# Patient Record
Sex: Female | Born: 1955
Health system: Southern US, Community
[De-identification: ages and names within clinical notes are randomized; demographics above are authoritative.]

## PROBLEM LIST (undated history)

## (undated) ENCOUNTER — Emergency Department (HOSPITAL_COMMUNITY): Admission: EM | Payer: BC Managed Care – PPO | Source: Home / Self Care

## (undated) DIAGNOSIS — I2510 Atherosclerotic heart disease of native coronary artery without angina pectoris: Secondary | ICD-10-CM

## (undated) DIAGNOSIS — I219 Acute myocardial infarction, unspecified: Secondary | ICD-10-CM

## (undated) DIAGNOSIS — I2699 Other pulmonary embolism without acute cor pulmonale: Secondary | ICD-10-CM

## (undated) DIAGNOSIS — I10 Essential (primary) hypertension: Secondary | ICD-10-CM

## (undated) DIAGNOSIS — I214 Non-ST elevation (NSTEMI) myocardial infarction: Secondary | ICD-10-CM

## (undated) DIAGNOSIS — D6862 Lupus anticoagulant syndrome: Secondary | ICD-10-CM

## (undated) DIAGNOSIS — M549 Dorsalgia, unspecified: Secondary | ICD-10-CM

## (undated) DIAGNOSIS — I82409 Acute embolism and thrombosis of unspecified deep veins of unspecified lower extremity: Secondary | ICD-10-CM

## (undated) HISTORY — PX: BACK SURGERY: SHX140

## (undated) HISTORY — DX: Acute myocardial infarction, unspecified: I21.9

---
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
## 2002-05-08 ENCOUNTER — Encounter: Payer: Self-pay | Admitting: Family Medicine

## 2009-08-16 ENCOUNTER — Encounter: Payer: Self-pay | Admitting: Family Medicine

## 2009-09-06 ENCOUNTER — Encounter
Admission: RE | Admit: 2009-09-06 | Discharge: 2009-12-05 | Payer: Self-pay | Admitting: Physical Medicine and Rehabilitation

## 2010-03-21 ENCOUNTER — Ambulatory Visit (HOSPITAL_COMMUNITY): Admission: RE | Admit: 2010-03-21 | Discharge: 2010-03-21 | Payer: Self-pay | Admitting: Ophthalmology

## 2010-11-01 LAB — BASIC METABOLIC PANEL
BUN: 8 mg/dL (ref 6–23)
CO2: 25 mEq/L (ref 19–32)
Calcium: 9.6 mg/dL (ref 8.4–10.5)
Chloride: 105 mEq/L (ref 96–112)
Creatinine, Ser: 0.55 mg/dL (ref 0.4–1.2)
GFR calc Af Amer: >60 mL/min (ref >60)
GFR calc non Af Amer: >60 mL/min (ref >60)
Glucose, Bld: 98 mg/dL (ref 70–99)
Potassium: 3.3 mEq/L — ABNORMAL LOW (ref 3.5–5.1)
Sodium: 137 mEq/L (ref 135–145)

## 2010-11-01 LAB — HEMOGLOBIN AND HEMATOCRIT, BLOOD
HCT: 36.8 % (ref 36.0–46.0)
Hemoglobin: 12.9 g/dL (ref 12.0–15.0)

## 2011-06-11 ENCOUNTER — Ambulatory Visit: Payer: BC Managed Care – PPO | Attending: Physical Medicine and Rehabilitation | Admitting: Physical Therapy

## 2011-06-11 DIAGNOSIS — M545 Low back pain, unspecified: Secondary | ICD-10-CM | POA: Insufficient documentation

## 2011-06-11 DIAGNOSIS — IMO0001 Reserved for inherently not codable concepts without codable children: Secondary | ICD-10-CM | POA: Insufficient documentation

## 2011-06-11 DIAGNOSIS — R5381 Other malaise: Secondary | ICD-10-CM | POA: Insufficient documentation

## 2011-06-16 ENCOUNTER — Ambulatory Visit: Payer: BC Managed Care – PPO | Admitting: Physical Therapy

## 2011-06-18 ENCOUNTER — Encounter: Payer: BC Managed Care – PPO | Admitting: Physical Therapy

## 2012-04-20 ENCOUNTER — Encounter (HOSPITAL_COMMUNITY): Payer: Self-pay | Admitting: Emergency Medicine

## 2012-04-20 ENCOUNTER — Emergency Department (HOSPITAL_COMMUNITY): Payer: BC Managed Care – PPO

## 2012-04-20 ENCOUNTER — Inpatient Hospital Stay (HOSPITAL_COMMUNITY)
Admission: EM | Admit: 2012-04-20 | Discharge: 2012-04-23 | DRG: 543 | Disposition: A | Discharge status: Home / Self Care | Payer: BC Managed Care – PPO | Attending: Internal Medicine | Admitting: Internal Medicine

## 2012-04-20 DIAGNOSIS — I2699 Other pulmonary embolism without acute cor pulmonale: Secondary | ICD-10-CM

## 2012-04-20 DIAGNOSIS — E860 Dehydration: Secondary | ICD-10-CM | POA: Diagnosis present

## 2012-04-20 DIAGNOSIS — I498 Other specified cardiac arrhythmias: Secondary | ICD-10-CM | POA: Diagnosis present

## 2012-04-20 DIAGNOSIS — R000 Tachycardia, unspecified: Secondary | ICD-10-CM | POA: Diagnosis present

## 2012-04-20 DIAGNOSIS — D649 Anemia, unspecified: Secondary | ICD-10-CM

## 2012-04-20 DIAGNOSIS — M7989 Other specified soft tissue disorders: Secondary | ICD-10-CM

## 2012-04-20 DIAGNOSIS — Z9889 Other specified postprocedural states: Secondary | ICD-10-CM

## 2012-04-20 DIAGNOSIS — I10 Essential (primary) hypertension: Secondary | ICD-10-CM | POA: Diagnosis present

## 2012-04-20 DIAGNOSIS — I824Z9 Acute embolism and thrombosis of unspecified deep veins of unspecified distal lower extremity: Principal | ICD-10-CM | POA: Diagnosis present

## 2012-04-20 DIAGNOSIS — F17200 Nicotine dependence, unspecified, uncomplicated: Secondary | ICD-10-CM | POA: Diagnosis present

## 2012-04-20 DIAGNOSIS — I82409 Acute embolism and thrombosis of unspecified deep veins of unspecified lower extremity: Secondary | ICD-10-CM

## 2012-04-20 DIAGNOSIS — E871 Hypo-osmolality and hyponatremia: Secondary | ICD-10-CM | POA: Diagnosis present

## 2012-04-20 DIAGNOSIS — Z79899 Other long term (current) drug therapy: Secondary | ICD-10-CM

## 2012-04-20 DIAGNOSIS — E876 Hypokalemia: Secondary | ICD-10-CM

## 2012-04-20 DIAGNOSIS — M79609 Pain in unspecified limb: Secondary | ICD-10-CM

## 2012-04-20 DIAGNOSIS — Z720 Tobacco use: Secondary | ICD-10-CM | POA: Diagnosis present

## 2012-04-20 DIAGNOSIS — D72829 Elevated white blood cell count, unspecified: Secondary | ICD-10-CM | POA: Diagnosis present

## 2012-04-20 DIAGNOSIS — N179 Acute kidney failure, unspecified: Secondary | ICD-10-CM

## 2012-04-20 LAB — GLUCOSE, CAPILLARY: Glucose-Capillary: 103 mg/dL — ABNORMAL HIGH (ref 70–99)

## 2012-04-20 LAB — BASIC METABOLIC PANEL
BUN: 14 mg/dL (ref 6–23)
CO2: 28 mEq/L (ref 19–32)
Calcium: 9.2 mg/dL (ref 8.4–10.5)
Chloride: 84 mEq/L — ABNORMAL LOW (ref 96–112)
Creatinine, Ser: 1.5 mg/dL — ABNORMAL HIGH (ref 0.50–1.10)
GFR calc Af Amer: 44 mL/min — ABNORMAL LOW (ref >90)
GFR calc non Af Amer: 38 mL/min — ABNORMAL LOW (ref >90)
Glucose, Bld: 128 mg/dL — ABNORMAL HIGH (ref 70–99)
Potassium: 2.8 mEq/L — ABNORMAL LOW (ref 3.5–5.1)
Sodium: 126 mEq/L — ABNORMAL LOW (ref 135–145)

## 2012-04-20 LAB — CBC WITH DIFFERENTIAL/PLATELET
Basophils Absolute: 0 10*3/uL (ref 0.0–0.1)
Basophils Relative: 0 % (ref 0–1)
Eosinophils Absolute: 0 10*3/uL (ref 0.0–0.7)
Eosinophils Relative: 0 % (ref 0–5)
HCT: 26 % — ABNORMAL LOW (ref 36.0–46.0)
Hemoglobin: 9 g/dL — ABNORMAL LOW (ref 12.0–15.0)
Lymphocytes Relative: 6 % — ABNORMAL LOW (ref 12–46)
Lymphs Abs: 1.2 10*3/uL (ref 0.7–4.0)
MCH: 29.5 pg (ref 26.0–34.0)
MCHC: 34.6 g/dL (ref 30.0–36.0)
MCV: 85.2 fL (ref 78.0–100.0)
Monocytes Absolute: 1.8 10*3/uL — ABNORMAL HIGH (ref 0.1–1.0)
Monocytes Relative: 8 % (ref 3–12)
Neutro Abs: 18.6 10*3/uL — ABNORMAL HIGH (ref 1.7–7.7)
Neutrophils Relative %: 86 % — ABNORMAL HIGH (ref 43–77)
Platelets: 592 10*3/uL — ABNORMAL HIGH (ref 150–400)
RBC: 3.05 MIL/uL — ABNORMAL LOW (ref 3.87–5.11)
RDW: 12.9 % (ref 11.5–15.5)
WBC: 21.6 10*3/uL — ABNORMAL HIGH (ref 4.0–10.5)

## 2012-04-20 LAB — PROTIME-INR
INR: 1.29 (ref 0.00–1.49)
Prothrombin Time: 16.3 seconds — ABNORMAL HIGH (ref 11.6–15.2)

## 2012-04-20 LAB — MRSA PCR SCREENING: MRSA by PCR: NEGATIVE

## 2012-04-20 MED ORDER — SODIUM CHLORIDE 0.9 % IV SOLN
INTRAVENOUS | Status: AC
  Administered 2012-04-20: 23:00:00 via INTRAVENOUS

## 2012-04-20 MED ORDER — SODIUM CHLORIDE 0.9 % IV BOLUS (SEPSIS)
1000.0000 mL | Freq: Once | INTRAVENOUS | Status: AC
  Administered 2012-04-20: 1000 mL via INTRAVENOUS

## 2012-04-20 MED ORDER — OXYCODONE HCL 5 MG PO TABS
5.0000 mg | ORAL_TABLET | ORAL | Status: DC | PRN
  Administered 2012-04-20 – 2012-04-22 (×6): 5 mg via ORAL
  Filled 2012-04-20 (×6): qty 1

## 2012-04-20 MED ORDER — HEPARIN BOLUS VIA INFUSION
4000.0000 [IU] | Freq: Once | INTRAVENOUS | Status: AC
  Administered 2012-04-20: 4000 [IU] via INTRAVENOUS

## 2012-04-20 MED ORDER — SODIUM CHLORIDE 0.9 % IV SOLN
INTRAVENOUS | Status: DC
  Administered 2012-04-20 – 2012-04-22 (×6): via INTRAVENOUS

## 2012-04-20 MED ORDER — IOHEXOL 350 MG/ML SOLN
80.0000 mL | Freq: Once | INTRAVENOUS | Status: AC | PRN
  Administered 2012-04-20: 80 mL via INTRAVENOUS

## 2012-04-20 MED ORDER — ACETAMINOPHEN 650 MG RE SUPP: 650.0000 mg | Freq: Four times a day (QID) | RECTAL | Status: DC | PRN

## 2012-04-20 MED ORDER — ONDANSETRON HCL 4 MG/2ML IJ SOLN: 4.0000 mg | Freq: Four times a day (QID) | INTRAMUSCULAR | Status: DC | PRN

## 2012-04-20 MED ORDER — OXYCODONE HCL 5 MG PO CAPS: 5.0000 mg | ORAL_CAPSULE | ORAL | Status: DC | PRN

## 2012-04-20 MED ORDER — SODIUM CHLORIDE 0.9 % IJ SOLN
3.0000 mL | Freq: Two times a day (BID) | INTRAMUSCULAR | Status: DC
  Administered 2012-04-20 – 2012-04-22 (×3): 3 mL via INTRAVENOUS

## 2012-04-20 MED ORDER — HEPARIN (PORCINE) IN NACL 100-0.45 UNIT/ML-% IJ SOLN
1700.0000 [IU]/h | INTRAMUSCULAR | Status: DC
  Administered 2012-04-20: 1100 [IU]/h via INTRAVENOUS
  Administered 2012-04-21: 1400 [IU]/h via INTRAVENOUS
  Administered 2012-04-21: 1700 [IU]/h via INTRAVENOUS
  Filled 2012-04-20 (×3): qty 250

## 2012-04-20 MED ORDER — SENNOSIDES-DOCUSATE SODIUM 8.6-50 MG PO TABS
2.0000 | ORAL_TABLET | Freq: Every day | ORAL | Status: DC
  Administered 2012-04-20 – 2012-04-22 (×3): 2 via ORAL
  Filled 2012-04-20 (×5): qty 2

## 2012-04-20 MED ORDER — CYCLOBENZAPRINE HCL 10 MG PO TABS
10.0000 mg | ORAL_TABLET | Freq: Three times a day (TID) | ORAL | Status: DC
  Administered 2012-04-20 – 2012-04-23 (×9): 10 mg via ORAL
  Filled 2012-04-20 (×11): qty 1

## 2012-04-20 MED ORDER — POTASSIUM CHLORIDE 10 MEQ/100ML IV SOLN
10.0000 meq | Freq: Once | INTRAVENOUS | Status: AC
  Administered 2012-04-20: 10 meq via INTRAVENOUS
  Filled 2012-04-20: qty 100

## 2012-04-20 MED ORDER — POTASSIUM CHLORIDE CRYS ER 20 MEQ PO TBCR
40.0000 meq | EXTENDED_RELEASE_TABLET | Freq: Once | ORAL | Status: AC
  Administered 2012-04-20: 40 meq via ORAL
  Filled 2012-04-20: qty 2

## 2012-04-20 MED ORDER — POTASSIUM CHLORIDE 10 MEQ/100ML IV SOLN
10.0000 meq | INTRAVENOUS | Status: AC
  Administered 2012-04-20 – 2012-04-21 (×3): 10 meq via INTRAVENOUS
  Filled 2012-04-20 (×3): qty 100

## 2012-04-20 MED ORDER — ACETAMINOPHEN 325 MG PO TABS: 650.0000 mg | ORAL_TABLET | Freq: Four times a day (QID) | ORAL | Status: DC | PRN

## 2012-04-20 MED ORDER — ONDANSETRON HCL 4 MG PO TABS: 4.0000 mg | ORAL_TABLET | Freq: Four times a day (QID) | ORAL | Status: DC | PRN

## 2012-04-20 MED ORDER — SENNA-DOCUSATE SODIUM 8.6-50 MG PO TABS: 2.0000 | ORAL_TABLET | Freq: Every evening | ORAL | Status: DC

## 2012-04-20 NOTE — H&P (Signed)
[name] [name] is an 56 y.o. female.  Patient was seen and examined on April 20, 2012 at 10 PM. PCP - Dr. Rudi Heap in [place].  Chief Complaint: Left lower extremity swelling. HPI: 56 year old female and history of ongoing tobacco abuse and hypertension who had recent back surgery at [facility] on April 05, 2012 and had her staples removed today was found to have left lower extremity edema and was told to come to the ER. In the ER doc as confirmed DVT and since patient had tachycardia CT angiogram was done which showed pulmonary embolism. Patient is admitted for further workup. In addition patient is also found to be hyponatremic and hypokalemic with increased creatinine. Patient states she has not been eating well for last few days since the surgery but denies any nausea vomiting abdominal pain or diarrhea. Since her surgery she has been ambulating less. Denies any hormone use or any family history of PE or DVT. Patient initially was found to be hypotensive but soon became normotensive with fluids.  Past Medical History  Diagnosis Date  . No pertinent past medical history     Past Surgical History  Procedure Date  . Back surgery     Family History  Problem Relation Age of Onset  . Leukemia Mother   . Prostate cancer Father    Social History:  reports that she has been smoking.  She does not have any smokeless tobacco history on file. She reports that she drinks alcohol. She reports that she does not use illicit drugs.  Allergies:  Allergies  Allergen Reactions  . Prednisone Other (See Comments)    No energy "stalls out"  . Tramadol Other (See Comments)    constipation    Medications Prior to Admission  Medication Sig Dispense Refill  . cyclobenzaprine (FLEXERIL) 10 MG tablet Take 10 mg by mouth 3 (three) times daily.      Marland Kitchen lisinopril-hydrochlorothiazide (PRINZIDE,ZESTORETIC) 20-12.5 MG per tablet Take 0.5 tablets by mouth daily.      Marland Kitchen oxycodone (OXY-IR) 5 MG  capsule Take 5 mg by mouth every 4 (four) hours as needed. For pain      . sennosides-docusate sodium (SENOKOT-S) 8.6-50 MG tablet Take 2 tablets by mouth every evening.        Results for orders placed during the hospital encounter of 04/20/12 (from the past 48 hour(s))  CBC WITH DIFFERENTIAL     Status: Abnormal   Collection Time   04/20/12  4:48 PM      Component Value Range Comment   WBC 21.6 (*) 4.0 - 10.5 K/uL    RBC 3.05 (*) 3.87 - 5.11 MIL/uL    Hemoglobin 9.0 (*) 12.0 - 15.0 g/dL    HCT 14.7 (*) 82.9 - 46.0 %    MCV 85.2  78.0 - 100.0 fL    MCH 29.5  26.0 - 34.0 pg    MCHC 34.6  30.0 - 36.0 g/dL    RDW 56.2  13.0 - 86.5 %    Platelets 592 (*) 150 - 400 K/uL    Neutrophils Relative 86 (*) 43 - 77 %    Neutro Abs 18.6 (*) 1.7 - 7.7 K/uL    Lymphocytes Relative 6 (*) 12 - 46 %    Lymphs Abs 1.2  0.7 - 4.0 K/uL    Monocytes Relative 8  3 - 12 %    Monocytes Absolute 1.8 (*) 0.1 - 1.0 K/uL    Eosinophils Relative 0  0 - 5 %  Eosinophils Absolute 0.0  0.0 - 0.7 K/uL    Basophils Relative 0  0 - 1 %    Basophils Absolute 0.0  0.0 - 0.1 K/uL   BASIC METABOLIC PANEL     Status: Abnormal   Collection Time   04/20/12  4:48 PM      Component Value Range Comment   Sodium 126 (*) 135 - 145 mEq/L    Potassium 2.8 (*) 3.5 - 5.1 mEq/L    Chloride 84 (*) 96 - 112 mEq/L    CO2 28  19 - 32 mEq/L    Glucose, Bld 128 (*) 70 - 99 mg/dL    BUN 14  6 - 23 mg/dL    Creatinine, Ser 7.84 (*) 0.50 - 1.10 mg/dL    Calcium 9.2  8.4 - 69.6 mg/dL    GFR calc non Af Amer 38 (*) >90 mL/min    GFR calc Af Amer 44 (*) >90 mL/min   PROTIME-INR     Status: Abnormal   Collection Time   04/20/12  4:48 PM      Component Value Range Comment   Prothrombin Time 16.3 (*) 11.6 - 15.2 seconds    INR 1.29  0.00 - 1.49   GLUCOSE, CAPILLARY     Status: Abnormal   Collection Time   04/20/12 10:09 PM      Component Value Range Comment   Glucose-Capillary 103 (*) 70 - 99 mg/dL    Ct Angio Chest Pe W/cm &/or Wo  Cm  04/20/2012  *RADIOLOGY REPORT*  Clinical Data: Lower extremity swelling and hypertension status post recent surgery. Known DVT on reported Doppler ultrasound.  CT ANGIOGRAPHY CHEST  Technique:  Multidetector CT imaging of the chest using the standard protocol during bolus administration of intravenous contrast. Multiplanar reconstructed images including MIPs were obtained and reviewed to evaluate the vascular anatomy.  Contrast: 80mL OMNIPAQUE IOHEXOL 350 MG/ML SOLN  Comparison: None available.  Findings: The pulmonary arteries are well opacified with contrast. The study is positive for acute bilateral pulmonary emboli.  The emboli are slightly larger and more extensive on the left.  The central pulmonary arteries are patent. There is no evidence of right ventricular strain.  The thoracic aorta appears normal.  There is no mediastinal or hilar adenopathy.  There is no pleural or pericardial effusion.  Mild dependent atelectasis is present in both lungs.  There is no confluent airspace opacity.  There are no acute osseous findings.  Visualized upper abdomen appears unremarkable.  IMPRESSION:  1.  Study is positive for moderate acute pulmonary embolism bilaterally. 2.  Mild atelectasis in both lungs. 3.  No pleural effusion or confluent airspace opacity.  Critical Value/emergent results were called by telephone at the time of interpretation on 04/20/2012 at 1930 hours to Dr. Patria Mane, who verbally acknowledged these results.   Original Report Authenticated By: Gerrianne Scale, M.D.     Review of Systems  Constitutional: Negative.   HENT: Negative.   Eyes: Negative.   Respiratory: Negative.   Cardiovascular: Negative.   Gastrointestinal: Negative.   Genitourinary: Negative.   Musculoskeletal:       Lower extremity edema.  Skin: Negative.   Neurological: Negative.   Endo/Heme/Allergies: Negative.   Psychiatric/Behavioral: Negative.     Blood pressure 116/67, pulse 102, temperature 98.3 F (36.8  C), temperature source Oral, resp. rate 14, height 5' 4.5" (1.638 m), weight 72.576 kg (160 lb), SpO2 93.00%. Physical Exam  Constitutional: She is oriented to person, place, and  time. She appears well-developed and well-nourished. No distress.  HENT:  Head: Normocephalic and atraumatic.  Eyes: Conjunctivae are normal. Pupils are equal, round, and reactive to light. Right eye exhibits no discharge. Left eye exhibits no discharge. No scleral icterus.  Neck: Normal range of motion. Neck supple.  Cardiovascular:       Sinus tachycardia.  Respiratory: Effort normal and breath sounds normal. No respiratory distress. She has no wheezes. She has no rales.  GI: Soft. Bowel sounds are normal. She exhibits no distension. There is no tenderness. There is no rebound.  Musculoskeletal: She exhibits edema (Left lower extremity swelling.).  Neurological: She is alert and oriented to person, place, and time.       Moves all extremities.  Skin: She is not diaphoretic.     Assessment/Plan #1. Pulmonary embolism with DVT - continue IV heparin for now. Once hemodynamic stability is confirmed may transition to xarelto. Check 2-D echo. Since patient presented with hypotension patient will be closely monitored in step down. I did discuss with pulmonary critical care Dr. Delford Field. #2. Hyponatremia and hypokalemia with mild renal insufficiency - probably from poor oral intake and being on diuretic. Discontinue diuretics. Gently hydrate and replace potassium. Recheck metabolic panel in a.m. Check urinalysis. #3. History of hypertension - present mildly hypotensive so hold off antihypertensives. #4. Leukocytosis - patient is afebrile. Closely follow CBC for WBC trends. Check lactic acid and procalcitonin levels and cultures. UA is pending. #5. Anemia - follow CBC and if there is no significant fall in hemoglobin further workup as outpatient. #6. Tobacco abuse - strongly advised to quit.  CODE STATUS - full  code.  Eduard Clos. 04/20/2012, 10:15 PM

## 2012-04-20 NOTE — ED Notes (Addendum)
2 weeks post op back surgery, went to Dr. Isidore Moos today to have staples removed and noticed left lower extremity swelling and pain. Sent here for further workup. Pt hypotensive at triage. Alert and orientd. Reports dizziness that began today worse with changing positions.

## 2012-04-20 NOTE — Progress Notes (Signed)
*  Preliminary Results* Bilateral lower extremity venous duplex completed. Left lower extremity is positive for extensive acute deep vein thrombosis involving the left saphenofemoral junction, common femoral vein, femoral vein, popliteal vein and posterior tibial vein. There is no obvious evidence of deep vein thrombosis of the right leg. Preliminary results discussed with RN Janett Billow, she will inform MD.  04/20/2012 6:59 PM Gertie Fey, RDMS, RDCS

## 2012-04-20 NOTE — ED Provider Notes (Signed)
History     CSN: [id number]  Arrival date & time 04/20/12  1613   First MD Initiated Contact with Patient 04/20/12 1716      Chief Complaint  Patient presents with  . Leg Pain  . Hypotension    (Consider location/radiation/quality/duration/timing/severity/associated sxs/prior treatment) The history is provided by the patient.   the patient reports she had posterior lumbar fusion 2 and half weeks ago and now presents the emergency apartment with left lower leg swelling.  Her procedure was performed at [facility].  She denies chest pain or shortness of breath.  Her surgery was approximately 2 and half weeks ago.  She gets in to the emergency department to evaluate for deep vein thromboses.  On arrival to the emergency department her blood pressure was 80 systolic and she was tachycardic.  She vehemently denies chest pain shortness of breath or palpitation  History reviewed. No pertinent past medical history.  History reviewed. No pertinent past surgical history.  History reviewed. No pertinent family history.  History  Substance Use Topics  . Smoking status: Current Everyday Smoker  . Smokeless tobacco: Not on file  . Alcohol Use: Yes     occ    OB History    Grav Para Term Preterm Abortions TAB SAB Ect Mult Living                  Review of Systems  All other systems reviewed and are negative.    Allergies  Prednisone and Tramadol  Home Medications   Current Outpatient Rx  Name Route Sig Dispense Refill  . CYCLOBENZAPRINE HCL 10 MG PO TABS Oral Take 10 mg by mouth 3 (three) times daily.    Marland Kitchen LISINOPRIL-HYDROCHLOROTHIAZIDE 20-12.5 MG PO TABS Oral Take 0.5 tablets by mouth daily.    . OXYCODONE HCL 5 MG PO CAPS Oral Take 5 mg by mouth every 4 (four) hours as needed. For pain    . SENNA-DOCUSATE SODIUM 8.6-50 MG PO TABS Oral Take 2 tablets by mouth every evening.      BP 118/54  Pulse 102  Temp 98.3 F (36.8 C) (Oral)  Resp 17  Ht 5' 4.5" (1.638 m)   Wt 160 lb (72.576 kg)  BMI 27.04 kg/m2  SpO2 100%  Physical Exam  Nursing note and vitals reviewed. Constitutional: She is oriented to person, place, and time. She appears well-developed and well-nourished. No distress.  HENT:  Head: Normocephalic and atraumatic.  Eyes: EOM are normal.  Neck: Normal range of motion.  Cardiovascular: Normal rate, regular rhythm and normal heart sounds.   Pulmonary/Chest: Effort normal and breath sounds normal.  Abdominal: Soft. She exhibits no distension. There is no tenderness.  Musculoskeletal:       Patient with evidence of left lower leg swelling from her left foot all the way to her proximal left femur.  Normal pulses in her left foot.  No signs of cellulitis.  Neurological: She is alert and oriented to person, place, and time.  Skin: Skin is warm and dry.  Psychiatric: She has a normal mood and affect. Judgment normal.    ED Course  Procedures (including critical care time)  Labs Reviewed  CBC WITH DIFFERENTIAL - Abnormal; Notable for the following:    WBC 21.6 (*)     RBC 3.05 (*)     Hemoglobin 9.0 (*)     HCT 26.0 (*)     Platelets 592 (*)     Neutrophils Relative 86 (*)  Neutro Abs 18.6 (*)     Lymphocytes Relative 6 (*)     Monocytes Absolute 1.8 (*)     All other components within normal limits  BASIC METABOLIC PANEL - Abnormal; Notable for the following:    Sodium 126 (*)     Potassium 2.8 (*)     Chloride 84 (*)     Glucose, Bld 128 (*)     Creatinine, Ser 1.50 (*)     GFR calc non Af Amer 38 (*)     GFR calc Af Amer 44 (*)     All other components within normal limits  PROTIME-INR - Abnormal; Notable for the following:    Prothrombin Time 16.3 (*)     All other components within normal limits  HEPARIN LEVEL (UNFRACTIONATED)  HEPARIN LEVEL (UNFRACTIONATED)  CBC  CULTURE, BLOOD (ROUTINE X 2)  CULTURE, BLOOD (ROUTINE X 2)   Ct Angio Chest Pe W/cm &/or Wo Cm  04/20/2012  *RADIOLOGY REPORT*  Clinical Data: Lower  extremity swelling and hypertension status post recent surgery. Known DVT on reported Doppler ultrasound.  CT ANGIOGRAPHY CHEST  Technique:  Multidetector CT imaging of the chest using the standard protocol during bolus administration of intravenous contrast. Multiplanar reconstructed images including MIPs were obtained and reviewed to evaluate the vascular anatomy.  Contrast: 80mL OMNIPAQUE IOHEXOL 350 MG/ML SOLN  Comparison: None available.  Findings: The pulmonary arteries are well opacified with contrast. The study is positive for acute bilateral pulmonary emboli.  The emboli are slightly larger and more extensive on the left.  The central pulmonary arteries are patent. There is no evidence of right ventricular strain.  The thoracic aorta appears normal.  There is no mediastinal or hilar adenopathy.  There is no pleural or pericardial effusion.  Mild dependent atelectasis is present in both lungs.  There is no confluent airspace opacity.  There are no acute osseous findings.  Visualized upper abdomen appears unremarkable.  IMPRESSION:  1.  Study is positive for moderate acute pulmonary embolism bilaterally. 2.  Mild atelectasis in both lungs. 3.  No pleural effusion or confluent airspace opacity.  Critical Value/emergent results were called by telephone at the time of interpretation on 04/20/2012 at 1930 hours to Dr. Patria Mane, who verbally acknowledged these results.   Original Report Authenticated By: Gerrianne Scale, M.D.     I personally reviewed the imaging tests through PACS system  I reviewed available ER/hospitalization records thought the EMR   1. Pulmonary embolus   2. DVT (deep venous thrombosis)       MDM  The patient was initiated on heparin on my arrival.  Patient has evidence of a deep vein thrombosis on bedside ultrasound as well as evidence of bilateral pulmonary emboli.  Her heart rate has improved with IV fluids as has it her hypotension.  The patient will be admitted to the step  down unit on the hospitalist service.        Lyanne Co, MD 04/20/12 2041

## 2012-04-20 NOTE — ED Provider Notes (Signed)
MSE was initiated and I personally evaluated the patient and placed orders (if any) at  4:41 PM on April 20, 2012.  Patient underwent laminectomy in August 19 by Dr. Wende Neighbors.  Yesterday she developed pain and swelling to her left lower extremity. She saw her PCP today and was referred to the ED. She's hypotensive but denies any chest pain, shortness of breath, dizziness or lightheadedness. She's intact distal pulses in her lower extremities with calf tenderness and asymmetrical swelling of the left side  The patient appears stable so that the remainder of the MSE may be completed by another provider.  Glynn Octave, MD 04/20/12 [phone]

## 2012-04-20 NOTE — Progress Notes (Signed)
ANTICOAGULATION CONSULT NOTE - Initial Consult  Pharmacy Consult for Heparin Indication: pulmonary embolus  Allergies  Allergen Reactions  . Prednisone Other (See Comments)    No energy "stalls out"  . Tramadol Other (See Comments)    constipation    Patient Measurements:  Ht 5'4.5" Stated wt 160 lb = 72.7 kg Heparin Dosing Weight: 70.6 kg  Vital Signs: Temp: 98.3 F (36.8 C) (09/03 1623) Temp src: Oral (09/03 1623) BP: 84/48 mmHg (09/03 1728) Pulse Rate: 101  (09/03 1728)  Labs:  Basename 04/20/12 1648  HGB 9.0*  HCT 26.0*  PLT 592*  APTT --  LABPROT 16.3*  INR 1.29  HEPARINUNFRC --  CREATININE --  CKTOTAL --  CKMB --  TROPONINI --    CrCl is unknown because there is no height on file for the current visit.   Medical History: History reviewed. No pertinent past medical history.   Assessment: 57 yo female with c/o of left leg pain and swelling for several days sent to the ED to r/o DVT. CT angio chest ordered. CrCl ~41 ml/min, Hgb 9.0, plt 592.   Goal of Therapy:  Heparin level 0.3-0.7 units/ml Monitor platelets by anticoagulation protocol: Yes   Plan:  Give 4000 units bolus x 1 Start heparin infusion at 1100 units/hr Heparin level in 6h - at midnight Daily heparin levels and CBC - follow up Hgb trend  Crist Fat L 04/20/2012,5:32 PM

## 2012-04-20 NOTE — ED Notes (Addendum)
Pt c/o left leg pain and swelling x several days; pt sent here to R/O DVT due to recent back sx; pt sts was hypotensive at PCP and is hypotensive here

## 2012-04-21 DIAGNOSIS — E871 Hypo-osmolality and hyponatremia: Secondary | ICD-10-CM

## 2012-04-21 DIAGNOSIS — R000 Tachycardia, unspecified: Secondary | ICD-10-CM | POA: Diagnosis present

## 2012-04-21 DIAGNOSIS — D72829 Elevated white blood cell count, unspecified: Secondary | ICD-10-CM | POA: Diagnosis present

## 2012-04-21 DIAGNOSIS — Z9889 Other specified postprocedural states: Secondary | ICD-10-CM

## 2012-04-21 LAB — COMPREHENSIVE METABOLIC PANEL
ALT: 9 U/L (ref 0–35)
AST: 15 U/L (ref 0–37)
Albumin: 1.7 g/dL — ABNORMAL LOW (ref 3.5–5.2)
Alkaline Phosphatase: 170 U/L — ABNORMAL HIGH (ref 39–117)
BUN: 9 mg/dL (ref 6–23)
CO2: 20 mEq/L (ref 19–32)
Calcium: 8.1 mg/dL — ABNORMAL LOW (ref 8.4–10.5)
Chloride: 99 mEq/L (ref 96–112)
Creatinine, Ser: 0.71 mg/dL (ref 0.50–1.10)
GFR calc Af Amer: >90 mL/min (ref >90)
GFR calc non Af Amer: >90 mL/min (ref >90)
Glucose, Bld: 85 mg/dL (ref 70–99)
Potassium: 2.8 mEq/L — ABNORMAL LOW (ref 3.5–5.1)
Sodium: 133 mEq/L — ABNORMAL LOW (ref 135–145)
Total Bilirubin: 0.3 mg/dL (ref 0.3–1.2)
Total Protein: 5.6 g/dL — ABNORMAL LOW (ref 6.0–8.3)

## 2012-04-21 LAB — URINE MICROSCOPIC-ADD ON

## 2012-04-21 LAB — CBC WITH DIFFERENTIAL/PLATELET
Basophils Absolute: 0 10*3/uL (ref 0.0–0.1)
Basophils Relative: 0 % (ref 0–1)
Eosinophils Absolute: 0.1 10*3/uL (ref 0.0–0.7)
Eosinophils Relative: 1 % (ref 0–5)
HCT: 21.8 % — ABNORMAL LOW (ref 36.0–46.0)
Hemoglobin: 7.4 g/dL — ABNORMAL LOW (ref 12.0–15.0)
Lymphocytes Relative: 10 % — ABNORMAL LOW (ref 12–46)
Lymphs Abs: 1.8 10*3/uL (ref 0.7–4.0)
MCH: 28.9 pg (ref 26.0–34.0)
MCHC: 33.9 g/dL (ref 30.0–36.0)
MCV: 85.2 fL (ref 78.0–100.0)
Monocytes Absolute: 1.5 10*3/uL — ABNORMAL HIGH (ref 0.1–1.0)
Monocytes Relative: 9 % (ref 3–12)
Neutro Abs: 14.3 10*3/uL — ABNORMAL HIGH (ref 1.7–7.7)
Neutrophils Relative %: 81 % — ABNORMAL HIGH (ref 43–77)
Platelets: 578 10*3/uL — ABNORMAL HIGH (ref 150–400)
RBC: 2.56 MIL/uL — ABNORMAL LOW (ref 3.87–5.11)
RDW: 12.9 % (ref 11.5–15.5)
WBC: 17.8 10*3/uL — ABNORMAL HIGH (ref 4.0–10.5)

## 2012-04-21 LAB — IRON AND TIBC
Iron: <10 ug/dL — ABNORMAL LOW (ref 42–135)
UIBC: 133 ug/dL (ref 125–400)

## 2012-04-21 LAB — TSH: TSH: 0.757 u[IU]/mL (ref 0.350–4.500)

## 2012-04-21 LAB — HEPARIN LEVEL (UNFRACTIONATED)
Heparin Unfractionated: <0.1 IU/mL — ABNORMAL LOW (ref 0.30–0.70)
Heparin Unfractionated: <0.1 IU/mL — ABNORMAL LOW (ref 0.30–0.70)
Heparin Unfractionated: <0.1 IU/mL — ABNORMAL LOW (ref 0.30–0.70)

## 2012-04-21 LAB — TROPONIN I
Troponin I: <0.3 ng/mL (ref <0.30)
Troponin I: <0.3 ng/mL (ref <0.30)
Troponin I: <0.3 ng/mL (ref <0.30)

## 2012-04-21 LAB — PROCALCITONIN: Procalcitonin: 0.43 ng/mL

## 2012-04-21 LAB — URINALYSIS, ROUTINE W REFLEX MICROSCOPIC
Bilirubin Urine: NEGATIVE
Glucose, UA: NEGATIVE mg/dL
Ketones, ur: NEGATIVE mg/dL
Nitrite: NEGATIVE
Protein, ur: NEGATIVE mg/dL
Specific Gravity, Urine: 1.024 (ref 1.005–1.030)
Urobilinogen, UA: 1 mg/dL (ref 0.0–1.0)
pH: 6 (ref 5.0–8.0)

## 2012-04-21 LAB — MAGNESIUM: Magnesium: 1.9 mg/dL (ref 1.5–2.5)

## 2012-04-21 LAB — RETICULOCYTES
RBC.: 2.57 MIL/uL — ABNORMAL LOW (ref 3.87–5.11)
Retic Count, Absolute: 23.1 10*3/uL (ref 19.0–186.0)
Retic Ct Pct: 0.9 % (ref 0.4–3.1)

## 2012-04-21 LAB — FOLATE: Folate: 9.8 ng/mL

## 2012-04-21 LAB — LACTIC ACID, PLASMA: Lactic Acid, Venous: 0.7 mmol/L (ref 0.5–2.2)

## 2012-04-21 LAB — PRO B NATRIURETIC PEPTIDE: Pro B Natriuretic peptide (BNP): 292.5 pg/mL — ABNORMAL HIGH (ref 0–125)

## 2012-04-21 LAB — VITAMIN B12: Vitamin B-12: 253 pg/mL (ref 211–911)

## 2012-04-21 LAB — FERRITIN: Ferritin: 275 ng/mL (ref 10–291)

## 2012-04-21 MED ORDER — CHLORHEXIDINE GLUCONATE 0.12 % MT SOLN: 15.0000 mL | Freq: Two times a day (BID) | OROMUCOSAL | Status: DC

## 2012-04-21 MED ORDER — POTASSIUM CHLORIDE CRYS ER 20 MEQ PO TBCR
40.0000 meq | EXTENDED_RELEASE_TABLET | Freq: Two times a day (BID) | ORAL | Status: DC
  Administered 2012-04-21 – 2012-04-22 (×2): 40 meq via ORAL
  Filled 2012-04-21 (×3): qty 2

## 2012-04-21 MED ORDER — HEPARIN BOLUS VIA INFUSION
2000.0000 [IU] | Freq: Once | INTRAVENOUS | Status: AC
  Administered 2012-04-21: 2000 [IU] via INTRAVENOUS
  Filled 2012-04-21: qty 2000

## 2012-04-21 MED ORDER — WARFARIN - PHARMACIST DOSING INPATIENT: Freq: Every day | Status: DC

## 2012-04-21 MED ORDER — HEPARIN (PORCINE) IN NACL 100-0.45 UNIT/ML-% IJ SOLN
2400.0000 [IU]/h | INTRAMUSCULAR | Status: DC
  Administered 2012-04-21: 2000 [IU]/h via INTRAVENOUS
  Administered 2012-04-22: 2400 [IU]/h via INTRAVENOUS
  Filled 2012-04-21 (×4): qty 250

## 2012-04-21 MED ORDER — PATIENT'S GUIDE TO USING COUMADIN BOOK
Freq: Once | Status: AC
  Administered 2012-04-21: 13:00:00
  Filled 2012-04-21: qty 1

## 2012-04-21 MED ORDER — WARFARIN VIDEO
Freq: Once | Status: AC
  Administered 2012-04-21: 13:00:00

## 2012-04-21 MED ORDER — BIOTENE DRY MOUTH MT LIQD: 15.0000 mL | Freq: Two times a day (BID) | OROMUCOSAL | Status: DC

## 2012-04-21 MED ORDER — WARFARIN SODIUM 7.5 MG PO TABS
7.5000 mg | ORAL_TABLET | Freq: Once | ORAL | Status: AC
  Administered 2012-04-21: 7.5 mg via ORAL
  Filled 2012-04-21: qty 1

## 2012-04-21 NOTE — Progress Notes (Signed)
Pt transferred to floor from 2100. Alert and oriented. C/O pain to L leg. Skin intact. VSS. Oriented to unit. Bed in low position. Call bell within reach. Informed pt to call us before getting out of bed. Will continue to monitor.

## 2012-04-21 NOTE — Plan of Care (Signed)
Problem: Consults Goal: Diagnosis - Venous Thromboembolism (VTE) Choose a selection  Outcome: Completed/Met Date Met:  04/21/12 L leg DVT

## 2012-04-21 NOTE — Progress Notes (Signed)
[facility] Progress Note [place] TEAM 1 - Stepdown/ICU TEAM   Nakaiya Beddow ZOX:[id number] DOB: 07/03/1956 DOA: 04/20/2012 PCP: No primary provider on file.  Brief narrative: 51 her old female with recent history of back surgery at [facility] on 04/05/2012. Was at the orthopedic physician's office having her staples removed when was noted to have left lower extremity edema and leg pain with ambulation. She was instructed to come to a [facility] emergency department. Workup in the ER did reveal a DVT in the left lower extremity. Because she was experiencing tachycardia CT angiogram was done which demonstrated bilateral PE with moderate clot burden. Patient was not hypoxemic but laboratory data did reveal hyponatremia and hypokalemia with increased creatinine consistent with acute renal failure and dehydration. Patient endorses to not eating well for a few days after the surgery but denied any other GI symptomatology. In addition because of left lower extremity pain she has been ambulating less. At presentation she was somewhat hypotensive with a systolic blood pressure in the 60s so was given IV fluid challenges in the emergency department and subsequently now is hemodynamically stable. She was subsequently admitted to the step down unit for further monitoring and treatment.  Assessment/Plan:  Pulmonary embolism, bilateral- moderate clot burden *Currently without hypoxemia *Hemodynamically stable and no evidence of RV strain on CT of the chest *Troponin I has been negative x2 *No indications for 2-D echocardiogram this admission *Continue anticoagulation-see below  DVT of left lower limb, acute *Extensive DVT extending from lower leg to the saphenous vein and common femoral vein *Continue IV heparin *Consideration was given to Xarelto but given recent back surgery and concerns for possible anticoagulation related hematoma it was felt that an agent that could be reversed i.e. heparin  and Coumadin would be more appropriate for this patient  ARF (acute renal failure)- baseline creatinine 0.55 *Continue IV fluids *Creatinine baseline 0.55  Hypokalemia Replace and follow - likely due to diuretic use at home  Sinus tachycardia *Likely related to acute pulmonary emboli but she endorses has history of chronic sinus tachycardia  Dehydration with hyponatremia/Hypokalemia *Improving with IV fluids *Continue to monitor electrolyte panel  Leukocytosis *An acute reactant due to stress from PE and DVT *Trend is downward and no evidence of fever or other infectious process including on CT of the chest  Anemia *Hemoglobin has dropped from 9.0-7.4 since admission the patient has also been given significant volume of fluid so this may be dilutional *Repeat CBC in the morning *Check an anemia panel *Baseline hemoglobin 12.9 in August of 2011  Tobacco abuse Counsel on need to d/c use completely   HTN (hypertension) *Blood pressure currently well controlled *Was on combination ACE inhibitor and thiazide diuretic prior to admission *Given presentation with dehydration and acute renal failure will continue to hold diuretic and reevaluate in the morning as to whether her ACE inhibitor can be resumed  History of back surgery *Currently no back pain and was recently evaluated by her doctor just prior to admission with removal of staples and no apparent evidence of infection *Patient endorses was having difficulty with weightbearing on the left leg prior to admission with the leg giving way and was utilizing a walker at home therefore we'll ask PT and OT to evaluate   DVT prophylaxis: Currently on full dose heparin Code Status: Full Family Communication: Spoke directly with patient Disposition Plan: Transfer to telemetry  Consultants: None  Procedures: None  Antibiotics: None  HPI/Subjective: Patient is alert and currently complaining of  pain while resting in the bed.  Had multiple questions regarding her new diagnoses. Attending physician thoroughly explained rationale for anticoagulation with anticipation of at least 6 months duration.   Objective: Blood pressure 113/57, pulse 103, temperature 99.4 F (37.4 C), temperature source Oral, resp. rate 28, height 5\' 4"  (1.626 m), weight 76.9 kg (169 lb 8.5 oz), SpO2 98.00%.  Intake/Output Summary (Last 24 hours) at 04/21/12 1128 Last data filed at 04/21/12 1000  Gross per 24 hour  Intake 2322.95 ml  Output   1225 ml  Net 1097.95 ml     Exam: General: No acute respiratory distress, appears stated age Lungs: Clear to auscultation bilaterally without wheezes or crackles Cardiovascular: Regular rate and rhythm without murmur gallop or rub normal S1 and S2, has focal left lower extremity edema extending from ankle to just above the knee Abdomen: Nontender, nondistended, soft, bowel sounds positive, no rebound, no ascites, no appreciable mass Musculoskeletal: No significant cyanosis, clubbing of extremities, lower extremities asymmetrical due to the edema but there is no joint effusion or erythema Neurological: Alert and oriented x3 exam nonfocal  Data Reviewed: Basic Metabolic Panel:  Lab 04/21/12 1308 04/21/12 0700 04/20/12 1648  NA 133* -- 126*  K 2.8* -- 2.8*  CL 99 -- 84*  CO2 20 -- 28  GLUCOSE 85 -- 128*  BUN 9 -- 14  CREATININE 0.71 -- 1.50*  CALCIUM 8.1* -- 9.2  MG -- 1.9 --  PHOS -- -- --   Liver Function Tests:  Lab 04/21/12 0730  AST 15  ALT 9  ALKPHOS 170*  BILITOT 0.3  PROT 5.6*  ALBUMIN 1.7*   CBC:  Lab 04/21/12 0730 04/20/12 1648  WBC 17.8* 21.6*  NEUTROABS 14.3* 18.6*  HGB 7.4* 9.0*  HCT 21.8* 26.0*  MCV 85.2 85.2  PLT 578* 592*   Cardiac Enzymes:  Lab 04/21/12 0700 04/21/12 0106  CKTOTAL -- --  CKMB -- --  CKMBINDEX -- --  TROPONINI <0.30 <0.30   BNP (last 3 results)  Basename 04/21/12 0106  PROBNP 292.5*   CBG:  Lab 04/20/12 2209  GLUCAP 103*     Recent Results (from the past 240 hour(s))  MRSA PCR SCREENING     Status: Normal   Collection Time   04/20/12 10:11 PM      Component Value Range Status Comment   MRSA by PCR NEGATIVE  NEGATIVE Final      Studies:  Recent x-ray studies have been reviewed in detail by the Attending Physician  Scheduled Meds:  Reviewed in detail by the Attending Physician   Junious Silk, ANP [facility] Office  [phone] Pager [phone]  On-Call/Text Page:      [email or web address]      password [email or web address]  If 7PM-7AM, please contact night-coverage [email or web address] Password [email or web address] 04/21/2012, 11:28 AM   LOS: 1 day   I have personally examined this patient and reviewed the entire database. I have reviewed the above note, made any necessary editorial changes, and agree with its content.  Lonia Blood, MD [facility]

## 2012-04-21 NOTE — Progress Notes (Addendum)
ANTICOAGULATION CONSULT NOTE - Initial Consult  Pharmacy Consult:  Coumadin / Heparin (Overlap D#1/5) Indication:  New DVT and bilateral PEs  Allergies  Allergen Reactions  . Prednisone Other (See Comments)    No energy "stalls out"  . Tramadol Other (See Comments)    constipation    Patient Measurements: Height: 5\' 4"  (162.6 cm) Weight: 169 lb 8.5 oz (76.9 kg) IBW/kg (Calculated) : 54.7   Vital Signs: Temp: 99.2 F (37.3 C) (09/04 1130) Temp src: Oral (09/04 0752) BP: 113/55 mmHg (09/04 1130) Pulse Rate: 108  (09/04 1130)  Labs:  Basename 04/21/12 0730 04/21/12 0712 04/21/12 0700 04/21/12 0130 04/21/12 0106 04/20/12 1648  HGB 7.4* -- -- -- -- 9.0*  HCT 21.8* -- -- -- -- 26.0*  PLT 578* -- -- -- -- 592*  APTT -- -- -- -- -- --  LABPROT -- -- -- -- -- 16.3*  INR -- -- -- -- -- 1.29  HEPARINUNFRC -- <0.10* -- <0.10* -- --  CREATININE 0.71 -- -- -- -- 1.50*  CKTOTAL -- -- -- -- -- --  CKMB -- -- -- -- -- --  TROPONINI -- -- <0.30 -- <0.30 --    Estimated Creatinine Clearance: 78.8 ml/min (by C-G formula based on Cr of 0.71).   Medical History: Past Medical History  Diagnosis Date  . No pertinent past medical history       Assessment: 67 YOF with new DVT and bilateral PEs to continue on IV heparin and add Coumadin as patient is hemodynamically stable.  INR 1.29 on 04/20/12; no bleeding reported.   Goal of Therapy:  INR 2-3 Monitor platelets by anticoagulation protocol: Yes    Plan:  - Coumadin 7.5mg  PO today - Daily PT / INR - Coumadin book / video    Madisynn Plair D. Laney Potash, PharmD, BCPS Pager:  [phone] 04/21/2012, 12:53 PM    Addendum: HL drawn one hour early though it remains undetectable.  RN reports no complications with infusion and IV site is intact.  No bleeding reported although hemoglobin did drop to 7.4 from 9 (may be dilutional).  Noted low iron level.  Plan - Rebolus with 2000 units IV x 1, then - Increase gtt to 2000 units/hr - Check HL in  6 hrs - Continue to monitor CBC closely - F/U with iron supplementation.    TRUE Shackleford D. Laney Potash, PharmD, BCPS Pager:  [phone] 04/21/2012, 3:51 PM

## 2012-04-21 NOTE — Progress Notes (Signed)
ANTICOAGULATION CONSULT NOTE - Follow Up Consult  Pharmacy Consult for heparin Indication: new PE/DVT  Patient Measurements:  Ht 5'4.5"  Stated wt 160 lb = 72.7 kg  Heparin Dosing Weight: 70.6 kg  Labs:  Basename 04/21/12 0730 04/21/12 0712 04/21/12 0700 04/21/12 0130 04/21/12 0106 04/20/12 1648  HGB 7.4* -- -- -- -- 9.0*  HCT 21.8* -- -- -- -- 26.0*  PLT 578* -- -- -- -- 592*  APTT -- -- -- -- -- --  LABPROT -- -- -- -- -- 16.3*  INR -- -- -- -- -- 1.29  HEPARINUNFRC -- <0.10* -- <0.10* -- --  CREATININE 0.71 -- -- -- -- 1.50*  CKTOTAL -- -- -- -- -- --  CKMB -- -- -- -- -- --  TROPONINI -- -- <0.30 -- <0.30 --    Assessment: 56yo female on heparin for new PE and DVT.  Heparin level remains undetectable (heparin at 1400 units/hr) despite dose increase overnight.  Per patient and RN, no trouble with infusion.  Hgb decreased 9-->7.4.  MD aware, plans to follow.  No overt bleeding/VTE complications noted.  Goal of Therapy:  Heparin level 0.3-0.7 units/ml   Plan:  Will rebolus with heparin 2000 units x1 and increase gtt by 4 units/kg/hr to 1700 units/hr (~24 units/kg/hr) and check level in 6hr.  Jennetta Flood L. Illene Bolus, PharmD, BCPS Clinical Pharmacist Pager: [phone] Pharmacy: [phone] 04/21/2012 8:53 AM

## 2012-04-21 NOTE — Progress Notes (Signed)
ANTICOAGULATION CONSULT NOTE - Follow Up Consult  Pharmacy Consult for heparin Indication: new PE/DVT  Labs:  Basename 04/21/12 0130 04/20/12 1648  HGB -- 9.0*  HCT -- 26.0*  PLT -- 592*  APTT -- --  LABPROT -- 16.3*  INR -- 1.29  HEPARINUNFRC <0.10* --  CREATININE -- 1.50*  CKTOTAL -- --  CKMB -- --  TROPONINI -- --    Assessment: 55yo female undetectable on heparin with initial dosing for new PE and DVT.  Goal of Therapy:  Heparin level 0.3-0.7 units/ml   Plan:  Will rebolus with heparin 2000 units x1 and increase gtt by 4 units/kg/hr to 1400 units/hr and check level in 6hr.  Colleen Can PharmD BCPS 04/21/2012,2:05 AM

## 2012-04-22 LAB — IRON AND TIBC
Iron: <10 ug/dL — ABNORMAL LOW (ref 42–135)
UIBC: 123 ug/dL — ABNORMAL LOW (ref 125–400)

## 2012-04-22 LAB — BASIC METABOLIC PANEL
BUN: 6 mg/dL (ref 6–23)
CO2: 22 mEq/L (ref 19–32)
Calcium: 8.3 mg/dL — ABNORMAL LOW (ref 8.4–10.5)
Chloride: 100 mEq/L (ref 96–112)
Creatinine, Ser: 0.57 mg/dL (ref 0.50–1.10)
GFR calc Af Amer: >90 mL/min (ref >90)
GFR calc non Af Amer: >90 mL/min (ref >90)
Glucose, Bld: 95 mg/dL (ref 70–99)
Potassium: 2.8 mEq/L — ABNORMAL LOW (ref 3.5–5.1)
Sodium: 134 mEq/L — ABNORMAL LOW (ref 135–145)

## 2012-04-22 LAB — FERRITIN: Ferritin: 301 ng/mL — ABNORMAL HIGH (ref 10–291)

## 2012-04-22 LAB — ANTITHROMBIN III: AntiThromb III Func: 81 % (ref 75–120)

## 2012-04-22 LAB — HEPARIN LEVEL (UNFRACTIONATED)
Heparin Unfractionated: <0.1 IU/mL — ABNORMAL LOW (ref 0.30–0.70)
Heparin Unfractionated: <0.1 IU/mL — ABNORMAL LOW (ref 0.30–0.70)
Heparin Unfractionated: 0.34 IU/mL (ref 0.30–0.70)

## 2012-04-22 LAB — CBC
HCT: 21.6 % — ABNORMAL LOW (ref 36.0–46.0)
Hemoglobin: 7.2 g/dL — ABNORMAL LOW (ref 12.0–15.0)
MCH: 28.7 pg (ref 26.0–34.0)
MCHC: 33.3 g/dL (ref 30.0–36.0)
MCV: 86.1 fL (ref 78.0–100.0)
Platelets: 607 10*3/uL — ABNORMAL HIGH (ref 150–400)
RBC: 2.51 MIL/uL — ABNORMAL LOW (ref 3.87–5.11)
RDW: 13.2 % (ref 11.5–15.5)
WBC: 14 10*3/uL — ABNORMAL HIGH (ref 4.0–10.5)

## 2012-04-22 LAB — PROTIME-INR
INR: 1.55 — ABNORMAL HIGH (ref 0.00–1.49)
Prothrombin Time: 18.9 seconds — ABNORMAL HIGH (ref 11.6–15.2)

## 2012-04-22 LAB — RETICULOCYTES
RBC.: 2.51 MIL/uL — ABNORMAL LOW (ref 3.87–5.11)
Retic Count, Absolute: 22.6 10*3/uL (ref 19.0–186.0)
Retic Ct Pct: 0.9 % (ref 0.4–3.1)

## 2012-04-22 LAB — FOLATE: Folate: 10.9 ng/mL

## 2012-04-22 LAB — VITAMIN B12: Vitamin B-12: 229 pg/mL (ref 211–911)

## 2012-04-22 MED ORDER — OXYCODONE-ACETAMINOPHEN 5-325 MG PO TABS
1.0000 | ORAL_TABLET | ORAL | Status: DC | PRN
  Administered 2012-04-22: 1 via ORAL
  Administered 2012-04-22 – 2012-04-23 (×2): 2 via ORAL
  Administered 2012-04-23: 1 via ORAL
  Administered 2012-04-23: 2 via ORAL
  Filled 2012-04-22: qty 1
  Filled 2012-04-22 (×4): qty 2

## 2012-04-22 MED ORDER — HEPARIN BOLUS VIA INFUSION
2500.0000 [IU] | Freq: Once | INTRAVENOUS | Status: AC
  Administered 2012-04-22: 2500 [IU] via INTRAVENOUS
  Filled 2012-04-22: qty 2500

## 2012-04-22 MED ORDER — HEPARIN BOLUS VIA INFUSION
2000.0000 [IU] | Freq: Once | INTRAVENOUS | Status: AC
  Administered 2012-04-22: 2000 [IU] via INTRAVENOUS
  Filled 2012-04-22: qty 2000

## 2012-04-22 MED ORDER — WARFARIN SODIUM 5 MG PO TABS
5.0000 mg | ORAL_TABLET | Freq: Once | ORAL | Status: AC
  Administered 2012-04-22: 5 mg via ORAL
  Filled 2012-04-22: qty 1

## 2012-04-22 MED ORDER — MORPHINE SULFATE 2 MG/ML IJ SOLN
1.0000 mg | INTRAMUSCULAR | Status: DC | PRN
  Administered 2012-04-22 – 2012-04-23 (×3): 1 mg via INTRAVENOUS
  Filled 2012-04-22 (×3): qty 1

## 2012-04-22 MED ORDER — HEPARIN (PORCINE) IN NACL 100-0.45 UNIT/ML-% IJ SOLN
2700.0000 [IU]/h | INTRAMUSCULAR | Status: AC
  Administered 2012-04-22 – 2012-04-23 (×3): 2700 [IU]/h via INTRAVENOUS
  Filled 2012-04-22 (×4): qty 250

## 2012-04-22 MED ORDER — POTASSIUM CHLORIDE CRYS ER 20 MEQ PO TBCR
40.0000 meq | EXTENDED_RELEASE_TABLET | Freq: Three times a day (TID) | ORAL | Status: AC
  Administered 2012-04-22 – 2012-04-23 (×2): 40 meq via ORAL
  Filled 2012-04-22: qty 2

## 2012-04-22 NOTE — Progress Notes (Signed)
ANTICOAGULATION CONSULT NOTE - Follow Up Consult  Pharmacy Consult for heparin Indication: pulmonary embolus and DVT  Labs:  Basename 04/21/12 2340 04/21/12 1401 04/21/12 0730 04/21/12 0712 04/21/12 0700 04/21/12 0106 04/20/12 1648  HGB -- -- 7.4* -- -- -- 9.0*  HCT -- -- 21.8* -- -- -- 26.0*  PLT -- -- 578* -- -- -- 592*  APTT -- -- -- -- -- -- --  LABPROT -- -- -- -- -- -- 16.3*  INR -- -- -- -- -- -- 1.29  HEPARINUNFRC <0.10* <0.10* -- <0.10* -- -- --  CREATININE -- -- 0.71 -- -- -- 1.50*  CKTOTAL -- -- -- -- -- -- --  CKMB -- -- -- -- -- -- --  TROPONINI -- <0.30 -- -- <0.30 <0.30 --    Assessment: 56yo female remains undetectable on heparin after multiple boluses and rate increases; per RN no IV issues or interruptions and no signs of bleeding.  Goal of Therapy:  Heparin level 0.3-0.7 units/ml   Plan:  Will rebolus with 2500 units of heparin x1 and increase gtt by 4-5 units/kg/hr to 2400 units/hr and check level in 6hr.  Colleen Can PharmD BCPS 04/22/2012,1:35 AM

## 2012-04-22 NOTE — Progress Notes (Signed)
Patient resting in bed, no acute distress noted.  Complain of dull aching pain in left lower leg. Warm to touch with good pulses.  Remain on Heparin Drip at 27ML/hr per MD order.  Call light in reach, bed in low position.

## 2012-04-22 NOTE — Progress Notes (Signed)
ANTICOAGULATION CONSULT NOTE - Initial Consult  Pharmacy Consult:  Coumadin / Heparin (Overlap D#2/5) Indication:  New DVT and bilateral PEs  Allergies  Allergen Reactions  . Prednisone Other (See Comments)    No energy "stalls out"  . Tramadol Other (See Comments)    constipation    Patient Measurements: Height: 5\' 4"  (162.6 cm) Weight: 176 lb 3.2 oz (79.924 kg) IBW/kg (Calculated) : 54.7  Heparin dosing weight:  71 kg     Vital Signs: Temp: 98.1 F (36.7 C) (09/05 0620) Temp src: Oral (09/05 0620) BP: 114/60 mmHg (09/05 0620) Pulse Rate: 94  (09/05 0620)  Labs:  Basename 04/22/12 0854 04/22/12 0600 04/21/12 2340 04/21/12 1401 04/21/12 0730 04/21/12 0700 04/21/12 0106 04/20/12 1648  HGB -- 7.2* -- -- 7.4* -- -- --  HCT -- 21.6* -- -- 21.8* -- -- 26.0*  PLT -- 607* -- -- 578* -- -- 592*  APTT -- -- -- -- -- -- -- --  LABPROT 18.9* -- -- -- -- -- -- 16.3*  INR 1.55* -- -- -- -- -- -- 1.29  HEPARINUNFRC <0.10* -- <0.10* <0.10* -- -- -- --  CREATININE -- 0.57 -- -- 0.71 -- -- 1.50*  CKTOTAL -- -- -- -- -- -- -- --  CKMB -- -- -- -- -- -- -- --  TROPONINI -- -- -- <0.30 -- <0.30 <0.30 --    Estimated Creatinine Clearance: 80.3 ml/min (by C-G formula based on Cr of 0.57).   Medical History: Past Medical History  Diagnosis Date  . No pertinent past medical history        Assessment: 56 YOF with new DVT and bilateral PEs to continue on IV heparin and Coumadin.  Patient's heparin level remains undetectable despite multiple large rate increases.  Potentially due to clot burden but I'm also concerned patient may have AT III deficiency.  Patient's INR increased post Coumadin 7.5mg  PO x 1 dose last night.  No bleeding documented.  RN reported no complications with heparin infusion/IV site.   Goal of Therapy:  INR 2-3 HL 0.3 - 0.7 units/mL Monitor platelets by anticoagulation protocol: Yes    Plan:  - Repeat 2000 units bolus x 1, then - Increase heparin gtt to  2700 units/hr - Check 6 hr HL and AT III level - Coumadin 5mg  PO today - Continue daily HL / CBC / PT / INR     Antwon Rochin D. Laney Potash, PharmD, BCPS Pager:  [phone] 04/22/2012, 10:50 AM

## 2012-04-22 NOTE — Progress Notes (Signed)
[name] [name], OTR/L Pager: [phone] 04/22/2012

## 2012-04-22 NOTE — Progress Notes (Signed)
Patient ID: [name] [name], female   DOB: 1955-12-17, 56 y.o.   MRN: [id number]  [facility] PROGRESS NOTE  [name] [name] WUJ:[id number] DOB: 09-Sep-1955 DOA: 04/20/2012 PCP: No primary provider on file.  Brief narrative:  15 her old female with recent history of back surgery at [facility] on 04/05/2012. Was at the orthopedic physician's office having her staples removed when was noted to have left lower extremity edema and leg pain with ambulation. She was instructed to come to a [facility] emergency department. Workup in the ER did reveal a DVT in the left lower extremity. Because she was experiencing tachycardia CT angiogram was done which demonstrated bilateral PE with moderate clot burden. Patient was not hypoxemic but laboratory data did reveal hyponatremia and hypokalemia with increased creatinine consistent with acute renal failure and dehydration. Patient endorses to not eating well for a few days after the surgery but denied any other GI symptomatology. In addition because of left lower extremity pain she has been ambulating less. At presentation she was somewhat hypotensive with a systolic blood pressure in the 60s so was given IV fluid challenges in the emergency department and subsequently now is hemodynamically stable. She was subsequently admitted to the step down unit for further monitoring and treatment.   Assessment/Plan:  Pulmonary embolism, bilateral- moderate clot burden  *Currently without hypoxemia  *Hemodynamically stable and no evidence of RV strain on CT of the chest  *Troponin I has been negative x2  *Continue anticoagulation  DVT of left lower limb, acute  *Extensive DVT extending from lower leg to the saphenous vein and common femoral vein  *Continue IV heparin  *Transition to coumadin  ARF (acute renal failure)- baseline creatinine 0.55  *Continue IV fluids  *Creatinine baseline 0.55   Hypokalemia  *Replace and follow - likely due to diuretic use at home     Sinus tachycardia  *Likely related to acute pulmonary emboli  *Now resolved  Dehydration with hyponatremia/Hypokalemia  *Improving with IV fluids  *Continue to monitor electrolyte panel   Leukocytosis  *An acute reactant due to stress from PE and DVT  *Trend is downward and no evidence of fever or other infectious process including on CT of the chest   Anemia  *Hemoglobin has dropped from 9.0-7.4 since admission the patient has also been given significant volume of fluid so this may be dilutional  *Repeat CBC in the morning  *Check an anemia panel  *Baseline hemoglobin 12.9 in August of 2011   Tobacco abuse  *Counsel on need to d/c use completely   HTN (hypertension)  *Blood pressure currently well controlled  *Was on combination ACE inhibitor and thiazide diuretic prior to admission  *Given presentation with dehydration and acute renal failure will continue to hold diuretic and reevaluate in the morning as to whether her ACE inhibitor can be resumed   History of back surgery  *Currently no back pain and was recently evaluated by her doctor just prior to admission with removal of staples and no apparent evidence of infection  *Patient endorses was having difficulty with weightbearing on the left leg prior to admission with the leg giving way and was utilizing a walker at home therefore we'll ask PT and OT to evaluate   DVT prophylaxis: Currently on full dose heparin  Code Status: Full   Consultants:  None   Procedures:  None   Antibiotics:  None  HPI/Subjective: No events overnight.   Objective: Filed Vitals:   04/22/12 9562 04/22/12 0845 04/22/12 1353 04/22/12 1714  BP: 114/60 123/64 107/58 108/56  Pulse: 94 87 95 92  Temp: 98.1 F (36.7 C) 97.2 F (36.2 C) 97.6 F (36.4 C) 98.1 F (36.7 C)  TempSrc: Oral Oral    Resp: 18 18 18 17   Height:      Weight:      SpO2: 98% 97% 99% 99%    Intake/Output Summary (Last 24 hours) at 04/22/12 1756 Last data  filed at 04/22/12 1700  Gross per 24 hour  Intake 2628.09 ml  Output    600 ml  Net 2028.09 ml    Exam:   General:  Pt is alert, follows commands appropriately, not in acute distress  Cardiovascular: Regular rate and rhythm, S1/S2, no murmurs, no rubs, no gallops  Respiratory: Clear to auscultation bilaterally, no wheezing, no crackles, no rhonchi  Abdomen: Soft, non tender, non distended, bowel sounds present, no guarding  Extremities: No edema, pulses DP and PT palpable bilaterally  Neuro: Grossly nonfocal  Data Reviewed: Basic Metabolic Panel:  Lab 04/22/12 1610 04/21/12 0730 04/21/12 0700 04/20/12 1648  NA 134* 133* -- 126*  K 2.8* 2.8* -- 2.8*  CL 100 99 -- 84*  CO2 22 20 -- 28  GLUCOSE 95 85 -- 128*  BUN 6 9 -- 14  CREATININE 0.57 0.71 -- 1.50*  CALCIUM 8.3* 8.1* -- 9.2  MG -- -- 1.9 --  PHOS -- -- -- --   Liver Function Tests:  Lab 04/21/12 0730  AST 15  ALT 9  ALKPHOS 170*  BILITOT 0.3  PROT 5.6*  ALBUMIN 1.7*   CBC:  Lab 04/22/12 0600 04/21/12 0730 04/20/12 1648  WBC 14.0* 17.8* 21.6*  NEUTROABS -- 14.3* 18.6*  HGB 7.2* 7.4* 9.0*  HCT 21.6* 21.8* 26.0*  MCV 86.1 85.2 85.2  PLT 607* 578* 592*   Cardiac Enzymes:  Lab 04/21/12 1401 04/21/12 0700 04/21/12 0106  CKTOTAL -- -- --  CKMB -- -- --  CKMBINDEX -- -- --  TROPONINI <0.30 <0.30 <0.30   CBG:  Lab 04/20/12 2209  GLUCAP 103*    Recent Results (from the past 240 hour(s))  CULTURE, BLOOD (ROUTINE X 2)     Status: Normal (Preliminary result)   Collection Time   04/20/12  8:55 PM      Component Value Range Status Comment   Specimen Description BLOOD RIGHT HAND   Final    Special Requests BOTTLES DRAWN AEROBIC ONLY 5CC   Final    Culture  Setup Time 04/21/2012 01:25   Final    Culture     Final    Value:        BLOOD CULTURE RECEIVED NO GROWTH TO DATE CULTURE WILL BE HELD FOR 5 DAYS BEFORE ISSUING A FINAL NEGATIVE REPORT   Report Status PENDING   Incomplete   CULTURE, BLOOD (ROUTINE  X 2)     Status: Normal (Preliminary result)   Collection Time   04/20/12  9:10 PM      Component Value Range Status Comment   Specimen Description BLOOD LEFT HAND   Final    Special Requests BOTTLES DRAWN AEROBIC ONLY 5CC   Final    Culture  Setup Time 04/21/2012 01:26   Final    Culture     Final    Value:        BLOOD CULTURE RECEIVED NO GROWTH TO DATE CULTURE WILL BE HELD FOR 5 DAYS BEFORE ISSUING A FINAL NEGATIVE REPORT   Report Status PENDING   Incomplete  MRSA PCR SCREENING     Status: Normal   Collection Time   04/20/12 10:11 PM      Component Value Range Status Comment   MRSA by PCR NEGATIVE  NEGATIVE Final      Scheduled Meds:   . cyclobenzaprine  10 mg Oral TID  . heparin  2,000 Units Intravenous Once  . heparin  2,500 Units Intravenous Once  . potassium chloride  40 mEq Oral BID  . senna-docusate  2 tablet Oral QHS  . warfarin  5 mg Oral ONCE-1800  . warfarin  7.5 mg Oral ONCE-1800     [name] Presto, MD  [facility] Pager [phone]  If 7PM-7AM, please contact night-coverage [email or web address] Password [email or web address] 04/22/2012, 5:56 PM   LOS: 2 days

## 2012-04-22 NOTE — Progress Notes (Signed)
ANTICOAGULATION CONSULT NOTE - Initial Consult  Pharmacy Consult:  Coumadin / Heparin (Overlap D#2/5) Indication:  New DVT and bilateral PEs  Allergies  Allergen Reactions  . Prednisone Other (See Comments)    No energy "stalls out"  . Tramadol Other (See Comments)    constipation    Patient Measurements: Height: 5\' 4"  (162.6 cm) Weight: 176 lb 3.2 oz (79.924 kg) IBW/kg (Calculated) : 54.7  Heparin dosing weight:  71 kg     Vital Signs: Temp: 98.1 F (36.7 C) (09/05 1714) Temp src: Oral (09/05 0845) BP: 108/56 mmHg (09/05 1714) Pulse Rate: 92  (09/05 1714)  Labs:  Basename 04/22/12 1725 04/22/12 0854 04/22/12 0600 04/21/12 2340 04/21/12 1401 04/21/12 0730 04/21/12 0700 04/21/12 0106 04/20/12 1648  HGB -- -- 7.2* -- -- 7.4* -- -- --  HCT -- -- 21.6* -- -- 21.8* -- -- 26.0*  PLT -- -- 607* -- -- 578* -- -- 592*  APTT -- -- -- -- -- -- -- -- --  LABPROT -- 18.9* -- -- -- -- -- -- 16.3*  INR -- 1.55* -- -- -- -- -- -- 1.29  HEPARINUNFRC 0.34 <0.10* -- <0.10* -- -- -- -- --  CREATININE -- -- 0.57 -- -- 0.71 -- -- 1.50*  CKTOTAL -- -- -- -- -- -- -- -- --  CKMB -- -- -- -- -- -- -- -- --  TROPONINI -- -- -- -- <0.30 -- <0.30 <0.30 --    Estimated Creatinine Clearance: 80.3 ml/min (by C-G formula based on Cr of 0.57).   Medical History: Past Medical History  Diagnosis Date  . No pertinent past medical history        Assessment: 21 YOF with new DVT and bilateral PEs to continue on IV heparin and Coumadin.  Patient's heparin level is finally therapeutic (=0.34) after multiple large rate increases.  ATIII level = 81 which is within normal range. No bleeding documented per chart.  Goal of Therapy:  INR 2-3 HL 0.3 - 0.7 units/mL Monitor platelets by anticoagulation protocol: Yes    Plan:  - Continue heparin gtt to 2700 units/hr - Check 6 hr HL to confirm - Continue daily HL / CBC / PT / INR  Bayard Hugger, PharmD, BCPS  Clinical Pharmacist  Pager:  [phone]   04/22/2012, 7:47 PM

## 2012-04-22 NOTE — Progress Notes (Signed)
PT/OT Cancellation Note  Treatment cancelled today due to low Hgb, low heparin level and subtherapeutic INR.  Will await further stabilization of these values before proceeding with PT/OT evals.    Ferman Hamming 04/22/2012, 9:52 AM Weldon Picking PT Acute Re[facility] Services [phone] [place] [phone]

## 2012-04-23 LAB — CBC
HCT: 22.5 % — ABNORMAL LOW (ref 36.0–46.0)
Hemoglobin: 7.4 g/dL — ABNORMAL LOW (ref 12.0–15.0)
MCH: 28.9 pg (ref 26.0–34.0)
MCHC: 32.9 g/dL (ref 30.0–36.0)
MCV: 87.9 fL (ref 78.0–100.0)
Platelets: 670 10*3/uL — ABNORMAL HIGH (ref 150–400)
RBC: 2.56 MIL/uL — ABNORMAL LOW (ref 3.87–5.11)
RDW: 13.5 % (ref 11.5–15.5)
WBC: 11.5 10*3/uL — ABNORMAL HIGH (ref 4.0–10.5)

## 2012-04-23 LAB — BASIC METABOLIC PANEL
BUN: 6 mg/dL (ref 6–23)
CO2: 23 mEq/L (ref 19–32)
Calcium: 8.6 mg/dL (ref 8.4–10.5)
Chloride: 103 mEq/L (ref 96–112)
Creatinine, Ser: 0.51 mg/dL (ref 0.50–1.10)
GFR calc Af Amer: >90 mL/min (ref >90)
GFR calc non Af Amer: >90 mL/min (ref >90)
Glucose, Bld: 91 mg/dL (ref 70–99)
Potassium: 3.3 mEq/L — ABNORMAL LOW (ref 3.5–5.1)
Sodium: 136 mEq/L (ref 135–145)

## 2012-04-23 LAB — HEPARIN LEVEL (UNFRACTIONATED)
Heparin Unfractionated: 0.48 IU/mL (ref 0.30–0.70)
Heparin Unfractionated: 0.52 IU/mL (ref 0.30–0.70)

## 2012-04-23 LAB — PROTIME-INR
INR: 2.1 — ABNORMAL HIGH (ref 0.00–1.49)
Prothrombin Time: 23.9 seconds — ABNORMAL HIGH (ref 11.6–15.2)

## 2012-04-23 MED ORDER — OXYCODONE-ACETAMINOPHEN 5-325 MG PO TABS: 1.0000 | ORAL_TABLET | ORAL | Status: AC | PRN

## 2012-04-23 MED ORDER — WARFARIN SODIUM 5 MG PO TABS: 2.5000 mg | ORAL_TABLET | Freq: Every day | ORAL | Status: DC

## 2012-04-23 MED ORDER — ENOXAPARIN SODIUM 120 MG/0.8ML ~~LOC~~ SOLN: 120.0000 mg | SUBCUTANEOUS | Status: DC

## 2012-04-23 MED ORDER — ENOXAPARIN SODIUM 120 MG/0.8ML ~~LOC~~ SOLN
120.0000 mg | SUBCUTANEOUS | Status: DC
  Administered 2012-04-23: 120 mg via SUBCUTANEOUS
  Filled 2012-04-23: qty 0.8

## 2012-04-23 MED ORDER — ENOXAPARIN (LOVENOX) PATIENT EDUCATION KIT
PACK | Freq: Once | Status: DC
  Filled 2012-04-23: qty 1

## 2012-04-23 MED ORDER — WARFARIN SODIUM 2.5 MG PO TABS
2.5000 mg | ORAL_TABLET | Freq: Once | ORAL | Status: DC
  Filled 2012-04-23: qty 1

## 2012-04-23 NOTE — Progress Notes (Signed)
Attempted to see pt this am.  Pt cont with DVT in L leg and now questionable DVT in L arm.  Hgb remains low and blood levels not yet okay for therapy.  Nursing asked to hold therapy at this time.  Will check back as schedule allows. Tory Emerald, [place] [phone]

## 2012-04-23 NOTE — Progress Notes (Addendum)
ANTICOAGULATION CONSULT NOTE - Follow Up Consult  Pharmacy Consult:  Heparin / Coumadin (Overlap D#3/5) Indication:  New DVT and bilateral PEs   Allergies  Allergen Reactions  . Prednisone Other (See Comments)    No energy "stalls out"  . Tramadol Other (See Comments)    constipation    Patient Measurements: Height: 5\' 4"  (162.6 cm) Weight: 179 lb 7.3 oz (81.4 kg) IBW/kg (Calculated) : 54.7  Heparin Dosing Weight: 71 kg  Vital Signs: Temp: 98 F (36.7 C) (09/06 0750) Temp src: Oral (09/06 0750) BP: 103/68 mmHg (09/06 0750) Pulse Rate: 87  (09/06 0750)  Labs:  Basename 04/23/12 0600 04/22/12 2353 04/22/12 1725 04/22/12 0854 04/22/12 0600 04/21/12 1401 04/21/12 0730 04/21/12 0700 04/21/12 0106 04/20/12 1648  HGB 7.4* -- -- -- 7.2* -- -- -- -- --  HCT 22.5* -- -- -- 21.6* -- 21.8* -- -- --  PLT 670* -- -- -- 607* -- 578* -- -- --  APTT -- -- -- -- -- -- -- -- -- --  LABPROT 23.9* -- -- 18.9* -- -- -- -- -- 16.3*  INR 2.10* -- -- 1.55* -- -- -- -- -- 1.29  HEPARINUNFRC 0.52 0.48 0.34 -- -- -- -- -- -- --  CREATININE 0.51 -- -- -- 0.57 -- 0.71 -- -- --  CKTOTAL -- -- -- -- -- -- -- -- -- --  CKMB -- -- -- -- -- -- -- -- -- --  TROPONINI -- -- -- -- -- <0.30 -- <0.30 <0.30 --    Estimated Creatinine Clearance: 81.1 ml/min (by C-G formula based on Cr of 0.51).       Assessment: [name] [name] with new DVT and bilateral PEs to continue on IV heparin and Coumadin.  Heparin level therapeutic and INR is at goal post two doses of Coumadin.  No bleeding reported.   Goal of Therapy:  Heparin level 0.3-0.7 units/ml INR 2 - 3 Monitor platelets by anticoagulation protocol: Yes     Plan:  - Heparin gtt at 2700 units/hr - Coumadin 2.5mg  PO today - Continue daily HL / CBC / PT / INR - F/U iron supplementation     Cereniti Curb D. Laney Potash, PharmD, BCPS Pager:  [phone] 04/23/2012, 9:33 AM     Addendum: Patient is being transition from IV heparin to SQ Lovenox for discharge.  Per  discussion with MD, will discharge patient on Lovenox 120mg  SQ Q24H (1.5mg /kg/day) for at least 2 more days to complete the 5 day overlap.  D/C patient home on Coumadin 5mg  tablet, take 2.5mg  PO daily at 1800 until INR check on Monday 04/26/12 for further dosing.    Apostolos Blagg D. Laney Potash, PharmD, BCPS Pager:  [phone] 04/23/2012, 3:14 PM

## 2012-04-23 NOTE — Progress Notes (Signed)
PT Cancellation Note  Treatment cancelled today due to medical issues with patient which prohibited therapy.  Pt. With swelling in arm, questionable additional clot?  RN Allyson asked PT to hold on eval today.  Will f/u over weekend.  Ferman Hamming 04/23/2012, 11:22 AM Weldon Picking PT Acute Rehab [facility] [phone] [place] [phone]

## 2012-04-23 NOTE — Progress Notes (Signed)
ANTICOAGULATION CONSULT NOTE - Follow Up Consult  Pharmacy Consult:  Coumadin / Heparin (Overlap D#2/5) Indication:  New DVT and bilateral PEs  Allergies  Allergen Reactions  . Prednisone Other (See Comments)    No energy "stalls out"  . Tramadol Other (See Comments)    constipation    Patient Measurements: Height: 5\' 4"  (162.6 cm) Weight: 179 lb 7.3 oz (81.4 kg) IBW/kg (Calculated) : 54.7  Heparin dosing weight:  71 kg     Vital Signs: Temp: 99.3 F (37.4 C) (09/05 2012) Temp src: Oral (09/05 2012) BP: 116/67 mmHg (09/05 2012) Pulse Rate: 95  (09/05 2012)  Labs:  Basename 04/22/12 2353 04/22/12 1725 04/22/12 0854 04/22/12 0600 04/21/12 1401 04/21/12 0730 04/21/12 0700 04/21/12 0106 04/20/12 1648  HGB -- -- -- 7.2* -- 7.4* -- -- --  HCT -- -- -- 21.6* -- 21.8* -- -- 26.0*  PLT -- -- -- 607* -- 578* -- -- 592*  APTT -- -- -- -- -- -- -- -- --  LABPROT -- -- 18.9* -- -- -- -- -- 16.3*  INR -- -- 1.55* -- -- -- -- -- 1.29  HEPARINUNFRC 0.48 0.34 <0.10* -- -- -- -- -- --  CREATININE -- -- -- 0.57 -- 0.71 -- -- 1.50*  CKTOTAL -- -- -- -- -- -- -- -- --  CKMB -- -- -- -- -- -- -- -- --  TROPONINI -- -- -- -- <0.30 -- <0.30 <0.30 --    Estimated Creatinine Clearance: 81.1 ml/min (by C-G formula based on Cr of 0.57).   Medical History: Past Medical History  Diagnosis Date  . No pertinent past medical history     Assessment: 78 YOF with new DVT and bilateral PEs to continue on IV heparin and Coumadin. Heparin level (0.48) is at-goal on 2700 units/hr.   Goal of Therapy:  INR 2-3 HL 0.3 - 0.7 units/mL Monitor platelets by anticoagulation protocol: Yes    Plan:  1. Continue IV heparin at 2700 units/hr.  2. Daily CBC, heparin level.  Lorre Munroe, PharmD, BCPS 04/23/2012, 12:35 AM

## 2012-04-23 NOTE — Progress Notes (Signed)
Pt was given all D/Ced orders , FU care ,RX

## 2012-04-23 NOTE — Progress Notes (Signed)
Lovenox education completed.  Patient and family member (husband) viewed video and received Lovenox kit.  Husband administered injection correctly.  All questions answered. Copper Kirtley 3:53 PM

## 2012-04-23 NOTE — Discharge Summary (Signed)
Physician Discharge Summary  [name] [name] ZOX:[id number] DOB: 04/29/1956 DOA: 04/20/2012  PCP: No primary provider on file.  Admit date: 04/20/2012 Discharge date: 04/23/2012  Recommendations for Outpatient Follow-up:  1. Pt will need to follow up with PCP in 2-3 weeks post discharge 2. Please obtain BMP to evaluate electrolytes and kidney function 3. Please also check CBC to evaluate Hg and Hct levels 4. Please note pt was given one dose of K-dur 40 meq prior to discharge 5. Lease also check PT/INR and readjust the medication regimen as indicated  Discharge Diagnoses:  Principal Problem:  *Pulmonary embolism, bilateral- moderate clot burden Active Problems:  DVT of left lower limb, acute  Dehydration with hyponatremia  Anemia  Hypokalemia  ARF (acute renal failure)- baseline creatinine 0.55  Tobacco abuse  HTN (hypertension)  History of back surgery  Leukocytosis  Sinus tachycardia   Discharge Condition: Stable  Diet recommendation: Heart healthy diet discussed in details   Brief narrative:  32 her old female with recent history of back surgery at [facility] on 04/05/2012. Was at the orthopedic physician's office having her staples removed when was noted to have left lower extremity edema and leg pain with ambulation. She was instructed to come to a [facility] emergency department. Workup in the ER did reveal a DVT in the left lower extremity. Because she was experiencing tachycardia CT angiogram was done which demonstrated bilateral PE with moderate clot burden. Patient was not hypoxemic but laboratory data did reveal hyponatremia and hypokalemia with increased creatinine consistent with acute renal failure and dehydration. Patient endorses to not eating well for a few days after the surgery but denied any other GI symptomatology. In addition because of left lower extremity pain she has been ambulating less. At presentation she was somewhat hypotensive with a systolic blood  pressure in the 60s so was given IV fluid challenges in the emergency department and subsequently now is hemodynamically stable. She was subsequently admitted to the step down unit for further monitoring and treatment.   Assessment/Plan:  Pulmonary embolism, bilateral- moderate clot burden  *Currently without hypoxemia  *Hemodynamically stable and no evidence of RV strain on CT of the chest  *Troponin I has been negative x2  *Continue anticoagulation   DVT of left lower limb, acute  *Extensive DVT extending from lower leg to the saphenous vein and common femoral vein  *Continued IV heparin  *Transitioned to coumadin but discharged on Lovenox for 2 days as pt desire was to go home today  ARF (acute renal failure)- baseline creatinine 0.55  *Continued IV fluids, pt responded well  *Creatinine baseline 0.55   Hypokalemia  *Replace and follow - likely due to diuretic use at home --> resolved  Sinus tachycardia  *Likely related to acute pulmonary emboli  *Now resolved   Dehydration with hyponatremia/Hypokalemia  *Improving with IV fluids  *Continue to monitor electrolyte panel in an outpt setting  Leukocytosis  *An acute reactant due to stress from PE and DVT  *Trend is downward and no evidence of fever or other infectious process including on CT of the chest   Anemia  *Hemoglobin has dropped from 9.0-7.4 since admission the patient has also been given significant volume of fluid so this may be dilutional  *Repeat CBC in an outpt setting *Checked an anemia panel and the results pending on discharge date *Baseline hemoglobin 12.9 in August of 2011   Tobacco abuse  *Counselled on need to d/c use completely   HTN (hypertension)  *Blood pressure currently  well controlled  *Was on combination ACE inhibitor and thiazide diuretic prior to admission  *Given presentation with dehydration and acute renal failure will continue to hold diuretic and reevaluate in the morning as to whether  her ACE inhibitor can be resumed   History of back surgery  *Currently no back pain and was recently evaluated by her doctor just prior to admission with removal of staples and no apparent evidence of infection  *Patient endorses was having difficulty with weightbearing on the left leg prior to admission with the leg giving way and was utilizing a walker at home therefore we'll ask PT and OT to evaluate   Code Status: Full   Consultants:  None  Procedures:  None  Antibiotics:  None  Discharge Exam: Filed Vitals:   04/23/12 1415  BP: 109/65  Pulse: 92  Temp: 98.1 F (36.7 C)  Resp: 16   Filed Vitals:   04/23/12 0522 04/23/12 0750 04/23/12 1212 04/23/12 1415  BP: 112/67 103/68 108/67 109/65  Pulse: 91 87 91 92  Temp: 98.3 F (36.8 C) 98 F (36.7 C) 98.4 F (36.9 C) 98.1 F (36.7 C)  TempSrc: Oral Oral Oral Oral  Resp: 18 18 16 16   Height:      Weight:      SpO2: 96% 95% 98% 100%    General: Pt is alert, follows commands appropriately, not in acute distress Cardiovascular: Regular rate and rhythm, S1/S2 +, no murmurs, no rubs, no gallops Respiratory: Clear to auscultation bilaterally, no wheezing, no crackles, no rhonchi Abdominal: Soft, non tender, non distended, bowel sounds +, no guarding Extremities: no edema, no cyanosis, pulses palpable bilaterally DP and PT Neuro: Grossly nonfocal  Discharge Instructions  Discharge Orders    Future Orders Please Complete By Expires   Diet - low sodium heart healthy      Increase activity slowly        Medication List  As of 04/23/2012  2:49 PM   STOP taking these medications         oxycodone 5 MG capsule         TAKE these medications         cyclobenzaprine 10 MG tablet   Commonly known as: FLEXERIL   Take 10 mg by mouth 3 (three) times daily.      enoxaparin 120 MG/0.8ML injection   Commonly known as: LOVENOX   Inject 0.8 mLs (120 mg total) into the skin daily.      lisinopril-hydrochlorothiazide 20-12.5 MG  per tablet   Commonly known as: PRINZIDE,ZESTORETIC   Take 0.5 tablets by mouth daily.      oxyCODONE-acetaminophen 5-325 MG per tablet   Commonly known as: PERCOCET/ROXICET   Take 1-2 tablets by mouth every 4 (four) hours as needed.      sennosides-docusate sodium 8.6-50 MG tablet   Commonly known as: SENOKOT-S   Take 2 tablets by mouth every evening.      warfarin 5 MG tablet   Commonly known as: COUMADIN   Take 0.5 tablets (2.5 mg total) by mouth daily.           Follow-up Information    Follow up with Rudi Heap, MD. (primary care provider Monday for PT/INR check)    Contact information:   [address] [place] [address] [phone]           The results of significant diagnostics from this hospitalization (including imaging, microbiology, ancillary and laboratory) are listed below for reference.  Microbiology: Recent Results (from the past 240 hour(s))  CULTURE, BLOOD (ROUTINE X 2)     Status: Normal (Preliminary result)   Collection Time   04/20/12  8:55 PM      Component Value Range Status Comment   Specimen Description BLOOD RIGHT HAND   Final    Special Requests BOTTLES DRAWN AEROBIC ONLY 5CC   Final    Culture  Setup Time 04/21/2012 01:25   Final    Culture     Final    Value:        BLOOD CULTURE RECEIVED NO GROWTH TO DATE CULTURE WILL BE HELD FOR 5 DAYS BEFORE ISSUING A FINAL NEGATIVE REPORT   Report Status PENDING   Incomplete   CULTURE, BLOOD (ROUTINE X 2)     Status: Normal (Preliminary result)   Collection Time   04/20/12  9:10 PM      Component Value Range Status Comment   Specimen Description BLOOD LEFT HAND   Final    Special Requests BOTTLES DRAWN AEROBIC ONLY 5CC   Final    Culture  Setup Time 04/21/2012 01:26   Final    Culture     Final    Value:        BLOOD CULTURE RECEIVED NO GROWTH TO DATE CULTURE WILL BE HELD FOR 5 DAYS BEFORE ISSUING A FINAL NEGATIVE REPORT   Report Status PENDING   Incomplete   MRSA PCR  SCREENING     Status: Normal   Collection Time   04/20/12 10:11 PM      Component Value Range Status Comment   MRSA by PCR NEGATIVE  NEGATIVE Final      Labs: Basic Metabolic Panel:  Lab 04/23/12 4098 04/22/12 0600 04/21/12 0730 04/21/12 0700 04/20/12 1648  NA 136 134* 133* -- 126*  K 3.3* 2.8* 2.8* -- 2.8*  CL 103 100 99 -- 84*  CO2 23 22 20  -- 28  GLUCOSE 91 95 85 -- 128*  BUN 6 6 9  -- 14  CREATININE 0.51 0.57 0.71 -- 1.50*  CALCIUM 8.6 8.3* 8.1* -- 9.2  MG -- -- -- 1.9 --  PHOS -- -- -- -- --   Liver Function Tests:  Lab 04/21/12 0730  AST 15  ALT 9  ALKPHOS 170*  BILITOT 0.3  PROT 5.6*  ALBUMIN 1.7*   No results found for this basename: LIPASE:5,AMYLASE:5 in the last 168 hours No results found for this basename: AMMONIA:5 in the last 168 hours CBC:  Lab 04/23/12 0600 04/22/12 0600 04/21/12 0730 04/20/12 1648  WBC 11.5* 14.0* 17.8* 21.6*  NEUTROABS -- -- 14.3* 18.6*  HGB 7.4* 7.2* 7.4* 9.0*  HCT 22.5* 21.6* 21.8* 26.0*  MCV 87.9 86.1 85.2 85.2  PLT 670* 607* 578* 592*   Cardiac Enzymes:  Lab 04/21/12 1401 04/21/12 0700 04/21/12 0106  CKTOTAL -- -- --  CKMB -- -- --  CKMBINDEX -- -- --  TROPONINI <0.30 <0.30 <0.30   BNP: BNP (last 3 results)  Basename 04/21/12 0106  PROBNP 292.5*   CBG:  Lab 04/20/12 2209  GLUCAP 103*     SIGNED: Time coordinating discharge: Over 30 minutes  Debbora Presto, MD  [facility] 04/23/2012, 2:49 PM Pager [phone]  If 7PM-7AM, please contact night-coverage [email or web address] Password [email or web address]

## 2012-04-27 LAB — CULTURE, BLOOD (ROUTINE X 2)
Culture: NO GROWTH
Culture: NO GROWTH

## 2012-05-12 ENCOUNTER — Ambulatory Visit (HOSPITAL_COMMUNITY)
Admission: RE | Admit: 2012-05-12 | Discharge: 2012-05-12 | Disposition: A | Discharge status: Home / Self Care | Payer: BC Managed Care – PPO | Source: Ambulatory Visit | Attending: Family Medicine | Admitting: Family Medicine

## 2012-05-12 ENCOUNTER — Encounter (HOSPITAL_COMMUNITY): Payer: Self-pay | Admitting: *Deleted

## 2012-05-12 ENCOUNTER — Other Ambulatory Visit: Payer: Self-pay | Admitting: Family Medicine

## 2012-05-12 ENCOUNTER — Emergency Department (HOSPITAL_COMMUNITY)
Admission: EM | Admit: 2012-05-12 | Discharge: 2012-05-12 | Disposition: A | Discharge status: Home / Self Care | Payer: BC Managed Care – PPO | Attending: Emergency Medicine | Admitting: Emergency Medicine

## 2012-05-12 DIAGNOSIS — R52 Pain, unspecified: Secondary | ICD-10-CM

## 2012-05-12 DIAGNOSIS — M79609 Pain in unspecified limb: Secondary | ICD-10-CM | POA: Insufficient documentation

## 2012-05-12 DIAGNOSIS — Z7901 Long term (current) use of anticoagulants: Secondary | ICD-10-CM | POA: Insufficient documentation

## 2012-05-12 DIAGNOSIS — F17200 Nicotine dependence, unspecified, uncomplicated: Secondary | ICD-10-CM | POA: Insufficient documentation

## 2012-05-12 DIAGNOSIS — I824Y9 Acute embolism and thrombosis of unspecified deep veins of unspecified proximal lower extremity: Secondary | ICD-10-CM | POA: Insufficient documentation

## 2012-05-12 DIAGNOSIS — R609 Edema, unspecified: Secondary | ICD-10-CM

## 2012-05-12 DIAGNOSIS — Z79899 Other long term (current) drug therapy: Secondary | ICD-10-CM | POA: Insufficient documentation

## 2012-05-12 DIAGNOSIS — M7989 Other specified soft tissue disorders: Secondary | ICD-10-CM | POA: Insufficient documentation

## 2012-05-12 DIAGNOSIS — Z888 Allergy status to other drugs, medicaments and biological substances status: Secondary | ICD-10-CM | POA: Insufficient documentation

## 2012-05-12 DIAGNOSIS — I82402 Acute embolism and thrombosis of unspecified deep veins of left lower extremity: Secondary | ICD-10-CM

## 2012-05-12 HISTORY — DX: Other pulmonary embolism without acute cor pulmonale: I26.99

## 2012-05-12 HISTORY — DX: Acute embolism and thrombosis of unspecified deep veins of unspecified lower extremity: I82.409

## 2012-05-12 MED ORDER — OXYCODONE-ACETAMINOPHEN 5-325 MG PO TABS: ORAL_TABLET | ORAL | Status: DC

## 2012-05-12 NOTE — ED Provider Notes (Signed)
History     CSN: [id number]  Arrival date & time 05/12/12  1200   First MD Initiated Contact with Patient 05/12/12 1450      Chief Complaint  Patient presents with  . Leg Swelling    pt with known DVT     HPI Pt was seen at 1500.  Per pt, c/o gradual onset and persistence of constant LLE swelling for the past 3+ weeks.  Pt states the swelling began s/p back surgery, was Dx DVT and PE, and was admitted 04/20/12 for same.  States she was started on lovenox and coumadin. Endorses her INR was checked on 05/04/12 and "was 2.4" per pt and her husband.  States the swelling of her LLE "comes and goes," improves with elevation, worsens "when I'm up on my feet a lot." Denies CP/SOB, no DOE, no cough, no fever, no rash, no injury, no back pain, no abd pain, no focal motor weakness, no tingling/numbness in extremities.     Past Medical History  Diagnosis Date  . DVT (deep venous thrombosis)   . Pulmonary embolism     Past Surgical History  Procedure Date  . Back surgery     Family History  Problem Relation Age of Onset  . Leukemia Mother   . Prostate cancer Father     History  Substance Use Topics  . Smoking status: Current Every Day Smoker  . Smokeless tobacco: Not on file  . Alcohol Use: Yes     occ    Review of Systems ROS: Statement: All systems negative except as marked or noted in the HPI; Constitutional: Negative for fever and chills. ; ; Eyes: Negative for eye pain, redness and discharge. ; ; ENMT: Negative for ear pain, hoarseness, nasal congestion, sinus pressure and sore throat. ; ; Cardiovascular: Negative for chest pain, palpitations, diaphoresis, dyspnea. ; ; Respiratory: Negative for cough, wheezing and stridor. ; ; Gastrointestinal: Negative for nausea, vomiting, diarrhea, abdominal pain, blood in stool, hematemesis, jaundice and rectal bleeding. . ; ; Genitourinary: Negative for dysuria, flank pain and hematuria. ; ; Musculoskeletal: +LLE swelling. Negative for back  pain and neck pain. Negative for trauma.; ; Skin: Negative for pruritus, rash, abrasions, blisters, bruising and skin lesion.; ; Neuro: Negative for headache, lightheadedness and neck stiffness. Negative for weakness, altered level of consciousness , altered mental status, extremity weakness, paresthesias, involuntary movement, seizure and syncope.       Allergies  Prednisone and Tramadol  Home Medications   Current Outpatient Rx  Name Route Sig Dispense Refill  . CYCLOBENZAPRINE HCL 10 MG PO TABS Oral Take 10 mg by mouth 3 (three) times daily as needed. Muscle Spasm    . HYDROCODONE-ACETAMINOPHEN 5-325 MG PO TABS Oral Take 1 tablet by mouth every 6 (six) hours as needed. Pain    . LISINOPRIL-HYDROCHLOROTHIAZIDE 20-12.5 MG PO TABS Oral Take 0.5 tablets by mouth daily as needed.     . OXYCODONE-ACETAMINOPHEN 5-325 MG PO TABS Oral Take 1 tablet by mouth every 4 (four) hours as needed. Pain    . SENNA-DOCUSATE SODIUM 8.6-50 MG PO TABS Oral Take 2 tablets by mouth every evening.    . WARFARIN SODIUM 2 MG PO TABS Oral Take 2 mg by mouth daily.    . OXYCODONE-ACETAMINOPHEN 5-325 MG PO TABS  1 or 2 tabs PO q6h prn pain 25 tablet 0    BP 128/70  Pulse 109  Temp 98.3 F (36.8 C) (Oral)  Resp 18  Ht 5' 4.5" (1.638  m)  Wt 150 lb (68.04 kg)  BMI 25.35 kg/m2  SpO2 96%  Physical Exam 1505: Physical examination:  Nursing notes reviewed; Vital signs and O2 SAT reviewed;  Constitutional: Well developed, Well nourished, Well hydrated, In no acute distress; Head:  Normocephalic, atraumatic; Eyes: EOMI, PERRL, No scleral icterus; ENMT: Mouth and pharynx normal, Mucous membranes moist; Neck: Supple, Full range of motion, No lymphadenopathy; Cardiovascular: Regular rate and rhythm, No murmur, rub, or gallop; Respiratory: Breath sounds clear & equal bilaterally, No rales, rhonchi, wheezes.  Speaking full sentences with ease, Normal respiratory effort/excursion; Chest: Nontender, Movement normal;;  Extremities: Pulses normal, No tenderness, +1 left pedal edema with calf asymmetry.  No ecchymosis or rash. LLE muscle compartments soft.; Neuro: AA&Ox3, Major CN grossly intact.  Speech clear. Gait steady. No gross focal motor or sensory deficits in extremities.; Skin: Color normal, Warm, Dry.    ED Course  Procedures   MDM  MDM Reviewed: previous chart, vitals and nursing note Reviewed previous: ultrasound and CT scan Interpretation: ultrasound     Ct Angio Chest Pe W/cm &/or Wo Cm 04/20/2012  *RADIOLOGY REPORT*  Clinical Data: Lower extremity swelling and hypertension status post recent surgery. Known DVT on reported Doppler ultrasound.  CT ANGIOGRAPHY CHEST  Technique:  Multidetector CT imaging of the chest using the standard protocol during bolus administration of intravenous contrast. Multiplanar reconstructed images including MIPs were obtained and reviewed to evaluate the vascular anatomy.  Contrast: 80mL OMNIPAQUE IOHEXOL 350 MG/ML SOLN  Comparison: None available.  Findings: The pulmonary arteries are well opacified with contrast. The study is positive for acute bilateral pulmonary emboli.  The emboli are slightly larger and more extensive on the left.  The central pulmonary arteries are patent. There is no evidence of right ventricular strain.  The thoracic aorta appears normal.  There is no mediastinal or hilar adenopathy.  There is no pleural or pericardial effusion.  Mild dependent atelectasis is present in both lungs.  There is no confluent airspace opacity.  There are no acute osseous findings.  Visualized upper abdomen appears unremarkable.  IMPRESSION:  1.  Study is positive for moderate acute pulmonary embolism bilaterally. 2.  Mild atelectasis in both lungs. 3.  No pleural effusion or confluent airspace opacity.  Critical Value/emergent results were called by telephone at the time of interpretation on 04/20/2012 at 1930 hours to Dr. Patria Mane, who verbally acknowledged these results.    Original Report Authenticated By: Gerrianne Scale, M.D.    US Venous Img Lower Unilateral Left 05/12/2012  *RADIOLOGY REPORT*  Clinical Data: Pain, edema, history of recent PE  LEFT LOWER EXTREMITY VENOUS DUPLEX ULTRASOUND  Technique:  Gray-scale sonography with graded compression, as well as color Doppler and duplex ultrasound, were performed to evaluate the deep venous system of the lower extremity from the level of the common femoral vein through the popliteal and proximal calf veins. Spectral Doppler was utilized to evaluate flow at rest and with distal augmentation maneuvers.  Comparison:  None.  Findings: Ultrasound examination of the left lower extremity shows thrombus with noncompressibility left common femoral vein extending in the femoral vein down to popliteal and posterior tibial vein. Findings are consistent with extensive deep vein thrombosis.  IMPRESSION: Extensive deep vein thrombosis left lower extremity starting left common femoral vein, femoral vein, popliteal vein down to the posterior tibial vein.  This was made a call report.   Original Report Authenticated By: Natasha Mead, M.D.    ------------------------------------------------------------ Bilateral Lower Extremity Venous Duplex Evaluation  Study Date: 04/20/2012 ------------------------------------------------------------ History and indications: Indications 729.5 Pain in limb. 729.81 Swelling of limb. History Diagnostic evaluation. ------------------------------------------------------------ Left peroneal vein was NOT visualized (per table)  Summary: Findings consistent with acute deep vein thrombosis involving the left common femoral vein, left profunda femoris vein, left femoral vein, left popliteal vein, left posterial tibial vein, and left peroneal vein. Other specific details can be found in the table(s) above. Prepared and Electronically Authenticated by Gretta Began 2013-09-04T16:47:27.877    1545:  Pt does not  want her INR rechecked today.  States she was 2.4 on 05/04/12 (8 days ago) and has an appt on Tues (in 6 days) for a recheck.  Denies any other complaints today (CP, SOB).  Denies sustained increased swelling in LLE.  NMS intact LLE.  Vasc US LLE from today and 04/20/12 appear to have the same clot present (femoral to posterior tibial veins).  Pt wants to go home now. Is requesting refill of her percocet. Dx testing d/w pt and family.  Questions answered.  Verb understanding, agreeable to d/c home with outpt f/u.      Laray Anger, DO 05/15/12 1315

## 2012-05-12 NOTE — ED Notes (Signed)
Pt diagnosed with LLE DVT s/p back surgery; had INR drawn on 17th (2.4).  LLE continues to worsen with swelling; had [place] LLE done today at this hospital and blood clot is still there.

## 2012-05-12 NOTE — ED Notes (Signed)
Pt was dx with DVT in LLE on April 19 2012, and was treated. Pt states that she has been told that "it will take care of itself" but states that she is still having DVT pain in LLE. Pt states she had her PT/INR checked last Tuesday, and her level was therapeutic. Pt reports pain of 5/10 and describes it as an ache.

## 2012-05-12 NOTE — ED Notes (Signed)
Pt waiting for consulting provider. 

## 2012-05-18 ENCOUNTER — Inpatient Hospital Stay (HOSPITAL_COMMUNITY): Payer: BC Managed Care – PPO

## 2012-05-18 ENCOUNTER — Inpatient Hospital Stay (HOSPITAL_COMMUNITY)
Admission: EM | Admit: 2012-05-18 | Discharge: 2012-05-27 | DRG: 110 | Disposition: A | Discharge status: Rehabilitation Facility | Payer: BC Managed Care – PPO | Attending: Internal Medicine | Admitting: Internal Medicine

## 2012-05-18 ENCOUNTER — Encounter (HOSPITAL_COMMUNITY): Payer: Self-pay

## 2012-05-18 DIAGNOSIS — D473 Essential (hemorrhagic) thrombocythemia: Secondary | ICD-10-CM | POA: Diagnosis present

## 2012-05-18 DIAGNOSIS — E538 Deficiency of other specified B group vitamins: Secondary | ICD-10-CM | POA: Diagnosis present

## 2012-05-18 DIAGNOSIS — Z981 Arthrodesis status: Secondary | ICD-10-CM

## 2012-05-18 DIAGNOSIS — K5900 Constipation, unspecified: Secondary | ICD-10-CM | POA: Diagnosis present

## 2012-05-18 DIAGNOSIS — Z79899 Other long term (current) drug therapy: Secondary | ICD-10-CM

## 2012-05-18 DIAGNOSIS — K219 Gastro-esophageal reflux disease without esophagitis: Secondary | ICD-10-CM | POA: Diagnosis present

## 2012-05-18 DIAGNOSIS — M7989 Other specified soft tissue disorders: Secondary | ICD-10-CM

## 2012-05-18 DIAGNOSIS — R5381 Other malaise: Secondary | ICD-10-CM | POA: Diagnosis present

## 2012-05-18 DIAGNOSIS — E611 Iron deficiency: Secondary | ICD-10-CM | POA: Diagnosis present

## 2012-05-18 DIAGNOSIS — K922 Gastrointestinal hemorrhage, unspecified: Secondary | ICD-10-CM | POA: Diagnosis present

## 2012-05-18 DIAGNOSIS — E876 Hypokalemia: Secondary | ICD-10-CM | POA: Diagnosis present

## 2012-05-18 DIAGNOSIS — Z86718 Personal history of other venous thrombosis and embolism: Secondary | ICD-10-CM

## 2012-05-18 DIAGNOSIS — Z8042 Family history of malignant neoplasm of prostate: Secondary | ICD-10-CM

## 2012-05-18 DIAGNOSIS — Z806 Family history of leukemia: Secondary | ICD-10-CM

## 2012-05-18 DIAGNOSIS — Z720 Tobacco use: Secondary | ICD-10-CM | POA: Diagnosis present

## 2012-05-18 DIAGNOSIS — Z9889 Other specified postprocedural states: Secondary | ICD-10-CM

## 2012-05-18 DIAGNOSIS — R531 Weakness: Secondary | ICD-10-CM

## 2012-05-18 DIAGNOSIS — D72829 Elevated white blood cell count, unspecified: Secondary | ICD-10-CM

## 2012-05-18 DIAGNOSIS — D62 Acute posthemorrhagic anemia: Secondary | ICD-10-CM | POA: Diagnosis present

## 2012-05-18 DIAGNOSIS — I959 Hypotension, unspecified: Secondary | ICD-10-CM

## 2012-05-18 DIAGNOSIS — D75839 Thrombocytosis, unspecified: Secondary | ICD-10-CM | POA: Diagnosis present

## 2012-05-18 DIAGNOSIS — I824Z9 Acute embolism and thrombosis of unspecified deep veins of unspecified distal lower extremity: Secondary | ICD-10-CM | POA: Diagnosis present

## 2012-05-18 DIAGNOSIS — K602 Anal fissure, unspecified: Secondary | ICD-10-CM | POA: Diagnosis present

## 2012-05-18 DIAGNOSIS — R000 Tachycardia, unspecified: Secondary | ICD-10-CM

## 2012-05-18 DIAGNOSIS — I10 Essential (primary) hypertension: Secondary | ICD-10-CM | POA: Diagnosis present

## 2012-05-18 DIAGNOSIS — Z86711 Personal history of pulmonary embolism: Secondary | ICD-10-CM

## 2012-05-18 DIAGNOSIS — K5909 Other constipation: Secondary | ICD-10-CM | POA: Diagnosis present

## 2012-05-18 DIAGNOSIS — N179 Acute kidney failure, unspecified: Secondary | ICD-10-CM

## 2012-05-18 DIAGNOSIS — F17200 Nicotine dependence, unspecified, uncomplicated: Secondary | ICD-10-CM | POA: Diagnosis present

## 2012-05-18 DIAGNOSIS — I82409 Acute embolism and thrombosis of unspecified deep veins of unspecified lower extremity: Secondary | ICD-10-CM

## 2012-05-18 DIAGNOSIS — A419 Sepsis, unspecified organism: Secondary | ICD-10-CM

## 2012-05-18 DIAGNOSIS — R509 Fever, unspecified: Secondary | ICD-10-CM | POA: Diagnosis present

## 2012-05-18 DIAGNOSIS — E871 Hypo-osmolality and hyponatremia: Secondary | ICD-10-CM

## 2012-05-18 DIAGNOSIS — D649 Anemia, unspecified: Secondary | ICD-10-CM

## 2012-05-18 DIAGNOSIS — D6859 Other primary thrombophilia: Secondary | ICD-10-CM | POA: Diagnosis present

## 2012-05-18 DIAGNOSIS — Z7901 Long term (current) use of anticoagulants: Secondary | ICD-10-CM

## 2012-05-18 DIAGNOSIS — K648 Other hemorrhoids: Secondary | ICD-10-CM | POA: Diagnosis present

## 2012-05-18 DIAGNOSIS — I2699 Other pulmonary embolism without acute cor pulmonale: Secondary | ICD-10-CM

## 2012-05-18 DIAGNOSIS — I824Y9 Acute embolism and thrombosis of unspecified deep veins of unspecified proximal lower extremity: Principal | ICD-10-CM | POA: Diagnosis present

## 2012-05-18 DIAGNOSIS — K644 Residual hemorrhoidal skin tags: Secondary | ICD-10-CM | POA: Diagnosis present

## 2012-05-18 HISTORY — DX: Dorsalgia, unspecified: M54.9

## 2012-05-18 LAB — URINALYSIS, ROUTINE W REFLEX MICROSCOPIC
Bilirubin Urine: NEGATIVE
Bilirubin Urine: NEGATIVE
Glucose, UA: NEGATIVE mg/dL
Glucose, UA: NEGATIVE mg/dL
Ketones, ur: 15 mg/dL — AB
Ketones, ur: 15 mg/dL — AB
Nitrite: NEGATIVE
Nitrite: NEGATIVE
Protein, ur: 30 mg/dL — AB
Protein, ur: NEGATIVE mg/dL
Specific Gravity, Urine: 1.016 (ref 1.005–1.030)
Specific Gravity, Urine: 1.04 — ABNORMAL HIGH (ref 1.005–1.030)
Urobilinogen, UA: 1 mg/dL (ref 0.0–1.0)
Urobilinogen, UA: 0.2 mg/dL (ref 0.0–1.0)
pH: 8 (ref 5.0–8.0)
pH: 6.5 (ref 5.0–8.0)

## 2012-05-18 LAB — URINE MICROSCOPIC-ADD ON

## 2012-05-18 LAB — COMPREHENSIVE METABOLIC PANEL
ALT: 9 U/L (ref 0–35)
AST: 14 U/L (ref 0–37)
Albumin: 1.9 g/dL — ABNORMAL LOW (ref 3.5–5.2)
Alkaline Phosphatase: 135 U/L — ABNORMAL HIGH (ref 39–117)
BUN: 11 mg/dL (ref 6–23)
CO2: 28 mEq/L (ref 19–32)
Calcium: 8.9 mg/dL (ref 8.4–10.5)
Chloride: 89 mEq/L — ABNORMAL LOW (ref 96–112)
Creatinine, Ser: 0.68 mg/dL (ref 0.50–1.10)
GFR calc Af Amer: >90 mL/min (ref >90)
GFR calc non Af Amer: >90 mL/min (ref >90)
Glucose, Bld: 116 mg/dL — ABNORMAL HIGH (ref 70–99)
Potassium: 3.5 mEq/L (ref 3.5–5.1)
Sodium: 131 mEq/L — ABNORMAL LOW (ref 135–145)
Total Bilirubin: 0.5 mg/dL (ref 0.3–1.2)
Total Protein: 6.8 g/dL (ref 6.0–8.3)

## 2012-05-18 LAB — CBC WITH DIFFERENTIAL/PLATELET
Basophils Absolute: 0 10*3/uL (ref 0.0–0.1)
Basophils Relative: 0 % (ref 0–1)
Eosinophils Absolute: 0 10*3/uL (ref 0.0–0.7)
Eosinophils Relative: 0 % (ref 0–5)
HCT: 20.9 % — ABNORMAL LOW (ref 36.0–46.0)
Hemoglobin: 6.9 g/dL — CL (ref 12.0–15.0)
Lymphocytes Relative: 6 % — ABNORMAL LOW (ref 12–46)
Lymphs Abs: 1.2 10*3/uL (ref 0.7–4.0)
MCH: 27.3 pg (ref 26.0–34.0)
MCHC: 33 g/dL (ref 30.0–36.0)
MCV: 82.6 fL (ref 78.0–100.0)
Monocytes Absolute: 1.4 10*3/uL — ABNORMAL HIGH (ref 0.1–1.0)
Monocytes Relative: 7 % (ref 3–12)
Neutro Abs: 18.8 10*3/uL — ABNORMAL HIGH (ref 1.7–7.7)
Neutrophils Relative %: 88 % — ABNORMAL HIGH (ref 43–77)
Platelets: 599 10*3/uL — ABNORMAL HIGH (ref 150–400)
RBC: 2.53 MIL/uL — ABNORMAL LOW (ref 3.87–5.11)
RDW: 16 % — ABNORMAL HIGH (ref 11.5–15.5)
WBC: 21.4 10*3/uL — ABNORMAL HIGH (ref 4.0–10.5)

## 2012-05-18 LAB — ABO/RH: ABO/RH(D): A POS

## 2012-05-18 LAB — PROTIME-INR
INR: 2.29 — ABNORMAL HIGH (ref 0.00–1.49)
Prothrombin Time: 24.2 seconds — ABNORMAL HIGH (ref 11.6–15.2)

## 2012-05-18 LAB — LACTIC ACID, PLASMA: Lactic Acid, Venous: 0.9 mmol/L (ref 0.5–2.2)

## 2012-05-18 LAB — RETICULOCYTES
RBC.: 2.44 MIL/uL — ABNORMAL LOW (ref 3.87–5.11)
RBC.: 2.45 MIL/uL — ABNORMAL LOW (ref 3.87–5.11)
Retic Count, Absolute: 41.5 10*3/uL (ref 19.0–186.0)
Retic Count, Absolute: 34.3 10*3/uL (ref 19.0–186.0)
Retic Ct Pct: 1.7 % (ref 0.4–3.1)
Retic Ct Pct: 1.4 % (ref 0.4–3.1)

## 2012-05-18 LAB — HEMOGLOBIN AND HEMATOCRIT, BLOOD
HCT: 20.2 % — ABNORMAL LOW (ref 36.0–46.0)
Hemoglobin: 6.5 g/dL — CL (ref 12.0–15.0)

## 2012-05-18 LAB — PREPARE RBC (CROSSMATCH)

## 2012-05-18 LAB — MRSA PCR SCREENING: MRSA by PCR: NEGATIVE

## 2012-05-18 LAB — OCCULT BLOOD, POC DEVICE: Fecal Occult Bld: POSITIVE

## 2012-05-18 MED ORDER — MIDAZOLAM HCL 5 MG/5ML IJ SOLN
INTRAMUSCULAR | Status: AC | PRN
  Administered 2012-05-18 (×3): 1 mg via INTRAVENOUS

## 2012-05-18 MED ORDER — FENTANYL CITRATE 0.05 MG/ML IJ SOLN
INTRAMUSCULAR | Status: AC
  Filled 2012-05-18: qty 2

## 2012-05-18 MED ORDER — IOHEXOL 300 MG/ML  SOLN
150.0000 mL | Freq: Once | INTRAMUSCULAR | Status: AC | PRN
  Administered 2012-05-18: 100 mL via INTRAVENOUS

## 2012-05-18 MED ORDER — IOHEXOL 300 MG/ML  SOLN
20.0000 mL | INTRAMUSCULAR | Status: AC
  Administered 2012-05-18 (×2): 20 mL via ORAL

## 2012-05-18 MED ORDER — SODIUM CHLORIDE 0.9 % IV SOLN: INTRAVENOUS | Status: DC

## 2012-05-18 MED ORDER — MIDAZOLAM HCL 2 MG/2ML IJ SOLN
INTRAMUSCULAR | Status: AC
  Filled 2012-05-18: qty 2

## 2012-05-18 MED ORDER — ACETAMINOPHEN 650 MG RE SUPP: 650.0000 mg | Freq: Four times a day (QID) | RECTAL | Status: DC | PRN

## 2012-05-18 MED ORDER — DEXTROSE 5 % IV SOLN
1.0000 g | Freq: Three times a day (TID) | INTRAVENOUS | Status: DC
  Administered 2012-05-18 – 2012-05-19 (×3): 1 g via INTRAVENOUS
  Filled 2012-05-18 (×5): qty 1

## 2012-05-18 MED ORDER — SODIUM CHLORIDE 0.9 % IV SOLN
Freq: Once | INTRAVENOUS | Status: AC
  Administered 2012-05-18: 16:00:00 via INTRAVENOUS

## 2012-05-18 MED ORDER — HEPARIN (PORCINE) IN NACL 100-0.45 UNIT/ML-% IJ SOLN
700.0000 [IU]/h | INTRAMUSCULAR | Status: DC
  Administered 2012-05-18: 700 [IU]/h via INTRAVENOUS
  Filled 2012-05-18: qty 250

## 2012-05-18 MED ORDER — ONDANSETRON HCL 4 MG PO TABS: 4.0000 mg | ORAL_TABLET | Freq: Four times a day (QID) | ORAL | Status: DC | PRN

## 2012-05-18 MED ORDER — ONDANSETRON HCL 4 MG/2ML IJ SOLN
4.0000 mg | Freq: Four times a day (QID) | INTRAMUSCULAR | Status: DC | PRN
  Administered 2012-05-19: 4 mg via INTRAVENOUS
  Filled 2012-05-18: qty 2

## 2012-05-18 MED ORDER — FENTANYL CITRATE 0.05 MG/ML IJ SOLN
INTRAMUSCULAR | Status: AC | PRN
  Administered 2012-05-18 (×3): 50 ug via INTRAVENOUS

## 2012-05-18 MED ORDER — ALBUTEROL SULFATE (5 MG/ML) 0.5% IN NEBU: 2.5000 mg | INHALATION_SOLUTION | RESPIRATORY_TRACT | Status: DC | PRN

## 2012-05-18 MED ORDER — SODIUM CHLORIDE 0.9 % IV BOLUS (SEPSIS)
1000.0000 mL | Freq: Once | INTRAVENOUS | Status: AC
  Administered 2012-05-18: 1000 mL via INTRAVENOUS

## 2012-05-18 MED ORDER — ACETAMINOPHEN 325 MG PO TABS
650.0000 mg | ORAL_TABLET | Freq: Four times a day (QID) | ORAL | Status: DC | PRN
  Administered 2012-05-19 – 2012-05-21 (×2): 650 mg via ORAL
  Filled 2012-05-18 (×2): qty 2

## 2012-05-18 MED ORDER — HYDROCODONE-ACETAMINOPHEN 5-325 MG PO TABS
1.0000 | ORAL_TABLET | ORAL | Status: DC | PRN
  Administered 2012-05-19 – 2012-05-26 (×16): 2 via ORAL
  Administered 2012-05-27: 1 via ORAL
  Filled 2012-05-18 (×17): qty 2

## 2012-05-18 MED ORDER — PANTOPRAZOLE SODIUM 40 MG IV SOLR
40.0000 mg | Freq: Two times a day (BID) | INTRAVENOUS | Status: DC
  Administered 2012-05-18 – 2012-05-27 (×18): 40 mg via INTRAVENOUS
  Filled 2012-05-18 (×20): qty 40

## 2012-05-18 MED ORDER — PEG 3350-KCL-NA BICARB-NACL 420 G PO SOLR
4000.0000 mL | Freq: Once | ORAL | Status: AC
  Administered 2012-05-19: 4000 mL via ORAL
  Filled 2012-05-18: qty 4000

## 2012-05-18 MED ORDER — HYDROMORPHONE HCL PF 1 MG/ML IJ SOLN
1.0000 mg | INTRAMUSCULAR | Status: DC | PRN
  Administered 2012-05-18 – 2012-05-25 (×5): 1 mg via INTRAVENOUS
  Filled 2012-05-18 (×5): qty 1

## 2012-05-18 MED ORDER — ALUM & MAG HYDROXIDE-SIMETH 200-200-20 MG/5ML PO SUSP: 30.0000 mL | Freq: Four times a day (QID) | ORAL | Status: DC | PRN

## 2012-05-18 MED ORDER — SODIUM CHLORIDE 0.9 % IV SOLN
INTRAVENOUS | Status: DC
  Administered 2012-05-18 (×2): via INTRAVENOUS
  Administered 2012-05-19: 125 mL/h via INTRAVENOUS
  Administered 2012-05-20 – 2012-05-24 (×5): via INTRAVENOUS

## 2012-05-18 NOTE — Progress Notes (Addendum)
Discussed with IR, Dr Grace Isaac, successful IVC filter placement but patient has extensive near occlusive thrombus extending throughout IVC. IVC filter was placed suprarenal. Question why she is in such hypercoagulable state? She may benefit from catheter directed thrombolysis and can be done at [facility] per Dr Grace Isaac. d/w PCCM (Dr Sung Amabile). I have ordered CT abd and pelvis to assess for any malignancy, any issue with the hardware (infection/abscess) given SIRS and leukocytosis. I have called for hematology consult for assisting with this complicated case (awaiting response).   Eriel Doyon M.D. [facility] 05/18/2012, 6:55 PM  Pager: [phone]    D/w Dr Lowry Ram (Hematology) in detail, will eval patient.   Lynae Pederson M.D. [facility] 05/18/2012, 7:28 PM  Pager: [phone]

## 2012-05-18 NOTE — ED Notes (Signed)
Attempted to call report RN not available to take at the present.

## 2012-05-18 NOTE — Procedures (Signed)
Successful placement of an suprrarenal IVC filter secondary to extensive near occlusive thrombus extending throughout the IVC with non-visualization of the left renal vein.  No immediate complications.

## 2012-05-18 NOTE — Consult Note (Signed)
PULMONARY/CCM CONSULT NOTE  Requesting MD/Service: [facility] Date of admission:  10/01 Date of consult: 10/01 Reason for consultation: recent DVT/PE. Acute LGIB  Pt Profile:  56 year old female with spinal fusion surgery in [facility] on August 19th, subsequently had left leg DVT with bilateral pulmonary embolism in first week of September and was discharged on Lovenox and Coumadin on 04/23/2012. Admitted 10/01 to [facility] service with hematochezia, light headedness, new onset RLE edema, anemia (Hgb 6.9) and mild hypotension. PCCM asked to assist in eval and mgmt. Her original DVT was in LLE. Venous duplex study on day of this admission revealed new extensive DVT in RLE. She indicates that she has been therapeutic or supra-therapeutic on all pro-time checks since discharge from hospital 9/6  HPI: as above. She has had no respiratory symptoms, specifically no CP, pleurodynia, DOE, hemoptysis or purulent sputum. She is alredy scheduled for IVC filter placement. GI consultation has been requested    Past Medical History  Diagnosis Date  . DVT (deep venous thrombosis)   . Pulmonary embolism   . Back pain     MEDICATIONS:   History   Social History  . Marital Status: Married    Spouse Name: N/A    Number of Children: N/A  . Years of Education: N/A   Occupational History  . Not on file.   Social History Main Topics  . Smoking status: Current Every Day Smoker  . Smokeless tobacco: Not on file  . Alcohol Use: Yes     occ  . Drug Use: No  . Sexually Active:    Other Topics Concern  . Not on file   Social History Narrative  . No narrative on file    Family History  Problem Relation Age of Onset  . Leukemia Mother   . Prostate cancer Father     ROS - Denies unexplained wt loss. No breast lumps. ROS entirely negative except as per HPI  Filed Vitals:   05/18/12 1315 05/18/12 1347 05/18/12 1530 05/18/12 1600  BP:  124/66 130/62 123/59  Pulse:  109 111 112  Temp:        TempSrc:   Oral   Resp: 14 12 18 16   Height:   5\' 5"  (1.651 m)   Weight:   72.1 kg (158 lb 15.2 oz)   SpO2: 99% 99% 100% 100%    EXAM:  Gen: NAD HEENT: WNL Neck: No JVD Lungs: clear to ausc and perc Cardiovascular: tachy, regular, no murmurs Abdomen: soft, NT, NABS Musculoskeletal: warm, 2+ pitting edema symmetrically Neuro: intact   DATA:   BMET    Component Value Date/Time   NA 131* 05/18/2012 1113   K 3.5 05/18/2012 1113   CL 89* 05/18/2012 1113   CO2 28 05/18/2012 1113   GLUCOSE 116* 05/18/2012 1113   BUN 11 05/18/2012 1113   CREATININE 0.68 05/18/2012 1113   CALCIUM 8.9 05/18/2012 1113   GFRNONAA >90 05/18/2012 1113   GFRAA >90 05/18/2012 1113    CBC    Component Value Date/Time   WBC 21.4* 05/18/2012 1113   RBC 2.53* 05/18/2012 1113   HGB 6.9* 05/18/2012 1113   HCT 20.9* 05/18/2012 1113   PLT 599* 05/18/2012 1113   MCV 82.6 05/18/2012 1113   MCH 27.3 05/18/2012 1113   MCHC 33.0 05/18/2012 1113   RDW 16.0* 05/18/2012 1113   LYMPHSABS 1.2 05/18/2012 1113   MONOABS 1.4* 05/18/2012 1113   EOSABS 0.0 05/18/2012 1113   BASOSABS 0.0 05/18/2012 1113  CXR: NACPD EKG: ST, IRBBB, no acute ischemia, no S1Q3T3 RLE venous duplex (10/01): preliminary report > extensive RLE DVT  IMPRESSION:   1) Recent DVT/PE after back surgery 2) New RLE DVT despite therapeutic anticoagulation 3) Acute blood loss anemia with BRBPR  DISCUSSION: This is a very challenging case. The most troubling aspect of it is that she has had new and extensive DVT develop despite what sounds like therapeutic anticoagulation. Although she has never had previous thrombotic events, one would have to be concerned for a hypercoagulable state. These would include the usual inborn thrombophilic states as well as occult malignancies. She has never had a colonoscopy or mammography. Ultimately, even with IVC filter placement, it would be desirable to resume anticoagulation as soon as can be safely done to prevent long term  sequelae of bilateral DVTs (post-phlebitic syndrome)  PLAN/REC:  1) Agree with IVC filter placement 2) As she does not seem to be briskly bleeding, will not give FFP or vitamin K until we have a better idea of what the LGIB process is 3) A hypercoagulation panel should be sent once she is no longer affected by warfarin and acute thrombosis  4) Agree with GI consult and eval for LGIB - discussed with Dr Elnoria Howard. If there is an actively bleeding process, we will need to more aggressively reverse the warfarin 5) She should undergo mammography and full colonoscopy as the recommended screening procedures for occult malignancy   Billy Fischer, MD ; [place] [facility] Mobile [phone].  After 5:30 PM or weekends, call [phone]

## 2012-05-18 NOTE — H&P (Signed)
History and Physical       Hospital Admission Note Date: 05/18/2012  Patient name: [name] [name] Medical record number: [id number] Date of birth: 1956/01/16 Age: 56 y.o. Gender: female PCP: Rudi Heap, MD   Chief Complaint:  Presented with right leg pain, bleeding in the stools, dizzy  HPI: Patient is a 56 year old female with a recent history of spinal fusion surgery in [facility] on August 19th, subsequently had left leg DVT with bilateral pulmonary embolism in first week of September and was discharged on Lovenox and Coumadin on 04/23/2012. Patient has been taking Coumadin, head followup with her PCP 2 weeks ago and INR was 2.4. In the last 3 days patient started having nausea and vomiting, unable to hold anything down. She also noticed bright red blood per rectum and thought it was probably from hemorrhoids on Wed and Thursday for 2 days, then she has been constipated after that. Then 2 days ago she started having pain in her right leg and swelling, today felt dizzy and lightheaded, came to the ED.  In the workup showed hemoglobin of 6.9, guiac stool test positive, hypotensive and orthostatic with lowest blood pressure 91/59 and tachycardiac, thrombocytosis, leukocytosis of unclear etiology 21.4. INR is 2.29. Initial urine pregnancy test was labeled as positive, but per EDP, this is incorrect and they are unable to erase it.   Review of Systems:  Constitutional: Denies fever, chills, diaphoresis, + has not been eating well and fatigue.  HEENT: Denies photophobia, eye pain, redness, hearing loss, ear pain, congestion, sore throat, rhinorrhea, sneezing, mouth sores, trouble swallowing, neck pain, neck stiffness and tinnitus.   Respiratory: Denies SOB, DOE, cough, chest tightness,  and wheezing.   Cardiovascular: Denies chest pain, palpitations. + Right leg swelling.  Gastrointestinal: Please see HPI Genitourinary: Denies dysuria,  urgency, frequency, hematuria, flank pain and difficulty urinating.  Musculoskeletal: Denies myalgias, back pain, joint swelling, arthralgias and gait problem.  Skin: Denies pallor, rash and wound.  Neurological: Felt dizzy and lightheaded today, no syncope or seizures Hematological: Denies adenopathy. Easy bruising, personal or family bleeding history  Psychiatric/Behavioral: Denies suicidal ideation, mood changes, confusion, nervousness, sleep disturbance and agitation  Past Medical History: Past Medical History  Diagnosis Date  . DVT (deep venous thrombosis)   . Pulmonary embolism   . Back pain    Past Surgical History  Procedure Date  . Back surgery     Medications: Prior to Admission medications   Medication Sig Start Date End Date Taking? Authorizing Provider  bisacodyl (DULCOLAX) 5 MG EC tablet Take 15 mg by mouth daily as needed. constipation   Yes Historical Provider, MD  lisinopril-hydrochlorothiazide (PRINZIDE,ZESTORETIC) 20-12.5 MG per tablet Take 0.5 tablets by mouth daily as needed. If blood pressure greater than 135/90   Yes Historical Provider, MD  oxyCODONE-acetaminophen (PERCOCET/ROXICET) 5-325 MG per tablet Take 2 tablets by mouth every 6 (six) hours as needed. pain   Yes Historical Provider, MD  sennosides-docusate sodium (SENOKOT-S) 8.6-50 MG tablet Take 2 tablets by mouth every evening.   Yes Historical Provider, MD  warfarin (COUMADIN) 2 MG tablet Take 2 mg by mouth daily.   Yes Historical Provider, MD    Allergies:   Allergies  Allergen Reactions  . Prednisone Other (See Comments)    No energy "stalls out"  . Tramadol Other (See Comments)    constipation    Social History:  reports that she has been smoking.  She does not have any smokeless tobacco history on file. She reports that  she drinks alcohol. She reports that she does not use illicit drugs.  Family History: Family History  Problem Relation Age of Onset  . Leukemia Mother   . Prostate  cancer Father     Physical Exam: Blood pressure 124/66, pulse 109, temperature 98.8 F (37.1 C), temperature source Oral, resp. rate 12, height 5\' 4"  (1.626 m), weight 65.772 kg (145 lb), SpO2 99.00%. General: Alert, awake, oriented x3, in no acute distress. HEENT: normocephalic, atraumatic, anicteric sclera, pink conjunctiva, pupils equal and reactive to light and accomodation, oropharynx clear Neck: supple, no masses or lymphadenopathy, no goiter, no bruits  Heart: Regular rate and rhythm, without murmurs, rubs or gallops. Lungs: Clear to auscultation bilaterally, no wheezing, rales or rhonchi. Abdomen: Soft, nontender, nondistended, positive bowel sounds, no masses. Extremities: No clubbing, cyanosis or edema with positive pedal pulses. Neuro: Grossly intact, no focal neurological deficits Psych: alert and oriented x 3, normal mood and affect Skin: no rashes or lesions, warm and dry   LABS on Admission:  Basic Metabolic Panel:  Lab 05/18/12 0981  NA 131*  K 3.5  CL 89*  CO2 28  GLUCOSE 116*  BUN 11  CREATININE 0.68  CALCIUM 8.9  MG --  PHOS --   Liver Function Tests:  Lab 05/18/12 1113  AST 14  ALT 9  ALKPHOS 135*  BILITOT 0.5  PROT 6.8  ALBUMIN 1.9*  CBC:  Lab 05/18/12 1113  WBC 21.4*  NEUTROABS 18.8*  HGB 6.9*  HCT 20.9*  MCV 82.6  PLT 599*     Radiological Exams on Admission: Ct Angio Chest Pe W/cm &/or Wo Cm  04/20/2012  IMPRESSION:  1.  Study is positive for moderate acute pulmonary embolism bilaterally. 2.  Mild atelectasis in both lungs. 3.  No pleural effusion or confluent airspace opacity.  Critical Value/emergent results were called by telephone at the time of interpretation on 04/20/2012 at 1930 hours to Dr. Patria Mane, who verbally acknowledged these results.   Original Report Authenticated By: Gerrianne Scale, M.D.    US Venous Img Lower Unilateral Left  05/12/2012  *RADIOLOGY REPORT*  Clinical Data: Pain, edema, history of recent PE  LEFT LOWER  EXTREMITY VENOUS DUPLEX ULTRASOUND  Technique:  Gray-scale sonography with graded compression, as well as color Doppler and duplex ultrasound, were performed to evaluate the deep venous system of the lower extremity from the level of the common femoral vein through the popliteal and proximal calf veins. Spectral Doppler was utilized to evaluate flow at rest and with distal augmentation maneuvers.  Comparison:  None.  Findings: Ultrasound examination of the left lower extremity shows thrombus with noncompressibility left common femoral vein extending in the femoral vein down to popliteal and posterior tibial vein. Findings are consistent with extensive deep vein thrombosis.  IMPRESSION: Extensive deep vein thrombosis left lower extremity starting left common femoral vein, femoral vein, popliteal vein down to the posterior tibial vein.  This was made a call report.   Original Report Authenticated By: Natasha Mead, M.D.    Dg Abd Acute W/chest  05/18/2012  *RADIOLOGY REPORT*  Clinical Data: Sepsis and abdominal pain.  ACUTE ABDOMEN SERIES (ABDOMEN 2 VIEW & CHEST 1 VIEW)  Comparison: None  Findings: The cardiomediastinal silhouette is unremarkable. The lungs are clear. There is no of airspace disease, pleural effusion or pneumothorax.  Moderate stool in the ascending colon noted. Gas is present in the remainder of the colon and rectum. A few nondistended gas-filled loops small bowel within the left abdomen  are identified. There is no evidence of bowel obstruction or pneumoperitoneum. Postoperative changes of the lower lumbar spine are present.  IMPRESSION: Nonspecific nonobstructive bowel gas pattern - no evidence of pneumoperitoneum.  No evidence of active cardiopulmonary disease.   Original Report Authenticated By: Rosendo Gros, M.D.    Doppler ultrasound of the right lower extremity: 05/18/2012 Right lower extremity is positive for deep vein thrombosis involving the right saphenofemoral junction, common femoral,  femoral, popliteal and posterior tibial veins.    Assessment/Plan Present on Admission:   .Acute blood loss anemia in the setting of anticoagulation for bilateral PE, left DVT and now acute right DVT, stool occult test positive.  - Admit to step down, transfuse 2 units packed RBCs, aggressive IV fluid hydration - Patient will need IVC filter at this time, discussed with interventional radiology in detail and pulmonary critical care, Dr Delford Field.  - No anticoagulation at this time, will need to discuss with hematology after IVC filter if any role of newer anticoagulation agents or ?thrombolysis      .Lower GI bleed: BRBPR in 2 days - Discussed with GI (Dr Elnoria Howard), will need further evaluation for bleeding source, on PPI - Cont 2 units pRBC's transfusion, serial H/H  .Leukocytosis with thrombocytosis, hypotension/SIRS  - Patient is not febrile but cont aggressive IV fluid hydration, obtain blood cultures, urine culture, procalcitonin and lactic acid. I am holding off on any antibiotics until CCM evaluation and source clear. - Thrombocytosis could be reactive from severe anemia  .Tobacco abuse - place on nicotine patch  .Pulmonary embolism, bilateral- moderate clot burden, Bilateral DVT - IVC filter for now, see #1  DVT prophylaxis: INR 2.29  CODE STATUS: FULL code status  Further plan will depend as patient's clinical course evolves and further radiologic and laboratory data become available.   Time Spent on Admission: 90 mins  Aiana Nordquist M.D. [facility] 05/18/2012, 3:20 PM Pager: [phone]  If 7PM-7AM, please contact night-coverage [email or web address] Password [email or web address]

## 2012-05-18 NOTE — ED Notes (Addendum)
Notified Natalia PA of critical Hemoglobin of 6.9 as well as a positive [place] for DVT.

## 2012-05-18 NOTE — ED Notes (Signed)
Pt to room 3 via [facility] with c/o of swelling to right lower leg, aching feeling to upper thigh and out knee area. Was due to have coumadin level checked today which is 2 weeks out. Levels were okay 2 weeks ago. Pt had a spinal fusion on 8/19 came back on 9/3 for DVT with PE to left leg. States she is having the same feelings in the right leg this time. ST on the monitor, 76/44 initially sitting down with some dizziness, given an 800 ml bolus with BP 120/74. 20g to RAC. Decreased appetite, swelling to right lower extremity even with keeping elevated and dizziness.

## 2012-05-18 NOTE — Progress Notes (Addendum)
Disposition Note  Lerline Valdivia, is a 56 y.o. female,   MRN: [id number]  -  DOB - 03-Jun-1956  Outpatient Primary MD for the patient is Rudi Heap, MD   Blood pressure 91/59, pulse 115, temperature 98.8 F (37.1 C), temperature source Oral, resp. rate 14, height 5\' 4"  (1.626 m), weight 65.772 kg (145 lb), SpO2 99.00%.  Active Problems:  Sepsis  Lower GI bleed  DVT of leg (deep venous thrombosis)  History of back surgery  Leukocytosis  Sinus tachycardia   56 yo female who had back surgery in August, then developed a LL ext Dvt and subsequently a PE in Sept.  She is on coumadin for these.    She presented to the ED today for weakness, dizzyness and RLE pain and swelling.  Doppler U/S reveals a new RLE DVT.  Further her hgb is slowly dropping and she is guiac positive (brown stool, bright red blood on glove, hemorrhoid?).  She is also orthostatic with BP dropping to 91/59 on standing.  Labs demonstrate marked leukocytosis, elevated platelets and hyponatremia.  I have requested a stat acute abdominal series, urinalysis, and lactic acid.  As well as a step down bed for possible sepsis, gi bleed on coumadin, with new acute RLE DVT.  Please note:  While epic lists a positive pregnancy test - THIS IS INCORRECT.  SHE IS GUIAC POSITIVE.  Algis Downs, PA-C [facility] Pager: [phone]

## 2012-05-18 NOTE — Progress Notes (Signed)
Patient ID: [name] [name], female   DOB: Jan 02, 1956, 56 y.o.   MRN: [id number] Request received for placement of IVC filter on pt with hx of lower GI bleed, anemia, and bilateral PE/DVT. Imaging studies reviewed by Dr. Grace Isaac. Additional PMH as below. Exam: chest - CTA bilat; heart- tachy but reg rhythm; abd- soft, +BS, NT; ext- FROM, bilat edema.    Filed Vitals:   05/18/12 1134 05/18/12 1315 05/18/12 1347 05/18/12 1530  BP: 91/59  124/66 130/62  Pulse: 115  109 111  Temp:      TempSrc:    Oral  Resp:  14 12 18   Height:      Weight:      SpO2:  99% 99% 100%   Past Medical History  Diagnosis Date  . DVT (deep venous thrombosis)   . Pulmonary embolism   . Back pain    Past Surgical History  Procedure Date  . Back surgery   Ct Angio Chest Pe W/cm &/or Wo Cm  04/20/2012  *RADIOLOGY REPORT*  Clinical Data: Lower extremity swelling and hypertension status post recent surgery. Known DVT on reported Doppler ultrasound.  CT ANGIOGRAPHY CHEST  Technique:  Multidetector CT imaging of the chest using the standard protocol during bolus administration of intravenous contrast. Multiplanar reconstructed images including MIPs were obtained and reviewed to evaluate the vascular anatomy.  Contrast: 80mL OMNIPAQUE IOHEXOL 350 MG/ML SOLN  Comparison: None available.  Findings: The pulmonary arteries are well opacified with contrast. The study is positive for acute bilateral pulmonary emboli.  The emboli are slightly larger and more extensive on the left.  The central pulmonary arteries are patent. There is no evidence of right ventricular strain.  The thoracic aorta appears normal.  There is no mediastinal or hilar adenopathy.  There is no pleural or pericardial effusion.  Mild dependent atelectasis is present in both lungs.  There is no confluent airspace opacity.  There are no acute osseous findings.  Visualized upper abdomen appears unremarkable.  IMPRESSION:  1.  Study is positive for moderate acute pulmonary  embolism bilaterally. 2.  Mild atelectasis in both lungs. 3.  No pleural effusion or confluent airspace opacity.  Critical Value/emergent results were called by telephone at the time of interpretation on 04/20/2012 at 1930 hours to Dr. Patria Mane, who verbally acknowledged these results.   Original Report Authenticated By: Gerrianne Scale, M.D.    US Venous Img Lower Unilateral Left  05/12/2012  *RADIOLOGY REPORT*  Clinical Data: Pain, edema, history of recent PE  LEFT LOWER EXTREMITY VENOUS DUPLEX ULTRASOUND  Technique:  Gray-scale sonography with graded compression, as well as color Doppler and duplex ultrasound, were performed to evaluate the deep venous system of the lower extremity from the level of the common femoral vein through the popliteal and proximal calf veins. Spectral Doppler was utilized to evaluate flow at rest and with distal augmentation maneuvers.  Comparison:  None.  Findings: Ultrasound examination of the left lower extremity shows thrombus with noncompressibility left common femoral vein extending in the femoral vein down to popliteal and posterior tibial vein. Findings are consistent with extensive deep vein thrombosis.  IMPRESSION: Extensive deep vein thrombosis left lower extremity starting left common femoral vein, femoral vein, popliteal vein down to the posterior tibial vein.  This was made a call report.   Original Report Authenticated By: Natasha Mead, M.D.    Dg Abd Acute W/chest  05/18/2012  *RADIOLOGY REPORT*  Clinical Data: Sepsis and abdominal pain.  ACUTE ABDOMEN SERIES (ABDOMEN  2 VIEW & CHEST 1 VIEW)  Comparison: None  Findings: The cardiomediastinal silhouette is unremarkable. The lungs are clear. There is no of airspace disease, pleural effusion or pneumothorax.  Moderate stool in the ascending colon noted. Gas is present in the remainder of the colon and rectum. A few nondistended gas-filled loops small bowel within the left abdomen are identified. There is no evidence of  bowel obstruction or pneumoperitoneum. Postoperative changes of the lower lumbar spine are present.  IMPRESSION: Nonspecific nonobstructive bowel gas pattern - no evidence of pneumoperitoneum.  No evidence of active cardiopulmonary disease.   Original Report Authenticated By: Rosendo Gros, M.D.   Results for orders placed during the hospital encounter of 05/18/12  CBC WITH DIFFERENTIAL      Component Value Range   WBC 21.4 (*) 4.0 - 10.5 K/uL   RBC 2.53 (*) 3.87 - 5.11 MIL/uL   Hemoglobin 6.9 (*) 12.0 - 15.0 g/dL   HCT 16.1 (*) 09.6 - 04.5 %   MCV 82.6  78.0 - 100.0 fL   MCH 27.3  26.0 - 34.0 pg   MCHC 33.0  30.0 - 36.0 g/dL   RDW 40.9 (*) 81.1 - 91.4 %   Platelets 599 (*) 150 - 400 K/uL   Neutrophils Relative 88 (*) 43 - 77 %   Neutro Abs 18.8 (*) 1.7 - 7.7 K/uL   Lymphocytes Relative 6 (*) 12 - 46 %   Lymphs Abs 1.2  0.7 - 4.0 K/uL   Monocytes Relative 7  3 - 12 %   Monocytes Absolute 1.4 (*) 0.1 - 1.0 K/uL   Eosinophils Relative 0  0 - 5 %   Eosinophils Absolute 0.0  0.0 - 0.7 K/uL   Basophils Relative 0  0 - 1 %   Basophils Absolute 0.0  0.0 - 0.1 K/uL  COMPREHENSIVE METABOLIC PANEL      Component Value Range   Sodium 131 (*) 135 - 145 mEq/L   Potassium 3.5  3.5 - 5.1 mEq/L   Chloride 89 (*) 96 - 112 mEq/L   CO2 28  19 - 32 mEq/L   Glucose, Bld 116 (*) 70 - 99 mg/dL   BUN 11  6 - 23 mg/dL   Creatinine, Ser 7.82  0.50 - 1.10 mg/dL   Calcium 8.9  8.4 - 95.6 mg/dL   Total Protein 6.8  6.0 - 8.3 g/dL   Albumin 1.9 (*) 3.5 - 5.2 g/dL   AST 14  0 - 37 U/L   ALT 9  0 - 35 U/L   Alkaline Phosphatase 135 (*) 39 - 117 U/L   Total Bilirubin 0.5  0.3 - 1.2 mg/dL   GFR calc non Af Amer >90  >90 mL/min   GFR calc Af Amer >90  >90 mL/min  PROTIME-INR      Component Value Range   Prothrombin Time 24.2 (*) 11.6 - 15.2 seconds   INR 2.29 (*) 0.00 - 1.49  URINALYSIS, ROUTINE W REFLEX MICROSCOPIC      Component Value Range   Color, Urine YELLOW  YELLOW   APPearance TURBID (*) CLEAR     Specific Gravity, Urine 1.016  1.005 - 1.030   pH 8.0  5.0 - 8.0   Glucose, UA NEGATIVE  NEGATIVE mg/dL   Hgb urine dipstick SMALL (*) NEGATIVE   Bilirubin Urine NEGATIVE  NEGATIVE   Ketones, ur 15 (*) NEGATIVE mg/dL   Protein, ur 30 (*) NEGATIVE mg/dL   Urobilinogen, UA 1.0  0.0 -  1.0 mg/dL   Nitrite NEGATIVE  NEGATIVE   Leukocytes, UA SMALL (*) NEGATIVE  TYPE AND SCREEN      Component Value Range   ABO/RH(D) A POS     Antibody Screen NEG     Sample Expiration 05/21/2012     Unit Number [id number]     Blood Component Type RED CELLS,LR     Unit division 00     Status of Unit ALLOCATED     Transfusion Status OK TO TRANSFUSE     Crossmatch Result Compatible     Unit Number [id number]     Blood Component Type RED CELLS,LR     Unit division 00     Status of Unit ALLOCATED     Transfusion Status OK TO TRANSFUSE     Crossmatch Result Compatible    POCT PREGNANCY, URINE      Component Value Range   Preg Test, Ur POSITIVE (*) NEGATIVE  OCCULT BLOOD, POC DEVICE      Component Value Range   Fecal Occult Bld POSITIVE    PREPARE RBC (CROSSMATCH)      Component Value Range   Order Confirmation ORDER PROCESSED BY BLOOD BANK    LACTIC ACID, PLASMA      Component Value Range   Lactic Acid, Venous 0.9  0.5 - 2.2 mmol/L  URINE MICROSCOPIC-ADD ON      Component Value Range   Squamous Epithelial / LPF RARE  RARE   WBC, UA 3-6  <3 WBC/hpf   RBC / HPF 0-2  <3 RBC/hpf   Bacteria, UA FEW (*) RARE   Casts GRANULAR CAST (*) NEGATIVE   Urine-Other AMORPHOUS URATES/PHOSPHATES    ABO/RH      Component Value Range   ABO/RH(D) A POS     A/P: Pt with hx anemia, lower GI bleeding, bilateral PE/DVT;plan is for placement of IVC filter today. Details/risks of procedure d/w pt/husband with their understanding and consent.

## 2012-05-18 NOTE — Consult Note (Signed)
Unassigned Consult  Reason for Consult:Hematochezia and anemia Referring Physician: [facility]  Donna Christen HPI: 56 year old female admitted with new RLE edema, hypotension, and anemia.  The patient was noted to have an HGB of 6.9.  On August 19th she underwent back surgery at [facility] and subsequent care at [facility].  She developed a DVT post surgery and she was treated with coumadin and lovenox.  Per her report she has remained therapeutic, but she was identified to have a new DVT.  Currently she is in the IR to have an IVC filter placed.  No history of coagulopathy in the family or any personal history.  She reports having issues with constipation with the narcotic medications for her back pain.  No prior history of constipation and two weeks ago she did have a very hard bowel movement.  Overall her bowel movements have been hard, but no report of proctalgia with passage of stools.  Hematochezia occurs intermittently with bowel movements.  No prior history of a colonoscopy.  She also reports having problems with GERD since her surgery.   Past Medical History  Diagnosis Date  . DVT (deep venous thrombosis)   . Pulmonary embolism   . Back pain     Past Surgical History  Procedure Date  . Back surgery     Family History  Problem Relation Age of Onset  . Leukemia Mother   . Prostate cancer Father     Social History:  reports that she has been smoking.  She does not have any smokeless tobacco history on file. She reports that she drinks alcohol. She reports that she does not use illicit drugs.  Allergies:  Allergies  Allergen Reactions  . Prednisone Other (See Comments)    No energy "stalls out"  . Tramadol Other (See Comments)    constipation    Medications:  Scheduled:   . sodium chloride   Intravenous Once  . pantoprazole (PROTONIX) IV  40 mg Intravenous Q12H  . sodium chloride  1,000 mL Intravenous Once   Continuous:   . sodium chloride 125 mL/hr at  05/18/12 1500    Results for orders placed during the hospital encounter of 05/18/12 (from the past 24 hour(s))  CBC WITH DIFFERENTIAL     Status: Abnormal   Collection Time   05/18/12 11:13 AM      Component Value Range   WBC 21.4 (*) 4.0 - 10.5 K/uL   RBC 2.53 (*) 3.87 - 5.11 MIL/uL   Hemoglobin 6.9 (*) 12.0 - 15.0 g/dL   HCT 24.4 (*) 01.0 - 27.2 %   MCV 82.6  78.0 - 100.0 fL   MCH 27.3  26.0 - 34.0 pg   MCHC 33.0  30.0 - 36.0 g/dL   RDW 53.6 (*) 64.4 - 03.4 %   Platelets 599 (*) 150 - 400 K/uL   Neutrophils Relative 88 (*) 43 - 77 %   Neutro Abs 18.8 (*) 1.7 - 7.7 K/uL   Lymphocytes Relative 6 (*) 12 - 46 %   Lymphs Abs 1.2  0.7 - 4.0 K/uL   Monocytes Relative 7  3 - 12 %   Monocytes Absolute 1.4 (*) 0.1 - 1.0 K/uL   Eosinophils Relative 0  0 - 5 %   Eosinophils Absolute 0.0  0.0 - 0.7 K/uL   Basophils Relative 0  0 - 1 %   Basophils Absolute 0.0  0.0 - 0.1 K/uL  COMPREHENSIVE METABOLIC PANEL     Status:  Abnormal   Collection Time   05/18/12 11:13 AM      Component Value Range   Sodium 131 (*) 135 - 145 mEq/L   Potassium 3.5  3.5 - 5.1 mEq/L   Chloride 89 (*) 96 - 112 mEq/L   CO2 28  19 - 32 mEq/L   Glucose, Bld 116 (*) 70 - 99 mg/dL   BUN 11  6 - 23 mg/dL   Creatinine, Ser 1.61  0.50 - 1.10 mg/dL   Calcium 8.9  8.4 - 09.6 mg/dL   Total Protein 6.8  6.0 - 8.3 g/dL   Albumin 1.9 (*) 3.5 - 5.2 g/dL   AST 14  0 - 37 U/L   ALT 9  0 - 35 U/L   Alkaline Phosphatase 135 (*) 39 - 117 U/L   Total Bilirubin 0.5  0.3 - 1.2 mg/dL   GFR calc non Af Amer >90  >90 mL/min   GFR calc Af Amer >90  >90 mL/min  PROTIME-INR     Status: Abnormal   Collection Time   05/18/12 11:13 AM      Component Value Range   Prothrombin Time 24.2 (*) 11.6 - 15.2 seconds   INR 2.29 (*) 0.00 - 1.49  POCT PREGNANCY, URINE     Status: Abnormal   Collection Time   05/18/12 12:57 PM      Component Value Range   Preg Test, Ur POSITIVE (*) NEGATIVE  TYPE AND SCREEN     Status: Normal (Preliminary  result)   Collection Time   05/18/12  1:00 PM      Component Value Range   ABO/RH(D) A POS     Antibody Screen NEG     Sample Expiration 05/21/2012     Unit Number [id number]     Blood Component Type RED CELLS,LR     Unit division 00     Status of Unit ALLOCATED     Transfusion Status OK TO TRANSFUSE     Crossmatch Result Compatible     Unit Number [id number]     Blood Component Type RED CELLS,LR     Unit division 00     Status of Unit ALLOCATED     Transfusion Status OK TO TRANSFUSE     Crossmatch Result Compatible    PREPARE RBC (CROSSMATCH)     Status: Normal   Collection Time   05/18/12  1:00 PM      Component Value Range   Order Confirmation ORDER PROCESSED BY BLOOD BANK    ABO/RH     Status: Normal   Collection Time   05/18/12  1:00 PM      Component Value Range   ABO/RH(D) A POS    OCCULT BLOOD, POC DEVICE     Status: Normal   Collection Time   05/18/12  1:03 PM      Component Value Range   Fecal Occult Bld POSITIVE    URINALYSIS, ROUTINE W REFLEX MICROSCOPIC     Status: Abnormal   Collection Time   05/18/12  1:09 PM      Component Value Range   Color, Urine YELLOW  YELLOW   APPearance TURBID (*) CLEAR   Specific Gravity, Urine 1.016  1.005 - 1.030   pH 8.0  5.0 - 8.0   Glucose, UA NEGATIVE  NEGATIVE mg/dL   Hgb urine dipstick SMALL (*) NEGATIVE   Bilirubin Urine NEGATIVE  NEGATIVE   Ketones, ur 15 (*) NEGATIVE mg/dL   Protein, ur 30 (*)  NEGATIVE mg/dL   Urobilinogen, UA 1.0  0.0 - 1.0 mg/dL   Nitrite NEGATIVE  NEGATIVE   Leukocytes, UA SMALL (*) NEGATIVE  URINE MICROSCOPIC-ADD ON     Status: Abnormal   Collection Time   05/18/12  1:09 PM      Component Value Range   Squamous Epithelial / LPF RARE  RARE   WBC, UA 3-6  <3 WBC/hpf   RBC / HPF 0-2  <3 RBC/hpf   Bacteria, UA FEW (*) RARE   Casts GRANULAR CAST (*) NEGATIVE   Urine-Other AMORPHOUS URATES/PHOSPHATES    LACTIC ACID, PLASMA     Status: Normal   Collection Time   05/18/12  2:51 PM       Component Value Range   Lactic Acid, Venous 0.9  0.5 - 2.2 mmol/L     Dg Abd Acute W/chest  05/18/2012  *RADIOLOGY REPORT*  Clinical Data: Sepsis and abdominal pain.  ACUTE ABDOMEN SERIES (ABDOMEN 2 VIEW & CHEST 1 VIEW)  Comparison: None  Findings: The cardiomediastinal silhouette is unremarkable. The lungs are clear. There is no of airspace disease, pleural effusion or pneumothorax.  Moderate stool in the ascending colon noted. Gas is present in the remainder of the colon and rectum. A few nondistended gas-filled loops small bowel within the left abdomen are identified. There is no evidence of bowel obstruction or pneumoperitoneum. Postoperative changes of the lower lumbar spine are present.  IMPRESSION: Nonspecific nonobstructive bowel gas pattern - no evidence of pneumoperitoneum.  No evidence of active cardiopulmonary disease.   Original Report Authenticated By: Rosendo Gros, M.D.     ROS:  As stated above in the HPI otherwise negative.  Blood pressure 124/54, pulse 112, temperature 98.9 F (37.2 C), temperature source Oral, resp. rate 14, height 5\' 5"  (1.651 m), weight 72.1 kg (158 lb 15.2 oz), SpO2 100.00%.    PE: Gen: NAD, Alert and Oriented HEENT:  [place]/AT, EOMI Neck: Supple, no LAD Lungs: CTA Bilaterally CV: RRR without M/G/R ABM: Soft, NTND, +BS Ext: Bilateral lower extremity edema Rectal: Unable to perform  - No privacy.  Assessment/Plan: 1) Hematochezia. 2) Anemia. 3) Constipation. 4) Coagulopathy.   Further evaluation with an EGD/Colonoscopy is required.  I am uncertain if her bleeding is secondary to hemorrhoidal irritation from the constipation potentiated by the coumadin.  She is hemodynamically stable at this time.  An upper source is still a possibility with her new onset GERD symptoms.  Plan: 1) EGD/Colonoscopy tomorrow.  Lorin Gawron D 05/18/2012, 5:31 PM

## 2012-05-18 NOTE — ED Notes (Signed)
Patient aware urine is needed for urinalysis

## 2012-05-18 NOTE — ED Provider Notes (Signed)
History     CSN: [id number]  Arrival date & time 05/18/12  1025   First MD Initiated Contact with Patient 05/18/12 1033      Chief Complaint  Patient presents with  . DVT    (Consider location/radiation/quality/duration/timing/severity/associated sxs/prior treatment) HPI Comments: [name] [name] is a 56 y.o. Female who presents with complaint of dizziness, weakness, right leg pain and swelling onset about 2 days ago. Pt states she has a known left leg DVT and was diagnosed with a PE last month. States on coumadin. States all complications with clots started after her back surgery in august. Pt states left leg swelling and pain improving. States she is not having any chest pain or shortness of breath. States she is dizzy and weak when walking and standing up. States last INR checked 2 wks ago at which time it was therapeutic. Pt states also has had loss of appetite recently. States nausea, vomited once in the last several days, thinking that was due from taking pain medications on empty stomach. PT states all she had yesterday was two ensures, and she just does not feel like eat.    Past Medical History  Diagnosis Date  . DVT (deep venous thrombosis)   . Pulmonary embolism   . Back pain     Past Surgical History  Procedure Date  . Back surgery     Family History  Problem Relation Age of Onset  . Leukemia Mother   . Prostate cancer Father     History  Substance Use Topics  . Smoking status: Current Every Day Smoker  . Smokeless tobacco: Not on file  . Alcohol Use: Yes     occ    OB History    Grav Para Term Preterm Abortions TAB SAB Ect Mult Living                  Review of Systems  Constitutional: Positive for appetite change and fatigue. Negative for fever and chills.  HENT: Negative for neck pain and neck stiffness.   Eyes: Negative for visual disturbance.  Respiratory: Negative for cough, choking, chest tightness and shortness of breath.   Cardiovascular:  Positive for leg swelling. Negative for chest pain and palpitations.  Gastrointestinal: Positive for nausea, vomiting and constipation. Negative for abdominal pain.  Genitourinary: Negative for dysuria.  Musculoskeletal: Positive for myalgias. Negative for back pain.  Skin: Negative.   Neurological: Positive for dizziness, weakness and light-headedness.  Hematological: Negative.     Allergies  Prednisone and Tramadol  Home Medications   Current Outpatient Rx  Name Route Sig Dispense Refill  . BISACODYL 5 MG PO TBEC Oral Take 15 mg by mouth daily as needed. constipation    . LISINOPRIL-HYDROCHLOROTHIAZIDE 20-12.5 MG PO TABS Oral Take 0.5 tablets by mouth daily as needed. If blood pressure greater than 135/90    . OXYCODONE-ACETAMINOPHEN 5-325 MG PO TABS Oral Take 2 tablets by mouth every 6 (six) hours as needed. pain    . SENNA-DOCUSATE SODIUM 8.6-50 MG PO TABS Oral Take 2 tablets by mouth every evening.    . WARFARIN SODIUM 2 MG PO TABS Oral Take 2 mg by mouth daily.      BP 115/60  Pulse 110  Temp 98.8 F (37.1 C) (Oral)  Resp 18  Ht 5\' 4"  (1.626 m)  Wt 145 lb (65.772 kg)  BMI 24.89 kg/m2  SpO2 100%  Physical Exam  Nursing note and vitals reviewed. Constitutional: She appears well-developed and well-nourished. No distress.  HENT:  Head: Normocephalic and atraumatic.  Right Ear: External ear normal.  Left Ear: External ear normal.  Mouth/Throat: Oropharynx is clear and moist.  Eyes: Conjunctivae normal are normal.  Neck: Neck supple.  Cardiovascular: Regular rhythm and normal heart sounds.  Exam reveals no gallop and no friction rub.   No murmur heard.      tachycardic  Pulmonary/Chest: Effort normal and breath sounds normal. No respiratory distress. She has no wheezes. She has no rales.  Abdominal: Soft. Bowel sounds are normal. She exhibits no distension. There is no tenderness. There is no rebound.    ED Course  Procedures (including critical care time)  Pt  with known PE, left lower extremity DVT, here with weakness, dizziness, right LE swelling. Will get labs, INR, right lower extremity venous dopler.   Results for orders placed during the hospital encounter of 05/18/12  CBC WITH DIFFERENTIAL      Component Value Range   WBC 21.4 (*) 4.0 - 10.5 K/uL   RBC 2.53 (*) 3.87 - 5.11 MIL/uL   Hemoglobin 6.9 (*) 12.0 - 15.0 g/dL   HCT 08.6 (*) 57.8 - 46.9 %   MCV 82.6  78.0 - 100.0 fL   MCH 27.3  26.0 - 34.0 pg   MCHC 33.0  30.0 - 36.0 g/dL   RDW 62.9 (*) 52.8 - 41.3 %   Platelets 599 (*) 150 - 400 K/uL   Neutrophils Relative 88 (*) 43 - 77 %   Neutro Abs 18.8 (*) 1.7 - 7.7 K/uL   Lymphocytes Relative 6 (*) 12 - 46 %   Lymphs Abs 1.2  0.7 - 4.0 K/uL   Monocytes Relative 7  3 - 12 %   Monocytes Absolute 1.4 (*) 0.1 - 1.0 K/uL   Eosinophils Relative 0  0 - 5 %   Eosinophils Absolute 0.0  0.0 - 0.7 K/uL   Basophils Relative 0  0 - 1 %   Basophils Absolute 0.0  0.0 - 0.1 K/uL  COMPREHENSIVE METABOLIC PANEL      Component Value Range   Sodium 131 (*) 135 - 145 mEq/L   Potassium 3.5  3.5 - 5.1 mEq/L   Chloride 89 (*) 96 - 112 mEq/L   CO2 28  19 - 32 mEq/L   Glucose, Bld 116 (*) 70 - 99 mg/dL   BUN 11  6 - 23 mg/dL   Creatinine, Ser 2.44  0.50 - 1.10 mg/dL   Calcium 8.9  8.4 - 01.0 mg/dL   Total Protein 6.8  6.0 - 8.3 g/dL   Albumin 1.9 (*) 3.5 - 5.2 g/dL   AST 14  0 - 37 U/L   ALT 9  0 - 35 U/L   Alkaline Phosphatase 135 (*) 39 - 117 U/L   Total Bilirubin 0.5  0.3 - 1.2 mg/dL   GFR calc non Af Amer >90  >90 mL/min   GFR calc Af Amer >90  >90 mL/min  PROTIME-INR      Component Value Range   Prothrombin Time 24.2 (*) 11.6 - 15.2 seconds   INR 2.29 (*) 0.00 - 1.49  POCT PREGNANCY, URINE      Component Value Range   Preg Test, Ur POSITIVE (*) NEGATIVE  OCCULT BLOOD, POC DEVICE      Component Value Range   Fecal Occult Bld POSITIVE     1:42 PM NOTE, PT IS NOT PREGNANT, MISTAKE BY LAB ENTERING RESULT IN.   Pt is positive for acute  right  leg DVT. Pt denies SOB or chest pain. Pt's coumadin is theraputic. Hemoccult is positive, with brown stool with red blood streaks, possible hemorrhoids but cannot be sure. Pt states she is feeling well just laying flat. No dizziness. Will continue fluids, blood ordered. Will get admitted for further work. Up  Spoke with [facility], will admit to step down.     1. DVT of lower limb, acute   2. Anemia   3. Weakness   4. Leukocytosis   5. Acute blood loss anemia   6. ARF (acute renal failure)   7. Dehydration with hyponatremia   8. DVT of leg (deep venous thrombosis)   9. History of back surgery   10. HTN (hypertension)   11. Hypokalemia   12. Lower GI bleed   13. Pulmonary embolism, bilateral   14. Sepsis   15. Sinus tachycardia   16. Hypercoagulation syndrome       MDM          Lottie Mussel, PA 05/20/12 [id number]

## 2012-05-18 NOTE — Consult Note (Signed)
[name] [name] [id number] 1956-04-27 56 y.o. 05/18/2012 8:45 PM   Referring Physician: [facility]  Reason For Consult:  Thrombosis  CC: Dr. Rudi Heap  Findings: This is a 56 year old woman [name] [name] we will presents to the emergency room with an acute onset of right lower extremity swelling with pain. The patient had undergone low-fat spinal fusion surgery at [facility] on 04/05/2012, stay she did well after surgery however 2 weeks of procedure she noted left lower lip pain with swelling. She is feeling [name] [name] and an ultrasound of lower leg noted deep vein thrombosis. CT angiogram on 04/20/2012 was positive for acute pulmonary embolism bilaterally. There is no other evidence of disease. The patient was placed on Lovenox and subsequently patients with Coumadin. She was discharged home on 04/23/2012. Her INRs were closely monitored out her primary care physician's office. On admission her INR is therapeutic at 2.9. She was also found to be guaiac positive. She was hypotensive with hemoglobin of 6.5. Her white count is elevated at 21,000. She denied a history for fever chills or night sweats. She denied chest pain or lightheadedness. She denied skin necrosis/rash.  An IVC filter was placed this evening, and Dr. Grace Isaac at the time noted extensive near occlusive thrombus extending through the IVC with nonvisualization of the left renal vein.     Past Medical History:  Past Medical History  Diagnosis Date  . DVT (deep venous thrombosis)   . Pulmonary embolism   . Back pain     Past Surgical History  Procedure Date  . Back surgery     Allergies:  Allergies  Allergen Reactions  . Prednisone Other (See Comments)    No energy "stalls out"  . Tramadol Other (See Comments)    constipation    Medications:  Prior to Admission:  Prescriptions prior to admission  Medication Sig Dispense Refill  . bisacodyl (DULCOLAX) 5 MG EC tablet Take 15 mg by mouth daily as  needed. constipation      . lisinopril-hydrochlorothiazide (PRINZIDE,ZESTORETIC) 20-12.5 MG per tablet Take 0.5 tablets by mouth daily as needed. If blood pressure greater than 135/90      . oxyCODONE-acetaminophen (PERCOCET/ROXICET) 5-325 MG per tablet Take 2 tablets by mouth every 6 (six) hours as needed. pain      . sennosides-docusate sodium (SENOKOT-S) 8.6-50 MG tablet Take 2 tablets by mouth every evening.      . warfarin (COUMADIN) 2 MG tablet Take 2 mg by mouth daily.       Scheduled:   . sodium chloride   Intravenous Once  . ceFEPime (MAXIPIME) IV  1 g Intravenous Q8H  . fentaNYL      . fentaNYL      . iohexol  20 mL Oral Q1 Hr x 2  . midazolam      . midazolam      . pantoprazole (PROTONIX) IV  40 mg Intravenous Q12H  . polyethylene glycol-electrolytes  4,000 mL Oral Once  . sodium chloride  1,000 mL Intravenous Once   Continuous:   . sodium chloride 125 mL/hr at 05/18/12 1500  . sodium chloride    . heparin     WUJ:[id number], acetaminophen, albuterol, alum & mag hydroxide-simeth, fentaNYL, HYDROcodone-acetaminophen, HYDROmorphone (DILAUDID) injection, iohexol, midazolam, ondansetron (ZOFRAN) IV, ondansetron  Social History:   reports that she has been smoking.  She does not have any smokeless tobacco history on file. She reports that she drinks alcohol. She reports that she does not use illicit drugs.  Family History:  Family History  Problem Relation Age of Onset  . Leukemia Mother   . Prostate cancer Father      Review of Systems: Constitutional ROS: Fever, Chills, Night Sweats, Anorexia, Pain  Cardiovascular ROS: no chest pain or dyspnea on exertion Respiratory ROS: no cough, shortness of breath, or wheezing Neurological ROS: no TIA or stroke symptoms Dermatological ROS: negative for rash and skin lesion changes ENT ROS: negative Gastrointestinal ROS: positive for - Hematochezia Genito-Urinary ROS: no dysuria, trouble voiding, or  hematuria Hematological and Lymphatic ROS: negative Breast ROS: negative Musculoskeletal ROS: negative Remaining ROS negative.   Physical Exam: Blood pressure 133/49, pulse 122, temperature 99.5 F (37.5 C), temperature source Oral, resp. rate 24, height 5\' 5"  (1.651 m), weight 158 lb 15.2 oz (72.1 kg), SpO2 97.00%.  General appearance: alert, cooperative and appears stated age Resp: clear to auscultation bilaterally Cardio: regular rate and rhythm, S1, S2 normal, no murmur, click, rub or gallop GI: soft, non-tender; bowel sounds normal; no masses,  no organomegaly Extremities: extremities normal, atraumatic, no cyanosis or edema   Lab Results: LABS:  CBC    Component Value Date/Time   WBC 21.4* 05/18/2012 1113   RBC 2.53* 05/18/2012 1113   HGB 6.5* 05/18/2012 1708   HCT 20.2* 05/18/2012 1708   PLT 599* 05/18/2012 1113   MCV 82.6 05/18/2012 1113   MCH 27.3 05/18/2012 1113   MCHC 33.0 05/18/2012 1113   RDW 16.0* 05/18/2012 1113   LYMPHSABS 1.2 05/18/2012 1113   MONOABS 1.4* 05/18/2012 1113   EOSABS 0.0 05/18/2012 1113   BASOSABS 0.0 05/18/2012 1113     Basename 05/18/12 1113  NA 131*  K 3.5  CL 89*  CO2 28  GLUCOSE 116*  BUN 11  CREATININE 0.68  CALCIUM 8.9     Radiological Studies: Ir Ivc Filter Plmt / S&i /img Guid/mod Sed  05/18/2012  *RADIOLOGY REPORT*  Indication:  History of pulmonary embolism, now with bilateral lower extremity DVT and lower GI bleed.  ULTRASOUND GUIDANCE FOR VASCULAR ACCESS IVC CATHETERIZATION AND VENOGRAM IVC FILTER INSERTION  Comparison: Chest CT - 04/20/2012; left lower extremity venous Doppler ultrasound - 05/12/2012; right lower extremity venous Doppler ultrasound - earlier same day  Medications: Fentanyl 150 mcg IV; Versed 1.5 mg IV  Contrast: 100 mL Omnipaque-300  Sedation time: 30 minutes  Fluoroscopy time: 3.9 minutes  Complications: None immediate  PROCEDURE/FINDINGS:  Informed consent was obtained from the patient following explanation of the  procedure, risks (including non-retrieval and caval thrombosis), benefits and alternatives.  The patient understands, agrees and consents for the procedure.  All questions were addressed.  A time out was performed prior to the initiation of the procedure.  Maximal barrier sterile technique utilized including caps, mask, sterile gowns, sterile gloves, large sterile drape, hand hygiene, and Betadine prep.  Under sterile condition and local anesthesia, right internal jugular venous access was performed with ultrasound.  An ultrasound image was saved and sent to PACS.  Over a guide wire, the IVC filter delivery sheath and inner dilator were advanced into the superior aspect of the IVC.  As there was difficulty advancing the guidewire more caudally, limited venogram was performed demonstrating near complete thrombosis of the peri-renal IVC.  With use of a regular Glidewire, the sheath was advanced more caudally with a repeat venograms demonstrate near complete thrombosis of the infra-renal IVC extending into the bilateral common iliac veins.  The introducer sheath was retracted and repeat venogram was performed delineating the location  of the right renal vein, however there was no definite opacification of the left renal vein.  As such, the decision was made to place a suprarenal IVC filter.  Through the delivery sheath, a retrievable Celect IVC filter was deployed above the level of the right renal vein, below the caval atrial junction.  Limited post deployment venogram confirmed appropriate positioning.  The delivery sheath was removed and hemostasis was obtained with manual compression.  A dressing was placed.  The patient tolerated the procedure well without immediate post procedural complication.  IMPRESSION:  1.  Successful ultrasound and fluoroscopic guided placement of a suprarenal retrievable IVC filter.  2.  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  Above findings discussed with Dr. Isidoro Donning at [phone].   Original Report Authenticated By: Waynard Reeds, M.D.    Dg Abd Acute W/chest  05/18/2012  *RADIOLOGY REPORT*  Clinical Data: Sepsis and abdominal pain.  ACUTE ABDOMEN SERIES (ABDOMEN 2 VIEW & CHEST 1 VIEW)  Comparison: None  Findings: The cardiomediastinal silhouette is unremarkable. The lungs are clear. There is no of airspace disease, pleural effusion or pneumothorax.  Moderate stool in the ascending colon noted. Gas is present in the remainder of the colon and rectum. A few nondistended gas-filled loops small bowel within the left abdomen are identified. There is no evidence of bowel obstruction or pneumoperitoneum. Postoperative changes of the lower lumbar spine are present.  IMPRESSION: Nonspecific nonobstructive bowel gas pattern - no evidence of pneumoperitoneum.  No evidence of active cardiopulmonary disease.   Original Report Authenticated By: Rosendo Gros, M.D.      Impression and Plan: 1. Hypercoagulable state with extensive thrombosis involving both lower extremity, vena cava and patient also has history of pulmonary embolus despite been therapeutic on Coumadin. Patient does not have a family history or personal history.  Consider protein C deficiency. She does not have evidence of skin necrosis. Hypercoagulable panel will be limited as she was on Coumadin. She has evidence of rectal bleeding with anemia but I would recommend beginning heparin ( without bolus) as is very hypercoagulable.  Signs of bleeding can be monitored closely.  I agree with CT scan of abdomen and pelvis to rule out malignancy.  2. Anemia - transfuse packed RBCs.  3. Leukocytosis with hypotension - obtain blood cultures x2 sites and to begin broad spectrum antibiotics with Maxipime  Discussed treatment plan with patient, primary care team and CCU  Thank you for this referral.  We will follow closely with you.   Arlan Organ I.,  MD 05/18/2012

## 2012-05-18 NOTE — ED Notes (Signed)
Patient transported to Ultrasound 

## 2012-05-18 NOTE — Plan of Care (Signed)
Problem: Consults Goal: Diagnosis - Venous Thromboembolism (VTE) Choose a selection Outcome: Completed/Met Date Met:  05/18/12 DVT (Deep Vein Thrombosis)

## 2012-05-18 NOTE — Care Management Note (Signed)
    Page 1 of 1   05/28/2012     2:00:20 PM   CARE MANAGEMENT NOTE 05/28/2012  Patient:  [name] [name], [name] [name]   Account Number:  [id number]  Date Initiated:  05/18/2012  Documentation initiated by:  Junius Creamer  Subjective/Objective Assessment:   adm w sepsis     Action/Plan:   lives w husband, pcp dr Roe Coombs Christell Constant   Anticipated DC Date:  05/28/2012   Anticipated DC Plan:  IP REHAB FACILITY      DC Planning Services  CM consult      Choice offered to / List presented to:             Status of service:  Completed, signed off Medicare Important Message given?   (If response is "NO", the following Medicare IM given date fields will be blank) Date Medicare IM given:   Date Additional Medicare IM given:    Discharge Disposition:  IP REHAB FACILITY  Per UR Regulation:  Reviewed for med. necessity/level of care/duration of stay  If discussed at Long Length of Stay Meetings, dates discussed:   05/25/2012    Comments:  05/27/12 Arielle Eber,RN,BSN [phone] PT STABLE MEDICALLY FOR DISCHARGE TO [facility] TODAY.  05/26/12 Chantele Corado,RN,BSN [phone] PT STILL WITH SWOLLEN LEGS AND FEET, LIMITING MOBILITY. CIR C/S IN PROGRESS.  WILL FOLLOW.  10/7 10:15a debbie dowell rn,bsn cont to follow. remains on tinkase and heparin iv.  10/1 15:44p debbie dowell rn,bsn [phone]

## 2012-05-18 NOTE — Progress Notes (Addendum)
CONSULT NOTE - Initial Consult  Pharmacy Consult for heparin, cefepime Indication: DVT, SIRS/leukocytosis   Allergies  Allergen Reactions  . Prednisone Other (See Comments)    No energy "stalls out"  . Tramadol Other (See Comments)    constipation    Patient Measurements: Height: 5\' 5"  (165.1 cm) Weight: 158 lb 15.2 oz (72.1 kg) IBW/kg (Calculated) : 57    Vital Signs: Temp: 99.5 F (37.5 C) (10/01 1959) Temp src: Oral (10/01 1959) BP: 133/49 mmHg (10/01 1945) Pulse Rate: 122  (10/01 1945)  Labs:  Basename 05/18/12 1708 05/18/12 1113  HGB 6.5* 6.9*  HCT 20.2* 20.9*  PLT -- 599*  APTT -- --  LABPROT -- 24.2*  INR -- 2.29*  HEPARINUNFRC -- --  CREATININE -- 0.68  CKTOTAL -- --  CKMB -- --  TROPONINI -- --    Estimated Creatinine Clearance: 78.1 ml/min (by C-G formula based on Cr of 0.68).   Medical History: Past Medical History  Diagnosis Date  . DVT (deep venous thrombosis)   . Pulmonary embolism   . Back pain     Medications:  Prescriptions prior to admission  Medication Sig Dispense Refill  . bisacodyl (DULCOLAX) 5 MG EC tablet Take 15 mg by mouth daily as needed. constipation      . lisinopril-hydrochlorothiazide (PRINZIDE,ZESTORETIC) 20-12.5 MG per tablet Take 0.5 tablets by mouth daily as needed. If blood pressure greater than 135/90      . oxyCODONE-acetaminophen (PERCOCET/ROXICET) 5-325 MG per tablet Take 2 tablets by mouth every 6 (six) hours as needed. pain      . sennosides-docusate sodium (SENOKOT-S) 8.6-50 MG tablet Take 2 tablets by mouth every evening.      . warfarin (COUMADIN) 2 MG tablet Take 2 mg by mouth daily.        Assessment: 56 yo female with a DVT/PE in the first week of September now here with a new right DVT (despite therapeutic anticoagulation) s/p IVC placement on 05/18/12. Hg/hct noted at 6.5/20.2 and noted with intermittent hematochezia for EGD/colonoscopy. Patient also to start cefepime for possble infection.   Goal of  Therapy:  Heparin level 0.3-0.7 units/ml Monitor platelets by anticoagulation protocol: Yes   Plan:  -No heparin bolus per MD -Will start heparin at 700 units/hr (~10 units/kg/hr) -Heparin level in 6 hours and daily wth CBC daily -Cefepime 1gm q8h -Will follow cultures and renal function  Harland German, Pharm D 05/18/2012 8:48 PM

## 2012-05-18 NOTE — Progress Notes (Signed)
*  Preliminary Results* Right lower extremity venous duplex completed. Right lower extremity is positive for deep vein thrombosis involving the right saphenofemoral junction, common femoral, femoral, popliteal and posterior tibial veins.  Preliminary results discussed with RN Wes and PA Tatyana.  05/18/2012 12:48 PM Gertie Fey, RDMS, RDCS

## 2012-05-19 ENCOUNTER — Encounter (HOSPITAL_COMMUNITY)
Admission: EM | Disposition: A | Discharge status: Rehabilitation Facility | Payer: Self-pay | Source: Home / Self Care | Attending: Internal Medicine

## 2012-05-19 ENCOUNTER — Encounter (HOSPITAL_COMMUNITY): Payer: Self-pay | Admitting: Gastroenterology

## 2012-05-19 DIAGNOSIS — M79609 Pain in unspecified limb: Secondary | ICD-10-CM

## 2012-05-19 DIAGNOSIS — E611 Iron deficiency: Secondary | ICD-10-CM | POA: Diagnosis present

## 2012-05-19 DIAGNOSIS — M7989 Other specified soft tissue disorders: Secondary | ICD-10-CM

## 2012-05-19 DIAGNOSIS — E871 Hypo-osmolality and hyponatremia: Secondary | ICD-10-CM | POA: Diagnosis present

## 2012-05-19 DIAGNOSIS — D473 Essential (hemorrhagic) thrombocythemia: Secondary | ICD-10-CM | POA: Diagnosis present

## 2012-05-19 DIAGNOSIS — K5909 Other constipation: Secondary | ICD-10-CM | POA: Diagnosis present

## 2012-05-19 DIAGNOSIS — I8010 Phlebitis and thrombophlebitis of unspecified femoral vein: Secondary | ICD-10-CM

## 2012-05-19 DIAGNOSIS — D6859 Other primary thrombophilia: Secondary | ICD-10-CM | POA: Diagnosis present

## 2012-05-19 DIAGNOSIS — D75839 Thrombocytosis, unspecified: Secondary | ICD-10-CM | POA: Diagnosis present

## 2012-05-19 HISTORY — PX: COLONOSCOPY: SHX5424

## 2012-05-19 HISTORY — PX: ESOPHAGOGASTRODUODENOSCOPY: SHX5428

## 2012-05-19 LAB — BETA-2-GLYCOPROTEIN I ABS, IGG/M/A
Beta-2 Glyco I IgG: 0 G Units (ref <20)
Beta-2-Glycoprotein I IgA: 5 A Units (ref <20)
Beta-2-Glycoprotein I IgM: 3 M Units (ref <20)

## 2012-05-19 LAB — FOLATE
Folate: 4.3 ng/mL
Folate: 3.9 ng/mL

## 2012-05-19 LAB — CBC
HCT: 26.4 % — ABNORMAL LOW (ref 36.0–46.0)
Hemoglobin: 8.9 g/dL — ABNORMAL LOW (ref 12.0–15.0)
MCH: 27.8 pg (ref 26.0–34.0)
MCHC: 33.7 g/dL (ref 30.0–36.0)
MCV: 82.5 fL (ref 78.0–100.0)
Platelets: 449 10*3/uL — ABNORMAL HIGH (ref 150–400)
RBC: 3.2 MIL/uL — ABNORMAL LOW (ref 3.87–5.11)
RDW: 15.7 % — ABNORMAL HIGH (ref 11.5–15.5)
WBC: 18.9 10*3/uL — ABNORMAL HIGH (ref 4.0–10.5)

## 2012-05-19 LAB — VITAMIN B12
Vitamin B-12: 285 pg/mL (ref 211–911)
Vitamin B-12: 247 pg/mL (ref 211–911)

## 2012-05-19 LAB — IRON AND TIBC
Iron: <10 ug/dL — ABNORMAL LOW (ref 42–135)
Iron: <10 ug/dL — ABNORMAL LOW (ref 42–135)
UIBC: 137 ug/dL (ref 125–400)
UIBC: 113 ug/dL — ABNORMAL LOW (ref 125–400)

## 2012-05-19 LAB — CARDIOLIPIN ANTIBODIES, IGG, IGM, IGA
Anticardiolipin IgA: 8 APL U/mL — ABNORMAL LOW (ref <22)
Anticardiolipin IgG: 12 GPL U/mL (ref <23)
Anticardiolipin IgM: 0 MPL U/mL — ABNORMAL LOW (ref <11)

## 2012-05-19 LAB — URINE CULTURE
Colony Count: NO GROWTH
Culture: NO GROWTH

## 2012-05-19 LAB — BASIC METABOLIC PANEL
BUN: 7 mg/dL (ref 6–23)
CO2: 24 mEq/L (ref 19–32)
Calcium: 8.4 mg/dL (ref 8.4–10.5)
Chloride: 96 mEq/L (ref 96–112)
Creatinine, Ser: 0.46 mg/dL — ABNORMAL LOW (ref 0.50–1.10)
GFR calc Af Amer: >90 mL/min (ref >90)
GFR calc non Af Amer: >90 mL/min (ref >90)
Glucose, Bld: 99 mg/dL (ref 70–99)
Potassium: 3.2 mEq/L — ABNORMAL LOW (ref 3.5–5.1)
Sodium: 133 mEq/L — ABNORMAL LOW (ref 135–145)

## 2012-05-19 LAB — FERRITIN
Ferritin: 320 ng/mL — ABNORMAL HIGH (ref 10–291)
Ferritin: 332 ng/mL — ABNORMAL HIGH (ref 10–291)

## 2012-05-19 LAB — HEPARIN LEVEL (UNFRACTIONATED)
Heparin Unfractionated: <0.1 IU/mL — ABNORMAL LOW (ref 0.30–0.70)
Heparin Unfractionated: <0.1 IU/mL — ABNORMAL LOW (ref 0.30–0.70)

## 2012-05-19 LAB — HEMOGLOBIN AND HEMATOCRIT, BLOOD
HCT: 25.1 % — ABNORMAL LOW (ref 36.0–46.0)
Hemoglobin: 8.3 g/dL — ABNORMAL LOW (ref 12.0–15.0)

## 2012-05-19 LAB — HOMOCYSTEINE: Homocysteine: 6.2 umol/L (ref 4.0–15.4)

## 2012-05-19 SURGERY — COLONOSCOPY
Anesthesia: Moderate Sedation

## 2012-05-19 MED ORDER — FENTANYL CITRATE 0.05 MG/ML IJ SOLN
INTRAMUSCULAR | Status: AC
  Filled 2012-05-19: qty 2

## 2012-05-19 MED ORDER — MIDAZOLAM HCL 5 MG/ML IJ SOLN
INTRAMUSCULAR | Status: AC
  Filled 2012-05-19: qty 2

## 2012-05-19 MED ORDER — CYANOCOBALAMIN 1000 MCG/ML IJ SOLN
1000.0000 ug | Freq: Every day | INTRAMUSCULAR | Status: AC
  Administered 2012-05-20 – 2012-05-25 (×4): 1000 ug via SUBCUTANEOUS
  Filled 2012-05-19 (×7): qty 1

## 2012-05-19 MED ORDER — HEPARIN (PORCINE) IN NACL 100-0.45 UNIT/ML-% IJ SOLN
2000.0000 [IU]/h | INTRAMUSCULAR | Status: DC
  Administered 2012-05-19: 1400 [IU]/h via INTRAVENOUS
  Filled 2012-05-19 (×3): qty 250

## 2012-05-19 MED ORDER — IOHEXOL 300 MG/ML  SOLN
100.0000 mL | Freq: Once | INTRAMUSCULAR | Status: AC | PRN
  Administered 2012-05-19: 100 mL via INTRAVENOUS

## 2012-05-19 MED ORDER — POLYETHYLENE GLYCOL 3350 17 GM/SCOOP PO POWD
1.0000 | Freq: Once | ORAL | Status: AC
  Administered 2012-05-19: 1 via ORAL
  Filled 2012-05-19: qty 255

## 2012-05-19 MED ORDER — FENTANYL CITRATE 0.05 MG/ML IJ SOLN
INTRAMUSCULAR | Status: DC | PRN
  Administered 2012-05-19 (×4): 25 ug via INTRAVENOUS
  Administered 2012-05-19: 12.5 ug via INTRAVENOUS

## 2012-05-19 MED ORDER — MIDAZOLAM HCL 10 MG/2ML IJ SOLN
INTRAMUSCULAR | Status: DC | PRN
  Administered 2012-05-19 (×5): 2 mg via INTRAVENOUS

## 2012-05-19 MED ORDER — MIDAZOLAM HCL 5 MG/ML IJ SOLN
INTRAMUSCULAR | Status: AC
  Filled 2012-05-19: qty 1

## 2012-05-19 MED ORDER — DIPHENHYDRAMINE HCL 50 MG/ML IJ SOLN
INTRAMUSCULAR | Status: AC
  Filled 2012-05-19: qty 1

## 2012-05-19 NOTE — Progress Notes (Signed)
SUBJECTIVE: Feels a little better. Right lower leg stil tender.  Denies chest pain and shortness of breath.  Denies hemoptysis.   MEDICATIONS:  Scheduled:   . sodium chloride   Intravenous Once  . ceFEPime (MAXIPIME) IV  1 g Intravenous Q8H  . fentaNYL      . fentaNYL      . iohexol  20 mL Oral Q1 Hr x 2  . midazolam      . midazolam      . pantoprazole (PROTONIX) IV  40 mg Intravenous Q12H  . polyethylene glycol powder  1 Container Oral Once  . polyethylene glycol-electrolytes  4,000 mL Oral Once   Continuous:   . sodium chloride 125 mL/hr (05/19/12 0651)  . sodium chloride    . heparin 1,100 Units/hr (05/19/12 1130)  . DISCONTD: heparin 700 Units/hr (05/19/12 0700)   HQI:ONGEXBMWUXLKG, acetaminophen, albuterol, alum & mag hydroxide-simeth, fentaNYL, HYDROcodone-acetaminophen, HYDROmorphone (DILAUDID) injection, iohexol, iohexol, midazolam, ondansetron (ZOFRAN) IV, ondansetron   PHYSICAL EXAM  Vital signs in last 24 hours: Temp:  [97.8 F (36.6 C)-100.8 F (38.2 C)] 98 F (36.7 C) (10/02 1100) Pulse Rate:  [95-129] 105  (10/02 1100) Resp:  [12-28] 18  (10/02 1100) BP: (101-144)/(41-77) 132/61 mmHg (10/02 1100) SpO2:  [91 %-100 %] 97 % (10/02 1100) Weight:  [158 lb 15.2 oz (72.1 kg)-167 lb 8.8 oz (76 kg)] 167 lb 8.8 oz (76 kg) (10/02 0730)  Intake/Output from previous day: 10/01 0701 - 10/02 0700 In: 6171.8 [P.O.:3446; I.V.:1923.8; Blood:700; IV Piggyback:102] Out: 1685 [Urine:1385; Stool:300] Intake/Output this shift: Total I/O In: 1378 [P.O.:840; I.V.:528; IV Piggyback:10] Out: 2175 [Stool:2175]  General appearance: alert and cooperative Resp: clear to auscultation bilaterally and normal percussion bilaterally Cardio: regular rate and rhythm, S1, S2 normal, no murmur, click, rub or gallop GI: soft, non-tender; bowel sounds normal; no masses,  no organomegaly Extremities: edema bilaterally to thighs Skin - no petechiae   LABS:  CBC    Component Value  Date/Time   WBC 21.4* 05/18/2012 1113   RBC 2.53* 05/18/2012 1113   HGB 8.3* 05/19/2012 0824   HCT 25.1* 05/19/2012 0824   PLT 599* 05/18/2012 1113   MCV 82.6 05/18/2012 1113   MCH 27.3 05/18/2012 1113   MCHC 33.0 05/18/2012 1113   RDW 16.0* 05/18/2012 1113   LYMPHSABS 1.2 05/18/2012 1113   MONOABS 1.4* 05/18/2012 1113   EOSABS 0.0 05/18/2012 1113   BASOSABS 0.0 05/18/2012 1113     Basename 05/19/12 0824 05/18/12 1113  NA 133* 131*  K 3.2* 3.5  CL 96 89*  CO2 24 28  GLUCOSE 99 116*  BUN 7 11  CREATININE 0.46* 0.68  CALCIUM 8.4 8.9     XRAYS/RESULTS: Ct Abdomen Pelvis W Contrast  05/19/2012  **ADDENDUM** CREATED: 05/19/2012 01:02:48  I discussed these findings by telephone with Tama Gander, of the Hospitalist service at to 0103 hours on 05/19/2012.  **END ADDENDUM** SIGNED BY: Minerva Areola A. Molli Posey, M.D.   05/19/2012  *RADIOLOGY REPORT*  Clinical Data: Heme-positive stools.  IVC thrombus.  Status post suprarenal IVC filter placement.  CT ABDOMEN AND PELVIS WITH CONTRAST  Technique:  Multidetector CT imaging of the abdomen and pelvis was performed following the standard protocol during bolus administration of intravenous contrast.  Contrast: OMNIPAQUE IOHEXOL 300 MG/ML  SOLN  Comparison: None.  Findings: Diffuse fatty changes seen in the liver.  No focal abnormality is noted in the liver or spleen.  The stomach, duodenum, pancreas, gallbladder, adrenal glands, and kidneys have normal  imaging features.  No abdominal aortic aneurysm.  The suprarenal IVC filter is evident.  There is occlusive thrombus in the IVC, extending from the level of the left renal vein down both common iliac veins. Clot propagation into both internal iliac veins is also noted. There is no free fluid or lymphadenopathy in the abdomen.  Imaging through the pelvis shows bilateral pedicle screws at the L4 and L5 levels.  The pedicle screws at L5 projects through the anterior cortex of the L5 vertebral body.  The tip of the left  pedicle screw appears to involve the left common iliac vein.  There is retroperitoneal edema in the upper pelvis bilaterally which tracks into the pelvic sidewalls and pre for sacral space of the pelvis.  No substantial intraperitoneal free fluid in the pelvis.  Uterus is unremarkable.  No evidence for adnexal mass. The terminal ileum is normal.  The appendix is normal.  There is edema within the proximal right thigh and groin region.  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  There is retroperitoneal and pelvic sidewall edema bilaterally with right greater than left proximal thigh edema. The edema is presumably secondary to the venous thrombosis.  The patient is noted to have bilateral pedicle screws at L4 and L5 and the left L5 pedicle screw passes through the anterior L5 cortex with the tip of the screw this apparently involving the left common iliac vein.   Original Report Authenticated By: ERIC A. MANSELL, M.D.      ASSESSMENT and PLAN: 1.Hypercoagulable state with extensive thrombosis despite therapeutic INR - S/P IVC filter (retrievable). Needs anticoagulation, so would continue with IV heparin and monitor rectal bleeding.  R/O APLA or lupus anticoagulant clotting disorder Will await results of limited hypercoagulable panel. R/O occult malignancy and colonoscopy reasonable.  Obtain CEA level.  2. Anemia - s/p 2 units of PRBC   Renette Hsu I., MD 05/19/2012

## 2012-05-19 NOTE — Consult Note (Signed)
Reason for Consult: Deep venous thrombosis, question of hardware placement Referring Physician: [name] is an 56 y.o. female.  HPI: Patient is a 56 year old female who had surgery in August 19 for decompression at L4-L5 with posterior instrumentation. About 10 days after surgery the patient developed swelling in her left lower extremity and was found to have a deep venous thrombosis. She was started on anticoagulation. In the last day or 2 she is developed painful swelling in the right lower extremity and is found to have extensive deep venous thrombosis throughout her vascular system. A CT scan of the abdomen demonstrates the presence of some protruding hardware at L5 which nears the iliac veins. Question has been asked whether the hardware may be contributing to the formation of clots for this woman.  Past Medical History  Diagnosis Date  . DVT (deep venous thrombosis)   . Pulmonary embolism   . Back pain     Past Surgical History  Procedure Date  . Back surgery     Family History  Problem Relation Age of Onset  . Leukemia Mother   . Prostate cancer Father     Social History:  reports that she has been smoking.  She does not have any smokeless tobacco history on file. She reports that she drinks alcohol. She reports that she does not use illicit drugs.  Allergies:  Allergies  Allergen Reactions  . Prednisone Other (See Comments)    No energy "stalls out"  . Tramadol Other (See Comments)    constipation    Medications: I have reviewed the patient's current medications.  Results for orders placed during the hospital encounter of 05/18/12 (from the past 48 hour(s))  CBC WITH DIFFERENTIAL     Status: Abnormal   Collection Time   05/18/12 11:13 AM      Component Value Range Comment   WBC 21.4 (*) 4.0 - 10.5 K/uL    RBC 2.53 (*) 3.87 - 5.11 MIL/uL    Hemoglobin 6.9 (*) 12.0 - 15.0 g/dL    HCT 16.1 (*) 09.6 - 46.0 %    MCV 82.6  78.0 - 100.0 fL    MCH 27.3  26.0  - 34.0 pg    MCHC 33.0  30.0 - 36.0 g/dL    RDW 04.5 (*) 40.9 - 15.5 %    Platelets 599 (*) 150 - 400 K/uL    Neutrophils Relative 88 (*) 43 - 77 %    Neutro Abs 18.8 (*) 1.7 - 7.7 K/uL    Lymphocytes Relative 6 (*) 12 - 46 %    Lymphs Abs 1.2  0.7 - 4.0 K/uL    Monocytes Relative 7  3 - 12 %    Monocytes Absolute 1.4 (*) 0.1 - 1.0 K/uL    Eosinophils Relative 0  0 - 5 %    Eosinophils Absolute 0.0  0.0 - 0.7 K/uL    Basophils Relative 0  0 - 1 %    Basophils Absolute 0.0  0.0 - 0.1 K/uL   COMPREHENSIVE METABOLIC PANEL     Status: Abnormal   Collection Time   05/18/12 11:13 AM      Component Value Range Comment   Sodium 131 (*) 135 - 145 mEq/L    Potassium 3.5  3.5 - 5.1 mEq/L    Chloride 89 (*) 96 - 112 mEq/L    CO2 28  19 - 32 mEq/L    Glucose, Bld 116 (*) 70 - 99 mg/dL  BUN 11  6 - 23 mg/dL    Creatinine, Ser 1.47  0.50 - 1.10 mg/dL    Calcium 8.9  8.4 - 82.9 mg/dL    Total Protein 6.8  6.0 - 8.3 g/dL    Albumin 1.9 (*) 3.5 - 5.2 g/dL    AST 14  0 - 37 U/L    ALT 9  0 - 35 U/L    Alkaline Phosphatase 135 (*) 39 - 117 U/L    Total Bilirubin 0.5  0.3 - 1.2 mg/dL    GFR calc non Af Amer >90  >90 mL/min    GFR calc Af Amer >90  >90 mL/min   PROTIME-INR     Status: Abnormal   Collection Time   05/18/12 11:13 AM      Component Value Range Comment   Prothrombin Time 24.2 (*) 11.6 - 15.2 seconds    INR 2.29 (*) 0.00 - 1.49   POCT PREGNANCY, URINE     Status: Abnormal   Collection Time   05/18/12 12:57 PM      Component Value Range Comment   Preg Test, Ur POSITIVE (*) NEGATIVE   TYPE AND SCREEN     Status: Normal (Preliminary result)   Collection Time   05/18/12  1:00 PM      Component Value Range Comment   ABO/RH(D) A POS      Antibody Screen NEG      Sample Expiration 05/21/2012      Unit Number [id number]      Blood Component Type RED CELLS,LR      Unit division 00      Status of Unit ISSUED      Transfusion Status OK TO TRANSFUSE      Crossmatch Result  Compatible      Unit Number [id number]      Blood Component Type RED CELLS,LR      Unit division 00      Status of Unit ISSUED      Transfusion Status OK TO TRANSFUSE      Crossmatch Result Compatible     PREPARE RBC (CROSSMATCH)     Status: Normal   Collection Time   05/18/12  1:00 PM      Component Value Range Comment   Order Confirmation ORDER PROCESSED BY BLOOD BANK     ABO/RH     Status: Normal   Collection Time   05/18/12  1:00 PM      Component Value Range Comment   ABO/RH(D) A POS     OCCULT BLOOD, POC DEVICE     Status: Normal   Collection Time   05/18/12  1:03 PM      Component Value Range Comment   Fecal Occult Bld POSITIVE     URINALYSIS, ROUTINE W REFLEX MICROSCOPIC     Status: Abnormal   Collection Time   05/18/12  1:09 PM      Component Value Range Comment   Color, Urine YELLOW  YELLOW    APPearance TURBID (*) CLEAR    Specific Gravity, Urine 1.016  1.005 - 1.030    pH 8.0  5.0 - 8.0    Glucose, UA NEGATIVE  NEGATIVE mg/dL    Hgb urine dipstick SMALL (*) NEGATIVE    Bilirubin Urine NEGATIVE  NEGATIVE    Ketones, ur 15 (*) NEGATIVE mg/dL    Protein, ur 30 (*) NEGATIVE mg/dL    Urobilinogen, UA 1.0  0.0 - 1.0 mg/dL    Nitrite NEGATIVE  NEGATIVE    Leukocytes, UA SMALL (*) NEGATIVE   URINE MICROSCOPIC-ADD ON     Status: Abnormal   Collection Time   05/18/12  1:09 PM      Component Value Range Comment   Squamous Epithelial / LPF RARE  RARE    WBC, UA 3-6  <3 WBC/hpf    RBC / HPF 0-2  <3 RBC/hpf    Bacteria, UA FEW (*) RARE    Casts GRANULAR CAST (*) NEGATIVE    Urine-Other AMORPHOUS URATES/PHOSPHATES     LACTIC ACID, PLASMA     Status: Normal   Collection Time   05/18/12  2:51 PM      Component Value Range Comment   Lactic Acid, Venous 0.9  0.5 - 2.2 mmol/L   MRSA PCR SCREENING     Status: Normal   Collection Time   05/18/12  3:48 PM      Component Value Range Comment   MRSA by PCR NEGATIVE  NEGATIVE   HEMOGLOBIN AND HEMATOCRIT, BLOOD     Status:  Abnormal   Collection Time   05/18/12  5:08 PM      Component Value Range Comment   Hemoglobin 6.5 (*) 12.0 - 15.0 g/dL CRITICAL VALUE NOTED.  VALUE IS CONSISTENT WITH PREVIOUSLY REPORTED AND CALLED VALUE.   HCT 20.2 (*) 36.0 - 46.0 %   VITAMIN B12     Status: Normal   Collection Time   05/18/12  5:08 PM      Component Value Range Comment   Vitamin B-12 285  211 - 911 pg/mL   FOLATE     Status: Normal   Collection Time   05/18/12  5:08 PM      Component Value Range Comment   Folate 4.3     IRON AND TIBC     Status: Abnormal   Collection Time   05/18/12  5:08 PM      Component Value Range Comment   Iron <10 (*) 42 - 135 ug/dL    TIBC Not calculated due to Iron <10.  250 - 470 ug/dL    Saturation Ratios Not calculated due to Iron <10.  20 - 55 %    UIBC 137  125 - 400 ug/dL   FERRITIN     Status: Abnormal   Collection Time   05/18/12  5:08 PM      Component Value Range Comment   Ferritin 320 (*) 10 - 291 ng/mL   RETICULOCYTES     Status: Abnormal   Collection Time   05/18/12  5:08 PM      Component Value Range Comment   Retic Ct Pct 1.7  0.4 - 3.1 %    RBC. 2.44 (*) 3.87 - 5.11 MIL/uL    Retic Count, Manual 41.5  19.0 - 186.0 K/uL   RETICULOCYTES     Status: Abnormal   Collection Time   05/18/12  9:42 PM      Component Value Range Comment   Retic Ct Pct 1.4  0.4 - 3.1 %    RBC. 2.45 (*) 3.87 - 5.11 MIL/uL    Retic Count, Manual 34.3  19.0 - 186.0 K/uL   URINALYSIS, ROUTINE W REFLEX MICROSCOPIC     Status: Abnormal   Collection Time   05/18/12 11:24 PM      Component Value Range Comment   Color, Urine YELLOW  YELLOW    APPearance CLEAR  CLEAR    Specific Gravity, Urine 1.040 (*) 1.005 -  1.030    pH 6.5  5.0 - 8.0    Glucose, UA NEGATIVE  NEGATIVE mg/dL    Hgb urine dipstick MODERATE (*) NEGATIVE    Bilirubin Urine NEGATIVE  NEGATIVE    Ketones, ur 15 (*) NEGATIVE mg/dL    Protein, ur NEGATIVE  NEGATIVE mg/dL    Urobilinogen, UA 0.2  0.0 - 1.0 mg/dL    Nitrite NEGATIVE   NEGATIVE    Leukocytes, UA TRACE (*) NEGATIVE   URINE MICROSCOPIC-ADD ON     Status: Abnormal   Collection Time   05/18/12 11:24 PM      Component Value Range Comment   Squamous Epithelial / LPF FEW (*) RARE    WBC, UA 0-2  <3 WBC/hpf    RBC / HPF 3-6  <3 RBC/hpf    Bacteria, UA RARE  RARE     Ct Abdomen Pelvis W Contrast  05/19/2012  **ADDENDUM** CREATED: 05/19/2012 01:02:48  I discussed these findings by telephone with Tama Gander, of the Hospitalist service at to 0103 hours on 05/19/2012.  **END ADDENDUM** SIGNED BY: Minerva Areola A. Molli Posey, M.D.   05/19/2012  *RADIOLOGY REPORT*  Clinical Data: Heme-positive stools.  IVC thrombus.  Status post suprarenal IVC filter placement.  CT ABDOMEN AND PELVIS WITH CONTRAST  Technique:  Multidetector CT imaging of the abdomen and pelvis was performed following the standard protocol during bolus administration of intravenous contrast.  Contrast: OMNIPAQUE IOHEXOL 300 MG/ML  SOLN  Comparison: None.  Findings: Diffuse fatty changes seen in the liver.  No focal abnormality is noted in the liver or spleen.  The stomach, duodenum, pancreas, gallbladder, adrenal glands, and kidneys have normal imaging features.  No abdominal aortic aneurysm.  The suprarenal IVC filter is evident.  There is occlusive thrombus in the IVC, extending from the level of the left renal vein down both common iliac veins. Clot propagation into both internal iliac veins is also noted. There is no free fluid or lymphadenopathy in the abdomen.  Imaging through the pelvis shows bilateral pedicle screws at the L4 and L5 levels.  The pedicle screws at L5 projects through the anterior cortex of the L5 vertebral body.  The tip of the left pedicle screw appears to involve the left common iliac vein.  There is retroperitoneal edema in the upper pelvis bilaterally which tracks into the pelvic sidewalls and pre for sacral space of the pelvis.  No substantial intraperitoneal free fluid in the pelvis.   Uterus is unremarkable.  No evidence for adnexal mass. The terminal ileum is normal.  The appendix is normal.  There is edema within the proximal right thigh and groin region.  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  There is retroperitoneal and pelvic sidewall edema bilaterally with right greater than left proximal thigh edema. The edema is presumably secondary to the venous thrombosis.  The patient is noted to have bilateral pedicle screws at L4 and L5 and the left L5 pedicle screw passes through the anterior L5 cortex with the tip of the screw this apparently involving the left common iliac vein.   Original Report Authenticated By: ERIC A. MANSELL, M.D.    Ir Ivc Filter Plmt / S&i /img Guid/mod Sed  05/18/2012  *RADIOLOGY REPORT*  Indication:  History of pulmonary embolism, now with bilateral lower extremity DVT and lower GI bleed.  ULTRASOUND GUIDANCE FOR VASCULAR ACCESS IVC CATHETERIZATION AND VENOGRAM IVC FILTER  INSERTION  Comparison: Chest CT - 04/20/2012; left lower extremity venous Doppler ultrasound - 05/12/2012; right lower extremity venous Doppler ultrasound - earlier same day  Medications: Fentanyl 150 mcg IV; Versed 1.5 mg IV  Contrast: 100 mL Omnipaque-300  Sedation time: 30 minutes  Fluoroscopy time: 3.9 minutes  Complications: None immediate  PROCEDURE/FINDINGS:  Informed consent was obtained from the patient following explanation of the procedure, risks (including non-retrieval and caval thrombosis), benefits and alternatives.  The patient understands, agrees and consents for the procedure.  All questions were addressed.  A time out was performed prior to the initiation of the procedure.  Maximal barrier sterile technique utilized including caps, mask, sterile gowns, sterile gloves, large sterile drape, hand hygiene, and Betadine prep.  Under sterile condition and local anesthesia,  right internal jugular venous access was performed with ultrasound.  An ultrasound image was saved and sent to PACS.  Over a guide wire, the IVC filter delivery sheath and inner dilator were advanced into the superior aspect of the IVC.  As there was difficulty advancing the guidewire more caudally, limited venogram was performed demonstrating near complete thrombosis of the peri-renal IVC.  With use of a regular Glidewire, the sheath was advanced more caudally with a repeat venograms demonstrate near complete thrombosis of the infra-renal IVC extending into the bilateral common iliac veins.  The introducer sheath was retracted and repeat venogram was performed delineating the location of the right renal vein, however there was no definite opacification of the left renal vein.  As such, the decision was made to place a suprarenal IVC filter.  Through the delivery sheath, a retrievable Celect IVC filter was deployed above the level of the right renal vein, below the caval atrial junction.  Limited post deployment venogram confirmed appropriate positioning.  The delivery sheath was removed and hemostasis was obtained with manual compression.  A dressing was placed.  The patient tolerated the procedure well without immediate post procedural complication.  IMPRESSION:  1.  Successful ultrasound and fluoroscopic guided placement of a suprarenal retrievable IVC filter.  2.  Near complete thrombosis of the IVC to the level of the renal veins with no definite opacification of the left renal vein.  Above findings discussed with Dr. Isidoro Donning at [phone].   Original Report Authenticated By: Waynard Reeds, M.D.    Dg Abd Acute W/chest  05/18/2012  *RADIOLOGY REPORT*  Clinical Data: Sepsis and abdominal pain.  ACUTE ABDOMEN SERIES (ABDOMEN 2 VIEW & CHEST 1 VIEW)  Comparison: None  Findings: The cardiomediastinal silhouette is unremarkable. The lungs are clear. There is no of airspace disease, pleural effusion or pneumothorax.   Moderate stool in the ascending colon noted. Gas is present in the remainder of the colon and rectum. A few nondistended gas-filled loops small bowel within the left abdomen are identified. There is no evidence of bowel obstruction or pneumoperitoneum. Postoperative changes of the lower lumbar spine are present.  IMPRESSION: Nonspecific nonobstructive bowel gas pattern - no evidence of pneumoperitoneum.  No evidence of active cardiopulmonary disease.   Original Report Authenticated By: Rosendo Gros, M.D.     Review of Systems  Constitutional: Positive for malaise/fatigue.  HENT: Negative.   Eyes: Negative.   Respiratory: Negative.   Cardiovascular: Negative.   Gastrointestinal: Negative.   Genitourinary: Negative.   Musculoskeletal: Positive for back pain.       Bilateral lower extremity edema  Skin: Negative.   Neurological: Positive for weakness.  Endo/Heme/Allergies:  Deep venous thrombosis and pulmonary embolus status post surgery  Psychiatric/Behavioral: Negative.    Blood pressure 104/77, pulse 95, temperature 97.8 F (36.6 C), temperature source Oral, resp. rate 18, height 5\' 5"  (1.651 m), weight 76 kg (167 lb 8.8 oz), SpO2 98.00%. Physical Exam  Constitutional: She is oriented to person, place, and time.  HENT:  Head: Normocephalic and atraumatic.  Eyes: Conjunctivae normal and EOM are normal. Pupils are equal, round, and reactive to light.  Neck: Normal range of motion. Neck supple.  Respiratory: Effort normal and breath sounds normal.  GI: Soft. Bowel sounds are normal.  Neurological: She is alert and oriented to person, place, and time. She has normal reflexes.  Skin: Skin is warm and dry.  Psychiatric: She has a normal mood and affect. Her behavior is normal. Judgment and thought content normal.    Assessment/Plan: I do not believe that the hardware is causing a problem with the formation of clots. I believe that the patient likely has some underlying  hypercoagulability is contributing to this process however can't be hard put to suggest that removing or replacing the hardware is likely to achieve this phenomenon. Further medical workup is now being undertaken. I certainly defer my opinion to Dr. Janee Morn we see who originally did her surgery however I have seen hardware placed in this position on numerous occasions without ill effect towards the vascular system.  Kyriaki Moder J 05/19/2012, 9:48 AM

## 2012-05-19 NOTE — Progress Notes (Signed)
CONSULT NOTE - Initial Consult  Pharmacy Consult for heparin Indication: DVT  Allergies  Allergen Reactions  . Prednisone Other (See Comments)    No energy "stalls out"  . Tramadol Other (See Comments)    constipation    Patient Measurements: Height: 5\' 5"  (165.1 cm) Weight: 167 lb 8.8 oz (76 kg) IBW/kg (Calculated) : 57    Vital Signs: Temp: 99 F (37.2 C) (10/02 1604) Temp src: Oral (10/02 1545) BP: 135/66 mmHg (10/02 1800) Pulse Rate: 120  (10/02 1545)  Labs:  Basename 05/19/12 1837 05/19/12 1836 05/19/12 0824 05/18/12 1708 05/18/12 1113  HGB -- 8.9* 8.3* -- --  HCT -- 26.4* 25.1* 20.2* --  PLT -- 449* -- -- 599*  APTT -- -- -- -- --  LABPROT -- -- -- -- 24.2*  INR -- -- -- -- 2.29*  HEPARINUNFRC <0.10* -- <0.10* -- --  CREATININE -- -- 0.46* -- 0.68  CKTOTAL -- -- -- -- --  CKMB -- -- -- -- --  TROPONINI -- -- -- -- --    Estimated Creatinine Clearance: 80.1 ml/min (by C-G formula based on Cr of 0.46).   Medical History: Past Medical History  Diagnosis Date  . DVT (deep venous thrombosis)   . Pulmonary embolism   . Back pain     Medications:  Prescriptions prior to admission  Medication Sig Dispense Refill  . bisacodyl (DULCOLAX) 5 MG EC tablet Take 15 mg by mouth daily as needed. constipation      . lisinopril-hydrochlorothiazide (PRINZIDE,ZESTORETIC) 20-12.5 MG per tablet Take 0.5 tablets by mouth daily as needed. If blood pressure greater than 135/90      . oxyCODONE-acetaminophen (PERCOCET/ROXICET) 5-325 MG per tablet Take 2 tablets by mouth every 6 (six) hours as needed. pain      . sennosides-docusate sodium (SENOKOT-S) 8.6-50 MG tablet Take 2 tablets by mouth every evening.      . warfarin (COUMADIN) 2 MG tablet Take 2 mg by mouth daily.        Assessment: 56 yo female with a DVT/PE in the first week of September now here with a new right DVT (despite therapeutic anticoagulation) s/p IVC placement on 05/18/12. Recurrent DVT despite  therapeutic INR. Heparin came back subtherapeutic < 0.1 this PM. EGD/Colonoscopy this only afternoon, only significant for anal fissure. No active bleeding currently. Hgb has been stable after PRBC. Will try to be a bit more aggressive despite her anemia issue since she has active clots. No interruption with infusion, no bleeding noted per RN   Goal of Therapy:  Heparin level 0.3-0.7 units/ml Monitor platelets by anticoagulation protocol: Yes   Plan:   Increase heparin to 1400 units/hr Check 6h heparin level at 0200

## 2012-05-19 NOTE — Consult Note (Signed)
PULMONARY/CCM CONSULT NOTE  Requesting MD/Service: [facility] Date of admission:  10/01 Date of consult: 10/01 Reason for consultation: recent DVT/PE. Acute LGIB  Pt Profile:  56 year old female with spinal fusion surgery in [facility] on August 19th, subsequently had left leg DVT with bilateral pulmonary embolism in first week of September and was discharged on Lovenox and Coumadin on 04/23/2012. Admitted 10/01 to [facility] service with hematochezia, light headedness, new onset RLE edema, anemia (Hgb 6.9) and mild hypotension. PCCM asked to assist in eval and mgmt. Her original DVT was in LLE. Venous duplex study on day of this admission revealed new extensive DVT in RLE. She indicates that she has been therapeutic or supra-therapeutic on all pro-time checks since discharge from hospital 9/6  HPI: as above. She has had no respiratory symptoms, specifically no CP, pleurodynia, DOE, hemoptysis or purulent sputum. She is alredy scheduled for IVC filter placement. GI consultation has been requested    Past Medical History  Diagnosis Date  . DVT (deep venous thrombosis)   . Pulmonary embolism   . Back pain     MEDICATIONS:   History   Social History  . Marital Status: Married    Spouse Name: N/A    Number of Children: N/A  . Years of Education: N/A   Occupational History  . Not on file.   Social History Main Topics  . Smoking status: Current Every Day Smoker  . Smokeless tobacco: Not on file  . Alcohol Use: Yes     occ  . Drug Use: No  . Sexually Active:    Other Topics Concern  . Not on file   Social History Narrative  . No narrative on file    Family History  Problem Relation Age of Onset  . Leukemia Mother   . Prostate cancer Father     ROS - Denies unexplained wt loss. No breast lumps. ROS entirely negative except as per HPI  Filed Vitals:   05/19/12 0704 05/19/12 0730 05/19/12 0900 05/19/12 1100  BP: 111/64 108/71 104/77 132/61  Pulse: 97 95    Temp: 97.8 F  (36.6 C) 98.7 F (37.1 C)    TempSrc: Oral Oral    Resp: 16 18 20 18   Height:      Weight:  76 kg (167 lb 8.8 oz)    SpO2: 99% 98% 97% 97%    EXAM:  Gen: NAD HEENT: WNL Neck: No JVD Lungs: clear to ausc and perc Cardiovascular: tachy, regular, no murmurs Abdomen: soft, NT, NABS Musculoskeletal: warm, 2+ pitting edema symmetrically Neuro: intact   DATA:   BMET    Component Value Date/Time   NA 133* 05/19/2012 0824   K 3.2* 05/19/2012 0824   CL 96 05/19/2012 0824   CO2 24 05/19/2012 0824   GLUCOSE 99 05/19/2012 0824   BUN 7 05/19/2012 0824   CREATININE 0.46* 05/19/2012 0824   CALCIUM 8.4 05/19/2012 0824   GFRNONAA >90 05/19/2012 0824   GFRAA >90 05/19/2012 0824    CBC    Component Value Date/Time   WBC 21.4* 05/18/2012 1113   RBC 2.53* 05/18/2012 1113   HGB 8.3* 05/19/2012 0824   HCT 25.1* 05/19/2012 0824   PLT 599* 05/18/2012 1113   MCV 82.6 05/18/2012 1113   MCH 27.3 05/18/2012 1113   MCHC 33.0 05/18/2012 1113   RDW 16.0* 05/18/2012 1113   LYMPHSABS 1.2 05/18/2012 1113   MONOABS 1.4* 05/18/2012 1113   EOSABS 0.0 05/18/2012 1113   BASOSABS 0.0 05/18/2012 1113  CXR: NACPD EKG: ST, IRBBB, no acute ischemia, no S1Q3T3 RLE venous duplex (10/01): preliminary report > extensive RLE DVT  IMPRESSION:   1) Recent DVT/PE after back surgery 2) New RLE DVT despite therapeutic anticoagulation 3) Acute blood loss anemia with BRBPR  DISCUSSION: This is a very challenging case. The most troubling aspect of it is that she has had new and extensive DVT develop despite what sounds like therapeutic anticoagulation. Although she has never had previous thrombotic events, one would have to be concerned for a hypercoagulable state. These would include the usual inborn thrombophilic states as well as occult malignancies. She has never had a colonoscopy or mammography. Ultimately, even with IVC filter placement, it would be desirable to resume anticoagulation as soon as can be safely done to prevent  long term sequelae of bilateral DVTs (post-phlebitic syndrome) specially given that the IVC filter will be a temporary measure.  PLAN/REC:  1) Agree with IVC filter placement, retrievable if possible. 2) As she does not seem to be briskly bleeding, will not give FFP or vitamin K until we have a better idea of what the LGIB process is. 3) A hypercoagulation panel should be sent once she is no longer affected by warfarin and acute thrombosis and H/O consult is certainly appreciated. 4) GI consult noted. 5) She should undergo mammography and full colonoscopy as the recommended screening procedures for occult malignancy as a cause for her to suddenly started forming now.  Will continue to follow with you.  Alyson Reedy, M.D. [facility] Pulmonary/Critical Care Medicine. Pager: [phone]. After hours pager: [phone].

## 2012-05-19 NOTE — Progress Notes (Signed)
CONSULT NOTE - Initial Consult  Pharmacy Consult for heparin Indication: DVT  Allergies  Allergen Reactions  . Prednisone Other (See Comments)    No energy "stalls out"  . Tramadol Other (See Comments)    constipation    Patient Measurements: Height: 5\' 5"  (165.1 cm) Weight: 167 lb 8.8 oz (76 kg) IBW/kg (Calculated) : 57    Vital Signs: Temp: 98.7 F (37.1 C) (10/02 0730) Temp src: Oral (10/02 0730) BP: 132/61 mmHg (10/02 1100) Pulse Rate: 95  (10/02 0730)  Labs:  Basename 05/19/12 0824 05/18/12 1708 05/18/12 1113  HGB 8.3* 6.5* --  HCT 25.1* 20.2* 20.9*  PLT -- -- 599*  APTT -- -- --  LABPROT -- -- 24.2*  INR -- -- 2.29*  HEPARINUNFRC <0.10* -- --  CREATININE 0.46* -- 0.68  CKTOTAL -- -- --  CKMB -- -- --  TROPONINI -- -- --    Estimated Creatinine Clearance: 80.1 ml/min (by C-G formula based on Cr of 0.46).   Medical History: Past Medical History  Diagnosis Date  . DVT (deep venous thrombosis)   . Pulmonary embolism   . Back pain     Medications:  Prescriptions prior to admission  Medication Sig Dispense Refill  . bisacodyl (DULCOLAX) 5 MG EC tablet Take 15 mg by mouth daily as needed. constipation      . lisinopril-hydrochlorothiazide (PRINZIDE,ZESTORETIC) 20-12.5 MG per tablet Take 0.5 tablets by mouth daily as needed. If blood pressure greater than 135/90      . oxyCODONE-acetaminophen (PERCOCET/ROXICET) 5-325 MG per tablet Take 2 tablets by mouth every 6 (six) hours as needed. pain      . sennosides-docusate sodium (SENOKOT-S) 8.6-50 MG tablet Take 2 tablets by mouth every evening.      . warfarin (COUMADIN) 2 MG tablet Take 2 mg by mouth daily.        Assessment: 56 yo female with a DVT/PE in the first week of September now here with a new right DVT (despite therapeutic anticoagulation) s/p IVC placement on 05/18/12. Recurrent DVT despite therapeutic INR. Heparin came back subtherapeutic this AM. S/p PRBC. Will try to be a bit more aggressive  despite her anemia issue since she has active clots.    Goal of Therapy:  Heparin level 0.3-0.7 units/ml Monitor platelets by anticoagulation protocol: Yes   Plan:   Increase heparin to 1100 units/hr Check 6h heparin level

## 2012-05-19 NOTE — Progress Notes (Signed)
Event: 2100: I was asked by Dr Dalene Carrow to reassess pt. She voiced concern that pt has not been started on antibiotics for her significant leukocytosis and feels pt has significant risk of decline during the night. Subjective: 2130: Spoke w/ pt at length regarding her recent hx w/ regard to recent back surgery and events that have taken place since. Pt currently states she is having minimal to mod amt of pain to her RLE but otherwise is comfortable as her IV dilaudid seems to be managing her pain well. Denies CP, SOB, nausea or other sx's at this time. Objective: Pt noted lying in bed in no acute distress. Pt is very pleasant, smiling at intervals. Current VS, BP-125/52, P-122, R-24 w/ 02 sats of 96% on room air. BBS CTA, HR 122 w/ RRR and w/o M/G/R. PPP strong and equal bil x 4 extremities. Non-pitting edema to RLE w/o erythema or palpable cords. Ct abd/pelvis w/ cm pending. Pt receiving heparin at 700u/hr. Dr Lowry Ram ordered blood cx's x 2 and request Maxipime be started after cx's drawn. Pt currently receiving PRBC's per admission plan. Assessment/Plan: 1.. Hypercoagulable state with extensive thrombosis involving both lower extremity w/ recent hx bil PE despite being therapeutic on Coumadin. Successful placement of IVC catheter today. Heparin infusion in progress per admission plan. 2. Anemia - transfusion packed RBCs in progress per admission plan. 3. Leukocytosis with hypotension - obtain blood cultures x2 sites and begin broad spectrum antibiotics (Maxipime) per Dr Lonell Face plan. Pt appears to be stable at this time but certainly bears very close monitoring in SDU (per admission plan) Reassessment: 0105: Spoke w/ Dr Molli Posey in radiology re: significant findings of Ct abd/pelvic w/cm. Discussed results w/ Dr Toniann Fail. As per our discussion vascular surgeon Dr Myra Gianotti was consulted and has agreed to see pt this am. He did not feel any emergent intervention was indicated and agreed w/ current plan of  care.  He did recommend neurosurgery be consulted as well. Dr [name] Dess consulted and feels pt is receiving appropriate care at this time and does not feel he could add anything more at this time. He does recommend that the neurosurgeon at [facility] (Dr Burtis Junes) be called in AM and told about pt's complication to allow him to guide care as well. I have also discussed findings w/ pt who appears to understand findings and consultations.    Leanne Chang, NP-C [facility] Pager [phone]

## 2012-05-19 NOTE — Op Note (Signed)
Eligha Bridegroom [facility] [address] [place], [address]   OPERATIVE PROCEDURE REPORT  PATIENT: [name] [name], [name] [name]  MR#: [id number] BIRTHDATE: October 12, 1955  GENDER: Female ENDOSCOPIST: Jeani Hawking, MD ASSISTANT:   Karie Soda, Technician, Janae Sauce, RN, and Beryle Beams, Technician PROCEDURE DATE: 05/19/2012 PROCEDURE:   EGD, diagnostic ASA CLASS:   Class III INDICATIONS:anemia. MEDICATIONS: See the colonoscopy report. TOPICAL ANESTHETIC:   Cetacaine Spray  DESCRIPTION OF PROCEDURE:   After the risks benefits and alternatives of the procedure were thoroughly explained, informed consent was obtained.  The     endoscope was introduced through the mouth  and advanced to the second portion of the duodenum Without limitations.      The instrument was slowly withdrawn as the mucosa was fully examined.    FINDINGS: The upper, middle and distal third of the esophagus were carefully inspected and no abnormalities were noted.  The z-line was well seen at the GEJ.  The endoscope was pushed into the fundus which was normal including a retroflexed view.  The antrum, gastric body, first and second part of the duodenum were unremarkable. Retroflexed views revealed no abnormalities.     The scope was then withdrawn from the patient and the procedure terminated.  COMPLICATIONS: There were no complications.  IMPRESSION:Normal EGD  RECOMMENDATIONS:Proceed with the colonoscopy.   _______________________________ eSignedJeani Hawking, MD 05/19/2012 5:35 PM

## 2012-05-19 NOTE — Op Note (Signed)
Eligha Bridegroom [facility] [address] [place], [address]   OPERATIVE PROCEDURE REPORT  PATIENT: [name] [name], [name] [name]  MR#: [id number] BIRTHDATE: [birth year]  GENDER: Female ENDOSCOPIST: Jeani Hawking, MD ASSISTANT:   Karie Soda, Technician Janie Billups, Technician Janae Sauce, RN PROCEDURE DATE: 05/19/2012 PROCEDURE:   Colonoscopy, diagnostic ASA CLASS:   Class III INDICATIONS:hematochezia. MEDICATIONS: Fentanyl 112.5 mcg IV, Versed 10 mg IV, and Benadryl 50 mg IV  DESCRIPTION OF PROCEDURE:   After the risks benefits and alternatives of the procedure were thoroughly explained, informed consent was obtained.  A digital rectal exam revealed an anal fissure.    The EC-[id number] ([id number])  endoscope was introduced through the anus  and advanced to the cecum, which was identified by both the appendix and ileocecal valve , No adverse events experienced.    The quality of the prep was good. .  The instrument was then slowly withdrawn as the colon was fully examined.     COLON FINDINGS: The colon required washing and in two locations stool was encountered, but they were able to be moved with the water jet and the biopsy forceps.  The underlying mucosa was able to be evaluated.  There was no evidence of any inflammation, ulcerations, erosions, polyps, masses, or vascular abnormalities. Retroflexed views revealed internal/external hemorrhoids.     The scope was then withdrawn from the patient and the procedure terminated.  I repeated the rectal examination and she has a rather deep anal fissure, which I suspect is the source of the hematochezia.  There was no evidence of blood in the colon.  COMPLICATIONS: There were no complications.  IMPRESSION:1) Anterior anal fissure.  RECOMMENDATIONS:1) Hypercoagulable work up per Heme/Onc.   _______________________________ Rosalie Doctor:  Jeani Hawking, MD 05/19/2012 5:32 PM

## 2012-05-19 NOTE — Interval H&P Note (Signed)
History and Physical Interval Note:  05/19/2012 4:30 PM  Merelyn Klump  has presented today for surgery, with the diagnosis of Hematochezia  The various methods of treatment have been discussed with the patient and family. After consideration of risks, benefits and other options for treatment, the patient has consented to  Procedure(s) (LRB) with comments: COLONOSCOPY (N/A) ESOPHAGOGASTRODUODENOSCOPY (EGD) (N/A) as a surgical intervention .  The patient's history has been reviewed, patient examined, no change in status, stable for surgery.  I have reviewed the patient's chart and labs.  Questions were answered to the patient's satisfaction.     Sherrise Liberto D

## 2012-05-19 NOTE — Consult Note (Signed)
[facility] [place]      Consult Note  Patient name: [name] [name] MRN: [id number] DOB: 01/22/1956 Sex: female  Consulting Physician:  Hospitalist  Reason for Consult:  Chief Complaint  Patient presents with  . DVT    HISTORY OF PRESENT ILLNESS: 56 year old female who is s/p posterior approach for L4-L5 decompression.  This was done in [place] approximately 1 month ago.  Post operatively she developed a left leg DVT and PE.  She was started on coumadin.  She waas recently diagnosed with a right leg DVT while on coumadin as well as another PE.  A supra-renal IVC filter was placed due to the clot extending up to her left renal vein.  A CT scan reveals significant thrombus burden to the IVC, Iliac and femoral veins bilaterally.  The CT snows that the screws at L-5 are outside of the anterior cortex, and it appears that the left is in very close proximity to the left iliac vein.  She hasprofound bilateral leg swelling.  She has also had bright red blood per rectum.  Past Medical History  Diagnosis Date  . DVT (deep venous thrombosis)   . Pulmonary embolism   . Back pain     Past Surgical History  Procedure Date  . Back surgery     History   Social History  . Marital Status: Married    Spouse Name: N/A    Number of Children: N/A  . Years of Education: N/A   Occupational History  . Not on file.   Social History Main Topics  . Smoking status: Current Every Day Smoker  . Smokeless tobacco: Not on file  . Alcohol Use: Yes     occ  . Drug Use: No  . Sexually Active:    Other Topics Concern  . Not on file   Social History Narrative  . No narrative on file    Family History  Problem Relation Age of Onset  . Leukemia Mother   . Prostate cancer Father     Allergies as of 05/18/2012 - Review Complete 05/18/2012  Allergen Reaction Noted  . Prednisone Other (See Comments) 04/20/2012  . Tramadol Other (See Comments) 04/20/2012    No current  facility-administered medications on file prior to encounter.   Current Outpatient Prescriptions on File Prior to Encounter  Medication Sig Dispense Refill  . lisinopril-hydrochlorothiazide (PRINZIDE,ZESTORETIC) 20-12.5 MG per tablet Take 0.5 tablets by mouth daily as needed. If blood pressure greater than 135/90      . sennosides-docusate sodium (SENOKOT-S) 8.6-50 MG tablet Take 2 tablets by mouth every evening.      . warfarin (COUMADIN) 2 MG tablet Take 2 mg by mouth daily.         REVIEW OF SYSTEMS: Cardiovascular: No chest pain, chest pressure, palpitations, orthopnea, or dyspnea on exertion. No claudication or rest pain,  Pulmonary: No productive cough, asthma or wheezing. Neurologic: No weakness, paresthesias, aphasia, or amaurosis. No dizziness. Hematologic: No bleeding problems or clotting disorders. Musculoskeletal: No joint pain or joint swelling.  Positive back pain, improved after surgery Gastrointestinal: positive bright red blood per rectum Genitourinary: No dysuria or hematuria. Psychiatric:: No history of major depression. Integumentary: No rashes or ulcers. Constitutional: No fever or chills.  PHYSICAL EXAMINATION: General: The patient appears their stated age.  Vital signs are BP 135/66  Pulse 120  Temp 99 F (37.2 C) (Oral)  Resp 17  Ht 5\' 5"  (1.651 m)  Wt 167 lb 8.8  oz (76 kg)  BMI 27.88 kg/m2  SpO2 98% Pulmonary: Respirations are non-labored HEENT:  No gross abnormalities Abdomen: Soft and non-tender  Musculoskeletal: There are no major deformities.   Neurologic: No focal weakness or paresthesias are detected, Skin: There are no ulcer or rashes noted. Psychiatric: The patient has normal affect. Cardiovascular: There is a regular rate and rhythm without significant murmur appreciated.  Palpable pedal pulses.  Significant bilateral lower extremity edema.  Diagnostic Studies: I have reviewed her CT scan and venogram which reveal significant clot  burden   Assessment:  Bilateral DVT and IVC thrombus extending to the level of the renal vein Plan: I would first notify her surgeon of her admission and allow him the chance to treat her.  Dr. [name] Dess will also see her and comment on whether or not she needs any additional back surgery to remove the screw.  I agree with a hypercoaguable work up.  With her clot burden, she is at very high risk for post-phlebitic syndrome.  Therefore, I would recommend thrombolysis or at least mechanical thrombectomy to decrease the thrombus burden to lower her risk of post phlebitic syndrome.  I would ask radiology to do this.  I would be reluctant to attempt an operative thrombectomy due to the friable tissue secondary from her surgery.  This places her at high risk for intra-operative bleeding complications     V. Charlena Cross, M.D. [facility] [place] Office: [phone] Pager:  [phone]

## 2012-05-19 NOTE — Progress Notes (Signed)
[facility] Progress Note [place] TEAM 1 - Stepdown/ICU TEAM   [name] [name] MRN:[id number] DOB: 11/12/1955 DOA: 05/18/2012 PCP: MOORE, DONALD, MD  Brief narrative: 56-year-old female with recent history of spinal surgery at [facility] in August. She was hospitalized at [place] for left lower extremity DVT with associated bilateral pulmonary embolism. She subsequently was discharged on Lovenox and Coumadin. Patient had been taking her medication as prescribed and had been following with her outpatient physician. Two weeks ago her INR was 2.4. Three days prior to admission patient began having nausea and vomiting and was unable to keep any food down or fluids. She also began noticing bright red blood per rectum which she supposed was related to her history of hemorrhoids. She was also experiencing significant constipation. Two days prior to admission she began having pain in her right leg with associated swelling. She was also having dizziness and lightheadedness and near blackout spells with significant weakness. Because of these symptoms she presented to the emergency department. Evaluation there demonstrated a low hemoglobin of 6.9 and heme positive stool. She was also hypotensive and orthostatic and had associated tachycardia. She was also found to have a thrombocytosis with a platelet count of 599,000. She doesn't have leukocytosis with a white count greater than 20,000. During previous admission the patient presented with a hemoglobin of 6.9 and a platelet count of 592,000. On date of discharge 04/23/2012 her hemoglobin was 7.4 and her platelets were 670,000. Because of concerns with recurrent DVT and likely PE patient was admitted to the step down unit for further monitoring and treatment. It was important to note that her INR was therapeutic at presentation to the ER at 2.29 and this is quite concerning for an underlying hypercoagulable disorder.  Assessment/Plan:  Acute blood loss  anemia *Review of the laboratory data reveals that when patient presented initially in September hemoglobin was around 12 and at time of discharge hemoglobin had decreased to 7.4 *Patient has currently received 2 units of packed red blood cells this admission and repeat hemoglobin today is up to 8.3 *Continue to follow CBC every 12 hours  Lower GI bleed *Appreciate gastroenterology assistance *For EGD and colonoscopy today *Patient endorsed some red bleeding with bowel prep overnight but no abdominal pain *Patient also endorsed a history of GERD to the gastroenterologist  Iron deficiency *Likely related to chronic ongoing blood loss exacerbated by recent issues of poor nutrition. Patient and husband endorses patient has really not eaten much for at least 3 weeks *Iron was less than 10 on anemia panel this admission. May need to repeat after receipt of packed red blood cells and it remains low consider IV iron repletion  Pulmonary embolism, bilateral- moderate clot burden *This is recurrent and in the setting of therapeutic INR on Coumadin *Currently her pulmonary status is stable and she is on room air *She is also hemodynamically stable central indications at this time to pursue echocardiogram  Hypercoagulation syndrome/thrombocytosis *Very concerning since has redeveloped DVT and PE in setting of therapeutic INR on Coumadin *Because of recurrent PE an IVC filter was recommended and during insertion of this filter it was noted that this patient had extensive near occlusive thrombus extending to the IVC with nonvisualization of the left renal vein. *Appreciate hematology assistance. Full workup in process including hypercoagulable panel. *CT of the abdomen and pelvis shows no gross signs of malignancy. CEA level pending, in addition rule out for APLA or lupus anticoagulant pending  DVT, recurrent, lower extremity, acute- history   of LLE DVT 04/2012 *Still having discomfort in right lower  extremity *Workup and treatment as outlined above  Tobacco abuse *Counseled regarding cessation - an absolute must  HTN (hypertension) *Blood pressure well controlled *Home ACE inhibitor with thiazide diuretic currently on hold  History of back surgery/leukocytosis *Has been evaluated by neurosurgery this admission. Dr. Elsner has also reviewed the CT scan and discussed results with Dr. Linton Stolp. He mentioned abnormality regarding one of the screws in relationship to left common iliac vein is not an uncommon finding and Dr. Elsner does not feel that this has contributed to the patient's development of DVT *Dr. Thompson at [facility] performed the initial surgery in August 2013 *Patient with leukocytosis but no fever which is likely reactive and related to extensive thrombosis. As a precaution the patient was started on broad-spectrum antibiotics. No definitive infection or abscess seen on CT abdomen and pelvis  Hyponatremia/hypokalemia *Suspect at this point primarily related to bowel prep so we'll follow electrolyte panel and when can take oral we'll replete with combination of IV and oral medication  Constipation, chronic *Likely exacerbated by recent narcotic usage and suspect has contributed to patient's GI symptomatology in regards to nausea and vomiting *Currently should be adequately treated after bowel prep but will need to begin preventative measures once diet resumed  B12 deficiency  Begin SQ load - will need to be followed in outpt setting  DVT prophylaxis: Had IVC filter placed this admission. Currently on full dose heparin Code Status: Full Family Communication: Spoke directly with patient and her husband at the bedside Disposition Plan: Remain in step  Consultants: Hematology Pulmonology Gastroenterology Interventional radiology  Procedures: Placement of supra-renal IVC filter on 05/18/2012 interventional radiology EGD and colonoscopy by Dr.  hung pending on 05/19/2012  Antibiotics: Maxipime 10/1 >>>  HPI/Subjective: Patient alert and despite everything she's been through is quite animated and has a good sense of humor. She continues to complain of right lower extremity pain. Denies shortness of breath or chest pain. Still remains weak. Concerned about issues with her constipation.   Objective: Blood pressure 132/61, pulse 105, temperature 98 F (36.7 C), temperature source Oral, resp. rate 18, height 5' 5" (1.651 m), weight 76 kg (167 lb 8.8 oz), SpO2 97.00%.  Intake/Output Summary (Last 24 hours) at 05/19/12 1307 Last data filed at 05/19/12 1130  Gross per 24 hour  Intake 7549.75 ml  Output   3860 ml  Net 3689.75 ml     Exam: General: No acute respiratory distress Lungs: Clear to auscultation bilaterally without wheezes or crackles Cardiovascular: Regular rate and rhythm without murmur gallop or rub normal S1 and S2, 2+ B LE edeam Abdomen: Nontender, nondistended, soft, bowel sounds positive, no rebound, no ascites, no appreciable mass Musculoskeletal: No significant cyanosis, clubbing of bilateral lower extremities Neurological: Alert and oriented x3, moves all extremities x4, exam non focal  Data Reviewed: Basic Metabolic Panel:  Lab 05/19/12 0824 05/18/12 1113  NA 133* 131*  K 3.2* 3.5  CL 96 89*  CO2 24 28  GLUCOSE 99 116*  BUN 7 11  CREATININE 0.46* 0.68  CALCIUM 8.4 8.9  MG -- --  PHOS -- --   Liver Function Tests:  Lab 05/18/12 1113  AST 14  ALT 9  ALKPHOS 135*  BILITOT 0.5  PROT 6.8  ALBUMIN 1.9*   CBC:  Lab 05/19/12 0824 05/18/12 1708 05/18/12 1113  WBC -- -- 21.4*  NEUTROABS -- -- 18.8*  HGB 8.3* 6.5* 6.9*    HCT 25.1* 20.2* 20.9*  MCV -- -- 82.6  PLT -- -- 599*   CBG: No results found for this basename: GLUCAP:5 in the last 168 hours  Recent Results (from the past 240 hour(s))  MRSA PCR SCREENING     Status: Normal   Collection Time   05/18/12  3:48 PM      Component Value  Range Status Comment   MRSA by PCR NEGATIVE  NEGATIVE Final      Studies:  Recent x-ray studies have been reviewed in detail by the Attending Physician  Scheduled Meds:  Reviewed in detail by the Attending Physician   Allison Ellis, ANP [facility] Office  [phone] Pager [phone]  On-Call/Text Page:      [email or web address]      password [email or web address]  If 7PM-7AM, please contact night-coverage [email or web address] Password [email or web address] 05/19/2012, 1:07 PM   LOS: 1 day   I have personally examined this patient and reviewed the entire database. I have reviewed the above note, made any necessary editorial changes, and agree with its content.  Denaisha Swango T. Chelcie Estorga, MD [facility]  

## 2012-05-19 NOTE — H&P (View-Only) (Signed)
[facility] Progress Note [place] TEAM 1 - Stepdown/ICU TEAM   [name] [name] ZOX:[id number] DOB: [birth year] DOA: 05/18/2012 PCP: Rudi Heap, MD  Brief narrative: 56 year old female with recent history of spinal surgery at [facility] in August. She was hospitalized at [facility] for left lower extremity DVT with associated bilateral pulmonary embolism. She subsequently was discharged on Lovenox and Coumadin. Patient had been taking her medication as prescribed and had been following with her outpatient physician. Two weeks ago her INR was 2.4. Three days prior to admission patient began having nausea and vomiting and was unable to keep any food down or fluids. She also began noticing bright red blood per rectum which she supposed was related to her history of hemorrhoids. She was also experiencing significant constipation. Two days prior to admission she began having pain in her right leg with associated swelling. She was also having dizziness and lightheadedness and near blackout spells with significant weakness. Because of these symptoms she presented to the emergency department. Evaluation there demonstrated a low hemoglobin of 6.9 and heme positive stool. She was also hypotensive and orthostatic and had associated tachycardia. She was also found to have a thrombocytosis with a platelet count of 599,000. She doesn't have leukocytosis with a white count greater than 20,000. During previous admission the patient presented with a hemoglobin of 6.9 and a platelet count of 592,000. On date of discharge 04/23/2012 her hemoglobin was 7.4 and her platelets were 670,000. Because of concerns with recurrent DVT and likely PE patient was admitted to the step down unit for further monitoring and treatment. It was important to note that her INR was therapeutic at presentation to the ER at 2.29 and this is quite concerning for an underlying hypercoagulable disorder.  Assessment/Plan:  Acute blood loss  anemia *Review of the laboratory data reveals that when patient presented initially in September hemoglobin was around 12 and at time of discharge hemoglobin had decreased to 7.4 *Patient has currently received 2 units of packed red blood cells this admission and repeat hemoglobin today is up to 8.3 *Continue to follow CBC every 12 hours  Lower GI bleed *Appreciate gastroenterology assistance *For EGD and colonoscopy today *Patient endorsed some red bleeding with bowel prep overnight but no abdominal pain *Patient also endorsed a history of GERD to the gastroenterologist  Iron deficiency *Likely related to chronic ongoing blood loss exacerbated by recent issues of poor nutrition. Patient and husband endorses patient has really not eaten much for at least 3 weeks *Iron was less than 10 on anemia panel this admission. May need to repeat after receipt of packed red blood cells and it remains low consider IV iron repletion  Pulmonary embolism, bilateral- moderate clot burden *This is recurrent and in the setting of therapeutic INR on Coumadin *Currently her pulmonary status is stable and she is on room air *She is also hemodynamically stable central indications at this time to pursue echocardiogram  Hypercoagulation syndrome/thrombocytosis *Very concerning since has redeveloped DVT and PE in setting of therapeutic INR on Coumadin *Because of recurrent PE an IVC filter was recommended and during insertion of this filter it was noted that this patient had extensive near occlusive thrombus extending to the IVC with nonvisualization of the left renal vein. *Appreciate hematology assistance. Full workup in process including hypercoagulable panel. *CT of the abdomen and pelvis shows no gross signs of malignancy. CEA level pending, in addition rule out for APLA or lupus anticoagulant pending  DVT, recurrent, lower extremity, acute- history  of LLE DVT 04/2012 *Still having discomfort in right lower  extremity *Workup and treatment as outlined above  Tobacco abuse *Counseled regarding cessation - an absolute must  HTN (hypertension) *Blood pressure well controlled *Home ACE inhibitor with thiazide diuretic currently on hold  History of back surgery/leukocytosis *Has been evaluated by neurosurgery this admission. Dr. [name] Dess has also reviewed the CT scan and discussed results with Dr. Sharon Seller. He mentioned abnormality regarding one of the screws in relationship to left common iliac vein is not an uncommon finding and Dr. [name] Dess does not feel that this has contributed to the patient's development of DVT *Dr. Janee Morn at [facility] performed the initial surgery in August 2013 *Patient with leukocytosis but no fever which is likely reactive and related to extensive thrombosis. As a precaution the patient was started on broad-spectrum antibiotics. No definitive infection or abscess seen on CT abdomen and pelvis  Hyponatremia/hypokalemia *Suspect at this point primarily related to bowel prep so we'll follow electrolyte panel and when can take oral we'll replete with combination of IV and oral medication  Constipation, chronic *Likely exacerbated by recent narcotic usage and suspect has contributed to patient's GI symptomatology in regards to nausea and vomiting *Currently should be adequately treated after bowel prep but will need to begin preventative measures once diet resumed  B12 deficiency  Begin SQ load - will need to be followed in outpt setting  DVT prophylaxis: Had IVC filter placed this admission. Currently on full dose heparin Code Status: Full Family Communication: Spoke directly with patient and her husband at the bedside Disposition Plan: Remain in step  Consultants: Hematology Pulmonology Gastroenterology Interventional radiology  Procedures: Placement of supra-renal IVC filter on 05/18/2012 interventional radiology EGD and colonoscopy by Dr.  Elnoria Howard pending on 05/19/2012  Antibiotics: Maxipime 10/1 >>>  HPI/Subjective: Patient alert and despite everything she's been through is quite animated and has a good sense of humor. She continues to complain of right lower extremity pain. Denies shortness of breath or chest pain. Still remains weak. Concerned about issues with her constipation.   Objective: Blood pressure 132/61, pulse 105, temperature 98 F (36.7 C), temperature source Oral, resp. rate 18, height 5\' 5"  (1.651 m), weight 76 kg (167 lb 8.8 oz), SpO2 97.00%.  Intake/Output Summary (Last 24 hours) at 05/19/12 1307 Last data filed at 05/19/12 1130  Gross per 24 hour  Intake 7549.75 ml  Output   3860 ml  Net 3689.75 ml     Exam: General: No acute respiratory distress Lungs: Clear to auscultation bilaterally without wheezes or crackles Cardiovascular: Regular rate and rhythm without murmur gallop or rub normal S1 and S2, 2+ B LE edeam Abdomen: Nontender, nondistended, soft, bowel sounds positive, no rebound, no ascites, no appreciable mass Musculoskeletal: No significant cyanosis, clubbing of bilateral lower extremities Neurological: Alert and oriented x3, moves all extremities x4, exam non focal  Data Reviewed: Basic Metabolic Panel:  Lab 05/19/12 1610 05/18/12 1113  NA 133* 131*  K 3.2* 3.5  CL 96 89*  CO2 24 28  GLUCOSE 99 116*  BUN 7 11  CREATININE 0.46* 0.68  CALCIUM 8.4 8.9  MG -- --  PHOS -- --   Liver Function Tests:  Lab 05/18/12 1113  AST 14  ALT 9  ALKPHOS 135*  BILITOT 0.5  PROT 6.8  ALBUMIN 1.9*   CBC:  Lab 05/19/12 0824 05/18/12 1708 05/18/12 1113  WBC -- -- 21.4*  NEUTROABS -- -- 18.8*  HGB 8.3* 6.5* 6.9*  HCT 25.1* 20.2* 20.9*  MCV -- -- 82.6  PLT -- -- 599*   CBG: No results found for this basename: GLUCAP:5 in the last 168 hours  Recent Results (from the past 240 hour(s))  MRSA PCR SCREENING     Status: Normal   Collection Time   05/18/12  3:48 PM      Component Value  Range Status Comment   MRSA by PCR NEGATIVE  NEGATIVE Final      Studies:  Recent x-ray studies have been reviewed in detail by the Attending Physician  Scheduled Meds:  Reviewed in detail by the Attending Physician   Junious Silk, ANP [facility] Office  [phone] Pager [phone]  On-Call/Text Page:      [email or web address]      password [email or web address]  If 7PM-7AM, please contact night-coverage [email or web address] Password [email or web address] 05/19/2012, 1:07 PM   LOS: 1 day   I have personally examined this patient and reviewed the entire database. I have reviewed the above note, made any necessary editorial changes, and agree with its content.  Lonia Blood, MD [facility]

## 2012-05-20 ENCOUNTER — Encounter (HOSPITAL_COMMUNITY): Payer: Self-pay | Admitting: Gastroenterology

## 2012-05-20 ENCOUNTER — Encounter (HOSPITAL_COMMUNITY): Payer: Self-pay

## 2012-05-20 DIAGNOSIS — M79609 Pain in unspecified limb: Secondary | ICD-10-CM

## 2012-05-20 DIAGNOSIS — M7989 Other specified soft tissue disorders: Secondary | ICD-10-CM

## 2012-05-20 DIAGNOSIS — I2699 Other pulmonary embolism without acute cor pulmonale: Secondary | ICD-10-CM

## 2012-05-20 DIAGNOSIS — T81718A Complication of other artery following a procedure, not elsewhere classified, initial encounter: Secondary | ICD-10-CM

## 2012-05-20 LAB — TYPE AND SCREEN
ABO/RH(D): A POS
Antibody Screen: NEGATIVE
Unit division: 0
Unit division: 0

## 2012-05-20 LAB — COMPREHENSIVE METABOLIC PANEL
ALT: 14 U/L (ref 0–35)
AST: 24 U/L (ref 0–37)
Albumin: 1.5 g/dL — ABNORMAL LOW (ref 3.5–5.2)
Alkaline Phosphatase: 176 U/L — ABNORMAL HIGH (ref 39–117)
BUN: 6 mg/dL (ref 6–23)
CO2: 23 mEq/L (ref 19–32)
Calcium: 8.3 mg/dL — ABNORMAL LOW (ref 8.4–10.5)
Chloride: 99 mEq/L (ref 96–112)
Creatinine, Ser: 0.49 mg/dL — ABNORMAL LOW (ref 0.50–1.10)
GFR calc Af Amer: >90 mL/min (ref >90)
GFR calc non Af Amer: >90 mL/min (ref >90)
Glucose, Bld: 105 mg/dL — ABNORMAL HIGH (ref 70–99)
Potassium: 2.9 mEq/L — ABNORMAL LOW (ref 3.5–5.1)
Sodium: 134 mEq/L — ABNORMAL LOW (ref 135–145)
Total Bilirubin: 0.7 mg/dL (ref 0.3–1.2)
Total Protein: 5.7 g/dL — ABNORMAL LOW (ref 6.0–8.3)

## 2012-05-20 LAB — HEPARIN LEVEL (UNFRACTIONATED)
Heparin Unfractionated: <0.1 IU/mL — ABNORMAL LOW (ref 0.30–0.70)
Heparin Unfractionated: <0.1 IU/mL — ABNORMAL LOW (ref 0.30–0.70)
Heparin Unfractionated: 0.56 IU/mL (ref 0.30–0.70)

## 2012-05-20 LAB — CBC
HCT: 24.2 % — ABNORMAL LOW (ref 36.0–46.0)
HCT: 25.9 % — ABNORMAL LOW (ref 36.0–46.0)
Hemoglobin: 8.1 g/dL — ABNORMAL LOW (ref 12.0–15.0)
Hemoglobin: 8.6 g/dL — ABNORMAL LOW (ref 12.0–15.0)
MCH: 27.5 pg (ref 26.0–34.0)
MCH: 27.7 pg (ref 26.0–34.0)
MCHC: 33.5 g/dL (ref 30.0–36.0)
MCHC: 33.2 g/dL (ref 30.0–36.0)
MCV: 82 fL (ref 78.0–100.0)
MCV: 83.3 fL (ref 78.0–100.0)
Platelets: 409 10*3/uL — ABNORMAL HIGH (ref 150–400)
Platelets: 489 10*3/uL — ABNORMAL HIGH (ref 150–400)
RBC: 2.95 MIL/uL — ABNORMAL LOW (ref 3.87–5.11)
RBC: 3.11 MIL/uL — ABNORMAL LOW (ref 3.87–5.11)
RDW: 15.9 % — ABNORMAL HIGH (ref 11.5–15.5)
RDW: 16 % — ABNORMAL HIGH (ref 11.5–15.5)
WBC: 15.1 10*3/uL — ABNORMAL HIGH (ref 4.0–10.5)
WBC: 14.4 10*3/uL — ABNORMAL HIGH (ref 4.0–10.5)

## 2012-05-20 LAB — LUPUS ANTICOAGULANT PANEL
DRVVT: 55 secs — ABNORMAL HIGH (ref <42.9)
Drvvt confirmation: 0.67 Ratio (ref <1.11)
Lupus Anticoagulant: DETECTED — AB
PTT Lupus Anticoagulant: 59.7 secs — ABNORMAL HIGH (ref 28.0–43.0)
PTTLA 4:1 Mix: 58.1 secs — ABNORMAL HIGH (ref 28.0–43.0)
PTTLA Confirmation: 12.2 secs — ABNORMAL HIGH (ref <8.0)
dRVVT Incubated 1:1 Mix: 43.1 secs — ABNORMAL HIGH (ref <42.9)

## 2012-05-20 LAB — FACTOR 5 LEIDEN

## 2012-05-20 LAB — POCT PREGNANCY, URINE: Preg Test, Ur: POSITIVE — AB

## 2012-05-20 LAB — CEA: CEA: 1.4 ng/mL (ref 0.0–5.0)

## 2012-05-20 MED ORDER — WHITE PETROLATUM GEL
Status: AC
  Filled 2012-05-20: qty 5

## 2012-05-20 MED ORDER — HEPARIN (PORCINE) IN NACL 100-0.45 UNIT/ML-% IJ SOLN
2550.0000 [IU]/h | INTRAMUSCULAR | Status: DC
  Administered 2012-05-20 (×2): 2700 [IU]/h via INTRAVENOUS
  Administered 2012-05-21: 2550 [IU]/h via INTRAVENOUS
  Filled 2012-05-20 (×6): qty 250

## 2012-05-20 MED ORDER — POTASSIUM CHLORIDE CRYS ER 20 MEQ PO TBCR
40.0000 meq | EXTENDED_RELEASE_TABLET | ORAL | Status: AC
  Administered 2012-05-20 (×2): 40 meq via ORAL
  Filled 2012-05-20 (×2): qty 2

## 2012-05-20 NOTE — Progress Notes (Signed)
SUBJECTIVE:  Right leg remains swollen and painful.  Pt has no signs of bleeding.  Denies chills, night sweats or chest pain.   MEDICATIONS:  Scheduled:   . cyanocobalamin  1,000 mcg Subcutaneous QPC supper  . pantoprazole (PROTONIX) IV  40 mg Intravenous Q12H  . polyethylene glycol powder  1 Container Oral Once  . potassium chloride  40 mEq Oral Q1 Hr x 2  . white petrolatum      . DISCONTD: ceFEPime (MAXIPIME) IV  1 g Intravenous Q8H   Continuous:   . sodium chloride 125 mL/hr at 05/20/12 1044  . heparin 2,700 Units/hr (05/20/12 1115)  . DISCONTD: sodium chloride    . DISCONTD: heparin 2,000 Units/hr (05/20/12 0324)   UJW:JXBJYNWGNFAOZ, acetaminophen, albuterol, alum & mag hydroxide-simeth, HYDROcodone-acetaminophen, HYDROmorphone (DILAUDID) injection, ondansetron (ZOFRAN) IV, ondansetron, DISCONTD: fentaNYL, DISCONTD: midazolam   PHYSICAL EXAM  Vital signs in last 24 hours: Temp:  [98 F (36.7 C)-100.1 F (37.8 C)] 98.4 F (36.9 C) (10/03 0829) Pulse Rate:  [99-120] 99  (10/03 0829) Resp:  [16-28] 20  (10/03 0700) BP: (89-163)/(39-103) 106/56 mmHg (10/03 0900) SpO2:  [95 %-100 %] 97 % (10/03 0829)  Intake/Output from previous day: 10/02 0701 - 10/03 0700 In: 5240.6 [P.O.:1920; I.V.:3310.6; IV Piggyback:10] Out: 3275 [Urine:500; Stool:2775] Intake/Output this shift: Total I/O In: 989 [P.O.:240; I.V.:739; IV Piggyback:10] Out: 1200 [Urine:1200]  General appearance: alert and cooperative  Resp: clear to auscultation bilaterally and normal percussion bilaterally  Cardio: regular rate and rhythm, S1, S2 normal, no murmur, click, rub or gallop  GI: soft, non-tender; bowel sounds normal; no masses, no organomegaly  Extremities: edema bilaterally to thighs  Skin - no petechiae   LABS:  CBC    Component Value Date/Time   WBC 15.1* 05/20/2012 0200   RBC 2.95* 05/20/2012 0200   HGB 8.1* 05/20/2012 0200   HCT 24.2* 05/20/2012 0200   PLT 409* 05/20/2012 0200   MCV 82.0  05/20/2012 0200   MCH 27.5 05/20/2012 0200   MCHC 33.5 05/20/2012 0200   RDW 15.9* 05/20/2012 0200   LYMPHSABS 1.2 05/18/2012 1113   MONOABS 1.4* 05/18/2012 1113   EOSABS 0.0 05/18/2012 1113   BASOSABS 0.0 05/18/2012 1113     Basename 05/20/12 0200 05/19/12 0824  NA 134* 133*  K 2.9* 3.2*  CL 99 96  CO2 23 24  GLUCOSE 105* 99  BUN 6 7  CREATININE 0.49* 0.46*  CALCIUM 8.3* 8.4     Ref. Range 05/18/2012 21:42  Anticardiolipin IgA Latest Range: <22 APL U/mL 8 (L)  Anticardiolipin IgG Latest Range: <23 GPL U/mL 12  Anticardiolipin IgM Latest Range: <11 MPL U/mL 0 (L)  Beta-2 Glyco I IgG Latest Range: <20 G Units 0  Beta-2-Glycoprotein I IgA Latest Range: <20 A Units 5  Beta-2-Glycoprotein I IgM Latest Range: <20 M Units 3   CEA level - 1.4     ASSESSMENT and PLAN:  Extensive thrombosis s/p IVC filter  Right leg still very painful and swollen.  On IV heparin but levels not therapeutic.  Requires higher doses to achieve a therapeutic level.  Upper and lower endoscopy unremarkable except for hemorrhoids/anal fissure.  Hypercoagulable work up still pending - b2 glycoprotein and cardiolipin were unremarkable.  I spoke with Dr Fredia Sorrow about thrombectomy/thrmbolysis.  He will see patient and give his recommendations.   Arlan Organ I., MD 05/20/2012

## 2012-05-20 NOTE — Progress Notes (Signed)
[facility] Progress Note [place] TEAM 1 - Stepdown/ICU TEAM   Gillian Rittenour NWG:[id number] DOB: [birth year] DOA: 05/18/2012 PCP: Rudi Heap, MD  Brief narrative: 56 year old female with recent history of spinal surgery at [facility] in August. She was hospitalized at [facility] for left lower extremity DVT with associated bilateral pulmonary embolism. She subsequently was discharged on Lovenox and Coumadin. Patient had been taking her medication as prescribed and had been following with her outpatient physician. Two weeks ago her INR was 2.4. Three days prior to admission patient began having nausea and vomiting and was unable to keep any food down or fluids. She also began noticing bright red blood per rectum which she supposed was related to her history of hemorrhoids. She was also experiencing significant constipation. Two days prior to admission she began having pain in her right leg with associated swelling. She was also having dizziness and lightheadedness and near blackout spells with significant weakness. Because of these symptoms she presented to the emergency department. Evaluation there demonstrated a low hemoglobin of 6.9 and heme positive stool. She was also hypotensive and orthostatic and had associated tachycardia. She was also found to have a thrombocytosis with a platelet count of 599,000. She doesn't have leukocytosis with a white count greater than 20,000. During previous admission the patient presented with a hemoglobin of 6.9 and a platelet count of 592,000. On date of discharge 04/23/2012 her hemoglobin was 7.4 and her platelets were 670,000. Because of concerns with recurrent DVT and likely PE patient was admitted to the step down unit for further monitoring and treatment. It was important to note that her INR was therapeutic at presentation to the ER at 2.29 and this is quite concerning for an underlying hypercoagulable disorder.  Assessment/Plan:  Acute blood loss  anemia *Review of the laboratory data reveals that when patient presented initially in September hemoglobin was around 12 and at time of discharge hemoglobin had decreased to 7.4- suspect it is mostly related to consumption *Patient has currently received 2 units of packed red blood cells this admission and hemoglobin stable at greater than 8 *Continue to follow CBC every 12 hours  Lower GI bleed *Appreciate gastroenterology assistance *EGD and colonoscopy completed 05/19/2012 and only revealed nonbleeding anal fissure *Patient also endorsed a history of GERD to the gastroenterologist  Iron deficiency *Likely related to chronic ongoing blood loss exacerbated by recent issues of poor nutrition. Patient and husband endorses patient has really not eaten much for at least 3 weeks *Iron was less than 10 on anemia panel this admission. May need to repeat after receipt of packed red blood cells and it remains low consider IV iron repletion  Pulmonary embolism, bilateral- moderate clot burden *This is recurrent and in the setting of therapeutic INR on Coumadin *Currently her pulmonary status is stable and she is on room air *She is also hemodynamically stable central indications at this time to pursue echocardiogram  Hypercoagulation syndrome/thrombocytosis *Very concerning since has redeveloped DVT and PE in setting of therapeutic INR on Coumadin *Because of recurrent PE an IVC filter was recommended and during insertion of this filter it was noted that this patient had extensive near occlusive thrombus extending to the IVC with nonvisualization of the left renal vein. *Appreciate hematology assistance. Full workup in process including hypercoagulable panel. *CT of the abdomen and pelvis shows no gross signs of malignancy. CEA level pending, in addition rule out for APLA or lupus anticoagulant pending-B2 glycoprotein and Cardiolite been more unremarkable  DVT,  recurrent, lower extremity, acute-  history of LLE DVT 04/2012/associated significant lower extremity edema *Still having discomfort in right lower extremity and significant edema *Workup and treatment as outlined above *Hematology has spoken with interventional radiology/Dr. Fredia Sorrow regarding evaluating the patient for possible thrombectomy or thrombolysis. Plan is to proceed on 10/4.  Tobacco abuse *Counseled regarding cessation - an absolute must  HTN (hypertension) *Blood pressure well controlled *Home ACE inhibitor with thiazide diuretic currently on hold  History of back surgery/leukocytosis *Has been evaluated by neurosurgery this admission. Dr. [name] Dess has also reviewed the CT scan and discussed results with Dr. Sharon Seller. He mentioned abnormality regarding one of the screws in relationship to left common iliac vein is not an uncommon finding and Dr. [name] Dess does not feel that this has contributed to the patient's development of DVT *Dr. Janee Morn at [facility] performed the initial surgery in August 2013 *Patient with leukocytosis but no fever which is likely reactive and related to extensive thrombosis. As a precaution the patient was started on broad-spectrum antibiotics. No definitive infection or abscess seen on CT abdomen and pelvis therefore antibiotics were discontinued 05/19/2012  Hyponatremia/hypokalemia *Suspect at this point primarily related to bowel prep so we'll follow electrolyte panel and when can take oral we'll replete with combination of IV and oral medication  Constipation, chronic *Likely exacerbated by recent narcotic usage and suspect has contributed to patient's GI symptomatology in regards to nausea and vomiting *Currently should be adequately treated after bowel prep but will need to begin preventative measures once diet resumed  B12 deficiency  Begin SQ load - will need to be followed in outpt setting  DVT prophylaxis: Had IVC filter placed this admission. Currently on  full dose heparin Code Status: Full Family Communication: Spoke directly with patient and her husband at the bedside Disposition Plan: Remain in step  Consultants: Hematology Pulmonology Gastroenterology Interventional radiology  Procedures: Placement of supra-renal IVC filter on 05/18/2012 interventional radiology EGD and colonoscopy by Dr. Elnoria Howard pending on 05/19/2012  Antibiotics: Maxipime 10/1 >>> 10/2  HPI/Subjective: Patient alert and primarily complaining of significant edema and discomfort and right lower extremity which has been significantly impairing her mobility. Denies chest pain, shortness of breath or recurrent lower GI bleeding symptoms   Objective: Blood pressure 97/57, pulse 101, temperature 98.7 F (37.1 C), temperature source Oral, resp. rate 20, height 5\' 5"  (1.651 m), weight 76 kg (167 lb 8.8 oz), SpO2 97.00%.  Intake/Output Summary (Last 24 hours) at 05/20/12 1308 Last data filed at 05/20/12 1200  Gross per 24 hour  Intake 3488.6 ml  Output   1700 ml  Net 1788.6 ml     Exam: General: No acute respiratory distress Lungs: Clear to auscultation bilaterally without wheezes or crackles Cardiovascular: Regular rate and rhythm without murmur gallop or rub normal S1 and S2, significant bilateral lower extremity edema 3+ right lower strategy and 2+ left lower extremity Abdomen: Nontender, nondistended, soft, bowel sounds positive, no rebound, no ascites, no appreciable mass Musculoskeletal: No significant cyanosis, clubbing of bilateral lower extremities Neurological: Alert and oriented x3, moves all extremities x4, exam non focal  Data Reviewed: Basic Metabolic Panel:  Lab 05/20/12 1610 05/19/12 0824 05/18/12 1113  NA 134* 133* 131*  K 2.9* 3.2* 3.5  CL 99 96 89*  CO2 23 24 28   GLUCOSE 105* 99 116*  BUN 6 7 11   CREATININE 0.49* 0.46* 0.68  CALCIUM 8.3* 8.4 8.9  MG -- -- --  PHOS -- -- --  Liver Function Tests:  Lab 05/20/12 0200 05/18/12 1113    AST 24 14  ALT 14 9  ALKPHOS 176* 135*  BILITOT 0.7 0.5  PROT 5.7* 6.8  ALBUMIN 1.5* 1.9*   CBC:  Lab 05/20/12 0200 05/19/12 1836 05/19/12 0824 05/18/12 1708 05/18/12 1113  WBC 15.1* 18.9* -- -- 21.4*  NEUTROABS -- -- -- -- 18.8*  HGB 8.1* 8.9* 8.3* 6.5* 6.9*  HCT 24.2* 26.4* 25.1* 20.2* 20.9*  MCV 82.0 82.5 -- -- 82.6  PLT 409* 449* -- -- 599*   CBG: No results found for this basename: GLUCAP:5 in the last 168 hours  Recent Results (from the past 240 hour(s))  MRSA PCR SCREENING     Status: Normal   Collection Time   05/18/12  3:48 PM      Component Value Range Status Comment   MRSA by PCR NEGATIVE  NEGATIVE Final   URINE CULTURE     Status: Normal   Collection Time   05/18/12 11:24 PM      Component Value Range Status Comment   Specimen Description URINE, CLEAN CATCH   Final    Special Requests NONE   Final    Culture  Setup Time 05/19/2012 01:00   Final    Colony Count NO GROWTH   Final    Culture NO GROWTH   Final    Report Status 05/19/2012 FINAL   Final      Studies:  Recent x-ray studies have been reviewed in detail by the Attending Physician  Scheduled Meds:  Reviewed in detail by the Attending Physician   Junious Silk, ANP [facility] Office  [phone] Pager [phone]  On-Call/Text Page:      [email or web address]      password [email or web address]  If 7PM-7AM, please contact night-coverage [email or web address] Password [email or web address] 05/20/2012, 1:08 PM   LOS: 2 days    I have examined the patient and reviewed the chart. I have modified the above note and agree with it. Thrombectomy planned for 10/4 AM.  Calvert Cantor, MD [phone]

## 2012-05-20 NOTE — Progress Notes (Signed)
ANTICOAGULATION CONSULT NOTE - Follow Up Consult  Pharmacy Consult for heparin Indication: DVT  Labs:  Basename 05/20/12 0200 05/19/12 1837 05/19/12 1836 05/19/12 0824 05/18/12 1113  HGB 8.1* -- 8.9* -- --  HCT 24.2* -- 26.4* 25.1* --  PLT 409* -- 449* -- 599*  APTT -- -- -- -- --  LABPROT -- -- -- -- 24.2*  INR -- -- -- -- 2.29*  HEPARINUNFRC <0.10* <0.10* -- <0.10* --  CREATININE -- -- -- 0.46* 0.68  CKTOTAL -- -- -- -- --  CKMB -- -- -- -- --  TROPONINI -- -- -- -- --    Assessment: 56yo female remains subtherapeutic on heparin after rate increases, though noted that on recent admission pt required 2700 units/hr for levels at goal.  No bleeding x24hr per RN.  Goal of Therapy:  Heparin level 0.3-0.7 units/ml   Plan:  Will increase heparin gtt fairly aggressively to 2000 units/hr though still well below prior rate and check level in 6hr.  Colleen Can PharmD BCPS 05/20/2012,3:01 AM

## 2012-05-20 NOTE — Progress Notes (Signed)
eLink Physician-Brief Progress Note Patient Name: [name] [name] DOB: [birth year] MRN: [id number]  Date of Service  05/20/2012   HPI/Events of Note   hypokalemia  eICU Interventions  Potassium replaced   Intervention Category Intermediate Interventions: Electrolyte abnormality - evaluation and management  Elridge Stemm 05/20/2012, 3:12 AM

## 2012-05-20 NOTE — Progress Notes (Signed)
ANTICOAGULATION CONSULT NOTE - Follow Up Consult  Pharmacy Consult for Heparin Indication: DVT  Allergies  Allergen Reactions  . Prednisone Other (See Comments)    No energy "stalls out"  . Tramadol Other (See Comments)    constipation    Patient Measurements: Heparin Dosing Weight: 73kg  Vital Signs: Temp: 98.5 F (36.9 C) (10/03 1540) Temp src: Oral (10/03 1540) BP: 103/51 mmHg (10/03 1540) Pulse Rate: 101  (10/03 1246)  Labs:  Basename 05/20/12 1707 05/20/12 0800 05/20/12 0200 05/19/12 1836 05/19/12 0824 05/18/12 1113  HGB 8.6* -- 8.1* -- -- --  HCT 25.9* -- 24.2* 26.4* -- --  PLT 489* -- 409* 449* -- --  APTT -- -- -- -- -- --  LABPROT -- -- -- -- -- 24.2*  INR -- -- -- -- -- 2.29*  HEPARINUNFRC 0.56 <0.10* <0.10* -- -- --  CREATININE -- -- 0.49* -- 0.46* 0.68  CKTOTAL -- -- -- -- -- --  CKMB -- -- -- -- -- --  TROPONINI -- -- -- -- -- --    Estimated Creatinine Clearance: 80.1 ml/min (by C-G formula based on Cr of 0.49).   Medications:  Heparin @ 2000 units/hr  Assessment: 55yof continues on heparin and now at goal on 2700 units/hr. Patient noted with extensive clot burden and need for high heparin rates at last admission.  Also noted that course has been complicated complicated by anemia and need for PRBCs.  Goal of Therapy:  Heparin level 0.3-0.7 units/ml Monitor platelets by anticoagulation protocol: Yes   Plan:  -No heparin changes needed -Will re-check a level in am  Harland German, Pharm D 05/20/2012 6:05 PM

## 2012-05-20 NOTE — Progress Notes (Signed)
Subjective: No acute events.  She was able to finally sleep after all her testing.  Objective: Vital signs in last 24 hours: Temp:  [98 F (36.7 C)-100.1 F (37.8 C)] 98.7 F (37.1 C) (10/03 1246) Pulse Rate:  [99-120] 101  (10/03 1246) Resp:  [16-28] 20  (10/03 0700) BP: (89-163)/(39-103) 97/57 mmHg (10/03 1246) SpO2:  [95 %-100 %] 97 % (10/03 1246) Last BM Date: 05/20/12  Intake/Output from previous day: 10/02 0701 - 10/03 0700 In: 5240.6 [P.O.:1920; I.V.:3310.6; IV Piggyback:10] Out: 3275 [Urine:500; Stool:2775] Intake/Output this shift: Total I/O In: 1381 [P.O.:480; I.V.:891; IV Piggyback:10] Out: 1200 [Urine:1200]  General appearance: alert and no distress GI: soft, non-tender; bowel sounds normal; no masses,  no organomegaly Rectal: Anterior anal fissure, no masses  Lab Results:  Basename 05/20/12 0200 05/19/12 1836 05/19/12 0824 05/18/12 1113  WBC 15.1* 18.9* -- 21.4*  HGB 8.1* 8.9* 8.3* --  HCT 24.2* 26.4* 25.1* --  PLT 409* 449* -- 599*   BMET  Basename 05/20/12 0200 05/19/12 0824 05/18/12 1113  NA 134* 133* 131*  K 2.9* 3.2* 3.5  CL 99 96 89*  CO2 23 24 28   GLUCOSE 105* 99 116*  BUN 6 7 11   CREATININE 0.49* 0.46* 0.68  CALCIUM 8.3* 8.4 8.9   LFT  Basename 05/20/12 0200  PROT 5.7*  ALBUMIN 1.5*  AST 24  ALT 14  ALKPHOS 176*  BILITOT 0.7  BILIDIR --  IBILI --   PT/INR  Basename 05/18/12 1113  LABPROT 24.2*  INR 2.29*   Hepatitis Panel No results found for this basename: HEPBSAG,HCVAB,HEPAIGM,HEPBIGM in the last 72 hours C-Diff No results found for this basename: CDIFFTOX:3 in the last 72 hours Fecal Lactopherrin No results found for this basename: FECLLACTOFRN in the last 72 hours  Studies/Results: Ct Abdomen Pelvis W Contrast  05/19/2012  **ADDENDUM** CREATED: 05/19/2012 01:02:48  I discussed these findings by telephone with Tama Gander, of the Hospitalist service at to 0103 hours on 05/19/2012.  **END ADDENDUM** SIGNED BY: Minerva Areola  A. Molli Posey, M.D.   05/19/2012  *RADIOLOGY REPORT*  Clinical Data: Heme-positive stools.  IVC thrombus.  Status post suprarenal IVC filter placement.  CT ABDOMEN AND PELVIS WITH CONTRAST  Technique:  Multidetector CT imaging of the abdomen and pelvis was performed following the standard protocol during bolus administration of intravenous contrast.  Contrast: OMNIPAQUE IOHEXOL 300 MG/ML  SOLN  Comparison: None.  Findings: Diffuse fatty changes seen in the liver.  No focal abnormality is noted in the liver or spleen.  The stomach, duodenum, pancreas, gallbladder, adrenal glands, and kidneys have normal imaging features.  No abdominal aortic aneurysm.  The suprarenal IVC filter is evident.  There is occlusive thrombus in the IVC, extending from the level of the left renal vein down both common iliac veins. Clot propagation into both internal iliac veins is also noted. There is no free fluid or lymphadenopathy in the abdomen.  Imaging through the pelvis shows bilateral pedicle screws at the L4 and L5 levels.  The pedicle screws at L5 projects through the anterior cortex of the L5 vertebral body.  The tip of the left pedicle screw appears to involve the left common iliac vein.  There is retroperitoneal edema in the upper pelvis bilaterally which tracks into the pelvic sidewalls and pre for sacral space of the pelvis.  No substantial intraperitoneal free fluid in the pelvis.  Uterus is unremarkable.  No evidence for adnexal mass. The terminal ileum is normal.  The appendix is normal.  There is edema within the proximal right thigh and groin region.  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  There is retroperitoneal and pelvic sidewall edema bilaterally with right greater than left proximal thigh edema. The edema is presumably secondary to the venous thrombosis.  The patient is noted to have bilateral  pedicle screws at L4 and L5 and the left L5 pedicle screw passes through the anterior L5 cortex with the tip of the screw this apparently involving the left common iliac vein.   Original Report Authenticated By: ERIC A. MANSELL, M.D.    Ir Ivc Filter Plmt / S&i /img Guid/mod Sed  05/18/2012  *RADIOLOGY REPORT*  Indication:  History of pulmonary embolism, now with bilateral lower extremity DVT and lower GI bleed.  ULTRASOUND GUIDANCE FOR VASCULAR ACCESS IVC CATHETERIZATION AND VENOGRAM IVC FILTER INSERTION  Comparison: Chest CT - 04/20/2012; left lower extremity venous Doppler ultrasound - 05/12/2012; right lower extremity venous Doppler ultrasound - earlier same day  Medications: Fentanyl 150 mcg IV; Versed 1.5 mg IV  Contrast: 100 mL Omnipaque-300  Sedation time: 30 minutes  Fluoroscopy time: 3.9 minutes  Complications: None immediate  PROCEDURE/FINDINGS:  Informed consent was obtained from the patient following explanation of the procedure, risks (including non-retrieval and caval thrombosis), benefits and alternatives.  The patient understands, agrees and consents for the procedure.  All questions were addressed.  A time out was performed prior to the initiation of the procedure.  Maximal barrier sterile technique utilized including caps, mask, sterile gowns, sterile gloves, large sterile drape, hand hygiene, and Betadine prep.  Under sterile condition and local anesthesia, right internal jugular venous access was performed with ultrasound.  An ultrasound image was saved and sent to PACS.  Over a guide wire, the IVC filter delivery sheath and inner dilator were advanced into the superior aspect of the IVC.  As there was difficulty advancing the guidewire more caudally, limited venogram was performed demonstrating near complete thrombosis of the peri-renal IVC.  With use of a regular Glidewire, the sheath was advanced more caudally with a repeat venograms demonstrate near complete thrombosis of the infra-renal  IVC extending into the bilateral common iliac veins.  The introducer sheath was retracted and repeat venogram was performed delineating the location of the right renal vein, however there was no definite opacification of the left renal vein.  As such, the decision was made to place a suprarenal IVC filter.  Through the delivery sheath, a retrievable Celect IVC filter was deployed above the level of the right renal vein, below the caval atrial junction.  Limited post deployment venogram confirmed appropriate positioning.  The delivery sheath was removed and hemostasis was obtained with manual compression.  A dressing was placed.  The patient tolerated the procedure well without immediate post procedural complication.  IMPRESSION:  1.  Successful ultrasound and fluoroscopic guided placement of a suprarenal retrievable IVC filter.  2.  Near complete thrombosis of the IVC to the level of the renal veins with no definite opacification of the left renal vein.  Above findings discussed with Dr. Isidoro Donning at [phone].   Original Report Authenticated By: Waynard Reeds, M.D.    Dg Abd Acute W/chest  05/18/2012  *RADIOLOGY REPORT*  Clinical Data: Sepsis and abdominal pain.  ACUTE ABDOMEN SERIES (ABDOMEN 2 VIEW & CHEST 1 VIEW)  Comparison: None  Findings: The cardiomediastinal silhouette is unremarkable. The lungs are clear. There  is no of airspace disease, pleural effusion or pneumothorax.  Moderate stool in the ascending colon noted. Gas is present in the remainder of the colon and rectum. A few nondistended gas-filled loops small bowel within the left abdomen are identified. There is no evidence of bowel obstruction or pneumoperitoneum. Postoperative changes of the lower lumbar spine are present.  IMPRESSION: Nonspecific nonobstructive bowel gas pattern - no evidence of pneumoperitoneum.  No evidence of active cardiopulmonary disease.   Original Report Authenticated By: Rosendo Gros, M.D.     Medications:  Scheduled:   .  cyanocobalamin  1,000 mcg Subcutaneous QPC supper  . pantoprazole (PROTONIX) IV  40 mg Intravenous Q12H  . polyethylene glycol powder  1 Container Oral Once  . potassium chloride  40 mEq Oral Q1 Hr x 2  . white petrolatum      . DISCONTD: ceFEPime (MAXIPIME) IV  1 g Intravenous Q8H   Continuous:   . sodium chloride 125 mL/hr at 05/20/12 1044  . heparin 2,700 Units/hr (05/20/12 1300)  . DISCONTD: sodium chloride    . DISCONTD: heparin 2,000 Units/hr (05/20/12 0324)    Assessment/Plan: 1) Anterior anal fissure. 2) Hematochezia secondary to #1. 3) Hypercoagulable state.   No evidence of any malignancy with the EGD/Colonoscopy.  I reevaluated her anal fissure today as it is a deep fissure and unusual.  I am satisfied that it is not a malignant lesion.  No masses palpated and the patient denies engaging any any anal intercourse that can predispose to [facility] of the anus.  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  Plan: 1) Stool softeners. 2) Continue with hypercoagulable work up. 3) Signing off at this time.  Call with any questions.  LOS: 2 days   Trampus Mcquerry D 05/20/2012, 1:35 PM

## 2012-05-20 NOTE — H&P (Signed)
Agree with above.  Patient seen and examined.  Status post suprarenal IVC filter placement on 10/2.  CT shows complete thrombosis of iliac veins bilaterally and occlusive thrombus in IVC up to level of renal veins.  There has been progressive, symptomatic edema of RLE over last several days.  No further evidence of GI bleed with anal fissure on exam by Dr. Elnoria Howard yest.  No retroperitoneal hemorrhage identified by CT on 10/2 at level of prior lumbar fusion, but left L5 pedicle screw does extend beyond vertebral body margin and may have injured left common iliac vein.  I reviewed indications and risks of venous thrombolysis with the patient and her husband.  There are certainly some increased risks of bleeding in this patient given recent history.  Procedure will likely require at least 2-3 days of thrombolytic infusion therapy and potential placement of stents given massive clot burden.  May be able to remove IVC filter at a later time after thrombus treated.  Treatment may reduce future morbidity from massive thrombosis, but patient may still be left with chronic LE edema.  After discussion, patient would like to proceed.  We will plan to start thrombolytic therapy via bilateral LE venous access tomorrow.

## 2012-05-20 NOTE — H&P (Signed)
[name] [name] is an 56 y.o. female.    Chief Complaint: Back fusion surgery at [facility] 04/05/12. Pt did so well after surgery- went home 8/22. 3 weeks later noticed left leg swelling and pain Was seen at [facility] 9/3; US showed + LLE DVT and + Pulmonary embolus. Was started on Lovenox and coumadin and was sent home with these meds on 04/23/12. Left leg continued to have decrease in swelling and pain. 9/29 pt noticed Rt leg pain and swelling and returned to [facility] on 10/1. Now with RLE DVT. CT shows venous clot to involve iliac veins and inferior vena cava. IVC filter was placed 10/2 after new clot developed while on anticoagulation. Pt was noted to have rectal bleeding 9/28; none since. EGD and colonoscopy 10/2 only shows deep anal fissure and hemorrhoids; no active bleeding site. H/H followed closely and has been stable for approx 72 hrs Pt now scheduled for possible Transcatheter Bilateral venous thrombolysis and mechanical thrombectomy of bilateral lower extremities; iliac veins and inferior vena cava with possible angioplasty/stent placement  HPI: back pain; DVT; PE  Past Medical History  Diagnosis Date  . DVT (deep venous thrombosis)   . Pulmonary embolism   . Back pain     Past Surgical History  Procedure Date  . Back surgery   . Colonoscopy 05/19/2012    Procedure: COLONOSCOPY;  Surgeon: Theda Belfast, MD;  Location: [facility] ENDOSCOPY;  Service: Endoscopy;  Laterality: N/A;  . Esophagogastroduodenoscopy 05/19/2012    Procedure: ESOPHAGOGASTRODUODENOSCOPY (EGD);  Surgeon: Theda Belfast, MD;  Location: [facility] ENDOSCOPY;  Service: Endoscopy;  Laterality: N/A;    Family History  Problem Relation Age of Onset  . Leukemia Mother   . Prostate cancer Father    Social History:  reports that she has been smoking.  She does not have any smokeless tobacco history on file. She reports that she drinks alcohol. She reports that she does not use illicit drugs.  Allergies:  Allergies    Allergen Reactions  . Prednisone Other (See Comments)    No energy "stalls out"  . Tramadol Other (See Comments)    constipation    Medications Prior to Admission  Medication Sig Dispense Refill  . bisacodyl (DULCOLAX) 5 MG EC tablet Take 15 mg by mouth daily as needed. constipation      . lisinopril-hydrochlorothiazide (PRINZIDE,ZESTORETIC) 20-12.5 MG per tablet Take 0.5 tablets by mouth daily as needed. If blood pressure greater than 135/90      . oxyCODONE-acetaminophen (PERCOCET/ROXICET) 5-325 MG per tablet Take 2 tablets by mouth every 6 (six) hours as needed. pain      . sennosides-docusate sodium (SENOKOT-S) 8.6-50 MG tablet Take 2 tablets by mouth every evening.      . warfarin (COUMADIN) 2 MG tablet Take 2 mg by mouth daily.        Results for orders placed during the hospital encounter of 05/18/12 (from the past 48 hour(s))  LACTIC ACID, PLASMA     Status: Normal   Collection Time   05/18/12  2:51 PM      Component Value Range Comment   Lactic Acid, Venous 0.9  0.5 - 2.2 mmol/L   MRSA PCR SCREENING     Status: Normal   Collection Time   05/18/12  3:48 PM      Component Value Range Comment   MRSA by PCR NEGATIVE  NEGATIVE   HEMOGLOBIN AND HEMATOCRIT, BLOOD     Status: Abnormal   Collection Time  05/18/12  5:08 PM      Component Value Range Comment   Hemoglobin 6.5 (*) 12.0 - 15.0 g/dL CRITICAL VALUE NOTED.  VALUE IS CONSISTENT WITH PREVIOUSLY REPORTED AND CALLED VALUE.   HCT 20.2 (*) 36.0 - 46.0 %   VITAMIN B12     Status: Normal   Collection Time   05/18/12  5:08 PM      Component Value Range Comment   Vitamin B-12 285  211 - 911 pg/mL   FOLATE     Status: Normal   Collection Time   05/18/12  5:08 PM      Component Value Range Comment   Folate 4.3     IRON AND TIBC     Status: Abnormal   Collection Time   05/18/12  5:08 PM      Component Value Range Comment   Iron <10 (*) 42 - 135 ug/dL    TIBC Not calculated due to Iron <10.  250 - 470 ug/dL    Saturation  Ratios Not calculated due to Iron <10.  20 - 55 %    UIBC 137  125 - 400 ug/dL   FERRITIN     Status: Abnormal   Collection Time   05/18/12  5:08 PM      Component Value Range Comment   Ferritin 320 (*) 10 - 291 ng/mL   RETICULOCYTES     Status: Abnormal   Collection Time   05/18/12  5:08 PM      Component Value Range Comment   Retic Ct Pct 1.7  0.4 - 3.1 %    RBC. 2.44 (*) 3.87 - 5.11 MIL/uL    Retic Count, Manual 41.5  19.0 - 186.0 K/uL   BETA-2-GLYCOPROTEIN I ABS, IGG/M/A     Status: Normal   Collection Time   05/18/12  9:42 PM      Component Value Range Comment   Beta-2 Glyco I IgG 0  <20 G Units    Beta-2-Glycoprotein I IgM 3  <20 M Units    Beta-2-Glycoprotein I IgA 5  <20 A Units   CARDIOLIPIN ANTIBODIES, IGG, IGM, IGA     Status: Abnormal   Collection Time   05/18/12  9:42 PM      Component Value Range Comment   Anticardiolipin IgG 12  <23 GPL U/mL    Anticardiolipin IgM 0 (*) <11 MPL U/mL    Anticardiolipin IgA 8 (*) <22 APL U/mL   VITAMIN B12     Status: Normal   Collection Time   05/18/12  9:42 PM      Component Value Range Comment   Vitamin B-12 247  211 - 911 pg/mL   FOLATE     Status: Normal   Collection Time   05/18/12  9:42 PM      Component Value Range Comment   Folate 3.9     IRON AND TIBC     Status: Abnormal   Collection Time   05/18/12  9:42 PM      Component Value Range Comment   Iron <10 (*) 42 - 135 ug/dL    TIBC Not calculated due to Iron <10.  250 - 470 ug/dL    Saturation Ratios Not calculated due to Iron <10.  20 - 55 %    UIBC 113 (*) 125 - 400 ug/dL   FERRITIN     Status: Abnormal   Collection Time   05/18/12  9:42 PM      Component Value  Range Comment   Ferritin 332 (*) 10 - 291 ng/mL   RETICULOCYTES     Status: Abnormal   Collection Time   05/18/12  9:42 PM      Component Value Range Comment   Retic Ct Pct 1.4  0.4 - 3.1 %    RBC. 2.45 (*) 3.87 - 5.11 MIL/uL    Retic Count, Manual 34.3  19.0 - 186.0 K/uL   HOMOCYSTEINE     Status:  Normal   Collection Time   05/18/12  9:42 PM      Component Value Range Comment   Homocysteine 6.2  4.0 - 15.4 umol/L   URINALYSIS, ROUTINE W REFLEX MICROSCOPIC     Status: Abnormal   Collection Time   05/18/12 11:24 PM      Component Value Range Comment   Color, Urine YELLOW  YELLOW    APPearance CLEAR  CLEAR    Specific Gravity, Urine 1.040 (*) 1.005 - 1.030    pH 6.5  5.0 - 8.0    Glucose, UA NEGATIVE  NEGATIVE mg/dL    Hgb urine dipstick MODERATE (*) NEGATIVE    Bilirubin Urine NEGATIVE  NEGATIVE    Ketones, ur 15 (*) NEGATIVE mg/dL    Protein, ur NEGATIVE  NEGATIVE mg/dL    Urobilinogen, UA 0.2  0.0 - 1.0 mg/dL    Nitrite NEGATIVE  NEGATIVE    Leukocytes, UA TRACE (*) NEGATIVE   URINE CULTURE     Status: Normal   Collection Time   05/18/12 11:24 PM      Component Value Range Comment   Specimen Description URINE, CLEAN CATCH      Special Requests NONE      Culture  Setup Time 05/19/2012 01:00      Colony Count NO GROWTH      Culture NO GROWTH      Report Status 05/19/2012 FINAL     URINE MICROSCOPIC-ADD ON     Status: Abnormal   Collection Time   05/18/12 11:24 PM      Component Value Range Comment   Squamous Epithelial / LPF FEW (*) RARE    WBC, UA 0-2  <3 WBC/hpf    RBC / HPF 3-6  <3 RBC/hpf    Bacteria, UA RARE  RARE   BASIC METABOLIC PANEL     Status: Abnormal   Collection Time   05/19/12  8:24 AM      Component Value Range Comment   Sodium 133 (*) 135 - 145 mEq/L    Potassium 3.2 (*) 3.5 - 5.1 mEq/L    Chloride 96  96 - 112 mEq/L    CO2 24  19 - 32 mEq/L    Glucose, Bld 99  70 - 99 mg/dL    BUN 7  6 - 23 mg/dL    Creatinine, Ser 0.86 (*) 0.50 - 1.10 mg/dL    Calcium 8.4  8.4 - 57.8 mg/dL    GFR calc non Af Amer >90  >90 mL/min    GFR calc Af Amer >90  >90 mL/min   HEPARIN LEVEL (UNFRACTIONATED)     Status: Abnormal   Collection Time   05/19/12  8:24 AM      Component Value Range Comment   Heparin Unfractionated <0.10 (*) 0.30 - 0.70 IU/mL   HEMOGLOBIN AND  HEMATOCRIT, BLOOD     Status: Abnormal   Collection Time   05/19/12  8:24 AM      Component Value Range Comment  Hemoglobin 8.3 (*) 12.0 - 15.0 g/dL    HCT 08.6 (*) 57.8 - 46.0 %   CBC     Status: Abnormal   Collection Time   05/19/12  6:36 PM      Component Value Range Comment   WBC 18.9 (*) 4.0 - 10.5 K/uL    RBC 3.20 (*) 3.87 - 5.11 MIL/uL    Hemoglobin 8.9 (*) 12.0 - 15.0 g/dL    HCT 46.9 (*) 62.9 - 46.0 %    MCV 82.5  78.0 - 100.0 fL    MCH 27.8  26.0 - 34.0 pg    MCHC 33.7  30.0 - 36.0 g/dL    RDW 52.8 (*) 41.3 - 15.5 %    Platelets 449 (*) 150 - 400 K/uL DELTA CHECK NOTED  HEPARIN LEVEL (UNFRACTIONATED)     Status: Abnormal   Collection Time   05/19/12  6:37 PM      Component Value Range Comment   Heparin Unfractionated <0.10 (*) 0.30 - 0.70 IU/mL   CEA     Status: Normal   Collection Time   05/19/12  6:37 PM      Component Value Range Comment   CEA 1.4  0.0 - 5.0 ng/mL   HEPARIN LEVEL (UNFRACTIONATED)     Status: Abnormal   Collection Time   05/20/12  2:00 AM      Component Value Range Comment   Heparin Unfractionated <0.10 (*) 0.30 - 0.70 IU/mL   CBC     Status: Abnormal   Collection Time   05/20/12  2:00 AM      Component Value Range Comment   WBC 15.1 (*) 4.0 - 10.5 K/uL    RBC 2.95 (*) 3.87 - 5.11 MIL/uL    Hemoglobin 8.1 (*) 12.0 - 15.0 g/dL    HCT 24.4 (*) 01.0 - 46.0 %    MCV 82.0  78.0 - 100.0 fL    MCH 27.5  26.0 - 34.0 pg    MCHC 33.5  30.0 - 36.0 g/dL    RDW 27.2 (*) 53.6 - 15.5 %    Platelets 409 (*) 150 - 400 K/uL   COMPREHENSIVE METABOLIC PANEL     Status: Abnormal   Collection Time   05/20/12  2:00 AM      Component Value Range Comment   Sodium 134 (*) 135 - 145 mEq/L    Potassium 2.9 (*) 3.5 - 5.1 mEq/L    Chloride 99  96 - 112 mEq/L    CO2 23  19 - 32 mEq/L    Glucose, Bld 105 (*) 70 - 99 mg/dL    BUN 6  6 - 23 mg/dL    Creatinine, Ser 6.44 (*) 0.50 - 1.10 mg/dL    Calcium 8.3 (*) 8.4 - 10.5 mg/dL    Total Protein 5.7 (*) 6.0 - 8.3 g/dL      Albumin 1.5 (*) 3.5 - 5.2 g/dL    AST 24  0 - 37 U/L    ALT 14  0 - 35 U/L    Alkaline Phosphatase 176 (*) 39 - 117 U/L    Total Bilirubin 0.7  0.3 - 1.2 mg/dL    GFR calc non Af Amer >90  >90 mL/min    GFR calc Af Amer >90  >90 mL/min   HEPARIN LEVEL (UNFRACTIONATED)     Status: Abnormal   Collection Time   05/20/12  8:00 AM      Component Value Range Comment  Heparin Unfractionated <0.10 (*) 0.30 - 0.70 IU/mL    Ct Abdomen Pelvis W Contrast  05/19/2012  **ADDENDUM** CREATED: 05/19/2012 01:02:48  I discussed these findings by telephone with Tama Gander, of the Hospitalist service at to 0103 hours on 05/19/2012.  **END ADDENDUM** SIGNED BY: Minerva Areola A. Molli Posey, M.D.   05/19/2012  *RADIOLOGY REPORT*  Clinical Data: Heme-positive stools.  IVC thrombus.  Status post suprarenal IVC filter placement.  CT ABDOMEN AND PELVIS WITH CONTRAST  Technique:  Multidetector CT imaging of the abdomen and pelvis was performed following the standard protocol during bolus administration of intravenous contrast.  Contrast: OMNIPAQUE IOHEXOL 300 MG/ML  SOLN  Comparison: None.  Findings: Diffuse fatty changes seen in the liver.  No focal abnormality is noted in the liver or spleen.  The stomach, duodenum, pancreas, gallbladder, adrenal glands, and kidneys have normal imaging features.  No abdominal aortic aneurysm.  The suprarenal IVC filter is evident.  There is occlusive thrombus in the IVC, extending from the level of the left renal vein down both common iliac veins. Clot propagation into both internal iliac veins is also noted. There is no free fluid or lymphadenopathy in the abdomen.  Imaging through the pelvis shows bilateral pedicle screws at the L4 and L5 levels.  The pedicle screws at L5 projects through the anterior cortex of the L5 vertebral body.  The tip of the left pedicle screw appears to involve the left common iliac vein.  There is retroperitoneal edema in the upper pelvis bilaterally which tracks  into the pelvic sidewalls and pre for sacral space of the pelvis.  No substantial intraperitoneal free fluid in the pelvis.  Uterus is unremarkable.  No evidence for adnexal mass. The terminal ileum is normal.  The appendix is normal.  There is edema within the proximal right thigh and groin region.  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  There is retroperitoneal and pelvic sidewall edema bilaterally with right greater than left proximal thigh edema. The edema is presumably secondary to the venous thrombosis.  The patient is noted to have bilateral pedicle screws at L4 and L5 and the left L5 pedicle screw passes through the anterior L5 cortex with the tip of the screw this apparently involving the left common iliac vein.   Original Report Authenticated By: ERIC A. MANSELL, M.D.    Ir Ivc Filter Plmt / S&i /img Guid/mod Sed  05/18/2012  *RADIOLOGY REPORT*  Indication:  History of pulmonary embolism, now with bilateral lower extremity DVT and lower GI bleed.  ULTRASOUND GUIDANCE FOR VASCULAR ACCESS IVC CATHETERIZATION AND VENOGRAM IVC FILTER INSERTION  Comparison: Chest CT - 04/20/2012; left lower extremity venous Doppler ultrasound - 05/12/2012; right lower extremity venous Doppler ultrasound - earlier same day  Medications: Fentanyl 150 mcg IV; Versed 1.5 mg IV  Contrast: 100 mL Omnipaque-300  Sedation time: 30 minutes  Fluoroscopy time: 3.9 minutes  Complications: None immediate  PROCEDURE/FINDINGS:  Informed consent was obtained from the patient following explanation of the procedure, risks (including non-retrieval and caval thrombosis), benefits and alternatives.  The patient understands, agrees and consents for the procedure.  All questions were addressed.  A time out was performed prior to the initiation of the procedure.  Maximal barrier sterile technique utilized including caps, mask, sterile  gowns, sterile gloves, large sterile drape, hand hygiene, and Betadine prep.  Under sterile condition and local anesthesia, right internal jugular venous  access was performed with ultrasound.  An ultrasound image was saved and sent to PACS.  Over a guide wire, the IVC filter delivery sheath and inner dilator were advanced into the superior aspect of the IVC.  As there was difficulty advancing the guidewire more caudally, limited venogram was performed demonstrating near complete thrombosis of the peri-renal IVC.  With use of a regular Glidewire, the sheath was advanced more caudally with a repeat venograms demonstrate near complete thrombosis of the infra-renal IVC extending into the bilateral common iliac veins.  The introducer sheath was retracted and repeat venogram was performed delineating the location of the right renal vein, however there was no definite opacification of the left renal vein.  As such, the decision was made to place a suprarenal IVC filter.  Through the delivery sheath, a retrievable Celect IVC filter was deployed above the level of the right renal vein, below the caval atrial junction.  Limited post deployment venogram confirmed appropriate positioning.  The delivery sheath was removed and hemostasis was obtained with manual compression.  A dressing was placed.  The patient tolerated the procedure well without immediate post procedural complication.  IMPRESSION:  1.  Successful ultrasound and fluoroscopic guided placement of a suprarenal retrievable IVC filter.  2.  Near complete thrombosis of the IVC to the level of the renal veins with no definite opacification of the left renal vein.  Above findings discussed with Dr. Isidoro Donning at [phone].   Original Report Authenticated By: Waynard Reeds, M.D.    Dg Abd Acute W/chest  05/18/2012  *RADIOLOGY REPORT*  Clinical Data: Sepsis and abdominal pain.  ACUTE ABDOMEN SERIES (ABDOMEN 2 VIEW & CHEST 1 VIEW)  Comparison: None  Findings: The  cardiomediastinal silhouette is unremarkable. The lungs are clear. There is no of airspace disease, pleural effusion or pneumothorax.  Moderate stool in the ascending colon noted. Gas is present in the remainder of the colon and rectum. A few nondistended gas-filled loops small bowel within the left abdomen are identified. There is no evidence of bowel obstruction or pneumoperitoneum. Postoperative changes of the lower lumbar spine are present.  IMPRESSION: Nonspecific nonobstructive bowel gas pattern - no evidence of pneumoperitoneum.  No evidence of active cardiopulmonary disease.   Original Report Authenticated By: Rosendo Gros, M.D.     Review of Systems  Constitutional: Negative for fever.  Respiratory: Negative for shortness of breath.   Cardiovascular: Negative for chest pain.  Gastrointestinal: Positive for abdominal pain. Negative for nausea and vomiting.  Musculoskeletal: Positive for back pain and joint pain.  Neurological: Positive for weakness. Negative for headaches.  Psychiatric/Behavioral: The patient is nervous/anxious.     Blood pressure 97/57, pulse 101, temperature 98.7 F (37.1 C), temperature source Oral, resp. rate 20, height 5\' 5"  (1.651 m), weight 167 lb 8.8 oz (76 kg), SpO2 97.00%. Physical Exam  Constitutional: She is oriented to person, place, and time. She appears well-developed and well-nourished.  Cardiovascular: Normal rate, regular rhythm and normal heart sounds.   No murmur heard. Respiratory: Effort normal and breath sounds normal. She has no wheezes.  GI: Soft. Bowel sounds are normal. There is tenderness.  Musculoskeletal: Normal range of motion. She exhibits edema and tenderness.       Left leg less swollen; NT Rt leg swelling and redness; tender to touch  Neurological: She is alert and oriented to person, place, and time.  Skin: Skin is warm and dry.  Psychiatric: She has a normal mood and affect. Her behavior  is normal. Judgment normal.      Assessment/Plan Dr Fredia Sorrow spoke to pt and husband at length regarding procedure of low extr venous lysis to include iliac veins and IVC with poss pta/stent. They are aware of procedure benefits and risks and agreeable to proceed. Scheduled for 10/4 am Consent signed and in chart  Katheryn Culliton A 05/20/2012, 2:19 PM

## 2012-05-20 NOTE — Progress Notes (Signed)
ANTICOAGULATION CONSULT NOTE - Follow Up Consult  Pharmacy Consult for Heparin Indication: DVT  Allergies  Allergen Reactions  . Prednisone Other (See Comments)    No energy "stalls out"  . Tramadol Other (See Comments)    constipation    Patient Measurements: Heparin Dosing Weight: 73kg  Vital Signs: Temp: 98.4 F (36.9 C) (10/03 0829) Temp src: Oral (10/03 0829) BP: 106/56 mmHg (10/03 0900) Pulse Rate: 99  (10/03 0829)  Labs:  Basename 05/20/12 0800 05/20/12 0200 05/19/12 1837 05/19/12 1836 05/19/12 0824 05/18/12 1113  HGB -- 8.1* -- 8.9* -- --  HCT -- 24.2* -- 26.4* 25.1* --  PLT -- 409* -- 449* -- 599*  APTT -- -- -- -- -- --  LABPROT -- -- -- -- -- 24.2*  INR -- -- -- -- -- 2.29*  HEPARINUNFRC <0.10* <0.10* <0.10* -- -- --  CREATININE -- 0.49* -- -- 0.46* 0.68  CKTOTAL -- -- -- -- -- --  CKMB -- -- -- -- -- --  TROPONINI -- -- -- -- -- --    Estimated Creatinine Clearance: 80.1 ml/min (by C-G formula based on Cr of 0.49).   Medications:  Heparin @ 2000 units/hr  Assessment: 55yof continues on heparin with an undetectable heparin level despite aggressive rate increase this morning. No issue with infusion. On previous admission she required 2700 units/hr to become therapeutic. Extensive clot burden and need for large amounts of heparin has been complicated by anemia and need for PRBCs. Her Hgb is decreased today but fairly stable and she has had no further bleeding. EGD/colonscopy normal as well.   9/5 Antithrombin III = 81 (wnl)  Goal of Therapy:  Heparin level 0.3-0.7 units/ml Monitor platelets by anticoagulation protocol: Yes   Plan:  1) Increase heparin to 2700 units/hr 2) 6h heparin level after rate change 3) Follow up plans for additional back surgery, thrombolysis vs thrombectomy  Fredrik Rigger 05/20/2012,10:58 AM

## 2012-05-21 ENCOUNTER — Inpatient Hospital Stay (HOSPITAL_COMMUNITY): Payer: BC Managed Care – PPO

## 2012-05-21 DIAGNOSIS — K922 Gastrointestinal hemorrhage, unspecified: Secondary | ICD-10-CM

## 2012-05-21 LAB — COMPREHENSIVE METABOLIC PANEL
ALT: 18 U/L (ref 0–35)
AST: 27 U/L (ref 0–37)
Albumin: 1.4 g/dL — ABNORMAL LOW (ref 3.5–5.2)
Alkaline Phosphatase: 274 U/L — ABNORMAL HIGH (ref 39–117)
BUN: 5 mg/dL — ABNORMAL LOW (ref 6–23)
CO2: 21 mEq/L (ref 19–32)
Calcium: 8.3 mg/dL — ABNORMAL LOW (ref 8.4–10.5)
Chloride: 100 mEq/L (ref 96–112)
Creatinine, Ser: 0.47 mg/dL — ABNORMAL LOW (ref 0.50–1.10)
GFR calc Af Amer: >90 mL/min (ref >90)
GFR calc non Af Amer: >90 mL/min (ref >90)
Glucose, Bld: 98 mg/dL (ref 70–99)
Potassium: 3.7 mEq/L (ref 3.5–5.1)
Sodium: 132 mEq/L — ABNORMAL LOW (ref 135–145)
Total Bilirubin: 0.4 mg/dL (ref 0.3–1.2)
Total Protein: 5.6 g/dL — ABNORMAL LOW (ref 6.0–8.3)

## 2012-05-21 LAB — CBC
HCT: 25 % — ABNORMAL LOW (ref 36.0–46.0)
HCT: 22.8 % — ABNORMAL LOW (ref 36.0–46.0)
Hemoglobin: 8 g/dL — ABNORMAL LOW (ref 12.0–15.0)
Hemoglobin: 7.3 g/dL — ABNORMAL LOW (ref 12.0–15.0)
MCH: 26.6 pg (ref 26.0–34.0)
MCH: 26.6 pg (ref 26.0–34.0)
MCHC: 32 g/dL (ref 30.0–36.0)
MCHC: 32 g/dL (ref 30.0–36.0)
MCV: 83.1 fL (ref 78.0–100.0)
MCV: 83.2 fL (ref 78.0–100.0)
Platelets: 490 10*3/uL — ABNORMAL HIGH (ref 150–400)
Platelets: 400 10*3/uL (ref 150–400)
RBC: 3.01 MIL/uL — ABNORMAL LOW (ref 3.87–5.11)
RBC: 2.74 MIL/uL — ABNORMAL LOW (ref 3.87–5.11)
RDW: 16.2 % — ABNORMAL HIGH (ref 11.5–15.5)
RDW: 16.3 % — ABNORMAL HIGH (ref 11.5–15.5)
WBC: 13.7 10*3/uL — ABNORMAL HIGH (ref 4.0–10.5)
WBC: 12.6 10*3/uL — ABNORMAL HIGH (ref 4.0–10.5)

## 2012-05-21 LAB — HEPARIN LEVEL (UNFRACTIONATED)
Heparin Unfractionated: 0.93 IU/mL — ABNORMAL HIGH (ref 0.30–0.70)
Heparin Unfractionated: 0.88 IU/mL — ABNORMAL HIGH (ref 0.30–0.70)
Heparin Unfractionated: 0.49 [IU]/mL (ref 0.30–0.70)

## 2012-05-21 LAB — PROTIME-INR
INR: 2.35 — ABNORMAL HIGH (ref 0.00–1.49)
INR: 2.29 — ABNORMAL HIGH (ref 0.00–1.49)
INR: 2.13 — ABNORMAL HIGH (ref 0.00–1.49)
INR: 2.19 — ABNORMAL HIGH (ref 0.00–1.49)
Prothrombin Time: 24.7 seconds — ABNORMAL HIGH (ref 11.6–15.2)
Prothrombin Time: 24.2 s — ABNORMAL HIGH (ref 11.6–15.2)
Prothrombin Time: 22.9 s — ABNORMAL HIGH (ref 11.6–15.2)
Prothrombin Time: 23.4 seconds — ABNORMAL HIGH (ref 11.6–15.2)

## 2012-05-21 LAB — FIBRINOGEN: Fibrinogen: 297 mg/dL (ref 204–475)

## 2012-05-21 MED ORDER — TENECTEPLASE 50 MG IV KIT
0.1500 mg/h | PACK | INTRAVENOUS | Status: DC
  Administered 2012-05-21: 0.15 mg/h
  Administered 2012-05-22: 0.2 mg/h
  Administered 2012-05-22: 0.15 mg/h
  Filled 2012-05-21 (×2): qty 0.5

## 2012-05-21 MED ORDER — HEPARIN (PORCINE) IN NACL 100-0.45 UNIT/ML-% IJ SOLN
2000.0000 [IU]/h | INTRAMUSCULAR | Status: DC
  Administered 2012-05-21: 2200 [IU]/h via INTRAVENOUS
  Administered 2012-05-21: 2250 [IU]/h via INTRAVENOUS
  Administered 2012-05-22 (×3): 2200 [IU]/h via INTRAVENOUS
  Administered 2012-05-23: 2000 [IU]/h via INTRAVENOUS
  Filled 2012-05-21 (×7): qty 250

## 2012-05-21 MED ORDER — BUPIVACAINE-EPINEPHRINE PF 0.25-1:200000 % IJ SOLN
INTRAMUSCULAR | Status: AC
  Filled 2012-05-21: qty 30

## 2012-05-21 MED ORDER — DEXTROSE 5 % IV SOLN
1.0000 g | Freq: Three times a day (TID) | INTRAVENOUS | Status: DC
  Filled 2012-05-21 (×2): qty 1

## 2012-05-21 MED ORDER — PIPERACILLIN-TAZOBACTAM 3.375 G IVPB
3.3750 g | Freq: Three times a day (TID) | INTRAVENOUS | Status: DC
  Administered 2012-05-22 – 2012-05-23 (×6): 3.375 g via INTRAVENOUS
  Filled 2012-05-21 (×8): qty 50

## 2012-05-21 MED ORDER — TENECTEPLASE 50 MG IV KIT
0.1500 mg/h | PACK | INTRAVENOUS | Status: DC
  Administered 2012-05-21 – 2012-05-22 (×2): 0.15 mg/h
  Administered 2012-05-22: 0.2 mg/h
  Filled 2012-05-21 (×2): qty 0.5

## 2012-05-21 MED ORDER — MIDAZOLAM HCL 5 MG/5ML IJ SOLN
INTRAMUSCULAR | Status: AC | PRN
  Administered 2012-05-21 (×4): 1 mg via INTRAVENOUS

## 2012-05-21 MED ORDER — IOHEXOL 300 MG/ML  SOLN
100.0000 mL | Freq: Once | INTRAMUSCULAR | Status: AC | PRN
  Administered 2012-05-21: 25 mL via INTRAVENOUS

## 2012-05-21 MED ORDER — FENTANYL CITRATE 0.05 MG/ML IJ SOLN
INTRAMUSCULAR | Status: AC | PRN
  Administered 2012-05-21 (×2): 25 ug via INTRAVENOUS
  Administered 2012-05-21: 50 ug via INTRAVENOUS
  Administered 2012-05-21 (×2): 25 ug via INTRAVENOUS
  Administered 2012-05-21: 50 ug via INTRAVENOUS

## 2012-05-21 NOTE — Progress Notes (Signed)
ANTICOAGULATION CONSULT NOTE - Follow Up Consult  Pharmacy Consult for Heparin Indication: DVT  Allergies  Allergen Reactions  . Prednisone Other (See Comments)    No energy "stalls out"  . Tramadol Other (See Comments)    constipation    Patient Measurements: Heparin Dosing Weight: 73kg  Vital Signs: Temp: 97.6 F (36.4 C) (10/04 0500) Temp src: Oral (10/04 0500) BP: 115/67 mmHg (10/04 0500)  Labs:  Basename 05/21/12 0445 05/20/12 1707 05/20/12 0800 05/20/12 0200 05/19/12 0824 05/18/12 1113  HGB 8.0* 8.6* -- -- -- --  HCT 25.0* 25.9* -- 24.2* -- --  PLT 490* 489* -- 409* -- --  APTT -- -- -- -- -- --  LABPROT -- -- -- -- -- 24.2*  INR -- -- -- -- -- 2.29*  HEPARINUNFRC 0.93* 0.56 <0.10* -- -- --  CREATININE -- -- -- 0.49* 0.46* 0.68  CKTOTAL -- -- -- -- -- --  CKMB -- -- -- -- -- --  TROPONINI -- -- -- -- -- --    Estimated Creatinine Clearance: 80.1 ml/min (by C-G formula based on Cr of 0.49).   Medications:  Heparin @ 2700 units/hr  Assessment: 55yof continues on heparin at 2700 units/hr. Patient noted with extensive clot burden and need for high heparin rates at last admission.  Also noted that course has been complicated complicated by anemia and need for PRBCs. Heparin level (0.93) is above-goal on 2700 units/hr.   Goal of Therapy:  Heparin level 0.3-0.7 units/ml Monitor platelets by anticoagulation protocol: Yes   Plan:  1. Decrease IV heparin to 2550 units/hr. 2. Heparin level in 6 hours.   Lorre Munroe, PharmD 05/21/2012 6:02 AM

## 2012-05-21 NOTE — Progress Notes (Signed)
[facility] Progress Note [place] TEAM 1 - Stepdown/ICU TEAM   [name] [name] HYQ:[id number] DOB: October 26, 1955 DOA: 05/18/2012 PCP: Rudi Heap, MD  Brief narrative: 56 year old female with recent history of spinal surgery at [facility] in August. She was hospitalized at [facility] for left lower extremity DVT with associated bilateral pulmonary embolism. She subsequently was discharged on Lovenox and Coumadin. Patient had been taking her medication as prescribed and had been following with her outpatient physician. Two weeks ago her INR was 2.4. Three days prior to admission patient began having nausea and vomiting and was unable to keep any food down or fluids. She also began noticing bright red blood per rectum which she supposed was related to her history of hemorrhoids. She was also experiencing significant constipation. Two days prior to admission she began having pain in her right leg with associated swelling. She was also having dizziness and lightheadedness and near blackout spells with significant weakness. Because of these symptoms she presented to the emergency department. Evaluation there demonstrated a low hemoglobin of 6.9 and heme positive stool. She was also hypotensive and orthostatic and had associated tachycardia. She was also found to have a thrombocytosis with a platelet count of 599,000. She doesn't have leukocytosis with a white count greater than 20,000. During previous admission the patient presented with a hemoglobin of 6.9 and a platelet count of 592,000. On date of discharge 04/23/2012 her hemoglobin was 7.4 and her platelets were 670,000. Because of concerns with recurrent DVT and likely PE patient was admitted to the step down unit for further monitoring and treatment. It was important to note that her INR was therapeutic at presentation to the ER at 2.29 and this is quite concerning for an underlying hypercoagulable disorder.  Assessment/Plan:  Acute blood loss  anemia *Review of the laboratory data reveals that when patient presented initially in September hemoglobin was around 12 and at time of discharge hemoglobin had decreased to 7.4- suspect it is mostly related to consumption *Patient has currently received 2 units of packed red blood cells this admission and hemoglobin stable at greater than 8 *Continue to follow CBC - we'll repeat at 6 PM tonight 05/21/2012  Lower GI bleed *Appreciate gastroenterology assistance *EGD and colonoscopy completed 05/19/2012 and only revealed nonbleeding anal fissure *Patient also endorsed a history of GERD to the gastroenterologist  Iron deficiency *Likely related to chronic ongoing blood loss exacerbated by recent issues of poor nutrition. Patient and husband endorses patient has really not eaten much for at least 3 weeks *Iron was less than 10 on anemia panel this admission. May need to repeat after receipt of packed red blood cells and it remains low consider IV iron repletion  Pulmonary embolism, bilateral- moderate clot burden *This is recurrent and in the setting of therapeutic INR on Coumadin *Currently her pulmonary status is stable and she is on room air *She is also hemodynamically stable central indications at this time to pursue echocardiogram  Hypercoagulation syndrome/thrombocytosis *Very concerning since has redeveloped DVT and PE in setting of therapeutic INR on Coumadin *Because of recurrent PE an IVC filter was recommended and during insertion of this filter it was noted that this patient had extensive near occlusive thrombus extending to the IVC with nonvisualization of the left renal vein. *Appreciate hematology assistance. Full workup in process including hypercoagulable panel. *CT of the abdomen and pelvis shows no gross signs of malignancy. CEA level pending, in addition rule out for APLA or lupus anticoagulant pending-B2 glycoprotein and Cardiolite  been more unremarkable  DVT, recurrent,  lower extremity, acute- history of LLE DVT 04/2012/associated significant lower extremity edema Has underwent placement of catheters in lower extremities in preparation of TNKase * Initiation of thrombolysis delayed since patient INR is greater than 2.0. Radiology has ordered 2 units of FFP to be infused. *In order to perform the thrombolysis, the patient will require transfer to an ICU level nursing bed but we will keep on team 1 since we anticipate this will be a short-term issue and patient will subsequently be appropriate for step down after the thrombolysis infusion is complete.  Tobacco abuse *Counseled regarding cessation - an absolute must  HTN (hypertension) *Blood pressure well controlled *Home ACE inhibitor with thiazide diuretic currently on hold  History of back surgery/leukocytosis *Has been evaluated by neurosurgery this admission. Dr. [name] Dess has also reviewed the CT scan and discussed results with Dr. Sharon Seller. He mentioned abnormality regarding one of the screws in relationship to left common iliac vein is not an uncommon finding and Dr. [name] Dess does not feel that this has contributed to the patient's development of DVT *[facility] Janee M[facility] performed the initial surgery in August 2013 *Patient with leukocytosis but no fever which is likely reactive and related to extensive thrombosis. As a precaution the patient was started on broad-spectrum antibiotics. No definitive infection or abscess seen on CT abdomen and pelvis therefore antibiotics were discontinued 05/19/2012  Hyponatremia/hypokalemia *Suspect at this point primarily related to bowel prep so we'll follow electrolyte panel and when can take oral we'll replete with combination of IV and oral medication  Constipation, chronic *Likely exacerbated by recent narcotic usage and suspect has contributed to patient's GI symptomatology in regards to nausea and vomiting *Currently should be adequately  treated after bowel prep but will need to begin preventative measures once diet resumed  B12 deficiency  Begin SQ load - will need to be followed in outpt setting  DVT prophylaxis: Had IVC filter placed this admission. Currently on full dose heparin Code Status: Full Family Communication: Spoke directly with patient and her husband at the bedside Disposition Plan: Remain in step  Consultants: Hematology Pulmonology Gastroenterology Interventional radiology  Procedures: Placement of supra-renal IVC filter on 05/18/2012 interventional radiology EGD and colonoscopy by Dr. Elnoria Howard pending on 05/19/2012  Antibiotics: Maxipime 10/1 >>> 10/2  HPI/Subjective: Very sleepy and complaining of going "in and out" post initial trip to interventional radiology where she received sedation. Denies any significant pain. Denies chest pain shortness of breath appear   Objective: Blood pressure 130/73, pulse 114, temperature 98.2 F (36.8 C), temperature source Oral, resp. rate 18, height 5\' 5"  (1.651 m), weight 76 kg (167 lb 8.8 oz), SpO2 100.00%.  Intake/Output Summary (Last 24 hours) at 05/21/12 1248 Last data filed at 05/21/12 1227  Gross per 24 hour  Intake 3662.68 ml  Output   1150 ml  Net 2512.68 ml     Exam: General: No acute respiratory distress Lungs: Clear to auscultation bilaterally without wheezes or crackles Cardiovascular: Regular rate and rhythm without murmur gallop or rub normal S1 and S2, significant bilateral lower extremity edema 3+ right lower strategy and 2+ left lower extremity Abdomen: Nontender, nondistended, soft, bowel sounds positive, no rebound, no ascites, no appreciable mass Musculoskeletal: No significant cyanosis, clubbing of bilateral lower extremities Neurological: Arouses easily and oriented x3, moves all extremities x4, exam non focal  Data Reviewed: Basic Metabolic Panel:  Lab 05/21/12 7829 05/20/12 0200 05/19/12 0824 05/18/12 1113  NA  132* 134* 133*  131*  K 3.7 2.9* 3.2* 3.5  CL 100 99 96 89*  CO2 21 23 24 28   GLUCOSE 98 105* 99 116*  BUN 5* 6 7 11   CREATININE 0.47* 0.49* 0.46* 0.68  CALCIUM 8.3* 8.3* 8.4 8.9  MG -- -- -- --  PHOS -- -- -- --   Liver Function Tests:  Lab 05/21/12 0445 05/20/12 0200 05/18/12 1113  AST 27 24 14   ALT 18 14 9   ALKPHOS 274* 176* 135*  BILITOT 0.4 0.7 0.5  PROT 5.6* 5.7* 6.8  ALBUMIN 1.4* 1.5* 1.9*   CBC:  Lab 05/21/12 0445 05/20/12 1707 05/20/12 0200 05/19/12 1836 05/19/12 0824 05/18/12 1113  WBC 13.7* 14.4* 15.1* 18.9* -- 21.4*  NEUTROABS -- -- -- -- -- 18.8*  HGB 8.0* 8.6* 8.1* 8.9* 8.3* --  HCT 25.0* 25.9* 24.2* 26.4* 25.1* --  MCV 83.1 83.3 82.0 82.5 -- 82.6  PLT 490* 489* 409* 449* -- 599*   CBG: No results found for this basename: GLUCAP:5 in the last 168 hours  Recent Results (from the past 240 hour(s))  MRSA PCR SCREENING     Status: Normal   Collection Time   05/18/12  3:48 PM      Component Value Range Status Comment   MRSA by PCR NEGATIVE  NEGATIVE Final   URINE CULTURE     Status: Normal   Collection Time   05/18/12 11:24 PM      Component Value Range Status Comment   Specimen Description URINE, CLEAN CATCH   Final    Special Requests NONE   Final    Culture  Setup Time 05/19/2012 01:00   Final    Colony Count NO GROWTH   Final    Culture NO GROWTH   Final    Report Status 05/19/2012 FINAL   Final      Studies:  Recent x-ray studies have been reviewed in detail by the Attending Physician  Scheduled Meds:  Reviewed in detail by the Attending Physician   Junious Silk, ANP [facility] Office  [phone] Pager [phone]  On-Call/Text Page:      [email or web address]      password [email or web address]  If 7PM-7AM, please contact night-coverage [email or web address] Password [email or web address] 05/21/2012, 12:48 PM   LOS: 3 days    I have examined the patient, reviewed the chart and modified the above note which I agree with.   Calvert Cantor, MD [phone]

## 2012-05-21 NOTE — Progress Notes (Addendum)
ANTICOAGULATION CONSULT NOTE - Follow Up Consult  Pharmacy Consult for Heparin Indication: DVT  Assessment: 55yof continues on heparin infusion for DVT. Patient noted with extensive clot burden and need for high heparin rates at last admission.  Also noted that course has been complicated complicated by anemia and need for PRBCs. Patient is now also started on TNKase. Heparin level high-end goal range (0.49) after rate decrease earlier today, and this level is obtained ~ 4.5 hrs after rate change, hgb dropped slightly 7.3 < 8.0 after procedure, no overt bleeding noted.   Goal of Therapy:  Heparin level 0.2-0.5 units/ml Monitor platelets by anticoagulation protocol: Yes   Plan:  1. Decrease heparin rate slightly to 2200 units/hr. 2. F/u Am heparin level 3. Monitor CBC and s/sx of bleeding closely.  Bayard Hugger, PharmD, BCPS  Clinical Pharmacist  Pager: [phone]    Patient Measurements: Heparin Dosing Weight: 73kg  Vital Signs: Temp: 98.2 F (36.8 C) (10/04 1415) Temp src: Oral (10/04 1415) BP: 131/65 mmHg (10/04 1500) Pulse Rate: 114  (10/04 1500)  Labs:  Basename 05/21/12 1800 05/21/12 1606 05/21/12 1157 05/21/12 0445 05/20/12 1707 05/20/12 0200 05/19/12 0824  HGB 7.3* -- -- 8.0* -- -- --  HCT 22.8* -- -- 25.0* 25.9* -- --  PLT 400 -- -- 490* 489* -- --  APTT -- -- -- -- -- -- --  LABPROT 23.4* 22.9* 24.2* -- -- -- --  INR 2.19* 2.13* 2.29* -- -- -- --  HEPARINUNFRC 0.49 -- 0.88* 0.93* -- -- --  CREATININE -- -- -- 0.47* -- 0.49* 0.46*  CKTOTAL -- -- -- -- -- -- --  CKMB -- -- -- -- -- -- --  TROPONINI -- -- -- -- -- -- --    Estimated Creatinine Clearance: 80.1 ml/min (by C-G formula based on Cr of 0.47).     05/21/2012 7:01 PM

## 2012-05-21 NOTE — Progress Notes (Signed)
SUBJECTIVE:  Pt offers no new complaints except for on going lower extremity discomfort   MEDICATIONS:  Scheduled:   . cyanocobalamin  1,000 mcg Subcutaneous QPC supper  . pantoprazole (PROTONIX) IV  40 mg Intravenous Q12H  . white petrolatum       Continuous:   . sodium chloride 125 mL/hr at 05/21/12 0917  . heparin    . tenecteplase (TNKase) infusion    . tenecteplase (TNKase) infusion    . DISCONTD: heparin 2,550 Units/hr (05/21/12 7829)   FAO:ZHYQMVHQIONGE, acetaminophen, albuterol, alum & mag hydroxide-simeth, fentaNYL, HYDROcodone-acetaminophen, HYDROmorphone (DILAUDID) injection, iohexol, midazolam, ondansetron (ZOFRAN) IV, ondansetron   PHYSICAL EXAM  Vital signs in last 24 hours: Temp:  [97.6 F (36.4 C)-98.8 F (37.1 C)] 98.2 F (36.8 C) (10/04 1415) Pulse Rate:  [96-117] 114  (10/04 1227) Resp:  [17-25] 18  (10/04 1227) BP: (103-162)/(51-91) 144/81 mmHg (10/04 1415) SpO2:  [94 %-100 %] 100 % (10/04 1019)  Intake/Output from previous day: 10/03 0701 - 10/04 0700 In: 4563.7 [P.O.:1080; I.V.:3473.7; IV Piggyback:10] Out: 2650 [Urine:2650] Intake/Output this shift: Total I/O In: 661 [Blood:661] Out: -    General appearance: alert and cooperative  Resp: clear to auscultation bilaterally and normal percussion bilaterally  Cardio: regular rate and rhythm, S1, S2 normal, no murmur, click, rub or gallop  GI: soft, non-tender; bowel sounds normal; no masses, no organomegaly  Extremities: edema bilaterally to thighs  Skin - no petechiae   LABS:  CBC    Component Value Date/Time   WBC 13.7* 05/21/2012 0445   RBC 3.01* 05/21/2012 0445   HGB 8.0* 05/21/2012 0445   HCT 25.0* 05/21/2012 0445   PLT 490* 05/21/2012 0445   MCV 83.1 05/21/2012 0445   MCH 26.6 05/21/2012 0445   MCHC 32.0 05/21/2012 0445   RDW 16.2* 05/21/2012 0445   LYMPHSABS 1.2 05/18/2012 1113   MONOABS 1.4* 05/18/2012 1113   EOSABS 0.0 05/18/2012 1113   BASOSABS 0.0 05/18/2012 1113     Basename  05/21/12 0445 05/20/12 0200  NA 132* 134*  K 3.7 2.9*  CL 100 99  CO2 21 23  GLUCOSE 98 105*  BUN 5* 6  CREATININE 0.47* 0.49*  CALCIUM 8.3* 8.3*     ASSESSMENT and PLAN: 1. ensive thrombosis. S/P IVC filter. For transcatheter bilateral venous thrombolysis with probably angioplasty/stent placement. Hypercoagulable profile negative so far.   2.  Anemia - For 2 units of PRBC  Will continue to follow.   Arlan Organ I., MD 05/21/2012

## 2012-05-21 NOTE — Progress Notes (Signed)
Patient has streptokinase drip infusing at 15cc/hr (.15 mg/hr) in right and left lower extremities as ordered.  However, information that both drips are infusing does not cross over to doc/flowsheet for intake and output.  Pharmacy notified of issue, unable to revolve at this time.  2205:  Updated issue about I & O for streptokinase infusions with [name] [name] in Pharmacy.  No resolution to problem at this time.  Continue to monitor.

## 2012-05-21 NOTE — Procedures (Signed)
B LE and Iliac DVT lysis No comp

## 2012-05-21 NOTE — Progress Notes (Signed)
PULMONARY/CCM CONSULT NOTE  Requesting MD/Service: TRH Date of admission:  10/01 Date of consult: 10/01 Reason for consultation: recent DVT/PE. Acute LGIB  Pt Profile:  56 year old female with spinal fusion surgery in [facility] on August 19th, subsequently had left leg DVT with bilateral pulmonary embolism in first week of September and was discharged on Lovenox and Coumadin on 04/23/2012. Admitted 10/01 to [facility] service with hematochezia, light headedness, new onset RLE edema, anemia (Hgb 6.9) and mild hypotension. PCCM asked to assist in eval and mgmt. Her original DVT was in LLE. Venous duplex study on day of this admission revealed new extensive DVT in RLE. She indicates that she has been therapeutic or supra-therapeutic on all pro-time checks since discharge from hospital 9/6  HPI: as above. She has had no respiratory symptoms, specifically no CP, pleurodynia, DOE, hemoptysis or purulent sputum. She is alredy scheduled for IVC filter placement. GI consultation has been requested    Past Medical History  Diagnosis Date  . DVT (deep venous thrombosis)   . Pulmonary embolism   . Back pain     MEDICATIONS:   History   Social History  . Marital Status: Married    Spouse Name: N/A    Number of Children: N/A  . Years of Education: N/A   Occupational History  . Not on file.   Social History Main Topics  . Smoking status: Current Every Day Smoker  . Smokeless tobacco: Not on file  . Alcohol Use: Yes     occ  . Drug Use: No  . Sexually Active:    Other Topics Concern  . Not on file   Social History Narrative  . No narrative on file    Family History  Problem Relation Age of Onset  . Leukemia Mother   . Prostate cancer Father     ROS - Denies unexplained wt loss. No breast lumps. ROS entirely negative except as per HPI  Filed Vitals:   05/21/12 1003 05/21/12 1007 05/21/12 1010 05/21/12 1019  BP: 157/91 162/90 155/78 152/87  Pulse: 99 102 101 103  Temp:        TempSrc:      Resp: 22 25 23 22   Height:      Weight:      SpO2: 100% 100% 100% 100%    EXAM:  Gen: NAD HEENT: WNL Neck: No JVD Lungs: clear to ausc and perc Cardiovascular: tachy, regular, no murmurs Abdomen: soft, NT, NABS Musculoskeletal: warm, 2+ pitting edema symmetrically Neuro: intact   DATA:   BMET    Component Value Date/Time   NA 132* 05/21/2012 0445   K 3.7 05/21/2012 0445   CL 100 05/21/2012 0445   CO2 21 05/21/2012 0445   GLUCOSE 98 05/21/2012 0445   BUN 5* 05/21/2012 0445   CREATININE 0.47* 05/21/2012 0445   CALCIUM 8.3* 05/21/2012 0445   GFRNONAA >90 05/21/2012 0445   GFRAA >90 05/21/2012 0445    CBC    Component Value Date/Time   WBC 13.7* 05/21/2012 0445   RBC 3.01* 05/21/2012 0445   HGB 8.0* 05/21/2012 0445   HCT 25.0* 05/21/2012 0445   PLT 490* 05/21/2012 0445   MCV 83.1 05/21/2012 0445   MCH 26.6 05/21/2012 0445   MCHC 32.0 05/21/2012 0445   RDW 16.2* 05/21/2012 0445   LYMPHSABS 1.2 05/18/2012 1113   MONOABS 1.4* 05/18/2012 1113   EOSABS 0.0 05/18/2012 1113   BASOSABS 0.0 05/18/2012 1113    CXR: NACPD EKG: ST, IRBBB, no acute  ischemia, no S1Q3T3 RLE venous duplex (10/01): preliminary report > extensive RLE DVT  IMPRESSION:   1) Recent DVT/PE after back surgery 2) New RLE DVT despite therapeutic anticoagulation 3) Acute blood loss anemia with BRBPR  DISCUSSION: This is a very challenging case. The most troubling aspect of it is that she has had new and extensive DVT develop despite what sounds like therapeutic anticoagulation. Although she has never had previous thrombotic events, one would have to be concerned for a hypercoagulable state. These would include the usual inborn thrombophilic states as well as occult malignancies. She has never had a colonoscopy or mammography. Ultimately, even with IVC filter placement, it would be desirable to resume anticoagulation as soon as can be safely done to prevent long term sequelae of bilateral DVTs (post-phlebitic  syndrome) specially given that the IVC filter will be a temporary measure.  PLAN/REC:  1) Agree with IVC filter placement, retrievable if possible. 2) As she does not seem to be briskly bleeding, will not give FFP or vitamin K until we have a better idea of what the LGIB process is. 3) A hypercoagulation panel should be sent once she is no longer affected by warfarin and acute thrombosis and H/O consult is certainly appreciated. 4) GI consult noted. 5) She should undergo mammography and full colonoscopy as the recommended screening procedures for occult malignancy as a cause for her to suddenly started forming now.  Little for PCCM to do at this point, will likely need life long anticoagulation once GI bleed is under control.  Note that the IVC filter will likely be only effective for 6 months.  Hypercoagulable work up per H/O.  Alyson Reedy, M.D. [facility] Pulmonary/Critical Care Medicine. Pager: [phone]. After hours pager: [phone].

## 2012-05-21 NOTE — Progress Notes (Signed)
ANTICOAGULATION CONSULT NOTE - Follow Up Consult  Pharmacy Consult for Heparin Indication: DVT  Assessment: 55yof continues on heparin at 2550 units/hr. Patient noted with extensive clot burden and need for high heparin rates at last admission.  Also noted that course has been complicated complicated by anemia and need for PRBCs. Heparin level now (0.88) is above-goal on 2550 units/hr. Noted that patient is now back from IR and orders to start [facility] which will lower heparin goal. No overt bleeding noted. TNKase to start after 2 units of FFP are given.  Goal of Therapy:  Heparin level 0.2-0.5 units/ml Monitor platelets by anticoagulation protocol: Yes   Plan:  1. Decrease IV heparin to 2250 units/hr. 2. Heparin level in 6 hours.  3. CBC and heparin level at 1800 this afternoon  Patient Measurements: Heparin Dosing Weight: 73kg  Vital Signs: Temp: 98.2 F (36.8 C) (10/04 1227) Temp src: Oral (10/04 1227) BP: 130/73 mmHg (10/04 1227) Pulse Rate: 114  (10/04 1227)  Labs:  Basename 05/21/12 1157 05/21/12 0946 05/21/12 0445 05/20/12 1707 05/20/12 0200 05/19/12 0824  HGB -- -- 8.0* 8.6* -- --  HCT -- -- 25.0* 25.9* 24.2* --  PLT -- -- 490* 489* 409* --  APTT -- -- -- -- -- --  LABPROT 24.2* 24.7* -- -- -- --  INR 2.29* 2.35* -- -- -- --  HEPARINUNFRC 0.88* -- 0.93* 0.56 -- --  CREATININE -- -- 0.47* -- 0.49* 0.46*  CKTOTAL -- -- -- -- -- --  CKMB -- -- -- -- -- --  TROPONINI -- -- -- -- -- --    Estimated Creatinine Clearance: 80.1 ml/min (by C-G formula based on Cr of 0.47).   Medications:  Heparin @ 2550   Lorre Munroe, PharmD 05/21/2012 1:20 PM

## 2012-05-21 NOTE — Progress Notes (Signed)
Daphane Shepherd NP notified of temperature of 101.3 [place].  Orders rec'd.

## 2012-05-22 ENCOUNTER — Inpatient Hospital Stay (HOSPITAL_COMMUNITY): Payer: BC Managed Care – PPO

## 2012-05-22 DIAGNOSIS — R509 Fever, unspecified: Secondary | ICD-10-CM | POA: Diagnosis present

## 2012-05-22 LAB — CBC
HCT: 24.5 % — ABNORMAL LOW (ref 36.0–46.0)
HCT: 24.7 % — ABNORMAL LOW (ref 36.0–46.0)
Hemoglobin: 7.9 g/dL — ABNORMAL LOW (ref 12.0–15.0)
Hemoglobin: 8 g/dL — ABNORMAL LOW (ref 12.0–15.0)
MCH: 27.1 pg (ref 26.0–34.0)
MCH: 27.1 pg (ref 26.0–34.0)
MCHC: 32.2 g/dL (ref 30.0–36.0)
MCHC: 32.4 g/dL (ref 30.0–36.0)
MCV: 84.2 fL (ref 78.0–100.0)
MCV: 83.7 fL (ref 78.0–100.0)
Platelets: 426 10*3/uL — ABNORMAL HIGH (ref 150–400)
Platelets: 374 10*3/uL (ref 150–400)
RBC: 2.91 MIL/uL — ABNORMAL LOW (ref 3.87–5.11)
RBC: 2.95 MIL/uL — ABNORMAL LOW (ref 3.87–5.11)
RDW: 16.3 % — ABNORMAL HIGH (ref 11.5–15.5)
RDW: 16.4 % — ABNORMAL HIGH (ref 11.5–15.5)
WBC: 10.5 10*3/uL (ref 4.0–10.5)
WBC: 10.8 10*3/uL — ABNORMAL HIGH (ref 4.0–10.5)

## 2012-05-22 LAB — URINALYSIS, ROUTINE W REFLEX MICROSCOPIC
Bilirubin Urine: NEGATIVE
Glucose, UA: NEGATIVE mg/dL
Ketones, ur: NEGATIVE mg/dL
Nitrite: NEGATIVE
Protein, ur: NEGATIVE mg/dL
Specific Gravity, Urine: 1.007 (ref 1.005–1.030)
Urobilinogen, UA: 0.2 mg/dL (ref 0.0–1.0)
pH: 6 (ref 5.0–8.0)

## 2012-05-22 LAB — PREPARE FRESH FROZEN PLASMA
Unit division: 0
Unit division: 0

## 2012-05-22 LAB — FIBRINOGEN
Fibrinogen: 317 mg/dL (ref 204–475)
Fibrinogen: 236 mg/dL (ref 204–475)
Fibrinogen: 195 mg/dL — ABNORMAL LOW (ref 204–475)

## 2012-05-22 LAB — URINE MICROSCOPIC-ADD ON

## 2012-05-22 LAB — HEPARIN LEVEL (UNFRACTIONATED)
Heparin Unfractionated: 0.24 IU/mL — ABNORMAL LOW (ref 0.30–0.70)
Heparin Unfractionated: 0.27 IU/mL — ABNORMAL LOW (ref 0.30–0.70)

## 2012-05-22 MED ORDER — SODIUM CHLORIDE 0.9 % IJ SOLN
3.0000 mL | Freq: Two times a day (BID) | INTRAMUSCULAR | Status: DC
  Administered 2012-05-23 – 2012-05-27 (×9): 3 mL via INTRAVENOUS

## 2012-05-22 MED ORDER — SODIUM CHLORIDE 0.9 % IJ SOLN: 3.0000 mL | INTRAMUSCULAR | Status: DC | PRN

## 2012-05-22 MED ORDER — HEPARIN (PORCINE) IN NACL 100-0.45 UNIT/ML-% IJ SOLN: 800.0000 [IU]/h | INTRAMUSCULAR | Status: DC

## 2012-05-22 MED ORDER — MIDAZOLAM HCL 2 MG/2ML IJ SOLN: 1.0000 mg | INTRAMUSCULAR | Status: DC | PRN

## 2012-05-22 MED ORDER — ONDANSETRON HCL 4 MG/2ML IJ SOLN: 4.0000 mg | Freq: Four times a day (QID) | INTRAMUSCULAR | Status: DC | PRN

## 2012-05-22 MED ORDER — TENECTEPLASE 50 MG IV KIT
0.2000 mg/h | PACK | INTRAVENOUS | Status: AC
  Filled 2012-05-22 (×3): qty 0.5

## 2012-05-22 MED ORDER — TENECTEPLASE 50 MG IV KIT
0.2000 mg/h | PACK | INTRAVENOUS | Status: AC
  Administered 2012-05-23 (×2): 0.2 mg/h
  Filled 2012-05-22 (×3): qty 0.5

## 2012-05-22 MED ORDER — SODIUM CHLORIDE 0.9 % IV SOLN
INTRAVENOUS | Status: DC
  Administered 2012-05-22 (×2): via INTRAVENOUS

## 2012-05-22 MED ORDER — IOHEXOL 300 MG/ML  SOLN
50.0000 mL | Freq: Once | INTRAMUSCULAR | Status: AC | PRN
  Administered 2012-05-22: 20 mL via INTRAVENOUS

## 2012-05-22 MED ORDER — BIOTENE DRY MOUTH MT LIQD
15.0000 mL | Freq: Two times a day (BID) | OROMUCOSAL | Status: DC
  Administered 2012-05-22 – 2012-05-27 (×11): 15 mL via OROMUCOSAL

## 2012-05-22 MED ORDER — SODIUM CHLORIDE 0.9 % IV SOLN
INTRAVENOUS | Status: DC
  Administered 2012-05-22: 13:00:00 via INTRAVENOUS

## 2012-05-22 MED ORDER — SODIUM CHLORIDE 0.9 % IV SOLN: 250.0000 mL | INTRAVENOUS | Status: DC | PRN

## 2012-05-22 NOTE — Progress Notes (Signed)
ANTICOAGULATION CONSULT NOTE - Follow Up Consult  Pharmacy Consult for Heparin Indication: DVT  Assessment: 55yof continues on heparin at 2200 units/hr. Patient noted with extensive clot burden and need for high heparin rates at last admission.  Also noted that course has been complicated by anemia and need for PRBCs. Heparin level now (0.24) now at goal being used in combination with TNKase. No bleeding issues noted per nursing. Plans are to return to IR today for re-examination.  TNKase infusing in R/L popliteal for a total of 0.3mg /hr.  Goal of Therapy:  Heparin level 0.2-0.5 units/ml Monitor platelets by anticoagulation protocol: Yes   Plan:  1. Continue IV heparin at 2200 units/hr. 2. Heparin level daily 3. Follow plans after IR today.  Patient Measurements: Heparin Dosing Weight: 73kg  Vital Signs: Temp: 97.6 F (36.4 C) (10/05 0800) Temp src: Oral (10/05 0800) BP: 101/61 mmHg (10/05 0800) Pulse Rate: 94  (10/05 0800)  Labs:  Basename 05/22/12 0500 05/21/12 1800 05/21/12 1606 05/21/12 1157 05/21/12 0445 05/20/12 0200  HGB 7.9* 7.3* -- -- -- --  HCT 24.5* 22.8* -- -- 25.0* --  PLT 426* 400 -- -- 490* --  APTT -- -- -- -- -- --  LABPROT -- 23.4* 22.9* 24.2* -- --  INR -- 2.19* 2.13* 2.29* -- --  HEPARINUNFRC 0.24* 0.49 -- 0.88* -- --  CREATININE -- -- -- -- 0.47* 0.49*  CKTOTAL -- -- -- -- -- --  CKMB -- -- -- -- -- --  TROPONINI -- -- -- -- -- --    Estimated Creatinine Clearance: 80.1 ml/min (by C-G formula based on Cr of 0.47).   Medications:  Heparin @ 2200   Denny Peon, PharmD 05/22/2012 8:58 AM

## 2012-05-22 NOTE — Progress Notes (Addendum)
Left popliteal catheter appears to be leaking clear fluid.  Connections tightened.  Still appears to be leaking.  Dr. Fredia Sorrow notified.  Will continue to monitor.

## 2012-05-22 NOTE — ED Provider Notes (Signed)
Medical screening examination/treatment/procedure(s) were performed by non-physician practitioner and as supervising physician I was immediately available for consultation/collaboration.  Jones Skene, M.D.     Jones Skene, MD 05/22/12 9604

## 2012-05-22 NOTE — Progress Notes (Signed)
ANTICOAGULATION CONSULT NOTE - Follow Up Consult  Pharmacy Consult for Heparin Indication: DVT  Assessment: 55yof continues on heparin at 2200 units/hr. Patient noted with extensive clot burden and need for high heparin rates at last admission.  Also noted that course has been complicated by anemia and need for PRBCs. Heparin level now (0.24) now at goal being used in combination with TNKase. No bleeding issues noted per nursing. Very little progress on clot burden per IR.   D/w Dr.[facility] pharmacy will continue to follow heparin dosing and keep level at lower end of goal ~0.2. 6 hour heparin levels ordered by yamagata, will continue to follow closely.  TNKase infusing in R/L popliteal for a total of 0.4mg /hr(increased).  Goal of Therapy:  Heparin level ~0.2 units/ml Monitor platelets by anticoagulation protocol: Yes   Plan:  1. Continue IV heparin at 2200 units/hr. 2. Heparin level q6h   Patient Measurements: Heparin Dosing Weight: 73kg  Vital Signs: Temp: 97.6 F (36.4 C) (10/05 0800) Temp src: Oral (10/05 0800) BP: 129/65 mmHg (10/05 1233) Pulse Rate: 106  (10/05 1233)  Labs:  Basename 05/22/12 0500 05/21/12 1800 05/21/12 1606 05/21/12 1157 05/21/12 0445 05/20/12 0200  HGB 7.9* 7.3* -- -- -- --  HCT 24.5* 22.8* -- -- 25.0* --  PLT 426* 400 -- -- 490* --  APTT -- -- -- -- -- --  LABPROT -- 23.4* 22.9* 24.2* -- --  INR -- 2.19* 2.13* 2.29* -- --  HEPARINUNFRC 0.24* 0.49 -- 0.88* -- --  CREATININE -- -- -- -- 0.47* 0.49*  CKTOTAL -- -- -- -- -- --  CKMB -- -- -- -- -- --  TROPONINI -- -- -- -- -- --    Estimated Creatinine Clearance: 80.1 ml/min (by C-G formula based on Cr of 0.47).   Medications:  Heparin @ 2200   Denny Peon, PharmD 05/22/2012 1:18 PM

## 2012-05-22 NOTE — Progress Notes (Signed)
Dr. Rito Ehrlich notified peripheral iv out of date.  Per Dr. Rito Ehrlich ok to still use iv since iv team unable to place new peripheral yesterday and pt receiving TNKase.  No signs of infection.  Will continue to monitor.

## 2012-05-22 NOTE — Procedures (Signed)
Procedure:  Follow up angiography on thrombolytic therapy Findings:  Improved patency of upper IVC.  Considerable lower IVC, bilateral iliac and bilateral femoral DVT remains. Plan:  Infusion catheters adjusted in position.  Will increase lytic dose to 0.2 mg/hr bilat for total dose of 0.4 mg/hr.  Recheck angio tomorrow.

## 2012-05-22 NOTE — Progress Notes (Signed)
[facility]  Daily Progress Note  Assessment/Planning: POD #1 s/p B iliofemoropopliteal mech. Thrombectomy, placement of lytic catheters   No evidence of phlegmasia  Continued mgmt per IR  Available as needed. Dr. Myra Gianotti will check on pt on Monday  Subjective   Legs about the small  Objective Filed Vitals:   05/22/12 0553 05/22/12 0600 05/22/12 0700 05/22/12 0800  BP:  114/66 117/62 101/61  Pulse: 89 89 90 94  Temp:    97.6 F (36.4 C)  TempSrc:    Oral  Resp: 13 12 12 21   Height:      Weight:      SpO2: 100% 100% 100% 100%    Intake/Output Summary (Last 24 hours) at 05/22/12 0952 Last data filed at 05/22/12 0800  Gross per 24 hour  Intake   3540 ml  Output   2100 ml  Net   1440 ml    PULM  CTAB CV  RRR GI  soft, NTND VASC  BLE edematous 2+, warm feet, well perfused, lytic catheters in place  Laboratory CBC    Component Value Date/Time   WBC 10.5 05/22/2012 0500   HGB 7.9* 05/22/2012 0500   HCT 24.5* 05/22/2012 0500   PLT 426* 05/22/2012 0500    BMET    Component Value Date/Time   NA 132* 05/21/2012 0445   K 3.7 05/21/2012 0445   CL 100 05/21/2012 0445   CO2 21 05/21/2012 0445   GLUCOSE 98 05/21/2012 0445   BUN 5* 05/21/2012 0445   CREATININE 0.47* 05/21/2012 0445   CALCIUM 8.3* 05/21/2012 0445   GFRNONAA >90 05/21/2012 0445   GFRAA >90 05/21/2012 0445   Fibrinogen: 317  Leonides Sake, MD [facility] Office: [phone] Pager: [phone]  05/22/2012, 9:52 AM

## 2012-05-22 NOTE — Progress Notes (Signed)
ANTICOAGULATION CONSULT NOTE - Follow Up Consult  Pharmacy Consult for Heparin Indication: DVT  Assessment: 55yof continues on heparin at 2200 units/hr. Patient noted with extensive clot burden and need for high heparin rates at last admission.  Also noted that course has been complicated by anemia and need for PRBCs. Heparin level (0.27) is therapeutic. Per MD, target low end of goal range (~0.2). If heparin level continues to trend up, will need to reduce heparin rate. No significant bleeding issues noted per nursing. Very little progress on clot burden per IR.   TNKase infusing in R/L popliteal for a total of 0.4mg /hr.  Goal of Therapy:  Heparin level ~0.2 units/ml Monitor platelets by anticoagulation protocol: Yes   Plan:  1. Continue IV heparin at 2200 units/hr 2. Heparin level q6h   Patient Measurements: Heparin Dosing Weight: 73kg  Vital Signs: Temp: 97.6 F (36.4 C) (10/05 0800) Temp src: Oral (10/05 0800) BP: 128/65 mmHg (10/05 1900) Pulse Rate: 99  (10/05 1900)  Labs:  Basename 05/22/12 1800 05/22/12 0500 05/21/12 1800 05/21/12 1606 05/21/12 1157 05/21/12 0445 05/20/12 0200  HGB 8.0* 7.9* -- -- -- -- --  HCT 24.7* 24.5* 22.8* -- -- -- --  PLT 374 426* 400 -- -- -- --  APTT -- -- -- -- -- -- --  LABPROT -- -- 23.4* 22.9* 24.2* -- --  INR -- -- 2.19* 2.13* 2.29* -- --  HEPARINUNFRC 0.27* 0.24* 0.49 -- -- -- --  CREATININE -- -- -- -- -- 0.47* 0.49*  CKTOTAL -- -- -- -- -- -- --  CKMB -- -- -- -- -- -- --  TROPONINI -- -- -- -- -- -- --    Estimated Creatinine Clearance: 80.1 ml/min (by C-G formula based on Cr of 0.47).   Medications:  Heparin @ 2200 units/hr   Cleon Dew, PharmD [phone] 05/22/2012 7:49 PM

## 2012-05-22 NOTE — Progress Notes (Signed)
3 Days Post-Op  Subjective: Bilat LE ; iliac veins and IVC thrombolysis started 10/4 Pt feels good this am; good spirits No complaints  Objective: Vital signs in last 24 hours: Temp:  [97.6 F (36.4 C)-101.3 F (38.5 C)] 97.6 F (36.4 C) (10/05 0800) Pulse Rate:  [87-124] 94  (10/05 0800) Resp:  [12-26] 21  (10/05 0800) BP: (101-162)/(58-91) 101/61 mmHg (10/05 0800) SpO2:  [96 %-100 %] 100 % (10/05 0800) Last BM Date: 05/20/12  Intake/Output from previous day: June 03, 2023 0701 - 10/05 0700 In: 3751.5 [P.O.:360; I.V.:2486; Blood:661; IV Piggyback:244.5] Out: 1600 [Urine:1600] Intake/Output this shift: Total I/O In: 89.5 [I.V.:62; IV Piggyback:27.5] Out: 500 [Urine:500]  PE:  VSS; afeb Fibrinogen 317; H/H stable Leg catheters intact; no bleeding No hematoma; NT Clean and dry Rt leg appears smaller today; = in size to left Skin warm, pink  Lab Results:   Basename 05/22/12 0500 06-02-2012 1800  WBC 10.5 12.6*  HGB 7.9* 7.3*  HCT 24.5* 22.8*  PLT 426* 400   BMET  Basename 2012-06-02 0445 05/20/12 0200  NA 132* 134*  K 3.7 2.9*  CL 100 99  CO2 21 23  GLUCOSE 98 105*  BUN 5* 6  CREATININE 0.47* 0.49*  CALCIUM 8.3* 8.3*   PT/INR  Basename 2012/06/02 1800 Jun 02, 2012 1606  LABPROT 23.4* 22.9*  INR 2.19* 2.13*   ABG No results found for this basename: PHART:2,PCO2:2,PO2:2,HCO3:2 in the last 72 hours  Studies/Results: Ir Veno/ext/bi  2012/06/02  *RADIOLOGY REPORT*  Clinical Data/Indication:  IR ULTRASOUND GUIDANCE VASC ACCESS LEFT,IR ULTRASOUND GUIDANCE VASC ACCESS RIGHT,BILATERAL EXTREMITY VENOGRAPHY,THROMBECTOMY MECHANICAL  Sedation: Versed 4 mg, Fentanyl 200 mg.  Total Moderate Sedation Time: 74 minutes.  Contrast Volume: 50 ml Omnipaque-300.  Fluoroscopy Time: 23.8 minutes.  Procedure: The procedure, risks, benefits, and alternatives were explained to the patient. Questions regarding the procedure were encouraged and answered. The patient understands and consents to the  procedure.  The bilateral popliteal fossas were prepped with betadine in a sterile fashion, and a sterile drape was applied covering the operative field. A sterile gown and sterile gloves were used for the procedure.  Under sonographic guidance, a micropuncture needle was inserted into the left popliteal vein and removed over a 0018 wire which was upsized to a [employer].  A 7-French sheath was inserted.  The identical procedure was performed in the right popliteal fossa.  A vertebral catheter was then advanced over a Roadrunner wire across the thrombus within the left popliteal and femoral venous system into the IVC and to the right atrium.  This was removed over a long Amplatz wire.  The identical procedure was performed for the right lower extremity.  Access to the right atrium was achieved. Contrast was injected into both sheaths for lower extremity venography.  Angiojet device was then utilized to lyse the thrombus in both lower extremities and both iliac venous system for a total of 150 seconds.  The patient did experience to vagal episodes and therefore the Angiojet device was ceased at 150 seconds.  Infusion catheters were then advanced over the Amplatz wires bilaterally from the popliteal fossas to the level of the IVC filter. They were secured in place. The sheaths were sewn in place with 0-silk.  Findings: Bilateral lower extremity venography confirms thrombosis of both popliteal and femoral venous systems.  Complications: None.  IMPRESSION: Bilateral lower extremity venous thrombosis is demonstrated with venography.  Angiojet device was utilized for clot lysis.  Infusion catheters were positioned to the level of the IVC  filter for subsequent thrombolytic.   Original Report Authenticated By: Donavan Burnet, M.D.    Ir Thrombect Prim Mech Init (inclu) Mod Sed  05/21/2012  *RADIOLOGY REPORT*  Clinical Data/Indication:  IR ULTRASOUND GUIDANCE VASC ACCESS LEFT,IR ULTRASOUND GUIDANCE VASC ACCESS RIGHT,BILATERAL  EXTREMITY VENOGRAPHY,THROMBECTOMY MECHANICAL  Sedation: Versed 4 mg, Fentanyl 200 mg.  Total Moderate Sedation Time: 74 minutes.  Contrast Volume: 50 ml Omnipaque-300.  Fluoroscopy Time: 23.8 minutes.  Procedure: The procedure, risks, benefits, and alternatives were explained to the patient. Questions regarding the procedure were encouraged and answered. The patient understands and consents to the procedure.  The bilateral popliteal fossas were prepped with betadine in a sterile fashion, and a sterile drape was applied covering the operative field. A sterile gown and sterile gloves were used for the procedure.  Under sonographic guidance, a micropuncture needle was inserted into the left popliteal vein and removed over a 0018 wire which was upsized to a [employer].  A 7-French sheath was inserted.  The identical procedure was performed in the right popliteal fossa.  A vertebral catheter was then advanced over a Roadrunner wire across the thrombus within the left popliteal and femoral venous system into the IVC and to the right atrium.  This was removed over a long Amplatz wire.  The identical procedure was performed for the right lower extremity.  Access to the right atrium was achieved. Contrast was injected into both sheaths for lower extremity venography.  Angiojet device was then utilized to lyse the thrombus in both lower extremities and both iliac venous system for a total of 150 seconds.  The patient did experience to vagal episodes and therefore the Angiojet device was ceased at 150 seconds.  Infusion catheters were then advanced over the Amplatz wires bilaterally from the popliteal fossas to the level of the IVC filter. They were secured in place. The sheaths were sewn in place with 0-silk.  Findings: Bilateral lower extremity venography confirms thrombosis of both popliteal and femoral venous systems.  Complications: None.  IMPRESSION: Bilateral lower extremity venous thrombosis is demonstrated with  venography.  Angiojet device was utilized for clot lysis.  Infusion catheters were positioned to the level of the IVC filter for subsequent thrombolytic.   Original Report Authenticated By: Donavan Burnet, M.D.    Ir Thrombect Prim Mech Init (inclu) Mod Sed  05/21/2012  *RADIOLOGY REPORT*  Clinical Data/Indication:  IR ULTRASOUND GUIDANCE VASC ACCESS LEFT,IR ULTRASOUND GUIDANCE VASC ACCESS RIGHT,BILATERAL EXTREMITY VENOGRAPHY,THROMBECTOMY MECHANICAL  Sedation: Versed 4 mg, Fentanyl 200 mg.  Total Moderate Sedation Time: 74 minutes.  Contrast Volume: 50 ml Omnipaque-300.  Fluoroscopy Time: 23.8 minutes.  Procedure: The procedure, risks, benefits, and alternatives were explained to the patient. Questions regarding the procedure were encouraged and answered. The patient understands and consents to the procedure.  The bilateral popliteal fossas were prepped with betadine in a sterile fashion, and a sterile drape was applied covering the operative field. A sterile gown and sterile gloves were used for the procedure.  Under sonographic guidance, a micropuncture needle was inserted into the left popliteal vein and removed over a 0018 wire which was upsized to a [employer].  A 7-French sheath was inserted.  The identical procedure was performed in the right popliteal fossa.  A vertebral catheter was then advanced over a Roadrunner wire across the thrombus within the left popliteal and femoral venous system into the IVC and to the right atrium.  This was removed over a long Amplatz wire.  The identical procedure was  performed for the right lower extremity.  Access to the right atrium was achieved. Contrast was injected into both sheaths for lower extremity venography.  Angiojet device was then utilized to lyse the thrombus in both lower extremities and both iliac venous system for a total of 150 seconds.  The patient did experience to vagal episodes and therefore the Angiojet device was ceased at 150 seconds.  Infusion  catheters were then advanced over the Amplatz wires bilaterally from the popliteal fossas to the level of the IVC filter. They were secured in place. The sheaths were sewn in place with 0-silk.  Findings: Bilateral lower extremity venography confirms thrombosis of both popliteal and femoral venous systems.  Complications: None.  IMPRESSION: Bilateral lower extremity venous thrombosis is demonstrated with venography.  Angiojet device was utilized for clot lysis.  Infusion catheters were positioned to the level of the IVC filter for subsequent thrombolytic.   Original Report Authenticated By: Donavan Burnet, M.D.    Ir US Guide Vasc Access Left  05/21/2012  *RADIOLOGY REPORT*  Clinical Data/Indication:  IR ULTRASOUND GUIDANCE VASC ACCESS LEFT,IR ULTRASOUND GUIDANCE VASC ACCESS RIGHT,BILATERAL EXTREMITY VENOGRAPHY,THROMBECTOMY MECHANICAL  Sedation: Versed 4 mg, Fentanyl 200 mg.  Total Moderate Sedation Time: 74 minutes.  Contrast Volume: 50 ml Omnipaque-300.  Fluoroscopy Time: 23.8 minutes.  Procedure: The procedure, risks, benefits, and alternatives were explained to the patient. Questions regarding the procedure were encouraged and answered. The patient understands and consents to the procedure.  The bilateral popliteal fossas were prepped with betadine in a sterile fashion, and a sterile drape was applied covering the operative field. A sterile gown and sterile gloves were used for the procedure.  Under sonographic guidance, a micropuncture needle was inserted into the left popliteal vein and removed over a 0018 wire which was upsized to a [employer].  A 7-French sheath was inserted.  The identical procedure was performed in the right popliteal fossa.  A vertebral catheter was then advanced over a Roadrunner wire across the thrombus within the left popliteal and femoral venous system into the IVC and to the right atrium.  This was removed over a long Amplatz wire.  The identical procedure was performed for the right  lower extremity.  Access to the right atrium was achieved. Contrast was injected into both sheaths for lower extremity venography.  Angiojet device was then utilized to lyse the thrombus in both lower extremities and both iliac venous system for a total of 150 seconds.  The patient did experience to vagal episodes and therefore the Angiojet device was ceased at 150 seconds.  Infusion catheters were then advanced over the Amplatz wires bilaterally from the popliteal fossas to the level of the IVC filter. They were secured in place. The sheaths were sewn in place with 0-silk.  Findings: Bilateral lower extremity venography confirms thrombosis of both popliteal and femoral venous systems.  Complications: None.  IMPRESSION: Bilateral lower extremity venous thrombosis is demonstrated with venography.  Angiojet device was utilized for clot lysis.  Infusion catheters were positioned to the level of the IVC filter for subsequent thrombolytic.   Original Report Authenticated By: Donavan Burnet, M.D.    Ir US Guide Vasc Access Right  05/21/2012  *RADIOLOGY REPORT*  Clinical Data/Indication:  IR ULTRASOUND GUIDANCE VASC ACCESS LEFT,IR ULTRASOUND GUIDANCE VASC ACCESS RIGHT,BILATERAL EXTREMITY VENOGRAPHY,THROMBECTOMY MECHANICAL  Sedation: Versed 4 mg, Fentanyl 200 mg.  Total Moderate Sedation Time: 74 minutes.  Contrast Volume: 50 ml Omnipaque-300.  Fluoroscopy Time: 23.8 minutes.  Procedure: The procedure,  risks, benefits, and alternatives were explained to the patient. Questions regarding the procedure were encouraged and answered. The patient understands and consents to the procedure.  The bilateral popliteal fossas were prepped with betadine in a sterile fashion, and a sterile drape was applied covering the operative field. A sterile gown and sterile gloves were used for the procedure.  Under sonographic guidance, a micropuncture needle was inserted into the left popliteal vein and removed over a 0018 wire which was  upsized to a [employer].  A 7-French sheath was inserted.  The identical procedure was performed in the right popliteal fossa.  A vertebral catheter was then advanced over a Roadrunner wire across the thrombus within the left popliteal and femoral venous system into the IVC and to the right atrium.  This was removed over a long Amplatz wire.  The identical procedure was performed for the right lower extremity.  Access to the right atrium was achieved. Contrast was injected into both sheaths for lower extremity venography.  Angiojet device was then utilized to lyse the thrombus in both lower extremities and both iliac venous system for a total of 150 seconds.  The patient did experience to vagal episodes and therefore the Angiojet device was ceased at 150 seconds.  Infusion catheters were then advanced over the Amplatz wires bilaterally from the popliteal fossas to the level of the IVC filter. They were secured in place. The sheaths were sewn in place with 0-silk.  Findings: Bilateral lower extremity venography confirms thrombosis of both popliteal and femoral venous systems.  Complications: None.  IMPRESSION: Bilateral lower extremity venous thrombosis is demonstrated with venography.  Angiojet device was utilized for clot lysis.  Infusion catheters were positioned to the level of the IVC filter for subsequent thrombolytic.   Original Report Authenticated By: Donavan Burnet, M.D.    Dg Chest Port 1 View  05/21/2012  *RADIOLOGY REPORT*  Clinical Data: Fever.  Pulmonary embolus shown on last month's chest CT.  PORTABLE CHEST - 1 VIEW  Comparison: 04/20/2012  Findings: Borderline airway thickening may reflect bronchitis or reactive airways disease.  There is a suggestion of minimal peripheral subsegmental atelectasis in the left lower lobe.  Lungs appear otherwise clear.  Heart size is within normal limits.  IMPRESSION:  1.  Potential subsegmental atelectasis peripherally at the left lung base.  Minimal airway  thickening.  Heart size is within normal limits.   Original Report Authenticated By: Dellia Cloud, M.D.    Ir Infusion Thrombol Venous Initial (ms)  05/21/2012  *RADIOLOGY REPORT*  Clinical Data/Indication:  IR ULTRASOUND GUIDANCE VASC ACCESS LEFT,IR ULTRASOUND GUIDANCE VASC ACCESS RIGHT,BILATERAL EXTREMITY VENOGRAPHY,THROMBECTOMY MECHANICAL  Sedation: Versed 4 mg, Fentanyl 200 mg.  Total Moderate Sedation Time: 74 minutes.  Contrast Volume: 50 ml Omnipaque-300.  Fluoroscopy Time: 23.8 minutes.  Procedure: The procedure, risks, benefits, and alternatives were explained to the patient. Questions regarding the procedure were encouraged and answered. The patient understands and consents to the procedure.  The bilateral popliteal fossas were prepped with betadine in a sterile fashion, and a sterile drape was applied covering the operative field. A sterile gown and sterile gloves were used for the procedure.  Under sonographic guidance, a micropuncture needle was inserted into the left popliteal vein and removed over a 0018 wire which was upsized to a [employer].  A 7-French sheath was inserted.  The identical procedure was performed in the right popliteal fossa.  A vertebral catheter was then advanced over a Roadrunner wire across the thrombus within  the left popliteal and femoral venous system into the IVC and to the right atrium.  This was removed over a long Amplatz wire.  The identical procedure was performed for the right lower extremity.  Access to the right atrium was achieved. Contrast was injected into both sheaths for lower extremity venography.  Angiojet device was then utilized to lyse the thrombus in both lower extremities and both iliac venous system for a total of 150 seconds.  The patient did experience to vagal episodes and therefore the Angiojet device was ceased at 150 seconds.  Infusion catheters were then advanced over the Amplatz wires bilaterally from the popliteal fossas to the level of  the IVC filter. They were secured in place. The sheaths were sewn in place with 0-silk.  Findings: Bilateral lower extremity venography confirms thrombosis of both popliteal and femoral venous systems.  Complications: None.  IMPRESSION: Bilateral lower extremity venous thrombosis is demonstrated with venography.  Angiojet device was utilized for clot lysis.  Infusion catheters were positioned to the level of the IVC filter for subsequent thrombolytic.   Original Report Authenticated By: Donavan Burnet, M.D.     Anti-infectives: Anti-infectives     Start     Dose/Rate Route Frequency Ordered Stop   05/22/12 0000   piperacillin-tazobactam (ZOSYN) IVPB 3.375 g        3.375 g 12.5 mL/hr over 240 Minutes Intravenous Every 8 hours 05/21/12 2310     05/21/12 2200   ceFEPIme (MAXIPIME) 1 g in dextrose 5 % 50 mL IVPB  Status:  Discontinued        1 g 100 mL/hr over 30 Minutes Intravenous 3 times per day 05/21/12 2036 05/21/12 2306   05/18/12 2200   ceFEPIme (MAXIPIME) 1 g in dextrose 5 % 50 mL IVPB  Status:  Discontinued        1 g 100 mL/hr over 30 Minutes Intravenous 3 times per day 05/18/12 2053 05/19/12 1625          Assessment/Plan: s/p Procedure(s) (LRB) with comments: COLONOSCOPY (N/A) ESOPHAGOGASTRODUODENOSCOPY (EGD) (N/A)  Bilat LE; iliac v; IVC lysis started 10/4 Doing well For re check today  Trystin Terhune A 05/22/2012

## 2012-05-22 NOTE — Progress Notes (Signed)
[facility] Progress Note [place] TEAM 1 - Stepdown/ICU TEAM   Pang Godinho ZHY:[id number] DOB: 02-28-1956 DOA: 05/18/2012 PCP: Rudi Heap, MD  Brief narrative: 56 year old female with recent history of spinal surgery at [facility] in August. She was hospitalized at [name] for left lower extremity DVT with associated bilateral pulmonary embolism. She subsequently was discharged on Lovenox and Coumadin. Patient had been taking her medication as prescribed and had been following with her outpatient physician. Two weeks ago her INR was 2.4. Three days prior to admission patient began having nausea and vomiting and was unable to keep any food down or fluids. She also began noticing bright red blood per rectum which she supposed was related to her history of hemorrhoids. She was also experiencing significant constipation. Two days prior to admission she began having pain in her right leg with associated swelling. She was also having dizziness and lightheadedness and near blackout spells with significant weakness. Because of these symptoms she presented to the emergency department. Evaluation there demonstrated a low hemoglobin of 6.9 and heme positive stool. She was also hypotensive and orthostatic and had associated tachycardia. She was also found to have a thrombocytosis with a platelet count of 599,000. She doesn't have leukocytosis with a white count greater than 20,000. During previous admission the patient presented with a hemoglobin of 6.9 and a platelet count of 592,000. On date of discharge 04/23/2012 her hemoglobin was 7.4 and her platelets were 670,000. Because of concerns with recurrent DVT and likely PE patient was admitted to the step down unit for further monitoring and treatment. It was important to note that her INR was therapeutic at presentation to the ER at 2.29 and this is quite concerning for an underlying hypercoagulable disorder.  Assessment/Plan:  Hypercoagulation  syndrome/thrombocytosis *Very concerning since redeveloped DVT and PE in setting of therapeutic INR on Coumadin *Because of recurrent PE an IVC filter was recommended and during insertion of this filter it was noted that this patient had extensive near occlusive thrombus extending to the IVC with nonvisualization of the left renal vein. *Appreciate hematology assistance. Full workup in process including hypercoagulable panel. *CT of the abdomen and pelvis shows no gross signs of malignancy. CEA level pending, in addition rule out for APLA or lupus anticoagulant pending-B2 glycoprotein and Cardiolite been more unremarkable  DVT, recurrent, lower extremity, acute- history of LLE DVT 04/2012/associated significant lower extremity edema Has underwent placement of catheters in lower extremities for TNKase infusion which is infusing. Continue to monitor.  Fever Etiology remains unclear. Patient is on Zosyn which will be continued. Fever could be from DVT. Patient is asymptomatic. Blood culture could not be done. Follow up urine cultures *Patient initially with leukocytosis but no fever which is likely reactive and related to extensive thrombosis. As a precaution the patient was started on broad-spectrum antibiotics. No definitive infection or abscess seen on CT abdomen and pelvis.  Acute blood loss anemia *Review of the laboratory data reveals that when patient presented initially in September hemoglobin was around 12 and at time of discharge hemoglobin had decreased to 7.4- suspect it is mostly related to consumption *Patient has currently received 2 units of packed red blood cells this admission and hemoglobin stable *Continue to follow CBC and transfuse as needed.  Lower GI bleed likely sec to anal fissure *Appreciate gastroenterology assistance *EGD and colonoscopy completed 05/19/2012 and only revealed nonbleeding anal fissure *Patient also endorsed a history of GERD to the  gastroenterologist  Iron deficiency *Likely related  to chronic ongoing blood loss exacerbated by recent issues of poor nutrition. Patient and husband endorses patient has really not eaten much for at least 3 weeks *Iron was less than 10 on anemia panel this admission. May need to repeat after receipt of packed red blood cells and it remains low consider IV iron repletion  Pulmonary embolism, bilateral- moderate clot burden *This is recurrent and in the setting of therapeutic INR on Coumadin *Currently her pulmonary status is stable and she is on room air *She is also hemodynamically stable. Tobacco abuse *Counseled regarding cessation - an absolute must  HTN (hypertension) *Blood pressure well controlled *Home ACE inhibitor with thiazide diuretic currently on hold  History of back surgery/leukocytosis *Has been evaluated by neurosurgery this admission. Dr. [name] Dess has also reviewed the CT scan and discussed results with Dr. Sharon Seller. He mentioned abnormality regarding one of the screws in relationship to left common iliac vein is not an uncommon finding and Dr. [name] Dess does not feel that this has contributed to the patient's development of DVT *Dr. Janee Morn at [facility] performed the initial surgery in August 2013 Hyponatremia/hypokalemia *Suspect at this point primarily related to bowel prep so we'll follow electrolyte panel and when can take oral we'll replete with combination of IV and oral medication  Constipation, chronic *Likely exacerbated by recent narcotic usage and suspect has contributed to patient's GI symptomatology in regards to nausea and vomiting *Currently should be adequately treated after bowel prep but will need to begin preventative measures once diet resumed  B12 deficiency  Begin SQ load - will need to be followed in outpt setting  DVT prophylaxis: Had IVC filter placed this admission. Currently on full dose heparin Code Status: Full Family  Communication: Spoke directly with patient at the bedside Disposition Plan: Remain in step down  Consultants: Hematology Pulmonology Gastroenterology Interventional radiology  Procedures: Placement of supra-renal IVC filter on 05/18/2012 interventional radiology EGD and colonoscopy by Dr. Elnoria Howard pending on 05/19/2012  Antibiotics: Zosyn  HPI/Subjective: Patient is awake and alert. Denies any chest pain. Some discomfort in left leg. No nausea or vomiting. No cough.   Objective: Blood pressure 117/60, pulse 91, temperature 97.6 F (36.4 C), temperature source Oral, resp. rate 15, height 5\' 5"  (1.651 m), weight 76 kg (167 lb 8.8 oz), SpO2 100.00%.  Intake/Output Summary (Last 24 hours) at 05/22/12 1107 Last data filed at 05/22/12 1000  Gross per 24 hour  Intake   3383 ml  Output   2100 ml  Net   1283 ml     Exam: General: No acute respiratory distress Lungs: Clear to auscultation bilaterally without wheezes or crackles Cardiovascular: Regular rate and rhythm without murmur gallop or rub normal S1 and S2, significant bilateral lower extremity edema 3+ right lower strategy and 2+ left lower extremity Abdomen: Nontender, nondistended, soft, bowel sounds positive, no rebound, no ascites, no appreciable mass Musculoskeletal: No significant cyanosis, clubbing of bilateral lower extremities Neurological: Alert and oriented x3, moves all extremities x4, exam non focal  Data Reviewed: Basic Metabolic Panel:  Lab 05/21/12 9147 05/20/12 0200 05/19/12 0824 05/18/12 1113  NA 132* 134* 133* 131*  K 3.7 2.9* 3.2* 3.5  CL 100 99 96 89*  CO2 21 23 24 28   GLUCOSE 98 105* 99 116*  BUN 5* 6 7 11   CREATININE 0.47* 0.49* 0.46* 0.68  CALCIUM 8.3* 8.3* 8.4 8.9  MG -- -- -- --  PHOS -- -- -- --   Liver Function Tests:  Lab 05/21/12 0445 05/20/12 0200 05/18/12 1113  AST 27 24 14   ALT 18 14 9   ALKPHOS 274* 176* 135*  BILITOT 0.4 0.7 0.5  PROT 5.6* 5.7* 6.8  ALBUMIN 1.4* 1.5* 1.9*    CBC:  Lab 05/22/12 0500 05/21/12 1800 05/21/12 0445 05/20/12 1707 05/20/12 0200 05/18/12 1113  WBC 10.5 12.6* 13.7* 14.4* 15.1* --  NEUTROABS -- -- -- -- -- 18.8*  HGB 7.9* 7.3* 8.0* 8.6* 8.1* --  HCT 24.5* 22.8* 25.0* 25.9* 24.2* --  MCV 84.2 83.2 83.1 83.3 82.0 --  PLT 426* 400 490* 489* 409* --   CBG: No results found for this basename: GLUCAP:5 in the last 168 hours  Recent Results (from the past 240 hour(s))  MRSA PCR SCREENING     Status: Normal   Collection Time   05/18/12  3:48 PM      Component Value Range Status Comment   MRSA by PCR NEGATIVE  NEGATIVE Final   URINE CULTURE     Status: Normal   Collection Time   05/18/12 11:24 PM      Component Value Range Status Comment   Specimen Description URINE, CLEAN CATCH   Final    Special Requests NONE   Final    Culture  Setup Time 05/19/2012 01:00   Final    Colony Count NO GROWTH   Final    Culture NO GROWTH   Final    Report Status 05/19/2012 FINAL   Final     Leilah Polimeni 05/22/2012 11:14 AM  [phone] (pager)

## 2012-05-23 ENCOUNTER — Inpatient Hospital Stay (HOSPITAL_COMMUNITY): Payer: BC Managed Care – PPO

## 2012-05-23 DIAGNOSIS — I82409 Acute embolism and thrombosis of unspecified deep veins of unspecified lower extremity: Secondary | ICD-10-CM

## 2012-05-23 DIAGNOSIS — D6859 Other primary thrombophilia: Secondary | ICD-10-CM

## 2012-05-23 LAB — CBC
HCT: 25.3 % — ABNORMAL LOW (ref 36.0–46.0)
Hemoglobin: 8.3 g/dL — ABNORMAL LOW (ref 12.0–15.0)
MCH: 27.7 pg (ref 26.0–34.0)
MCHC: 32.8 g/dL (ref 30.0–36.0)
MCV: 84.3 fL (ref 78.0–100.0)
Platelets: 350 10*3/uL (ref 150–400)
RBC: 3 MIL/uL — ABNORMAL LOW (ref 3.87–5.11)
RDW: 16.6 % — ABNORMAL HIGH (ref 11.5–15.5)
WBC: 10.9 10*3/uL — ABNORMAL HIGH (ref 4.0–10.5)

## 2012-05-23 LAB — BASIC METABOLIC PANEL
BUN: 3 mg/dL — ABNORMAL LOW (ref 6–23)
CO2: 19 mEq/L (ref 19–32)
Calcium: 8.3 mg/dL — ABNORMAL LOW (ref 8.4–10.5)
Chloride: 102 mEq/L (ref 96–112)
Creatinine, Ser: 0.49 mg/dL — ABNORMAL LOW (ref 0.50–1.10)
GFR calc Af Amer: >90 mL/min (ref >90)
GFR calc non Af Amer: >90 mL/min (ref >90)
Glucose, Bld: 78 mg/dL (ref 70–99)
Potassium: 3.1 mEq/L — ABNORMAL LOW (ref 3.5–5.1)
Sodium: 135 mEq/L (ref 135–145)

## 2012-05-23 LAB — FIBRINOGEN
Fibrinogen: 162 mg/dL — ABNORMAL LOW (ref 204–475)
Fibrinogen: 189 mg/dL — ABNORMAL LOW (ref 204–475)
Fibrinogen: 155 mg/dL — ABNORMAL LOW (ref 204–475)

## 2012-05-23 LAB — URINE CULTURE: Colony Count: 50000

## 2012-05-23 LAB — HEPARIN LEVEL (UNFRACTIONATED)
Heparin Unfractionated: 0.46 IU/mL (ref 0.30–0.70)
Heparin Unfractionated: 0.46 IU/mL (ref 0.30–0.70)
Heparin Unfractionated: 0.3 IU/mL (ref 0.30–0.70)

## 2012-05-23 MED ORDER — SENNA 8.6 MG PO TABS
2.0000 | ORAL_TABLET | Freq: Every day | ORAL | Status: DC
  Administered 2012-05-23 – 2012-05-27 (×4): 17.2 mg via ORAL
  Filled 2012-05-23 (×5): qty 2

## 2012-05-23 MED ORDER — HEPARIN (PORCINE) IN NACL 100-0.45 UNIT/ML-% IJ SOLN
1900.0000 [IU]/h | INTRAMUSCULAR | Status: DC
  Administered 2012-05-24 – 2012-05-26 (×4): 1900 [IU]/h via INTRAVENOUS
  Filled 2012-05-23 (×12): qty 250

## 2012-05-23 MED ORDER — TENECTEPLASE 50 MG IV KIT
0.1500 mg/h | PACK | INTRAVENOUS | Status: DC
  Administered 2012-05-23 – 2012-05-24 (×2): 0.15 mg/h
  Filled 2012-05-23 (×2): qty 0.5

## 2012-05-23 MED ORDER — MIDAZOLAM HCL 5 MG/5ML IJ SOLN
INTRAMUSCULAR | Status: AC | PRN
  Administered 2012-05-23 (×2): 1 mg via INTRAVENOUS

## 2012-05-23 MED ORDER — WHITE PETROLATUM GEL
Status: AC
  Filled 2012-05-23: qty 5

## 2012-05-23 MED ORDER — SODIUM CHLORIDE 0.9 % IV SOLN
0.1500 mg/h | INTRAVENOUS | Status: DC
  Administered 2012-05-23 – 2012-05-24 (×2): 0.15 mg/h
  Filled 2012-05-23 (×2): qty 0.5

## 2012-05-23 MED ORDER — IOHEXOL 300 MG/ML  SOLN
100.0000 mL | Freq: Once | INTRAMUSCULAR | Status: AC | PRN
  Administered 2012-05-23: 50 mL via INTRAVENOUS

## 2012-05-23 MED ORDER — DOCUSATE SODIUM 100 MG PO CAPS
100.0000 mg | ORAL_CAPSULE | Freq: Two times a day (BID) | ORAL | Status: DC
  Administered 2012-05-23 – 2012-05-27 (×8): 100 mg via ORAL
  Filled 2012-05-23 (×9): qty 1

## 2012-05-23 MED ORDER — FENTANYL CITRATE 0.05 MG/ML IJ SOLN
INTRAMUSCULAR | Status: AC | PRN
  Administered 2012-05-23 (×2): 50 ug via INTRAVENOUS

## 2012-05-23 NOTE — Progress Notes (Signed)
ANTICOAGULATION CONSULT NOTE - Follow Up Consult  Pharmacy Consult for Heparin Indication: DVT  Patient Measurements: Heparin Dosing Weight: 73kg  Vital Signs: Temp: 98.4 F (36.9 C) (10/05 2323) Temp src: Oral (10/05 2323) BP: 133/70 mmHg (10/06 0000) Pulse Rate: 102  (10/06 0000)  Labs:  Basename 05/22/12 2353 05/22/12 1800 05/22/12 0500 05/21/12 1800 05/21/12 1606 05/21/12 1157 05/21/12 0445 05/20/12 0200  HGB -- 8.0* 7.9* -- -- -- -- --  HCT -- 24.7* 24.5* 22.8* -- -- -- --  PLT -- 374 426* 400 -- -- -- --  APTT -- -- -- -- -- -- -- --  LABPROT -- -- -- 23.4* 22.9* 24.2* -- --  INR -- -- -- 2.19* 2.13* 2.29* -- --  HEPARINUNFRC 0.46 0.27* 0.24* -- -- -- -- --  CREATININE -- -- -- -- -- -- 0.47* 0.49*  CKTOTAL -- -- -- -- -- -- -- --  CKMB -- -- -- -- -- -- -- --  TROPONINI -- -- -- -- -- -- -- --    Estimated Creatinine Clearance: 80.1 ml/min (by C-G formula based on Cr of 0.47).  Assessment: 56 yo female with bilateral DVT on TNKase for Heparin  Goal of Therapy:  Heparin level ~0.2 units/ml Monitor platelets by anticoagulation protocol: Yes   Plan:  Decrease Heparin 2000 units/hr Follow-up am labs.  Geannie Risen, PharmD, BCPS 05/23/2012 1:19 AM

## 2012-05-23 NOTE — Progress Notes (Signed)
[facility] Progress Note [place] TEAM 1 - Stepdown/ICU TEAM   [name] [name] ZOX:[id number] DOB: 24-Oct-1955 DOA: 05/18/2012 PCP: Rudi Heap, MD  Brief narrative: 56 year old female with recent history of spinal surgery at [facility] in August. She was hospitalized at [facility] for left lower extremity DVT with associated bilateral pulmonary embolism. She subsequently was discharged on Lovenox and Coumadin. Patient had been taking her medication as prescribed and had been following with her outpatient physician. Two weeks ago her INR was 2.4. Three days prior to admission patient began having nausea and vomiting and was unable to keep any food down or fluids. She also began noticing bright red blood per rectum which she supposed was related to her history of hemorrhoids. She was also experiencing significant constipation. Two days prior to admission she began having pain in her right leg with associated swelling. She was also having dizziness and lightheadedness and near blackout spells with significant weakness. Because of these symptoms she presented to the emergency department. Evaluation there demonstrated a low hemoglobin of 6.9 and heme positive stool. She was also hypotensive and orthostatic and had associated tachycardia. She was also found to have a thrombocytosis with a platelet count of 599,000. She doesn't have leukocytosis with a white count greater than 20,000. During previous admission the patient presented with a hemoglobin of 6.9 and a platelet count of 592,000. On date of discharge 04/23/2012 her hemoglobin was 7.4 and her platelets were 670,000. Because of concerns with recurrent DVT and likely PE patient was admitted to the step down unit for further monitoring and treatment. It was important to note that her INR was therapeutic at presentation to the ER at 2.29 and this is quite concerning for an underlying hypercoagulable disorder.  Assessment/Plan:  Hypercoagulation  syndrome/thrombocytosis *Very concerning since redeveloped DVT and PE in setting of therapeutic INR on Coumadin *Because of recurrent PE an IVC filter was recommended and during insertion of this filter it was noted that this patient had extensive near occlusive thrombus extending to the IVC with nonvisualization of the left renal vein. *Appreciate hematology assistance. Full workup in process including hypercoagulable panel. *CT of the abdomen and pelvis shows no gross signs of malignancy. CEA level pending, in addition rule out for APLA or lupus anticoagulant pending-B2 glycoprotein and Cardiolite been more unremarkable  DVT, recurrent, lower extremity, acute- history of LLE DVT 04/2012/associated significant lower extremity edema Has underwent placement of catheters in lower extremities for TNKase infusion which is infusing. IR following, patient cannot be stuck for lab draws due to TNK infusing and overall poor veins.  Fever *resolved, possibly from DVT  *Patient initially with leukocytosis but no fever which is likely reactive and related to extensive thrombosis. As a precaution the patient was started on broad-spectrum antibiotics. No definitive infection or abscess seen on CT abdomen and pelvis. * Will stop antibiotics 10/6, no fevers or reason to continue  Acute blood loss anemia *Review of the laboratory data reveals that when patient presented initially in September hemoglobin was around 12 and at time of discharge hemoglobin had decreased to 7.4- suspect it is mostly related to consumption *Patient has currently received 2 units of packed red blood cells this admission and hemoglobin stable *Continue to follow CBC and transfuse as needed.  Lower GI bleed likely sec to anal fissure *Appreciate gastroenterology assistance *EGD and colonoscopy completed 05/19/2012 and only revealed nonbleeding anal fissure *Patient also endorsed a history of GERD to the gastroenterologist  Iron  deficiency *Likely  related to chronic ongoing blood loss exacerbated by recent issues of poor nutrition. Patient and husband endorses patient has really not eaten much for at least 3 weeks *Iron was less than 10 on anemia panel this admission. May need to repeat after receipt of packed red blood cells and it remains low consider IV iron repletion  Pulmonary embolism, bilateral- moderate clot burden *This is recurrent and in the setting of therapeutic INR on Coumadin *Currently her pulmonary status is stable and she is on room air *She is also hemodynamically stable.  Tobacco abuse *Counseled regarding cessation - an absolute must  HTN (hypertension) *Blood pressure well controlled *Home ACE inhibitor with thiazide diuretic currently on hold  History of back surgery/leukocytosis *Has been evaluated by neurosurgery this admission. Dr. [name] Dess has also reviewed the CT scan and discussed results with Dr. Sharon Seller. He mentioned abnormality regarding one of the screws in relationship to left common iliac vein is not an uncommon finding and Dr. [name] Dess does not feel that this has contributed to the patient's development of DVT *Dr. Janee Morn at [facility] performed the initial surgery in August 2013  Hyponatremia/hypokalemia *Resolved *Suspect at this point primarily related to bowel prep so we'll follow electrolyte panel and when can take oral we'll replete with combination of IV and oral medication  Constipation, chronic *Likely exacerbated by recent narcotic usage and suspect has contributed to patient's GI symptomatology in regards to nausea and vomiting *Currently should be adequately treated after bowel prep but will need to begin preventative measures once diet resumed *started on docusate scheduled BID, will also schedule senna daily.  B12 deficiency  Begin SQ load - will need to be followed in outpt setting  DVT prophylaxis: Had IVC filter placed this admission.  Currently on full dose heparin, and TNK Code Status: Full Family Communication: Spoke directly with patient at the bedside Disposition Plan: Remain in step down  Consultants: Hematology Pulmonology Gastroenterology Interventional radiology  Procedures: Placement of supra-renal IVC filter on 05/18/2012 interventional radiology EGD and colonoscopy by Dr. Elnoria Howard pending on 05/19/2012  Antibiotics: Zosyn stopped 10/6  HPI/Subjective: Patient is awake and alert. Denies any chest pain. Some discomfort in left leg. No nausea or vomiting. No cough.   Objective: Blood pressure 134/66, pulse 99, temperature 97.8 F (36.6 C), temperature source Oral, resp. rate 14, height 5\' 5"  (1.651 m), weight 76 kg (167 lb 8.8 oz), SpO2 100.00%.  Intake/Output Summary (Last 24 hours) at 05/23/12 1726 Last data filed at 05/23/12 1400  Gross per 24 hour  Intake   1995 ml  Output   2751 ml  Net   -756 ml     Exam: General: No acute respiratory distress Lungs: Clear to auscultation bilaterally without wheezes or crackles Cardiovascular: Regular rate and rhythm without murmur gallop or rub normal S1 and S2, significant bilateral lower extremity edema 3+ right lower strategy and 2+ left lower extremity Abdomen: Nontender, nondistended, soft, bowel sounds positive, no rebound, no ascites, no appreciable mass Musculoskeletal: No significant cyanosis, clubbing of bilateral lower extremities Neurological: Alert and oriented x3, moves all extremities x4, exam non focal  Data Reviewed: Basic Metabolic Panel:  Lab 05/23/12 1610 05/21/12 0445 05/20/12 0200 05/19/12 0824 05/18/12 1113  NA 135 132* 134* 133* 131*  K 3.1* 3.7 2.9* 3.2* 3.5  CL 102 100 99 96 89*  CO2 19 21 23 24 28   GLUCOSE 78 98 105* 99 116*  BUN 3* 5* 6 7 11   CREATININE 0.49* 0.47*  0.49* 0.46* 0.68  CALCIUM 8.3* 8.3* 8.3* 8.4 8.9  MG -- -- -- -- --  PHOS -- -- -- -- --   Liver Function Tests:  Lab 05/21/12 0445 05/20/12 0200  05/18/12 1113  AST 27 24 14   ALT 18 14 9   ALKPHOS 274* 176* 135*  BILITOT 0.4 0.7 0.5  PROT 5.6* 5.7* 6.8  ALBUMIN 1.4* 1.5* 1.9*   CBC:  Lab 05/23/12 0500 05/22/12 1800 05/22/12 0500 05/21/12 1800 05/21/12 0445 05/18/12 1113  WBC 10.9* 10.8* 10.5 12.6* 13.7* --  NEUTROABS -- -- -- -- -- 18.8*  HGB 8.3* 8.0* 7.9* 7.3* 8.0* --  HCT 25.3* 24.7* 24.5* 22.8* 25.0* --  MCV 84.3 83.7 84.2 83.2 83.1 --  PLT 350 374 426* 400 490* --   CBG: No results found for this basename: GLUCAP:5 in the last 168 hours  Recent Results (from the past 240 hour(s))  MRSA PCR SCREENING     Status: Normal   Collection Time   05/18/12  3:48 PM      Component Value Range Status Comment   MRSA by PCR NEGATIVE  NEGATIVE Final   URINE CULTURE     Status: Normal   Collection Time   05/18/12 11:24 PM      Component Value Range Status Comment   Specimen Description URINE, CLEAN CATCH   Final    Special Requests NONE   Final    Culture  Setup Time 05/19/2012 01:00   Final    Colony Count NO GROWTH   Final    Culture NO GROWTH   Final    Report Status 05/19/2012 FINAL   Final   URINE CULTURE     Status: Normal   Collection Time   05/22/12  2:54 AM      Component Value Range Status Comment   Specimen Description URINE, RANDOM   Final    Special Requests NONE   Final    Culture  Setup Time 05/22/2012 12:51   Final    Colony Count 50,000 COLONIES/ML   Final    Culture     Final    Value: Multiple bacterial morphotypes present, none predominant. Suggest appropriate recollection if clinically indicated.   Report Status 05/23/2012 FINAL   Final     [name] [name] 05/23/2012 5:26 PM  [phone] (pager)

## 2012-05-23 NOTE — Procedures (Signed)
Procedure:  Follow up angio on thrombolytic infusion, 45 hours. Findings:  Mild improved patency of IVC and iliac veins.  Fibrinogen 189 at 8 AM. Plan:  Will likely need extensive angioplasty and stenting of iliac veins and IVC tomorrow.  Larger stents being shipped here in AM.  Will decrease TNK dose back to 0.15 mg/hr in each infusion catheter and continue overnight.  Plan completion intervention tomorrow.  Will follow fibrinogen closely.  Hbg stable with no signs of bleeding currently.

## 2012-05-23 NOTE — Progress Notes (Signed)
ANTICOAGULATION CONSULT NOTE - Follow Up Consult  Pharmacy Consult for Heparin Indication: DVT  Patient Measurements: Heparin Dosing Weight: 73kg  Vital Signs: Temp: 98.2 F (36.8 C) (10/06 1129) Temp src: Oral (10/06 1129) BP: 134/66 mmHg (10/06 1513) Pulse Rate: 99  (10/06 1513)  Labs:  Basename 05/23/12 1548 05/23/12 0840 05/23/12 0500 05/22/12 2353 05/22/12 1800 05/22/12 0500 05/21/12 1800 05/21/12 1606 05/21/12 1157 05/21/12 0445  HGB -- -- 8.3* -- 8.0* -- -- -- -- --  HCT -- -- 25.3* -- 24.7* 24.5* -- -- -- --  PLT -- -- 350 -- 374 426* -- -- -- --  APTT -- -- -- -- -- -- -- -- -- --  LABPROT -- -- -- -- -- -- 23.4* 22.9* 24.2* --  INR -- -- -- -- -- -- 2.19* 2.13* 2.29* --  HEPARINUNFRC 0.30 0.46 -- 0.46 -- -- -- -- -- --  CREATININE -- -- 0.49* -- -- -- -- -- -- 0.47*  CKTOTAL -- -- -- -- -- -- -- -- -- --  CKMB -- -- -- -- -- -- -- -- -- --  TROPONINI -- -- -- -- -- -- -- -- -- --    Estimated Creatinine Clearance: 80.1 ml/min (by C-G formula based on Cr of 0.49).  Assessment: 56 yo female with bilateral DVT on TNKase and Heparin. No bleeding issues have been noted during therapy, cbc stable. For recheck angio today, mild improved patency of IVC and iliac veins. Will likely need extensive angioplasty and stenting of iliac veins and IVC tomorrow.   Afternoon heparin level post-IR was within low range at 0.3. TNK dose decreased to 0.15mg /hr / popliteal   Goal of Therapy:  Heparin level 0.2-0.5 units/ml Monitor platelets by anticoagulation protocol: Yes   Plan:  Continue Heparin 1800 units/hr q 6h heparin levels for now  Don Perking, PharmD, BCPS 05/23/2012 4:22 PM

## 2012-05-23 NOTE — Progress Notes (Signed)
ANTICOAGULATION CONSULT NOTE - Follow Up Consult  Pharmacy Consult for Heparin Indication: DVT  Patient Measurements: Heparin Dosing Weight: 73kg  Vital Signs: Temp: 98.2 F (36.8 C) (10/06 0751) Temp src: Oral (10/06 0751) BP: 134/62 mmHg (10/06 0900) Pulse Rate: 102  (10/06 0900)  Labs:  Basename 05/23/12 0840 05/23/12 0500 05/22/12 2353 05/22/12 1800 05/22/12 0500 05/21/12 1800 05/21/12 1606 05/21/12 1157 05/21/12 0445  HGB -- 8.3* -- 8.0* -- -- -- -- --  HCT -- 25.3* -- 24.7* 24.5* -- -- -- --  PLT -- 350 -- 374 426* -- -- -- --  APTT -- -- -- -- -- -- -- -- --  LABPROT -- -- -- -- -- 23.4* 22.9* 24.2* --  INR -- -- -- -- -- 2.19* 2.13* 2.29* --  HEPARINUNFRC 0.46 -- 0.46 0.27* -- -- -- -- --  CREATININE -- 0.49* -- -- -- -- -- -- 0.47*  CKTOTAL -- -- -- -- -- -- -- -- --  CKMB -- -- -- -- -- -- -- -- --  TROPONINI -- -- -- -- -- -- -- -- --    Estimated Creatinine Clearance: 80.1 ml/min (by C-G formula based on Cr of 0.49).  Assessment: 56 yo female with bilateral DVT on TNKase and Heparin. Heparin level trending at upper end of range. No bleeding issues have been noted during therapy, cbc stable. For recheck angio later today.   Goal of Therapy:  Heparin level 0.2-0.5 units/ml Monitor platelets by anticoagulation protocol: Yes   Plan:  Decrease Heparin 1800 units/hr q 6h heparin levels for now  Don Perking, PharmD, BCPS 05/23/2012 10:02 AM

## 2012-05-23 NOTE — Progress Notes (Signed)
Patient in Radiology at present.tolerated transport well. No incident.

## 2012-05-24 ENCOUNTER — Inpatient Hospital Stay (HOSPITAL_COMMUNITY): Payer: BC Managed Care – PPO

## 2012-05-24 LAB — HEPARIN LEVEL (UNFRACTIONATED)
Heparin Unfractionated: 0.2 IU/mL — ABNORMAL LOW (ref 0.30–0.70)
Heparin Unfractionated: 0.45 IU/mL (ref 0.30–0.70)
Heparin Unfractionated: 0.49 IU/mL (ref 0.30–0.70)

## 2012-05-24 LAB — FIBRINOGEN: Fibrinogen: 138 mg/dL — ABNORMAL LOW (ref 204–475)

## 2012-05-24 MED ORDER — FENTANYL CITRATE 0.05 MG/ML IJ SOLN
INTRAMUSCULAR | Status: AC | PRN
  Administered 2012-05-24: 50 ug via INTRAVENOUS
  Administered 2012-05-24 (×2): 25 ug via INTRAVENOUS
  Administered 2012-05-24 (×3): 12.5 ug via INTRAVENOUS
  Administered 2012-05-24 (×2): 25 ug via INTRAVENOUS
  Administered 2012-05-24: 12.5 ug via INTRAVENOUS
  Administered 2012-05-24 (×2): 25 ug via INTRAVENOUS
  Administered 2012-05-24: 50 ug via INTRAVENOUS

## 2012-05-24 MED ORDER — MIDAZOLAM HCL 5 MG/5ML IJ SOLN
INTRAMUSCULAR | Status: AC | PRN
  Administered 2012-05-24: 0.5 mg via INTRAVENOUS
  Administered 2012-05-24 (×5): 1 mg via INTRAVENOUS
  Administered 2012-05-24: 0.5 mg via INTRAVENOUS
  Administered 2012-05-24: 2 mg via INTRAVENOUS
  Administered 2012-05-24 (×2): 0.5 mg via INTRAVENOUS

## 2012-05-24 MED ORDER — IOHEXOL 300 MG/ML  SOLN
450.0000 mL | Freq: Once | INTRAMUSCULAR | Status: AC | PRN
  Administered 2012-05-24: 300 mL via INTRAVENOUS

## 2012-05-24 MED ORDER — SODIUM CHLORIDE 0.9 % IV SOLN
INTRAVENOUS | Status: DC
  Administered 2012-05-24 – 2012-05-25 (×2): 100 mL/h via INTRAVENOUS

## 2012-05-24 NOTE — Progress Notes (Signed)
Tonight we collected 2100 hrs fibrinogen blood  sample and sent to the  Lab.We were subsequently informed that the blood sample was not enough to run  this test. Lab was informed that we were unable to collect another sample at this time. At 0300 hrs we will again attempt to collect same. We will inform MD.

## 2012-05-24 NOTE — ED Notes (Signed)
Sheaths pulled, holding pressure

## 2012-05-24 NOTE — Progress Notes (Signed)
We were unable to draw CBC labs this AM. No blood return from rt hand PIV or rt thigh as ordered. We will try again and inform MD. Perhaps we could have it drawn in Radiology.

## 2012-05-24 NOTE — ED Notes (Signed)
Scanner not working properly. Patient able to verify demographic information for medication administration.

## 2012-05-24 NOTE — Progress Notes (Signed)
ANTICOAGULATION CONSULT NOTE - Follow Up Consult  Pharmacy Consult for Heparin Indication: DVT  Patient Measurements: Heparin Dosing Weight: 73kg  Vital Signs: Temp: 97.7 F (36.5 C) (10/06 2330) Temp src: Oral (10/06 2330) BP: 130/64 mmHg (10/07 0100) Pulse Rate: 99  (10/07 0100)  Labs:  Basename 05/24/12 0230 05/23/12 1548 05/23/12 0840 05/23/12 0500 05/22/12 1800 05/22/12 0500 05/21/12 1800 05/21/12 1606 05/21/12 1157 05/21/12 0445  HGB -- -- -- 8.3* 8.0* -- -- -- -- --  HCT -- -- -- 25.3* 24.7* 24.5* -- -- -- --  PLT -- -- -- 350 374 426* -- -- -- --  APTT -- -- -- -- -- -- -- -- -- --  LABPROT -- -- -- -- -- -- 23.4* 22.9* 24.2* --  INR -- -- -- -- -- -- 2.19* 2.13* 2.29* --  HEPARINUNFRC 0.20* 0.30 0.46 -- -- -- -- -- -- --  CREATININE -- -- -- 0.49* -- -- -- -- -- 0.47*  CKTOTAL -- -- -- -- -- -- -- -- -- --  CKMB -- -- -- -- -- -- -- -- -- --  TROPONINI -- -- -- -- -- -- -- -- -- --    Estimated Creatinine Clearance: 79.1 ml/min (by C-G formula based on Cr of 0.49).  Assessment: 56 yo female with bilateral DVT on TNKase and Heparin. For recheck angio Sunday, mild improved patency of IVC and iliac veins. Will likely need extensive angioplasty and stenting of iliac veins and IVC on Monday (10/7). TNK dose decreased to 0.15mg /hr / popliteal.   Heparin level (0.2) is at lower-end of goal range on 1800 units/hr.   Goal of Therapy:  Heparin level 0.2-0.5 units/ml Monitor platelets by anticoagulation protocol: Yes   Plan:  1. Increase IV heparin to 1900 units/hr. 2. Heparin level in 6 hours.   Lorre Munroe, PharmD 05/24/2012 3:10 AM

## 2012-05-24 NOTE — Progress Notes (Signed)
[facility] Progress Note [place] TEAM 1 - Stepdown/ICU TEAM   [name] [name] WUJ:[id number] DOB: September 04, 1955 DOA: 05/18/2012 PCP: Rudi Heap, MD  Brief narrative: 56 year old female with recent history of spinal surgery at [facility] in August. She was hospitalized at [facility] for left lower extremity DVT with associated bilateral pulmonary embolism. She subsequently was discharged on Lovenox and Coumadin. Patient had been taking her medication as prescribed and had been following with her outpatient physician. Two weeks ago her INR was 2.4. Three days prior to admission patient began having nausea and vomiting and was unable to keep any food down or fluids. She also began noticing bright red blood per rectum which she supposed was related to her history of hemorrhoids. She was also experiencing significant constipation. Two days prior to admission she began having pain in her right leg with associated swelling. She was also having dizziness and lightheadedness and near blackout spells with significant weakness. Because of these symptoms she presented to the emergency department. Evaluation there demonstrated a low hemoglobin of 6.9 and heme positive stool. She was also hypotensive and orthostatic and had associated tachycardia. She was also found to have a thrombocytosis with a platelet count of 599,000. She doesn't have leukocytosis with a white count greater than 20,000. During previous admission the patient presented with a hemoglobin of 6.9 and a platelet count of 592,000. On date of discharge 04/23/2012 her hemoglobin was 7.4 and her platelets were 670,000. Because of concerns with recurrent DVT and likely PE patient was admitted to the step down unit for further monitoring and treatment. It was important to note that her INR was therapeutic at presentation to the ER at 2.29 and this is quite concerning for an underlying hypercoagulable disorder.  Assessment/Plan:  Hypercoagulation  syndrome/thrombocytosis *Very concerning since redeveloped DVT and PE in setting of therapeutic INR on Coumadin *Because of recurrent PE an IVC filter was recommended and during insertion of this filter it was noted that this patient had extensive near occlusive thrombus extending to the IVC with nonvisualization of the left renal vein. *Appreciate hematology assistance. Full workup in process including hypercoagulable panel. *CT of the abdomen and pelvis shows no gross signs of malignancy.   DVT, recurrent, lower extremity, acute- history of LLE DVT 04/2012/associated significant lower extremity edema *Has undergone placement of catheters in lower extremities for TNKase infusion which is infusing. *IR following, patient cannot be stuck for lab draws due to TNK infusing and overall poor veins.  Fever *resolved, possibly from DVT  *Patient initially with leukocytosis but no fever which is likely reactive and related to extensive thrombosis. As a precaution the patient was started on broad-spectrum antibiotics. No definitive infection or abscess seen on CT abdomen and pelvis. * Antibiotics were discontinued 10/6, (no fevers or reason to continue)  Acute blood loss anemia *Review of the laboratory data reveals that when patient presented initially in September hemoglobin was around 12 and at time of discharge hemoglobin had decreased to 7.4- suspect it is mostly related to consumption *Patient has currently received 2 units of packed red blood cells this admission and hemoglobin stable *Continue to follow CBC and transfuse as needed.  Lower GI bleed likely sec to anal fissure *Appreciate gastroenterology assistance *EGD and colonoscopy completed 05/19/2012 and only revealed nonbleeding anal fissure *Patient also endorsed a history of GERD to the gastroenterologist  Iron deficiency *Likely related to chronic ongoing blood loss exacerbated by recent issues of poor nutrition. Patient and husband  endorses patient  has really not eaten much for at least 3 weeks *Iron was less than 10 on anemia panel this admission. May need to repeat after receipt of packed red blood cells and it remains low consider IV iron repletion  Pulmonary embolism, bilateral- moderate clot burden *This is recurrent and in the setting of therapeutic INR on Coumadin *Currently her pulmonary status is stable and she is on room air *She is also hemodynamically stable.  Tobacco abuse *Counseled regarding cessation - an absolute must  HTN (hypertension) *Blood pressure well controlled *Home ACE inhibitor with thiazide diuretic currently on hold  History of back surgery/leukocytosis *Has been evaluated by neurosurgery this admission. Dr. [name] Dess has also reviewed the CT scan and discussed results with Dr. Sharon Seller. He mentioned abnormality regarding one of the screws in relationship to left common iliac vein is not an uncommon finding and Dr. [name] Dess does not feel that this has contributed to the patient's development of DVT *Dr. Janee Morn at [facility] performed the initial surgery in August 2013  Hyponatremia/hypokalemia *Resolved *Suspect at this point primarily related to bowel prep so we'll follow electrolyte panel and when can take oral we'll replete with combination of IV and oral medication  Constipation, chronic *Likely exacerbated by recent narcotic usage and suspect has contributed to patient's GI symptomatology in regards to nausea and vomiting *Currently should be adequately treated after bowel prep but will need to begin preventative measures once diet resumed *started on docusate scheduled BID, will also schedule senna daily.  B12 deficiency  *Begin SQ load - will need to be followed in outpt setting  DVT prophylaxis: Had IVC filter placed this admission. Currently on full dose heparin, and TNK Code Status: Full Family Communication: Spoke directly with patient at the  bedside Disposition Plan: Remain in step down  Consultants: Hematology Pulmonology Gastroenterology Interventional radiology  Procedures: Placement of supra-renal IVC filter on 05/18/2012 interventional radiology EGD and colonoscopy by Dr. Elnoria Howard pending on 05/19/2012  Antibiotics: Zosyn stopped 10/6  HPI/Subjective: Alert. Complaining of hunger. No chest pain or shortness of breath and markedly improved right lower extremity discomfort.   Objective: Blood pressure 134/73, pulse 106, temperature 98.4 F (36.9 C), temperature source Oral, resp. rate 14, height 5\' 5"  (1.651 m), weight 76 kg (167 lb 8.8 oz), SpO2 90.00%.  Intake/Output Summary (Last 24 hours) at 05/24/12 1116 Last data filed at 05/24/12 1100  Gross per 24 hour  Intake   2942 ml  Output   2950 ml  Net     -8 ml     Exam: General: No acute respiratory distress Lungs: Clear to auscultation bilaterally without wheezes or crackles Cardiovascular: Regular rate and rhythm without murmur gallop or rub normal S1 and S2, significant bilateral lower extremity edema 3+ right lower strategy and 2+ left lower extremity-right lower extremity is actually softer to the touch Abdomen: Nontender, nondistended, soft, bowel sounds positive, no rebound, no ascites, no appreciable mass Musculoskeletal: No significant cyanosis, clubbing of bilateral lower extremities Neurological: Alert and oriented x3, moves all extremities x4, exam non focal  Data Reviewed: Basic Metabolic Panel:  Lab 05/23/12 1610 05/21/12 0445 05/20/12 0200 05/19/12 0824 05/18/12 1113  NA 135 132* 134* 133* 131*  K 3.1* 3.7 2.9* 3.2* 3.5  CL 102 100 99 96 89*  CO2 19 21 23 24 28   GLUCOSE 78 98 105* 99 116*  BUN 3* 5* 6 7 11   CREATININE 0.49* 0.47* 0.49* 0.46* 0.68  CALCIUM 8.3* 8.3* 8.3* 8.4 8.9  MG -- -- -- -- --  PHOS -- -- -- -- --   Liver Function Tests:  Lab 05/21/12 0445 05/20/12 0200 05/18/12 1113  AST 27 24 14   ALT 18 14 9   ALKPHOS 274*  176* 135*  BILITOT 0.4 0.7 0.5  PROT 5.6* 5.7* 6.8  ALBUMIN 1.4* 1.5* 1.9*   CBC:  Lab 05/23/12 0500 05/22/12 1800 05/22/12 0500 05/21/12 1800 05/21/12 0445 05/18/12 1113  WBC 10.9* 10.8* 10.5 12.6* 13.7* --  NEUTROABS -- -- -- -- -- 18.8*  HGB 8.3* 8.0* 7.9* 7.3* 8.0* --  HCT 25.3* 24.7* 24.5* 22.8* 25.0* --  MCV 84.3 83.7 84.2 83.2 83.1 --  PLT 350 374 426* 400 490* --   CBG: No results found for this basename: GLUCAP:5 in the last 168 hours  Recent Results (from the past 240 hour(s))  MRSA PCR SCREENING     Status: Normal   Collection Time   05/18/12  3:48 PM      Component Value Range Status Comment   MRSA by PCR NEGATIVE  NEGATIVE Final   URINE CULTURE     Status: Normal   Collection Time   05/18/12 11:24 PM      Component Value Range Status Comment   Specimen Description URINE, CLEAN CATCH   Final    Special Requests NONE   Final    Culture  Setup Time 05/19/2012 01:00   Final    Colony Count NO GROWTH   Final    Culture NO GROWTH   Final    Report Status 05/19/2012 FINAL   Final   URINE CULTURE     Status: Normal   Collection Time   05/22/12  2:54 AM      Component Value Range Status Comment   Specimen Description URINE, RANDOM   Final    Special Requests NONE   Final    Culture  Setup Time 05/22/2012 12:51   Final    Colony Count 50,000 COLONIES/ML   Final    Culture     Final    Value: Multiple bacterial morphotypes present, none predominant. Suggest appropriate recollection if clinically indicated.   Report Status 05/23/2012 FINAL   Final     Masyn Fullam L.ANP 05/24/2012 11:16 AM  [phone] (pager)

## 2012-05-24 NOTE — ED Notes (Signed)
Moved to bed, placed on bedpan.

## 2012-05-24 NOTE — Procedures (Signed)
Procedure:  Follow up angio, mechanical thrombectomy, angioplasty of both lower extremities and IVC.  IVC and bilateral lower extremity stent placement. Findings:  Improved patency of IVC but still considerable chronic thrombus in IVC and bilateral lower extremities. Extensive treatment performed including angioplasty, additional mechanical thrombectomy and stenting of lower IVC and bilateral iliac veins. See full dictated report. Plan:  D/C thrombolytic therapy, continue IV heparin and start anticoagulation with Coumadin or other therapy per Hematology. Lower extremity compression stockings and SCD's to improve outflow. Will electively remove IVC stent likely later this week.

## 2012-05-24 NOTE — ED Notes (Signed)
O2 d/c'd 

## 2012-05-24 NOTE — Progress Notes (Signed)
Melinda from Radiology attempt to draw cbc but without success. Dr Fredia Sorrow returned the page and was updated . Dr Fredia Sorrow spoke to [place]. We were instructed to hold cbc, leave Streptokinase  at the same rate and do not draw anymore fibrinogen  Because he is going to try and finish pt s' intervention today.

## 2012-05-24 NOTE — ED Notes (Signed)
O2 decreased to 1L/[place] 

## 2012-05-24 NOTE — Progress Notes (Signed)
ANTICOAGULATION CONSULT NOTE - Follow Up Consult  Pharmacy Consult for Heparin Indication: DVT  Patient Measurements: Heparin Dosing Weight: 73kg  Vital Signs: Temp: 97.6 F (36.4 C) (10/07 2000) Temp src: Oral (10/07 2000) BP: 129/77 mmHg (10/07 2000) Pulse Rate: 105  (10/07 2000)  Labs:  Basename 05/24/12 2134 05/24/12 1055 05/24/12 0230 05/23/12 0500 05/22/12 1800 05/22/12 0500  HGB -- -- -- 8.3* 8.0* --  HCT -- -- -- 25.3* 24.7* 24.5*  PLT -- -- -- 350 374 426*  APTT -- -- -- -- -- --  LABPROT -- -- -- -- -- --  INR -- -- -- -- -- --  HEPARINUNFRC 0.49 0.45 0.20* -- -- --  CREATININE -- -- -- 0.49* -- --  CKTOTAL -- -- -- -- -- --  CKMB -- -- -- -- -- --  TROPONINI -- -- -- -- -- --    Estimated Creatinine Clearance: 79.1 ml/min (by C-G formula based on Cr of 0.49).     . sodium chloride 100 mL/hr (05/24/12 1900)  . heparin 1,900 Units/hr (05/24/12 1900)  . DISCONTD: sodium chloride 50 mL/hr at 05/24/12 0800  . DISCONTD: sodium chloride 30 mL/hr at 05/23/12 0800  . DISCONTD: sodium chloride 30 mL/hr at 05/22/12 1300  . DISCONTD: tenecteplase (TNKase) infusion Stopped (05/24/12 1630)  . DISCONTD: tenecteplase (TNKase) infusion Stopped (05/24/12 1630)     Assessment: 56 yo female with bilateral DVT on Heparin.  Her TNKase was stopped after her IR procedure this afternoon.  Her heparin level remains within goal range, and her goal range can be liberalized now that TNKase is off.  Goal of Therapy:  Heparin level 0.3-0.7 units/ml Monitor platelets by anticoagulation protocol: Yes   Plan:  1. Continue IV heparin at1900 units/hr. 2. Check AM Heparin level and CBC   Estella Husk, Pharm.D., BCPS Clinical Pharmacist  Phone [phone] Pager [phone] 05/24/2012, 10:21 PM

## 2012-05-24 NOTE — Progress Notes (Signed)
ANTICOAGULATION CONSULT NOTE - Follow Up Consult  Pharmacy Consult for Heparin Indication: DVT  Patient Measurements: Heparin Dosing Weight: 73kg  Vital Signs: Temp: 97.7 F (36.5 C) (10/07 1200) Temp src: Oral (10/07 1200) BP: 135/69 mmHg (10/07 1200) Pulse Rate: 101  (10/07 1200)  Labs:  Basename 05/24/12 1055 05/24/12 0230 05/23/12 1548 05/23/12 0500 05/22/12 1800 05/22/12 0500 05/21/12 1800 05/21/12 1606  HGB -- -- -- 8.3* 8.0* -- -- --  HCT -- -- -- 25.3* 24.7* 24.5* -- --  PLT -- -- -- 350 374 426* -- --  APTT -- -- -- -- -- -- -- --  LABPROT -- -- -- -- -- -- 23.4* 22.9*  INR -- -- -- -- -- -- 2.19* 2.13*  HEPARINUNFRC 0.45 0.20* 0.30 -- -- -- -- --  CREATININE -- -- -- 0.49* -- -- -- --  CKTOTAL -- -- -- -- -- -- -- --  CKMB -- -- -- -- -- -- -- --  TROPONINI -- -- -- -- -- -- -- --    Estimated Creatinine Clearance: 79.1 ml/min (by C-G formula based on Cr of 0.49).  Assessment: 56 yo female with bilateral DVT on TNKase and Heparin. For recheck angio Sunday, mild improved patency of IVC and iliac veins. Will likely need extensive angioplasty and stenting of iliac veins and IVC on Monday (10/7). TNK dose decreased to 0.15mg /hr / popliteal. Heparin level is therapeutic at 0.45,  Goal of Therapy:  Heparin level 0.2-0.5 units/ml Monitor platelets by anticoagulation protocol: Yes   Plan:  1. Continue IV heparin at1900 units/hr. 2. Heparin level in 6 hours.   Lysle Pearl, PharmD, BCPS Pager # [phone] 05/24/2012 12:31 PM

## 2012-05-24 NOTE — Progress Notes (Signed)
I have reviewed and discussed the care of this patient in detail with the nurse practitioner including pertinent patient records, physical exam findings and data. I agree with details of this encounter.  

## 2012-05-24 NOTE — Progress Notes (Signed)
Spoke to Radiology regarding pt s' lab draws including fibrinogen level of 138. Radiology Nurse will try to assist [place] with drawing same. We also paged Dr Fredia Sorrow  We will inform him of lab results and problems of drawing the labs.

## 2012-05-25 DIAGNOSIS — R5381 Other malaise: Secondary | ICD-10-CM | POA: Diagnosis not present

## 2012-05-25 LAB — CBC
HCT: 23.5 % — ABNORMAL LOW (ref 36.0–46.0)
Hemoglobin: 7.7 g/dL — ABNORMAL LOW (ref 12.0–15.0)
MCH: 27.1 pg (ref 26.0–34.0)
MCHC: 32.8 g/dL (ref 30.0–36.0)
MCV: 82.7 fL (ref 78.0–100.0)
Platelets: 412 10*3/uL — ABNORMAL HIGH (ref 150–400)
RBC: 2.84 MIL/uL — ABNORMAL LOW (ref 3.87–5.11)
RDW: 16.6 % — ABNORMAL HIGH (ref 11.5–15.5)
WBC: 9.4 10*3/uL (ref 4.0–10.5)

## 2012-05-25 LAB — HEPARIN LEVEL (UNFRACTIONATED): Heparin Unfractionated: 0.41 IU/mL (ref 0.30–0.70)

## 2012-05-25 MED ORDER — SODIUM CHLORIDE 0.9 % IV SOLN
25.0000 mg | Freq: Once | INTRAVENOUS | Status: AC
  Administered 2012-05-25: 25 mg via INTRAVENOUS
  Filled 2012-05-25: qty 0.5

## 2012-05-25 MED ORDER — SODIUM CHLORIDE 0.9 % IV SOLN
400.0000 mg | Freq: Once | INTRAVENOUS | Status: AC
  Administered 2012-05-26: 400 mg via INTRAVENOUS
  Filled 2012-05-25 (×2): qty 8

## 2012-05-25 MED ORDER — SODIUM CHLORIDE 0.9 % IV SOLN
1000.0000 mg | Freq: Once | INTRAVENOUS | Status: AC
  Administered 2012-05-25: 1000 mg via INTRAVENOUS
  Filled 2012-05-25 (×2): qty 20

## 2012-05-25 NOTE — Progress Notes (Signed)
Medicine and rehabilitation consult has been requested. Await both physical and occupational therapy evaluations to establish the need for ongoing therapies. Will followup with formal rehabilitation consult as all disciplines are completed

## 2012-05-25 NOTE — Progress Notes (Signed)
[facility] Progress Note [place] TEAM 1 - Stepdown/ICU TEAM   Christon Rindler WJX:[id number] DOB: 22-Oct-1955 DOA: 05/18/2012 PCP: Rudi Heap, MD  Brief narrative: 56 year old female with recent history of spinal surgery at [facility] in August. She was hospitalized at [facility] for left lower extremity DVT with associated bilateral pulmonary embolism. She subsequently was discharged on Lovenox and Coumadin. Patient had been taking her medication as prescribed and had been following with her outpatient physician. Two weeks ago her INR was 2.4. Three days prior to admission patient began having nausea and vomiting and was unable to keep any food down or fluids. She also began noticing bright red blood per rectum which she supposed was related to her history of hemorrhoids. She was also experiencing significant constipation. Two days prior to admission she began having pain in her right leg with associated swelling. She was also having dizziness and lightheadedness and near blackout spells with significant weakness. Because of these symptoms she presented to the emergency department. Evaluation there demonstrated a low hemoglobin of 6.9 and heme positive stool. She was also hypotensive and orthostatic and had associated tachycardia. She was also found to have a thrombocytosis with a platelet count of 599,000. She doesn't have leukocytosis with a white count greater than 20,000. During previous admission the patient presented with a hemoglobin of 6.9 and a platelet count of 592,000. On date of discharge 04/23/2012 her hemoglobin was 7.4 and her platelets were 670,000. Because of concerns with recurrent DVT and likely PE patient was admitted to the step down unit for further monitoring and treatment. It was important to note that her INR was therapeutic at presentation to the ER at 2.29 and this is quite concerning for an underlying hypercoagulable disorder.  Assessment/Plan:  Hypercoagulation  syndrome/thrombocytosis *Very concerning since redeveloped DVT and PE in setting of therapeutic INR on Coumadin * Etiology felt to be related to antiphospholipid antibody syndrome-lupus anticoagulant positive but hematology feels this may be transient. *Because of recurrent PE an IVC filter was recommended and during insertion of this filter it was noted that this patient had extensive near occlusive thrombus extending to the IVC with nonvisualization of the left renal vein. *Appreciate hematology assistance. Full workup in process including hypercoagulable panel. *CT of the abdomen and pelvis shows no gross signs of malignancy.   DVT, recurrent, lower extremity, acute- history of LLE DVT 04/2012/associated significant lower extremity edema: A) status post mechanical thrombectomy B) angioplasty and stent placement and  C) transcatheter thrombolysis infusion D) status post IVC filter placement *Appreciate interventional radiology assistance *After procedure on 05/26/2012 interventional radiology notes chronic clot remains-please see the procedure note *Hematology is recommending to continue heparin until time of discharge then to transition to Lovenox and continue Lovenox after discharge consideration for initiation of Coumadin after outpatient followup in the hematology clinic *Continue thigh-high compression stockings with a 20-30 compression strength *Patient is to followup in the interventional radiology clinic 3 weeks after discharge-discussion regarding removal of IVC filter will be performed at clinic visit  Fever *resolved, possibly from DVT  *Patient initially with leukocytosis but no fever which is likely reactive and related to extensive thrombosis. As a precaution the patient was started on broad-spectrum antibiotics. No definitive infection or abscess seen on CT abdomen and pelvis. * Antibiotics were discontinued 10/6, (no fevers or reason to continue)  Acute blood loss  anemia *Review of the laboratory data reveals that when patient presented initially in September hemoglobin was around 12 and at time  of discharge hemoglobin had decreased to 7.4- suspect it is mostly related to consumption *Patient has currently received 2 units of packed red blood cells this admission and hemoglobin stable *Continue to follow CBC and transfuse as needed.  Lower GI bleed likely sec to anal fissure *Appreciate gastroenterology assistance *EGD and colonoscopy completed 05/19/2012 and only revealed nonbleeding anal fissure *Patient also endorsed a history of GERD to the gastroenterologist  Iron deficiency *Likely related to chronic ongoing blood loss exacerbated by recent issues of poor nutrition. Patient and husband endorses patient has really not eaten much for at least 3 weeks *Iron was less than 10 on anemia panel this admission.  *Hematology has ordered IV dextran with pharmacy to dose  Pulmonary embolism, bilateral- moderate clot burden *This is recurrent and in the setting of therapeutic INR on Coumadin *Status post IVC filter placement *Currently her pulmonary status is stable and she is on room air *She is also hemodynamically stable.  Tobacco abuse *Counseled regarding cessation - an absolute must  HTN (hypertension) *Blood pressure well controlled *Home ACE inhibitor with thiazide diuretic currently on hold  History of back surgery/leukocytosis *Has been evaluated by neurosurgery this admission. Dr. [name] Dess has also reviewed the CT scan and discussed results with Dr. Sharon Seller. He mentioned abnormality regarding one of the screws in relationship to left common iliac vein is not an uncommon finding and Dr. [name] Dess does not feel that this has contributed to the patient's development of DVT *Dr. Janee Morn at [facility] performed the initial surgery in August 2013  Hyponatremia/hypokalemia *Resolved *Suspect at this point primarily related to  bowel prep so we'll follow electrolyte panel and when can take oral we'll replete with combination of IV and oral medication  Constipation, chronic *Likely exacerbated by recent narcotic usage and suspect has contributed to patient's GI symptomatology in regards to nausea and vomiting *Currently should be adequately treated after bowel prep but will need to begin preventative measures once diet resumed *started on docusate scheduled BID, will also schedule senna daily.  B12 deficiency  *Begin SQ load - will need to be followed in outpt setting  DVT prophylaxis: Had IVC filter placed this admission. Currently on full dose heparin Code Status: Full Family Communication: Spoke directly with patient at the bedside Disposition Plan: Transfer to telemetry  Consultants: Hematology Pulmonology Gastroenterology Interventional radiology Inpatient rehabilitation team  Procedures: Placement of supra-renal IVC filter on 05/18/2012 interventional radiology  EGD and colonoscopy by Dr. Elnoria Howard pending on 05/19/2012  Bilateral lower extremity catheter placement with subsequent TNK infusion  Angioplasty with mechanical thrombectomy and stenting of lower IVC and bilateral iliac veins on 05/24/2012 per interventional radiology   Antibiotics: Zosyn stopped 10/6  HPI/Subjective: Complains of being excessively weak especially when attempting to stand or mobilize. States right lower extremity swelling has markedly decreased and she has no further pain in leg.   Objective: Blood pressure 122/58, pulse 107, temperature 97.8 F (36.6 C), temperature source Oral, resp. rate 18, height 5\' 5"  (1.651 m), weight 76 kg (167 lb 8.8 oz), SpO2 98.00%.  Intake/Output Summary (Last 24 hours) at 05/25/12 1417 Last data filed at 05/25/12 1300  Gross per 24 hour  Intake   2755 ml  Output   2275 ml  Net    480 ml     Exam: General: No acute respiratory distress Lungs: Clear to auscultation bilaterally  without wheezes or crackles Cardiovascular: Regular rate and rhythm without murmur gallop or rub normal S1 and S2, significant  bilateral lower extremity edema 2+ right lower strategy and 1+ left lower extremity-right lower extremity is actually softer to the touch Abdomen: Nontender, nondistended, soft, bowel sounds positive, no rebound, no ascites, no appreciable mass Musculoskeletal: No significant cyanosis, clubbing of bilateral lower extremities Neurological: Alert and oriented x3, moves all extremities x4, exam non focal  Data Reviewed: Basic Metabolic Panel:  Lab 05/23/12 5409 05/21/12 0445 05/20/12 0200 05/19/12 0824  NA 135 132* 134* 133*  K 3.1* 3.7 2.9* 3.2*  CL 102 100 99 96  CO2 19 21 23 24   GLUCOSE 78 98 105* 99  BUN 3* 5* 6 7  CREATININE 0.49* 0.47* 0.49* 0.46*  CALCIUM 8.3* 8.3* 8.3* 8.4  MG -- -- -- --  PHOS -- -- -- --   Liver Function Tests:  Lab 05/21/12 0445 05/20/12 0200  AST 27 24  ALT 18 14  ALKPHOS 274* 176*  BILITOT 0.4 0.7  PROT 5.6* 5.7*  ALBUMIN 1.4* 1.5*   CBC:  Lab 05/25/12 0515 05/23/12 0500 05/22/12 1800 05/22/12 0500 05/21/12 1800  WBC 9.4 10.9* 10.8* 10.5 12.6*  NEUTROABS -- -- -- -- --  HGB 7.7* 8.3* 8.0* 7.9* 7.3*  HCT 23.5* 25.3* 24.7* 24.5* 22.8*  MCV 82.7 84.3 83.7 84.2 83.2  PLT 412* 350 374 426* 400   CBG: No results found for this basename: GLUCAP:5 in the last 168 hours  Recent Results (from the past 240 hour(s))  MRSA PCR SCREENING     Status: Normal   Collection Time   05/18/12  3:48 PM      Component Value Range Status Comment   MRSA by PCR NEGATIVE  NEGATIVE Final   URINE CULTURE     Status: Normal   Collection Time   05/18/12 11:24 PM      Component Value Range Status Comment   Specimen Description URINE, CLEAN CATCH   Final    Special Requests NONE   Final    Culture  Setup Time 05/19/2012 01:00   Final    Colony Count NO GROWTH   Final    Culture NO GROWTH   Final    Report Status 05/19/2012 FINAL   Final    URINE CULTURE     Status: Normal   Collection Time   05/22/12  2:54 AM      Component Value Range Status Comment   Specimen Description URINE, RANDOM   Final    Special Requests NONE   Final    Culture  Setup Time 05/22/2012 12:51   Final    Colony Count 50,000 COLONIES/ML   Final    Culture     Final    Value: Multiple bacterial morphotypes present, none predominant. Suggest appropriate recollection if clinically indicated.   Report Status 05/23/2012 FINAL   Final     ELLIS,ALLISON L.ANP 05/25/2012 2:17 PM  [phone] (pager)  I have examined the patient, reviewed the chart and modified the above note which I agree with.   Calvert Cantor, MD [phone]

## 2012-05-25 NOTE — Progress Notes (Signed)
Pt returned from IR on monitor with RN at bedside; pt A&Ox4; no complaints; VS stable; posterior popliteal site assessed; area soft, dressing clean, dry & intact; will continue to monitor closely and update as needed; family at bedside

## 2012-05-25 NOTE — Evaluation (Signed)
Occupational Therapy Evaluation Patient Details Name: [name] [name] MRN: [id number] DOB: [birth year] Today's Date: 05/25/2012 Time: 9562-1308 OT Time Calculation (min): 55 min  OT Assessment / Plan / Recommendation Clinical Impression  Pleasant 56 yr old female admitted for bilateral PE and left LE DVT.  Underwent angioplasty and IVC placement. Also with recent lumbar fusion in August at [facility].  Now presents with increased swelling in LE's and pain resutling in decreased ability to perform basic selfcare and mobility tasks.  Feel she will benefit from acute OT to help increase overall independence.  Also feel she will benefit from short term CIR prior to home since her husband works during the day.    OT Assessment  Patient needs continued OT Services    Follow Up Recommendations  Inpatient Rehab    Barriers to Discharge Decreased caregiver support husband works during the day  Equipment Recommendations  3 in 1 bedside comode;Tub/shower seat;Rolling walker with 5" wheels       Frequency  Min 2X/week    Precautions / Restrictions Precautions Precautions: Fall;Back Precaution Booklet Issued: No Restrictions Weight Bearing Restrictions: No   Pertinent Vitals/Pain Pain stated at 7/10 but pt refused notifying nurse for meds,   HR increased to 127 with mobility, O2 98 % on room air.    ADL  Eating/Feeding: Simulated;Independent Where Assessed - Eating/Feeding: Edge of bed Grooming: Simulated;Set up Where Assessed - Grooming: Unsupported sitting Upper Body Bathing: Simulated;Set up Where Assessed - Upper Body Bathing: Unsupported sitting Lower Body Bathing: Simulated;Moderate assistance Where Assessed - Lower Body Bathing: Supported sit to stand Upper Body Dressing: Simulated;Set up Where Assessed - Upper Body Dressing: Unsupported sitting Lower Body Dressing: Simulated;Maximal assistance Where Assessed - Lower Body Dressing: Supported sit to stand Toilet Transfer:  Performed;Minimal assistance Toilet Transfer Method: Other (comment) (ambulate with hand held assist to 3:1) Acupuncturist: Bedside commode Toileting - Clothing Manipulation and Hygiene: Simulated;Minimal assistance Where Assessed - Engineer, mining and Hygiene: Sit to stand from 3-in-1 or toilet Tub/Shower Transfer Method: Not assessed Transfers/Ambulation Related to ADLs: Pt able to perform stand pivot transfers from bed with mod assist and from bedside commode with min assist. ADL Comments: Pt with decreased ability to reach her feet for simulated selfcare and toileting.  Will likely benfit from AE education.  Husband works during the day so pt needs to be modified independent for toileting and short distance mobility.    OT Diagnosis: Generalized weakness;Acute pain  OT Problem List: Decreased strength;Impaired balance (sitting and/or standing);Pain;Increased edema;Decreased knowledge of use of DME or AE OT Treatment Interventions: Self-care/ADL training;DME and/or AE instruction;Therapeutic activities;Patient/family education;Balance training   OT Goals Acute Rehab OT Goals OT Goal Formulation: With patient Time For Goal Achievement: 06/08/12 Potential to Achieve Goals: Good ADL Goals Pt Will Perform Grooming: with supervision;Standing at sink;Other (comment) (2 tasks) ADL Goal: Grooming - Progress: Goal set today Pt Will Perform Lower Body Bathing: with supervision;Sit to stand from bed;with adaptive equipment ADL Goal: Lower Body Bathing - Progress: Goal set today Pt Will Perform Lower Body Dressing: with supervision;Sit to stand from bed ADL Goal: Lower Body Dressing - Progress: Goal set today Pt Will Transfer to Toilet: with supervision;Ambulation;3-in-1;with DME ADL Goal: Toilet Transfer - Progress: Goal set today Pt Will Perform Toileting - Clothing Manipulation: with supervision;Sitting on 3-in-1 or toilet;Standing ADL Goal: Toileting - Clothing  Manipulation - Progress: Goal set today Pt Will Perform Toileting - Hygiene: with supervision;Sit to stand from 3-in-1/toilet ADL Goal: Toileting - Hygiene -  Progress: Goal set today Pt Will Perform Tub/Shower Transfer: Shower transfer;Ambulation;Shower seat with back;with DME ADL Goal: Tub/Shower Transfer - Progress: Goal set today  Visit Information  Last OT Received On: 05/25/12 Assistance Needed: +1    Subjective Data  Subjective: "How long do you think it will take for the swelling to go down?" Patient Stated Goal: To get back to walking and back to work.   Prior Functioning     Home Living Lives With: Spouse Available Help at Discharge: Available PRN/intermittently;Other (Comment) (spouse works) Type of Home: House Home Access: Level entry Home Layout: One level Bathroom Shower/Tub: Health visitor: Pharmacist, community: Yes How Accessible: Accessible via walker Home Adaptive Equipment: None Prior Function Level of Independence: Independent Driving: Yes Vocation: Full time employment Communication Communication: No difficulties Dominant Hand: Left         Vision/Perception Vision - Assessment Eye Alignment: Within Functional Limits Vision Assessment: Vision not tested Perception Perception: Within Functional Limits Praxis Praxis: Intact   Cognition  Overall Cognitive Status: Appears within functional limits for tasks assessed/performed Arousal/Alertness: Awake/alert Orientation Level: Appears intact for tasks assessed Behavior During Session: [facility] for tasks performed    Extremity/Trunk Assessment Right Upper Extremity Assessment RUE ROM/Strength/Tone: Unable to fully assess;Due to precautions (WFLS for AROM) RUE Sensation: WFL - Light Touch RUE Coordination: WFL - gross/fine motor Left Upper Extremity Assessment LUE ROM/Strength/Tone: Unable to fully assess;Due to precautions [facility] for AROM) LUE Sensation: WFL - Light  Touch LUE Coordination: WFL - gross/fine motor Trunk Assessment Trunk Assessment: Normal     Mobility Bed Mobility Bed Mobility: Rolling Left;Left Sidelying to Sit Rolling Left: With rail;4: Min assist Left Sidelying to Sit: 4: Min assist;With rails;HOB flat Transfers Transfers: Sit to Stand Sit to Stand: Without upper extremity assist;From bed;3: Mod assist           Balance Balance Balance Assessed: Yes Dynamic Standing Balance Dynamic Standing - Balance Support: Left upper extremity supported;Right upper extremity supported Dynamic Standing - Level of Assistance: 4: Min assist   End of Session OT - End of Session Equipment Utilized During Treatment: Gait belt Activity Tolerance: Patient tolerated treatment well Patient left: in bed;with call bell/phone within reach Nurse Communication: Mobility status     [name] [name] OTR/L 05/25/2012, 5:03 PM

## 2012-05-25 NOTE — Progress Notes (Addendum)
Patient ID: [name] [name], female   DOB: Apr 04, 1956, 56 y.o.   MRN: [id number]   Bilat lower extr; iliac veins and IVC thrombus Mechanical thrombectomy and transcatheter infusion for last 4 days Angioplasty/and stent placement Chronic clot remains IVC filter in place with some residual clot noted  Plan: SCDs - in place           TED hose- in place           Ambulate-  pt has been up to potty in room Heparin started May start coumadin anytime - per Hemotology Will need Rx: thigh high compression stockings Of 20-30 compression strength  We will see pt in clinic 3 weeks from dc Plan for removal of IVC filter after clinic visit.  Afeb; VSS Legs seem smaller; NT Good color Sites are nt; no bleeding Hg: 7.7(8.3)  Pt and husband aware of these plans Per Dr Fredia Sorrow

## 2012-05-25 NOTE — Progress Notes (Signed)
ANTICOAGULATION CONSULT NOTE - Follow Up Consult  Pharmacy Consult for Heparin Indication: DVT  Patient Measurements: Heparin Dosing Weight: 73kg  Vital Signs: Temp: 97.7 F (36.5 C) (10/08 0800) Temp src: Oral (10/08 0800) BP: 118/70 mmHg (10/08 0800) Pulse Rate: 107  (10/08 0500)  Labs:  Basename 05/25/12 0515 05/24/12 2134 05/24/12 1055 05/23/12 0500 05/22/12 1800  HGB 7.7* -- -- 8.3* --  HCT 23.5* -- -- 25.3* 24.7*  PLT 412* -- -- 350 374  APTT -- -- -- -- --  LABPROT -- -- -- -- --  INR -- -- -- -- --  HEPARINUNFRC 0.41 0.49 0.45 -- --  CREATININE -- -- -- 0.49* --  CKTOTAL -- -- -- -- --  CKMB -- -- -- -- --  TROPONINI -- -- -- -- --    Estimated Creatinine Clearance: 79.1 ml/min (by C-G formula based on Cr of 0.49).     . sodium chloride 100 mL/hr (05/25/12 1610)  . heparin 1,900 Units/hr (05/24/12 1930)  . DISCONTD: sodium chloride 50 mL/hr at 05/24/12 0800  . DISCONTD: sodium chloride 30 mL/hr at 05/23/12 0800  . DISCONTD: sodium chloride 30 mL/hr at 05/22/12 1300  . DISCONTD: tenecteplase (TNKase) infusion Stopped (05/24/12 1630)  . DISCONTD: tenecteplase (TNKase) infusion Stopped (05/24/12 1630)     Assessment: 56 yo female with bilateral DVT on Heparin.  Her TNKase was stopped after her IR procedure yesterday.  Her heparin level remains within goal range without signs/symptoms of bleeding. She remains anemic but CBC is relatively stable.   Goal of Therapy:  Heparin level 0.3-0.7 units/ml Monitor platelets by anticoagulation protocol: Yes   Plan:  1. Continue IV heparin at1900 units/hr. 2. Check AM Heparin level and CBC  3. F/u restart of coumadin  Lysle Pearl, PharmD, BCPS Pager # [phone] 05/25/2012 8:35 AM

## 2012-05-25 NOTE — Progress Notes (Signed)
MEDICATION RELATED CONSULT NOTE - INITIAL   Pharmacy Consult for iron replacement Indication: anemia  Allergies  Allergen Reactions  . Prednisone Other (See Comments)    No energy "stalls out"  . Tramadol Other (See Comments)    constipation    Patient Measurements: Height: 5\' 5"  (165.1 cm) Weight: 167 lb 8.8 oz (76 kg) IBW/kg (Calculated) : 57    Vital Signs: Temp: 97.8 F (36.6 C) (10/08 1100) Temp src: Oral (10/08 0800) BP: 122/58 mmHg (10/08 1300) Pulse Rate: 107  (10/08 0500) Intake/Output from previous day: 10/07 0701 - 10/08 0700 In: 2575.8 [I.V.:2425.8; IV Piggyback:150] Out: 2700 [Urine:2700] Intake/Output from this shift: Total I/O In: 1363 [P.O.:480; I.V.:883] Out: 675 [Urine:675]  Labs:  [facility] 05/25/12 0515 05/23/12 0500 05/22/12 1800  WBC 9.4 10.9* 10.8*  HGB 7.7* 8.3* 8.0*  HCT 23.5* 25.3* 24.7*  PLT 412* 350 374  APTT -- -- --  CREATININE -- 0.49* --  LABCREA -- -- --  CREATININE -- 0.49* --  [id number] -- -- --  MG -- -- --  PHOS -- -- --  ALBUMIN -- -- --  PROT -- -- --  ALBUMIN -- -- --  AST -- -- --  ALT -- -- --  ALKPHOS -- -- --  BILITOT -- -- --  BILIDIR -- -- --  IBILI -- -- --   Estimated Creatinine Clearance: 79.1 ml/min (by C-G formula based on Cr of 0.49).   Microbiology: Recent Results (from the past 720 hour(s))  MRSA PCR SCREENING     Status: Normal   Collection Time   05/18/12  3:48 PM      Component Value Range Status Comment   MRSA by PCR NEGATIVE  NEGATIVE Final   URINE CULTURE     Status: Normal   Collection Time   05/18/12 11:24 PM      Component Value Range Status Comment   Specimen Description URINE, CLEAN CATCH   Final    Special Requests NONE   Final    Culture  Setup Time 05/19/2012 01:00   Final    Colony Count NO GROWTH   Final    Culture NO GROWTH   Final    Report Status 05/19/2012 FINAL   Final   URINE CULTURE     Status: Normal   Collection Time   05/22/12  2:54 AM      Component Value  Range Status Comment   Specimen Description URINE, RANDOM   Final    Special Requests NONE   Final    Culture  Setup Time 05/22/2012 12:51   Final    Colony Count 50,000 COLONIES/ML   Final    Culture     Final    Value: Multiple bacterial morphotypes present, none predominant. Suggest appropriate recollection if clinically indicated.   Report Status 05/23/2012 FINAL   Final     Medical History: Past Medical History  Diagnosis Date  . DVT (deep venous thrombosis)   . Pulmonary embolism   . Back pain     Assessment: 56 year old female known to pharmacy for anticoagulation dosing. Orders to give IV iron replacement. Assuming target hgb of 14 and LBW of 52kg. Calculated iron deficiency of 1400mg . Patient tolerated iron dextrane test dose well  Plan:  Continue with Iron 1400mg  IV iron dextran - will split into two days to complete infusion. No other follow up warranted  Severiano Gilbert 05/25/2012,2:39 PM

## 2012-05-25 NOTE — Progress Notes (Signed)
SUBJECTIVE:  Pt without complaints.  Has no bleeding. Chart reviewed. Marland Kitchen   MEDICATIONS:  Scheduled:   . antiseptic oral rinse  15 mL Mouth Rinse BID  . cyanocobalamin  1,000 mcg Subcutaneous QPC supper  . docusate sodium  100 mg Oral BID  . pantoprazole (PROTONIX) IV  40 mg Intravenous Q12H  . senna  2 tablet Oral Daily  . sodium chloride  3 mL Intravenous Q12H   Continuous:   . sodium chloride 100 mL/hr (05/25/12 1610)  . heparin 1,900 Units/hr (05/25/12 1034)  . DISCONTD: sodium chloride 50 mL/hr at 05/24/12 0800  . DISCONTD: sodium chloride 30 mL/hr at 05/23/12 0800  . DISCONTD: sodium chloride 30 mL/hr at 05/22/12 1300  . DISCONTD: tenecteplase (TNKase) infusion Stopped (05/24/12 1630)  . DISCONTD: tenecteplase (TNKase) infusion Stopped (05/24/12 1630)   RUE:AVWUJW chloride, acetaminophen, acetaminophen, albuterol, alum & mag hydroxide-simeth, fentaNYL, HYDROcodone-acetaminophen, HYDROmorphone (DILAUDID) injection, iohexol, midazolam, midazolam, ondansetron (ZOFRAN) IV, ondansetron, sodium chloride   PHYSICAL EXAM  Vital signs in last 24 hours: Temp:  [97.6 F (36.4 C)-98.6 F (37 C)] 97.7 F (36.5 C) (10/08 0800) Pulse Rate:  [101-122] 107  (10/08 0500) Resp:  [12-21] 18  (10/08 0800) BP: (108-153)/(55-89) 112/56 mmHg (10/08 1000) SpO2:  [90 %-100 %] 99 % (10/08 0800)  Intake/Output from previous day: 10/07 0701 - 10/08 0700 In: 2575.8 [I.V.:2425.8; IV Piggyback:150] Out: 2700 [Urine:2700] Intake/Output this shift: Total I/O In: 357 [I.V.:357] Out: 375 [Urine:375]  General appearance: alert and cooperative Resp: clear to auscultation bilaterally and normal percussion bilaterally Cardio: regular rate and rhythm, S1, S2 normal, no murmur, click, rub or gallop GI: soft, non-tender; bowel sounds normal; no masses,  no organomegaly Extremities: edema involving both lower extremities.  Pulses present and symmetrical.  Skin: No petechiae.    LABS:  CBC      Component Value Date/Time   WBC 9.4 05/25/2012 0515   RBC 2.84* 05/25/2012 0515   HGB 7.7* 05/25/2012 0515   HCT 23.5* 05/25/2012 0515   PLT 412* 05/25/2012 0515   MCV 82.7 05/25/2012 0515   MCH 27.1 05/25/2012 0515   MCHC 32.8 05/25/2012 0515   RDW 16.6* 05/25/2012 0515   LYMPHSABS 1.2 05/18/2012 1113   MONOABS 1.4* 05/18/2012 1113   EOSABS 0.0 05/18/2012 1113   BASOSABS 0.0 05/18/2012 1113     Basename 05/23/12 0500  NA 135  K 3.1*  CL 102  CO2 19  GLUCOSE 78  BUN 3*  CREATININE 0.49*  CALCIUM 8.3*     XRAYS/RESULTS: Ir Rande Lawman F/u Eval Art/ven Basil Dess Day (ms)  05/23/2012  *RADIOLOGY REPORT*  Clinical Data: Massive bilateral iliofemoral DVT and IVC thrombosis.  Status post initiation of transcatheter thrombolytic therapy via bilateral lower extremity access on 05/21/2012.  The patient is now approximately 45 hours into thrombolytic infusion.  FOLLOW-UP ANGIOGRAPHY DURING THROMBOLYTIC INFUSION THERAPY  Comparison:  05/22/2012 and 05/21/2012.  Sedation: 2.0 mg IV Versed; 100 mcg IV Fentanyl.  Total Moderate Sedation Time: 26 minutes.  Contrast:  50 ml Omnipaque-300  Fluoroscopy Time: 4.2 minutes.  Procedure:  The procedure, risks, benefits, and alternatives were explained to the patient.  Questions regarding the procedure were encouraged and answered.  The patient understands and consents to the procedure.  The bilateral popliteal sheaths and infusion catheters were prepped with Betadine in a sterile fashion, and a sterile drape was applied covering the operative field.  A sterile gown and sterile gloves were used for the procedure. Local anesthesia was provided with 1%  Lidocaine.  Bilateral lower extremity, iliac vein and IVC venography was performed via preexisting infusion catheters as well as popliteal sheaths.  Bilateral infusion catheters were removed over guidewires.  The left popliteal sheath was exchanged for a new 7- [place] sheath.  A new 5-French, 90-cm length infusion catheters with  distal infusion length of 40 cm were advanced over guidewires and positioned under fluoroscopy.  These were connected to a Tenecteplase thrombolytic infusion at a dose of 0.15 mg per hour via each catheter.  Complications: None  Findings: Interval treatment response present primarily at the level of the iliac veins and IVC with slightly improved antegrade flow present.  There remains a substantial amount of chronic appearing thrombus in the lower IVC segment as well as bilateral iliac veins.  Patency of the lower extremity deep veins is relatively stable since yesterday's study with residual chronic femoral clot noted in the thighs bilaterally.  Subjectively, there is increased filling of collateral veins in both legs today compared to yesterday's venogram.  Iliac veins in the lower segment of the IVC will likely require extensive angioplasty and reconstruction with stents to maintain patency.  Larger caliber stents for IVC and iliac vein reconstruction will be available tomorrow.  The plan will be to continue overnight infusion of Tenecteplase at a lower dose of 0.15 mg per hour via each catheter.  Fibrinogen will be followed closely.  Completion of lytic therapy will be targeted for tomorrow.  IMPRESSION: Interval improved flow in the iliac veins and lower segment of the IVC with continued thrombolytic therapy.  There also is subjectively increased collateral venous flow in the lower extremities bilaterally.  As above, Tenecteplase infusion will be continued at a lower dose overnight with plans to complete lytic therapy tomorrow.  Extensive venous angioplasty and probable stent placement will likely be performed tomorrow along with possibly additional mechanical thrombectomy.   Original Report Authenticated By: Reola Calkins, M.D.      ASSESSMENT and PLAN: 1. Extensive thrombosis secondary to antiphospholipid antibody syndrome (lupus anticoagulant positive however this may be transient). Patient is s/p  IVC filter placement, s/p angioplasty, thrombolysis and mechanical thrombectomy, s/p IVC and bilateral lower extremity stent placement.  Still has chronic clot in IVC and bilateral lower extremities. My preference is to continue with gtt heparin for now and at time of discharge switch to lovenox.  Coumadin may be initiated as an outpatient depending on how well patient does.  The INR goal will be between 3.0 and 3.5.  Anticoagulation is indefinite.   2. Iron deficiency anemia - Administer IV dextran. Pharmacy to dose.     Arlan Organ I., MD 05/25/2012

## 2012-05-26 DIAGNOSIS — Q762 Congenital spondylolisthesis: Secondary | ICD-10-CM

## 2012-05-26 DIAGNOSIS — I2699 Other pulmonary embolism without acute cor pulmonale: Secondary | ICD-10-CM

## 2012-05-26 DIAGNOSIS — R5381 Other malaise: Secondary | ICD-10-CM

## 2012-05-26 DIAGNOSIS — M47817 Spondylosis without myelopathy or radiculopathy, lumbosacral region: Secondary | ICD-10-CM

## 2012-05-26 LAB — CBC
HCT: 22.5 % — ABNORMAL LOW (ref 36.0–46.0)
HCT: 22.9 % — ABNORMAL LOW (ref 36.0–46.0)
Hemoglobin: 7.4 g/dL — ABNORMAL LOW (ref 12.0–15.0)
Hemoglobin: 7.6 g/dL — ABNORMAL LOW (ref 12.0–15.0)
MCH: 26.7 pg (ref 26.0–34.0)
MCH: 27.3 pg (ref 26.0–34.0)
MCHC: 32.9 g/dL (ref 30.0–36.0)
MCHC: 33.2 g/dL (ref 30.0–36.0)
MCV: 81.2 fL (ref 78.0–100.0)
MCV: 82.4 fL (ref 78.0–100.0)
Platelets: 459 10*3/uL — ABNORMAL HIGH (ref 150–400)
Platelets: 457 10*3/uL — ABNORMAL HIGH (ref 150–400)
RBC: 2.77 MIL/uL — ABNORMAL LOW (ref 3.87–5.11)
RBC: 2.78 MIL/uL — ABNORMAL LOW (ref 3.87–5.11)
RDW: 16.6 % — ABNORMAL HIGH (ref 11.5–15.5)
RDW: 16.7 % — ABNORMAL HIGH (ref 11.5–15.5)
WBC: 7.1 10*3/uL (ref 4.0–10.5)
WBC: 7 10*3/uL (ref 4.0–10.5)

## 2012-05-26 LAB — HEPARIN LEVEL (UNFRACTIONATED): Heparin Unfractionated: 0.42 IU/mL (ref 0.30–0.70)

## 2012-05-26 MED ORDER — FUROSEMIDE 20 MG PO TABS
20.0000 mg | ORAL_TABLET | Freq: Every day | ORAL | Status: DC
  Administered 2012-05-26 – 2012-05-27 (×2): 20 mg via ORAL
  Filled 2012-05-26 (×2): qty 1

## 2012-05-26 NOTE — Consult Note (Signed)
Physical Medicine and Rehabilitation Consult Reason for Consult: Deconditioning/pulmonary emboli/DVT Referring Physician: [facility]   HPI: [name] [name] is a 56 y.o. right-handed female with history of spinal fusion surgery at [facility] 03/26/2012, subsequently had left leg DVT with bilateral pulmonary embolism the first week of September and was discharged on Lovenox and Coumadin on 04/23/2012. Patient to follow up with her PCP was noted INR of 2.4. She developed nausea vomiting as well as bright red blood per rectum as well as developing pain to the right lower extremity. In the workup showed hemoglobin 6.9 with guaiac stool positive, hypotensive and orthostatic with lowest blood pressure 91/59 and tachycardic as well as leukocytosis 21,400. She was admitted 05/18/2012 for ongoing evaluation. Venous Doppler studies right lower extremity showed deep vein thrombosis right saphenofemoral junction, common femoral, femoral, popliteal and posterior tibial vein. Underwent placement of IVC filter, status post angioplasty, from the lysis and mechanical thrombectomy bilateral lower extremity stent placement 05/23/2012 per interventional radiology. Hematology consult at and request for hypercoagulation workup as concerned that patient developed DVT and pulmonary emboli in setting of therapeutic INR on Coumadin now with IVC filter. Dr. Dalene Carrow hematology services for lupus anticoagulant positive and presently advised to continue heparin drip and consider initiating Coumadin as an outpatient. Goal INR that time we 3.0-3.5. Followup in gastroenterology for anemia with EGD colonoscopy 05/19/2012 that was unremarkable except for some internal and external hemorrhoids. There was a rather deep anal fissure which was felt to be the source of her hematochezia but no evidence of blood in the colon. Occupational therapy evaluation completed 05/25/2012 physical therapy evaluation pending recommendations are made for physical  medicine rehabilitation consult to consider inpatient rehabilitation services   Review of Systems  Respiratory: Positive for shortness of breath.   Cardiovascular: Positive for leg swelling.  Gastrointestinal: Positive for nausea and vomiting.  Musculoskeletal: Positive for back pain.  All other systems reviewed and are negative.   Past Medical History  Diagnosis Date  . DVT (deep venous thrombosis)   . Pulmonary embolism   . Back pain    Past Surgical History  Procedure Date  . Back surgery   . Colonoscopy 05/19/2012    Procedure: COLONOSCOPY;  Surgeon: Theda Belfast, MD;  Location: [facility] ENDOSCOPY;  Service: Endoscopy;  Laterality: N/A;  . Esophagogastroduodenoscopy 05/19/2012    Procedure: ESOPHAGOGASTRODUODENOSCOPY (EGD);  Surgeon: Theda Belfast, MD;  Location: [facility] ENDOSCOPY;  Service: Endoscopy;  Laterality: N/A;   Family History  Problem Relation Age of Onset  . Leukemia Mother   . Prostate cancer Father    Social History:  reports that she has been smoking.  She does not have any smokeless tobacco history on file. She reports that she drinks alcohol. She reports that she does not use illicit drugs. Allergies:  Allergies  Allergen Reactions  . Prednisone Other (See Comments)    No energy "stalls out"  . Tramadol Other (See Comments)    constipation   Medications Prior to Admission  Medication Sig Dispense Refill  . bisacodyl (DULCOLAX) 5 MG EC tablet Take 15 mg by mouth daily as needed. constipation      . lisinopril-hydrochlorothiazide (PRINZIDE,ZESTORETIC) 20-12.5 MG per tablet Take 0.5 tablets by mouth daily as needed. If blood pressure greater than 135/90      . oxyCODONE-acetaminophen (PERCOCET/ROXICET) 5-325 MG per tablet Take 2 tablets by mouth every 6 (six) hours as needed. pain      . sennosides-docusate sodium (SENOKOT-S) 8.6-50 MG tablet Take 2 tablets by  mouth every evening.      . warfarin (COUMADIN) 2 MG tablet Take 2 mg by mouth daily.         Home: Home Living Lives With: Spouse Available Help at Discharge: Available PRN/intermittently;Other (Comment) (spouse works) Type of Home: House Home Access: Level entry Home Layout: One level Bathroom Shower/Tub: Health visitor: Pharmacist, community: Yes How Accessible: Accessible via walker Home Adaptive Equipment: None  Functional History: Prior Function Driving: Yes Vocation: Full time employment Functional Status:  Mobility: Bed Mobility Bed Mobility: Rolling Left;Left Sidelying to Sit Rolling Left: With rail;4: Min assist Left Sidelying to Sit: 4: Min assist;With rails;HOB flat Transfers Sit to Stand: Without upper extremity assist;From bed;3: Mod assist      ADL: ADL Eating/Feeding: Simulated;Independent Where Assessed - Eating/Feeding: Edge of bed Grooming: Simulated;Set up Where Assessed - Grooming: Unsupported sitting Upper Body Bathing: Simulated;Set up Where Assessed - Upper Body Bathing: Unsupported sitting Lower Body Bathing: Simulated;Moderate assistance Where Assessed - Lower Body Bathing: Supported sit to stand Upper Body Dressing: Simulated;Set up Where Assessed - Upper Body Dressing: Unsupported sitting Lower Body Dressing: Simulated;Maximal assistance Where Assessed - Lower Body Dressing: Supported sit to stand Toilet Transfer: Performed;Minimal assistance Toilet Transfer Method: Other (comment) (ambulate with hand held assist to 3:1) Acupuncturist: Bedside commode Tub/Shower Transfer Method: Not assessed Transfers/Ambulation Related to ADLs: Pt able to perform stand pivot transfers from bed with mod assist and from bedside commode with min assist. ADL Comments: Pt with decreased ability to reach her feet for simulated selfcare and toileting.  Will likely benfit from AE education.  Husband works during the day so pt needs to be modified independent for toileting and short distance  mobility.  Cognition: Cognition Arousal/Alertness: Awake/alert Orientation Level: Oriented X4 Cognition Overall Cognitive Status: Appears within functional limits for tasks assessed/performed Arousal/Alertness: Awake/alert Orientation Level: Appears intact for tasks assessed Behavior During Session: [facility] for tasks performed  Blood pressure 102/64, pulse 101, temperature 98.2 F (36.8 C), temperature source Oral, resp. rate 19, height 5\' 5"  (1.651 m), weight 76 kg (167 lb 8.8 oz), SpO2 95.00%. Physical Exam  Vitals reviewed. Constitutional: She is oriented to person, place, and time. She appears well-developed.  HENT:  Head: Normocephalic.  Eyes:       Pupils round reactive to light  Neck: Neck supple. No thyromegaly present.  Cardiovascular: Normal rate and regular rhythm.   Pulmonary/Chest: Effort normal and breath sounds normal. No respiratory distress. She has no wheezes.  Abdominal: Soft. Bowel sounds are normal. She exhibits no distension. There is no tenderness.  Musculoskeletal: She exhibits edema.       +1 edema lower extremities  Neurological: She is alert and oriented to person, place, and time. No cranial nerve deficit or sensory deficit.       Pt with weakness in both legs, grossly 2/5. Edema certainly affects hip flexion.  Skin:       Back incision is healed  Psychiatric: She has a normal mood and affect. Her behavior is normal. Judgment and thought content normal.    No results found for this or any previous visit (from the past 24 hour(s)). No results found.  Assessment/Plan: Diagnosis: recent back surgery with DVT/PE, multiple medical and deconditioning 1. Does the need for close, 24 hr/day medical supervision in concert with the patient's rehab needs make it unreasonable for this patient to be served in a less intensive setting? Yes 2. Co-Morbidities requiring supervision/potential complications: htn, GIB, ABLAa, hypercoagulable state  3. Due to bladder  management, bowel management, safety, skin/wound care, disease management, medication administration, pain management and patient education, does the patient require 24 hr/day rehab nursing? Yes 4. Does the patient require coordinated care of a physician, rehab nurse, PT (1-2 hrs/day, 5 days/week) and OT (1-2 hrs/day, 5 days/week) to address physical and functional deficits in the context of the above medical diagnosis(es)? Yes Addressing deficits in the following areas: balance, endurance, locomotion, strength, transferring, bowel/bladder control, bathing, dressing, feeding, grooming, toileting and psychosocial support 5. Can the patient actively participate in an intensive therapy program of at least 3 hrs of therapy per day at least 5 days per week? Yes 6. The potential for patient to make measurable gains while on inpatient rehab is excellent 7. Anticipated functional outcomes upon discharge from inpatient rehab are supervision to mod I with PT, mod I to min assist with OT, n/a with SLP. 8. Estimated rehab length of stay to reach the above functional goals is: 1-2 weeks 9. Does the patient have adequate social supports to accommodate these discharge functional goals? Yes 10. Anticipated D/C setting: Home 11. Anticipated post D/C treatments: HH therapy 12. Overall Rehab/Functional Prognosis: excellent  RECOMMENDATIONS: This patient's condition is appropriate for continued rehabilitative care in the following setting: CIR Patient has agreed to participate in recommended program. Yes Note that insurance prior authorization may be required for reimbursement for recommended care.  Comment:Rehab RN to follow up.   Ivory Broad, MD     05/26/2012

## 2012-05-26 NOTE — Progress Notes (Signed)
Pharmacy Consult-Anticoagulation  Pharmacy Consult:  56 y/o female with Hypercoagulation syndrome/thrombocytosis who redeveloped DVT and PE in spite of therapeutic INR on Coumadin.  Patient placed on Heparin per protocol. Etiology felt to be related to antiphospholipid antibody syndrome.  Lupus anticoagulant positive but  may be transient as per Hematology consult.  Current Labs: Hematology  Basename 05/26/12 0525 05/25/12 0515 05/24/12 2134  HGB 7.4* 7.7* --  HCT 22.5* 23.5* --  PLT 459* 412* --  APTT -- -- --  LABPROT -- -- --  INR -- -- --  HEPARINUNFRC 0.42 0.41 0.49  CREATININE -- -- --  CKTOTAL -- -- --  CKMB -- -- --  TROPONINI -- -- --   Lab Results  Component Value Date   INR 2.19* 05/21/2012   INR 2.13* 05/21/2012   INR 2.29* 05/21/2012    Estimated Creatinine Clearance: 79.1 ml/min (by C-G formula based on Cr of 0.49).  Current Meds:    antiseptic oral rinse 15 mL Mouth Rinse BID  cyanocobalamin 1,000 mcg Subcutaneous QPC supper  docusate sodium 100 mg Oral BID  iron dextran (INFED/DEXFERRUM) infusion 1,000 mg Intravenous Once  iron dextran (INFED/DEXFERRUM) infusion 25 mg Intravenous Once  iron dextran (INFED/DEXFERRUM) infusion 400 mg Intravenous Once  pantoprazole (PROTONIX) IV 40 mg Intravenous Q12H  senna 2 tablet Oral Daily  sodium chloride 3 mL Intravenous Q12H    Assessment:  56 y/o female with Hypercoagulation syndrome/thrombocytosis who redeveloped DVT and PE in spite of therapeutic INR on Coumadin.  Etiology felt to be related to antiphospholipid antibody syndrome.  Lupus anticoagulant positive but  may be transient as per Hematology consult.  S/p IVC filter 10/7 13.  Heparin infusing at 1900 units/hr.  Heparin level therapeutic 0.42 units/ml.  H/H remains low but no bleeding complications identified.  Patient receiving IV iron replacement today.  Goal/Plan:  Heparin goal is 0.3 - 0.7.  Heparin will be continued at same rate.  Next Heparin  level due tomorrow with daily labs.  Daily Heparin level and CBC while on Heparin.  Kira Hartl, Elisha Headland, Pharm.D. 05/26/2012  9:47 AM

## 2012-05-26 NOTE — Progress Notes (Signed)
[facility] PROGRESS NOTE  [name] [name] ZOX:[id number] DOB: 03-10-1956 DOA: 05/18/2012 PCP: Rudi Heap, MD  Assessment/Plan: Hypercoagulation syndrome/thrombocytosis  *Very concerning since redeveloped DVT and PE in setting of therapeutic INR on Coumadin  * Etiology felt to be related to antiphospholipid antibody syndrome-lupus anticoagulant positive but hematology feels this may be transient.  *Because of recurrent PE an IVC filter was recommended and during insertion of this filter it was noted that this patient had extensive near occlusive thrombus extending to the IVC with nonvisualization of the left renal vein.  *Appreciate hematology assistance. Will follow recommendations to keep on heparin drip until discharge, then switch to full dose lovenox at DC till FU with Hematology at Outpatient *CT of the abdomen and pelvis shows no gross signs of malignancy.   DVT, recurrent, lower extremity, acute- history of LLE DVT 04/2012/associated significant lower extremity edema:  A) status post mechanical thrombectomy  B) angioplasty and stent placement and  C) transcatheter thrombolysis infusion  D) status post IVC filter placement  *Appreciate interventional radiology assistance  *After procedure on 05/26/2012 interventional radiology notes chronic clot remains-please see the procedure note  *Hematology is recommending to continue heparin until time of discharge then to transition to Lovenox and continue Lovenox after discharge consideration for initiation of Coumadin after outpatient followup in the hematology clinic  *Continue thigh-high compression stockings with a 20-30 compression strength  *Patient is to followup in the interventional radiology clinic 3 weeks after discharge-discussion regarding removal of IVC filter will be performed at clinic visit  Bilateral LEE slightly worsened. Volume status +10L.May be contributing to edema. Will start low dose lasix and monitor.   Fever    *resolved, possibly from DVT  *Patient initially with leukocytosis but no fever which is likely reactive and related to extensive thrombosis. As a precaution the patient was started on broad-spectrum antibiotics. No definitive infection or abscess seen on CT abdomen and pelvis.  * Antibiotics were discontinued 10/6, (no fevers or reason to continue)   Acute blood loss anemia  *Review of the laboratory data reveals that when patient presented initially in September hemoglobin was around 12 and at time of discharge hemoglobin had decreased to 7.4- suspect it is mostly related to consumption  *Patient has currently received 2 units of packed red blood cells this admission and hemoglobin stable  *Continue to follow CBC and transfuse as needed.  Hg 7.4 05/26/12/ will recheck in afternoon and consider transfusion if trends down.  monitor.   Lower GI bleed likely sec to anal fissure  *Appreciate gastroenterology assistance  *EGD and colonoscopy completed 05/19/2012 and only revealed nonbleeding anal fissure  *Patient also endorsed a history of GERD to the gastroenterologist  Tolerating diet well.   Iron deficiency  *Likely related to chronic ongoing blood loss exacerbated by recent issues of poor nutrition. Patient and husband endorses patient has really not eaten much for at least 3 weeks  *Iron was less than 10 on anemia panel this admission.  *Hematology has ordered IV dextran with pharmacy to dose . Infusion 05/26/12  Pulmonary embolism, bilateral- moderate clot burden  *This is recurrent and in the setting of therapeutic INR on Coumadin  *Status post IVC filter placement  *Currently her pulmonary status is stable and she is on room air  *She is also hemodynamically stable.   Tobacco abuse  *Counseled regarding cessation - an absolute must   HTN (hypertension)  *Blood pressure well controlled SBP range 102-132. *Home ACE inhibitor with thiazide diuretic currently on  hold   History of  back surgery/leukocytosis  *Has been evaluated by neurosurgery this admission. Dr. [name] Dess has also reviewed the CT scan and discussed results with Dr. Sharon Seller. He mentioned abnormality regarding one of the screws in relationship to left common iliac vein is not an uncommon finding and Dr. [name] Dess does not feel that this has contributed to the patient's development of DVT  *Dr. Janee Morn at [facility] performed the initial surgery in August 2013   Hyponatremia/hypokalemia  *Resolved  *Suspect at this point primarily related to bowel prep so we'll follow electrolyte panel and when can take oral we'll replete with combination of IV and oral medication   Constipation, chronic  *Likely exacerbated by recent narcotic usage and suspect has contributed to patient's GI symptomatology in regards to nausea and vomiting  *Currently should be adequately treated after bowel prep but will need to begin preventative measures once diet resumed  *started on docusate scheduled BID, will also schedule senna daily.   B12 deficiency  *Begin SQ load - will need to be followed in outpt setting     Code Status: full Family Communication: pt at bedside Disposition Plan: rehab likely needed. Being evaluated for [facility] in patient rehab   Consultants: Hematology  Pulmonology  Gastroenterology  Interventional radiology  Inpatient rehabilitation team  Procedures: Placement of supra-renal IVC filter on 05/18/2012 interventional radiology  EGD and colonoscopy by Dr. Elnoria Howard pending on 05/19/2012  Bilateral lower extremity catheter placement with subsequent TNK infusion  Angioplasty with mechanical thrombectomy and stenting of lower IVC and bilateral iliac veins on 05/24/2012 per interventional radiology  Antibiotics: Zosyn stopped 10/6   HPI/Subjective: Sitting up in chair watching TV. Denies pain/discomfort. Unable to bend knees and foot much due to swelling  Objective: Filed Vitals:    05/25/12 1800 05/25/12 1859 05/25/12 1952 05/26/12 0418  BP: 132/64 133/64 131/54 102/64  Pulse:   102 101  Temp:  98 F (36.7 C) 98 F (36.7 C) 98.2 F (36.8 C)  TempSrc:  Oral Oral Oral  Resp:   20 19  Height:      Weight:      SpO2:  99% 100% 95%    Intake/Output Summary (Last 24 hours) at 05/26/12 1159 Last data filed at 05/26/12 0900  Gross per 24 hour  Intake   1878 ml  Output   1675 ml  Net    203 ml   Filed Weights   05/18/12 1040 05/18/12 1530 05/19/12 0730  Weight: 65.772 kg (145 lb) 72.1 kg (158 lb 15.2 oz) 76 kg (167 lb 8.8 oz)    Exam:   General:  Slightly pale, NAD, alert  Cardiovascular: tachycardic but regular. No MGR. 2-3+LEE up to groin area. Thigh high compression stockings in place. Toes warm to touch bilaterally. PPP  Respiratory: normal effort. BSCTAB No wheeze. No rhonchi  Abdomen: obese, soft +BS non tender.   Ext: 2 plus edema, compression stockings on  Data Reviewed: Basic Metabolic Panel:  Lab 05/23/12 1308 05/21/12 0445 05/20/12 0200  NA 135 132* 134*  K 3.1* 3.7 2.9*  CL 102 100 99  CO2 19 21 23   GLUCOSE 78 98 105*  BUN 3* 5* 6  CREATININE 0.49* 0.47* 0.49*  CALCIUM 8.3* 8.3* 8.3*  MG -- -- --  PHOS -- -- --   Liver Function Tests:  Lab 05/21/12 0445 05/20/12 0200  AST 27 24  ALT 18 14  ALKPHOS 274* 176*  BILITOT 0.4 0.7  PROT  5.6* 5.7*  ALBUMIN 1.4* 1.5*   No results found for this basename: LIPASE:5,AMYLASE:5 in the last 168 hours No results found for this basename: AMMONIA:5 in the last 168 hours CBC:  Lab 05/26/12 0525 05/25/12 0515 05/23/12 0500 05/22/12 1800 05/22/12 0500  WBC 7.1 9.4 10.9* 10.8* 10.5  NEUTROABS -- -- -- -- --  HGB 7.4* 7.7* 8.3* 8.0* 7.9*  HCT 22.5* 23.5* 25.3* 24.7* 24.5*  MCV 81.2 82.7 84.3 83.7 84.2  PLT 459* 412* 350 374 426*   Cardiac Enzymes: No results found for this basename: CKTOTAL:5,CKMB:5,CKMBINDEX:5,TROPONINI:5 in the last 168 hours BNP (last 3 results)  Basename 04/21/12  0106  PROBNP 292.5*   CBG: No results found for this basename: GLUCAP:5 in the last 168 hours  Recent Results (from the past 240 hour(s))  MRSA PCR SCREENING     Status: Normal   Collection Time   05/18/12  3:48 PM      Component Value Range Status Comment   MRSA by PCR NEGATIVE  NEGATIVE Final   URINE CULTURE     Status: Normal   Collection Time   05/18/12 11:24 PM      Component Value Range Status Comment   Specimen Description URINE, CLEAN CATCH   Final    Special Requests NONE   Final    Culture  Setup Time 05/19/2012 01:00   Final    Colony Count NO GROWTH   Final    Culture NO GROWTH   Final    Report Status 05/19/2012 FINAL   Final   URINE CULTURE     Status: Normal   Collection Time   05/22/12  2:54 AM      Component Value Range Status Comment   Specimen Description URINE, RANDOM   Final    Special Requests NONE   Final    Culture  Setup Time 05/22/2012 12:51   Final    Colony Count 50,000 COLONIES/ML   Final    Culture     Final    Value: Multiple bacterial morphotypes present, none predominant. Suggest appropriate recollection if clinically indicated.   Report Status 05/23/2012 FINAL   Final      Studies: No results found.  Scheduled Meds:   . antiseptic oral rinse  15 mL Mouth Rinse BID  . cyanocobalamin  1,000 mcg Subcutaneous QPC supper  . docusate sodium  100 mg Oral BID  . iron dextran (INFED/DEXFERRUM) infusion  1,000 mg Intravenous Once  . iron dextran (INFED/DEXFERRUM) infusion  25 mg Intravenous Once  . iron dextran (INFED/DEXFERRUM) infusion  400 mg Intravenous Once  . pantoprazole (PROTONIX) IV  40 mg Intravenous Q12H  . senna  2 tablet Oral Daily  . sodium chloride  3 mL Intravenous Q12H   Continuous Infusions:   . sodium chloride 10 mL/hr (05/25/12 1430)  . heparin 1,900 Units/hr (05/26/12 0325)    Principal Problem:  *Acute blood loss anemia Active Problems:  Pulmonary embolism, bilateral- moderate clot burden  Tobacco abuse  HTN  (hypertension)  History of back surgery  Lower GI bleed  Hypercoagulation syndrome  Iron deficiency  Hyponatremia  Constipation, chronic  DVT, recurrent, lower extremity, acute- history of LLE DVT 04/2012  Thrombocytosis  Fever  Physical deconditioning    Time spent: 30 minutes    Gwenyth Bender NP [facility]  If 8PM-8AM, please contact night-coverage at [email or web address], password [facility] 05/26/2012, 11:59 AM  LOS: 8 days       Pt seen and examined Agree with  note above Add Po lasix, repeat CBC this afternoon and in am CIR consult Medically ready for DC  Zannie Cove, MD [phone]

## 2012-05-26 NOTE — Progress Notes (Signed)
Rehab Admissions Coordinator Note:  Patient was screened by Trish Mage for appropriateness for an Inpatient Acute Rehab Consult.  At this time, we are recommending Inpatient Rehab consult.  Inpatient rehab consult is pending completion today by rehab MD.  PT and OT recommending inpatient rehab.  Lelon Frohlich M 05/26/2012, 11:29 AM  I can be reached at [phone].

## 2012-05-26 NOTE — Evaluation (Signed)
Physical Therapy Evaluation Patient Details Name: [name] [name] MRN: [id number] DOB: [birth year] Today's Date: 05/26/2012 Time: 7829-5621 PT Time Calculation (min): 41 min  PT Assessment / Plan / Recommendation Clinical Impression  pt presents with Bil PEs, and L DVT, with recent back surgery at [facility].  pt's HR increased to 140's during ambulation per RN, pt without any symptoms.  pt very motivated to improve mobility and return to home.  Discussed D/C planning of CIR vs home with HHPT.  pt wanting to D/C home, but wants to be more independent prior to D/C.  pt would benefit from CIR pending progress.      PT Assessment  Patient needs continued PT services    Follow Up Recommendations  Post acute inpatient (vs HHPT pending pt's continued progress)    Does the patient have the potential to tolerate intense rehabilitation   Yes, Recommend IP Rehab Screening  Barriers to Discharge Decreased caregiver support      Equipment Recommendations  3 in 1 bedside comode;Tub/shower seat;Rolling walker with 5" wheels    Recommendations for Other Services     Frequency Min 3X/week    Precautions / Restrictions Precautions Precautions: Fall;Back Precaution Booklet Issued: No Precaution Comments: pt verbalized back precautions.   Restrictions Weight Bearing Restrictions: No   Pertinent Vitals/Pain Denies pain.  HR up to 140's during ambulation per RN.        Mobility  Bed Mobility Bed Mobility: Rolling Left;Left Sidelying to Sit Rolling Left: 5: Supervision;With rail Left Sidelying to Sit: 5: Supervision;With rails;HOB flat Details for Bed Mobility Assistance: cues for log roll.  pt "semi" log rolls then tries to sit straight up.   Transfers Transfers: Sit to Stand;Stand to Sit Sit to Stand: 4: Min assist;With upper extremity assist;From bed Stand to Sit: 4: Min guard;With upper extremity assist;To chair/3-in-1;With armrests Details for Transfer Assistance: cues for UE use and control  descent to chair.   Ambulation/Gait Ambulation/Gait Assistance: 4: Min guard Ambulation Distance (Feet): 80 Feet Assistive device: Rolling walker Ambulation/Gait Assistance Details: cues for use of RW, back precautions with turns.   Gait Pattern: Step-through pattern;Decreased stride length Stairs: No Wheelchair Mobility Wheelchair Mobility: No    Shoulder Instructions     Exercises     PT Diagnosis: Abnormality of gait  PT Problem List: Decreased activity tolerance;Decreased balance;Decreased mobility;Decreased knowledge of use of DME;Decreased knowledge of precautions;Cardiopulmonary status limiting activity PT Treatment Interventions: DME instruction;Gait training;Stair training;Functional mobility training;Therapeutic activities;Therapeutic exercise;Balance training;Patient/family education   PT Goals Acute Rehab PT Goals PT Goal Formulation: With patient Time For Goal Achievement: 06/09/12 Potential to Achieve Goals: Good Pt will Roll Supine to Right Side: with modified independence PT Goal: Rolling Supine to Right Side - Progress: Goal set today Pt will Roll Supine to Left Side: with modified independence PT Goal: Rolling Supine to Left Side - Progress: Goal set today Pt will go Supine/Side to Sit: with modified independence PT Goal: Supine/Side to Sit - Progress: Goal set today Pt will go Sit to Supine/Side: with modified independence PT Goal: Sit to Supine/Side - Progress: Goal set today Pt will go Sit to Stand: with modified independence PT Goal: Sit to Stand - Progress: Goal set today Pt will go Stand to Sit: with modified independence PT Goal: Stand to Sit - Progress: Goal set today Pt will Ambulate: >150 feet;with modified independence;with least restrictive assistive device PT Goal: Ambulate - Progress: Goal set today  Visit Information  Last PT Received On: 05/26/12 Assistance Needed: +1  Subjective Data  Subjective: Can I go home or do I have to go to  rehab?   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   Communication Communication: No difficulties Dominant Hand: Left    Cognition  Overall Cognitive Status: Appears within functional limits for tasks assessed/performed Arousal/Alertness: Awake/alert Orientation Level: Appears intact for tasks assessed Behavior During Session: [facility] for tasks performed    Extremity/Trunk Assessment Right Lower Extremity Assessment RLE ROM/Strength/Tone: WFL for tasks assessed RLE Sensation: WFL - Light Touch Left Lower Extremity Assessment LLE ROM/Strength/Tone: WFL for tasks assessed LLE Sensation: WFL - Light Touch Trunk Assessment Trunk Assessment: Normal   Balance Balance Balance Assessed: No  End of Session PT - End of Session Equipment Utilized During Treatment: Gait belt Activity Tolerance: Patient tolerated treatment well Patient left: in chair;with call bell/phone within reach Nurse Communication: Mobility status  GP     RitenourAlison Murray, [place] [phone] 05/26/2012, 9:28 AM

## 2012-05-27 ENCOUNTER — Inpatient Hospital Stay (HOSPITAL_COMMUNITY)
Admission: RE | Admit: 2012-05-27 | Discharge: 2012-05-31 | DRG: 462 | Disposition: A | Discharge status: Home / Self Care | Payer: BC Managed Care – PPO | Source: Ambulatory Visit | Attending: Physical Medicine & Rehabilitation | Admitting: Physical Medicine & Rehabilitation

## 2012-05-27 DIAGNOSIS — D649 Anemia, unspecified: Secondary | ICD-10-CM | POA: Diagnosis present

## 2012-05-27 DIAGNOSIS — M549 Dorsalgia, unspecified: Secondary | ICD-10-CM | POA: Diagnosis present

## 2012-05-27 DIAGNOSIS — Z5189 Encounter for other specified aftercare: Secondary | ICD-10-CM

## 2012-05-27 DIAGNOSIS — R5381 Other malaise: Secondary | ICD-10-CM | POA: Diagnosis present

## 2012-05-27 DIAGNOSIS — F17200 Nicotine dependence, unspecified, uncomplicated: Secondary | ICD-10-CM | POA: Diagnosis present

## 2012-05-27 DIAGNOSIS — E876 Hypokalemia: Secondary | ICD-10-CM | POA: Diagnosis present

## 2012-05-27 DIAGNOSIS — R0602 Shortness of breath: Secondary | ICD-10-CM | POA: Diagnosis present

## 2012-05-27 DIAGNOSIS — R29898 Other symptoms and signs involving the musculoskeletal system: Secondary | ICD-10-CM

## 2012-05-27 DIAGNOSIS — I2699 Other pulmonary embolism without acute cor pulmonale: Secondary | ICD-10-CM | POA: Diagnosis present

## 2012-05-27 DIAGNOSIS — R609 Edema, unspecified: Secondary | ICD-10-CM | POA: Diagnosis present

## 2012-05-27 DIAGNOSIS — R894 Abnormal immunological findings in specimens from other organs, systems and tissues: Secondary | ICD-10-CM | POA: Diagnosis present

## 2012-05-27 DIAGNOSIS — I82409 Acute embolism and thrombosis of unspecified deep veins of unspecified lower extremity: Secondary | ICD-10-CM | POA: Diagnosis present

## 2012-05-27 DIAGNOSIS — D509 Iron deficiency anemia, unspecified: Secondary | ICD-10-CM

## 2012-05-27 LAB — CBC
HCT: 21.8 % — ABNORMAL LOW (ref 36.0–46.0)
Hemoglobin: 7.3 g/dL — ABNORMAL LOW (ref 12.0–15.0)
MCH: 27.5 pg (ref 26.0–34.0)
MCHC: 33.5 g/dL (ref 30.0–36.0)
MCV: 82.3 fL (ref 78.0–100.0)
Platelets: 517 10*3/uL — ABNORMAL HIGH (ref 150–400)
RBC: 2.65 MIL/uL — ABNORMAL LOW (ref 3.87–5.11)
RDW: 17 % — ABNORMAL HIGH (ref 11.5–15.5)
WBC: 7.2 10*3/uL (ref 4.0–10.5)

## 2012-05-27 LAB — PREPARE RBC (CROSSMATCH)

## 2012-05-27 LAB — HEPARIN LEVEL (UNFRACTIONATED): Heparin Unfractionated: 0.41 IU/mL (ref 0.30–0.70)

## 2012-05-27 MED ORDER — ENOXAPARIN SODIUM 100 MG/ML ~~LOC~~ SOLN
75.0000 mg | Freq: Two times a day (BID) | SUBCUTANEOUS | Status: DC
  Administered 2012-05-27: 75 mg via SUBCUTANEOUS
  Filled 2012-05-27 (×2): qty 1

## 2012-05-27 MED ORDER — HYDROCODONE-ACETAMINOPHEN 5-325 MG PO TABS
1.0000 | ORAL_TABLET | ORAL | Status: DC | PRN
  Filled 2012-05-27: qty 2

## 2012-05-27 MED ORDER — BIOTENE DRY MOUTH MT LIQD
15.0000 mL | Freq: Two times a day (BID) | OROMUCOSAL | Status: DC
  Administered 2012-05-28 – 2012-05-31 (×7): 15 mL via OROMUCOSAL

## 2012-05-27 MED ORDER — ACETAMINOPHEN 325 MG PO TABS
325.0000 mg | ORAL_TABLET | ORAL | Status: DC | PRN
  Administered 2012-05-29 – 2012-05-30 (×6): 650 mg via ORAL
  Filled 2012-05-27 (×7): qty 2

## 2012-05-27 MED ORDER — PANTOPRAZOLE SODIUM 40 MG IV SOLR
40.0000 mg | Freq: Two times a day (BID) | INTRAVENOUS | Status: DC
  Filled 2012-05-27 (×4): qty 40

## 2012-05-27 MED ORDER — SORBITOL 70 % SOLN: 30.0000 mL | Freq: Every day | Status: DC | PRN

## 2012-05-27 MED ORDER — ONDANSETRON HCL 4 MG/2ML IJ SOLN: 4.0000 mg | Freq: Four times a day (QID) | INTRAMUSCULAR | Status: DC | PRN

## 2012-05-27 MED ORDER — ONDANSETRON HCL 4 MG PO TABS: 4.0000 mg | ORAL_TABLET | Freq: Four times a day (QID) | ORAL | Status: DC | PRN

## 2012-05-27 MED ORDER — FUROSEMIDE 20 MG PO TABS
20.0000 mg | ORAL_TABLET | Freq: Every day | ORAL | Status: DC
  Administered 2012-05-28: 20 mg via ORAL
  Filled 2012-05-27 (×3): qty 1

## 2012-05-27 MED ORDER — POLYETHYLENE GLYCOL 3350 17 G PO PACK
17.0000 g | PACK | Freq: Every day | ORAL | Status: DC | PRN
  Filled 2012-05-27: qty 1

## 2012-05-27 MED ORDER — SENNA 8.6 MG PO TABS
2.0000 | ORAL_TABLET | Freq: Every day | ORAL | Status: DC
  Administered 2012-05-28 – 2012-05-31 (×4): 17.2 mg via ORAL
  Filled 2012-05-27 (×3): qty 2
  Filled 2012-05-27: qty 1
  Filled 2012-05-27: qty 2
  Filled 2012-05-27: qty 1

## 2012-05-27 MED ORDER — ALBUTEROL SULFATE (5 MG/ML) 0.5% IN NEBU: 2.5000 mg | INHALATION_SOLUTION | RESPIRATORY_TRACT | Status: DC | PRN

## 2012-05-27 MED ORDER — ENOXAPARIN SODIUM 100 MG/ML ~~LOC~~ SOLN
75.0000 mg | Freq: Two times a day (BID) | SUBCUTANEOUS | Status: DC
  Administered 2012-05-27 – 2012-05-30 (×6): 75 mg via SUBCUTANEOUS
  Administered 2012-05-30: 100 mg via SUBCUTANEOUS
  Administered 2012-05-31: 75 mg via SUBCUTANEOUS
  Filled 2012-05-27 (×11): qty 1

## 2012-05-27 NOTE — Discharge Summary (Signed)
Physician Discharge Summary  Patient ID: Lyrique Hakim MRN: [id number] DOB/AGE: 1956/02/28 56 y.o.  Admit date: 05/18/2012 Discharge date: 05/27/2012  Primary Care Physician:  Rudi Heap, MD  Disposition and Follow-up:   Dr.Odogwu in 2 weeks Dr.Yamagata with IR in 3 weeks for IVC filter removal   Discharge Diagnoses:     DVT, recurrent, lower extremity, acute- history of LLE DVT 04/2012  Acute blood loss anemia  Pulmonary embolism, bilateral- moderate clot burden  Tobacco abuse  HTN (hypertension)  History of back surgery  Lower GI bleed resolved  Hypercoagulation syndrome  Iron deficiency  Hyponatremia  Constipation, chronic  Thrombocytosis  Fever  Physical deconditioning      Medication List  1.Lovenox 75mg  SQ Q12   2. Tylenol 325mg  PO Q6hours PRN  3. Lasix 20mg  po Daily, can be stopped in next 1-2 days  4. Senokot S 1 tab daily  5. Protonix 40mg  po BID  6. Vicodin 5/325mg  1 tab po Q4H PRN  7. Albuterol HFA 1-2 puffs Q4H PRN  8. Colace 100mg  PO BID  Disposition and Follow-up:   Dr.Odogwu in 2 weeks Dr.Yamagata with IR in 3 weeks for IVC filter removal  Consults:  1. Vascular surgery 2. IR, Dr.Yamagata 3. Hematology,  Dr.Odogwu 4. Gi Dr.Hung   Significant Diagnostic Studies:  No results found.  Brief H and P: Patient is a 56 year old female with a recent history of spinal fusion surgery in [facility] on August 19th, subsequently had left leg DVT with bilateral pulmonary embolism in first week of September and was discharged on Lovenox and Coumadin on 04/23/2012. Patient has been taking Coumadin, head followup with her PCP 2 weeks ago and INR was 2.4. In the last 3 days patient started having nausea and vomiting, unable to hold anything down. She also noticed bright red blood per rectum and thought it was probably from hemorrhoids on Wed and Thursday for 2 days, then she has been constipated after that. Then 2 days ago she started having pain  in her right leg and swelling, today felt dizzy and lightheaded, came to the ED.  In the workup showed hemoglobin of 6.9, guiac stool test positive, hypotensive and orthostatic with lowest blood pressure 91/59 and tachycardiac, thrombocytosis, leukocytosis of unclear etiology 21.4. INR is 2.29   Procedures:  Placement of supra-renal IVC filter on 05/18/2012 interventional radiology  EGD and colonoscopy by Dr. Elnoria Howard pending on 05/19/2012  Bilateral lower extremity catheter placement with subsequent TNK infusion  Angioplasty with mechanical thrombectomy and stenting of lower IVC and bilateral iliac veins on 05/24/2012 per interventional radiology   Hospital Course:  Hypercoagulation syndrome/thrombocytosis  *Very concerning since redeveloped DVT and PE in setting of therapeutic INR on Coumadin  * Etiology felt to be related to antiphospholipid antibody syndrome-lupus anticoagulant positive but hematology feels this may be transient.  *Because of recurrent PE an IVC filter was recommended and during insertion of this filter it was noted that this patient had extensive near occlusive thrombus extending to the IVC with nonvisualization of the left renal vein.  *Appreciate hematology assistance. Will Transition to Full dose lovenox per heme/Onc recs at DC, then FU with Hematology at Outpatient  *CT of the abdomen and pelvis shows no gross signs of malignancy.    DVT, recurrent, lower extremity, acute- history of LLE DVT 04/2012/associated significant lower extremity edema:  A) status post mechanical thrombectomy  B) angioplasty and stent placement and  C) transcatheter thrombolysis infusion  D) status post IVC filter placement  *  Appreciate interventional radiology assistance  *After procedure on 05/26/2012 interventional radiology notes chronic clot remains- *Hematology is recommending to continue heparin until time of discharge then to transition to Lovenox and continue Lovenox after discharge  consideration for initiation of Coumadin after outpatient followup in the hematology clinic  *Continue thigh-high compression stockings with a 20-30 compression strength  *Patient is to followup in the interventional radiology clinic 3 weeks after discharge-discussion regarding removal of IVC filter will be performed at clinic visit  Bilateral LEE some improvement. LE still edematous but softer. Volume status +8L.May be contributing to edema. Continue low dose lasix and monitor, responding well to diuresis , lasix can be stopped in 1-2 days  Fever  *resolved, possibly from DVT  *Patient initially with leukocytosis but no fever which is likely reactive and related to extensive thrombosis. As a precaution the patient was started on broad-spectrum antibiotics. No definitive infection or abscess seen on CT abdomen and pelvis.  * Antibiotics were discontinued 10/6, (no fevers or reason to continue)   Acute blood loss anemia  *Review of the laboratory data reveals that when patient presented initially in September hemoglobin was around 12 and at time of discharge hemoglobin had decreased to 7.4- suspect it is mostly related to consumption  *Patient has received 2 units of packed red blood cells this admission. On 10/10 7.3 with slight trend downward. transfused 1 unit PRBC today for total of 3 units since admission.  No further bleeding noted  Lower GI bleed likely sec to anal fissur  *Appreciate gastroenterology assistance  *EGD and colonoscopy completed 05/19/2012 and only revealed nonbleeding anal fissure  *Patient also endorsed a history of GERD to the gastroenterologist  Tolerating diet well.  No further Gi bleeding  Iron deficiency  *Likely related to chronic ongoing blood loss exacerbated by recent issues of poor nutrition. Patient and husband endorses patient has really not eaten much for at least 3 weeks  *Iron was less than 10 on anemia panel this admission.  *Hematology has ordered IV  dextran with pharmacy to dose . Infusion 05/26/12   Pulmonary embolism, bilateral- moderate clot burden  *This is recurrent and in the setting of therapeutic INR on Coumadin  *Status post IVC filter placement  *Currently her pulmonary status is stable and she is on room air  *She is also hemodynamically stable.  FU with Dr.Yamagata with IR after DC in 3 weeks  Tobacco abuse  *Counseled regarding cessation   HTN (hypertension)  *Blood pressure well controlled SBP range 132-143.  *Home ACE inhibitor with thiazide diuretic currently on hold   History of back surgery/leukocytosis  *Has been evaluated by neurosurgery this admission. Dr. [name] Dess has also reviewed the CT scan and discussed results with Dr. Sharon Seller. He mentioned abnormality regarding one of the screws in relationship to left common iliac vein is not an uncommon finding and Dr. [name] Dess does not feel that this has contributed to the patient's development of DVT  *Dr. Janee M[facility] at [facility] performed the initial surgery in August 2013   Hyponatremia/hypokalemia  *Resolved  *Suspect at this point primarily related to bowel prep so we'll follow electrolyte panel and when can take oral we'll replete with combination of IV and oral medication   Constipation, chronic  *Likely exacerbated by recent narcotic usage and suspect has contributed to patient's GI symptomatology in regards to nausea and vomiting  *Currently should be adequately treated after bowel prep but will need to begin preventative measures once diet resumed  *  started on docusate scheduled BID, will also schedule senna daily.   B12 deficiency  *Begin SQ load - will need to be followed in outpt setting   Code Status: Full code  Family Communication: patient at bedside  Disposition Plan: to inpatient rehab         Time spent on Discharge:  Signed: Arieon Scalzo [facility]  05/27/2012, 3:56 PM

## 2012-05-27 NOTE — Progress Notes (Signed)
Rehab admissions - Evaluated for possible admission.  I spoke with patient who would like to admit to inpatient rehab.  Rehab booklets and information given to patient.  I have called and faxed information to [facility] requesting inpatient rehab stay.  Awaiting response from insurance carrier.  I will follow up as soon as I hear from insurance case manager.  Call me for questions.  #[phone]

## 2012-05-27 NOTE — PMR Pre-admission (Signed)
PMR Admission Coordinator Pre-Admission Assessment  Patient: [name] [name] is an 56 y.o., female MRN: [id number] DOB: 18-Feb-1956 Height: 5\' 5"  (165.1 cm) Weight: 76 kg (167 lb 8.8 oz)  Insurance Information HMO:      PPO: Yes     PCP:       IPA:       80/20:       OTHER:  Group # [id number] PRIMARY: [employer] [place]      Policy#: [id number]      Subscriber: Donna Christen CM Name: Christie Beckers      Phone#:  [phone]     Fax#: [phone] Pre-Cert#: [id number]   Update by 06/02/12   Employer: [employer] Benefits:  Phone #: [phone]     Name: Marney Doctor. Date: 10/17/11     Deduct: $3500(met)      Out of Pocket Max: $3000(met)      Life Max: unlimited CIR: 70% w/auth      SNF: 70% w/auth  60 days max, none used Outpatient: 70%     Co-Pay: 30%  30 visits Home Health: 70% w/auth      Co-Pay: 30% DME: 70%     Co-Pay: 30% Providers: in network  Emergency Contact Information Contact Information    Name Relation Home Work Mobile   Debow,Christopher Spouse [phone]   [facility] Daughter [phone]  [phone]     Current Medical History  Patient Admitting Diagnosis: Recent back surgery with DVT/PE, multiple medical and deconditioning  History of Present Illness:A 56 y.o. right-handed female with history of spinal fusion surgery at [facility] 03/26/2012, subsequently had left leg DVT with bilateral pulmonary embolism the first week of September and was discharged on Lovenox and Coumadin on 04/23/2012. Patient to follow up with her PCP was noted INR of 2.4. She developed nausea vomiting as well as bright red blood per rectum as well as developing pain to the right lower extremity. In the workup showed hemoglobin 6.9 with guaiac stool positive, hypotensive and orthostatic with lowest blood pressure 91/59 and tachycardic as well as leukocytosis 21,400. She was admitted 05/18/2012 for ongoing evaluation. Venous Doppler studies right lower extremity  showed deep vein thrombosis right saphenofemoral junction, common femoral, femoral, popliteal and posterior tibial vein. Underwent placement of IVC filter, status post angioplasty, from the lysis and mechanical thrombectomy bilateral lower extremity stent placement 05/23/2012 per interventional radiology. Hematology consult at and request for hypercoagulation workup as concerned that patient developed DVT and pulmonary emboli in setting of therapeutic INR on Coumadin now with IVC filter. Dr. Dalene Carrow hematology services for lupus anticoagulant positive and presently advised to continue heparin drip and consider initiating Coumadin as an outpatient. Goal INR that time we 3.0-3.5. Followup in gastroenterology for anemia with EGD colonoscopy 05/19/2012 that was unremarkable except for some internal and external hemorrhoids. There was a rather deep anal fissure which was felt to be the source of her hematochezia but no evidence of blood in the colon.  Hgb 7.3 10/10 and received a unit of PRBCs 05/27/12.   Past Medical History  Past Medical History  Diagnosis Date  . DVT (deep venous thrombosis)   . Pulmonary embolism   . Back pain     Family History  family history includes Leukemia in her mother and Prostate cancer in her father.  Prior Rehab/Hospitalizations: No previous rehab.  Had back surgery at [facility] 08/19.  Found with PE and L DVT on 9/3 and re-hospitalized.  Current Medications  Current facility-administered medications:0.9 %  sodium chloride infusion, 250 mL, Intravenous, PRN, Reola Calkins, MD;  acetaminophen (TYLENOL) suppository 650 mg, 650 mg, Rectal, Q6H PRN, Ripudeep Jenna Luo, MD;  acetaminophen (TYLENOL) tablet 650 mg, 650 mg, Oral, Q6H PRN, Ripudeep K Rai, MD, 650 mg at 05/21/12 2031;  albuterol (PROVENTIL) (5 MG/ML) 0.5% nebulizer solution 2.5 mg, 2.5 mg, Nebulization, Q2H PRN, Robet Leu, PA alum & mag hydroxide-simeth (MAALOX/MYLANTA) 200-200-20 MG/5ML suspension 30 mL, 30 mL,  Oral, Q6H PRN, Ripudeep K Rai, MD;  antiseptic oral rinse (BIOTENE) solution 15 mL, 15 mL, Mouth Rinse, BID, Osvaldo Shipper, MD, 15 mL at 05/27/12 0738;  cyanocobalamin ((VITAMIN B-12)) injection 1,000 mcg, 1,000 mcg, Subcutaneous, QPC supper, Lonia Blood, MD, 1,000 mcg at 05/25/12 1754 docusate sodium (COLACE) capsule 100 mg, 100 mg, Oral, BID, Edsel Petrin, DO, 100 mg at 05/27/12 1027;  enoxaparin (LOVENOX) injection 75 mg, 75 mg, Subcutaneous, Q12H, Zannie Cove, MD, 75 mg at 05/27/12 1328;  furosemide (LASIX) tablet 20 mg, 20 mg, Oral, Daily, Lesle Chris Black, NP, 20 mg at 05/27/12 1027 HYDROcodone-acetaminophen (NORCO/VICODIN) 5-325 MG per tablet 1-2 tablet, 1-2 tablet, Oral, Q4H PRN, Ripudeep Jenna Luo, MD, 2 tablet at 05/26/12 2338;  HYDROmorphone (DILAUDID) injection 1 mg, 1 mg, Intravenous, Q4H PRN, Ripudeep K Rai, MD, 1 mg at 05/25/12 0126;  midazolam (VERSED) injection 1 mg, 1 mg, Intravenous, Q1H PRN, Reola Calkins, MD ondansetron [facility]) injection 4 mg, 4 mg, Intravenous, Q6H PRN, Ripudeep K Rai, MD, 4 mg at 05/19/12 0315;  ondansetron (ZOFRAN) tablet 4 mg, 4 mg, Oral, Q6H PRN, Ripudeep K Rai, MD;  pantoprazole (PROTONIX) injection 40 mg, 40 mg, Intravenous, Q12H, Ripudeep K Rai, MD, 40 mg at 05/27/12 1027;  senna (SENOKOT) tablet 17.2 mg, 2 tablet, Oral, Daily, Edsel Petrin, DO, 17.2 mg at 05/27/12 1027 sodium chloride 0.9 % injection 3 mL, 3 mL, Intravenous, Q12H, Reola Calkins, MD, 3 mL at 05/27/12 1027;  sodium chloride 0.9 % injection 3 mL, 3 mL, Intravenous, PRN, Reola Calkins, MD;  DISCONTD: 0.9 %  sodium chloride infusion, , Intravenous, Continuous, Russella Dar, NP, 10 mL/hr at 05/25/12 1430 DISCONTD: heparin ADULT infusion 100 units/mL (25000 units/250 mL), 1,900 Units/hr, Intravenous, Continuous, Edsel Petrin, DO, 1,900 Units/hr at 05/26/12 0325  Patients Current Diet: General  Precautions / Restrictions Precautions Precautions:  Fall;Back Precaution Booklet Issued: No Precaution Comments: pt verbalized back precautions.   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  Off on Thursdays and Sundays.  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    Current Functional Level Cognition  Arousal/Alertness: Awake/alert Overall Cognitive Status: Appears within functional limits for tasks assessed/performed Orientation Level: Oriented X4    Extremity Assessment (includes Sensation/Coordination)  RUE ROM/Strength/Tone: Unable to fully assess;Due to precautions (WFLS for AROM) RUE Sensation: WFL - Light Touch RUE Coordination: WFL - gross/fine motor  RLE ROM/Strength/Tone: WFL for tasks assessed RLE Sensation: WFL - Light Touch    ADLs  Eating/Feeding: Simulated;Independent Where Assessed - Eating/Feeding: Edge of bed Grooming: Simulated;Set up Where Assessed - Grooming: Unsupported sitting Upper Body Bathing: Simulated;Set up Where Assessed - Upper Body Bathing: Unsupported sitting Lower Body Bathing: Simulated;Moderate assistance Where Assessed - Lower Body  Bathing: Supported sit to Programmer, applications Dressing: Simulated;Set up Where Assessed - Upper Body Dressing: Unsupported sitting Lower Body Dressing: Simulated;Maximal assistance Where Assessed - Lower Body Dressing: Supported sit to stand Toilet Transfer: Performed;Minimal assistance Toilet Transfer Method: Other (comment) (ambulate with hand held assist to 3:1) Acupuncturist: Bedside commode Toileting - Clothing Manipulation and Hygiene: Simulated;Minimal assistance Where Assessed -  Engineer, mining and Hygiene: Sit to stand from 3-in-1 or toilet Tub/Shower Transfer Method: Not assessed Transfers/Ambulation Related to ADLs: Pt able to perform stand pivot transfers from bed with mod assist and from bedside commode with min assist. ADL Comments: Pt with decreased ability to reach her feet for simulated selfcare and toileting.  Will likely benfit from AE education.  Husband works during the day so pt needs to be modified independent for toileting and short distance mobility.    Mobility  Bed Mobility: Rolling Left;Left Sidelying to Sit Rolling Left: 5: Supervision;With rail Left Sidelying to Sit: 5: Supervision;With rails;HOB flat    Transfers  Transfers: Sit to Stand;Stand to Sit Sit to Stand: 4: Min assist;With upper extremity assist;From bed Stand to Sit: 4: Min guard;With upper extremity assist;To chair/3-in-1;With armrests    Ambulation / Gait / Stairs / Wheelchair Mobility  Ambulation/Gait Ambulation/Gait Assistance: 4: Min guard Ambulation Distance (Feet): 80 Feet Assistive device: Rolling walker Ambulation/Gait Assistance Details: cues for use of RW, back precautions with turns.   Gait Pattern: Step-through pattern;Decreased stride length Stairs: No Wheelchair Mobility Wheelchair Mobility: No    Posture / Balance Dynamic Standing Balance Dynamic Standing - Balance Support: Left upper extremity supported;Right upper extremity supported Dynamic Standing - Level of Assistance: 4: Min assist     Previous Home Environment Living Arrangements: Spouse/significant other Lives With: Spouse Available Help at Discharge: Available PRN/intermittently;Other (Comment) Type of Home: House Home Layout: One level Home Access: Level entry Bathroom Shower/Tub: Health visitor: Standard Bathroom Accessibility: Yes How Accessible: Accessible via walker Home Care Services: No  Discharge Living Setting Plans for Discharge Living Setting:  Patient's home;Lives with (comment);Mobile Home (Lives with husband.) Type of Home at Discharge: Mobile home (Added onto mobile home so now a double wide home.) Discharge Home Layout: One level;Multi-level Alternate Level Stairs-Number of Steps: den to kitchen 1 step up, bedroom to kitchen 1 step up and kitchen to living room 1 step up. Discharge Home Access: Level entry (Does have small step lip deck into den area of home.) Entrance Stairs-Number of Steps: one small step only from deck into den area of home Do you have any problems obtaining your medications?: No  Social/Family/Support Systems Patient Roles: Spouse;Parent Contact Information: Tynequa Harison - spouse (h) [phone] (c) [phone]; Sherron Flemings - Dtr (c) [phone] Anticipated Caregiver: Husband, Dtr, family Ability/Limitations of Caregiver: Husband works.  Dtr works for ex-husband, but close by and can come assist.  Ex-husband's aunt helps with errands and driving. Caregiver Availability: Intermittent Discharge Plan Discussed with Primary Caregiver: Yes Is Caregiver In Agreement with Plan?: Yes Does Caregiver/Family have Issues with Lodging/Transportation while Pt is in Rehab?: No  Goals/Additional Needs Patient/Family Goal for Rehab: PT mod I, OT MOd I to Min A, no ST needs Expected length of stay: 1-2 weeks Cultural Considerations: None Dietary Needs: Regular diet Equipment Needs: TBD Pt/Family Agrees to Admission and willing to participate: Yes Program Orientation Provided & Reviewed with Pt/Caregiver Including Roles  & Responsibilities: Yes  Patient Condition: This patient's condition remains as documented in the Consult dated 05/26/12, in which the  Rehabilitation Physician determined and documented that the patient's condition is appropriate for intensive rehabilitative care in an inpatient rehabilitation facility.  Preadmission Screen Completed By:  Trish Mage, 05/27/2012 2:08  PM ______________________________________________________________________   Discussed status with Dr. Wynn Banker on 05/27/12 at 1550 and received telephone approval for admission today.  Admission Coordinator:  Trish Mage, time1550/Date10/10/13

## 2012-05-27 NOTE — Plan of Care (Signed)
Overall Plan of Care [facility]) Patient Details Name: [name] [name] MRN: [id number] DOB: [birth year]  Diagnosis:PE, deconditioning, recent back surgery    Primary Diagnosis:    Severe muscle deconditioning Co-morbidities: gib  Functional Problem List  Patient demonstrates impairments in the following areas: Balance, Bladder, Bowel, Endurance, Pain and Safety  Basic ADL's: grooming, bathing, dressing, toileting and simple meal prep Advanced ADL's: simple meal preparation, laundry and light housekeeping  Transfers:  toilet and tub/shower Locomotion:  ambulation and stairs  Additional Impairments:  None  Anticipated Outcomes Item Anticipated Outcome  Eating/Swallowing    Basic self-care  Mod I except supervision with shower transfers  Tolieting  Mod I  Bowel/Bladder  Mod I  Transfers  Modified independent  Locomotion  Modified independent  Communication    Cognition    Pain  <3  Safety/Judgment    Other     Therapy Plan: PT Frequency: 2-3 X/day, 60-90 minutes OT Frequency: 1-2 X/day, 60-90 minutes     Team Interventions: Item RN PT OT SLP SW TR Other  Self Care/Advanced ADL Retraining   x      Neuromuscular Re-Education  x x      Therapeutic Activities  x x      UE/LE Strength Training/ROM  x x      UE/LE Coordination Activities         Visual/Perceptual Remediation/Compensation         DME/Adaptive Equipment Instruction  x x      Therapeutic Exercise  x x      Balance/Vestibular Training  x       Patient/Family Education  x x      Cognitive Remediation/Compensation         Functional Mobility Training  x x      Ambulation/Gait Training  x       Arts administrator         Bladder Management x        Bowel Management x        Disease Management/Prevention x         Pain Management x x       Medication Management x        Skin Care/Wound Management x        Splinting/Orthotics x        Discharge Planning x x       Psychosocial Support x x                          Team Discharge Planning: Destination:  Home Projected Follow-up:  PT, OT and Home Health Projected Equipment Needs:  Information systems manager Patient/family involved in discharge planning:  Yes  MD ELOS: 5 days Medical Rehab Prognosis:  Excellent Assessment: Pt admitted for cir therapies. The team will be addressing pain, self-care, stamina, strength, balance, fxnl mobility. Goals are mod I

## 2012-05-27 NOTE — Progress Notes (Addendum)
Pharmacy Consult-Anticoagulation  Pharmacy Consult:  56 y/o female with Hypercoagulation syndrome/thrombocytosis who redeveloped DVT and PE in spite of therapeutic INR on Coumadin. Patient placed on Heparin per protocol. Etiology felt to be related to antiphospholipid antibody syndrome. Lupus anticoagulant positive but may be transient as per Hematology consult   Current Labs: Hematology  [facility] 05/27/12 0450 05/26/12 1655 05/26/12 0525 05/25/12 0515  HGB 7.3* 7.6* -- --  HCT 21.8* 22.9* 22.5* --  PLT 517* 457* 459* --  HEPARINUNFRC 0.41 -- 0.42 0.41   Lab Results  Component Value Date   INR 2.19* 05/21/2012   INR 2.13* 05/21/2012   INR 2.29* 05/21/2012    Estimated Creatinine Clearance: 79.1 ml/min (by C-G formula based on Cr of 0.49).  Current Meds: antiseptic oral rinse 15 mL Mouth Rinse BID  cyanocobalamin 1,000 mcg Subcutaneous QPC supper  docusate sodium 100 mg Oral BID  furosemide 20 mg Oral Daily  iron dextran (INFED/DEXFERRUM) infusion 400 mg Intravenous Once  pantoprazole (PROTONIX) IV 40 mg Intravenous Q12H  senna 2 tablet Oral Daily  sodium chloride 3 mL Intravenous Q12H    Assessment:  56 y/o female with Hypercoagulation syndrome/thrombocytosis who redeveloped DVT and PE in spite of therapeutic INR on Coumadin.  Heparin infusing at 1900 units/hr.  H/H continuing to drift downward with HGB 7.3 this AM.  Heparin level is stable 0.41 units/ml.  Goal/Plan:  Heparin goal is 0.3 - 0.7  Patient will receive 1 unit PRBC's today.  Heparin will be continued at 1900 units/hr.  Next Heparin level due tomorrow with AM labs.  Daily Heparin level and CBC while on Heparin.  Naly Schwanz, Elisha Headland, Pharm.D. 05/27/2012  9:04 AM  Addendum:  11:35 AM   05/27/2012  Pharmacy to switch from UFH infusion to Lovenox in preparation for home therapy upon discharge. Last weight 76 kg,  CrCl 79 ml/min. WBC/Hgb/Hct/Plts:  7.2/7.3/21.8/517 [prior to blood transfusion  today].  Plan:    Discontinue Heparin infusion.  Begin Lovenox 75 mg sq q 12 hours, first dose 12:30 pm, then q 10 pm, 10 am.  CBC's q 3 days.    Jecenia Leamer, Elisha Headland, Pharm.D.  05/27/2012 11:42 AM

## 2012-05-27 NOTE — Progress Notes (Signed)
OT Cancellation Note  Patient Details Name: [name] [name] MRN: [id number] DOB: 11-01-1955   Cancelled Treatment:    Reason Eval/Treat Not Completed: Medical issues which prohibited therapy (Hbg 7.3 - pt receiving blood)  [name] M 05/27/2012, 3:23 PM

## 2012-05-27 NOTE — Progress Notes (Signed)
[facility] PROGRESS NOTE  [name] [name] WGN:[id number] DOB: February 14, 1956 DOA: 05/18/2012 PCP: Rudi Heap, MD  Assessment/Plan: Hypercoagulation syndrome/thrombocytosis  *Very concerning since redeveloped DVT and PE in setting of therapeutic INR on Coumadin  * Etiology felt to be related to antiphospholipid antibody syndrome-lupus anticoagulant positive but hematology feels this may be transient.  *Because of recurrent PE an IVC filter was recommended and during insertion of this filter it was noted that this patient had extensive near occlusive thrombus extending to the IVC with nonvisualization of the left renal vein.  *Appreciate hematology assistance. Will Transition to Full dose lovenox per heme/Onc recs since very close to DC, then FU with Hematology at Outpatient  *CT of the abdomen and pelvis shows no gross signs of malignancy.   DVT, recurrent, lower extremity, acute- history of LLE DVT 04/2012/associated significant lower extremity edema:  A) status post mechanical thrombectomy  B) angioplasty and stent placement and  C) transcatheter thrombolysis infusion  D) status post IVC filter placement  *Appreciate interventional radiology assistance  *After procedure on 05/26/2012 interventional radiology notes chronic clot remains-please see the procedure note  *Hematology is recommending to continue heparin until time of discharge then to transition to Lovenox and continue Lovenox after discharge consideration for initiation of Coumadin after outpatient followup in the hematology clinic  *Continue thigh-high compression stockings with a 20-30 compression strength  *Patient is to followup in the interventional radiology clinic 3 weeks after discharge-discussion regarding removal of IVC filter will be performed at clinic visit  Bilateral LEE some improvement. LE still edematous but softer.  Volume status +8L.May be contributing to edema. Continue low dose lasix and monitor, responding  well to diuresis  Fever  *resolved, possibly from DVT  *Patient initially with leukocytosis but no fever which is likely reactive and related to extensive thrombosis. As a precaution the patient was started on broad-spectrum antibiotics. No definitive infection or abscess seen on CT abdomen and pelvis.  * Antibiotics were discontinued 10/6, (no fevers or reason to continue)   Acute blood loss anemia  *Review of the laboratory data reveals that when patient presented initially in September hemoglobin was around 12 and at time of discharge hemoglobin had decreased to 7.4- suspect it is mostly related to consumption  *Patient has  received 2 units of packed red blood cells this admission. On 10/10 7.3 with slight trend downward. Will transfuse 1 unit PRBC's for total of 3 units since admission.  *Continue to follow CBC and transfuse as needed.   Lower GI bleed likely sec to anal fissur  *Appreciate gastroenterology assistance  *EGD and colonoscopy completed 05/19/2012 and only revealed nonbleeding anal fissure  *Patient also endorsed a history of GERD to the gastroenterologist  Tolerating diet well.   Iron deficiency  *Likely related to chronic ongoing blood loss exacerbated by recent issues of poor nutrition. Patient and husband endorses patient has really not eaten much for at least 3 weeks  *Iron was less than 10 on anemia panel this admission.  *Hematology has ordered IV dextran with pharmacy to dose . Infusion 05/26/12   Pulmonary embolism, bilateral- moderate clot burden  *This is recurrent and in the setting of therapeutic INR on Coumadin  *Status post IVC filter placement  *Currently her pulmonary status is stable and she is on room air  *She is also hemodynamically stable.  FU with Dr.Yamagata with IR after DC  Tobacco abuse  *Counseled regarding cessation - an absolute must   HTN (hypertension)  *Blood pressure well  controlled SBP range 132-143.  *Home ACE inhibitor with  thiazide diuretic currently on hold   History of back surgery/leukocytosis  *Has been evaluated by neurosurgery this admission. Dr. [name] Dess has also reviewed the CT scan and discussed results with Dr. Sharon Seller. He mentioned abnormality regarding one of the screws in relationship to left common iliac vein is not an uncommon finding and Dr. [name] Dess does not feel that this has contributed to the patient's development of DVT  *Dr. Janee Morn at [facility] performed the initial surgery in August 2013   Hyponatremia/hypokalemia  *Resolved  *Suspect at this point primarily related to bowel prep so we'll follow electrolyte panel and when can take oral we'll replete with combination of IV and oral medication   Constipation, chronic  *Likely exacerbated by recent narcotic usage and suspect has contributed to patient's GI symptomatology in regards to nausea and vomiting  *Currently should be adequately treated after bowel prep but will need to begin preventative measures once diet resumed  *started on docusate scheduled BID, will also schedule senna daily.   B12 deficiency  *Begin SQ load - will need to be followed in outpt setting   Code Status: Full code Family Communication: patient at bedside Disposition Plan: to inpatient rehab likely tomorrow.    Consultants: Hematology  Pulmonology  Gastroenterology  Interventional radiology  Inpatient rehabilitation team  Procedures: Placement of supra-renal IVC filter on 05/18/2012 interventional radiology  EGD and colonoscopy by Dr. Elnoria Howard pending on 05/19/2012  Bilateral lower extremity catheter placement with subsequent TNK infusion  Angioplasty with mechanical thrombectomy and stenting of lower IVC and bilateral iliac veins on 05/24/2012 per interventional radiology  Antibiotics: Zosyn stopped 10/6   HPI/Subjective:  Sitting up in bed watching TV. Reports "feeling better today. Urinated a lot yesterday,  My legs have gone  down some". Denies discomfort but reports no appetite.     Objective: Filed Vitals:   05/25/12 1952 05/26/12 0418 05/26/12 2056 05/27/12 0509  BP: 131/54 102/64 143/61 143/78  Pulse: 102 101 107 103  Temp: 98 F (36.7 C) 98.2 F (36.8 C) 98.6 F (37 C) 98.4 F (36.9 C)  TempSrc: Oral Oral Oral Oral  Resp: 20 19 18 19   Height:      Weight:      SpO2: 100% 95% 96% 96%    Intake/Output Summary (Last 24 hours) at 05/27/12 0948 Last data filed at 05/26/12 2000  Gross per 24 hour  Intake    240 ml  Output   2500 ml  Net  -2260 ml   Filed Weights   05/18/12 1040 05/18/12 1530 05/19/12 0730  Weight: 65.772 kg (145 lb) 72.1 kg (158 lb 15.2 oz) 76 kg (167 lb 8.8 oz)    Exam:   General:  Awake, alert NAD  Cardiovascular: RRR 2+LEE. Compression stocking in tact bilaterally PPP  Respiratory: Normal effort. BSCTAB No wheeze, rhonchi  Abdomen: obese, soft, +BS non-tender to palpation  Ext. 2 plus edema. Bilateral thigh high compression stocking.   Data Reviewed: Basic Metabolic Panel:  Lab 05/23/12 1610 05/21/12 0445  NA 135 132*  K 3.1* 3.7  CL 102 100  CO2 19 21  GLUCOSE 78 98  BUN 3* 5*  CREATININE 0.49* 0.47*  CALCIUM 8.3* 8.3*  MG -- --  PHOS -- --   Liver Function Tests:  Lab 05/21/12 0445  AST 27  ALT 18  ALKPHOS 274*  BILITOT 0.4  PROT 5.6*  ALBUMIN 1.4*   No results  found for this basename: LIPASE:5,AMYLASE:5 in the last 168 hours No results found for this basename: AMMONIA:5 in the last 168 hours CBC:  Lab 05/27/12 0450 05/26/12 1655 05/26/12 0525 05/25/12 0515 05/23/12 0500  WBC 7.2 7.0 7.1 9.4 10.9*  NEUTROABS -- -- -- -- --  HGB 7.3* 7.6* 7.4* 7.7* 8.3*  HCT 21.8* 22.9* 22.5* 23.5* 25.3*  MCV 82.3 82.4 81.2 82.7 84.3  PLT 517* 457* 459* 412* 350   Cardiac Enzymes: No results found for this basename: CKTOTAL:5,CKMB:5,CKMBINDEX:5,TROPONINI:5 in the last 168 hours BNP (last 3 results)  Basename 04/21/12 0106  PROBNP 292.5*    CBG: No results found for this basename: GLUCAP:5 in the last 168 hours  Recent Results (from the past 240 hour(s))  MRSA PCR SCREENING     Status: Normal   Collection Time   05/18/12  3:48 PM      Component Value Range Status Comment   MRSA by PCR NEGATIVE  NEGATIVE Final   URINE CULTURE     Status: Normal   Collection Time   05/18/12 11:24 PM      Component Value Range Status Comment   Specimen Description URINE, CLEAN CATCH   Final    Special Requests NONE   Final    Culture  Setup Time 05/19/2012 01:00   Final    Colony Count NO GROWTH   Final    Culture NO GROWTH   Final    Report Status 05/19/2012 FINAL   Final   URINE CULTURE     Status: Normal   Collection Time   05/22/12  2:54 AM      Component Value Range Status Comment   Specimen Description URINE, RANDOM   Final    Special Requests NONE   Final    Culture  Setup Time 05/22/2012 12:51   Final    Colony Count 50,000 COLONIES/ML   Final    Culture     Final    Value: Multiple bacterial morphotypes present, none predominant. Suggest appropriate recollection if clinically indicated.   Report Status 05/23/2012 FINAL   Final      Studies: No results found.  Scheduled Meds:   . antiseptic oral rinse  15 mL Mouth Rinse BID  . cyanocobalamin  1,000 mcg Subcutaneous QPC supper  . docusate sodium  100 mg Oral BID  . furosemide  20 mg Oral Daily  . pantoprazole (PROTONIX) IV  40 mg Intravenous Q12H  . senna  2 tablet Oral Daily  . sodium chloride  3 mL Intravenous Q12H   Continuous Infusions:   . sodium chloride 10 mL/hr (05/25/12 1430)  . heparin 1,900 Units/hr (05/26/12 0325)      Time spent: 30 minutes    [facility] M  [facility]  If 8PM-8AM, please contact night-coverage at [email or web address], password [facility] 05/27/2012, 9:48 AM  LOS: 9 days

## 2012-05-28 ENCOUNTER — Encounter (HOSPITAL_COMMUNITY): Payer: Self-pay | Admitting: *Deleted

## 2012-05-28 ENCOUNTER — Inpatient Hospital Stay (HOSPITAL_COMMUNITY): Payer: BC Managed Care – PPO | Admitting: Physical Therapy

## 2012-05-28 ENCOUNTER — Inpatient Hospital Stay (HOSPITAL_COMMUNITY): Payer: BC Managed Care – PPO | Admitting: Occupational Therapy

## 2012-05-28 ENCOUNTER — Inpatient Hospital Stay (HOSPITAL_COMMUNITY): Payer: BC Managed Care – PPO | Admitting: *Deleted

## 2012-05-28 DIAGNOSIS — Z5189 Encounter for other specified aftercare: Secondary | ICD-10-CM

## 2012-05-28 DIAGNOSIS — Q762 Congenital spondylolisthesis: Secondary | ICD-10-CM

## 2012-05-28 DIAGNOSIS — I2699 Other pulmonary embolism without acute cor pulmonale: Secondary | ICD-10-CM

## 2012-05-28 DIAGNOSIS — R5381 Other malaise: Secondary | ICD-10-CM

## 2012-05-28 DIAGNOSIS — M47817 Spondylosis without myelopathy or radiculopathy, lumbosacral region: Secondary | ICD-10-CM

## 2012-05-28 DIAGNOSIS — R29898 Other symptoms and signs involving the musculoskeletal system: Secondary | ICD-10-CM

## 2012-05-28 LAB — CBC WITH DIFFERENTIAL/PLATELET
Basophils Absolute: 0 10*3/uL (ref 0.0–0.1)
Basophils Relative: 0 % (ref 0–1)
Eosinophils Absolute: 0.1 10*3/uL (ref 0.0–0.7)
Eosinophils Relative: 1 % (ref 0–5)
HCT: 28.3 % — ABNORMAL LOW (ref 36.0–46.0)
Hemoglobin: 9.4 g/dL — ABNORMAL LOW (ref 12.0–15.0)
Lymphocytes Relative: 22 % (ref 12–46)
Lymphs Abs: 1.9 10*3/uL (ref 0.7–4.0)
MCH: 27.2 pg (ref 26.0–34.0)
MCHC: 33.2 g/dL (ref 30.0–36.0)
MCV: 81.8 fL (ref 78.0–100.0)
Monocytes Absolute: 0.8 10*3/uL (ref 0.1–1.0)
Monocytes Relative: 10 % (ref 3–12)
Neutro Abs: 5.5 10*3/uL (ref 1.7–7.7)
Neutrophils Relative %: 66 % (ref 43–77)
Platelets: 553 10*3/uL — ABNORMAL HIGH (ref 150–400)
RBC: 3.46 MIL/uL — ABNORMAL LOW (ref 3.87–5.11)
RDW: 16.4 % — ABNORMAL HIGH (ref 11.5–15.5)
WBC: 8.3 10*3/uL (ref 4.0–10.5)

## 2012-05-28 LAB — BASIC METABOLIC PANEL
BUN: <3 mg/dL — ABNORMAL LOW (ref 6–23)
BUN: <3 mg/dL — ABNORMAL LOW (ref 6–23)
BUN: <3 mg/dL — ABNORMAL LOW (ref 6–23)
CO2: 31 mEq/L (ref 19–32)
CO2: 36 mEq/L — ABNORMAL HIGH (ref 19–32)
CO2: 34 mEq/L — ABNORMAL HIGH (ref 19–32)
Calcium: 8.6 mg/dL (ref 8.4–10.5)
Calcium: 8.6 mg/dL (ref 8.4–10.5)
Calcium: 8.7 mg/dL (ref 8.4–10.5)
Chloride: 90 mEq/L — ABNORMAL LOW (ref 96–112)
Chloride: 92 mEq/L — ABNORMAL LOW (ref 96–112)
Chloride: 93 mEq/L — ABNORMAL LOW (ref 96–112)
Creatinine, Ser: 0.36 mg/dL — ABNORMAL LOW (ref 0.50–1.10)
Creatinine, Ser: 0.4 mg/dL — ABNORMAL LOW (ref 0.50–1.10)
Creatinine, Ser: 0.34 mg/dL — ABNORMAL LOW (ref 0.50–1.10)
GFR calc Af Amer: >90 mL/min (ref >90)
GFR calc Af Amer: >90 mL/min (ref >90)
GFR calc Af Amer: >90 mL/min (ref >90)
GFR calc non Af Amer: >90 mL/min (ref >90)
GFR calc non Af Amer: >90 mL/min (ref >90)
GFR calc non Af Amer: >90 mL/min (ref >90)
Glucose, Bld: 112 mg/dL — ABNORMAL HIGH (ref 70–99)
Glucose, Bld: 127 mg/dL — ABNORMAL HIGH (ref 70–99)
Glucose, Bld: 118 mg/dL — ABNORMAL HIGH (ref 70–99)
Potassium: <2 mEq/L — CL (ref 3.5–5.1)
Potassium: 2.6 mEq/L — CL (ref 3.5–5.1)
Potassium: 2.7 mEq/L — CL (ref 3.5–5.1)
Sodium: 137 mEq/L (ref 135–145)
Sodium: 140 mEq/L (ref 135–145)
Sodium: 139 mEq/L (ref 135–145)

## 2012-05-28 LAB — TYPE AND SCREEN
ABO/RH(D): A POS
Antibody Screen: NEGATIVE
Unit division: 0

## 2012-05-28 MED ORDER — POTASSIUM CHLORIDE CRYS ER 20 MEQ PO TBCR: 40.0000 meq | EXTENDED_RELEASE_TABLET | Freq: Once | ORAL | Status: DC

## 2012-05-28 MED ORDER — SODIUM CHLORIDE 0.45 % IV SOLN
Freq: Once | INTRAVENOUS | Status: AC
  Administered 2012-05-28: 14:00:00 via INTRAVENOUS

## 2012-05-28 MED ORDER — POTASSIUM CHLORIDE 10 MEQ/100ML IV SOLN
10.0000 meq | INTRAVENOUS | Status: AC
  Administered 2012-05-28 (×3): 10 meq via INTRAVENOUS
  Filled 2012-05-28 (×3): qty 100

## 2012-05-28 MED ORDER — POTASSIUM CHLORIDE CRYS ER 20 MEQ PO TBCR
40.0000 meq | EXTENDED_RELEASE_TABLET | Freq: Once | ORAL | Status: AC
  Administered 2012-05-28: 40 meq via ORAL
  Filled 2012-05-28: qty 2

## 2012-05-28 MED ORDER — PANTOPRAZOLE SODIUM 40 MG PO TBEC
40.0000 mg | DELAYED_RELEASE_TABLET | Freq: Two times a day (BID) | ORAL | Status: DC
  Administered 2012-05-29 – 2012-05-31 (×4): 40 mg via ORAL
  Filled 2012-05-28 (×4): qty 1

## 2012-05-28 MED ORDER — POTASSIUM CHLORIDE CRYS ER 20 MEQ PO TBCR
40.0000 meq | EXTENDED_RELEASE_TABLET | Freq: Two times a day (BID) | ORAL | Status: AC
  Administered 2012-05-28 (×2): 40 meq via ORAL
  Filled 2012-05-28 (×2): qty 2

## 2012-05-28 MED ORDER — POTASSIUM CHLORIDE 20 MEQ/15ML (10%) PO LIQD
40.0000 meq | Freq: Once | ORAL | Status: AC
  Administered 2012-05-28: 40 meq via ORAL
  Filled 2012-05-28: qty 30

## 2012-05-28 MED ORDER — POTASSIUM CHLORIDE 20 MEQ PO PACK
40.0000 meq | PACK | Freq: Once | ORAL | Status: DC
  Filled 2012-05-28: qty 2

## 2012-05-28 NOTE — H&P (Signed)
Physical Medicine and Rehabilitation Admission H&P    No chief complaint on file. : HPI: [name] [name] is a 56 y.o. right-handed female with history of spinal fusion surgery at [facility] 03/26/2012, subsequently had left leg DVT with bilateral pulmonary embolism the first week of September and was discharged on Lovenox and Coumadin on 04/23/2012. Patient  followed up with her PCP with noted INR of 2.4. She developed nausea vomiting as well as bright red blood per rectum as well as developing pain to the right lower extremity. In the workup showed hemoglobin 6.9 with guaiac stool positive, hypotensive and orthostatic with lowest blood pressure 91/59 and tachycardic as well as leukocytosis 21,400. She was admitted 05/18/2012 for ongoing evaluation. Venous Doppler studies right lower extremity showed deep vein thrombosis right saphenofemoral junction, common femoral, femoral, popliteal and posterior tibial vein. Underwent placement of IVC filter, status post angioplasty, from the lysis and mechanical thrombectomy bilateral lower extremity stent placement 05/23/2012 per interventional radiology. Hematology consult at and request for hypercoagulation workup as concerned that patient developed DVT and pulmonary emboli in setting of therapeutic INR on Coumadin now with IVC filter. Dr. Dalene Carrow hematology services for lupus anticoagulant positive and presently advised to continue subcutaneous Lovenox and consider initiating Coumadin as an outpatient. Goal INR at that time will be 3.0-3.5. Followup in gastroenterology for anemia with EGD colonoscopy 05/19/2012 that was unremarkable except for some internal and external hemorrhoids. There was a rather deep anal fissure which was felt to be the source of her hematochezia but no evidence of blood in the colon. Physical and Occupational therapy evaluations completed 05/25/2012 and ongoing with noted routine back precautions after recent back surgery . Physical medicine  and rehabilitation was consulted at the request of PT and OT to consider inpatient rehabilitation services. Patient was felt to be a good candidate for inpatient rehabilitation services and was admitted for comprehensive rehabilitation program  Pt was independent prior to admission Review of Systems  Respiratory: Positive for shortness of breath.  Cardiovascular: Positive for leg swelling.  Gastrointestinal: Positive for nausea and vomiting.  Musculoskeletal: Positive for back pain.  All other systems reviewed and are negative    Past Medical History  Diagnosis Date  . DVT (deep venous thrombosis)   . Pulmonary embolism   . Back pain    Past Surgical History  Procedure Date  . Back surgery   . Colonoscopy 05/19/2012    Procedure: COLONOSCOPY;  Surgeon: Theda Belfast, MD;  Location: [facility] ENDOSCOPY;  Service: Endoscopy;  Laterality: N/A;  . Esophagogastroduodenoscopy 05/19/2012    Procedure: ESOPHAGOGASTRODUODENOSCOPY (EGD);  Surgeon: Theda Belfast, MD;  Location: [facility] ENDOSCOPY;  Service: Endoscopy;  Laterality: N/A;   Family History  Problem Relation Age of Onset  . Leukemia Mother   . Prostate cancer Father    Social History:  reports that she has been smoking.  She does not have any smokeless tobacco history on file. She reports that she drinks alcohol. She reports that she does not use illicit drugs. Allergies:  Allergies  Allergen Reactions  . Prednisone Other (See Comments)    No energy "stalls out"  . Tramadol Other (See Comments)    constipation   Medications Prior to Admission  Medication Sig Dispense Refill  . sennosides-docusate sodium (SENOKOT-S) 8.6-50 MG tablet Take 2 tablets by mouth every evening.        Home:     Functional History:    Functional Status:  Mobility:  ADL:    Cognition: Cognition Orientation Level: Oriented X4     Blood pressure 145/82, pulse 99, temperature 98.3 F (36.8 C), temperature source Oral, resp. rate 20,  weight 79.9 kg (176 lb 2.4 oz), SpO2 96.00%. Physical Exam  Vitals reviewed.  Constitutional: She is oriented to person, place, and time. She appears well-developed.  HENT:  Head: Normocephalic.  Eyes:  Pupils round reactive to light  Neck: Neck supple. No thyromegaly present.  Cardiovascular: Normal rate and regular rhythm.  Pulmonary/Chest: Effort normal and breath sounds normal. No respiratory distress. She has no wheezes.  Abdominal: Soft. Bowel sounds are normal. She exhibits no distension. There is no tenderness.  Musculoskeletal: She exhibits edema.  +1 edema lower extremities tenderness to palpation R inguinal area o, no calf tenderness Neurological: She is alert and oriented to person, place, and time. No cranial nerve deficit or sensory deficit.  Pt with weakness in both legs, grossly 2/5 in HF,KE, 3/5 in ankle DF/PF 5/5 in Bilat delt, bi tri grip[  Skin:  Back incision is healed  Psychiatric: She has a normal mood and affect. Her behavior is normal. Judgment and thought content normal.    Results for orders placed during the hospital encounter of 05/18/12 (from the past 48 hour(s))  CBC     Status: Abnormal   Collection Time   05/26/12  4:55 PM      Component Value Range Comment   WBC 7.0  4.0 - 10.5 K/uL    RBC 2.78 (*) 3.87 - 5.11 MIL/uL    Hemoglobin 7.6 (*) 12.0 - 15.0 g/dL    HCT 16.1 (*) 09.6 - 46.0 %    MCV 82.4  78.0 - 100.0 fL    MCH 27.3  26.0 - 34.0 pg    MCHC 33.2  30.0 - 36.0 g/dL    RDW 04.5 (*) 40.9 - 15.5 %    Platelets 457 (*) 150 - 400 K/uL   HEPARIN LEVEL (UNFRACTIONATED)     Status: Normal   Collection Time   05/27/12  4:50 AM      Component Value Range Comment   Heparin Unfractionated 0.41  0.30 - 0.70 IU/mL   CBC     Status: Abnormal   Collection Time   05/27/12  4:50 AM      Component Value Range Comment   WBC 7.2  4.0 - 10.5 K/uL    RBC 2.65 (*) 3.87 - 5.11 MIL/uL    Hemoglobin 7.3 (*) 12.0 - 15.0 g/dL    HCT 81.1 (*) 91.4 - 46.0 %     MCV 82.3  78.0 - 100.0 fL    MCH 27.5  26.0 - 34.0 pg    MCHC 33.5  30.0 - 36.0 g/dL    RDW 78.2 (*) 95.6 - 15.5 %    Platelets 517 (*) 150 - 400 K/uL   PREPARE RBC (CROSSMATCH)     Status: Normal   Collection Time   05/27/12  9:36 AM      Component Value Range Comment   Order Confirmation ORDER PROCESSED BY BLOOD BANK     TYPE AND SCREEN     Status: Normal (Preliminary result)   Collection Time   05/27/12  9:36 AM      Component Value Range Comment   ABO/RH(D) A POS      Antibody Screen NEG      Sample Expiration 05/30/2012      Unit Number [id number]      Blood  Component Type RED CELLS,LR      Unit division 00      Status of Unit ISSUED      Transfusion Status OK TO TRANSFUSE      Crossmatch Result Compatible      No results found.  Post Admission Physician Evaluation: 1. Functional deficits secondary  to Deconditioning due to extensive BLE DVT and reulting edema. 2. Patient is admitted to receive collaborative, interdisciplinary care between the physiatrist, rehab nursing staff, and therapy team. 3. Patient's level of medical complexity and substantial therapy needs in context of that medical necessity cannot be provided at a lesser intensity of care such as a SNF. 4. Patient has experienced substantial functional loss from his/her baseline which was documented above under the "Functional History" and "Functional Status" headings.  Judging by the patient's diagnosis, physical exam, and functional history, the patient has potential for functional progress which will result in measurable gains while on inpatient rehab.  These gains will be of substantial and practical use upon discharge  in facilitating mobility and self-care at the household level. 5. Physiatrist will provide 24 hour management of medical needs as well as oversight of the therapy plan/treatment and provide guidance as appropriate regarding the interaction of the two. 6. 24 hour rehab nursing will assist with  bladder management, bowel management, safety, skin/wound care, disease management, medication administration, pain management and patient education  and help integrate therapy concepts, techniques,education, etc. 7. PT will assess and treat for:  Pre gait, gait, endurance safety equipment.  Goals are: Mod I mobility. 8. OT will assess and treat for: ADL, safety , endurance, equpment.   Goals are: Mod I ADL. 9. SLP will assess and treat for: NA.  Goals are: NA. 10. Case Management and Social Worker will assess and treat for psychological issues and discharge planning. 11. Team conference will be held weekly to assess progress toward goals and to determine barriers to discharge. 12. Patient will receive at least 3 hours of therapy per day at least 5 days per week. 13. ELOS: 7-10 days      Prognosis:  excellent   Medical Problem List and Plan: 1. Recent back surgery status post spinal fusion at [facility] 03/26/2012 complicated by DVT/pulmonary emboli/deconditioning 2. DVT Prophylaxis/Anticoagulation: Subcutaneous Lovenox and plan to transition to Coumadin as outpatient. Monitor for a bleeding episodes. IVC filter also in place 05/23/2012 3. Pain Management: Vicodin as needed. Monitor with increased mobility 4. Neuropsych: This patient is  capable of making decisions on his/her own behalf. 5. Anemia/hematochezia. Followup CBC. Gastroenterology workup with findings of deep anal fissure and EGD colonoscopy unremarkable. Latest hemoglobin 7.3 05/27/2012 and transfused today 6. Positive lupus anticoagulant. Followup per hematology services Pt seen and examined 05/27/2012, documentation completed 05/28/2012 05/28/2012, 6:14 AM

## 2012-05-28 NOTE — Progress Notes (Signed)
Lab reported a panic potassium level of 2.6 after recheck at 1705.  Harvel Ricks, PA aware of the result, and Pam Love, PA  order for follow up BMP at 1800.  Will continue to monitor the patient.

## 2012-05-28 NOTE — Progress Notes (Signed)
Occupational Therapy Note  Patient Details  Name: [name] [name] MRN: [id number] Date of Birth: 1956/04/29 Today's Date: 05/28/2012  Time:  1345-1430   Pt. Missed 45 mins. Due to bedrest order.     Humberto Seals 05/28/2012, 3:52 PM

## 2012-05-28 NOTE — Progress Notes (Signed)
Occupational Therapy Note  Patient Details  Name: Charrisse Ch MRN: [id number] Date of Birth: [birth year] Today's Date: 05/28/2012  Pt scheduled for 30 min of OT, but nursing stated that MD is holding therapies as of now due to her low potassium levels.   SAGUIER,JULIA 05/28/2012, 11:13 AM

## 2012-05-28 NOTE — Progress Notes (Addendum)
Subjective/Complaints: Late night. Swelling bothers her. Pain under fair control  Objective: Vital Signs: Blood pressure 145/82, pulse 99, temperature 98.3 F (36.8 C), temperature source Oral, resp. rate 20, weight 79.9 kg (176 lb 2.4 oz), SpO2 96.00%. No results found.  Basename 05/28/12 0625 05/27/12 0450  WBC 8.3 7.2  HGB 9.4* 7.3*  HCT 28.3* 21.8*  PLT 553* 517*   No results found for this basename: NA:2,K:2,CL:2,CO2:2,GLUCOSE:2,BUN:2,CREATININE:2,CALCIUM:2 in the last 72 hours CBG (last 3)  No results found for this basename: GLUCAP:3 in the last 72 hours  Wt Readings from Last 3 Encounters:  05/27/12 79.9 kg (176 lb 2.4 oz)  05/19/12 76 kg (167 lb 8.8 oz)  05/19/12 76 kg (167 lb 8.8 oz)    Physical Exam:  Constitutional: She is oriented to person, place, and time. She appears well-developed.  HENT:  Head: Normocephalic.  Eyes:  Pupils round reactive to light  Neck: Neck supple. No thyromegaly present.  Cardiovascular: Normal rate and regular rhythm.  Pulmonary/Chest: Effort normal and breath sounds normal. No respiratory distress. She has no wheezes.  Abdominal: Soft. Bowel sounds are normal. She exhibits no distension. There is no tenderness.  Musculoskeletal: She exhibits edema.  +2 edema lower extremities tenderness to palpation R inguinal area o, no calf tenderness Neurological: She is alert and oriented to person, place, and time. No cranial nerve deficit or sensory deficit.  Pt with weakness in both legs, grossly 2/5 in HF,KE, 3/5 in ankle DF/PF  5/5 in Bilat delt, bi tri grip  Skin:  Back incision is healed  Psychiatric: She has a normal mood and affect. Her behavior is normal. Judgment and thought content normal.      Assessment/Plan: 1. Functional deficits secondary to deconditioning/DVT after lumbar spine surgery which require 3+ hours per day of interdisciplinary therapy in a comprehensive inpatient rehab setting. Physiatrist is providing close team  supervision and 24 hour management of active medical problems listed below. Physiatrist and rehab team continue to assess barriers to discharge/monitor patient progress toward functional and medical goals. FIM:       FIM - Toileting Toileting steps completed by patient: Adjust clothing prior to toileting;Performs perineal hygiene;Adjust clothing after toileting Toileting Assistive Devices: Grab bar or rail for support Toileting: 4: Steadying assist  FIM - Diplomatic Services operational officer Devices: Grab bars Toilet Transfers: 6-More than reasonable amt of time        Comprehension Comprehension Mode: Auditory Comprehension: 7-Follows complex conversation/direction: With no assist  Expression Expression Mode: Verbal Expression: 7-Expresses complex ideas: With no assist  Social Interaction Social Interaction: 7-Interacts appropriately with others - No medications needed.  Problem Solving Problem Solving: 7-Solves complex problems: Recognizes & self-corrects  Memory Memory: 7-Complete Independence: No helper  Medical Problem List and Plan:  1. Recent back surgery status post spinal fusion at [facility] 03/26/2012 complicated by DVT/pulmonary emboli/deconditioning  2. DVT Prophylaxis/Anticoagulation: Subcutaneous Lovenox and plan to transition to Coumadin as outpatient. Monitor for a bleeding episodes. IVC filter also in place 05/23/2012   -TEDS, elevation of legs to help control edema 3. Pain Management: Vicodin as needed. Monitor with increased mobility  4. Neuropsych: This patient is capable of making decisions on his/her own behalf.  5. Anemia/hematochezia. Followup CBC. Gastroenterology workup with findings of deep anal fissure and EGD colonoscopy unremarkable. Latest hemoglobin 8.3 after transfusion 6. Positive lupus anticoagulant. Followup per hematology services  Pt seen and examined 05/27/2012, documentation completed 05/28/2012 7. Reported  hypokalemia: recheck labs stat  -replace as  indicated  LOS (Days) 1 A FACE TO FACE EVALUATION WAS PERFORMED  SWARTZ,ZACHARY T 05/28/2012, 8:11 AM

## 2012-05-28 NOTE — Progress Notes (Signed)
Received a call from lab with panic potassium level of <2 at 1059.  Harvel Ricks, PA notify at 1100.  Order for bedrest, EKG, Potassium 40 mEq BID, and Potassium IVPB running, repeat BMP at 1500.   Patient is tolerating medication well at this time, and will continue with plan of care.

## 2012-05-28 NOTE — Evaluation (Signed)
Physical Therapy Assessment and Plan  Patient Details  Name: [name] [name] MRN: [id number] Date of Birth: December 14, 1955  PT Diagnosis: Abnormal posture, Abnormality of gait, Difficulty walking, Low back pain and Muscle weakness Rehab Potential: Good ELOS: 5-7 days   Today's Date: 05/28/2012 Time: 6213-0865 Time Calculation (min): 65 min  Problem List:  Patient Active Problem List  Diagnosis  . Pulmonary embolism, bilateral- moderate clot burden  . DVT of left lower limb, acute  . Dehydration with hyponatremia  . Anemia  . Hypokalemia  . ARF (acute renal failure)- baseline creatinine 0.55  . Tobacco abuse  . HTN (hypertension)  . History of back surgery  . Leukocytosis  . Sinus tachycardia  . Sepsis(995.91)  . Lower GI bleed  . DVT of leg (deep venous thrombosis)  . Acute blood loss anemia  . Hypercoagulation syndrome  . Iron deficiency  . Hyponatremia  . Constipation, chronic  . DVT, recurrent, lower extremity, acute- history of LLE DVT 04/2012  . Thrombocytosis  . Fever  . Physical deconditioning  . Severe muscle deconditioning    Past Medical History:  Past Medical History  Diagnosis Date  . DVT (deep venous thrombosis)   . Pulmonary embolism   . Back pain    Past Surgical History:  Past Surgical History  Procedure Date  . Back surgery   . Colonoscopy 05/19/2012    Procedure: COLONOSCOPY;  Surgeon: Theda Belfast, MD;  Location: [facility] ENDOSCOPY;  Service: Endoscopy;  Laterality: N/A;  . Esophagogastroduodenoscopy 05/19/2012    Procedure: ESOPHAGOGASTRODUODENOSCOPY (EGD);  Surgeon: Theda Belfast, MD;  Location: [facility] ENDOSCOPY;  Service: Endoscopy;  Laterality: N/A;    Assessment & Plan Clinical Impression: [name] [name] is a 56 y.o. right-handed female with history of spinal fusion surgery at [facility] 03/26/2012, subsequently had left leg DVT with bilateral pulmonary embolism the first week of September and was discharged on Lovenox and Coumadin on 04/23/2012.  Patient followed up with her PCP with noted INR of 2.4. She developed nausea vomiting as well as bright red blood per rectum as well as developing pain to the right lower extremity. In the workup showed hemoglobin 6.9 with guaiac stool positive, hypotensive and orthostatic with lowest blood pressure 91/59 and tachycardic as well as leukocytosis 21,400. She was admitted 05/18/2012 for ongoing evaluation. Venous Doppler studies right lower extremity showed deep vein thrombosis right saphenofemoral junction, common femoral, femoral, popliteal and posterior tibial vein. Underwent placement of IVC filter, status post angioplasty, from the lysis and mechanical thrombectomy bilateral lower extremity stent placement 05/23/2012 per interventional radiology. Hematology consult at and request for hypercoagulation workup as concerned that patient developed DVT and pulmonary emboli in setting of therapeutic INR on Coumadin now with IVC filter. Dr. Dalene Carrow hematology services for lupus anticoagulant positive and presently advised to continue subcutaneous Lovenox and consider initiating Coumadin as an outpatient. Goal INR at that time will be 3.0-3.5. Followup in gastroenterology for anemia with EGD colonoscopy 05/19/2012 that was unremarkable except for some internal and external hemorrhoids. There was a rather deep anal fissure which was felt to be the source of her hematochezia but no evidence of blood in the colon. Patient transferred to CIR on 05/27/2012 .   Patient currently requires min assist with mobility secondary to muscle weakness, decreased cardiorespiratoy endurance, impaired timing and sequencing and decreased standing balance and decreased balance strategies. Pt has impaired balance (Berg Balance Test = 29/56 (although unable complete full test secondary to recent back surgery). Will benefit from improved  stability with mobility prior to D/C as she will only have intermittent supervision.  Prior to  hospitalization, patient was independent with mobility and lived with Spouse in a House home.  Home access is  Level entry.  Patient will benefit from skilled PT intervention to maximize safe functional mobility, minimize fall risk and decrease caregiver burden for planned discharge home with intermittent assist.  Anticipate patient will benefit from follow up [facility] at discharge.  PT - End of Session Endurance Deficit: Yes Endurance Deficit Description: Limited ambulation distance, elevated HR with minimal activity PT Assessment Rehab Potential: Good Barriers to Discharge: Decreased caregiver support PT Plan PT Frequency: 2-3 X/day, 60-90 minutes Estimated Length of Stay: 5-7 days PT Treatment/Interventions: Warden/ranger;Ambulation/gait training;Discharge planning;Community reintegration;DME/adaptive equipment instruction;Functional mobility training;Neuromuscular re-education;Pain management;Patient/family education;Psychosocial support;Stair training;Therapeutic Activities;Therapeutic Exercise;UE/LE Strength taining/ROM;UE/LE Coordination activities PT Recommendation Follow Up Recommendations: Outpatient PT;Home health PT (pending pt preference) Equipment Recommended: None recommended by PT  PT Evaluation Precautions/Restrictions Precautions Precautions: Fall Precaution Comments: Pt reports no lifting, bending to pick something up from floor secondary to back surgery.  Vital Signs Therapy Vitals Temp: 98.3 F (36.8 C) Temp src: Oral Pulse Rate: 128  (up to this with gait, with rest down to 110) Resp: 20  BP: 145/82 mmHg Patient Position, if appropriate: Sitting Oxygen Therapy SpO2: 97 % O2 Device: None (Room air) Pain Pain Assessment Pain Assessment: 0-10 Pain Score:   3 Pain Type: Chronic pain Pain Location: Back Pain Orientation: Upper Pain Descriptors: Aching Pain Onset: Other (Comment) (pt reports from the hospital bed) Pain Intervention(s):  Repositioned Home Living/Prior Functioning Home Living Lives With: Spouse Available Help at Discharge: Available PRN/intermittently;Other (Comment) (available in mornings and evenings (works full time)) Type of Home: House Home Access: Level entry Home Layout: One level (small step up from bedroom to kitchen and bedroom) Bathroom Shower/Tub: Health visitor: Standard Bathroom Accessibility: Yes How Accessible: Accessible via walker Home Adaptive Equipment: Walker - standard Prior Function Level of Independence: Independent with basic ADLs;Independent with gait Able to Take Stairs?: Yes Driving: Yes Vocation: Full time employment Vocation Requirements: Saleswoman, 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 LEs from significant edema) Proprioception: Appears Intact Coordination Gross Motor Movements are Fluid and Coordinated: Yes Fine Motor Movements are Fluid and Coordinated: Yes  Mobility Bed Mobility Bed Mobility: Supine to Sit;Sit to Supine Supine to Sit: 5: Supervision Supine to Sit Details (indicate cue type and reason): Cues required for log roll technique for back preservation. Decreased efficiency noted Sit to Supine: 5: Supervision Sit to Supine - Details (indicate cue type and reason): Cues for log roll technique.  Transfers Sit to Stand: 4: Min assist;5: Supervision Sit to Stand Details (indicate cue type and reason): Min assist from lower surfaces, supervision from wheelchair and higher surfaces.  Stand to Sit: 5: Supervision Stand to Sit Details: Cues for safe UE placement Locomotion  Ambulation Ambulation: Yes Ambulation/Gait Assistance: 4: Min assist Ambulation Distance (Feet): 60  Feet Assistive device: None Ambulation/Gait Assistance Details: Min assist for increased sway, one significant loss of balance with 180 degree turn requiring assist to correct.  Gait Gait: Yes Gait Pattern: Step-through pattern;Decreased stride length;Wide base of support (Decreased foot clearance bil.) Gait velocity: 2.4 meter/sec Stairs / Additional Locomotion Stairs: Yes Stairs Assistance: 4: Min guard Stair Management Technique: Two  rails;Alternating pattern Number of Stairs: 2  Wheelchair Mobility Wheelchair Mobility: Yes Wheelchair Assistance: 5: Investment banker, operational: Both upper extremities Wheelchair Parts Management: Supervision/cueing Distance: 150'   Balance Standardized Balance Assessment Standardized Balance Assessment: Dealer Test Sit to Stand: Needs minimal aid to stand or to stabilize Standing Unsupported: Able to stand 2 minutes with supervision Sitting with Back Unsupported but Feet Supported on Floor or Stool: Able to sit safely and securely 2 minutes Stand to Sit: Sits safely with minimal use of hands Transfers: Able to transfer safely, definite need of hands Standing Unsupported with Eyes Closed: Able to stand 10 seconds with supervision Standing Ubsupported with Feet Together: Able to place feet together independently but unable to hold for 30 seconds From Standing, Reach Forward with Outstretched Arm: Can reach forward >12 cm safely (5") From Standing Position, Pick up Object from Floor: Unable to try/needs assist to keep balance (not performed secondary to back surgery) From Standing Position, Turn to Look Behind Over each Shoulder: Needs supervision when turning Turn 360 Degrees: Needs close supervision or verbal cueing Standing Unsupported, Alternately Place Feet on Step/Stool: Able to complete >2 steps/needs minimal assist Standing Unsupported, One Foot in Front: Needs help to step but can hold 15 seconds Standing on  One Leg: Able to lift leg independently and hold equal to or more than 3 seconds Total Score: 29  Dynamic Standing Balance Dynamic Standing - Balance Support: No upper extremity supported Dynamic Standing - Level of Assistance: 4: Min assist Extremity Assessment      RLE Assessment RLE Assessment: Exceptions to [facility] RLE AROM (degrees) RLE Overall AROM Comments: Limited by edema RLE Strength RLE Overall Strength Comments: Generalized deconditioning, grossly >4-/5 LLE Assessment LLE Assessment: Exceptions to WFL LLE AROM (degrees) LLE Overall AROM Comments: Limited by edema LLE Strength LLE Overall Strength Comments: Generalized deconditioning, grossly >4-/5  See FIM for current functional status Refer to Care Plan for Long Term Goals  Skilled Therapeutic Interventions/Progress Updates:  Performed dynamic gait with head movements horizontal/vertical with min assist secondary increased lateral sway. Practiced transfers with cues for safety. Discussed expectations on rehab and discussed D/C plan with pt.   Recommendations for other services: None  Discharge Criteria: Patient will be discharged from PT if patient refuses treatment 3 consecutive times without medical reason, if treatment goals not met, if there is a change in medical status, if patient makes no progress towards goals or if patient is discharged from hospital.  The above assessment, treatment plan, treatment alternatives and goals were discussed and mutually agreed upon: by patient  [name] [name] 05/28/2012, 8:57 AM

## 2012-05-28 NOTE — Progress Notes (Signed)
Patient arrived onto unit in wheelchair with nurse, and husband. Patient oriented to room, given admission information, and questions answered.

## 2012-05-28 NOTE — Progress Notes (Signed)
Patient information reviewed and entered into eRehab system by Sherryann Frese, RN, CRRN, PPS Coordinator.  Information including medical coding and functional independence measure will be reviewed and updated through discharge.    

## 2012-05-28 NOTE — Progress Notes (Signed)
Additional order of potassium 40 meq now  as per Dr. Riley Kill. (and  40 meq of potassium at 22:00)

## 2012-05-28 NOTE — Progress Notes (Signed)
Lab reported critical level of potassium level of 2.7 at 19:45. Dr. Riley Kill notified at 1948. New order : 40 meq of potassium at 22:00 pm and BMP tomorrow( 05/29/12) morning and hold Lasix until Dr. Riley Kill see potassium  result tomorrow(05/29/2012).

## 2012-05-28 NOTE — Evaluation (Signed)
Occupational Therapy Assessment and Plan  Patient Details  Name: [name] [name] MRN: [id number] Date of Birth: 06/29/1956  OT Diagnosis: muscle weakness (generalized) Rehab Potential: Rehab Potential: Good ELOS: 5-7 days   Today's Date: 05/28/2012 Time: 0930-1030   Time Calculation (min): 60 min  Problem List:  Patient Active Problem List  Diagnosis  . Pulmonary embolism, bilateral- moderate clot burden  . DVT of left lower limb, acute  . Dehydration with hyponatremia  . Anemia  . Hypokalemia  . ARF (acute renal failure)- baseline creatinine 0.55  . Tobacco abuse  . HTN (hypertension)  . History of back surgery  . Leukocytosis  . Sinus tachycardia  . Sepsis(995.91)  . Lower GI bleed  . DVT of leg (deep venous thrombosis)  . Acute blood loss anemia  . Hypercoagulation syndrome  . Iron deficiency  . Hyponatremia  . Constipation, chronic  . DVT, recurrent, lower extremity, acute- history of LLE DVT 04/2012  . Thrombocytosis  . Fever  . Physical deconditioning  . Severe muscle deconditioning    Past Medical History:  Past Medical History  Diagnosis Date  . DVT (deep venous thrombosis)   . Pulmonary embolism   . Back pain    Past Surgical History:  Past Surgical History  Procedure Date  . Back surgery   . Colonoscopy 05/19/2012    Procedure: COLONOSCOPY;  Surgeon: Theda Belfast, MD;  Location: [facility] ENDOSCOPY;  Service: Endoscopy;  Laterality: N/A;  . Esophagogastroduodenoscopy 05/19/2012    Procedure: ESOPHAGOGASTRODUODENOSCOPY (EGD);  Surgeon: Theda Belfast, MD;  Location: [facility] ENDOSCOPY;  Service: Endoscopy;  Laterality: N/A;    Assessment & Plan Clinical Impression:  [name] [name] is a 56 y.o. right-handed female with history of spinal fusion surgery at [facility] 03/26/2012, subsequently had left leg DVT with bilateral pulmonary embolism the first week of September and was discharged on Lovenox and Coumadin on 04/23/2012. Patient followed up with her PCP with  noted INR of 2.4. She developed nausea vomiting as well as bright red blood per rectum as well as developing pain to the right lower extremity. In the workup showed hemoglobin 6.9 with guaiac stool positive, hypotensive and orthostatic with lowest blood pressure 91/59 and tachycardic as well as leukocytosis 21,400. She was admitted 05/18/2012 for ongoing evaluation. Venous Doppler studies right lower extremity showed deep vein thrombosis right saphenofemoral junction, common femoral, femoral, popliteal and posterior tibial vein. Underwent placement of IVC filter, status post angioplasty, from the lysis and mechanical thrombectomy bilateral lower extremity stent placement 05/23/2012 per interventional radiology. Hematology consult at and request for hypercoagulation workup as concerned that patient developed DVT and pulmonary emboli in setting of therapeutic INR on Coumadin now with IVC filter. Dr. Dalene Carrow hematology services for lupus anticoagulant positive and presently advised to continue subcutaneous Lovenox and consider initiating Coumadin as an outpatient. Goal INR at that time will be 3.0-3.5. Followup in gastroenterology for anemia with EGD colonoscopy 05/19/2012 that was unremarkable except for some internal and external hemorrhoids. There was a rather deep anal fissure which was felt to be the source of her hematochezia but no evidence of blood in the colon. Physical and Occupational therapy evaluations completed 05/25/2012 and ongoing with noted routine back precautions after recent back surgery . Physical medicine and rehabilitation was consulted at the request of PT and OT to consider inpatient rehabilitation services. Patient was felt to be a good candidate for inpatient rehabilitation services and was admitted for comprehensive rehabilitation program  Patient transferred to CIR on 05/27/2012 .  Patient currently requires min with basic self-care skills secondary to muscle weakness and decreased  cardiorespiratoy endurance.  Prior to hospitalization, patient could complete BADL/IADLwith no assist.  Patient will benefit from skilled intervention to increase level of independence with iADL prior to discharge home with care partner.  Anticipate patient will require minimal physical assistance and follow up home health.  OT - End of Session Activity Tolerance: Tolerates 30+ min activity with multiple rests Endurance Deficit: Yes Endurance Deficit Description: Limited ambulation distance, elevated HR with minimal activity OT Assessment Rehab Potential: Good Barriers to Discharge: Decreased caregiver support OT Plan OT Frequency: 1-2 X/day, 60-90 minutes Estimated Length of Stay: 5-7 days OT Treatment/Interventions: Balance/vestibular training;Community reintegration;DME/adaptive equipment instruction;Functional mobility training;Pain management;Self Care/advanced ADL retraining;Therapeutic Activities;Therapeutic Exercise;UE/LE Strength taining/ROM  OT Evaluation Precautions/Restrictions  Precautions Precautions: Fall Precaution Booklet Issued: No Precaution Comments: Pt reports no lifting, bending to pick something up from floor secondary to back surgery. Restrictions Weight Bearing Restrictions: No General Amount of Missed OT Time (min): 0 Minutes Vital Signs   Pain   Home Living/Prior Functioning Home Living Lives With: Spouse Available Help at Discharge: Available PRN/intermittently;Other (Comment) Type of Home: House Home Access: Level entry Home Layout: One level Bathroom Shower/Tub: Walk-in shower;Door [employer] Toilet: Standard Bathroom Accessibility: Yes How Accessible: Accessible via walker Home Adaptive Equipment: Walker - standard IADL History Homemaking Responsibilities: Yes Meal Prep Responsibility: Primary Laundry Responsibility: Primary Cleaning Responsibility: Primary Shopping Responsibility: Primary Child Care Responsibility: No Current License:  Yes Mode of Transportation: Car Occupation: Full time employment Prior Function Level of Independence: Independent with homemaking with ambulation;Independent with basic ADLs Able to Take Stairs?: Yes Driving: Yes Vocation: Full time employment Vocation Requirements: Saleswoman, 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) Leisure: Hobbies-yes (Comment) Comments: Fishing, cards ADL ADL Toilet Transfer: Close supervision Toilet Transfer Method: Surveyor, minerals: Engineer, technical sales: Insurance underwriter: Close supervision Film/video editor Method: Warden/ranger: Visual merchandiser Vision/Perception  Vision - History Baseline Vision: No visual deficits  Cognition Overall Cognitive Status: Appears within functional limits for tasks assessed Sensation Sensation Light Touch: Appears Intact Proprioception: Appears Intact Coordination Gross Motor Movements are Fluid and Coordinated: Yes Fine Motor Movements are Fluid and Coordinated: Yes Motor  Motor Motor: Within Functional Limits Mobility  Bed Mobility Bed Mobility: Rolling Right;Rolling Left;Supine to Sit Supine to Sit: 5: Supervision Transfers Sit to Stand: 4: Min assist;5: Supervision Stand to Sit: 5: Supervision  Trunk/Postural Assessment  Cervical Assessment Cervical Assessment: Within Functional Limits Thoracic Assessment Thoracic Assessment: Within Functional Limits Lumbar Assessment Lumbar Assessment: Within Functional Limits Postural Control Postural Control: Within Functional Limits  Balance Dynamic Standing Balance Dynamic Standing - Balance Support: No upper extremity supported Dynamic Standing - Level of Assistance: 4: Min assist Extremity/Trunk Assessment RUE Assessment RUE Assessment: Within Functional Limits LUE Assessment LUE Assessment: Within Functional Limits  See FIM for current functional status Refer to  Care Plan for Long Term Goals  Recommendations for other services: None  Discharge Criteria: Patient will be discharged from OT if patient refuses treatment 3 consecutive times without medical reason, if treatment goals not met, if there is a change in medical status, if patient makes no progress towards goals or if patient is discharged from hospital.  The above assessment, treatment plan, treatment alternatives and goals were discussed and mutually agreed upon: by patient  Humberto Seals 05/28/2012, 12:55 PM

## 2012-05-29 ENCOUNTER — Inpatient Hospital Stay (HOSPITAL_COMMUNITY): Payer: BC Managed Care – PPO | Admitting: Occupational Therapy

## 2012-05-29 ENCOUNTER — Inpatient Hospital Stay (HOSPITAL_COMMUNITY): Payer: BC Managed Care – PPO | Admitting: *Deleted

## 2012-05-29 ENCOUNTER — Inpatient Hospital Stay (HOSPITAL_COMMUNITY): Payer: BC Managed Care – PPO | Admitting: Physical Therapy

## 2012-05-29 LAB — BASIC METABOLIC PANEL
BUN: <3 mg/dL — ABNORMAL LOW (ref 6–23)
CO2: 31 mEq/L (ref 19–32)
Calcium: 8.9 mg/dL (ref 8.4–10.5)
Chloride: 94 mEq/L — ABNORMAL LOW (ref 96–112)
Creatinine, Ser: 0.37 mg/dL — ABNORMAL LOW (ref 0.50–1.10)
GFR calc Af Amer: >90 mL/min (ref >90)
GFR calc non Af Amer: >90 mL/min (ref >90)
Glucose, Bld: 99 mg/dL (ref 70–99)
Potassium: 3.1 mEq/L — ABNORMAL LOW (ref 3.5–5.1)
Sodium: 138 mEq/L (ref 135–145)

## 2012-05-29 MED ORDER — POTASSIUM CHLORIDE CRYS ER 20 MEQ PO TBCR
40.0000 meq | EXTENDED_RELEASE_TABLET | Freq: Two times a day (BID) | ORAL | Status: DC
  Administered 2012-05-29 – 2012-05-30 (×3): 40 meq via ORAL
  Filled 2012-05-29 (×5): qty 2

## 2012-05-29 NOTE — Progress Notes (Signed)
Occupational Therapy Session Note  Patient Details  Name: [name] [name] MRN: [id number] Date of Birth: 1955/10/13  Today's Date: 05/29/2012 Time: 4098-1191 and 1545 to 1725 Time Calculation (min): 45 minand 95 min=140 min  Skilled Therapeutic Interventions/Progress Updates:  AM session:  Patient scheduled for ADL but b/dressed earlier in the day.  Patient willing to practice AE (reacher and sock aide) skills though already dressed.  Patient able to use reacher and sock aide to don sock though today she stated she was able to don sock and pants earlier independently  PM Session: Though patient c/o bilateral leg cramps and therapist suggested functional ambulation for home skills, etc, she concurred to complete home skills and worked through the leg cramps as she was able to tolerate.  Session was concluded with direct stretching and addressing patient R tight back muscles and trigger points such as subscapularis, rhomboids, trapezoids, etc. To increase self care and functional use of bilateral UEs and pain reduction to increase self care skills.  Therapy Documentation Precautions:  Precautions Precautions: Fall Precaution Booklet Issued: No Precaution Comments: Pt reports no lifting, bending to pick something up from floor secondary to back surgery. Restrictions Weight Bearing Restrictions: No   Pain:denied  See FIM for current functional status  Therapy/Group: Individual Therapy  [facility] 05/29/2012, 3:21 PM

## 2012-05-29 NOTE — Progress Notes (Signed)
Subjective/Complaints: A little anxious about being here. "i'm ready to go home--ive been here too long"  Objective: Vital Signs: Blood pressure 145/71, pulse 105, temperature 97.9 F (36.6 C), temperature source Oral, resp. rate 18, weight 79.9 kg (176 lb 2.4 oz), SpO2 99.00%. No results found.  Basename 05/28/12 0625 05/27/12 0450  WBC 8.3 7.2  HGB 9.4* 7.3*  HCT 28.3* 21.8*  PLT 553* 517*    Basename 05/28/12 1828 05/28/12 1511  NA 139 140  K 2.7* 2.6*  CL 93* 92*  CO2 34* 36*  GLUCOSE 118* 127*  BUN <3* <3*  CREATININE 0.34* 0.40*  CALCIUM 8.7 8.6   CBG (last 3)  No results found for this basename: GLUCAP:3 in the last 72 hours  Wt Readings from Last 3 Encounters:  05/27/12 79.9 kg (176 lb 2.4 oz)  05/19/12 76 kg (167 lb 8.8 oz)  05/19/12 76 kg (167 lb 8.8 oz)    Physical Exam:  Constitutional: She is oriented to person, place, and time. She appears well-developed.  HENT:  Head: Normocephalic.  Eyes:  Pupils round reactive to light  Neck: Neck supple. No thyromegaly present.  Cardiovascular: Normal rate and regular rhythm.  Pulmonary/Chest: Effort normal and breath sounds normal. No respiratory distress. She has no wheezes.  Abdominal: Soft. Bowel sounds are normal. She exhibits no distension. There is no tenderness.  Musculoskeletal: She exhibits edema.  +2 edema lower extremities tenderness to palpation R inguinal area o, no calf tenderness Neurological: She is alert and oriented to person, place, and time. No cranial nerve deficit or sensory deficit.  Pt with weakness in both legs, grossly 2/5 in HF,KE, 3/5 in ankle DF/PF  5/5 in Bilat delt, bi tri grip  Skin:  Back incision is healed  Psychiatric: She has a normal mood and affect. Her behavior is normal. Judgment and thought content normal.      Assessment/Plan: 1. Functional deficits secondary to deconditioning/DVT after lumbar spine surgery which require 3+ hours per day of interdisciplinary therapy  in a comprehensive inpatient rehab setting. Physiatrist is providing close team supervision and 24 hour management of active medical problems listed below. Physiatrist and rehab team continue to assess barriers to discharge/monitor patient progress toward functional and medical goals.  Moving well. Should be able to go home by the beginning o f the week. FIM:       FIM - Toileting Toileting steps completed by patient: Adjust clothing prior to toileting;Performs perineal hygiene;Adjust clothing after toileting Toileting Assistive Devices: Grab bar or rail for support Toileting: 4: Steadying assist  FIM - Diplomatic Services operational officer Devices: Grab bars Toilet Transfers: 6-More than reasonable amt of time  FIM - Press photographer: 5: Supine > Sit: Supervision (verbal cues/safety issues);5: Sit > Supine: Supervision (verbal cues/safety issues);4: Bed > Chair or W/C: Min A (steadying Pt. > 75%);4: Chair or W/C > Bed: Min A (steadying Pt. > 75%)  FIM - Locomotion: Wheelchair Distance: 150' Locomotion: Wheelchair: 5: Travels 150 ft or more: maneuvers on rugs and over door sills with supervision, cueing or coaxing FIM - Locomotion: Ambulation Ambulation/Gait Assistance: 4: Min assist Locomotion: Ambulation: 2: Travels 50 - 149 ft with minimal assistance (Pt.>75%)  Comprehension Comprehension Mode: Auditory Comprehension: 7-Follows complex conversation/direction: With no assist  Expression Expression Mode: Verbal Expression: 7-Expresses complex ideas: With no assist  Social Interaction Social Interaction: 7-Interacts appropriately with others - No medications needed.  Problem Solving Problem Solving: 7-Solves complex problems: Recognizes & self-corrects  Memory  Memory: 7-Complete Independence: No helper  Medical Problem List and Plan:  1. Recent back surgery status post spinal fusion at [facility] 03/26/2012 complicated by DVT/pulmonary  emboli/deconditioning  2. DVT Prophylaxis/Anticoagulation: Subcutaneous Lovenox and plan to transition to Coumadin as outpatient. Monitor for a bleeding episodes. IVC filter also in place 05/23/2012   -TEDS, elevation of legs to help control edema 3. Pain Management: Vicodin as needed. Monitor with increased mobility  4. Neuropsych: This patient is capable of making decisions on his/her own behalf.  5. Anemia/hematochezia. Followup CBC. Gastroenterology workup with findings of deep anal fissure and EGD colonoscopy unremarkable. Latest hemoglobin 8.3 after transfusion 6. Positive lupus anticoagulant. Followup per hematology services  Pt seen and examined 05/27/2012, documentation completed 05/28/2012 7. Hypokalemia: iv and po replacement. Recheck again this am  -no more lasix LOS (Days) 2 A FACE TO FACE EVALUATION WAS PERFORMED  SWARTZ,ZACHARY T 05/29/2012, 6:42 AM

## 2012-05-29 NOTE — Progress Notes (Signed)
Physical Therapy Session Note  Patient Details  Name: [name] [name] MRN: [id number] Date of Birth: [birth year]  Today's Date: 05/29/2012 Time: 1300-1355 Time Calculation (min): 55 min  Short Term Goals: Week 1:  PT Short Term Goal 1 (Week 1): = long term goals  Skilled Therapeutic Interventions/Progress Updates:   Per RN report patient K+ level improved to 3.1 and patient now cleared for activity.  Patient performed bed mobility mod I with attention to back precautions.  Performed ambulation on unit x 150' + 69' + 61' with min A with slight lateral LOB secondary to LE edema and weakness.  Discussed with patient home set up; patient reports no STE but has multiple one step ups throughout house between rooms.  Performed higher level gait training with obstacle course x 3 reps up and down one platform step initially with no UE support but min A but changed to one HHA secondary to patient reporting various things in her home she can hold to for balance, stepping over low obstacle, across compliant/uneven surface and up and down 5 steps with 2 rails alternating sequence with min A; patient reporting LE fatigue so allowed to rest.  Patient verbalized and demonstrated safe tall simulated car transfer sitting first and then bringing LE into and out of car with supervision.  Patient reports that she may not be able to attend outpatient PT if insurance does not cover it; educated patient on OTAGO HEP for LE strength and balance, handout given and each exercise reviewed.  Patient completed strengthening exercises with bilat UE support but unable to complete balance exercises secondary to LE fatigue/cramping.  Returned to room in w/c; patient transferred to bed with supervision.  Therapy Documentation Precautions:  Precautions Precautions: Fall Precaution Booklet Issued: No Precaution Comments: Pt reports no lifting, bending to pick something up from floor secondary to back surgery. Restrictions Weight  Bearing Restrictions: No Pain: Pain Assessment Pain Assessment: No/denies pain Locomotion : Ambulation Ambulation/Gait Assistance: 4: Min guard   See FIM for current functional status  Therapy/Group: Individual Therapy  Edman Circle Faucette 05/29/2012, 1:53 PM

## 2012-05-30 ENCOUNTER — Inpatient Hospital Stay (HOSPITAL_COMMUNITY): Payer: BC Managed Care – PPO | Admitting: Occupational Therapy

## 2012-05-30 LAB — BASIC METABOLIC PANEL
BUN: <3 mg/dL — ABNORMAL LOW (ref 6–23)
CO2: 29 mEq/L (ref 19–32)
Calcium: 9 mg/dL (ref 8.4–10.5)
Chloride: 97 mEq/L (ref 96–112)
Creatinine, Ser: 0.36 mg/dL — ABNORMAL LOW (ref 0.50–1.10)
GFR calc Af Amer: >90 mL/min (ref >90)
GFR calc non Af Amer: >90 mL/min (ref >90)
Glucose, Bld: 90 mg/dL (ref 70–99)
Potassium: 3.2 mEq/L — ABNORMAL LOW (ref 3.5–5.1)
Sodium: 137 mEq/L (ref 135–145)

## 2012-05-30 MED ORDER — POTASSIUM CHLORIDE CRYS ER 20 MEQ PO TBCR
40.0000 meq | EXTENDED_RELEASE_TABLET | Freq: Three times a day (TID) | ORAL | Status: DC
  Administered 2012-05-30 – 2012-05-31 (×3): 40 meq via ORAL
  Filled 2012-05-30 (×6): qty 2

## 2012-05-30 NOTE — Progress Notes (Signed)
Subjective/Complaints: Anxious to go home. Voiding better.  Objective: Vital Signs: Blood pressure 152/78, pulse 94, temperature 97.8 F (36.6 C), temperature source Oral, resp. rate 17, weight 79.9 kg (176 lb 2.4 oz), SpO2 96.00%. No results found.  Basename 05/28/12 0625  WBC 8.3  HGB 9.4*  HCT 28.3*  PLT 553*    Basename 05/29/12 0647 05/28/12 1828  NA 138 139  K 3.1* 2.7*  CL 94* 93*  CO2 31 34*  GLUCOSE 99 118*  BUN <3* <3*  CREATININE 0.37* 0.34*  CALCIUM 8.9 8.7   CBG (last 3)  No results found for this basename: GLUCAP:3 in the last 72 hours  Wt Readings from Last 3 Encounters:  05/27/12 79.9 kg (176 lb 2.4 oz)  05/19/12 76 kg (167 lb 8.8 oz)  05/19/12 76 kg (167 lb 8.8 oz)    Physical Exam:  Constitutional: She is oriented to person, place, and time. She appears well-developed.  HENT:  Head: Normocephalic.  Eyes:  Pupils round reactive to light  Neck: Neck supple. No thyromegaly present.  Cardiovascular: Normal rate and regular rhythm.  Pulmonary/Chest: Effort normal and breath sounds normal. No respiratory distress. She has no wheezes.  Abdominal: Soft. Bowel sounds are normal. She exhibits no distension. There is no tenderness.  Musculoskeletal: She exhibits edema.  +2 edema lower extremities tenderness to palpation R inguinal area o, no calf tenderness--edema decreasing Neurological: She is alert and oriented to person, place, and time. No cranial nerve deficit or sensory deficit.  Pt with weakness in both legs, grossly 2/5 in HF,KE, 3/5 in ankle DF/PF  5/5 in Bilat delt, bi tri grip  Skin:  Back incision is healed  Psychiatric: She has a normal mood and affect. Her behavior is normal. Judgment and thought content normal.      Assessment/Plan: 1. Functional deficits secondary to deconditioning/DVT after lumbar spine surgery which require 3+ hours per day of interdisciplinary therapy in a comprehensive inpatient rehab setting. Physiatrist is  providing close team supervision and 24 hour management of active medical problems listed below. Physiatrist and rehab team continue to assess barriers to discharge/monitor patient progress toward functional and medical goals.  Moving well. Should be able to go home by the beginning o f the week. FIM:       FIM - Toileting Toileting steps completed by patient: Adjust clothing prior to toileting;Performs perineal hygiene;Adjust clothing after toileting Toileting Assistive Devices: Grab bar or rail for support Toileting: 4: Steadying assist  FIM - Diplomatic Services operational officer Devices: Grab bars Toilet Transfers: 6-To toilet/ BSC  FIM - Games developer Transfer: 6: Supine > Sit: No assist;6: Sit > Supine: No assist;5: Bed > Chair or W/C: Supervision (verbal cues/safety issues);5: Chair or W/C > Bed: Supervision (verbal cues/safety issues)  FIM - Locomotion: Wheelchair Distance: 150' Locomotion: Wheelchair: 1: Total Assistance/staff pushes wheelchair (Pt<25%) FIM - Locomotion: Ambulation Ambulation/Gait Assistance: 4: Min guard Locomotion: Ambulation: 4: Travels 150 ft or more with minimal assistance (Pt.>75%)  Comprehension Comprehension Mode: Auditory Comprehension: 7-Follows complex conversation/direction: With no assist  Expression Expression Mode: Verbal Expression: 7-Expresses complex ideas: With no assist  Social Interaction Social Interaction: 7-Interacts appropriately with others - No medications needed.  Problem Solving Problem Solving: 7-Solves complex problems: Recognizes & self-corrects  Memory Memory: 7-Complete Independence: No helper  Medical Problem List and Plan:  1. Recent back surgery status post spinal fusion at [facility] 03/26/2012 complicated by DVT/pulmonary emboli/deconditioning  2. DVT Prophylaxis/Anticoagulation: Subcutaneous Lovenox and plan to  transition to Coumadin as outpatient. Monitor for a bleeding  episodes. IVC filter also in place 05/23/2012   -TEDS, elevation of legs to help control edema  -determine timing of plan upon dc 3. Pain Management: Vicodin as needed. Monitor with increased mobility  4. Neuropsych: This patient is capable of making decisions on his/her own behalf.  5. Anemia/hematochezia. Followup CBC. Gastroenterology workup with findings of deep anal fissure and EGD colonoscopy unremarkable. Latest hemoglobin 8.3 after transfusion 6. Positive lupus anticoagulant. Followup per hematology services   7. Hypokalemia: improving. Recheck again this am  -no more lasix LOS (Days) 3 A FACE TO FACE EVALUATION WAS PERFORMED  Lachrista Heslin T 05/30/2012, 6:26 AM

## 2012-05-30 NOTE — Progress Notes (Signed)
Occupational Therapy Session Note  Patient Details  Name: [name] [name] MRN: [id number] Date of Birth: 06/16/1956  Today's Date: 05/30/2012 Time: 1100-1200 Time Calculation (min): 60 min  Skilled Therapeutic Interventions/Progress Updates: Patient completed NuStep for 13.23 minutes on level 1.4 to increase overall endurance and strength to increase homeskills and self care endurance.  Then she walked in kitchen to demonstrate endurance and safety skills to mix and bake brownies before she stated, "My legs are given out.  I need to get off my feet."    Patient laid on bed in ADL apartment for a few minutes to relieve pressure in her legs and then returned w/c level to her home.     Therapy Documentation Precautions:  Precautions Precautions: Fall Precaution Booklet Issued: No Precaution Comments: Pt reports no lifting, bending to pick something up from floor secondary to back surgery. Restrictions Weight Bearing Restrictions: No  Pain:denied   See FIM for current functional status  Therapy/Group: Individual Therapy  [facility] 05/30/2012, 2:39 PM

## 2012-05-31 ENCOUNTER — Inpatient Hospital Stay (HOSPITAL_COMMUNITY): Payer: BC Managed Care – PPO | Admitting: Physical Therapy

## 2012-05-31 ENCOUNTER — Inpatient Hospital Stay (HOSPITAL_COMMUNITY): Payer: BC Managed Care – PPO | Admitting: Occupational Therapy

## 2012-05-31 ENCOUNTER — Encounter (HOSPITAL_COMMUNITY): Payer: BC Managed Care – PPO | Admitting: Occupational Therapy

## 2012-05-31 DIAGNOSIS — R5381 Other malaise: Secondary | ICD-10-CM

## 2012-05-31 DIAGNOSIS — Q762 Congenital spondylolisthesis: Secondary | ICD-10-CM

## 2012-05-31 DIAGNOSIS — I2699 Other pulmonary embolism without acute cor pulmonale: Secondary | ICD-10-CM

## 2012-05-31 DIAGNOSIS — M47817 Spondylosis without myelopathy or radiculopathy, lumbosacral region: Secondary | ICD-10-CM

## 2012-05-31 DIAGNOSIS — Z5189 Encounter for other specified aftercare: Secondary | ICD-10-CM

## 2012-05-31 LAB — BASIC METABOLIC PANEL
BUN: <3 mg/dL — ABNORMAL LOW (ref 6–23)
CO2: 23 mEq/L (ref 19–32)
Calcium: 9.1 mg/dL (ref 8.4–10.5)
Chloride: 97 mEq/L (ref 96–112)
Creatinine, Ser: 0.43 mg/dL — ABNORMAL LOW (ref 0.50–1.10)
GFR calc Af Amer: >90 mL/min (ref >90)
GFR calc non Af Amer: >90 mL/min (ref >90)
Glucose, Bld: 104 mg/dL — ABNORMAL HIGH (ref 70–99)
Potassium: 4 mEq/L (ref 3.5–5.1)
Sodium: 135 mEq/L (ref 135–145)

## 2012-05-31 MED ORDER — LISINOPRIL 10 MG PO TABS
10.0000 mg | ORAL_TABLET | Freq: Every day | ORAL | Status: DC
  Administered 2012-05-31: 10 mg via ORAL
  Filled 2012-05-31 (×2): qty 1

## 2012-05-31 MED ORDER — PANTOPRAZOLE SODIUM 40 MG PO TBEC: 40.0000 mg | DELAYED_RELEASE_TABLET | Freq: Two times a day (BID) | ORAL | Status: DC

## 2012-05-31 MED ORDER — LISINOPRIL 10 MG PO TABS: 10.0000 mg | ORAL_TABLET | Freq: Every day | ORAL | Status: DC

## 2012-05-31 MED ORDER — ENOXAPARIN SODIUM 150 MG/ML ~~LOC~~ SOLN: 80.0000 mg | Freq: Two times a day (BID) | SUBCUTANEOUS | Status: DC

## 2012-05-31 NOTE — Progress Notes (Signed)
Physical Therapy Discharge Summary  Patient Details  Name: [name] [name] MRN: [id number] Date of Birth: September 02, 1955  Today's Date: 05/31/2012 Time: 0725-0820 Time Calculation (min): 55 min  Pt eager to go home today, much improved endurance and balance noted. Ambulation controlled environment x 150', 200', 75', 80' with modified independence and no loss of balance. Home environment ambulation x 60' with modified independence over carpet walking forwards, backwards, sidestepping and performing sit <> stands from variety height surfaces. Simulated community environment ambulation over compliant/uneven surfaces curb and ramp performed with modified independence x 200'. Bed mobility on actual bed modified independent. 15 steps performed with modified independence. Car transfer performed with modified independence. Verbally reviewed HEP, pt reports she has already started performing exercise program and "loves her exercises." Discussed activity tolerance and slow increase in activity at home however importance of avoiding sitting or being in bed too much secondary to her high risk for blood clots. Pt verbalized understanding. Pt made modified independent in room, educated pt on her risk for falls and need for precaution.   Patient has met 7 of 7 long term goals due to improved activity tolerance, improved balance and ability to compensate for deficits.  Patient to discharge at an ambulatory level Modified Independent.   Patient's care partner reported to be independent however was not available for education to provide the necessary physical assistance at discharge Reasons goals not met: NA  Recommendation:  Patient will benefit from ongoing skilled PT services in outpatient setting (pending back surgeon recommendations) to continue to advance safe functional mobility, address ongoing impairments in decreased endurance and decreased ability to perform at prior level of function, and minimize fall  risk.  Equipment: No equipment provided  Reasons for discharge: treatment goals met and discharge from hospital  Patient/family agrees with progress made and goals achieved: Yes  PT Discharge Precautions/Restrictions Restrictions Weight Bearing Restrictions: No Vital Signs Therapy Vitals Temp: 98.2 F (36.8 C) Temp src: Oral Pulse Rate: 126  (with activity) Resp: 18  BP: 142/81 mmHg Patient Position, if appropriate: Standing Pain Pain Assessment Pain Assessment: No/denies pain Pain Score:   3 Pain Type: Acute pain;Chronic pain Pain Location: Back Pain Orientation: Mid;Lower Pain Descriptors: Aching Pain Onset: On-going Pain Intervention(s): Repositioned   Cognition Orientation Level: Oriented X4 Memory: Appears intact Awareness: Appears intact Problem Solving: Appears intact Safety/Judgment: Appears intact Sensation Sensation Light Touch: Appears Intact Proprioception: Appears Intact Coordination Gross Motor Movements are Fluid and Coordinated: Yes Fine Motor Movements are Fluid and Coordinated: Yes  Mobility Bed Mobility Supine to Sit: 6: Modified independent (Device/Increase time) Supine to Sit Details (indicate cue type and reason): Pt demontrating log roll technique Sit to Supine: 6: Modified independent (Device/Increase time) Sit to Supine - Details (indicate cue type and reason): pt demonstrating log roll technique appropriately Transfers Sit to Stand: 6: Modified independent (Device/Increase time) Stand to Sit: 6: Modified independent (Device/Increase time) Locomotion  Ambulation Ambulation: Yes Ambulation/Gait Assistance: 6: Modified independent (Device/Increase time) Ambulation Distance (Feet): 200 Feet Gait Gait: Yes Gait Pattern: Wide base of support (secondary to excess LE edema) High Level Ambulation High Level Ambulation: Side stepping;Backwards walking;Direction changes;Sudden stops;Head turns Side Stepping: modified  independent Backwards Walking: modified independent Direction Changes: modified independent Sudden Stops: modified independent Head Turns: modified independent Stairs / Additional Locomotion Stairs: Yes Stairs Assistance: 6: Modified independent (Device/Increase time) Stair Management Technique: One rail Left Number of Stairs: 15  Ramp: 6: Modified independent (Device) Curb: 6: Modified independent (Device/increase time) Naval architect Mobility:  No   Balance Standardized Balance Assessment Standardized Balance Assessment: Berg Balance Test Berg Balance Test Sit to Stand: Able to stand without using hands and stabilize independently Standing Unsupported: Able to stand safely 2 minutes Sitting with Back Unsupported but Feet Supported on Floor or Stool: Able to sit safely and securely 2 minutes Stand to Sit: Sits safely with minimal use of hands Transfers: Able to transfer safely, minor use of hands Standing Unsupported with Eyes Closed: Able to stand 10 seconds safely Standing Ubsupported with Feet Together: Able to place feet together independently and stand 1 minute safely From Standing, Reach Forward with Outstretched Arm: Can reach confidently >25 cm (10") From Standing Position, Pick up Object from Floor: Unable to try/needs assist to keep balance (secondary back precautions) From Standing Position, Turn to Look Behind Over each Shoulder: Looks behind from both sides and weight shifts well Turn 360 Degrees: Able to turn 360 degrees safely in 4 seconds or less Standing Unsupported, Alternately Place Feet on Step/Stool: Able to stand independently and safely and complete 8 steps in 20 seconds Standing Unsupported, One Foot in Front: Able to place foot tandem independently and hold 30 seconds Standing on One Leg: Able to lift leg independently and hold 5-10 seconds Total Score: 51  Extremity Assessment      RLE Assessment RLE Assessment: Exceptions to [facility] RLE  AROM (degrees) RLE Overall AROM Comments: Limited by edema RLE Strength RLE Overall Strength Comments: Generalized deconditioning, grossly >4/5 LLE Assessment LLE Assessment: Exceptions to WFL LLE AROM (degrees) LLE Overall AROM Comments: Limited by edema LLE Strength LLE Overall Strength Comments: Generalized deconditioning, grossly >4/5  See FIM for current functional status  Wilhemina Bonito 05/31/2012, 8:28 AM

## 2012-05-31 NOTE — Discharge Summary (Signed)
NAMEDEANNA, [name] NO.:  [id number]  MEDICAL RECORD NO.:  [id number]  LOCATION:  4035                         FACILITY:  [facility]  PHYSICIAN:  Ranelle Oyster, M.D.DATE OF BIRTH:  Mar 16, 1956  DATE OF ADMISSION:  05/28/2012 DATE OF DISCHARGE:  05/31/2012                              DISCHARGE SUMMARY   DISCHARGE DIAGNOSES: 1. Recent back surgery status post spinal fusion complicated by deep     venous thrombosis, pulmonary emboli, and deconditioning. 2. Subcutaneous Lovenox for history of deep venous thrombosis,     pulmonary emboli. 3. Positive lupus anticoagulant, pain management, anemia. 4. Hypokalemia. Resolved This is a 56 year old right-handed female with history of spinal fusion surgery at [facility] March 26, 2012, subsequently had left leg DVT with bilateral pulmonary embolism in the first week of September and was discharged home on Lovenox and Coumadin April 23, 2012.  The patient followed up with her PCP for noted INR of 2.4.  She developed nausea, vomiting as well as bright red blood per rectum as well as pain in right lower extremity.  The workup showed hemoglobin 6.9 with guaiac stool positive, hypotensive and orthostatic with pressure 91/59 and tachycardic as well as leukocytosis 21,400.  She was admitted May 18, 2012 for ongoing evaluation.  Venous Doppler studies, right lower extremity showed DVT right saphenofemoral junction, common femoral, femoral popliteal, and posterior tibial vein.  Underwent placement of IVC filter, status post angioplasty from the lysis and mechanical thrombectomy bilateral lower extremity stent placement May 23, 2012 per Interventional Radiology.  Hematology consulted at the request for hypercoagulation workup, concerned the patient developed DVT and pulmonary emboli in setting of therapeutic INR on Coumadin now with IVC filter.  Dr. Dalene Carrow of Hematology Services for lupus coagulant positive and  presently advised to continue subcutaneous Lovenox.  Consider initiating Coumadin as an outpatient.  Goal INR at that time would be 3.0-3.5.  Followup Gastroenterology Services for anemia with EGD colonoscopy May 19, 2012 unremarkable except for some internal and external hemorrhoids.  There was a rather deep anal fissure which felt to be source of her hematochezia but no evidence of blood in the colon. Physical and occupational therapy evaluation is ongoing with recommendation of the physical medicine rehab consult for inpatient rehab services.  PAST MEDICAL HISTORY:  See discharge diagnoses.  SOCIAL HISTORY:  Lives with husband.  Functional history prior to admission is independent.  Functional status upon admission to rehab services was minimal assist for mobility.  PHYSICAL EXAMINATION:  VITAL SIGNS:  Blood pressure 145/82, pulse 99, temperature 98.3, respirations 20. GENERAL:  This was an alert female, in no acute distress. LUNGS:  Clear to auscultation. CARDIAC:  Regular rate and rhythm. ABDOMEN:  Soft, nontender.  Good bowel sounds. EXTREMITIES:  Back incision is well healed.  She moves all extremities.  REHABILITATION HOSPITAL COURSE:  The patient was admitted to inpatient rehab services with therapies initiated on a 3-hour daily basis consisting of physical therapy, occupational therapy, and rehabilitation nursing.  The following issues were addressed during the patient's rehabilitation stay.  Pertaining to Ms. Trotter's recent back surgery, spinal fusion at [facility] complicated by DVT, pulmonary emboli, deconditioning.  The patient remained on subcutaneous Lovenox 75 mg every 12 hours.  Plan to transition back to Coumadin therapy as an outpatient with followup Hematology Services, Dr. Dalene Carrow in reference to positive lupus anticoagulant and a goal INR at that time will be 3.0 to 3.5.  She exhibited no bleeding episodes.  She did have some noted anemia during her  initial hospital workup followed by Dr. Elnoria Howard of Gastroenterology Services, latest hemoglobin 7.6 and monitored.  Noted profound hypokalemia upon admission to rehab services less than 2.0 which did demand 3 rounds of intravenous potassium as well as p.o. potassium 80 mEq with her Lasix being discontinued due to hypokalemia and followup labs showed steady improvement in her potassium to 2.6, 2.7, and 4.0.  She would remain off her Lasix therapy with monitoring of blood pressure.  She would continue with her lisinopril and follow up with her primary care provider in followup for potassium levels.  The patient was independent in her room, ambulating in the hallways with assistance.  She was continent of bowel and bladder.  Strength and endurance continued to improve.  She had no complaints of back pain. She would be discharged to home.  DISCHARGE MEDICATIONS: 1. Subcutaneous Lovenox 75 mg every 12 hours. 2. Lisinopril 10 mg daily. 3. Protonix 40 mg b.i.d. 4. Senokot tablets 1 p.o. daily.    DIET:  Regular.  SPECIAL INSTRUCTIONS:  The patient should follow up with Dr. Dalene Carrow, Hematology Services for direction on when to resume her Coumadin therapy.  She would continue with subcutaneous Lovenox for now.  Goal INR after Coumadin to be resumed will be 3.0 to 3.5.  She would follow up with Dr. Faith Rogue at the outpatient rehab service office as needed; Dr. Jeani Hawking, Gastroenterology Services, call for appointment as needed; Dr. Rudi Heap, medical management.     Mariam Dollar, P.A.   ______________________________ Ranelle Oyster, M.D.    DA/MEDQ  D:  05/31/2012  T:  05/31/2012  Job:  [id number]  cc:   Ranelle Oyster, M.D. Laurice Record, M.D. Jordan Hawks Elnoria Howard, MD Ernestina Penna, M.D.

## 2012-05-31 NOTE — Discharge Summary (Signed)
  diascharge summary job # [id number]

## 2012-05-31 NOTE — Progress Notes (Signed)
Occupational Therapy Discharge Summary  Patient Details  Name: [name] [name] MRN: [id number] Date of Birth: 10/01/1955  Today's Date: 05/31/2012 Time: 8:30-8:55, 25 minutes  Patient is eager to go home as soon as possible, "my bags are packed and I'm ready to go!"  Patient had already completed all self care task without assistance and physical therapy made her Mod I in her room.   Patient has met 7 of 8 long term goals due to improved activity tolerance, improved balance, ability to compensate for deficits and adhereing to back precautions.  Patient to discharge at overall Modified Independent level. Patient is independent guiding caregiver if assistance is needed.  Reasons goals not met: Laundry (Home Management Goal) not addressed secondary to short length of stay.  Recommendation:  No follow up OT recommended at this time.  Equipment: No equipment provided  Reasons for discharge: discharge from hospital  Patient/family agrees with progress made and goals achieved: Yes  OT Discharge Precautions/Restrictions  Precautions Precautions: Fall;Back (Back surgery 03/26/12) Restrictions Weight Bearing Restrictions: No Pain Pain Assessment Pain Assessment: No/denies pain ADL Overall Modified I Vision/Perception  Vision - History Baseline Vision: No visual deficits  Cognition Orientation Level: Oriented X4 Memory: Appears intact Awareness: Appears intact Problem Solving: Appears intact Safety/Judgment: Appears intact Sensation Sensation Light Touch: Appears Intact Proprioception: Appears Intact Coordination Gross Motor Movements are Fluid and Coordinated: Yes Fine Motor Movements are Fluid and Coordinated: Yes Motor  Motor Motor: Within Functional Limits Mobility  Bed Mobility Supine to Sit: 6: Modified independent (Device/Increase time) Supine to Sit Details (indicate cue type and reason): Pt demontrating log roll technique Sit to Supine: 6: Modified independent  (Device/Increase time) Sit to Supine - Details (indicate cue type and reason): pt demonstrating log roll technique appropriately Transfers Sit to Stand: 6: Modified independent (Device/Increase time) Stand to Sit: 6: Modified independent (Device/Increase time)  Trunk/Postural Assessment  Cervical Assessment Cervical Assessment: Within Functional Limits Thoracic Assessment Thoracic Assessment: Within Functional Limits Lumbar Assessment Lumbar Assessment: Exceptions to [facility] Lumbar AROM Overall Lumbar AROM Comments: Limited secondary to patient with back precautions Postural Control Postural Control: Within Functional Limits  Balance Standardized Balance Assessment Standardized Balance Assessment: Berg Balance Test Berg Balance Test Sit to Stand: Able to stand without using hands and stabilize independently Standing Unsupported: Able to stand safely 2 minutes Sitting with Back Unsupported but Feet Supported on Floor or Stool: Able to sit safely and securely 2 minutes Stand to Sit: Sits safely with minimal use of hands Transfers: Able to transfer safely, minor use of hands Standing Unsupported with Eyes Closed: Able to stand 10 seconds safely Standing Ubsupported with Feet Together: Able to place feet together independently and stand 1 minute safely From Standing, Reach Forward with Outstretched Arm: Can reach confidently >25 cm (10") From Standing Position, Pick up Object from Floor: Unable to try/needs assist to keep balance (secondary back precautions) From Standing Position, Turn to Look Behind Over each Shoulder: Looks behind from both sides and weight shifts well Turn 360 Degrees: Able to turn 360 degrees safely in 4 seconds or less Standing Unsupported, Alternately Place Feet on Step/Stool: Able to stand independently and safely and complete 8 steps in 20 seconds Standing Unsupported, One Foot in Front: Able to place foot tandem independently and hold 30 seconds Standing on One  Leg: Able to lift leg independently and hold 5-10 seconds Total Score: 51  Extremity/Trunk Assessment RUE Assessment RUE Assessment: Within Functional Limits LUE Assessment LUE Assessment: Within Functional Limits  See FIM  for current functional status  Rhealyn Cullen 05/31/2012, 11:44 AM

## 2012-05-31 NOTE — Plan of Care (Signed)
Problem: RH Laundry Goal: LTG Patient will perform laundry w/assist, cues (OT) LTG: Patient will perform laundry with assistance, with/without cues (OT).  Outcome: Not Met (add Reason) Laundry not addressed secondary to short length of stay

## 2012-05-31 NOTE — Progress Notes (Signed)
Subjective/Complaints: Anxious to go home. Voiding better. No complaints this am.  Objective: Vital Signs: Blood pressure 144/86, pulse 104, temperature 98.2 F (36.8 C), temperature source Oral, resp. rate 18, weight 79.9 kg (176 lb 2.4 oz), SpO2 100.00%. No results found. No results found for this basename: WBC:2,HGB:2,HCT:2,PLT:2 in the last 72 hours  Basename 05/30/12 0715 05/29/12 0647  NA 137 138  K 3.2* 3.1*  CL 97 94*  CO2 29 31  GLUCOSE 90 99  BUN <3* <3*  CREATININE 0.36* 0.37*  CALCIUM 9.0 8.9   CBG (last 3)  No results found for this basename: GLUCAP:3 in the last 72 hours  Wt Readings from Last 3 Encounters:  05/27/12 79.9 kg (176 lb 2.4 oz)  05/19/12 76 kg (167 lb 8.8 oz)  05/19/12 76 kg (167 lb 8.8 oz)    Physical Exam:  Constitutional: She is oriented to person, place, and time. She appears well-developed.  HENT:  Head: Normocephalic.  Eyes:  Pupils round reactive to light  Neck: Neck supple. No thyromegaly present.  Cardiovascular: Normal rate and regular rhythm.  Pulmonary/Chest: Effort normal and breath sounds normal. No respiratory distress. She has no wheezes.  Abdominal: Soft. Bowel sounds are normal. She exhibits no distension. There is no tenderness.  Musculoskeletal: She exhibits edema.  1+ to +2 edema lower extremities tenderness to palpation R inguinal area o, no calf tenderness--edema decreasing Neurological: She is alert and oriented to person, place, and time. No cranial nerve deficit or sensory deficit.  Pt with weakness in both legs, grossly 3-4/5 in HF,KE, 4/5 in ankle DF/PF  5/5 in Bilat delt, bi tri grip  Skin:  Back incision is healed  Psychiatric: She has a normal mood and affect. Her behavior is normal. Judgment and thought content normal.      Assessment/Plan: 1. Functional deficits secondary to deconditioning/DVT after lumbar spine surgery which require 3+ hours per day of interdisciplinary therapy in a comprehensive  inpatient rehab setting. Physiatrist is providing close team supervision and 24 hour management of active medical problems listed below. Physiatrist and rehab team continue to assess barriers to discharge/monitor patient progress toward functional and medical goals.  Moving well. Can go home today. FIM:       FIM - Toileting Toileting steps completed by patient: Adjust clothing prior to toileting;Performs perineal hygiene;Adjust clothing after toileting Toileting Assistive Devices: Grab bar or rail for support Toileting: 4: Steadying assist  FIM - Diplomatic Services operational officer Devices: Grab bars Toilet Transfers: 4-To toilet/BSC: Min A (steadying Pt. > 75%)  FIM - Bed/Chair Transfer Bed/Chair Transfer: 6: Supine > Sit: No assist;6: Sit > Supine: No assist;5: Bed > Chair or W/C: Supervision (verbal cues/safety issues);5: Chair or W/C > Bed: Supervision (verbal cues/safety issues)  FIM - Locomotion: Wheelchair Distance: 150' Locomotion: Wheelchair: 1: Total Assistance/staff pushes wheelchair (Pt<25%) FIM - Locomotion: Ambulation Ambulation/Gait Assistance: 4: Min guard Locomotion: Ambulation: 4: Travels 150 ft or more with minimal assistance (Pt.>75%)  Comprehension Comprehension Mode: Auditory Comprehension: 7-Follows complex conversation/direction: With no assist  Expression Expression Mode: Verbal Expression: 7-Expresses complex ideas: With no assist  Social Interaction Social Interaction: 7-Interacts appropriately with others - No medications needed.  Problem Solving Problem Solving: 7-Solves complex problems: Recognizes & self-corrects  Memory Memory: 7-Complete Independence: No helper  Medical Problem List and Plan:  1. Recent back surgery status post spinal fusion at [facility] 03/26/2012 complicated by DVT/pulmonary emboli/deconditioning  2. DVT Prophylaxis/Anticoagulation: Subcutaneous Lovenox and plan to transition to Coumadin as  outpatient.  Monitor for a bleeding episodes. IVC filter also in place 05/23/2012   -TEDS, elevation of legs to help control edema  -determine timing of plan before dc 3. Pain Management: Vicodin as needed. Monitor with increased mobility  4. Neuropsych: This patient is capable of making decisions on his/her own behalf.  5. Anemia/hematochezia. Followup CBC. Gastroenterology workup with findings of deep anal fissure and EGD colonoscopy unremarkable. Latest hemoglobin 8.3 after transfusion 6. Positive lupus anticoagulant. Followup per hematology services   7. Hypokalemia: improving.    -no more lasix  -one more lab before dc today LOS (Days) 4 A FACE TO FACE EVALUATION WAS PERFORMED  SWARTZ,ZACHARY T 05/31/2012, 7:38 AM

## 2012-05-31 NOTE — Progress Notes (Signed)
Social Work Patient ID: [name] [name], female   DOB: 1956-06-22, 56 y.o.   MRN: [id number]  Met briefly with this patient prior to d/c - no formal assessment completed as this SW absent on Friday due to illness.  Informed patient to d/c home this morning with husband to provide any assistance needed.  No therapy follow up recommended - plan for pt to follow up with her surgeon ASAP and will receive guidance on f/u therapy needs from his recommendations.  No DME needs.  Pt at modified independent level upon d/c.  No formal SW/CM services rendered.  [name] [name]

## 2012-05-31 NOTE — Progress Notes (Signed)
Patient discharge to home with spouse at 1010.  Discharge instructions provided by Harvel Ricks, PA.  Patient/spouse verbalized understanding and no further questions asked.  Inpatient rehab's NT escorted the patient off the unit with belonging in cart.

## 2012-06-02 ENCOUNTER — Telehealth: Payer: Self-pay | Admitting: Hematology and Oncology

## 2012-06-02 NOTE — Telephone Encounter (Signed)
S/W pt in re [place] fF/U appt 10/17 @ 10:30 w/Dr. Dalene Carrow.  NP packet will given at registration.

## 2012-06-02 NOTE — Telephone Encounter (Signed)
C/D 06/02/12 for appt. 06/03/12 °

## 2012-06-03 ENCOUNTER — Ambulatory Visit (HOSPITAL_BASED_OUTPATIENT_CLINIC_OR_DEPARTMENT_OTHER): Payer: BC Managed Care – PPO | Admitting: Hematology and Oncology

## 2012-06-03 ENCOUNTER — Other Ambulatory Visit: Payer: Self-pay | Admitting: Hematology and Oncology

## 2012-06-03 ENCOUNTER — Telehealth: Payer: Self-pay | Admitting: Hematology and Oncology

## 2012-06-03 ENCOUNTER — Encounter: Payer: Self-pay | Admitting: Hematology and Oncology

## 2012-06-03 ENCOUNTER — Ambulatory Visit: Payer: BC Managed Care – PPO

## 2012-06-03 VITALS — BP 146/81 | HR 107 | Temp 97.0°F | Resp 20 | Ht 65.0 in | Wt 147.6 lb

## 2012-06-03 DIAGNOSIS — I2699 Other pulmonary embolism without acute cor pulmonale: Secondary | ICD-10-CM

## 2012-06-03 DIAGNOSIS — I8290 Acute embolism and thrombosis of unspecified vein: Secondary | ICD-10-CM

## 2012-06-03 DIAGNOSIS — I82409 Acute embolism and thrombosis of unspecified deep veins of unspecified lower extremity: Secondary | ICD-10-CM

## 2012-06-03 DIAGNOSIS — I82220 Acute embolism and thrombosis of inferior vena cava: Secondary | ICD-10-CM

## 2012-06-03 DIAGNOSIS — D6859 Other primary thrombophilia: Secondary | ICD-10-CM

## 2012-06-03 NOTE — Patient Instructions (Signed)
[name] [name]  [id number]   [facility] - AFTER VISIT SUMMARY   **RECOMMENDATIONS MADE BY THE CONSULTANT AND ANY TEST    RESULTS WILL BE SENT TO YOUR REFERRING DOCTORS.   YOUR EXAM FINDINGS, LABS AND RESULTS WERE DISCUSSED BY YOUR MD TODAY.  YOU CAN GO TO THE [facility] WEB SITE FOR INSTRUCTIONS ON HOW TO ASSESS MY CHART FOR ADDITIONAL INFORMATION AS NEEDED.  Your Updated drug allergies are: Allergies as of 06/03/2012 - Review Complete 06/03/2012  Allergen Reaction Noted  . Prednisone Other (See Comments) 04/20/2012  . Tramadol Other (See Comments) 04/20/2012    Your current list of medications are: Current Outpatient Prescriptions  Medication Sig Dispense Refill  . enoxaparin (LOVENOX) 150 MG/ML injection Inject 0.53 mLs (80 mg total) into the skin every 12 (twelve) hours.  1 Syringe  1  . HYDROcodone-acetaminophen (NORCO/VICODIN) 5-325 MG per tablet Take 1 tablet by mouth every 4 (four) hours as needed.      Marland Kitchen lisinopril (PRINIVIL,ZESTRIL) 10 MG tablet Take 1 tablet (10 mg total) by mouth daily.  30 tablet  1  . pantoprazole (PROTONIX) 40 MG tablet Take 1 tablet (40 mg total) by mouth 2 (two) times daily before a meal.  60 tablet  1  . sennosides-docusate sodium (SENOKOT-S) 8.6-50 MG tablet Take 2 tablets by mouth every evening.         INSTRUCTIONS GIVEN AND DISCUSSED:  See attached schedule   SPECIAL INSTRUCTIONS/FOLLOW-UP:  See above.  I acknowledge that I have been informed and understand all the instructions given to me and received a copy.I know to contact the clinic, my physician, or go to the emergency Department if any problems should occur.   I do not have any more questions at this time, but understand that I may call the [facility] at [phone] during business hours should I have any further questions or need assistance in obtaining follow-up care.

## 2012-06-03 NOTE — Progress Notes (Signed)
Dr.   Rudi Heap     -     Primary  In  [place], [place] Dr.   Chip Boer     -     GI Dr.   Sarita Haver      -    Radiologist.  [facility]  In   [place].  Cell   Phone     [phone].

## 2012-06-03 NOTE — Progress Notes (Signed)
This office note has been dictated.

## 2012-06-03 NOTE — Telephone Encounter (Signed)
The appt re. was1/21/14 at 11:30/someone already in slot.  Pt hoping to ret. To wrk and with that she will be off on thurs.  sch appt and printed for pt

## 2012-06-03 NOTE — Progress Notes (Signed)
Checked in new pt with no financial concerns. °

## 2012-06-04 NOTE — Progress Notes (Signed)
CC:   Ernestina Penna, M.D. Bartholomew Crews, M.D. Tommie Raymond, M.D.  IDENTIFYING STATEMENT:  The patient is a 56 year old woman who was seen as an inpatient with extensive thrombosis and presents for outpatient followup.  INTERVAL HISTORY:  In summary, the patient had undergone a low spinal fusion surgery at [facility] on 04/05/2012.  Two weeks after the procedure she had presented with left lower leg swelling.  An ultrasound had shown deep vein thrombosis.  A CT angiogram on 04/20/2012 was positive for acute pulmonary emboli bilaterally.  She was placed on Lovenox and subsequently transitioned to Coumadin.  She was discharged home on 04/23/2012.  She had presented with right lower leg swelling despite a  therapeutic INR associated with malaise.  Her white cell count was elevated at  21,000. Dopplers had noted right deep vein thrombosis.  IVC filter was placed. She received heparin, thrombolytic therapy, and angioplasty with lysis and  mechanical thrombectomy to noth lower extremities. GI evaluation with an EGD and colonoscopy was unremarkable except for internal  and external hemorrhoids and an anal fissure was felt to be the source of  hematochezia.  Hypercoagulable workup was positive for lupus anticoagulant.  As a result of patient's extensive thrombosis and positive lupus anticoagulant, she was placed lovenox.   The patient is now home.  She feels well. She is tolerating Lovenox injections  with very minimal bruising.  Has decrease in her bilateral lower extremity  swelling.  She denies chest pain.  MEDICATIONS:  Reviewed and updated.  ALLERGIES:  Prednisone and tramadol.  PHYSICAL EXAM:  Alert and oriented x3.  Vitals:  Pulse 107, blood pressure 146/81, temperature 97, respirations 20, weight 147 pounds. Sclerae anicteric.  Mouth moist.  Chest CVS unremarkable.  Abdomen: Soft, nontender.  Bowel sounds present.  Extremities:+++ edema.   Pulses present  symmetrical.  IMPRESSION AND PLAN:  Mrs. [name] [name] is a 56 year old woman with extensive vein thrombosis with bilateral pulmonary emboli, bilateral lower extremity deep vein thrombosis and IVC thrombosis.  Status post IVC filter.  Status post transcatheter bilateral venous thrombolysis with angioplasty and stent placement.  The patient was lupus anticoagulant positive.  She will continue with Lovenox at 1 mg/kg twice daily for the time being.  She will be seeing Dr. Fredia Sorrow early next month.  She also plans to follow up with neurosurgery at [facility]. If she continues to do well, we will consider switching to Coumadin and aim for  INR between 3 and 3.5, or even consider Xarelto. She follows up in 4 months time.    ______________________________ Laurice Record, M.D. LIO/MEDQ  D:  06/03/2012  T:  06/04/2012  Job:  [id number]

## 2012-06-08 ENCOUNTER — Telehealth: Payer: Self-pay | Admitting: *Deleted

## 2012-06-08 NOTE — Telephone Encounter (Signed)
Received message from pt wanting to talk to nurse.   Called pt back and was informed re: pt saw her primary Dr. Thersa Salt today, and was given 2 rxs.  Pt wanted to check with Dr. Dalene Carrow to make sure these meds not interfere with Lovenox. Pt was given  Methocarbamol for back spasm and pain ,  And  Cephalexin  For the raised red knots from Lovenox injection.   RN consulted with Misty Stanley, pharmacist.    Called pt back and informed pt that it is ok for pt to take the new meds given by her primary as per pharmacist    Pt voiced understanding.

## 2012-06-16 ENCOUNTER — Telehealth: Payer: Self-pay | Admitting: Nurse Practitioner

## 2012-06-16 NOTE — Telephone Encounter (Signed)
Pt called and stated she is down to 5 Lovenox shots.  Stated she was instructed to call so that she could be changed to 1 shot per day. Currently is taking 2 per day.  Information to Dr. Dalene Carrow for review.

## 2012-06-17 ENCOUNTER — Telehealth: Payer: Self-pay | Admitting: Nurse Practitioner

## 2012-06-17 NOTE — Telephone Encounter (Signed)
Spoke with patient.  Informed that Dr. Dalene Carrow would like her primary care to assume anticoagulation treatment.  Spoke with [name] Merles, PA at Dr. Kathi Der office.  [name] [name] will call in prescription for transition to 1.5mg /kg daily Lovenox for patient.  [name] [name] inquired about INR parameters.  Informed per Dr. Lonell Face note- goal is between 3 and 3.5.  [name] [name] requested Dr. Dalene Carrow provide parameters for time to transition to coumadin or Xarelto.  Informed [name] [name] that Dr. Dalene Carrow is out of office, however someone from this office will call on Tuesday with requested information.

## 2012-06-18 ENCOUNTER — Telehealth: Payer: Self-pay | Admitting: Nurse Practitioner

## 2012-06-18 NOTE — Telephone Encounter (Signed)
Message copied by Barbara Cower on Fri Jun 18, 2012  3:06 PM ------      Message from: Arlan Organ I      Created: Fri Jun 18, 2012 12:44 PM       Call Dr Kathi Der office - Can transition to coumadin.  She should read my last note as it will answer question as to INR.      Xarelto is also fine if pt can afford.            LO

## 2012-06-18 NOTE — Telephone Encounter (Signed)
Spoke with CJ- informed of Dr. Lonell Face response that pt can transition to coumadin or Xarelto.  CJ asked for more concrete recommendation from Dr. Dalene Carrow regarding the amount of time for transition from Lovenox 1.5mg  daily.  CJ states she spoke with patient and patient was under the impression she should be on Lovenox until January.  CJ would like Dr. Lonell Face opinion on when is appropriate to transition to coumadin or Xarelto d/t pt is high risk for DVT.    Informed CJ- will send request to Dr. Dalene Carrow.

## 2012-06-22 ENCOUNTER — Ambulatory Visit
Admission: RE | Admit: 2012-06-22 | Discharge: 2012-06-22 | Disposition: A | Discharge status: Home / Self Care | Payer: BC Managed Care – PPO | Source: Ambulatory Visit | Attending: Physician Assistant | Admitting: Physician Assistant

## 2012-06-22 ENCOUNTER — Other Ambulatory Visit: Payer: Self-pay | Admitting: Hematology and Oncology

## 2012-06-22 VITALS — BP 151/76 | HR 122 | Temp 98.3°F | Resp 16

## 2012-06-22 DIAGNOSIS — I82409 Acute embolism and thrombosis of unspecified deep veins of unspecified lower extremity: Secondary | ICD-10-CM

## 2012-06-22 DIAGNOSIS — I2699 Other pulmonary embolism without acute cor pulmonale: Secondary | ICD-10-CM

## 2012-06-23 NOTE — Progress Notes (Signed)
Patient ID: [name] [name], female   DOB: November 01, 1955, 56 y.o.   MRN: [id number]  ESTABLISHED PATIENT OFFICE VISIT  Chief Complaint: Status post transcatheter thrombolytic therapy to treat bilateral lower extremity, iliofemoral and IVC thrombosis with IVC and iliac reconstruction.  History: [name] [name] returns for follow-up. She was admitted to the hospital on 05/18/2012 with bilateral lower extremity DVT, bilateral iliac vein thrombosis and IVC thrombosis extending up to the level of the renal veins. This was treated during hospitalization with placement of a suprarenal IVC filter followed by transcatheter thrombolytic therapy via bilateral popliteal venous access. A prolonged thrombolytic course was followed by completion reconstruction with placement of a lower IVC stent and extensive bilateral iliac vein stents. The patient was discharged on 05/27/2013 and also spent an additional 4 days in inpatient Rehab.  She is currently on twice a day Lovenox injections of 150 mg and has had a follow up visit with Dr. Dalene Carrow after discharge. She is awaiting to see if her insurance will cover Xarelto therapy as an outpatient. Symptoms of bilateral lower extremity edema and associated severely impaired ambulation have improved significantly since treatment. The patient has been using bilateral thigh high compression garments on a regular basis. She does notice some development of edema below the knee over the course of the day. She is able to ambulate independently. She does have some residual weakness in the lower extremities but no significant pain. She is experiencing some low back pain. The patient has continued smoking after discharge from the hospital and is smoking approximately half a pack of cigarettes a day.  Review of Systems: No fever or chills. No shortness of breath or hemoptysis. No abdominal pain.  Exam: Lower extremities: Both lower extremities demonstrate decrease in edema  since inpatient hospitalization. Mild pretibial edema is present bilaterally below the knees at the time of examination. There is no evidence of ulceration or discoloration. Nearly completely healed puncture sites are present in the popliteal regions bilaterally. Neck: A healed right neck incision is also present at the site of the jugular access for filter placement.  Assessment and Plan: [name] [name] shows significant clinical improvement after recent admission and therapy as above. Lower extremity edema has improved substantially and the patient is now able to ambulate without difficulty. Given the substantial clinical improvement , I did not feel it was necessary to perform bilateral lower extremity duplex ultrasound today, as the patient will need to remain on some form of anticoagulation long-term. I have recommended that she continue to wear her compression garments, especially when she anticipates standing for any prolonged periods of time. At the time of completion on 05/24/2013, IVC venography did demonstrate a small amount of captured thrombus at the apex of the indwelling suprarenal IVC filter. For this reason, the IVC filter was not immediately removed. I have recommended elective venography and possible IVC filter removal in roughly 2 weeks. I did encourage the patient to try to quit smoking. The patient has a follow up appointment this Friday with her neurosurgeon, Dr. Burtis Junes, at [facility].

## 2012-06-23 NOTE — Progress Notes (Signed)
PT IS STILL SMOKING 1/2 PPD, TAKING LOVENOX DAILY, NO PROBLEMS BREATHING, HAS COMPRESSION STOCKING AND STILL WEARING WHEN NEEDED.  BLE WEAKNESS AND GETS TIRED EASILY.  LBP.

## 2012-06-28 ENCOUNTER — Other Ambulatory Visit (HOSPITAL_COMMUNITY): Payer: Self-pay | Admitting: Interventional Radiology

## 2012-06-28 DIAGNOSIS — O2230 Deep phlebothrombosis in pregnancy, unspecified trimester: Secondary | ICD-10-CM

## 2012-07-01 ENCOUNTER — Other Ambulatory Visit: Payer: Self-pay | Admitting: Radiology

## 2012-07-04 ENCOUNTER — Other Ambulatory Visit: Payer: Self-pay | Admitting: Physician Assistant

## 2012-07-05 ENCOUNTER — Encounter (HOSPITAL_COMMUNITY): Payer: Self-pay | Admitting: Respiratory Therapy

## 2012-07-08 ENCOUNTER — Ambulatory Visit (HOSPITAL_COMMUNITY): Admission: RE | Admit: 2012-07-08 | Payer: BC Managed Care – PPO | Source: Ambulatory Visit

## 2012-07-16 ENCOUNTER — Encounter (HOSPITAL_COMMUNITY): Payer: Self-pay | Admitting: Respiratory Therapy

## 2012-07-19 ENCOUNTER — Other Ambulatory Visit: Payer: Self-pay | Admitting: Physician Assistant

## 2012-07-22 ENCOUNTER — Encounter (HOSPITAL_COMMUNITY): Payer: Self-pay

## 2012-07-22 ENCOUNTER — Ambulatory Visit (HOSPITAL_COMMUNITY)
Admission: RE | Admit: 2012-07-22 | Discharge: 2012-07-22 | Disposition: A | Discharge status: Home / Self Care | Payer: BC Managed Care – PPO | Source: Ambulatory Visit | Attending: Interventional Radiology | Admitting: Interventional Radiology

## 2012-07-22 DIAGNOSIS — O2230 Deep phlebothrombosis in pregnancy, unspecified trimester: Secondary | ICD-10-CM

## 2012-07-22 DIAGNOSIS — Z4509 Encounter for adjustment and management of other cardiac device: Secondary | ICD-10-CM | POA: Insufficient documentation

## 2012-07-22 DIAGNOSIS — I82220 Acute embolism and thrombosis of inferior vena cava: Secondary | ICD-10-CM | POA: Insufficient documentation

## 2012-07-22 DIAGNOSIS — F17200 Nicotine dependence, unspecified, uncomplicated: Secondary | ICD-10-CM | POA: Insufficient documentation

## 2012-07-22 DIAGNOSIS — I82409 Acute embolism and thrombosis of unspecified deep veins of unspecified lower extremity: Secondary | ICD-10-CM | POA: Insufficient documentation

## 2012-07-22 LAB — CBC
HCT: 42.3 % (ref 36.0–46.0)
Hemoglobin: 14.2 g/dL (ref 12.0–15.0)
MCH: 29.8 pg (ref 26.0–34.0)
MCHC: 33.6 g/dL (ref 30.0–36.0)
MCV: 88.7 fL (ref 78.0–100.0)
Platelets: 534 10*3/uL — ABNORMAL HIGH (ref 150–400)
RBC: 4.77 MIL/uL (ref 3.87–5.11)
RDW: 13.9 % (ref 11.5–15.5)
WBC: 10.6 10*3/uL — ABNORMAL HIGH (ref 4.0–10.5)

## 2012-07-22 LAB — BASIC METABOLIC PANEL
BUN: 5 mg/dL — ABNORMAL LOW (ref 6–23)
CO2: 26 mEq/L (ref 19–32)
Calcium: 9.7 mg/dL (ref 8.4–10.5)
Chloride: 100 mEq/L (ref 96–112)
Creatinine, Ser: 0.43 mg/dL — ABNORMAL LOW (ref 0.50–1.10)
GFR calc Af Amer: >90 mL/min (ref >90)
GFR calc non Af Amer: >90 mL/min (ref >90)
Glucose, Bld: 92 mg/dL (ref 70–99)
Potassium: 2.8 mEq/L — ABNORMAL LOW (ref 3.5–5.1)
Sodium: 141 mEq/L (ref 135–145)

## 2012-07-22 LAB — APTT: aPTT: 28 seconds (ref 24–37)

## 2012-07-22 LAB — PROTIME-INR
INR: 1.38 (ref 0.00–1.49)
Prothrombin Time: 16.6 seconds — ABNORMAL HIGH (ref 11.6–15.2)

## 2012-07-22 MED ORDER — FENTANYL CITRATE 0.05 MG/ML IJ SOLN
INTRAMUSCULAR | Status: AC
  Filled 2012-07-22: qty 4

## 2012-07-22 MED ORDER — SODIUM CHLORIDE 0.45 % IV SOLN
INTRAVENOUS | Status: DC
  Administered 2012-07-22: 09:00:00 via INTRAVENOUS

## 2012-07-22 MED ORDER — FENTANYL CITRATE 0.05 MG/ML IJ SOLN
INTRAMUSCULAR | Status: AC | PRN
  Administered 2012-07-22 (×2): 50 ug via INTRAVENOUS
  Administered 2012-07-22 (×2): 25 ug via INTRAVENOUS

## 2012-07-22 MED ORDER — IOHEXOL 300 MG/ML  SOLN
100.0000 mL | Freq: Once | INTRAMUSCULAR | Status: AC | PRN
  Administered 2012-07-22: 70 mL via INTRAVENOUS

## 2012-07-22 MED ORDER — MIDAZOLAM HCL 2 MG/2ML IJ SOLN
INTRAMUSCULAR | Status: AC
  Filled 2012-07-22: qty 4

## 2012-07-22 MED ORDER — HYDROCODONE-ACETAMINOPHEN 5-325 MG PO TABS: 1.0000 | ORAL_TABLET | ORAL | Status: DC | PRN

## 2012-07-22 MED ORDER — MIDAZOLAM HCL 2 MG/2ML IJ SOLN
INTRAMUSCULAR | Status: AC | PRN
  Administered 2012-07-22: 0.5 mg via INTRAVENOUS
  Administered 2012-07-22: 1 mg via INTRAVENOUS
  Administered 2012-07-22: 0.5 mg via INTRAVENOUS

## 2012-07-22 NOTE — H&P (Signed)
[name] [name] is an 56 y.o. female.   Chief Complaint: Bilateral lower extremity; iliofemoral and Inferior Vena Cava thrombus: lysis with angioplasty/stent placement 05/21/2012 Pt has done well with treatment Consulted with Dr Fredia Sorrow 06/23/12 Scheduled now for lower extremity venography with possible removal of Inferior Vena Cava filter HPI: Hx dvt; PE; back surgery  Past Medical History  Diagnosis Date  . DVT (deep venous thrombosis)   . Pulmonary embolism   . Back pain     Past Surgical History  Procedure Date  . Back surgery   . Colonoscopy 05/19/2012    Procedure: COLONOSCOPY;  Surgeon: Theda Belfast, MD;  Location: [facility] ENDOSCOPY;  Service: Endoscopy;  Laterality: N/A;  . Esophagogastroduodenoscopy 05/19/2012    Procedure: ESOPHAGOGASTRODUODENOSCOPY (EGD);  Surgeon: Theda Belfast, MD;  Location: [facility] ENDOSCOPY;  Service: Endoscopy;  Laterality: N/A;    Family History  Problem Relation Age of Onset  . Leukemia Mother   . Prostate cancer Father    Social History:  reports that she has been smoking Cigarettes.  She has a 14.5 pack-year smoking history. She does not have any smokeless tobacco history on file. She reports that she drinks alcohol. She reports that she does not use illicit drugs.  Allergies:  Allergies  Allergen Reactions  . Prednisone Other (See Comments)    No energy "stalls out".    Makes pt  Jittery.  . Tramadol Other (See Comments)    Constipation,  Makes pt  Jittery.     (Not in a hospital admission)  Results for orders placed during the hospital encounter of 07/22/12 (from the past 48 hour(s))  APTT     Status: Normal   Collection Time   07/22/12  9:19 AM      Component Value Range Comment   aPTT 28  24 - 37 seconds   CBC     Status: Abnormal   Collection Time   07/22/12  9:19 AM      Component Value Range Comment   WBC 10.6 (*) 4.0 - 10.5 K/uL    RBC 4.77  3.87 - 5.11 MIL/uL    Hemoglobin 14.2  12.0 - 15.0 g/dL    HCT 16.1  09.6 - 04.5 %    MCV  88.7  78.0 - 100.0 fL    MCH 29.8  26.0 - 34.0 pg    MCHC 33.6  30.0 - 36.0 g/dL    RDW 40.9  81.1 - 91.4 %    Platelets 534 (*) 150 - 400 K/uL   PROTIME-INR     Status: Abnormal   Collection Time   07/22/12  9:19 AM      Component Value Range Comment   Prothrombin Time 16.6 (*) 11.6 - 15.2 seconds    INR 1.38  0.00 - 1.49    No results found.  Review of Systems  Constitutional: Negative for fever.  Respiratory: Negative for cough and shortness of breath.   Cardiovascular: Positive for leg swelling. Negative for chest pain.       Still has slight swelling bilaterally   Gastrointestinal: Negative for nausea and vomiting.  Neurological: Positive for weakness.  Psychiatric/Behavioral: The patient is nervous/anxious.     Blood pressure 165/88, pulse 98, temperature 98.3 F (36.8 C), temperature source Oral, resp. rate 16, height 5' 4.5" (1.638 m), weight 130 lb (58.968 kg), SpO2 100.00%. Physical Exam  Constitutional: She is oriented to person, place, and time. She appears well-developed and well-nourished.  Cardiovascular: Normal rate, regular rhythm  and normal heart sounds.   No murmur heard. Respiratory: Effort normal and breath sounds normal. She has no wheezes.  GI: Soft. Bowel sounds are normal. There is no tenderness.  Musculoskeletal: Normal range of motion.  Neurological: She is alert and oriented to person, place, and time.  Skin: Skin is warm and dry. No erythema.  Psychiatric: She has a normal mood and affect. Her behavior is normal. Judgment and thought content normal.     Assessment/Plan Hx extensive bilat lower extr thrombus Thrombolysis with pta/stent placed 05/21/12 Sxs resolved after treatment and scheduled today for venography with poss IVC filter removal Pt aware of procedure benefits and risks and agreeable to proceed Consent signed and in chart  Tehya Leath A 07/22/2012, 10:28 AM

## 2012-07-22 NOTE — ED Notes (Signed)
Report given Short stay RN. Awaiting transporter. Taking po fluids.

## 2012-07-22 NOTE — ED Notes (Signed)
Husband updated, patient doing well and awaiting short stay bed

## 2012-07-22 NOTE — Procedures (Signed)
Procedure:  IVC filter retrieval Access:  Right IJ vein Findings:  IVC venogram through 5 Fr catheter shows normal patency without thrombus.  IVC filter successfully retrieved and removed.  Post retrieval venogram unremarkable.

## 2012-07-22 NOTE — ED Notes (Signed)
IV assessment:  22 g to left hand.  Dressing clean, dry and intact.  Site without redness, warmth, edema or discomfort.  Flushes with ease.

## 2012-07-22 NOTE — H&P (Signed)
Agree 

## 2012-07-23 ENCOUNTER — Telehealth (HOSPITAL_COMMUNITY): Payer: Self-pay | Admitting: *Deleted

## 2012-07-29 ENCOUNTER — Other Ambulatory Visit: Payer: Self-pay | Admitting: Physical Medicine & Rehabilitation

## 2012-08-07 ENCOUNTER — Telehealth: Payer: Self-pay | Admitting: Oncology

## 2012-08-07 NOTE — Telephone Encounter (Signed)
l/m to call and r/s dr lo appt(appt r/s to jh/dm)    anne

## 2012-08-21 ENCOUNTER — Encounter: Payer: Self-pay | Admitting: *Deleted

## 2012-09-09 ENCOUNTER — Ambulatory Visit (HOSPITAL_BASED_OUTPATIENT_CLINIC_OR_DEPARTMENT_OTHER): Payer: BC Managed Care – PPO | Admitting: Family

## 2012-09-09 ENCOUNTER — Telehealth: Payer: Self-pay | Admitting: Medical Oncology

## 2012-09-09 ENCOUNTER — Encounter: Payer: Self-pay | Admitting: Family

## 2012-09-09 ENCOUNTER — Telehealth: Payer: Self-pay | Admitting: Oncology

## 2012-09-09 ENCOUNTER — Ambulatory Visit: Payer: BC Managed Care – PPO | Admitting: Lab

## 2012-09-09 ENCOUNTER — Ambulatory Visit: Payer: BC Managed Care – PPO | Admitting: Hematology and Oncology

## 2012-09-09 VITALS — BP 186/96 | HR 103 | Temp 97.4°F | Resp 20 | Ht 64.5 in | Wt 139.0 lb

## 2012-09-09 DIAGNOSIS — I82409 Acute embolism and thrombosis of unspecified deep veins of unspecified lower extremity: Secondary | ICD-10-CM

## 2012-09-09 DIAGNOSIS — Z86711 Personal history of pulmonary embolism: Secondary | ICD-10-CM

## 2012-09-09 DIAGNOSIS — I2699 Other pulmonary embolism without acute cor pulmonale: Secondary | ICD-10-CM

## 2012-09-09 DIAGNOSIS — Z86718 Personal history of other venous thrombosis and embolism: Secondary | ICD-10-CM

## 2012-09-09 DIAGNOSIS — D6859 Other primary thrombophilia: Secondary | ICD-10-CM

## 2012-09-09 DIAGNOSIS — R894 Abnormal immunological findings in specimens from other organs, systems and tissues: Secondary | ICD-10-CM

## 2012-09-09 NOTE — Patient Instructions (Signed)
Please contact us at [phone] if you have any questions or concerns.  Please call us immediately if you are short of breath/trouble, unusually bleeding or bruising, have pain in your legs or chest

## 2012-09-09 NOTE — Telephone Encounter (Signed)
gv and printed appt schedule for pt for Jan and April...the patient aware

## 2012-09-09 NOTE — Progress Notes (Signed)
Patient ID: Neve Branscomb, female   DOB: Aug 30, 1955, 57 y.o.   MRN: [id number] CSN: [id number]  CC: Ernestina Penna, M.D.  Bartholomew Crews, M.D.  Tommie Raymond, M.D.    Problem List: Jelene Albano is a 57 y.o. Caucasian female with a problem list consisting of:  1.  Hypercoagulable state secondary to lupus anticoagulant.   Mrs. Clanton had a laminectomy at [facility] on 04/05/2012 with Dr. Burtis Junes. On 05/12/2012 a lower venous duplex was positive for a left lower extremity DVT.  A CT angiogram on  04/20/2012 was positive for bilateral pulmonary emboli.  She was started on a Lovenox bridge and then transitioned to Coumadin. She began having right lower for extremity pain/swelling and was found to have a right lower extremity DVT on 04/23/2012 despite having a therapeutic INR. A suprarenal  IVC filter was placed on 05/19/2012. On 05/24/2012 she was found to have and IVC thrombus and underwent a mechanical thrombectomy and bilateral lower extremity angioplasty with stent placement. She began taking Xarelto 05/2012. On 07/22/2012 the IVC filter was removed by Dr. Fredia Sorrow.   2.  Anterior anal fissure found on colonoscopy on 05/19/2012 3.  Tobacco abuse  4.  HTN  Dr. Arline Asp and I saw [name] today for follow up hypercoagulable state secondary to lupus anticoagulant. Mrs. Ziolkowski is transitioning her care from Dr. Dalene Carrow to Dr. Arline Asp today.  Mrs. Siegmann was last seen by Dr. Dalene Carrow on 06/03/2012. Since her last office visit, Mrs. Daidone states that she been doing well. She does report intermittent bilateral lower extremity swelling (foot areas), that spontaneously resolve.  Mrs. Kivett denies any other symptomatology during today's visit including any unusual bleeding, bruising, night sweats, fever, chills, headaches or constipation. Mrs. Daugherty is physically active and has a good appetite. 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  Mrs.  Severns continues to smoke 1/2 pack of cigarettes daily and declined smoking cessation assistance.  Mrs. Carreon is concerned about the price of her Xarelto increasing from $45 monthly to $245 monthly recently.  The [facility] Xarelto Pharmaceutical Sales Representative was contacted about this matter and he stated that once the patient meets the $245 deductible threshold, she should revert back to a $45 co-pay. Spoke to [place], CMA at Dr. Kathi Der office and informed her of the patient's elevated blood pressure at 186/96 today.   Past Medical History: Past Medical History  Diagnosis Date  . DVT (deep venous thrombosis)   . Pulmonary embolism   . Back pain     Surgical History: Past Surgical History  Procedure Date  . Back surgery   . Colonoscopy 05/19/2012    Procedure: COLONOSCOPY;  Surgeon: Theda Belfast, MD;  Location: [facility] ENDOSCOPY;  Service: Endoscopy;  Laterality: N/A;  . Esophagogastroduodenoscopy 05/19/2012    Procedure: ESOPHAGOGASTRODUODENOSCOPY (EGD);  Surgeon: Theda Belfast, MD;  Location: [facility] ENDOSCOPY;  Service: Endoscopy;  Laterality: N/A;    Current Medications: Current Outpatient Prescriptions  Medication Sig Dispense Refill  . acetaminophen (TYLENOL) 500 MG tablet Take 500 mg by mouth every 6 (six) hours as needed. For pain      . lisinopril (PRINIVIL,ZESTRIL) 10 MG tablet Take 1 tablet (10 mg total) by mouth daily.  30 tablet  1  . pantoprazole (PROTONIX) 40 MG tablet Take 40 mg by mouth daily.      . Rivaroxaban (XARELTO) 20 MG TABS Take 20 mg by mouth daily.  Allergies: Allergies  Allergen Reactions  . Prednisone Other (See Comments)    No energy "stalls out".    Makes pt  Jittery.  . Tramadol Other (See Comments)    Constipation,  Makes pt  Jittery.    Family History: Family History  Problem Relation Age of Onset  . Leukemia Mother   . Prostate cancer Father     Social History: History  Substance Use Topics  . Smoking status: Current Every Day  Smoker -- 0.5 packs/day for 29 years    Types: Cigarettes  . Smokeless tobacco: Never Used  . Alcohol Use: Yes     Comment: Occasional    Review of Systems: 10 Point review of systems was completed and is negative except as noted above.   Physical Exam:   Blood pressure 186/96, pulse 103, temperature 97.4 F (36.3 C), temperature source Oral, resp. rate 20, height 5' 4.5" (1.638 m), weight 139 lb (63.05 kg).  General appearance: Alert, cooperative, well nourished, no apparent distress Head: Normocephalic, without obvious abnormality, atraumatic Eyes: Conjunctivae/corneas clear, PERRLA, EOMI Nose: Nares, septum and mucosa are normal, no drainage or sinus tenderness Neck: No adenopathy, supple, symmetrical, trachea midline, thyroid not enlarged, no tenderness Resp: Clear to auscultation bilaterally Cardio: Regular rate and rhythm, S1, S2 normal, tachycardic, no murmur, click, rub or gallop GI: Soft, distended, non-tender, hypoactive bowel sounds, no organomegaly Extremities: Extremities normal, atraumatic, no cyanosis or edema Lymph nodes: Cervical, supraclavicular, and axillary nodes normal Neurologic: Grossly normal   Laboratory Data: No results found for this or any previous visit (from the past 48 hour(s)).   Procedures: 1.  EGD on 05/19/2012 showed a normal EGD. 2.  Colonoscopy on 05/19/2012 showed an anterior anal fissure. (The indication for colonoscopy was listed as hematochezia - Mrs. Gambino denies having hematochezia)  Imaging Studies: 1.  CT Angiography chest on 04/20/2012 showed positive for moderate acute pulmonary embolism bilaterally.  Mild atelectasis in both lungs.  No pleural effusion or confluent airspace opacity.  2.  US Venous duplex unilateral lower left on 05/12/2012 showed extensive deep vein thrombosis left lower extremity starting left  common femoral vein, femoral vein, popliteal vein down to the posterior tibial vein.  3.  Acute abdomen series with  chest on 05/18/2012 shows a nonspecific nonobstructive bowel gas pattern - no evidence of pneumoperitoneum. No evidence of active cardiopulmonary disease. 4.  CT of abdomen and pelvis on 05/19/2012 showed occlusive IVC thrombus extends from the left renal vein inferiorly into the bilateral common iliac, external iliac, and internal iliac veins. There is retroperitoneal and pelvic sidewall edema bilaterally with right greater than left proximal thigh edema. The edema is presumably secondary to the venous thrombosis. The patient is noted to have bilateral pedicle screws at L4 and L5 and the left L5 pedicle screw passes through the anterior L5 cortex with the tip of the screw this apparently involving the left common iliac vein.    Impression/Plan: Mrs. Krislynn Gronau is a 57 year-old Caucasian female with a hypercoagulable state secondary to lupus anticoagulant. She did not have laboratories drawn for today's office visit and did not have time to have laboratories drawn after today's office visit as she had another appointment to get to in [place], [place].  She has a laboratory appointment on 09/16/2012 to have a CBC, CMP, LDH, Lupus anticoagulant panel, Prothrombin gene mutation, Protein C and Protein S drawn.   We plan to see Mrs. Otterness in 3 months on 12/09/2012 at which time we will  check a CBC, CMP and LDH. Mrs. Etzler was encouraged to contact us in the interim if she has any questions or concerns.  Mrs. Doutt states that she will obtain the influenza vaccination from her PCP, Dr. Christell Constant in the near future.   Extensive records review was completed in preparation for the transitioning of Mrs. Peron's care to our service. Thirty minutes was spent face to face with this patient.    Larina Bras, NP-C 09/09/2012, 5:30 PM

## 2012-09-09 NOTE — Telephone Encounter (Signed)
I called pt and left her a message regarding her xarelto cost. Our rep Jeannene Patella called asking if pt has a pharmacy deductible that she has to meet. If so when she meets it her co-pay will go back to  $45.00. I asked her to call me to further discuss.

## 2012-09-10 ENCOUNTER — Other Ambulatory Visit: Payer: Self-pay | Admitting: Medical Oncology

## 2012-09-14 ENCOUNTER — Telehealth: Payer: Self-pay | Admitting: Medical Oncology

## 2012-09-14 NOTE — Telephone Encounter (Signed)
I called pt to let her know that I have a savings card for her. She has labs on the 30th and she can pick it up when she come in. She states that her pharmacist gave her a number to call last pm and she is not sure if this is the same thing. She states she was out of work for 3 months and they used up their savings paying bills. She states it is hard to come up with 250.00. I told her I understand and we will do what we can to help her.

## 2012-09-16 ENCOUNTER — Ambulatory Visit (HOSPITAL_BASED_OUTPATIENT_CLINIC_OR_DEPARTMENT_OTHER): Payer: BC Managed Care – PPO | Admitting: Lab

## 2012-09-16 ENCOUNTER — Telehealth: Payer: Self-pay

## 2012-09-16 DIAGNOSIS — I2699 Other pulmonary embolism without acute cor pulmonale: Secondary | ICD-10-CM

## 2012-09-16 DIAGNOSIS — I82409 Acute embolism and thrombosis of unspecified deep veins of unspecified lower extremity: Secondary | ICD-10-CM

## 2012-09-16 LAB — CBC WITH DIFFERENTIAL/PLATELET
BASO%: 0.4 % (ref 0.0–2.0)
Basophils Absolute: 0 10*3/uL (ref 0.0–0.1)
EOS%: 2.6 % (ref 0.0–7.0)
Eosinophils Absolute: 0.2 10*3/uL (ref 0.0–0.5)
HCT: 42.6 % (ref 34.8–46.6)
HGB: 14.9 g/dL (ref 11.6–15.9)
LYMPH%: 30.4 % (ref 14.0–49.7)
MCH: 30.7 pg (ref 25.1–34.0)
MCHC: 34.9 g/dL (ref 31.5–36.0)
MCV: 87.8 fL (ref 79.5–101.0)
MONO#: 0.5 10*3/uL (ref 0.1–0.9)
MONO%: 6 % (ref 0.0–14.0)
NEUT#: 4.8 10*3/uL (ref 1.5–6.5)
NEUT%: 60.6 % (ref 38.4–76.8)
Platelets: 423 10*3/uL — ABNORMAL HIGH (ref 145–400)
RBC: 4.85 10*6/uL (ref 3.70–5.45)
RDW: 12.2 % (ref 11.2–14.5)
WBC: 8 10*3/uL (ref 3.9–10.3)
lymph#: 2.4 10*3/uL (ref 0.9–3.3)

## 2012-09-16 LAB — COMPREHENSIVE METABOLIC PANEL (CC13)
ALT: 15 U/L (ref 0–55)
AST: 21 U/L (ref 5–34)
Albumin: 3.5 g/dL (ref 3.5–5.0)
Alkaline Phosphatase: 170 U/L — ABNORMAL HIGH (ref 40–150)
BUN: 6.5 mg/dL — ABNORMAL LOW (ref 7.0–26.0)
CO2: 25 mEq/L (ref 22–29)
Calcium: 9.8 mg/dL (ref 8.4–10.4)
Chloride: 105 mEq/L (ref 98–107)
Creatinine: 0.7 mg/dL (ref 0.6–1.1)
Glucose: 82 mg/dl (ref 70–99)
Potassium: 3.3 mEq/L — ABNORMAL LOW (ref 3.5–5.1)
Sodium: 142 mEq/L (ref 136–145)
Total Bilirubin: 0.36 mg/dL (ref 0.20–1.20)
Total Protein: 7.9 g/dL (ref 6.4–8.3)

## 2012-09-16 LAB — LACTATE DEHYDROGENASE (CC13): LDH: 198 U/L (ref 125–245)

## 2012-09-16 NOTE — Telephone Encounter (Signed)
lvm that K was 3.3, prior K was 2.8 on 07/22/12. I faxed labs to PCP Dr Ernestina Penna fax # [phone]

## 2012-09-18 ENCOUNTER — Encounter: Payer: Self-pay | Admitting: Oncology

## 2012-09-18 NOTE — Progress Notes (Signed)
Some of the results from the hypercoagulable panel dated 09/16/2012 are as follows:   Lupus anticoagulant was negative. Protein C activity  193 (H). Protein C total  115. Protein S  activity  140 (H). Protein S  total  143.

## 2012-09-20 ENCOUNTER — Telehealth: Payer: Self-pay | Admitting: Medical Oncology

## 2012-09-20 NOTE — Telephone Encounter (Signed)
Pt called and left a message requesting labs results. I attempted to call her at work but she was with a Financial trader. I also tried her cell but was busy. I mailed her a copy of the results to her home.

## 2012-09-21 ENCOUNTER — Telehealth: Payer: Self-pay

## 2012-09-21 LAB — PROTEIN C ACTIVITY: Protein C Activity: 193 % — ABNORMAL HIGH (ref 75–133)

## 2012-09-21 LAB — LUPUS ANTICOAGULANT PANEL
DRVVT 1:1 Mix: 40.5 secs (ref <42.9)
DRVVT: 45.3 secs — ABNORMAL HIGH (ref <42.9)
Lupus Anticoagulant: NOT DETECTED
PTT Lupus Anticoagulant: 35 secs (ref 28.0–43.0)

## 2012-09-21 LAB — PROTEIN S, TOTAL: Protein S Total: 143 % (ref 60–150)

## 2012-09-21 LAB — PROTEIN S ACTIVITY: Protein S Activity: 140 % — ABNORMAL HIGH (ref 69–129)

## 2012-09-21 LAB — PROTHROMBIN GENE MUTATION

## 2012-09-21 LAB — PROTEIN C, TOTAL: Protein C, Total: 115 % (ref 72–160)

## 2012-09-21 NOTE — Telephone Encounter (Signed)
Pt called earlier requesting labs. I s/w Dr Arline Asp. I let pt know that the labs are preliminary and incomplete but as of now Dr Arline Asp does not see anything concerning. Pt asked to be called with final and complete labs when available.

## 2012-10-08 ENCOUNTER — Encounter: Payer: Self-pay | Admitting: Family Medicine

## 2012-11-08 ENCOUNTER — Telehealth: Payer: Self-pay | Admitting: Nurse Practitioner

## 2012-11-08 ENCOUNTER — Telehealth: Payer: Self-pay | Admitting: Family Medicine

## 2012-11-08 NOTE — Telephone Encounter (Signed)
Has Question , Please call Back

## 2012-11-08 NOTE — Telephone Encounter (Signed)
Left message for patient to call back  

## 2012-11-08 NOTE — Telephone Encounter (Signed)
Just checking to see if I put a  Message in for this patient.

## 2012-11-08 NOTE — Telephone Encounter (Signed)
Returning call to [place]. Don't know why.

## 2012-11-09 ENCOUNTER — Telehealth: Payer: Self-pay | Admitting: Family Medicine

## 2012-11-09 ENCOUNTER — Other Ambulatory Visit: Payer: Self-pay | Admitting: Nurse Practitioner

## 2012-11-09 MED ORDER — RIVAROXABAN 20 MG PO TABS: 20.0000 mg | ORAL_TABLET | Freq: Every day | ORAL | Status: DC

## 2012-11-09 NOTE — Telephone Encounter (Signed)
xarelto needs to be called in runs out today [employer].

## 2012-11-09 NOTE — Telephone Encounter (Signed)
Rx sent to [facility]

## 2012-11-09 NOTE — Telephone Encounter (Signed)
Patient aware RX was sent.

## 2012-11-10 ENCOUNTER — Other Ambulatory Visit: Payer: Self-pay

## 2012-11-10 MED ORDER — RIVAROXABAN 20 MG PO TABS: 20.0000 mg | ORAL_TABLET | Freq: Every day | ORAL | Status: DC

## 2012-11-18 ENCOUNTER — Telehealth: Payer: Self-pay | Admitting: Oncology

## 2012-11-24 ENCOUNTER — Other Ambulatory Visit: Payer: Self-pay | Admitting: *Deleted

## 2012-11-24 MED ORDER — LISINOPRIL 20 MG PO TABS: ORAL_TABLET | ORAL | Status: DC

## 2012-12-01 ENCOUNTER — Telehealth: Payer: Self-pay | Admitting: Nurse Practitioner

## 2012-12-01 NOTE — Telephone Encounter (Signed)
Please advise 

## 2012-12-01 NOTE — Telephone Encounter (Signed)
Bring one in and i will be glad to sign

## 2012-12-02 NOTE — Telephone Encounter (Signed)
Form ready for pick up

## 2012-12-02 NOTE — Telephone Encounter (Signed)
Pt aware handiapp form up front

## 2012-12-02 NOTE — Telephone Encounter (Signed)
Form is on your desk to sign. 

## 2012-12-07 ENCOUNTER — Telehealth: Payer: Self-pay | Admitting: Nurse Practitioner

## 2012-12-07 NOTE — Telephone Encounter (Signed)
Pt aware.

## 2012-12-07 NOTE — Telephone Encounter (Signed)
Anything without a decongestant due to Blood pressure

## 2012-12-09 ENCOUNTER — Ambulatory Visit: Payer: BC Managed Care – PPO | Admitting: Oncology

## 2012-12-09 ENCOUNTER — Ambulatory Visit: Payer: BC Managed Care – PPO | Admitting: Physician Assistant

## 2012-12-09 ENCOUNTER — Other Ambulatory Visit: Payer: BC Managed Care – PPO | Admitting: Lab

## 2012-12-10 ENCOUNTER — Other Ambulatory Visit: Payer: Self-pay

## 2012-12-10 ENCOUNTER — Encounter: Payer: Self-pay | Admitting: Oncology

## 2012-12-10 DIAGNOSIS — I2699 Other pulmonary embolism without acute cor pulmonale: Secondary | ICD-10-CM

## 2012-12-10 NOTE — Progress Notes (Signed)
A hypercoagulable panel was carried out on 09/16/2012.  Some of those results can be found in my documentation note from 09/18/2012:   Lupus anticoagulant was negative.  Protein C activity 193 (H).  Protein C total 115.  Protein S activity 140 (H).  Protein S total 143.    Prothrombin II gene mutation was negative.   Leiden  factor V mutation was not carried out and will be ordered. In addition cardiolipin antibodies and glycoprotein 1 antibodies were not ordered.      The patient underwent a colonoscopy on 05/19/2012 by Dr. Jeani Hawking. The exam was negative except for an anterior anal fissure.

## 2012-12-13 ENCOUNTER — Telehealth: Payer: Self-pay | Admitting: Oncology

## 2012-12-14 ENCOUNTER — Other Ambulatory Visit: Payer: Self-pay

## 2012-12-15 ENCOUNTER — Telehealth: Payer: Self-pay | Admitting: *Deleted

## 2012-12-15 NOTE — Telephone Encounter (Signed)
sw pt she wanted to r/s her 6/2 appt. gv appt d/t for 02/17/13 lab at 1:30pm and ov at 2pm...td

## 2012-12-30 NOTE — Telephone Encounter (Signed)
Azalee Course CMA talked to patient 11/09/12

## 2013-01-13 ENCOUNTER — Other Ambulatory Visit: Payer: Self-pay

## 2013-01-13 MED ORDER — PANTOPRAZOLE SODIUM 40 MG PO TBEC: 40.0000 mg | DELAYED_RELEASE_TABLET | Freq: Every day | ORAL | Status: DC

## 2013-01-17 ENCOUNTER — Other Ambulatory Visit: Payer: BC Managed Care – PPO | Admitting: Lab

## 2013-01-17 ENCOUNTER — Ambulatory Visit: Payer: BC Managed Care – PPO | Admitting: Oncology

## 2013-02-07 ENCOUNTER — Other Ambulatory Visit (HOSPITAL_COMMUNITY): Payer: Self-pay | Admitting: Interventional Radiology

## 2013-02-07 DIAGNOSIS — M25473 Effusion, unspecified ankle: Secondary | ICD-10-CM

## 2013-02-07 DIAGNOSIS — I82403 Acute embolism and thrombosis of unspecified deep veins of lower extremity, bilateral: Secondary | ICD-10-CM

## 2013-02-08 ENCOUNTER — Other Ambulatory Visit (HOSPITAL_COMMUNITY): Payer: Self-pay | Admitting: Interventional Radiology

## 2013-02-08 DIAGNOSIS — I82403 Acute embolism and thrombosis of unspecified deep veins of lower extremity, bilateral: Secondary | ICD-10-CM

## 2013-02-08 DIAGNOSIS — M25473 Effusion, unspecified ankle: Secondary | ICD-10-CM

## 2013-02-17 ENCOUNTER — Ambulatory Visit: Payer: BC Managed Care – PPO | Admitting: Oncology

## 2013-02-17 ENCOUNTER — Telehealth: Payer: Self-pay | Admitting: Oncology

## 2013-02-17 ENCOUNTER — Other Ambulatory Visit (HOSPITAL_BASED_OUTPATIENT_CLINIC_OR_DEPARTMENT_OTHER): Payer: BC Managed Care – PPO | Admitting: Lab

## 2013-02-17 NOTE — Telephone Encounter (Signed)
Called pt re new appt for 7/24 @ 2:30 pm lb/CP. Pt came in at wrong time for appt today and was not able to stay. Pt also states she paid a copay and s/w someone who told her it was noted in the computer and would be applied to the next visit. Tiffany aware also. Schedule mailed.

## 2013-02-22 ENCOUNTER — Ambulatory Visit
Admission: RE | Admit: 2013-02-22 | Discharge: 2013-02-22 | Disposition: A | Discharge status: Home / Self Care | Payer: BC Managed Care – PPO | Source: Ambulatory Visit | Attending: Interventional Radiology | Admitting: Interventional Radiology

## 2013-02-22 DIAGNOSIS — M25473 Effusion, unspecified ankle: Secondary | ICD-10-CM

## 2013-02-22 DIAGNOSIS — I82403 Acute embolism and thrombosis of unspecified deep veins of lower extremity, bilateral: Secondary | ICD-10-CM

## 2013-02-22 NOTE — Progress Notes (Signed)
F/U BLE DVT- DR Fredia Sorrow ASSESSED PT IN [place] ROOM

## 2013-02-23 NOTE — Progress Notes (Signed)
Patient ID: [name] [name], female   DOB: [birth year], 57 y.o.   MRN: [id number]  ESTABLISHED PATIENT OFFICE VISIT  Chief Complaint: Lower extremity edema and status post prior transcatheter thrombolytic infusion therapy and stenting of the IVC and bilateral iliac veins for extensive IVC and bilateral iliofemoral DVT in October, 2013.  History: Mrs. Brzozowski asked to be seen today for evaluation of lower extremity edema. She remains on chronic Xarelto. She has noticed some lower extremity edema, primarily at the level of the ankles and feet, left greater than right, with associated discoloration of the skin. This develops over the course of the day after she has been working and standing and improves at night while she is sleeping. She does not have associated significant pain or difficulty ambulating. She denies any ulcerations or bleeding. 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  Review of Systems: No fever or chills. No shortness of breath or chest pain. No abdominal or pelvic pain. No nausea or vomiting.  Exam: Vital signs: Blood pressure 174/94, pulse 91, temperature 98.1, respirations 16, oxygen saturation 100% on room air. General: No acute distress. Extremities: Both lower extremities demonstrate mild lower leg edema, primarily in the lower calves, ankles and feet. Edema is slightly worse on the left. There is mild hyperpigmentation of the skin involving both feet. No ulcerations or erythema are identified. There are no palpable varicosities. Normal arterial pulses are palpated bilaterally.  Imaging: Deep venous evaluation by duplex ultrasound was performed bilaterally today and is separately dictated. This shows evidence of residual chronic nonocclusive thrombus in both femoral veins without findings to suggest acute thrombus or occlusive thrombus. The popliteal veins and tibial veins are open bilaterally.  Assessment and Plan: Chronic edema due to prior  extensive bilateral iliofemoral and IVC DVT. Ultrasound shows no evidence to suggest interval acute thrombus. I told the patient to try to elevate her lower extremities during the day, especially since she has a job that requires prolonged periods of standing. The patient also has compression stockings at home which I advised her to use as needed. She did ask about exercise today and I encouraged her to pursue regular walking and exercise which may help to decrease lower extremity edema.

## 2013-03-07 ENCOUNTER — Other Ambulatory Visit: Payer: Self-pay | Admitting: Nurse Practitioner

## 2013-03-09 ENCOUNTER — Other Ambulatory Visit: Payer: Self-pay | Admitting: Nurse Practitioner

## 2013-03-09 ENCOUNTER — Other Ambulatory Visit: Payer: Self-pay | Admitting: Medical Oncology

## 2013-03-09 DIAGNOSIS — D72829 Elevated white blood cell count, unspecified: Secondary | ICD-10-CM

## 2013-03-10 ENCOUNTER — Ambulatory Visit (HOSPITAL_BASED_OUTPATIENT_CLINIC_OR_DEPARTMENT_OTHER): Payer: BC Managed Care – PPO | Admitting: Hematology and Oncology

## 2013-03-10 ENCOUNTER — Other Ambulatory Visit (HOSPITAL_BASED_OUTPATIENT_CLINIC_OR_DEPARTMENT_OTHER): Payer: BC Managed Care – PPO | Admitting: Lab

## 2013-03-10 ENCOUNTER — Telehealth: Payer: Self-pay | Admitting: Hematology and Oncology

## 2013-03-10 DIAGNOSIS — D6859 Other primary thrombophilia: Secondary | ICD-10-CM

## 2013-03-10 DIAGNOSIS — I82409 Acute embolism and thrombosis of unspecified deep veins of unspecified lower extremity: Secondary | ICD-10-CM

## 2013-03-10 DIAGNOSIS — D72829 Elevated white blood cell count, unspecified: Secondary | ICD-10-CM

## 2013-03-10 LAB — COMPREHENSIVE METABOLIC PANEL (CC13)
ALT: 13 U/L (ref 0–55)
AST: 14 U/L (ref 5–34)
Albumin: 3.6 g/dL (ref 3.5–5.0)
Alkaline Phosphatase: 119 U/L (ref 40–150)
BUN: 5.3 mg/dL — ABNORMAL LOW (ref 7.0–26.0)
CO2: 25 mEq/L (ref 22–29)
Calcium: 9.3 mg/dL (ref 8.4–10.4)
Chloride: 106 mEq/L (ref 98–109)
Creatinine: 0.6 mg/dL (ref 0.6–1.1)
Glucose: 93 mg/dl (ref 70–140)
Potassium: 3.3 mEq/L — ABNORMAL LOW (ref 3.5–5.1)
Sodium: 143 mEq/L (ref 136–145)
Total Bilirubin: 0.29 mg/dL (ref 0.20–1.20)
Total Protein: 7 g/dL (ref 6.4–8.3)

## 2013-03-10 LAB — CBC WITH DIFFERENTIAL/PLATELET
BASO%: 0.6 % (ref 0.0–2.0)
Basophils Absolute: 0.1 10*3/uL (ref 0.0–0.1)
EOS%: 2.2 % (ref 0.0–7.0)
Eosinophils Absolute: 0.2 10*3/uL (ref 0.0–0.5)
HCT: 35.6 % (ref 34.8–46.6)
HGB: 12.3 g/dL (ref 11.6–15.9)
LYMPH%: 34.3 % (ref 14.0–49.7)
MCH: 30.6 pg (ref 25.1–34.0)
MCHC: 34.5 g/dL (ref 31.5–36.0)
MCV: 88.7 fL (ref 79.5–101.0)
MONO#: 0.6 10*3/uL (ref 0.1–0.9)
MONO%: 6.5 % (ref 0.0–14.0)
NEUT#: 5 10*3/uL (ref 1.5–6.5)
NEUT%: 56.4 % (ref 38.4–76.8)
Platelets: 389 10*3/uL (ref 145–400)
RBC: 4.01 10*6/uL (ref 3.70–5.45)
RDW: 12.3 % (ref 11.2–14.5)
WBC: 8.8 10*3/uL (ref 3.9–10.3)
lymph#: 3 10*3/uL (ref 0.9–3.3)

## 2013-03-10 LAB — LACTATE DEHYDROGENASE (CC13): LDH: 154 U/L (ref 125–245)

## 2013-03-10 NOTE — Telephone Encounter (Signed)
gv and printed appt sched and avs for pt  °

## 2013-03-10 NOTE — Progress Notes (Signed)
Patient ID: [name] [name], female   DOB: 11/29/1955, 57 y.o.   MRN: [id number] CSN: [id number]  CC: Ernestina Penna, M.D.  Bartholomew Crews, M.D.  Tommie Raymond, M.D.    Problem List: [name] [name] is a 57 y.o. Caucasian female with a problem list consisting of:  1.  Hypercoagulable state secondary to lupus anticoagulant.   [name] [name] had a laminectomy at [facility] on 04/05/2012 with Dr. Burtis Junes. On 05/12/2012 a lower venous duplex was positive for a left lower extremity DVT.  A CT angiogram on  04/20/2012 was positive for bilateral pulmonary emboli.  She was started on a Lovenox bridge and then transitioned to Coumadin. She began having right lower for extremity pain/swelling and was found to have a right lower extremity DVT on 04/23/2012 despite having a therapeutic INR. A suprarenal  IVC filter was placed on 05/19/2012. On 05/24/2012 she was found to have and IVC thrombus and underwent a mechanical thrombectomy and bilateral lower extremity angioplasty with stent placement. She began taking Xarelto 05/2012. On 07/22/2012 the IVC filter was removed by Dr. Fredia Sorrow.   2.  Anterior anal fissure found on colonoscopy on 05/19/2012 3.  Tobacco abuse  4.  HTN We saw  [name] [name] today for follow up hypercoagulable state secondary to lupus anticoagulant. [name] [name] states that she been doing well. She does report intermittent bilateral lower extremity swelling (foot areas), that spontaneously resolve.  [name] [name] denies any other symptomatology during today's visit including any unusual bleeding, bruising, night sweats, fever, chills, headaches or constipation. [name] [name] is physically active and has a good appetite. 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  [name] [name] continues to smoke 1/2 pack of cigarettes daily and declined smoking cessation assistance.   Past Medical History: Past Medical History  Diagnosis Date  . DVT (deep  venous thrombosis)   . Pulmonary embolism   . Back pain     Surgical History: Past Surgical History  Procedure Laterality Date  . Back surgery    . Colonoscopy  05/19/2012    Procedure: COLONOSCOPY;  Surgeon: Theda Belfast, MD;  Location: [facility] ENDOSCOPY;  Service: Endoscopy;  Laterality: N/A;  . Esophagogastroduodenoscopy  05/19/2012    Procedure: ESOPHAGOGASTRODUODENOSCOPY (EGD);  Surgeon: Theda Belfast, MD;  Location: [facility] ENDOSCOPY;  Service: Endoscopy;  Laterality: N/A;    Current Medications: Current Outpatient Prescriptions  Medication Sig Dispense Refill  . acetaminophen (TYLENOL) 500 MG tablet Take 500 mg by mouth every 6 (six) hours as needed. For pain      . lisinopril (PRINIVIL,ZESTRIL) 20 MG tablet 1 tablet qd  30 tablet  4  . pantoprazole (PROTONIX) 40 MG tablet Take 1 tablet (40 mg total) by mouth daily.  30 tablet  1  . XARELTO 20 MG TABS TAKE 1 TABLET BY MOUTH ONCE DAILY  30 tablet  3   No current facility-administered medications for this visit.    Allergies: Allergies  Allergen Reactions  . Prednisone Other (See Comments)    No energy "stalls out".    Makes pt  Jittery.  . Tramadol Other (See Comments)    Constipation,  Makes pt  Jittery.    Family History: Family History  Problem Relation Age of Onset  . Leukemia Mother   . Prostate cancer Father     Social History: History  Substance Use Topics  . Smoking status: Current Every Day Smoker -- 0.50 packs/day for 29 years    Types:  Cigarettes  . Smokeless tobacco: Never Used  . Alcohol Use: Yes     Comment: Occasional    Review of Systems: 10 Point review of systems was completed and is negative except as noted above.   Physical Exam:   Blood pressure 165/86, pulse 101, temperature 97.6 F (36.4 C), temperature source Oral, resp. rate 18, height 5' 4.5" (1.638 m), weight 152 lb 9.6 oz (69.219 kg).  General appearance: Alert, cooperative, well nourished, no apparent distress Head: Normocephalic,  without obvious abnormality, atraumatic Eyes: Conjunctivae/corneas clear, PERRLA, EOMI Nose: Nares, septum and mucosa are normal, no drainage or sinus tenderness Neck: No adenopathy, supple, symmetrical, trachea midline, thyroid not enlarged, no tenderness Resp: Clear to auscultation bilaterally Cardio: Regular rate and rhythm, S1, S2 normal, tachycardic, no murmur, click, rub or gallop GI: Soft, distended, non-tender, hypoactive bowel sounds, no organomegaly Extremities: Extremities normal, atraumatic, no cyanosis or edema Lymph nodes: Cervical, supraclavicular, and axillary nodes normal Neurologic: Grossly normal   Laboratory Data: Results for orders placed in visit on 03/10/13 (from the past 48 hour(s))  CBC WITH DIFFERENTIAL     Status: None   Collection Time    03/10/13  2:26 PM      Result Value Range   WBC 8.8  3.9 - 10.3 10e3/uL   NEUT# 5.0  1.5 - 6.5 10e3/uL   HGB 12.3  11.6 - 15.9 g/dL   HCT 14.7  82.9 - 56.2 %   Platelets 389  145 - 400 10e3/uL   MCV 88.7  79.5 - 101.0 fL   MCH 30.6  25.1 - 34.0 pg   MCHC 34.5  31.5 - 36.0 g/dL   RBC 1.30  8.65 - 7.84 10e6/uL   RDW 12.3  11.2 - 14.5 %   lymph# 3.0  0.9 - 3.3 10e3/uL   MONO# 0.6  0.1 - 0.9 10e3/uL   Eosinophils Absolute 0.2  0.0 - 0.5 10e3/uL   Basophils Absolute 0.1  0.0 - 0.1 10e3/uL   NEUT% 56.4  38.4 - 76.8 %   LYMPH% 34.3  14.0 - 49.7 %   MONO% 6.5  0.0 - 14.0 %   EOS% 2.2  0.0 - 7.0 %   BASO% 0.6  0.0 - 2.0 %  COMPREHENSIVE METABOLIC PANEL (CC13)     Status: Abnormal   Collection Time    03/10/13  2:26 PM      Result Value Range   Sodium 143  136 - 145 mEq/L   Potassium 3.3 (*) 3.5 - 5.1 mEq/L   Chloride 106  98 - 109 mEq/L   CO2 25  22 - 29 mEq/L   Glucose 93  70 - 140 mg/dl   BUN 5.3 (*) 7.0 - 69.6 mg/dL   Creatinine 0.6  0.6 - 1.1 mg/dL   Total Bilirubin 2.95  0.20 - 1.20 mg/dL   Alkaline Phosphatase 119  40 - 150 U/L   AST 14  5 - 34 U/L   ALT 13  0 - 55 U/L   Total Protein 7.0  6.4 - 8.3 g/dL    Albumin 3.6  3.5 - 5.0 g/dL   Calcium 9.3  8.4 - 28.4 mg/dL     Procedures: 1.  EGD on 05/19/2012 showed a normal EGD. 2.  Colonoscopy on 05/19/2012 showed an anterior anal fissure. (The indication for colonoscopy was listed as hematochezia - Mrs. Hargrove denies having hematochezia)  Imaging Studies: 1.  CT Angiography chest on 04/20/2012 showed positive for moderate acute pulmonary embolism  bilaterally.  Mild atelectasis in both lungs.  No pleural effusion or confluent airspace opacity.  2.  US Venous duplex unilateral lower left on 05/12/2012 showed extensive deep vein thrombosis left lower extremity starting left  common femoral vein, femoral vein, popliteal vein down to the posterior tibial vein.  3.  Acute abdomen series with chest on 05/18/2012 shows a nonspecific nonobstructive bowel gas pattern - no evidence of pneumoperitoneum. No evidence of active cardiopulmonary disease. 4.  CT of abdomen and pelvis on 05/19/2012 showed occlusive IVC thrombus extends from the left renal vein inferiorly into the bilateral common iliac, external iliac, and internal iliac veins. There is retroperitoneal and pelvic sidewall edema bilaterally with right greater than left proximal thigh edema. The edema is presumably secondary to the venous thrombosis. The patient is noted to have bilateral pedicle screws at L4 and L5 and the left L5 pedicle screw passes through the anterior L5 cortex with the tip of the screw this apparently involving the left common iliac vein.    Impression/Plan: Mrs. Meilah Delrosario is a 57 year-old Caucasian female with a hypercoagulable state secondary to lupus anticoagulant. She is doing well on Xarelto , no more clotting event and no bleeding episodes.   We plan to see Mrs. Pankonin in 3 months and at which time we will check a CBC, CMP and LDH. Mrs. Padron was encouraged to contact us in the interim if she has any questions or concerns.  Extensive records review was completed in  preparation for the transitioning of Mrs. Leckrone's care to our service. Thirty minutes was spent face to face with this patient.    Vlasta Baskin E,  03/10/2013, 3:10 PM

## 2013-03-21 ENCOUNTER — Other Ambulatory Visit: Payer: Self-pay | Admitting: Nurse Practitioner

## 2013-03-23 ENCOUNTER — Other Ambulatory Visit: Payer: Self-pay | Admitting: Nurse Practitioner

## 2013-04-20 ENCOUNTER — Other Ambulatory Visit: Payer: Self-pay | Admitting: Nurse Practitioner

## 2013-04-25 ENCOUNTER — Other Ambulatory Visit: Payer: Self-pay | Admitting: Nurse Practitioner

## 2013-04-27 ENCOUNTER — Other Ambulatory Visit: Payer: Self-pay | Admitting: Family Medicine

## 2013-04-27 ENCOUNTER — Other Ambulatory Visit: Payer: Self-pay | Admitting: Nurse Practitioner

## 2013-04-27 NOTE — Telephone Encounter (Signed)
Last seen 09/27/12  MMM 

## 2013-06-06 ENCOUNTER — Other Ambulatory Visit: Payer: Self-pay | Admitting: Nurse Practitioner

## 2013-06-07 NOTE — Telephone Encounter (Signed)
Last seen 10/07/12  MMM 

## 2013-06-09 ENCOUNTER — Telehealth: Payer: Self-pay | Admitting: *Deleted

## 2013-06-09 ENCOUNTER — Other Ambulatory Visit (HOSPITAL_BASED_OUTPATIENT_CLINIC_OR_DEPARTMENT_OTHER): Payer: BC Managed Care – PPO | Admitting: Lab

## 2013-06-09 ENCOUNTER — Other Ambulatory Visit: Payer: Self-pay | Admitting: Internal Medicine

## 2013-06-09 ENCOUNTER — Ambulatory Visit (HOSPITAL_BASED_OUTPATIENT_CLINIC_OR_DEPARTMENT_OTHER): Payer: BC Managed Care – PPO | Admitting: Internal Medicine

## 2013-06-09 VITALS — BP 164/83 | HR 104 | Temp 98.0°F | Resp 20 | Ht 64.5 in | Wt 153.5 lb

## 2013-06-09 DIAGNOSIS — D473 Essential (hemorrhagic) thrombocythemia: Secondary | ICD-10-CM

## 2013-06-09 DIAGNOSIS — D6859 Other primary thrombophilia: Secondary | ICD-10-CM

## 2013-06-09 DIAGNOSIS — I2699 Other pulmonary embolism without acute cor pulmonale: Secondary | ICD-10-CM

## 2013-06-09 DIAGNOSIS — F17200 Nicotine dependence, unspecified, uncomplicated: Secondary | ICD-10-CM

## 2013-06-09 DIAGNOSIS — I82409 Acute embolism and thrombosis of unspecified deep veins of unspecified lower extremity: Secondary | ICD-10-CM

## 2013-06-09 DIAGNOSIS — Z7901 Long term (current) use of anticoagulants: Secondary | ICD-10-CM

## 2013-06-09 DIAGNOSIS — D75839 Thrombocytosis, unspecified: Secondary | ICD-10-CM

## 2013-06-09 DIAGNOSIS — Z720 Tobacco use: Secondary | ICD-10-CM

## 2013-06-09 DIAGNOSIS — Z86718 Personal history of other venous thrombosis and embolism: Secondary | ICD-10-CM

## 2013-06-09 LAB — COMPREHENSIVE METABOLIC PANEL (CC13)
ALT: 6 U/L (ref 0–55)
AST: 12 U/L (ref 5–34)
Albumin: 3.8 g/dL (ref 3.5–5.0)
Alkaline Phosphatase: 119 U/L (ref 40–150)
Anion Gap: 12 mEq/L — ABNORMAL HIGH (ref 3–11)
BUN: 7.3 mg/dL (ref 7.0–26.0)
CO2: 23 mEq/L (ref 22–29)
Calcium: 10.1 mg/dL (ref 8.4–10.4)
Chloride: 105 mEq/L (ref 98–109)
Creatinine: 0.7 mg/dL (ref 0.6–1.1)
Glucose: 103 mg/dl (ref 70–140)
Potassium: 3.9 mEq/L (ref 3.5–5.1)
Sodium: 140 mEq/L (ref 136–145)
Total Bilirubin: 0.35 mg/dL (ref 0.20–1.20)
Total Protein: 7.9 g/dL (ref 6.4–8.3)

## 2013-06-09 LAB — CBC WITH DIFFERENTIAL/PLATELET
BASO%: 0.8 % (ref 0.0–2.0)
Basophils Absolute: 0.1 10*3/uL (ref 0.0–0.1)
EOS%: 0.9 % (ref 0.0–7.0)
Eosinophils Absolute: 0.1 10*3/uL (ref 0.0–0.5)
HCT: 38.7 % (ref 34.8–46.6)
HGB: 13.3 g/dL (ref 11.6–15.9)
LYMPH%: 25.8 % (ref 14.0–49.7)
MCH: 30.3 pg (ref 25.1–34.0)
MCHC: 34.4 g/dL (ref 31.5–36.0)
MCV: 88.2 fL (ref 79.5–101.0)
MONO#: 0.6 10*3/uL (ref 0.1–0.9)
MONO%: 5.3 % (ref 0.0–14.0)
NEUT#: 7.4 10*3/uL — ABNORMAL HIGH (ref 1.5–6.5)
NEUT%: 67.2 % (ref 38.4–76.8)
Platelets: 439 10*3/uL — ABNORMAL HIGH (ref 145–400)
RBC: 4.39 10*6/uL (ref 3.70–5.45)
RDW: 13 % (ref 11.2–14.5)
WBC: 11 10*3/uL — ABNORMAL HIGH (ref 3.9–10.3)
lymph#: 2.8 10*3/uL (ref 0.9–3.3)

## 2013-06-09 NOTE — Patient Instructions (Signed)
ANTIPHOSPHOLIPID SYNDROME OVERVIEW - Phospholipids are present in the membranes that form the surface of cells, including blood cells and endothelial cells that line blood vessels. In some people, the immune system develops antibodies to these phospholipids or to proteins that are attached to them. The presence of these antiphospholipid antibodies may increase the risk of developing blood clots in the veins or arteries and may also cause an increased risk of miscarriage or stillbirth among pregnant women. However, some people have these antibodies and do not develop clots or have miscarriages. People who have these antibodies and develop blood clots or pregnancy-related complications are said to have a syndrome called the antiphospholipid syndrome (APS). APS is an autoimmune disorder, meaning that it occurs when the body's immune system mistakenly attacks healthy tissues and organs. APS is more common in women and in patients with other autoimmune or rheumatic diseases, particularly systemic lupus erythematosus (SLE). APS is referred to as "primary" when it occurs alone and as "secondary" when it occurs in association with another disorder. (See "Patient information: Systemic lupus erythematosus (SLE) (Beyond the Basics)".)  ANTIPHOSPHOLIPID SYNDROME SYMPTOMS - The symptoms of antiphospholipid syndrome (APS) are related primarily to abnormal blood clotting, miscarriage, or stillbirth. Thrombosis (blood clot) - The presence of antiphospholipid antibodies can increase the risk of developing a thrombosis (blood clot) in a vein or artery. Without treatment, patients often experience repeated clots. Blood flow to and function of important organs can be affected, depending upon where the clot is located. Many organs are susceptible to injury from blood clots. A clot that forms in or blocks an artery can impair blood flow to the brain, causing problems ranging from brief, reversible neurologic symptoms to a stroke  that causes permanent brain damage. Impaired blood flow to the kidney can cause problems ranging from mild kidney dysfunction to kidney failure. Clots in large veins can lead to pain and swelling of the affected limb. This is referred to as a deep venous thrombosis (DVT). Legs are most often affected, but the arms can occasionally be involved. In addition to the pain and swelling that such blood clots cause, there is also a risk that a large clot will break free and travel through the heart to the blood vessels of the lungs, where it can block blood flow. A clot that travels is called an embolus; an embolus in the lung is called a pulmonary embolism. (See "Patient information: Deep vein thrombosis (DVT) (Beyond the Basics)".) If a large vessel in the lung is blocked due to a pulmonary embolus, a person may feel chest pain, difficulty breathing, and sometimes coughs up of blood. Very large clots can lead to fainting or even death. Thrombocytopenia - In some patients, APS leads to a decrease in the number of cells known as platelets. This condition is known as thrombocytopenia. Platelets are essential to the normal blood clotting process. When the number of platelets is significantly lowered (to less than 30,000), there is a risk of bleeding, particularly from the nose and gums, into the skin (called petechiae), from the digestive tract, and from the uterus in women. In patients with APS, however, the reduced number of platelets may be associated with an increased risk of blood clots rather than bleeding problems. Miscarriages - In women with APS, antiphospholipid antibodies can increase the risk of miscarriage or stillbirth. (See 'Antiphospholipid syndrome and pregnancy' below.) ANTIPHOSPHOLIPID SYNDROME DIAGNOSIS - A diagnosis of antiphospholipid syndrome (APS) is based upon a patient's history and laboratory findings. The diagnosis requires the  following: ?An episode of blood clots, one or more miscarriages  after the 10th week of pregnancy, three or more miscarriages prior to the 10th week of pregnancy, or one or more premature births prior to the 34th week of pregnancy due to eclampsia. ?Antiphospholipid antibodies detected with blood testing on at least two different occasions at least 12 weeks apart. Antiphospholipid antibody tests include the lupus anticoagulant, anti-cardiolipin antibodies, and anti-beta-2-glycoprotein I antibodies.  TREATMENT AND PREVENTION OF BLOOD CLOTS Anticoagulants - Anticoagulants are medications that prevent an existing blood clot from enlarging and that prevent the formation of additional blood clots. Anticoagulants are commonly, but incorrectly, referred to as blood thinners. Patients with antiphospholipid syndrome (APS) are often treated with an injectable anticoagulant called heparin or low molecular weight heparin (LMWH). In some cases, the heparin is given into a vein while the patient is in the hospital. In other cases, heparin is injected under the skin. There are several types of LMWH, including enoxaparin (Lovenox), dalteparin (Fragmin), or tinzaparin (Innohep). These can be injected beneath the skin by the patient, a family member, or a home health nurse. This is usually continued for several days, along with another medication called warfarin (Coumadin). Warfarin is an anticoagulant medication that is taken by mouth, as a pill. Heparin can be stopped once warfarin levels are stable. (See "Patient information: Warfarin (Coumadin) (Beyond the Basics)".) Monitoring of warfarin - Careful monitoring and periodic adjustments in the warfarin dose are typically needed to maintain a level that prevents clots but that does not cause significant bleeding. The blood test used to measure the effects of warfarin is called the prothrombin time (called pro time, or PT). The PT is a laboratory test that measures the time it takes for the clotting mechanism to progress. It is particularly  sensitive to the clotting factors affected by warfarin. The PT is used to compute a value known as the International Normalized Ratio (INR). The INR is a way of expressing the PT in a standardized way; this ensures that results obtained by different laboratories can be reliably compared. The longer it takes the blood to clot, the higher the PT and INR. The target INR range depends upon the clinical situation. In most cases, the target range is 2 to 3. If the INR is below the target range (ie, under-anticoagulated), there is a risk of clotting. If, on the other hand, the INR is above the target range (ie, over-anticoagulated), there is an increased risk of bleeding. Antiplatelet treatments - Aspirin is another medication sometimes recommended for people with APS. Aspirin inhibits the clumping of platelets; platelets are tiny cell fragments in the blood that have a role in blood clotting. Under normal circumstances, platelets clump together and help form blood clots to stop bleeding when needed. Aspirin is sometimes used in addition to warfarin. It is not usually used as a replacement for warfarin, especially in patients with a history of serious blood clots. Aspirin alone may be recommended for patients who have experienced a single, less severe blood clot. Treatment before and during surgery - Anticoagulant and antiplatelet treatments may need to be adjusted before, during, and after surgery or other procedures that have a risk of bleeding. The goal of treatment adjustments is to minimize the risk of new blood clots as well as the risk of excessive bleeding. This usually involves stopping warfarin several days before surgery. The patient may be given heparin before and after surgery, depending upon several factors (the patient's previous history of thrombosis, type  of surgery, etc). Warfarin can usually be restarted within 12 hours after surgery. Patients should speak with the provider who prescribes their  warfarin to determine the best treatment regimen before and after surgery. TREATMENT OF LOW PLATELETS - Patients with mild thrombocytopenia (low platelet count) due to antiphospholipid syndrome (APS) usually require no treatment. In more severe cases, medications (including steroids or immune globulins) are used to help increase the platelet count. ANTIPHOSPHOLIPID SYNDROME AND PREGNANCY - Pregnant women with antiphospholipid syndrome (APS) have an increased risk of developing a thrombosis (blood clot in a vein or artery) and of having a miscarriage compared with pregnant women without APS. They may also be at risk for other pregnancy-related complications, including preeclampsia and decreased blood flow to the fetus. Several treatments are available to reduce these risks. Treatment depends upon the woman's previous history of blood clots, miscarriage, stillbirth, and preeclampsia and upon her current antibody levels. For women who have antiphospholipid antibodies but no history of blood clots or miscarriage, treatment may or may not be recommended. The following is a general description of available treatments. Aspirin - Low-dose aspirin may be used for pregnant women with APS in combination with other treatments, including heparin injections. Low-dose aspirin can be started before the woman attempts to become pregnant and is usually stopped sometime after two months to 36 weeks of gestation. Aspirin can be restarted after delivery and is usually taken for at least six to eight weeks postpartum. Heparin - Heparin must be given as an injection, either under the skin or into a vein. Most pregnant women who use heparin are taught to give their own injections into the skin. There are two types of heparin: unfractionated and low molecular weight heparin (LMWH). ?Unfractionated heparin must be injected twice per day and has a risk of causing excessive bleeding, a low platelet count, and osteoporosis. ?LMWH is  usually injected once per day and has a lower risk of excessive bleeding, low platelet count, and bone thinning (osteoporosis) compared with unfractionated heparin. Heparin is usually started once pregnancy is confirmed and is usually stopped temporarily for planned procedures (eg, amniocentesis, cesarean section) and when labor begins. Either heparin or warfarin is recommended for six to eight weeks after delivery. Warfarin - Warfarin is not usually recommended for pregnant women with APS because of the potential risks to the developing fetus. If warfarin is used, it must be stopped before six weeks of pregnancy. It may be restarted after 14 weeks of pregnancy but must be discontinued by 36 weeks of pregnancy. Another anticoagulant (eg, heparin) is usually recommended when warfarin is not used. Warfarin can be restarted after delivery and is often the preferred treatment for women with APS during the postpartum stage (the six to eight weeks following delivery). Warfarin is safe to take while breastfeeding. Preventing pregnancy - Women with APS should not use birth control methods that contain estrogen because estrogen can increase the risk of developing a blood clot. A number of other methods of birth control are available, including progestin-only pills, an intrauterine device, condoms, a diaphragm, an injection (eg, Depo Provera), or a surgical procedure. These options are discussed separately. (See "Patient information: Birth control; which method is right for me? (Beyond the Basics)".) WHERE TO GET MORE INFORMATION - Your healthcare provider is the best source of information for questions and concerns related to your medical problem. This article will be updated as needed on our web site [email or web address]). Related topics for patients, as well as selected articles written for  healthcare professionals, are also available. Some of the most relevant are listed below.

## 2013-06-09 NOTE — Progress Notes (Signed)
[facility] OFFICE PROGRESS NOTE  Rudi Heap, MD [address] [place] [address]  DIAGNOSIS: Pulmonary embolism, bilateral- moderate clot burden - Plan: CBC with Differential, Comprehensive metabolic panel  Tobacco abuse - Plan: CBC with Differential, Comprehensive metabolic panel  Thrombocytosis - Plan: CBC with Differential, Comprehensive metabolic panel  DVT, recurrent, lower extremity, acute, unspecified laterality - Plan: CBC with Differential, Comprehensive metabolic panel  Chief Complaint  Patient presents with  . Pulmonary embolism, bilateral- moderate clot burden    CURRENT THERAPY: Xalreto 20 mg daily (started 05/2012)  INTERVAL HISTORY: [name] [name] 57 y.o. female with a history of hypercoagulable state secondary to lupus anticoagulant is here for follow-up.  She was last seen by Dr. Eli Phillips on 03/10/2013. [name] [name] had a laminectomy at [facility] on 04/05/2012 with Dr. Burtis Junes. On 05/12/2012 a lower venous duplex was positive for a left lower extremity DVT. A CT angiogram on 04/20/2012 was positive for bilateral pulmonary emboli. She was started on a Lovenox bridge and then transitioned to Coumadin. She began having right lower for extremity pain/swelling and was found to have a right lower extremity DVT on 04/23/2012 despite having a therapeutic INR. A suprarenal IVC filter was placed on 05/19/2012. On 05/24/2012 she was found to have and IVC thrombus and underwent a mechanical thrombectomy and bilateral lower extremity angioplasty with stent placement. She began taking Xarelto 05/2012. On 07/22/2012 the IVC filter was removed by Dr. Fredia Sorrow.   Today, [name] [name] states that she been doing well. She states that most her swelling in her lower extremities have resolved.  She does report intermittent bilateral lower extremity swelling (foot areas), that spontaneously resolve. She denies any other symptomatology during today's  visit including any unusual bleeding, bruising, night sweats, fever, chills, headaches or constipation. She remains physically active and has a good appetite. 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 [name] [name] continues to smoke 1/2 pack of cigarettes daily and declined smoking cessation assistance.  She reports receiving the flu shot.  Last year, she had a colonoscopy is she reports it was negative for malignancy.  She will schedule her mammogram for later this year.   MEDICAL HISTORY: Past Medical History  Diagnosis Date  . DVT (deep venous thrombosis)   . Pulmonary embolism   . Back pain     INTERIM HISTORY: has Pulmonary embolism, bilateral- moderate clot burden; DVT of left lower limb, acute; Dehydration with hyponatremia; Anemia; Hypokalemia; ARF (acute renal failure)- baseline creatinine 0.55; Tobacco abuse; HTN (hypertension); History of back surgery; Leukocytosis; Sinus tachycardia; Sepsis(995.91); Lower GI bleed; DVT of leg (deep venous thrombosis); Acute blood loss anemia; Hypercoagulation syndrome; Iron deficiency; Hyponatremia; Constipation, chronic; DVT, recurrent, lower extremity, acute- history of LLE DVT 04/2012; Thrombocytosis; Fever; Physical deconditioning; and Severe muscle deconditioning on her problem list.    ALLERGIES:  is allergic to prednisone and tramadol.  MEDICATIONS: has a current medication list which includes the following prescription(s): acetaminophen, lisinopril, pantoprazole, and xarelto.  SURGICAL HISTORY:  Past Surgical History  Procedure Laterality Date  . Back surgery    . Colonoscopy  05/19/2012    Procedure: COLONOSCOPY;  Surgeon: Theda Belfast, MD;  Location: [facility] ENDOSCOPY;  Service: Endoscopy;  Laterality: N/A;  . Esophagogastroduodenoscopy  05/19/2012    Procedure: ESOPHAGOGASTRODUODENOSCOPY (EGD);  Surgeon: Theda Belfast, MD;  Location: [facility] ENDOSCOPY;  Service: Endoscopy;  Laterality: N/A;    REVIEW OF  SYSTEMS:   Constitutional: Denies fevers,  chills or abnormal weight loss Eyes: Denies blurriness of vision Ears, nose, mouth, throat, and face: Denies mucositis or sore throat Respiratory: Denies cough, dyspnea or wheezes Cardiovascular: Denies palpitation, chest discomfort or lower extremity swelling Gastrointestinal:  Denies nausea, heartburn or change in bowel habits Skin: Denies abnormal skin rashes Lymphatics: Denies new lymphadenopathy or easy bruising Neurological:Denies numbness, tingling or new weaknesses Behavioral/Psych: Mood is stable, no new changes  All other systems were reviewed with the patient and are negative.  PHYSICAL EXAMINATION: ECOG PERFORMANCE STATUS: 0 - Asymptomatic  Blood pressure 164/83, pulse 104, temperature 98 F (36.7 C), temperature source Oral, resp. rate 20, height 5' 4.5" (1.638 m), weight 153 lb 8 oz (69.627 kg).  GENERAL:alert, no distress and comfortable; Pleasant well developed and well nourished female.  SKIN: skin color, texture, turgor are normal, no rashes or significant lesions EYES: normal, Conjunctiva are pink and non-injected, sclera clear OROPHARYNX:no exudate, no erythema and lips, buccal mucosa, and tongue normal  NECK: supple, thyroid normal size, non-tender, without nodularity LYMPH:  no palpable lymphadenopathy in the cervical, axillary or supraclavicular LUNGS: clear to auscultation and percussion with normal breathing effort HEART: regular rate & rhythm and no murmurs and no lower extremity edema ABDOMEN:abdomen soft, non-tender and normal bowel sounds Musculoskeletal:no cyanosis of digits and no clubbing  NEURO: alert & oriented x 3 with fluent speech, no focal motor/sensory deficits  Labs:  Lab Results  Component Value Date   WBC 11.0* 06/09/2013   HGB 13.3 06/09/2013   HCT 38.7 06/09/2013   MCV 88.2 06/09/2013   PLT 439* 06/09/2013   NEUTROABS 7.4* 06/09/2013      Chemistry      Component Value Date/Time   NA 140  06/09/2013 1127   NA 141 07/22/2012 0919   K 3.9 06/09/2013 1127   K 2.8* 07/22/2012 0919   CL 105 09/16/2012 0821   CL 100 07/22/2012 0919   CO2 23 06/09/2013 1127   CO2 26 07/22/2012 0919   BUN 7.3 06/09/2013 1127   BUN 5* 07/22/2012 0919   CREATININE 0.7 06/09/2013 1127   CREATININE 0.43* 07/22/2012 0919      Component Value Date/Time   CALCIUM 10.1 06/09/2013 1127   CALCIUM 9.7 07/22/2012 0919   ALKPHOS 119 06/09/2013 1127   ALKPHOS 274* 05/21/2012 0445   AST 12 06/09/2013 1127   AST 27 05/21/2012 0445   ALT 6 06/09/2013 1127   ALT 18 05/21/2012 0445   BILITOT 0.35 06/09/2013 1127   BILITOT 0.4 05/21/2012 0445     CBC:  Recent Labs Lab 06/09/13 1127  WBC 11.0*  NEUTROABS 7.4*  HGB 13.3  HCT 38.7  MCV 88.2  PLT 439*   Procedures:  1. EGD on 05/19/2012 showed a normal EGD.  2. Colonoscopy on 05/19/2012 showed an anterior anal fissure. (The indication for colonoscopy was listed as hematochezia - [name] [name] denies having hematochezia)   Imaging Studies:  1. CT Angiography chest on 04/20/2012 showed positive for moderate acute pulmonary embolism bilaterally. Mild atelectasis in both lungs. No pleural effusion or confluent airspace opacity.  2. US Venous duplex unilateral lower left on 05/12/2012 showed extensive deep vein thrombosis left lower extremity starting left  common femoral vein, femoral vein, popliteal vein down to the posterior tibial vein.  3. Acute abdomen series with chest on 05/18/2012 shows a nonspecific nonobstructive bowel gas pattern - no evidence of pneumoperitoneum. No evidence of active cardiopulmonary disease.  4. CT of abdomen and pelvis on 05/19/2012 showed occlusive  IVC thrombus extends from the left renal vein inferiorly into the bilateral common iliac, external iliac, and internal iliac veins. There is retroperitoneal and pelvic sidewall edema bilaterally with right greater than left proximal thigh edema. The edema is presumably secondary to the venous  thrombosis. The patient is noted to have bilateral pedicle screws at L4 and L5 and the left L5 pedicle screw passes through the anterior L5 cortex with the tip of the screw this apparently involving the left common iliac vein.   ASSESSMENT: Harmonie Verrastro 57 y.o. female with a history of Pulmonary embolism, bilateral- moderate clot burden - Plan: CBC with Differential, Comprehensive metabolic panel  Tobacco abuse - Plan: CBC with Differential, Comprehensive metabolic panel  Thrombocytosis - Plan: CBC with Differential, Comprehensive metabolic panel  DVT, recurrent, lower extremity, acute, unspecified laterality - Plan: CBC with Differential, Comprehensive metabolic panel   PLAN:  1. PE, bilateral - moderate clot burden -- She continues to do well on Xarelto , no more clotting event and no bleeding episodes.   2. DVT, recurrent, lower extremity --As noted above.   3. Lupus Anticoagulant. -- She will require anticoagulation indefinitely.  She was provided a handout.   4. Tobacco Abuse. --She continues to smoke nearly half a pack a day.  She did not want to attend smoking cessation classes.    5. Thrombocytosis.  --Likely reactive to #4.  We will continue to monitor closely.   6. Follow-up.  --We plan to see [name] [name] in 3 months and at which time we will check a CBC, CMP and LDH.   All questions were answered. The patient knows to call the clinic with any problems, questions or concerns. We can certainly see the patient much sooner if necessary.  I spent 15 minutes counseling the patient face to face. The total time spent in the appointment was 25 minutes.     Tabbetha Kutscher, MD 06/12/2013 6:43 PM

## 2013-06-09 NOTE — Telephone Encounter (Signed)
appts made and printed...td 

## 2013-06-23 ENCOUNTER — Other Ambulatory Visit: Payer: Self-pay | Admitting: Nurse Practitioner

## 2013-06-27 ENCOUNTER — Telehealth: Payer: Self-pay | Admitting: Nurse Practitioner

## 2013-07-03 ENCOUNTER — Other Ambulatory Visit: Payer: Self-pay | Admitting: Nurse Practitioner

## 2013-07-04 NOTE — Telephone Encounter (Signed)
PLEASE REVIEW AND REFILL IF APPROPRIATE. THANKS.

## 2013-07-04 NOTE — Telephone Encounter (Signed)
Pleas e contact oncologist and make sure this is what they want her on

## 2013-07-07 ENCOUNTER — Other Ambulatory Visit: Payer: Self-pay | Admitting: Nurse Practitioner

## 2013-08-21 ENCOUNTER — Other Ambulatory Visit: Payer: Self-pay | Admitting: Family Medicine

## 2013-08-22 ENCOUNTER — Telehealth: Payer: Self-pay | Admitting: Nurse Practitioner

## 2013-08-22 NOTE — Telephone Encounter (Signed)
appt given per patients request 

## 2013-08-25 ENCOUNTER — Encounter: Payer: Self-pay | Admitting: Nurse Practitioner

## 2013-08-25 ENCOUNTER — Ambulatory Visit (INDEPENDENT_AMBULATORY_CARE_PROVIDER_SITE_OTHER): Payer: BC Managed Care – PPO | Admitting: Nurse Practitioner

## 2013-08-25 ENCOUNTER — Encounter (INDEPENDENT_AMBULATORY_CARE_PROVIDER_SITE_OTHER): Payer: Self-pay

## 2013-08-25 VITALS — BP 163/89 | HR 108 | Temp 99.3°F | Ht 64.5 in | Wt 158.0 lb

## 2013-08-25 DIAGNOSIS — E876 Hypokalemia: Secondary | ICD-10-CM

## 2013-08-25 DIAGNOSIS — Z0000 Encounter for general adult medical examination without abnormal findings: Secondary | ICD-10-CM

## 2013-08-25 DIAGNOSIS — D649 Anemia, unspecified: Secondary | ICD-10-CM

## 2013-08-25 DIAGNOSIS — Z23 Encounter for immunization: Secondary | ICD-10-CM

## 2013-08-25 DIAGNOSIS — I10 Essential (primary) hypertension: Secondary | ICD-10-CM

## 2013-08-25 DIAGNOSIS — E871 Hypo-osmolality and hyponatremia: Secondary | ICD-10-CM

## 2013-08-25 MED ORDER — LISINOPRIL 20 MG PO TABS: 20.0000 mg | ORAL_TABLET | Freq: Every day | ORAL | Status: DC

## 2013-08-25 MED ORDER — PANTOPRAZOLE SODIUM 40 MG PO TBEC: 40.0000 mg | DELAYED_RELEASE_TABLET | Freq: Every day | ORAL | Status: DC

## 2013-08-25 NOTE — Progress Notes (Signed)
Subjective:    Patient ID: [name] [name], female    DOB: [birth year], 58 y.o.   MRN: [id number]  HPI  Patient here today for follow up- she is doing well- Has history of DVT and had a greenfield filter for awhile which was removed for some unknown reason. She is currently on xeralto which she does not like and is going to talk to hematologist about changing med- Patient is doing well right now without complaints. Patient Active Problem List   Diagnosis Date Noted  . Severe muscle deconditioning 05/28/2012  . Physical deconditioning 05/25/2012  . Fever 05/22/2012  . Hypercoagulation syndrome 05/19/2012  . Iron deficiency 05/19/2012  . Hyponatremia 05/19/2012  . Constipation, chronic 05/19/2012  . DVT, recurrent, lower extremity, acute- history of LLE DVT 04/2012 05/19/2012  . Thrombocytosis 05/19/2012  . Sepsis(995.91) 05/18/2012  . Lower GI bleed 05/18/2012  . DVT of leg (deep venous thrombosis) 05/18/2012  . Acute blood loss anemia 05/18/2012  . History of back surgery 04/21/2012  . Leukocytosis 04/21/2012  . Sinus tachycardia 04/21/2012  . Pulmonary embolism, bilateral- moderate clot burden 04/20/2012  . DVT of left lower limb, acute 04/20/2012  . Dehydration with hyponatremia 04/20/2012  . Anemia 04/20/2012  . Hypokalemia 04/20/2012  . ARF (acute renal failure)- baseline creatinine 0.55 04/20/2012  . Tobacco abuse 04/20/2012  . HTN (hypertension) 04/20/2012   Outpatient Encounter Prescriptions as of 08/25/2013  Medication Sig  . acetaminophen (TYLENOL) 500 MG tablet Take 500 mg by mouth every 6 (six) hours as needed. For pain  . lisinopril (PRINIVIL,ZESTRIL) 20 MG tablet TAKE 1 TABLET EVERY DAY  . pantoprazole (PROTONIX) 40 MG tablet TAKE 1 TABLET (40 MG TOTAL) BY MOUTH DAILY.  Marland Kitchen XARELTO 20 MG TABS tablet TAKE 1 TABLET BY MOUTH ONCE DAILY      Review of Systems  Constitutional: Negative.   HENT: Negative.   Respiratory: Negative.   Cardiovascular: Negative.     Gastrointestinal: Negative.   Genitourinary: Negative.   Neurological: Negative.   Hematological: Negative.        Objective:   Physical Exam  Constitutional: She is oriented to person, place, and time. She appears well-developed and well-nourished.  HENT:  Nose: Nose normal.  Mouth/Throat: Oropharynx is clear and moist.  Eyes: EOM are normal.  Neck: Trachea normal, normal range of motion and full passive range of motion without pain. Neck supple. No JVD present. Carotid bruit is not present. No thyromegaly present.  Cardiovascular: Normal rate, regular rhythm, normal heart sounds and intact distal pulses.  Exam reveals no gallop and no friction rub.   No murmur heard. Pulmonary/Chest: Effort normal and breath sounds normal.  Abdominal: Soft. Bowel sounds are normal. She exhibits no distension and no mass. There is no tenderness.  Musculoskeletal: Normal range of motion.  Lymphadenopathy:    She has no cervical adenopathy.  Neurological: She is alert and oriented to person, place, and time. She has normal reflexes.  Skin: Skin is warm and dry.  Mild vascular skin discoloration of right medial ankle  Psychiatric: She has a normal mood and affect. Her behavior is normal. Judgment and thought content normal.    BP 163/89  Pulse 108  Temp(Src) 99.3 F (37.4 C) (Oral)  Ht 5' 4.5" (1.638 m)  Wt 158 lb (71.668 kg)  BMI 26.71 kg/m2       Assessment & Plan:   1. Annual physical exam   2. Hyponatremia   3. Hypokalemia   4. HTN (hypertension)  5. Anemia    Orders Placed This Encounter  Procedures  . CMP14+EGFR  . NMR, lipoprofile   Meds ordered this encounter  Medications  . lisinopril (PRINIVIL,ZESTRIL) 20 MG tablet    Sig: Take 1 tablet (20 mg total) by mouth daily.    Dispense:  90 tablet    Refill:  1    Order Specific Question:  Supervising Provider    Answer:  Chipper Herb [[id number]]  . pantoprazole (PROTONIX) 40 MG tablet    Sig: Take 1 tablet (40 mg total)  by mouth daily.    Dispense:  90 tablet    Refill:  1    Order Specific Question:  Supervising Provider    Answer:  Joycelyn Man    Continue all meds Labs pending Diet and exercise encouraged Health maintenance reviewed Follow up in 6 months  [place], FNP

## 2013-08-25 NOTE — Patient Instructions (Signed)

## 2013-08-28 LAB — CMP14+EGFR
ALT: 8 IU/L (ref 0–32)
AST: 8 IU/L (ref 0–40)
Albumin/Globulin Ratio: 1.6 (ref 1.1–2.5)
Albumin: 4.3 g/dL (ref 3.5–5.5)
Alkaline Phosphatase: 115 IU/L (ref 39–117)
BUN/Creatinine Ratio: 10 (ref 9–23)
BUN: 5 mg/dL — ABNORMAL LOW (ref 6–24)
CO2: 22 mmol/L (ref 18–29)
Calcium: 9.8 mg/dL (ref 8.7–10.2)
Chloride: 101 mmol/L (ref 97–108)
Creatinine, Ser: 0.52 mg/dL — ABNORMAL LOW (ref 0.57–1.00)
GFR calc Af Amer: 122 mL/min/{1.73_m2} (ref >59)
GFR calc non Af Amer: 106 mL/min/{1.73_m2} (ref >59)
Globulin, Total: 2.7 g/dL (ref 1.5–4.5)
Glucose: 108 mg/dL — ABNORMAL HIGH (ref 65–99)
Potassium: 3.7 mmol/L (ref 3.5–5.2)
Sodium: 143 mmol/L (ref 134–144)
Total Bilirubin: <0.2 mg/dL (ref 0.0–1.2)
Total Protein: 7 g/dL (ref 6.0–8.5)

## 2013-08-28 LAB — NMR, LIPOPROFILE
Cholesterol: 321 mg/dL — ABNORMAL HIGH (ref <200)
HDL Cholesterol by NMR: 32 mg/dL — ABNORMAL LOW (ref >=40)
HDL Particle Number: 24.6 umol/L — ABNORMAL LOW (ref >=30.5)
LDL Particle Number: 2603 nmol/L — ABNORMAL HIGH (ref <1000)
LDL Size: 19.6 nm — ABNORMAL LOW (ref >20.5)
LP-IR Score: 83 — ABNORMAL HIGH (ref <=45)
Small LDL Particle Number: >2300 nmol/L — ABNORMAL HIGH (ref <=527)
Triglycerides by NMR: 594 mg/dL — ABNORMAL HIGH (ref <150)

## 2013-08-29 ENCOUNTER — Telehealth: Payer: Self-pay | Admitting: Nurse Practitioner

## 2013-08-31 ENCOUNTER — Other Ambulatory Visit: Payer: Self-pay | Admitting: Nurse Practitioner

## 2013-08-31 ENCOUNTER — Telehealth: Payer: Self-pay | Admitting: *Deleted

## 2013-08-31 MED ORDER — ATORVASTATIN CALCIUM 40 MG PO TABS: 40.0000 mg | ORAL_TABLET | Freq: Every day | ORAL | Status: DC

## 2013-08-31 MED ORDER — ANTARA 90 MG PO CAPS: 90.0000 mg | ORAL_CAPSULE | Freq: Every day | ORAL | Status: DC

## 2013-08-31 NOTE — Telephone Encounter (Signed)
xeralto doesn't usually affect blood sugar- no diet pills- lipitor an dantara rx sent to pharmacy- come by nd pick up antara coupon before getting rx

## 2013-08-31 NOTE — Telephone Encounter (Signed)
Pt wants to know if xarelto can cause increased triglycerides and choleserol. Pt agrees to take both cholesterol meds. [facility]  Pt wants weight loss medication called in but I explained to her that we wouldn't prescribe for her.

## 2013-08-31 NOTE — Telephone Encounter (Signed)
PT NOTIFIED AND WILL PICKUP COUPON AT [facility].

## 2013-08-31 NOTE — Telephone Encounter (Signed)
done

## 2013-09-22 ENCOUNTER — Ambulatory Visit (HOSPITAL_BASED_OUTPATIENT_CLINIC_OR_DEPARTMENT_OTHER): Payer: BC Managed Care – PPO | Admitting: Internal Medicine

## 2013-09-22 ENCOUNTER — Other Ambulatory Visit (HOSPITAL_BASED_OUTPATIENT_CLINIC_OR_DEPARTMENT_OTHER): Payer: BC Managed Care – PPO

## 2013-09-22 ENCOUNTER — Telehealth: Payer: Self-pay | Admitting: Internal Medicine

## 2013-09-22 VITALS — BP 170/82 | HR 104 | Temp 97.8°F | Resp 19 | Ht 64.5 in | Wt 156.4 lb

## 2013-09-22 DIAGNOSIS — D6859 Other primary thrombophilia: Secondary | ICD-10-CM

## 2013-09-22 DIAGNOSIS — Z720 Tobacco use: Secondary | ICD-10-CM

## 2013-09-22 DIAGNOSIS — I82409 Acute embolism and thrombosis of unspecified deep veins of unspecified lower extremity: Secondary | ICD-10-CM

## 2013-09-22 DIAGNOSIS — I2699 Other pulmonary embolism without acute cor pulmonale: Secondary | ICD-10-CM

## 2013-09-22 DIAGNOSIS — D473 Essential (hemorrhagic) thrombocythemia: Secondary | ICD-10-CM

## 2013-09-22 DIAGNOSIS — F17200 Nicotine dependence, unspecified, uncomplicated: Secondary | ICD-10-CM

## 2013-09-22 DIAGNOSIS — D75839 Thrombocytosis, unspecified: Secondary | ICD-10-CM

## 2013-09-22 LAB — CBC WITH DIFFERENTIAL/PLATELET
BASO%: 0.5 % (ref 0.0–2.0)
Basophils Absolute: 0.1 10*3/uL (ref 0.0–0.1)
EOS%: 1.9 % (ref 0.0–7.0)
Eosinophils Absolute: 0.2 10*3/uL (ref 0.0–0.5)
HCT: 38.4 % (ref 34.8–46.6)
HGB: 12.9 g/dL (ref 11.6–15.9)
LYMPH%: 23.8 % (ref 14.0–49.7)
MCH: 29.3 pg (ref 25.1–34.0)
MCHC: 33.5 g/dL (ref 31.5–36.0)
MCV: 87.7 fL (ref 79.5–101.0)
MONO#: 0.5 10*3/uL (ref 0.1–0.9)
MONO%: 4.7 % (ref 0.0–14.0)
NEUT#: 7.9 10*3/uL — ABNORMAL HIGH (ref 1.5–6.5)
NEUT%: 69.1 % (ref 38.4–76.8)
Platelets: 503 10*3/uL — ABNORMAL HIGH (ref 145–400)
RBC: 4.38 10*6/uL (ref 3.70–5.45)
RDW: 13.2 % (ref 11.2–14.5)
WBC: 11.4 10*3/uL — ABNORMAL HIGH (ref 3.9–10.3)
lymph#: 2.7 10*3/uL (ref 0.9–3.3)

## 2013-09-22 LAB — COMPREHENSIVE METABOLIC PANEL (CC13)
ALT: 9 U/L (ref 0–55)
AST: 14 U/L (ref 5–34)
Albumin: 4 g/dL (ref 3.5–5.0)
Alkaline Phosphatase: 105 U/L (ref 40–150)
Anion Gap: 10 mEq/L (ref 3–11)
BUN: 7.6 mg/dL (ref 7.0–26.0)
CO2: 24 mEq/L (ref 22–29)
Calcium: 10.1 mg/dL (ref 8.4–10.4)
Chloride: 107 mEq/L (ref 98–109)
Creatinine: 0.7 mg/dL (ref 0.6–1.1)
Glucose: 93 mg/dl (ref 70–140)
Potassium: 3.7 mEq/L (ref 3.5–5.1)
Sodium: 142 mEq/L (ref 136–145)
Total Bilirubin: 0.3 mg/dL (ref 0.20–1.20)
Total Protein: 7.7 g/dL (ref 6.4–8.3)

## 2013-09-22 NOTE — Telephone Encounter (Signed)
gv and printed appt sched and avs for pt for May °

## 2013-09-22 NOTE — Progress Notes (Signed)
[facility] OFFICE PROGRESS NOTE  Redge Gainer, MD [place] [address]  DIAGNOSIS: DVT, recurrent, lower extremity, acute- history of LLE DVT 04/2012  DVT of left lower limb, acute  DVT of leg (deep venous thrombosis)  Hypercoagulation syndrome  Tobacco abuse  Chief Complaint  Patient presents with  . Pulmonary embolism, bilateral- moderate clot burden    CURRENT THERAPY: Xalreto 20 mg daily (started 05/2012)  INTERVAL HISTORY: [name] [name] 58 y.o. female with a history of hypercoagulable state secondary to lupus anticoagulant is here for follow-up.  She was last seen by Dr. Ladene Artist on 03/10/2013. [name] [name] had a laminectomy at [facility] on 04/05/2012 with Dr. Aubery Lapping. On 05/12/2012 a lower venous duplex was positive for a left lower extremity DVT. A CT angiogram on 04/20/2012 was positive for bilateral pulmonary emboli. She was started on a Lovenox bridge and then transitioned to Coumadin. She began having right lower for extremity pain/swelling and was found to have a right lower extremity DVT on 04/23/2012 despite having a therapeutic INR. A suprarenal IVC filter was placed on 05/19/2012. On 05/24/2012 she was found to have and IVC thrombus and underwent a mechanical thrombectomy and bilateral lower extremity angioplasty with stent placement. She began taking Xarelto 05/2012. On 07/22/2012 the IVC filter was removed by Dr. Kathlene Cote.   Today, [name] [name] states that she been doing well. She reports since starting [employer] that she has gained 20 lbs and his decreased energy and wondered if this was caused by Evalina Field.  She denies any other symptomatology during today's visit including any unusual bleeding, bruising, night sweats, fever, chills, headaches or constipation. She remains physically active and has a good appetite. 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 Mrs. Cephus continues to  smoke 1/2 pack of cigarettes daily and declined smoking cessation assistance.    Last year, she had a colonoscopy is she reports it was negative for malignancy.  She will schedule her mammogram for later this year.   MEDICAL HISTORY: Past Medical History  Diagnosis Date  . DVT (deep venous thrombosis)   . Pulmonary embolism   . Back pain     INTERIM HISTORY: has Pulmonary embolism, bilateral- moderate clot burden; DVT of left lower limb, acute; Dehydration with hyponatremia; Anemia; Hypokalemia; ARF (acute renal failure)- baseline creatinine 0.55; Tobacco abuse; HTN (hypertension); History of back surgery; Leukocytosis; Sinus tachycardia; Sepsis(995.91); Lower GI bleed; DVT of leg (deep venous thrombosis); Acute blood loss anemia; Hypercoagulation syndrome; Iron deficiency; Hyponatremia; Constipation, chronic; DVT, recurrent, lower extremity, acute- history of LLE DVT 04/2012; Thrombocytosis; Fever; Physical deconditioning; and Severe muscle deconditioning on her problem list.    ALLERGIES:  is allergic to prednisone and tramadol.  MEDICATIONS: has a current medication list which includes the following prescription(s): acetaminophen, antara, atorvastatin, lisinopril, pantoprazole, and xarelto.  SURGICAL HISTORY:  Past Surgical History  Procedure Laterality Date  . Back surgery    . Colonoscopy  05/19/2012    Procedure: COLONOSCOPY;  Surgeon: Beryle Beams, MD;  Location: [facility] ENDOSCOPY;  Service: Endoscopy;  Laterality: N/A;  . Esophagogastroduodenoscopy  05/19/2012    Procedure: ESOPHAGOGASTRODUODENOSCOPY (EGD);  Surgeon: Beryle Beams, MD;  Location: [facility] ENDOSCOPY;  Service: Endoscopy;  Laterality: N/A;    REVIEW OF SYSTEMS:   Constitutional: Denies fevers, chills or abnormal weight loss Eyes: Denies blurriness of vision Ears, nose, mouth, throat, and face: Denies mucositis or sore throat Respiratory: Denies cough, dyspnea or wheezes  Cardiovascular: Denies palpitation, chest discomfort  or lower extremity swelling Gastrointestinal:  Denies nausea, heartburn or change in bowel habits Skin: Denies abnormal skin rashes Lymphatics: Denies new lymphadenopathy or easy bruising Neurological:Denies numbness, tingling or new weaknesses Behavioral/Psych: Mood is stable, no new changes  All other systems were reviewed with the patient and are negative.  PHYSICAL EXAMINATION: ECOG PERFORMANCE STATUS: 0 - Asymptomatic  Blood pressure 170/82, pulse 104, temperature 97.8 F (36.6 C), temperature source Oral, resp. rate 19, height 5' 4.5" (1.638 m), weight 156 lb 6.4 oz (70.943 kg).  GENERAL:alert, no distress and comfortable; Pleasant well developed and well nourished female.  SKIN: skin color, texture, turgor are normal, no rashes or significant lesions EYES: normal, Conjunctiva are pink and non-injected, sclera clear OROPHARYNX:no exudate, no erythema and lips, buccal mucosa, and tongue normal  NECK: supple, thyroid normal size, non-tender, without nodularity LYMPH:  no palpable lymphadenopathy in the cervical, axillary or supraclavicular LUNGS: clear to auscultation and percussion with normal breathing effort HEART: regular rate & rhythm and no murmurs and no lower extremity edema ABDOMEN:abdomen soft, non-tender and normal bowel sounds Musculoskeletal:no cyanosis of digits and no clubbing  NEURO: alert & oriented x 3 with fluent speech, no focal motor/sensory deficits  Labs:  Lab Results  Component Value Date   WBC 11.4* 09/22/2013   HGB 12.9 09/22/2013   HCT 38.4 09/22/2013   MCV 87.7 09/22/2013   PLT 503* 09/22/2013   NEUTROABS 7.9* 09/22/2013      Chemistry      Component Value Date/Time   NA 142 09/22/2013 1213   NA 143 08/25/2013 1612   NA 141 07/22/2012 0919   K 3.7 09/22/2013 1213   K 3.7 08/25/2013 1612   CL 101 08/25/2013 1612   CL 105 09/16/2012 0821   CO2 24 09/22/2013 1213   CO2 22 08/25/2013 1612   BUN 7.6 09/22/2013 1213   BUN 5* 08/25/2013 1612   BUN 5* 07/22/2012 0919    CREATININE 0.7 09/22/2013 1213   CREATININE 0.52* 08/25/2013 1612      Component Value Date/Time   CALCIUM 10.1 09/22/2013 1213   CALCIUM 9.8 08/25/2013 1612   ALKPHOS 105 09/22/2013 1213   ALKPHOS 115 08/25/2013 1612   AST 14 09/22/2013 1213   AST 8 08/25/2013 1612   ALT 9 09/22/2013 1213   ALT 8 08/25/2013 1612   BILITOT 0.30 09/22/2013 1213   BILITOT <0.2 08/25/2013 1612     CBC:  Recent Labs Lab 09/22/13 1213  WBC 11.4*  NEUTROABS 7.9*  HGB 12.9  HCT 38.4  MCV 87.7  PLT 503*   Procedures:  1. EGD on 05/19/2012 showed a normal EGD.  2. Colonoscopy on 05/19/2012 showed an anterior anal fissure. (The indication for colonoscopy was listed as hematochezia - Mrs. Bromm denies having hematochezia)   Imaging Studies:  1. CT Angiography chest on 04/20/2012 showed positive for moderate acute pulmonary embolism bilaterally. Mild atelectasis in both lungs. No pleural effusion or confluent airspace opacity.  2. US Venous duplex unilateral lower left on 05/12/2012 showed extensive deep vein thrombosis left lower extremity starting left  common femoral vein, femoral vein, popliteal vein down to the posterior tibial vein.  3. Acute abdomen series with chest on 05/18/2012 shows a nonspecific nonobstructive bowel gas pattern - no evidence of pneumoperitoneum. No evidence of active cardiopulmonary disease.  4. CT of abdomen and pelvis on 05/19/2012 showed occlusive IVC thrombus extends from the left renal vein inferiorly into the bilateral common iliac,  external iliac, and internal iliac veins. There is retroperitoneal and pelvic sidewall edema bilaterally with right greater than left proximal thigh edema. The edema is presumably secondary to the venous thrombosis. The patient is noted to have bilateral pedicle screws at L4 and L5 and the left L5 pedicle screw passes through the anterior L5 cortex with the tip of the screw this apparently involving the left common iliac vein.   ASSESSMENT: [name] [name] 58 y.o.  female with a history of DVT, recurrent, lower extremity, acute- history of LLE DVT 04/2012  DVT of left lower limb, acute  DVT of leg (deep venous thrombosis)  Hypercoagulation syndrome  Tobacco abuse   PLAN:  1. PE, bilateral - moderate clot burden -- She continues to do well on Xarelto , no more clotting event and no bleeding episodes. I reviewed extensively the side-effects of xalreto.  Weight gain is very uncommon.  Given her low energy, I think it would be product to start with thyroid testing rather than changing her medications at this time.  Her laboratory values today are reassuring and within normal limits.   2. DVT, recurrent, lower extremity --As noted above.   3. Lupus Anticoagulant. -- She will require anticoagulation indefinitely.  She was provided a handout.   4. Tobacco Abuse. --She continues to smoke nearly half a pack a day.  She did not want to attend smoking cessation classes.    5. Thrombocytosis.  --Likely reactive to #4.  We will continue to monitor closely.   6. Follow-up.  --We plan to see Mrs. Seifried in 3 months and at which time we will check a CBC, CMP and LDH.   All questions were answered. The patient knows to call the clinic with any problems, questions or concerns. We can certainly see the patient much sooner if necessary.  I spent 15 minutes counseling the patient face to face. The total time spent in the appointment was 25 minutes.     Shonia Skilling, MD 09/22/2013 1:22 PM

## 2013-09-22 NOTE — Patient Instructions (Signed)
Lupus Anticoagulant This test is used to help evaluate a prolonged activated partial thromboplastin time (aPTT) and/or a clotting episode. It is also used to help determine the cause of recurrent miscarriages, especially in the 2nd and 3rd trimesters. It is also used as a part of an evaluation for antiphospholipid syndrome. This is not a diagnostic test for lupus. Lupus anticoagulant is a protein that increases the risk of developing blood clots in both the veins and arteries. These clots may block blood flow in any part of the body, leading to strokes, heart attacks, pulmonary embolisms, deep vein thrombosis (usually clots in the legs), and to recurrent fetal loss, especially in the 2nd and 3rd trimesters (thought to be related to clotting in placental blood vessels). The lupus anticoagulant is an acquired, not inherited, condition. Although it is found most frequently in those with autoimmune diseases, such as Systemic Lupus Erythematosus (SLE) and HIV, the lupus anticoagulant may also be seen chronically or temporarily in those with infections or cancers and in those who are taking certain medications, such as phenothiazines, chlorpromazine, procainamide, and fansidar. It is thought to be present in about 1  2% of the general population, and may develop in people with no known risk factors.  The lupus anticoagulant (LA) is not a diagnostic test for lupus. It gets its name because it was first discovered to be associated with SLE.  PREPARATION FOR TEST  No preparation or fasting is necessary unless you have been informed otherwise. A blood sample is obtained by inserting a needle into a vein in the arm. NORMAL FINDINGS  Less than 23 GPL (IgG phospholipid units).  Less than 11 MPL (IgL phospholipid units). Ranges for normal findings may vary among different laboratories and hospitals. You should always check with your doctor after having lab work or other tests done to discuss the meaning of your test  results and whether your values are considered within normal limits. MEANING OF TEST  Your caregiver will go over the test results with you and discuss the importance and meaning of your results, as well as treatment options and the need for additional tests if necessary. OBTAINING THE TEST RESULTS It is your responsibility to obtain your test results. Ask the lab or department performing the test when and how you will get your results. Document Released: 09/06/2004 Document Revised: 10/27/2011 Document Reviewed: 07/17/2008 [facility] Patient Information 2014 [place]. Rivaroxaban oral tablets What is this medicine? RIVAROXABAN (ri va ROX a ban) is an anticoagulant (blood thinner). It is used to treat blood clots in the lungs or in the veins. It is also used after knee or hip surgeries to prevent blood clots. It is also used to lower the chance of stroke in people with a medical condition called atrial fibrillation. This medicine may be used for other purposes; ask your health care provider or pharmacist if you have questions. COMMON BRAND NAME(S): Xarelto What should I tell my health care provider before I take this medicine? They need to know if you have any of these conditions: -bleeding disorders -bleeding in the brain -blood in your stools (black or tarry stools) or if you have blood in your vomit -history of stomach bleeding -kidney disease -liver disease -low blood counts, like low white cell, platelet, or red cell counts -recent or planned spinal or epidural procedure -take medicines that treat or prevent blood clots -an unusual or allergic reaction to rivaroxaban, other medicines, foods, dyes, or preservatives -pregnant or trying to get pregnant -breast-feeding  How should I use this medicine? Take this medicine by mouth with a glass of water. Follow the directions on the prescription label. Take your medicine at regular intervals. Do not take it more often than directed. Do  not stop taking except on your doctor's advice. Stopping this medicine may increase your risk of a blot clot. Be sure to refill your prescription before you run out of medicine. If you are taking this medicine after hip or knee replacement surgery, take it with or without food. If you are taking this medicine for atrial fibrillation, take it with your evening meal. If you are taking this medicine to treat blood clots, take it with food at the same time each day. If you are unable to swallow your tablet, you may crush the tablet and mix it in applesauce. Then, immediately eat the applesauce. You should eat more food right after you eat the applesauce containing the crushed tablet. Talk to your pediatrician regarding the use of this medicine in children. Special care may be needed. Overdosage: If you think you have taken too much of this medicine contact a poison control center or emergency room at once. NOTE: This medicine is only for you. Do not share this medicine with others. What if I miss a dose? If you take your medicine once a day and miss a dose, take the missed dose as soon as you remember. If you take your medicine twice a day and miss a dose, take the missed dose immediately. In this instance, 2 tablets may be taken at the same time. The next day you should take 1 tablet twice a day as directed. What may interact with this medicine? -aspirin and aspirin-like medicines -certain antibiotics like erythromycin, azithromycin, and clarithromycin -certain medicines for fungal infections like ketoconazole and itraconazole -certain medicines for irregular heart beat like amiodarone, quinidine, dronedarone -certain medicines for seizures like carbamazepine, phenytoin -certain medicines that treat or prevent blood clots like warfarin, enoxaparin, and dalteparin  -conivaptan -diltiazem -felodipine -indinavir -lopinavir; ritonavir -NSAIDS, medicines for pain and inflammation, like ibuprofen or  naproxen -ranolazine -rifampin -ritonavir -St. John's wort -verapamil This list may not describe all possible interactions. Give your health care provider a list of all the medicines, herbs, non-prescription drugs, or dietary supplements you use. Also tell them if you smoke, drink alcohol, or use illegal drugs. Some items may interact with your medicine. What should I watch for while using this medicine? Visit your doctor or health care professional for regular checks on your progress. Your condition will be monitored carefully while you are receiving this medicine. Notify your doctor or health care professional and seek emergency treatment if you develop breathing problems; changes in vision; chest pain; severe, sudden headache; pain, swelling, warmth in the leg; trouble speaking; sudden numbness or weakness of the face, arm, or leg. These can be signs that your condition has gotten worse. If you are going to have surgery, tell your doctor or health care professional that you are taking this medicine. Tell your health care professional that you use this medicine before you have a spinal or epidural procedure. Sometimes people who take this medicine have bleeding problems around the spine when they have a spinal or epidural procedure. This bleeding is very rare. If you have a spinal or epidural procedure while on this medicine, call your health care professional immediately if you have back pain, numbness or tingling (especially in your legs and feet), muscle weakness, paralysis, or loss of bladder or bowel  control. Avoid sports and activities that might cause injury while you are using this medicine. Severe falls or injuries can cause unseen bleeding. Be careful when using sharp tools or knives. Consider using an Copy. Take special care brushing or flossing your teeth. Report any injuries, bruising, or red spots on the skin to your doctor or health care professional. What side effects may I  notice from receiving this medicine? Side effects that you should report to your doctor or health care professional as soon as possible: -allergic reactions like skin rash, itching or hives, swelling of the face, lips, or tongue -back pain -redness, blistering, peeling or loosening of the skin, including inside the mouth -signs and symptoms of bleeding such as bloody or black, tarry stools; red or dark-brown urine; spitting up blood or brown material that looks like coffee grounds; red spots on the skin; unusual bruising or bleeding from the eye, gums, or nose  Side effects that usually do not require medical attention (Report these to your doctor or health care professional if they continue or are bothersome.): -dizziness -muscle pain This list may not describe all possible side effects. Call your doctor for medical advice about side effects. You may report side effects to FDA at 1-800-FDA-1088. Where should I keep my medicine? Keep out of the reach of children. Store at room temperature between 15 and 30 degrees C (59 and 86 degrees F). Throw away any unused medicine after the expiration date. NOTE: This sheet is a summary. It may not cover all possible information. If you have questions about this medicine, talk to your doctor, pharmacist, or health care provider.  2014, [employer]. (2013-01-19 09:51:31)

## 2013-09-23 ENCOUNTER — Encounter: Payer: Self-pay | Admitting: Internal Medicine

## 2013-10-16 IMAGING — US IR IVC FILTER PLMT / S&I /IMG GUID/MOD SED
1 series · 2 of 2 positions shown · non-contrast
Comparison: Chest CT - 04/20/2012;

INDICATION: History of pulmonary embolism, now with bilateral
lower extremity DVT and lower GI bleed.

ULTRASOUND GUIDANCE FOR VASCULAR ACCESS
IVC CATHETERIZATION AND VENOGRAM
IVC FILTER INSERTION

[Series 1: ir ivc filter plmt / s&i /img guid/mod sed · 2 of 2 slices shown]
[im 1/2]
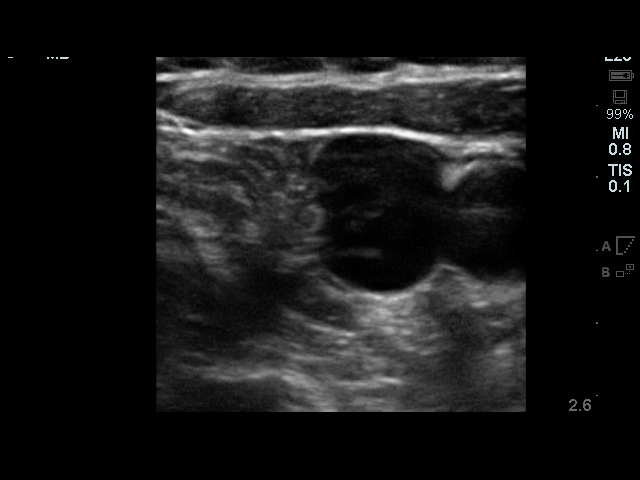
[im 2/2]
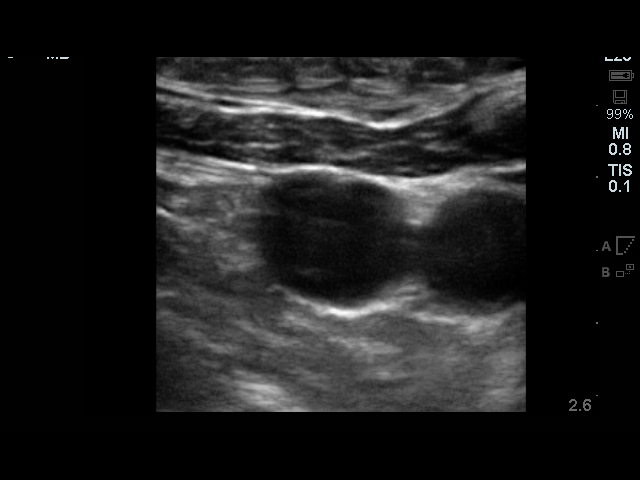

[2 of 2 positions shown; findings below may reference images not displayed]

left lower extremity venous
Doppler ultrasound - 05/12/2012; right lower extremity venous
Doppler ultrasound - earlier same day

Medications: Fentanyl 150 mcg IV; Versed 1.5 mg IV

Contrast: 100 mL [id number]

Sedation time: 30 minutes

Fluoroscopy time: 3.9 minutes

Complications: None immediate

PROCEDURE/FINDINGS:

Informed consent was obtained from the patient following
explanation of the procedure, risks (including non-retrieval and
caval thrombosis), benefits and alternatives.  The patient
understands, agrees and consents for the procedure.  All questions
were addressed.  A time out was performed prior to the initiation
of the procedure.

Maximal barrier sterile technique utilized including caps, mask,
sterile gowns, sterile gloves, large sterile drape, hand hygiene,
and Betadine prep.

Under sterile condition and local anesthesia, right internal
jugular venous access was performed with ultrasound.  An ultrasound
image was saved and sent to PACS.  Over a guide wire, the IVC
filter delivery sheath and inner dilator were advanced into the
superior aspect of the IVC.  As there was difficulty advancing the
guidewire more caudally, limited venogram was performed
demonstrating near complete thrombosis of the peri-renal IVC.  With
use of a regular Glidewire, the sheath was advanced more caudally
with a repeat venograms demonstrate near complete thrombosis of the
infra-renal IVC extending into the bilateral common iliac veins.

The introducer sheath was retracted and repeat venogram was
performed delineating the location of the right renal vein, however
there was no definite opacification of the left renal vein.  As
such, the decision was made to place a suprarenal IVC filter.

Through the delivery sheath, a retrievable Celect IVC filter was
deployed above the level of the right renal vein, below the caval
atrial junction.  Limited post deployment venogram confirmed
appropriate positioning.

The delivery sheath was removed and hemostasis was obtained with
manual compression.  A dressing was placed.  The patient tolerated
the procedure well without immediate post procedural complication.
IMPRESSION: 1.  Successful ultrasound and fluoroscopic guided placement of a
suprarenal retrievable IVC filter.

2.  Near complete thrombosis of the IVC to the level of the renal
veins with no definite opacification of the left renal vein.

Above findings discussed with Dr. Chato at [id number].

## 2013-10-16 IMAGING — CR DG ABDOMEN ACUTE W/ 1V CHEST
3 series · 3 of 3 positions shown · non-contrast
Comparison: None

CLINICAL DATA: Sepsis and abdominal pain.

ACUTE ABDOMEN SERIES (ABDOMEN 2 VIEW & CHEST 1 VIEW)

[w chest pa]
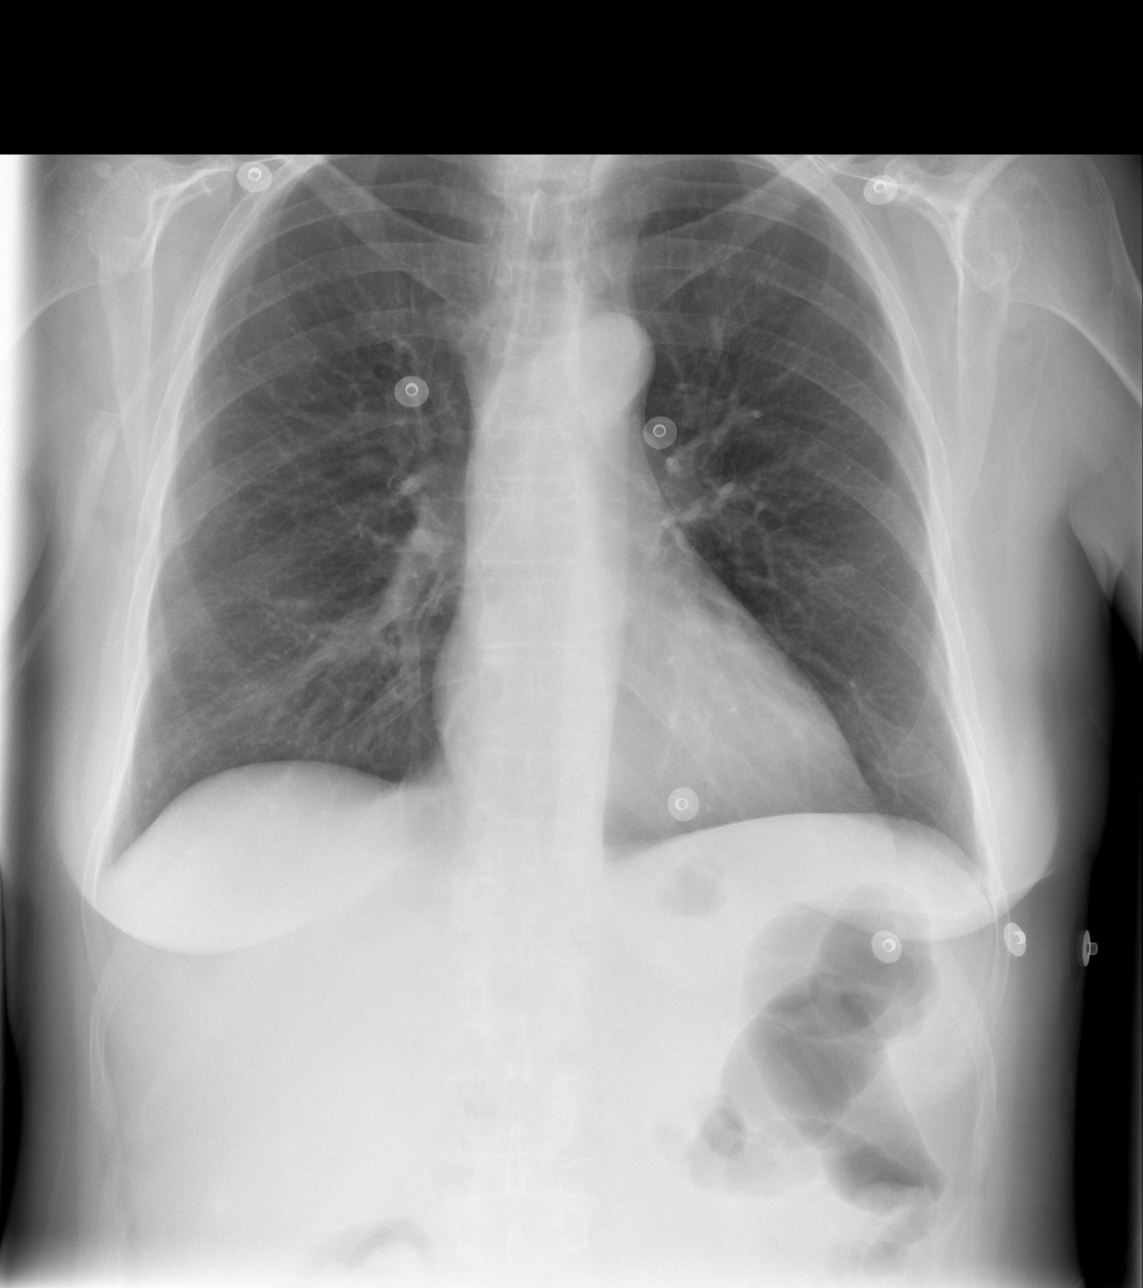

[w abdomen upright]
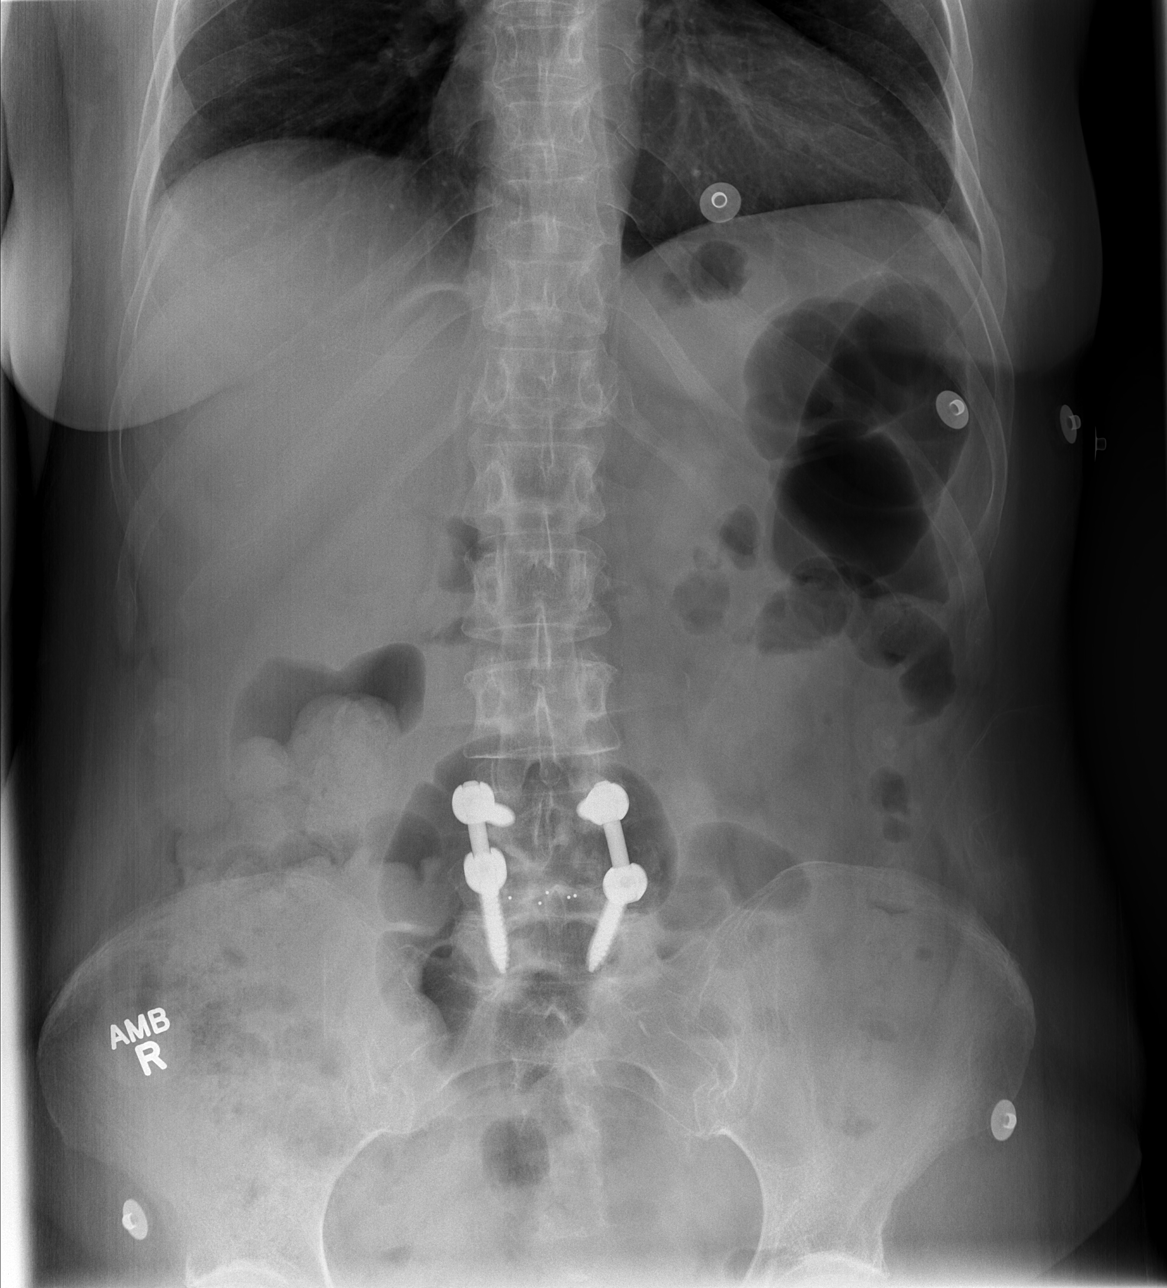

[t abdomen supine]
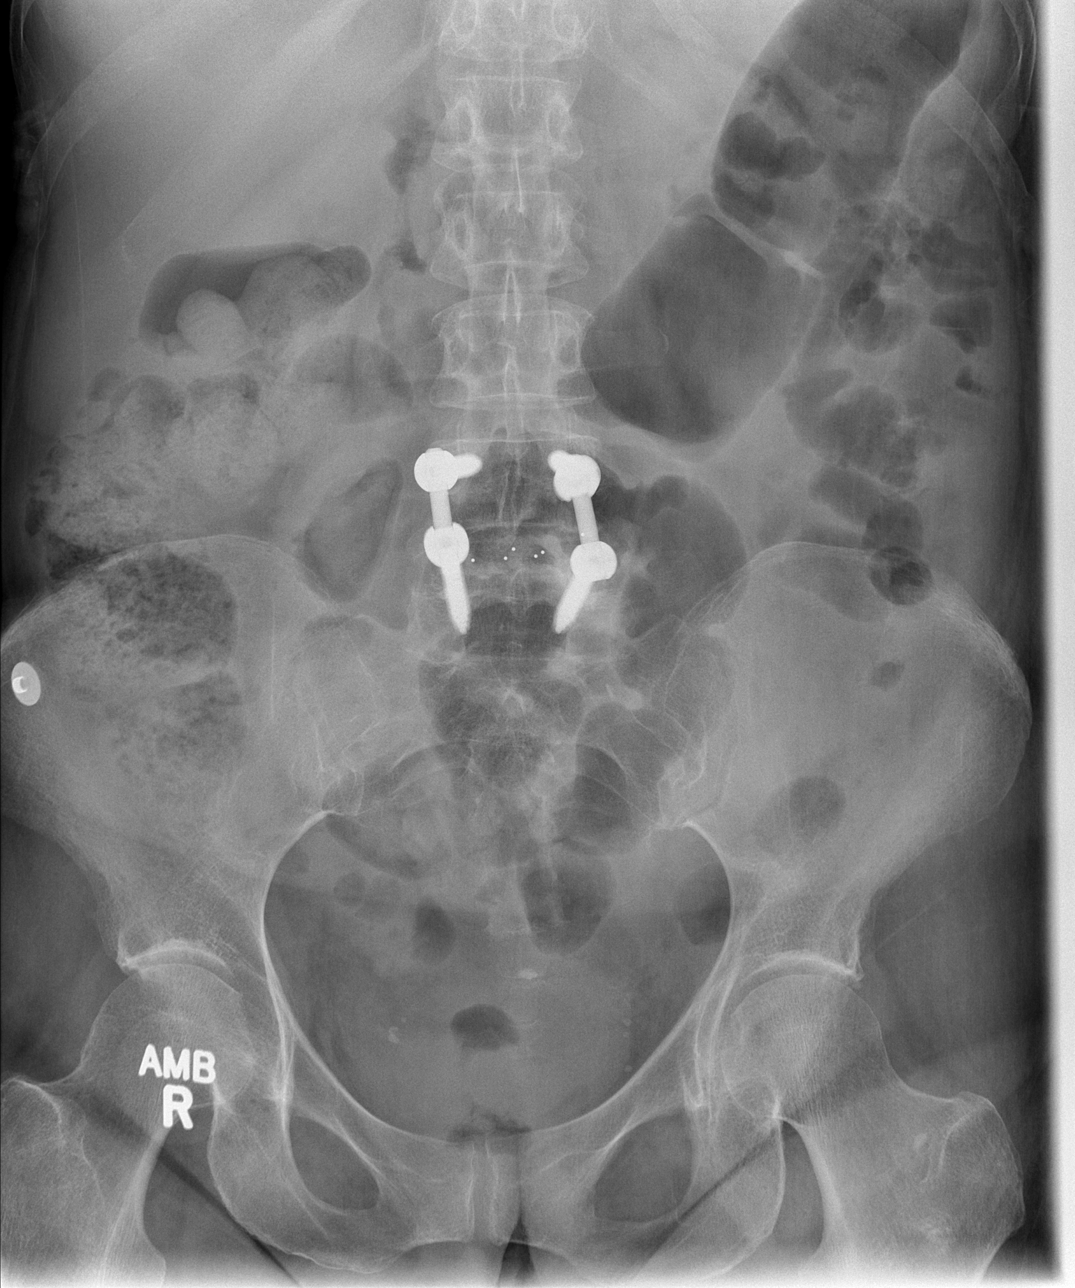

[3 of 3 positions shown; findings below may reference images not displayed]

FINDINGS: The cardiomediastinal silhouette is unremarkable.
The lungs are clear.
There is no of airspace disease, pleural effusion or pneumothorax.

Moderate stool in the ascending colon noted.
Gas is present in the remainder of the colon and rectum.
A few nondistended gas-filled loops small bowel within the left
abdomen are identified.
There is no evidence of bowel obstruction or pneumoperitoneum.
Postoperative changes of the lower lumbar spine are present.
IMPRESSION: Nonspecific nonobstructive bowel gas pattern - no evidence of
pneumoperitoneum.

No evidence of active cardiopulmonary disease.

## 2013-10-19 IMAGING — CR DG CHEST 1V PORT
1 series · 1 of 1 positions shown · non-contrast
Comparison: 04/20/2012

CLINICAL DATA: Fever.  Pulmonary embolus shown on last month's
chest CT.

PORTABLE CHEST - 1 VIEW

[AP]
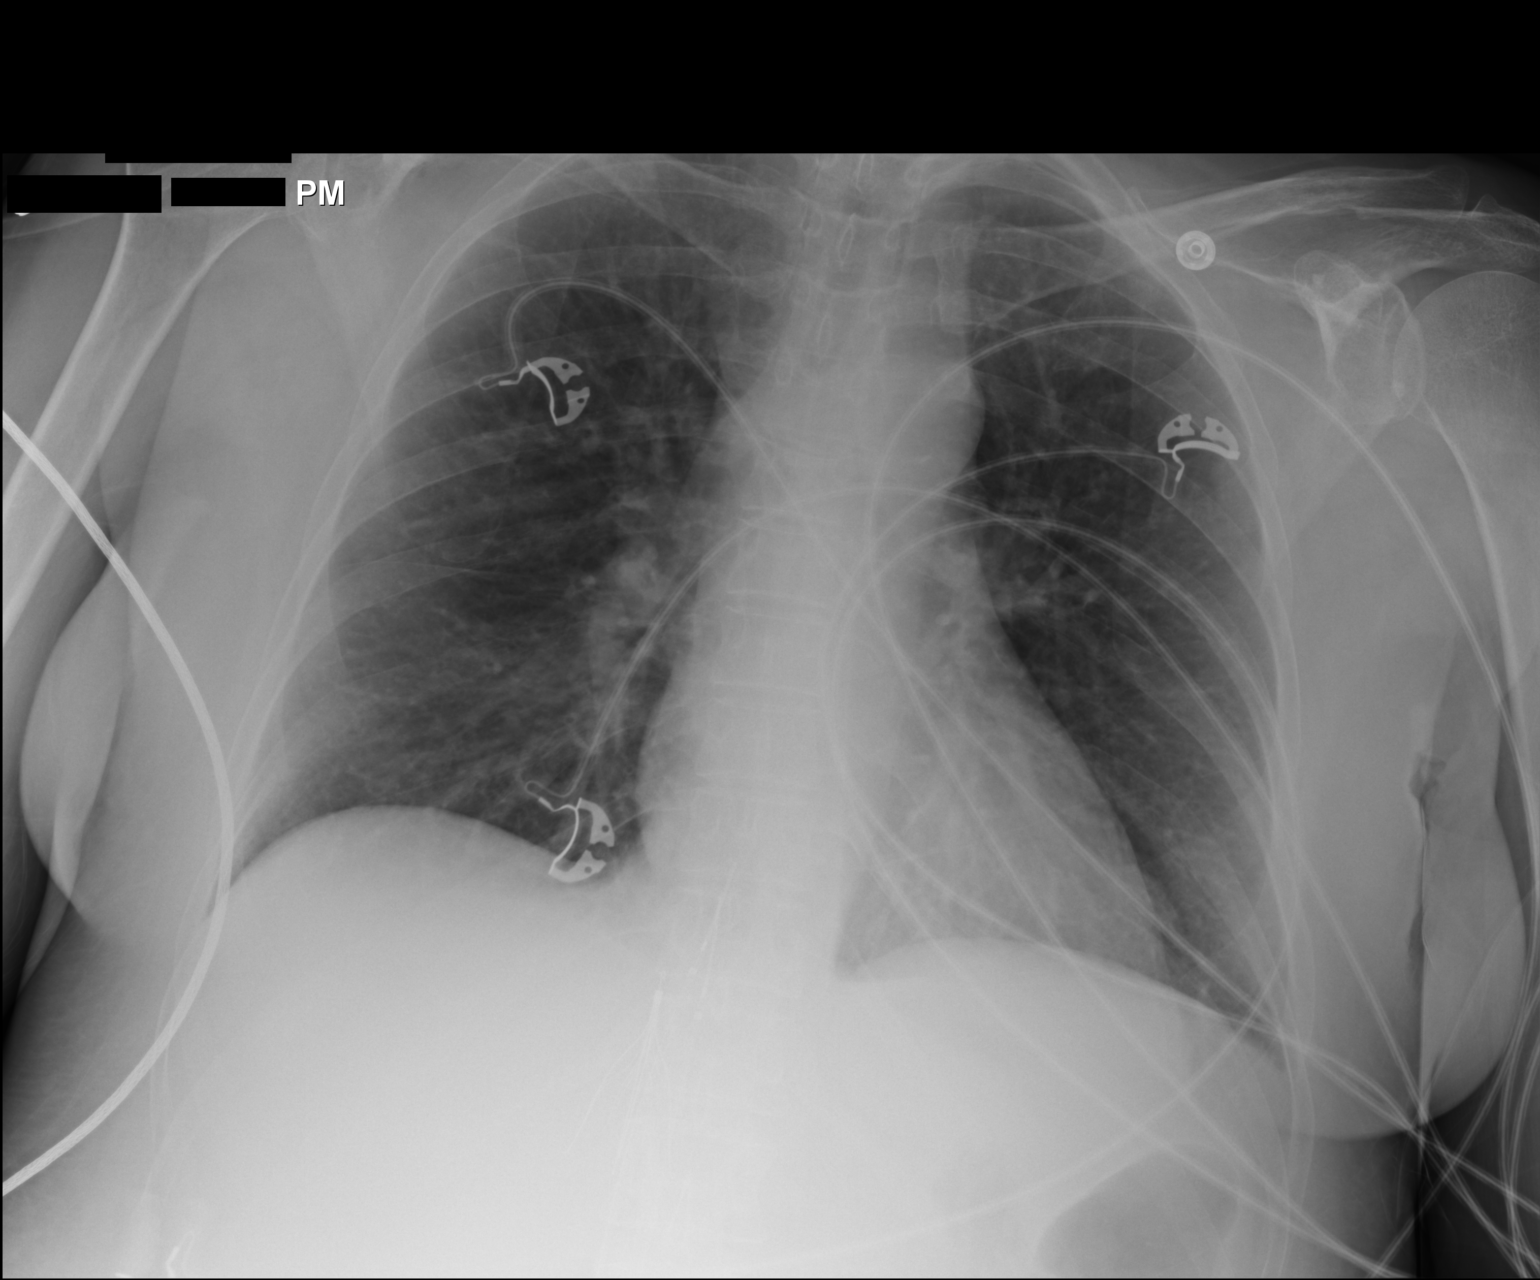

[1 of 1 positions shown; findings below may reference images not displayed]

FINDINGS: Borderline airway thickening may reflect bronchitis or
reactive airways disease.  There is a suggestion of minimal
peripheral subsegmental atelectasis in the left lower lobe.  Lungs
appear otherwise clear.  Heart size is within normal limits.
IMPRESSION: 1.  Potential subsegmental atelectasis peripherally at the left
lung base.  Minimal airway thickening.  Heart size is within normal
limits.

## 2013-12-12 ENCOUNTER — Telehealth: Payer: Self-pay | Admitting: Nurse Practitioner

## 2013-12-12 NOTE — Telephone Encounter (Signed)
appt made for tomorrow with bill

## 2013-12-13 ENCOUNTER — Encounter: Payer: Self-pay | Admitting: Family Medicine

## 2013-12-13 ENCOUNTER — Ambulatory Visit (INDEPENDENT_AMBULATORY_CARE_PROVIDER_SITE_OTHER): Payer: BC Managed Care – PPO | Admitting: General Practice

## 2013-12-13 VITALS — BP 156/85 | HR 107 | Temp 98.2°F | Ht 64.0 in | Wt 160.0 lb

## 2013-12-13 DIAGNOSIS — R309 Painful micturition, unspecified: Secondary | ICD-10-CM

## 2013-12-13 DIAGNOSIS — R300 Dysuria: Secondary | ICD-10-CM

## 2013-12-13 DIAGNOSIS — N390 Urinary tract infection, site not specified: Secondary | ICD-10-CM

## 2013-12-13 LAB — POCT URINALYSIS DIPSTICK
Bilirubin, UA: NEGATIVE
Glucose, UA: NEGATIVE
Ketones, UA: NEGATIVE
Nitrite, UA: NEGATIVE
Protein, UA: NEGATIVE
Spec Grav, UA: 1.01
Urobilinogen, UA: NEGATIVE
pH, UA: 6

## 2013-12-13 LAB — POCT UA - MICROSCOPIC ONLY
Bacteria, U Microscopic: NEGATIVE
Casts, Ur, LPF, POC: NEGATIVE
Crystals, Ur, HPF, POC: NEGATIVE
Mucus, UA: NEGATIVE
Yeast, UA: NEGATIVE

## 2013-12-13 MED ORDER — NITROFURANTOIN MONOHYD MACRO 100 MG PO CAPS: 100.0000 mg | ORAL_CAPSULE | Freq: Two times a day (BID) | ORAL | Status: DC

## 2013-12-13 NOTE — Patient Instructions (Signed)
Urinary Tract Infection  Urinary tract infections (UTIs) can develop anywhere along your urinary tract. Your urinary tract is your body's drainage system for removing wastes and extra water. Your urinary tract includes two kidneys, two ureters, a bladder, and a urethra. Your kidneys are a pair of bean-shaped organs. Each kidney is about the size of your fist. They are located below your ribs, one on each side of your spine.  CAUSES  Infections are caused by microbes, which are microscopic organisms, including fungi, viruses, and bacteria. These organisms are so small that they can only be seen through a microscope. Bacteria are the microbes that most commonly cause UTIs.  SYMPTOMS   Symptoms of UTIs may vary by age and gender of the patient and by the location of the infection. Symptoms in young women typically include a frequent and intense urge to urinate and a painful, burning feeling in the bladder or urethra during urination. Older women and men are more likely to be tired, shaky, and weak and have muscle aches and abdominal pain. A fever may mean the infection is in your kidneys. Other symptoms of a kidney infection include pain in your back or sides below the ribs, nausea, and vomiting.  DIAGNOSIS  To diagnose a UTI, your caregiver will ask you about your symptoms. Your caregiver also will ask to provide a urine sample. The urine sample will be tested for bacteria and white blood cells. White blood cells are made by your body to help fight infection.  TREATMENT   Typically, UTIs can be treated with medication. Because most UTIs are caused by a bacterial infection, they usually can be treated with the use of antibiotics. The choice of antibiotic and length of treatment depend on your symptoms and the type of bacteria causing your infection.  HOME CARE INSTRUCTIONS   If you were prescribed antibiotics, take them exactly as your caregiver instructs you. Finish the medication even if you feel better after you  have only taken some of the medication.   Drink enough water and fluids to keep your urine clear or pale yellow.   Avoid caffeine, tea, and carbonated beverages. They tend to irritate your bladder.   Empty your bladder often. Avoid holding urine for long periods of time.   Empty your bladder before and after sexual intercourse.   After a bowel movement, women should cleanse from front to back. Use each tissue only once.  SEEK MEDICAL CARE IF:    You have back pain.   You develop a fever.   Your symptoms do not begin to resolve within 3 days.  SEEK IMMEDIATE MEDICAL CARE IF:    You have severe back pain or lower abdominal pain.   You develop chills.   You have nausea or vomiting.   You have continued burning or discomfort with urination.  MAKE SURE YOU:    Understand these instructions.   Will watch your condition.   Will get help right away if you are not doing well or get worse.  Document Released: 05/14/2005 Document Revised: 02/03/2012 Document Reviewed: 09/12/2011  [employer] Patient Information 2014 [employer].

## 2013-12-13 NOTE — Progress Notes (Signed)
   Subjective:    Patient ID: [name] [name], female    DOB: 1956/03/12, 58 y.o.   MRN: [id number]  Cough This is a new problem. The current episode started 1 to 4 weeks ago. The problem has been unchanged. The cough is non-productive. Associated symptoms include postnasal drip. Pertinent negatives include no chest pain, chills, fever, headaches, shortness of breath or wheezing. The symptoms are aggravated by lying down. She has tried nothing for the symptoms. There is no history of asthma, bronchitis or pneumonia.  Dysuria  This is a new problem. The current episode started in the past 7 days. The problem occurs every urination. The problem has been unchanged. The quality of the pain is described as aching. There has been no fever. She is sexually active. There is no history of pyelonephritis. Associated symptoms include urgency. Pertinent negatives include no chills, flank pain or frequency. She has tried increased fluids for the symptoms. There is no history of recurrent UTIs.      Review of Systems  Constitutional: Negative for fever and chills.  HENT: Positive for postnasal drip.   Respiratory: Positive for cough. Negative for chest tightness, shortness of breath and wheezing.   Cardiovascular: Negative for chest pain and palpitations.  Genitourinary: Positive for dysuria and urgency. Negative for frequency and flank pain.  Neurological: Negative for dizziness, weakness and headaches.       Objective:   Physical Exam  Constitutional: She is oriented to person, place, and time. She appears well-developed and well-nourished.  HENT:  Head: Normocephalic and atraumatic.  Right Ear: External ear normal.  Left Ear: External ear normal.  Mouth/Throat: Oropharynx is clear and moist.  Pulmonary/Chest: Effort normal and breath sounds normal. No respiratory distress. She exhibits no tenderness.  Neurological: She is alert and oriented to person, place, and time.  Skin: Skin is warm and dry.    Psychiatric: She has a normal mood and affect.    Results for orders placed in visit on 12/13/13  POCT URINALYSIS DIPSTICK      Result Value Ref Range   Color, UA yellow     Clarity, UA clear     Glucose, UA negative     Bilirubin, UA negative     Ketones, UA negative     Spec Grav, UA 1.010     Blood, UA large     pH, UA 6.0     Protein, UA negative     Urobilinogen, UA negative     Nitrite, UA negative     Leukocytes, UA moderate (2+)    POCT UA - MICROSCOPIC ONLY      Result Value Ref Range   WBC, Ur, HPF, POC 15-20     RBC, urine, microscopic 5-10     Bacteria, U Microscopic negative     Mucus, UA negative     Epithelial cells, urine per micros occ     Crystals, Ur, HPF, POC negative     Casts, Ur, LPF, POC negative     Yeast, UA negative           Assessment & Plan:  1. Urinary pain  - POCT urinalysis dipstick - POCT UA - Microscopic Only  2. UTI (urinary tract infection) - nitrofurantoin, macrocrystal-monohydrate, (MACROBID) 100 MG capsule; Take 1 capsule (100 mg total) by mouth 2 (two) times daily.  Dispense: 20 capsule; Refill: 0 -Increase fluid intake Frequent voiding Proper perineal hygiene RTO prn Patient verbalized understanding Erby Pian, FNP-C

## 2014-01-19 ENCOUNTER — Ambulatory Visit: Payer: BC Managed Care – PPO

## 2014-01-19 ENCOUNTER — Other Ambulatory Visit: Payer: Self-pay | Admitting: Medical Oncology

## 2014-01-19 ENCOUNTER — Other Ambulatory Visit: Payer: BC Managed Care – PPO

## 2014-01-20 ENCOUNTER — Telehealth: Payer: Self-pay | Admitting: Internal Medicine

## 2014-01-20 NOTE — Telephone Encounter (Signed)
CALLED PT TO R/S 6/4 MISSED APPT. S/W PT, GAVE NEW APPT 7/9 @ 9AM (PT REQ THURSDAY APPTS ONLY).

## 2014-02-19 ENCOUNTER — Other Ambulatory Visit: Payer: Self-pay | Admitting: Nurse Practitioner

## 2014-02-23 ENCOUNTER — Ambulatory Visit: Payer: BC Managed Care – PPO

## 2014-02-23 ENCOUNTER — Other Ambulatory Visit: Payer: BC Managed Care – PPO

## 2014-03-30 ENCOUNTER — Other Ambulatory Visit: Payer: Self-pay | Admitting: Nurse Practitioner

## 2014-03-31 NOTE — Telephone Encounter (Signed)
Last seen 12/13/13  [name] [name]

## 2014-03-31 NOTE — Telephone Encounter (Signed)
Patient NTBS for follow up and lab work  

## 2014-04-04 ENCOUNTER — Telehealth: Payer: Self-pay | Admitting: Nurse Practitioner

## 2014-04-04 MED ORDER — RIVAROXABAN 20 MG PO TABS: ORAL_TABLET | ORAL | Status: DC

## 2014-04-04 NOTE — Telephone Encounter (Signed)
Please review and advise.

## 2014-04-04 NOTE — Telephone Encounter (Signed)
Sent 90 day supply rx to pharmacy

## 2014-06-06 ENCOUNTER — Other Ambulatory Visit: Payer: Self-pay | Admitting: Nurse Practitioner

## 2014-07-23 ENCOUNTER — Other Ambulatory Visit: Payer: Self-pay | Admitting: Nurse Practitioner

## 2014-07-26 ENCOUNTER — Other Ambulatory Visit: Payer: Self-pay | Admitting: Nurse Practitioner

## 2014-08-01 ENCOUNTER — Telehealth: Payer: Self-pay | Admitting: Nurse Practitioner

## 2014-08-09 NOTE — Telephone Encounter (Signed)
Script for xalretto was done on 07-23-14.  Please call if need anything further.

## 2014-08-29 ENCOUNTER — Other Ambulatory Visit: Payer: Self-pay | Admitting: Family Medicine

## 2014-09-01 ENCOUNTER — Other Ambulatory Visit: Payer: Self-pay | Admitting: Nurse Practitioner

## 2014-09-01 NOTE — Telephone Encounter (Signed)
no more refills without being seen  

## 2014-09-02 ENCOUNTER — Other Ambulatory Visit: Payer: Self-pay | Admitting: *Deleted

## 2014-09-02 MED ORDER — RIVAROXABAN 20 MG PO TABS: 20.0000 mg | ORAL_TABLET | Freq: Every day | ORAL | Status: DC

## 2014-09-02 NOTE — Telephone Encounter (Signed)
Ok to refill per [name], FNP and rx was sent into pharmacy. Patient also notified that she needs to be seen.

## 2014-09-03 ENCOUNTER — Other Ambulatory Visit: Payer: Self-pay | Admitting: Nurse Practitioner

## 2014-10-27 ENCOUNTER — Ambulatory Visit (INDEPENDENT_AMBULATORY_CARE_PROVIDER_SITE_OTHER): Payer: 59 | Admitting: Nurse Practitioner

## 2014-10-27 ENCOUNTER — Encounter: Payer: Self-pay | Admitting: Nurse Practitioner

## 2014-10-27 VITALS — BP 136/84 | HR 110 | Temp 97.7°F | Ht 64.0 in | Wt 165.0 lb

## 2014-10-27 DIAGNOSIS — E611 Iron deficiency: Secondary | ICD-10-CM | POA: Diagnosis not present

## 2014-10-27 DIAGNOSIS — I82409 Acute embolism and thrombosis of unspecified deep veins of unspecified lower extremity: Secondary | ICD-10-CM

## 2014-10-27 DIAGNOSIS — K219 Gastro-esophageal reflux disease without esophagitis: Secondary | ICD-10-CM | POA: Diagnosis not present

## 2014-10-27 DIAGNOSIS — I10 Essential (primary) hypertension: Secondary | ICD-10-CM | POA: Diagnosis not present

## 2014-10-27 DIAGNOSIS — E785 Hyperlipidemia, unspecified: Secondary | ICD-10-CM

## 2014-10-27 MED ORDER — ANTARA 90 MG PO CAPS: 90.0000 mg | ORAL_CAPSULE | Freq: Every day | ORAL | Status: DC

## 2014-10-27 MED ORDER — PANTOPRAZOLE SODIUM 40 MG PO TBEC: DELAYED_RELEASE_TABLET | ORAL | Status: DC

## 2014-10-27 MED ORDER — RIVAROXABAN 20 MG PO TABS: 20.0000 mg | ORAL_TABLET | Freq: Every day | ORAL | Status: DC

## 2014-10-27 MED ORDER — LISINOPRIL 20 MG PO TABS: ORAL_TABLET | ORAL | Status: DC

## 2014-10-27 MED ORDER — ATORVASTATIN CALCIUM 40 MG PO TABS: 40.0000 mg | ORAL_TABLET | Freq: Every day | ORAL | Status: DC

## 2014-10-27 NOTE — Progress Notes (Signed)
Subjective:    Patient ID: [name] [name], female    DOB: 03-30-1956, 59 y.o.   MRN: [id number]   Patient here today for follow up of chronic medical problems.   Hypertension This is a chronic problem. The current episode started more than 1 year ago. The problem is unchanged. The problem is uncontrolled. Pertinent negatives include no chest pain, headaches, malaise/fatigue, palpitations, shortness of breath or sweats. Risk factors for coronary artery disease include post-menopausal state. Past treatments include ACE inhibitors. The current treatment provides moderate improvement. Compliance problems include diet and exercise.  There is no history of CAD/MI, CVA or PVD.  Hyperlipidemia This is a chronic problem. The current episode started more than 1 year ago. The problem is uncontrolled. Recent lipid tests were reviewed and are variable. Pertinent negatives include no chest pain or shortness of breath. Current antihyperlipidemic treatment includes statins and fibric acid derivatives. The current treatment provides moderate improvement of lipids. Compliance problems include adherence to diet and adherence to exercise.  Risk factors for coronary artery disease include dyslipidemia, hypertension and post-menopausal.  GERD Has been out of protonix and has had several flare ups in the last 3 weeks. DVT xeralto - no side effects- no bleeding      Review of Systems  Constitutional: Negative for malaise/fatigue.  Respiratory: Negative for shortness of breath.   Cardiovascular: Negative for chest pain and palpitations.  Neurological: Negative for headaches.       Objective:   Physical Exam  Constitutional: She is oriented to person, place, and time. She appears well-developed and well-nourished.  HENT:  Nose: Nose normal.  Mouth/Throat: Oropharynx is clear and moist.  Eyes: EOM are normal.  Neck: Trachea normal, normal range of motion and full passive range of motion without pain. Neck  supple. No JVD present. Carotid bruit is not present. No thyromegaly present.  Cardiovascular: Normal rate, regular rhythm, normal heart sounds and intact distal pulses.  Exam reveals no gallop and no friction rub.   No murmur heard. Pulmonary/Chest: Effort normal and breath sounds normal.  Abdominal: Soft. Bowel sounds are normal. She exhibits no distension and no mass. There is no tenderness.  Musculoskeletal: Normal range of motion.  Lymphadenopathy:    She has no cervical adenopathy.  Neurological: She is alert and oriented to person, place, and time. She has normal reflexes.  Skin: Skin is warm and dry.  Psychiatric: She has a normal mood and affect. Her behavior is normal. Judgment and thought content normal.    BP 136/84 mmHg  Pulse 110  Temp(Src) 97.7 F (36.5 C) (Oral)  Ht 5' 4"  (1.626 m)  Wt 165 lb (74.844 kg)  BMI 28.31 kg/m2       Assessment & Plan:  1. Iron deficiency Daily iro supplement - Anemia Profile B  2. Essential hypertension Do nit add salt to diet - lisinopril (PRINIVIL,ZESTRIL) 20 MG tablet; TAKE 1 TABLET (20 MG TOTAL) BY MOUTH DAILY.  Dispense: 90 tablet; Refill: 1 - CMP14+EGFR  3. DVT, recurrent, lower extremity, acute, unspecified laterality - rivaroxaban (XARELTO) 20 MG TABS tablet; Take 1 tablet (20 mg total) by mouth daily.  Dispense: 90 tablet; Refill: 1  4. Gastroesophageal reflux disease without esophagitis Avoid spicy foods - pantoprazole (PROTONIX) 40 MG tablet; TAKE 1 TABLET (40 MG TOTAL) BY MOUTH DAILY.  Dispense: 90 tablet; Refill: 1  5. Hyperlipidemia with target LDL less than 100 Low fat  - ANTARA 90 MG CAPS; Take 90 mg by mouth daily.  Dispense:  30 capsule; Refill: 5 - atorvastatin (LIPITOR) 40 MG tablet; Take 1 tablet (40 mg total) by mouth daily.  Dispense: 90 tablet; Refill: 1 - NMR, lipoprofile    Labs pending Health maintenance reviewed Diet and exercise encouraged Continue all meds Follow up  In 6 month    [place], FNP

## 2014-10-27 NOTE — Patient Instructions (Signed)
Exercise to Stay Healthy Exercise helps you become and stay healthy. EXERCISE IDEAS AND TIPS Choose exercises that:  You enjoy.  Fit into your day. You do not need to exercise really hard to be healthy. You can do exercises at a slow or medium level and stay healthy. You can:  Stretch before and after working out.  Try yoga, Pilates, or tai chi.  Lift weights.  Walk fast, swim, jog, run, climb stairs, bicycle, dance, or rollerskate.  Take aerobic classes. Exercises that burn about 150 calories:  Running 1  miles in 15 minutes.  Playing volleyball for 45 to 60 minutes.  Washing and waxing a car for 45 to 60 minutes.  Playing touch football for 45 minutes.  Walking 1  miles in 35 minutes.  Pushing a stroller 1  miles in 30 minutes.  Playing basketball for 30 minutes.  Raking leaves for 30 minutes.  Bicycling 5 miles in 30 minutes.  Walking 2 miles in 30 minutes.  Dancing for 30 minutes.  Shoveling snow for 15 minutes.  Swimming laps for 20 minutes.  Walking up stairs for 15 minutes.  Bicycling 4 miles in 15 minutes.  Gardening for 30 to 45 minutes.  Jumping rope for 15 minutes.  Washing windows or floors for 45 to 60 minutes. Document Released: 09/06/2010 Document Revised: 10/27/2011 Document Reviewed: 09/06/2010 Exit[employer] Patient Information 2015 [employer]. This information is not intended to replace advice given to you by your health care provider. Make sure you discuss any questions you have with your health care provider.  

## 2014-10-28 LAB — ANEMIA PROFILE B
Basophils Absolute: 0.1 10*3/uL (ref 0.0–0.2)
Basos: 1 %
Eos: 2 %
Eosinophils Absolute: 0.2 10*3/uL (ref 0.0–0.4)
Ferritin: 8 ng/mL — ABNORMAL LOW (ref 15–150)
Folate: 15.1 ng/mL (ref >3.0)
HCT: 29.5 % — ABNORMAL LOW (ref 34.0–46.6)
Hemoglobin: 9.3 g/dL — ABNORMAL LOW (ref 11.1–15.9)
Immature Grans (Abs): 0 10*3/uL (ref 0.0–0.1)
Immature Granulocytes: 0 %
Iron Saturation: 6 % — CL (ref 15–55)
Iron: 22 ug/dL — ABNORMAL LOW (ref 27–159)
Lymphocytes Absolute: 3.4 10*3/uL — ABNORMAL HIGH (ref 0.7–3.1)
Lymphs: 32 %
MCH: 24.5 pg — ABNORMAL LOW (ref 26.6–33.0)
MCHC: 31.5 g/dL (ref 31.5–35.7)
MCV: 78 fL — ABNORMAL LOW (ref 79–97)
Monocytes Absolute: 0.8 10*3/uL (ref 0.1–0.9)
Monocytes: 8 %
Neutrophils Absolute: 6.2 10*3/uL (ref 1.4–7.0)
Neutrophils Relative %: 57 %
Platelets: 522 10*3/uL — ABNORMAL HIGH (ref 150–379)
RBC: 3.79 x10E6/uL (ref 3.77–5.28)
RDW: 17.3 % — ABNORMAL HIGH (ref 12.3–15.4)
Retic Ct Pct: 1.3 % (ref 0.6–2.6)
Total Iron Binding Capacity: 353 ug/dL (ref 250–450)
UIBC: 331 ug/dL (ref 131–425)
Vitamin B-12: 217 pg/mL (ref 211–946)
WBC: 10.7 10*3/uL (ref 3.4–10.8)

## 2014-10-28 LAB — CMP14+EGFR
ALT: 8 IU/L (ref 0–32)
AST: 12 IU/L (ref 0–40)
Albumin/Globulin Ratio: 1.5 (ref 1.1–2.5)
Albumin: 4.4 g/dL (ref 3.5–5.5)
Alkaline Phosphatase: 108 IU/L (ref 39–117)
BUN/Creatinine Ratio: 10 (ref 9–23)
BUN: 6 mg/dL (ref 6–24)
Bilirubin Total: <0.2 mg/dL (ref 0.0–1.2)
CO2: 21 mmol/L (ref 18–29)
Calcium: 9.8 mg/dL (ref 8.7–10.2)
Chloride: 100 mmol/L (ref 97–108)
Creatinine, Ser: 0.59 mg/dL (ref 0.57–1.00)
GFR calc Af Amer: 116 mL/min/{1.73_m2} (ref >59)
GFR calc non Af Amer: 101 mL/min/{1.73_m2} (ref >59)
Globulin, Total: 2.9 g/dL (ref 1.5–4.5)
Glucose: 95 mg/dL (ref 65–99)
Potassium: 4.4 mmol/L (ref 3.5–5.2)
Sodium: 139 mmol/L (ref 134–144)
Total Protein: 7.3 g/dL (ref 6.0–8.5)

## 2014-10-28 LAB — NMR, LIPOPROFILE
Cholesterol: 348 mg/dL — ABNORMAL HIGH (ref 100–199)
HDL Cholesterol by NMR: 29 mg/dL — ABNORMAL LOW (ref >39)
HDL Particle Number: 26.5 umol/L — ABNORMAL LOW (ref >=30.5)
LDL Particle Number: 2308 nmol/L — ABNORMAL HIGH (ref <1000)
LDL Size: 19.7 nm (ref >20.5)
LP-IR Score: 91 — ABNORMAL HIGH (ref <=45)
Small LDL Particle Number: 1613 nmol/L — ABNORMAL HIGH (ref <=527)
Triglycerides by NMR: 681 mg/dL (ref 0–149)

## 2014-11-06 ENCOUNTER — Telehealth: Payer: Self-pay | Admitting: Nurse Practitioner

## 2014-11-06 NOTE — Telephone Encounter (Signed)
Pt aware of lab results 

## 2014-12-19 ENCOUNTER — Telehealth: Payer: Self-pay | Admitting: Nurse Practitioner

## 2014-12-19 DIAGNOSIS — I10 Essential (primary) hypertension: Secondary | ICD-10-CM

## 2014-12-19 MED ORDER — LISINOPRIL 20 MG PO TABS: ORAL_TABLET | ORAL | Status: DC

## 2014-12-19 NOTE — Telephone Encounter (Signed)
Lisinopril rx sent to pharmacy Stop xeralto 24 hours prior to procedure and restart 2 days after procedure Should not need antibiotic

## 2014-12-20 MED ORDER — AMOXICILLIN 875 MG PO TABS: 875.0000 mg | ORAL_TABLET | Freq: Two times a day (BID) | ORAL | Status: DC

## 2014-12-20 NOTE — Telephone Encounter (Signed)
Patient aware. She states that her dentist recommend that she take antibiotic since she has stents and told her to call her pcp for this

## 2014-12-20 NOTE — Telephone Encounter (Signed)
Antibiotic sent to pharmacy.  

## 2014-12-21 NOTE — Telephone Encounter (Signed)
Patient aware.

## 2015-02-12 ENCOUNTER — Telehealth: Payer: Self-pay | Admitting: Nurse Practitioner

## 2015-02-12 DIAGNOSIS — I739 Peripheral vascular disease, unspecified: Secondary | ICD-10-CM

## 2015-02-13 NOTE — Telephone Encounter (Signed)
Referral made- is [place] a vascular surgeon or heart surgeon- If heart surgeon taht is not who she needs to see. Needs to see peripheral vascular surgeon.

## 2015-02-13 NOTE — Telephone Encounter (Signed)
Patient aware of referral for vascular surgeon.

## 2015-02-14 ENCOUNTER — Telehealth: Payer: Self-pay | Admitting: Nurse Practitioner

## 2015-02-20 ENCOUNTER — Other Ambulatory Visit: Payer: Self-pay | Admitting: Nurse Practitioner

## 2015-02-20 DIAGNOSIS — I739 Peripheral vascular disease, unspecified: Secondary | ICD-10-CM

## 2015-03-13 ENCOUNTER — Other Ambulatory Visit (HOSPITAL_COMMUNITY): Payer: Self-pay | Admitting: Interventional Radiology

## 2015-03-13 ENCOUNTER — Ambulatory Visit
Admission: RE | Admit: 2015-03-13 | Discharge: 2015-03-13 | Disposition: A | Discharge status: Home / Self Care | Payer: 59 | Source: Ambulatory Visit | Attending: Nurse Practitioner | Admitting: Nurse Practitioner

## 2015-03-13 DIAGNOSIS — I82402 Acute embolism and thrombosis of unspecified deep veins of left lower extremity: Secondary | ICD-10-CM

## 2015-03-13 DIAGNOSIS — I739 Peripheral vascular disease, unspecified: Secondary | ICD-10-CM

## 2015-03-13 DIAGNOSIS — R600 Localized edema: Secondary | ICD-10-CM | POA: Insufficient documentation

## 2015-03-13 DIAGNOSIS — Z86718 Personal history of other venous thrombosis and embolism: Secondary | ICD-10-CM | POA: Insufficient documentation

## 2015-03-13 NOTE — Progress Notes (Signed)
Chief Complaint  Patient presents with  . PVD    HS DVT/IVC thrombosis/stenting of IVC & bil iliac veins     Referring Physician(s): [name] [name]  History of Present Illness: [name] [name] is a 59 y.o. female referred back by [name] Collum, FNP for evaluation of left lower extremity edema. She is status post previous transcatheter thrombolytic therapy for massive iliofemoral DVT bilaterally as well as extensive IVC thrombosis. After thrombolytic therapy, bilateral iliac veins and the lower IVC were reconstructed with placement of intravascular stents. The patient did quite well afterwards and is now on chronic Xarelto.   [name] [name] complains of more recent left lower leg edema which was uncomfortable and also cause some numbness of the left foot and toes. Edema has improved over the last few weeks after use of compression stockings. She does have some more prominent spider veins in her left foot and ankle regions as well as one protruding superficial vein in the medial calf. She is able to ambulate without difficulty and currently does not have any significant pain of the extremity.  Past Medical History  Diagnosis Date  . DVT (deep venous thrombosis)   . Pulmonary embolism   . Back pain     Past Surgical History  Procedure Laterality Date  . Back surgery    . Colonoscopy  05/19/2012    Procedure: COLONOSCOPY;  Surgeon: [name] Beams, MD;  Location: [facility] ENDOSCOPY;  Service: Endoscopy;  Laterality: N/A;  . Esophagogastroduodenoscopy  05/19/2012    Procedure: ESOPHAGOGASTRODUODENOSCOPY (EGD);  Surgeon: [name] Beams, MD;  Location: [facility] ENDOSCOPY;  Service: Endoscopy;  Laterality: N/A;    Allergies: Prednisone and Tramadol  Medications: Prior to Admission medications   Medication Sig Start Date End Date Taking? Authorizing Provider  acetaminophen (TYLENOL) 500 MG tablet Take 500 mg by mouth every 6 (six) hours as needed. For pain   Yes Historical Provider, MD  lisinopril  (PRINIVIL,ZESTRIL) 20 MG tablet TAKE 1 TABLET (20 MG TOTAL) BY MOUTH DAILY. 12/19/14  Yes [name] Hassell Done, FNP  pantoprazole (PROTONIX) 40 MG tablet TAKE 1 TABLET (40 MG TOTAL) BY MOUTH DAILY. 10/27/14  Yes [name] Hassell Done, FNP  rivaroxaban (XARELTO) 20 MG TABS tablet Take 1 tablet (20 mg total) by mouth daily. 10/27/14  Yes [name] Hassell Done, FNP  amoxicillin (AMOXIL) 875 MG tablet Take 1 tablet (875 mg total) by mouth 2 (two) times daily. Day before and day after dental surgery Patient not taking: Reported on 03/13/2015 12/20/14   [name] Hassell Done, FNP  ANTARA 90 MG CAPS Take 90 mg by mouth daily. Patient not taking: Reported on 03/13/2015 10/27/14   [name] Hassell Done, FNP  atorvastatin (LIPITOR) 40 MG tablet Take 1 tablet (40 mg total) by mouth daily. Patient not taking: Reported on 03/13/2015 10/27/14   [name] Hassell Done, FNP     Family History  Problem Relation Age of Onset  . Leukemia Mother   . Prostate cancer Father     History   Social History  . Marital Status: Married    Spouse Name: N/A  . Number of Children: N/A  . Years of Education: N/A   Social History Main Topics  . Smoking status: Current Every Day Smoker -- 0.50 packs/day for 29 years    Types: Cigarettes  . Smokeless tobacco: Never Used  . Alcohol Use: Yes     Comment: Occasional  . Drug Use: No  . Sexual Activity: Not on file   Other Topics Concern  . Not on file  Social History Narrative    Review of Systems: A 12 point ROS discussed and pertinent positives are indicated in the HPI above.  All other systems are negative.  Review of Systems  Constitutional: Negative.   Respiratory: Negative.   Cardiovascular: Negative.   Gastrointestinal: Negative.   Genitourinary: Negative.   Musculoskeletal: Negative for myalgias, back pain, joint swelling, arthralgias and gait problem.       Edema of left lower extremity.  Neurological: Negative.     Vital Signs: BP 153/72 mmHg  Pulse 105   Temp(Src) 98.1 F (36.7 C) (Oral)  Resp 14  Ht 5\' 4"  (1.626 m)  Wt 150 lb (68.04 kg)  BMI 25.73 kg/m2  SpO2 98%  Physical Exam  Constitutional: She appears well-developed and well-nourished. No distress.  Musculoskeletal:  Left lower extremity: Mild edema of the ankle and foot. Telangiectasias of the medial ankle and dorsum of the foot. Focal palpable superficial vein/varix of the medial mid calf. No discoloration or ulceration. No evidence of phlegmasia. Normal palpable distal arterial pulses at the level of the posterior tibial and dorsalis pedis arteries.  Skin: She is not diaphoretic.  Nursing note and vitals reviewed.   Imaging: No results found.  Labs:  CBC:  Recent Labs  10/27/14 1155  WBC 10.7  HGB 9.3*  HCT 29.5*  PLT 522*    COAGS: No results for input(s): INR, APTT in the last 8760 hours.  BMP:  Recent Labs  10/27/14 1155  NA 139  K 4.4  CL 100  CO2 21  GLUCOSE 95  BUN 6  CALCIUM 9.8  CREATININE 0.59  GFRNONAA 101  GFRAA 116    LIVER FUNCTION TESTS:  Recent Labs  10/27/14 1155  BILITOT <0.2  AST 12  ALT 8  ALKPHOS 108  PROT 7.3    TUMOR MARKERS: No results for input(s): AFPTM, CEA, CA199, CHROMGRNA in the last 8760 hours.  Assessment and Plan:  By exam, the patient may have some mild superficial venous insufficiency of the left lower extremity. She will need a duplex ultrasound of the lower extremity to exclude DVT given her history. This could not be added on today and will be performed on 03/15/2015. I will call the patient with results from that study. I recommended continued use of a compression garment on the left lower leg as needed. I also encouraged her to walk regularly as much as possible to help with edema and watch dietary sodium intake.   SignedAletta Edouard [name] 03/13/2015, 2:56 PM     I spent a total of 15 Minutes in face to face in clinical consultation, greater than 50% of which was counseling/coordinating care  for left lower extremity edema.

## 2015-03-15 ENCOUNTER — Ambulatory Visit
Admission: RE | Admit: 2015-03-15 | Discharge: 2015-03-15 | Disposition: A | Discharge status: Home / Self Care | Payer: 59 | Source: Ambulatory Visit | Attending: Interventional Radiology | Admitting: Interventional Radiology

## 2015-03-15 DIAGNOSIS — I82402 Acute embolism and thrombosis of unspecified deep veins of left lower extremity: Secondary | ICD-10-CM

## 2015-05-18 ENCOUNTER — Other Ambulatory Visit: Payer: Self-pay | Admitting: Nurse Practitioner

## 2015-05-20 NOTE — Telephone Encounter (Signed)
Last seen 10/27/14

## 2015-06-04 ENCOUNTER — Other Ambulatory Visit: Payer: Self-pay | Admitting: Nurse Practitioner

## 2015-06-04 NOTE — Telephone Encounter (Signed)
Patient aware she will need to be seen  

## 2015-06-04 NOTE — Telephone Encounter (Signed)
Last seen 11/04/13 

## 2015-06-04 NOTE — Telephone Encounter (Signed)
Who has ben seeing patient she has not been seen since 2015

## 2015-06-04 NOTE — Telephone Encounter (Signed)
Excuse me was seen 10/27/14- ntbs for follow up

## 2015-06-06 ENCOUNTER — Telehealth: Payer: Self-pay | Admitting: Nurse Practitioner

## 2015-06-21 ENCOUNTER — Ambulatory Visit (INDEPENDENT_AMBULATORY_CARE_PROVIDER_SITE_OTHER): Payer: 59 | Admitting: Family Medicine

## 2015-06-21 ENCOUNTER — Encounter: Payer: Self-pay | Admitting: Family Medicine

## 2015-06-21 VITALS — BP 167/84 | HR 115 | Temp 98.3°F | Ht 64.0 in | Wt 163.2 lb

## 2015-06-21 DIAGNOSIS — N3001 Acute cystitis with hematuria: Secondary | ICD-10-CM | POA: Diagnosis not present

## 2015-06-21 DIAGNOSIS — I10 Essential (primary) hypertension: Secondary | ICD-10-CM

## 2015-06-21 DIAGNOSIS — E785 Hyperlipidemia, unspecified: Secondary | ICD-10-CM

## 2015-06-21 DIAGNOSIS — R82998 Other abnormal findings in urine: Secondary | ICD-10-CM

## 2015-06-21 DIAGNOSIS — Z0000 Encounter for general adult medical examination without abnormal findings: Secondary | ICD-10-CM

## 2015-06-21 DIAGNOSIS — Z23 Encounter for immunization: Secondary | ICD-10-CM

## 2015-06-21 DIAGNOSIS — R8299 Other abnormal findings in urine: Secondary | ICD-10-CM | POA: Diagnosis not present

## 2015-06-21 DIAGNOSIS — E7849 Other hyperlipidemia: Secondary | ICD-10-CM | POA: Insufficient documentation

## 2015-06-21 DIAGNOSIS — N390 Urinary tract infection, site not specified: Secondary | ICD-10-CM | POA: Insufficient documentation

## 2015-06-21 LAB — POCT URINALYSIS DIPSTICK
Bilirubin, UA: NEGATIVE
Glucose, UA: NEGATIVE
Ketones, UA: NEGATIVE
Nitrite, UA: POSITIVE
Protein, UA: NEGATIVE
Spec Grav, UA: 1.01
Urobilinogen, UA: NEGATIVE
pH, UA: 6.5

## 2015-06-21 LAB — POCT UA - MICROSCOPIC ONLY
Casts, Ur, LPF, POC: NEGATIVE
Mucus, UA: NEGATIVE
Yeast, UA: NEGATIVE

## 2015-06-21 MED ORDER — LISINOPRIL 20 MG PO TABS: ORAL_TABLET | ORAL | Status: DC

## 2015-06-21 MED ORDER — PANTOPRAZOLE SODIUM 40 MG PO TBEC: 40.0000 mg | DELAYED_RELEASE_TABLET | Freq: Every day | ORAL | Status: DC

## 2015-06-21 MED ORDER — RIVAROXABAN 20 MG PO TABS: 20.0000 mg | ORAL_TABLET | Freq: Every day | ORAL | Status: DC

## 2015-06-21 MED ORDER — NITROFURANTOIN MONOHYD MACRO 100 MG PO CAPS
100.0000 mg | ORAL_CAPSULE | Freq: Two times a day (BID) | ORAL | Status: DC
Start: 2015-06-21 — End: 2015-12-03

## 2015-06-21 NOTE — Patient Instructions (Addendum)
Great to meet you!  Come back for a high blood pressure follow up in 1-2 months  Urinary Tract Infection Urinary tract infections (UTIs) can develop anywhere along your urinary tract. Your urinary tract is your body's drainage system for removing wastes and extra water. Your urinary tract includes two kidneys, two ureters, a bladder, and a urethra. Your kidneys are a pair of bean-shaped organs. Each kidney is about the size of your fist. They are located below your ribs, one on each side of your spine. CAUSES Infections are caused by microbes, which are microscopic organisms, including fungi, viruses, and bacteria. These organisms are so small that they can only be seen through a microscope. Bacteria are the microbes that most commonly cause UTIs. SYMPTOMS  Symptoms of UTIs may vary by age and gender of the patient and by the location of the infection. Symptoms in young women typically include a frequent and intense urge to urinate and a painful, burning feeling in the bladder or urethra during urination. Older women and men are more likely to be tired, shaky, and weak and have muscle aches and abdominal pain. A fever may mean the infection is in your kidneys. Other symptoms of a kidney infection include pain in your back or sides below the ribs, nausea, and vomiting. DIAGNOSIS To diagnose a UTI, your caregiver will ask you about your symptoms. Your caregiver will also ask you to provide a urine sample. The urine sample will be tested for bacteria and white blood cells. White blood cells are made by your body to help fight infection. TREATMENT  Typically, UTIs can be treated with medication. Because most UTIs are caused by a bacterial infection, they usually can be treated with the use of antibiotics. The choice of antibiotic and length of treatment depend on your symptoms and the type of bacteria causing your infection. HOME CARE INSTRUCTIONS  If you were prescribed antibiotics, take them exactly as  your caregiver instructs you. Finish the medication even if you feel better after you have only taken some of the medication.  Drink enough water and fluids to keep your urine clear or pale yellow.  Avoid caffeine, tea, and carbonated beverages. They tend to irritate your bladder.  Empty your bladder often. Avoid holding urine for long periods of time.  Empty your bladder before and after sexual intercourse.  After a bowel movement, women should cleanse from front to back. Use each tissue only once. SEEK MEDICAL CARE IF:   You have back pain.  You develop a fever.  Your symptoms do not begin to resolve within 3 days. SEEK IMMEDIATE MEDICAL CARE IF:   You have severe back pain or lower abdominal pain.  You develop chills.  You have nausea or vomiting.  You have continued burning or discomfort with urination. MAKE SURE YOU:   Understand these instructions.  Will watch your condition.  Will get help right away if you are not doing well or get worse.   This information is not intended to replace advice given to you by your health care provider. Make sure you discuss any questions you have with your health care provider.   Document Released: 05/14/2005 Document Revised: 04/25/2015 Document Reviewed: 09/12/2011 Elsevier Interactive Patient Education [employer].

## 2015-06-21 NOTE — Progress Notes (Signed)
   HPI  Patient presents today here for concerns of UTI and medication refill.  Patient's when she's had dark urineand dysuria 1 week. She denies fever, chills, by mouth intolerance, back pain, orabdominal pain.  She's only had 2 UTIs in her entire life.  tachycardia She states that it's generally up, at home its as low as 90s on occasion. She denies chest pain, dyspnea, and palps She states that her family has high HR and it doesn't bother anyone else.   HLD She is not taking lipitor She thinks her previous provider over-reacted because "she didn't know anything about my diet and no one my family has high cholseterol" She doesn't seem to be watching her diet now  PMH: Smoking status noted ROS: Per HPI  Objective: BP 167/84 mmHg  Pulse 115  Temp(Src) 98.3 F (36.8 C) (Oral)  Ht $R'5\' 4"'cT$  (1.626 m)  Wt 163 lb 3.2 oz (74.027 kg)  BMI 28.00 kg/m2 Gen: NAD, alert, cooperative with exam HEENT: NCAT CV: RRR, good S1/S2, no murmur Resp: CTABL, no wheezes, non-labored Abd: SNTND, BS present, no guarding or organomegaly, no cva tenderness, no suprapubic tenderness Ext: No edema, warm Neuro: Alert and oriented, No gross deficits  Assessment and plan:  # UTI Treat with macrobid, Tachycardia chronic- no concern for SIRS syndrome  # Tachycardia Recommended medical management, offered BB, She declines I then offered cards referral, she declines F/u 1-2 months Thyroid labs added to annual labs  HCM Flu shot Fasting labs ordered F/u 1-2 months  HLD Discussed, layed ground work for discussion about Statins  Orders Placed This Encounter  Procedures  . Urine culture  . Flu Vaccine QUAD 36+ mos IM  . Lipid panel    Standing Status: Future     Number of Occurrences:      Standing Expiration Date: 06/20/2016  . CMP14+EGFR    Standing Status: Future     Number of Occurrences:      Standing Expiration Date: 06/20/2016  . CBC    Standing Status: Future     Number of  Occurrences:      Standing Expiration Date: 06/20/2016  . TSH    Standing Status: Future     Number of Occurrences:      Standing Expiration Date: 06/20/2016  . T4, Free    Standing Status: Future     Number of Occurrences:      Standing Expiration Date: 06/20/2016  . T3, Free    Standing Status: Future     Number of Occurrences:      Standing Expiration Date: 06/20/2016  . POCT UA - Microscopic Only  . POCT urinalysis dipstick    Meds ordered this encounter  Medications  . nitrofurantoin, macrocrystal-monohydrate, (MACROBID) 100 MG capsule    Sig: Take 1 capsule (100 mg total) by mouth 2 (two) times daily. 1 po BId    Dispense:  14 capsule    Refill:  0    Laroy Apple, MD [facility] 06/21/2015, 5:14 PM

## 2015-06-23 LAB — URINE CULTURE

## 2015-06-27 ENCOUNTER — Other Ambulatory Visit: Payer: 59

## 2015-09-21 ENCOUNTER — Other Ambulatory Visit: Payer: Self-pay | Admitting: Family Medicine

## 2015-10-02 ENCOUNTER — Telehealth: Payer: Self-pay | Admitting: Nurse Practitioner

## 2015-10-25 ENCOUNTER — Other Ambulatory Visit: Payer: Self-pay | Admitting: *Deleted

## 2015-10-25 DIAGNOSIS — I10 Essential (primary) hypertension: Secondary | ICD-10-CM

## 2015-10-25 MED ORDER — LISINOPRIL 20 MG PO TABS: ORAL_TABLET | ORAL | Status: DC

## 2015-10-29 ENCOUNTER — Telehealth: Payer: Self-pay | Admitting: Nurse Practitioner

## 2015-10-29 DIAGNOSIS — I10 Essential (primary) hypertension: Secondary | ICD-10-CM

## 2015-10-29 MED ORDER — LISINOPRIL 20 MG PO TABS: ORAL_TABLET | ORAL | Status: DC

## 2015-10-29 NOTE — Telephone Encounter (Signed)
done

## 2015-11-01 ENCOUNTER — Other Ambulatory Visit: Payer: Self-pay | Admitting: *Deleted

## 2015-11-01 MED ORDER — RIVAROXABAN 20 MG PO TABS: 20.0000 mg | ORAL_TABLET | Freq: Every day | ORAL | Status: DC

## 2015-11-02 ENCOUNTER — Ambulatory Visit: Payer: Self-pay | Admitting: Family Medicine

## 2015-11-21 ENCOUNTER — Other Ambulatory Visit: Payer: Self-pay

## 2015-11-21 MED ORDER — PANTOPRAZOLE SODIUM 40 MG PO TBEC: DELAYED_RELEASE_TABLET | ORAL | Status: DC

## 2015-12-03 ENCOUNTER — Encounter: Payer: Self-pay | Admitting: Family Medicine

## 2015-12-03 ENCOUNTER — Ambulatory Visit (INDEPENDENT_AMBULATORY_CARE_PROVIDER_SITE_OTHER): Payer: BLUE CROSS/BLUE SHIELD | Admitting: Family Medicine

## 2015-12-03 VITALS — BP 187/90 | HR 101 | Temp 98.0°F | Ht 64.0 in | Wt 162.2 lb

## 2015-12-03 DIAGNOSIS — R300 Dysuria: Secondary | ICD-10-CM

## 2015-12-03 DIAGNOSIS — N3001 Acute cystitis with hematuria: Secondary | ICD-10-CM | POA: Diagnosis not present

## 2015-12-03 LAB — URINALYSIS, COMPLETE
Bilirubin, UA: NEGATIVE
Glucose, UA: NEGATIVE
Ketones, UA: NEGATIVE
Nitrite, UA: NEGATIVE
Protein, UA: NEGATIVE
Specific Gravity, UA: <1.005 — ABNORMAL LOW (ref 1.005–1.030)
Urobilinogen, Ur: 0.2 mg/dL (ref 0.2–1.0)
pH, UA: 6 (ref 5.0–7.5)

## 2015-12-03 LAB — MICROSCOPIC EXAMINATION

## 2015-12-03 MED ORDER — SULFAMETHOXAZOLE-TRIMETHOPRIM 800-160 MG PO TABS: 1.0000 | ORAL_TABLET | Freq: Two times a day (BID) | ORAL | Status: DC

## 2015-12-03 NOTE — Progress Notes (Signed)
BP 187/90 mmHg  Pulse 101  Temp(Src) 98 F (36.7 C) (Oral)  Ht 5\' 4"  (1.626 m)  Wt 162 lb 3.2 oz (73.573 kg)  BMI 27.83 kg/m2   Subjective:    Patient ID: [name] [name], female    DOB: 01-26-1956, 60 y.o.   MRN: [id number]  HPI: [name] [name] is a 60 y.o. female presenting on 12/03/2015 for Urinary Tract Infection   HPI Left flank pain and hematuria Patient has been having 2 days worth of left-sided flank pain and back pain and pressure in her bladder and one episode of hematuria yesterday. She denies any fevers but has had chills. She denies any shortness of breath or wheezing or other upper respiratory symptoms. She denies any constipation or diarrhea. She has had recurrent UTIs over the past year. She is also on a blood thinner for DVT or clotting disorder.  Relevant past medical, surgical, family and social history reviewed and updated as indicated. Interim medical history since our last visit reviewed. Allergies and medications reviewed and updated.  Review of Systems  Constitutional: Negative for fever and chills.  HENT: Negative for congestion, ear discharge and ear pain.   Eyes: Negative for redness and visual disturbance.  Respiratory: Negative for chest tightness and shortness of breath.   Cardiovascular: Negative for chest pain and leg swelling.  Gastrointestinal: Positive for abdominal pain. Negative for nausea, vomiting, diarrhea, constipation, blood in stool and anal bleeding.  Genitourinary: Positive for frequency, hematuria and flank pain. Negative for dysuria, vaginal bleeding, vaginal discharge, difficulty urinating, vaginal pain, menstrual problem and pelvic pain.  Musculoskeletal: Negative for back pain and gait problem.  Skin: Negative for rash.  Neurological: Negative for light-headedness and headaches.  Psychiatric/Behavioral: Negative for behavioral problems and agitation.  All other systems reviewed and are negative.   Per HPI unless specifically  indicated above     Medication List       This list is accurate as of: 12/03/15 11:32 AM.  Always use your most recent med list.               lisinopril 20 MG tablet  Commonly known as:  PRINIVIL,ZESTRIL  TAKE 1 TABLET (20 MG TOTAL) BY MOUTH DAILY.     pantoprazole 40 MG tablet  Commonly known as:  PROTONIX  TAKE 1 TABLET (40 MG TOTAL) BY MOUTH DAILY.     rivaroxaban 20 MG Tabs tablet  Commonly known as:  XARELTO  Take 1 tablet (20 mg total) by mouth daily.           Objective:    BP 187/90 mmHg  Pulse 101  Temp(Src) 98 F (36.7 C) (Oral)  Ht 5\' 4"  (1.626 m)  Wt 162 lb 3.2 oz (73.573 kg)  BMI 27.83 kg/m2  Wt Readings from Last 3 Encounters:  12/03/15 162 lb 3.2 oz (73.573 kg)  06/21/15 163 lb 3.2 oz (74.027 kg)  03/13/15 150 lb (68.04 kg)    Physical Exam  Constitutional: She is oriented to person, place, and time. She appears well-developed and well-nourished. No distress.  Eyes: Conjunctivae and EOM are normal. Pupils are equal, round, and reactive to light.  Neck: Neck supple. No thyromegaly present.  Cardiovascular: Normal rate, regular rhythm, normal heart sounds and intact distal pulses.   No murmur heard. Pulmonary/Chest: Effort normal and breath sounds normal. No respiratory distress. She has no wheezes.  Abdominal: Soft. Bowel sounds are normal. She exhibits no distension. There is tenderness (Mild tenderness) in the suprapubic area.  There is CVA tenderness (Left). There is no rebound and no guarding.  Musculoskeletal: Normal range of motion. She exhibits no edema or tenderness.  Lymphadenopathy:    She has no cervical adenopathy.  Neurological: She is alert and oriented to person, place, and time. Coordination normal.  Skin: Skin is warm and dry. No rash noted. She is not diaphoretic.  Psychiatric: She has a normal mood and affect. Her behavior is normal.  Nursing note and vitals reviewed.     Assessment & Plan:   Problem List Items Addressed  This Visit    None    Visit Diagnoses    Dysuria    -  Primary    Relevant Medications    sulfamethoxazole-trimethoprim (BACTRIM DS,SEPTRA DS) 800-160 MG tablet    Other Relevant Orders    Urinalysis, Complete    Urine culture    Acute cystitis with hematuria        Relevant Medications    sulfamethoxazole-trimethoprim (BACTRIM DS,SEPTRA DS) 800-160 MG tablet       Follow up plan: Return if symptoms worsen or fail to improve.  Counseling provided for all of the vaccine components Orders Placed This Encounter  Procedures  . Urine culture  . Urinalysis, Complete    Caryl Pina, MD [facility] 12/03/2015, 11:32 AM

## 2015-12-05 ENCOUNTER — Encounter: Payer: Self-pay | Admitting: Family Medicine

## 2015-12-06 LAB — URINE CULTURE

## 2015-12-07 ENCOUNTER — Telehealth: Payer: Self-pay | Admitting: Family Medicine

## 2015-12-07 NOTE — Telephone Encounter (Signed)
Patient aware, zofran called in to [facility] to take with her antibiotic.

## 2015-12-07 NOTE — Telephone Encounter (Signed)
There are really not many other options for her because of the antibiotic resistance that grew in her previous culture. Take a probiotic and we can send her some Zofran 4 mg ODT #20 no refills. Please encourage her to finish the course.

## 2016-01-12 ENCOUNTER — Other Ambulatory Visit: Payer: Self-pay | Admitting: Family Medicine

## 2016-01-15 ENCOUNTER — Other Ambulatory Visit: Payer: Self-pay | Admitting: Family Medicine

## 2016-01-15 NOTE — Telephone Encounter (Signed)
Advised pt she needed to schedule an appt with Dr. Wendi Snipes prior to any further refills and pt should have at least another week of medication left as she got a 90 day supply on 11/02/15. Pt states she will call back to schedule.

## 2016-01-15 NOTE — Telephone Encounter (Signed)
Last seen for BP on March 2016

## 2016-01-15 NOTE — Telephone Encounter (Signed)
Last refill without being seen 

## 2016-01-22 ENCOUNTER — Other Ambulatory Visit: Payer: BLUE CROSS/BLUE SHIELD

## 2016-01-22 DIAGNOSIS — E785 Hyperlipidemia, unspecified: Secondary | ICD-10-CM | POA: Diagnosis not present

## 2016-01-22 DIAGNOSIS — I10 Essential (primary) hypertension: Secondary | ICD-10-CM

## 2016-01-23 ENCOUNTER — Encounter (HOSPITAL_COMMUNITY): Payer: Self-pay | Admitting: Emergency Medicine

## 2016-01-23 ENCOUNTER — Telehealth: Payer: Self-pay

## 2016-01-23 ENCOUNTER — Emergency Department (HOSPITAL_COMMUNITY)
Admission: EM | Admit: 2016-01-23 | Discharge: 2016-01-23 | Disposition: A | Discharge status: Home / Self Care | Payer: BLUE CROSS/BLUE SHIELD | Attending: Emergency Medicine | Admitting: Emergency Medicine

## 2016-01-23 DIAGNOSIS — I10 Essential (primary) hypertension: Secondary | ICD-10-CM | POA: Insufficient documentation

## 2016-01-23 DIAGNOSIS — F17210 Nicotine dependence, cigarettes, uncomplicated: Secondary | ICD-10-CM | POA: Insufficient documentation

## 2016-01-23 DIAGNOSIS — Z79899 Other long term (current) drug therapy: Secondary | ICD-10-CM | POA: Diagnosis not present

## 2016-01-23 DIAGNOSIS — Z86718 Personal history of other venous thrombosis and embolism: Secondary | ICD-10-CM | POA: Insufficient documentation

## 2016-01-23 DIAGNOSIS — R7989 Other specified abnormal findings of blood chemistry: Secondary | ICD-10-CM | POA: Diagnosis not present

## 2016-01-23 DIAGNOSIS — Z7901 Long term (current) use of anticoagulants: Secondary | ICD-10-CM | POA: Diagnosis not present

## 2016-01-23 DIAGNOSIS — D649 Anemia, unspecified: Secondary | ICD-10-CM

## 2016-01-23 HISTORY — DX: Essential (primary) hypertension: I10

## 2016-01-23 HISTORY — DX: Lupus anticoagulant syndrome: D68.62

## 2016-01-23 LAB — CBC WITH DIFFERENTIAL/PLATELET
Basophils Absolute: 0 10*3/uL (ref 0.0–0.1)
Basophils Relative: 0 %
Eosinophils Absolute: 0.2 10*3/uL (ref 0.0–0.7)
Eosinophils Relative: 2 %
HCT: 29.6 % — ABNORMAL LOW (ref 36.0–46.0)
Hemoglobin: 8.6 g/dL — ABNORMAL LOW (ref 12.0–15.0)
Lymphocytes Relative: 26 %
Lymphs Abs: 2.8 10*3/uL (ref 0.7–4.0)
MCH: 23 pg — ABNORMAL LOW (ref 26.0–34.0)
MCHC: 29.1 g/dL — ABNORMAL LOW (ref 30.0–36.0)
MCV: 79.1 fL (ref 78.0–100.0)
Monocytes Absolute: 0.7 10*3/uL (ref 0.1–1.0)
Monocytes Relative: 6 %
Neutro Abs: 7.2 10*3/uL (ref 1.7–7.7)
Neutrophils Relative %: 66 %
Platelets: 571 10*3/uL — ABNORMAL HIGH (ref 150–400)
RBC: 3.74 MIL/uL — ABNORMAL LOW (ref 3.87–5.11)
RDW: 16.1 % — ABNORMAL HIGH (ref 11.5–15.5)
WBC: 10.8 10*3/uL — ABNORMAL HIGH (ref 4.0–10.5)

## 2016-01-23 LAB — CMP14+EGFR
ALT: 13 IU/L (ref 0–32)
AST: 12 IU/L (ref 0–40)
Albumin/Globulin Ratio: 1.3 (ref 1.2–2.2)
Albumin: 4.1 g/dL (ref 3.6–4.8)
Alkaline Phosphatase: 120 IU/L — ABNORMAL HIGH (ref 39–117)
BUN/Creatinine Ratio: 9 — ABNORMAL LOW (ref 12–28)
BUN: 6 mg/dL — ABNORMAL LOW (ref 8–27)
Bilirubin Total: <0.2 mg/dL (ref 0.0–1.2)
CO2: 20 mmol/L (ref 18–29)
Calcium: 9.4 mg/dL (ref 8.7–10.3)
Chloride: 100 mmol/L (ref 96–106)
Creatinine, Ser: 0.66 mg/dL (ref 0.57–1.00)
GFR calc Af Amer: 111 mL/min/{1.73_m2} (ref >59)
GFR calc non Af Amer: 96 mL/min/{1.73_m2} (ref >59)
Globulin, Total: 3.1 g/dL (ref 1.5–4.5)
Glucose: 120 mg/dL — ABNORMAL HIGH (ref 65–99)
Potassium: 3.9 mmol/L (ref 3.5–5.2)
Sodium: 139 mmol/L (ref 134–144)
Total Protein: 7.2 g/dL (ref 6.0–8.5)

## 2016-01-23 LAB — CBC
Hematocrit: 26.1 % — ABNORMAL LOW (ref 34.0–46.6)
Hemoglobin: 8.3 g/dL — CL (ref 11.1–15.9)
MCH: 24.1 pg — ABNORMAL LOW (ref 26.6–33.0)
MCHC: 31.8 g/dL (ref 31.5–35.7)
MCV: 76 fL — ABNORMAL LOW (ref 79–97)
Platelets: 560 10*3/uL — ABNORMAL HIGH (ref 150–379)
RBC: 3.45 x10E6/uL — ABNORMAL LOW (ref 3.77–5.28)
RDW: 16.5 % — ABNORMAL HIGH (ref 12.3–15.4)
WBC: 10.8 10*3/uL (ref 3.4–10.8)

## 2016-01-23 LAB — LIPID PANEL
Chol/HDL Ratio: 12.4 ratio units — ABNORMAL HIGH (ref 0.0–4.4)
Cholesterol, Total: 311 mg/dL — ABNORMAL HIGH (ref 100–199)
HDL: 25 mg/dL — ABNORMAL LOW (ref >39)
Triglycerides: 667 mg/dL (ref 0–149)

## 2016-01-23 LAB — T4, FREE: Free T4: 0.96 ng/dL (ref 0.82–1.77)

## 2016-01-23 LAB — T3, FREE: T3, Free: 2.7 pg/mL (ref 2.0–4.4)

## 2016-01-23 LAB — TSH: TSH: 1.18 u[IU]/mL (ref 0.450–4.500)

## 2016-01-23 NOTE — ED Notes (Signed)
Pt reports called by MD office to come to ED. Pt reports hgb was 8 yesterday. Pt asymptomatic, alert, oriented x4

## 2016-01-23 NOTE — Discharge Instructions (Signed)
Your blood out is low, but has been stable over the last 24 hours. Your MCV is low, indicating that you need more iron. Try to improve your iron intake, by eating foods which contain a lot of iron. Also take 2 iron tablets a day with stool softener, for 1 month, to improve your blood count. Return here or see your doctor if needed.    Anemia, Nonspecific Anemia is a condition in which the concentration of red blood cells or hemoglobin in the blood is below normal. Hemoglobin is a substance in red blood cells that carries oxygen to the tissues of the body. Anemia results in not enough oxygen reaching these tissues.  CAUSES  Common causes of anemia include:   Excessive bleeding. Bleeding may be internal or external. This includes excessive bleeding from periods (in women) or from the intestine.   Poor nutrition.   Chronic kidney, thyroid, and liver disease.  Bone marrow disorders that decrease red blood cell production.  Cancer and treatments for cancer.  HIV, AIDS, and their treatments.  Spleen problems that increase red blood cell destruction.  Blood disorders.  Excess destruction of red blood cells due to infection, medicines, and autoimmune disorders. SIGNS AND SYMPTOMS   Minor weakness.   Dizziness.   Headache.  Palpitations.   Shortness of breath, especially with exercise.   Paleness.  Cold sensitivity.  Indigestion.  Nausea.  Difficulty sleeping.  Difficulty concentrating. Symptoms may occur suddenly or they may develop slowly.  DIAGNOSIS  Additional blood tests are often needed. These help your health care provider determine the best treatment. Your health care provider will check your stool for blood and look for other causes of blood loss.  TREATMENT  Treatment varies depending on the cause of the anemia. Treatment can include:   Supplements of iron, vitamin 123456, or folic acid.   Hormone medicines.   A blood transfusion. This may be  needed if blood loss is severe.   Hospitalization. This may be needed if there is significant continual blood loss.   Dietary changes.  Spleen removal. HOME CARE INSTRUCTIONS Keep all follow-up appointments. It often takes many weeks to correct anemia, and having your health care provider check on your condition and your response to treatment is very important. SEEK IMMEDIATE MEDICAL CARE IF:   You develop extreme weakness, shortness of breath, or chest pain.   You become dizzy or have trouble concentrating.  You develop heavy vaginal bleeding.   You develop a rash.   You have bloody or black, tarry stools.   You faint.   You vomit up blood.   You vomit repeatedly.   You have abdominal pain.  You have a fever or persistent symptoms for more than 2-3 days.   You have a fever and your symptoms suddenly get worse.   You are dehydrated.  MAKE SURE YOU:  Understand these instructions.  Will watch your condition.  Will get help right away if you are not doing well or get worse.   This information is not intended to replace advice given to you by your health care provider. Make sure you discuss any questions you have with your health care provider.   Document Released: 09/11/2004 Document Revised: 04/06/2013 Document Reviewed: 01/28/2013 Elsevier Interactive Patient Education [employer].

## 2016-01-23 NOTE — ED Notes (Signed)
Patient was seen by her PCP yesterday and had her CBC checked.  Patient states that she received a call from her MD's office telling her to go to the ED because her HgB was 8.3.  Patient states that she is felling fine.  Denies any bleeding, pain, nausea, vomiting, diarrhea. Patient states that she takes Chief Strategy Officer.

## 2016-01-23 NOTE — Telephone Encounter (Signed)
Patient had labwork completed on 01/22/16, Hemoglobin level is critical at 8.3.  Per Dr. Warrick Parisian, patient was contacted and advised to go to ER.  Patient states she will go to a [facility] facility for treatment.

## 2016-01-23 NOTE — ED Provider Notes (Addendum)
CSN: [id number]     Arrival date & time 01/23/16  1013 History   First MD Initiated Contact with Patient 01/23/16 1107     Chief Complaint  Patient presents with  . Abnormal Lab     (Consider location/radiation/quality/duration/timing/severity/associated sxs/prior Treatment) HPI   [name] [name] is a 60 y.o. female who presents for evaluation of low blood counts. She had routine testing done yesterday which revealed hemoglobin 8.3. Her PCP contacted her and told her to go to the closest emergency department. She denies any visualized blood loss including stool, or symptoms of anemia. No prior similar problem known to patient. She denies chest pain, shortness of breath, cough, fever, chills, nausea, vomiting, diarrhea or constipation. There are no other no modifying factors.    Past Medical History  Diagnosis Date  . DVT (deep venous thrombosis) ([place])   . Pulmonary embolism ([place])   . Back pain   . Lupus anticoagulant syndrome ([place])   . Hypertension    Past Surgical History  Procedure Laterality Date  . Back surgery    . Colonoscopy  05/19/2012    Procedure: COLONOSCOPY;  Surgeon: Beryle Beams, MD;  Location: [facility] ENDOSCOPY;  Service: Endoscopy;  Laterality: N/A;  . Esophagogastroduodenoscopy  05/19/2012    Procedure: ESOPHAGOGASTRODUODENOSCOPY (EGD);  Surgeon: Beryle Beams, MD;  Location: [facility] ENDOSCOPY;  Service: Endoscopy;  Laterality: N/A;   Family History  Problem Relation Age of Onset  . Leukemia Mother   . Prostate cancer Father    Social History  Substance Use Topics  . Smoking status: Current Every Day Smoker -- 0.50 packs/day for 29 years    Types: Cigarettes  . Smokeless tobacco: Never Used  . Alcohol Use: Yes     Comment: Occasional   OB History    No data available     Review of Systems  All other systems reviewed and are negative.     Allergies  Prednisone and Tramadol  Home Medications   Prior to Admission medications   Medication Sig Start Date  End Date Taking? Authorizing Provider  lisinopril (PRINIVIL,ZESTRIL) 20 MG tablet TAKE 1 TABLET (20 MG TOTAL) BY MOUTH DAILY. 01/15/16   Mary-Margaret Hassell Done, FNP  pantoprazole (PROTONIX) 40 MG tablet TAKE 1 TABLET (40 MG TOTAL) BY MOUTH DAILY. 11/21/15   Timmothy Euler, MD  rivaroxaban (XARELTO) 20 MG TABS tablet Take 1 tablet (20 mg total) by mouth daily. 11/01/15   Timmothy Euler, MD  sulfamethoxazole-trimethoprim (BACTRIM DS,SEPTRA DS) 800-160 MG tablet Take 1 tablet by mouth 2 (two) times daily. 12/03/15   Fransisca Kaufmann Dettinger, MD   BP 181/82 mmHg  Pulse 100  Temp(Src) 98.7 F (37.1 C) (Oral)  Resp 15  SpO2 99% Physical Exam  Constitutional: She is oriented to person, place, and time. She appears well-developed and well-nourished. No distress.  HENT:  Head: Normocephalic and atraumatic.  Right Ear: External ear normal.  Left Ear: External ear normal.  Eyes: Conjunctivae and EOM are normal. Pupils are equal, round, and reactive to light.  Neck: Normal range of motion and phonation normal. Neck supple.  Cardiovascular: Normal rate and regular rhythm.   Pulmonary/Chest: Effort normal. She exhibits no bony tenderness.  Musculoskeletal: Normal range of motion.  Neurological: She is alert and oriented to person, place, and time. No cranial nerve deficit or sensory deficit. She exhibits normal muscle tone. Coordination normal.  Skin: Skin is warm, dry and intact.  Psychiatric: She has a normal mood and affect. Her behavior is  normal. Judgment and thought content normal.  Nursing note and vitals reviewed.   ED Course  Procedures (including critical care time)  Medications - No data to display  Patient Vitals for the past 24 hrs:  BP Temp Temp src Pulse Resp SpO2  01/23/16 1257 181/82 mmHg - - 100 15 99 %  01/23/16 1028 194/88 mmHg 98.7 F (37.1 C) Oral 109 18 100 %    1:21 PM Reevaluation with update and discussion. After initial assessment and treatment, an updated evaluation  reveals No additional complaints. Findings discussed with patient and husband, all questions answered. [place] Review Labs Reviewed  CBC WITH DIFFERENTIAL/PLATELET - Abnormal; Notable for the following:    WBC 10.8 (*)    RBC 3.74 (*)    Hemoglobin 8.6 (*)    HCT 29.6 (*)    MCH 23.0 (*)    MCHC 29.1 (*)    RDW 16.1 (*)    Platelets 571 (*)    All other components within normal limits    Imaging Review No results found. I have personally reviewed and evaluated these images and lab results as part of my medical decision-making.   EKG Interpretation None      MDM   Final diagnoses:  Anemia, unspecified anemia type    Anemia, uncovered, routine laboratory testing in an asymptomatic patient. MCV is low, indicating iron deficiency. No change of hemoglobin from yesterday to today.  Nursing Notes Reviewed/ Care Coordinated Applicable Imaging Reviewed Interpretation of Laboratory Data incorporated into ED treatment  The patient appears reasonably screened and/or stabilized for discharge and I doubt any other medical condition or other [facility] requiring further screening, evaluation, or treatment in the ED at this time prior to discharge.  Plan: Home Medications- tablets twice a day for 1 month with Colace.; Home Treatments- rest, fluids, increased iron in diet; return here if the recommended treatment, does not improve the symptoms; Recommended follow up- PCP checkup one month and when necessary     Daleen Bo, MD 01/23/16 [place], MD 01/23/16 1323

## 2016-01-24 ENCOUNTER — Ambulatory Visit (INDEPENDENT_AMBULATORY_CARE_PROVIDER_SITE_OTHER): Payer: BLUE CROSS/BLUE SHIELD | Admitting: Family Medicine

## 2016-01-24 ENCOUNTER — Encounter: Payer: Self-pay | Admitting: Family Medicine

## 2016-01-24 VITALS — BP 159/81 | HR 89 | Temp 98.1°F | Ht 64.0 in | Wt 162.8 lb

## 2016-01-24 DIAGNOSIS — E781 Pure hyperglyceridemia: Secondary | ICD-10-CM | POA: Diagnosis not present

## 2016-01-24 DIAGNOSIS — D509 Iron deficiency anemia, unspecified: Secondary | ICD-10-CM | POA: Diagnosis not present

## 2016-01-24 DIAGNOSIS — E785 Hyperlipidemia, unspecified: Secondary | ICD-10-CM | POA: Diagnosis not present

## 2016-01-24 DIAGNOSIS — L299 Pruritus, unspecified: Secondary | ICD-10-CM | POA: Diagnosis not present

## 2016-01-24 MED ORDER — RIVAROXABAN 20 MG PO TABS: 20.0000 mg | ORAL_TABLET | Freq: Every day | ORAL | Status: DC

## 2016-01-24 MED ORDER — HYDROXYZINE HCL 50 MG PO TABS: 50.0000 mg | ORAL_TABLET | Freq: Three times a day (TID) | ORAL | Status: DC | PRN

## 2016-01-24 MED ORDER — LISINOPRIL 20 MG PO TABS: 20.0000 mg | ORAL_TABLET | Freq: Every day | ORAL | Status: DC

## 2016-01-24 NOTE — Progress Notes (Signed)
   HPI  Patient presents today here for follow-up.  Patient has slowly progressive anemia and was sent to the hospital yesterday for hemoglobin of 8.3. She was reassured and started on iron. She feels well, has no signs or symptoms of anemia. Also for hypercoagulable state and recurrent DVT.  Cholesterol Watching her diet, she is a vegetarian Does not want to try statins again, however she is willing considering high risk of heart disease.  Itching Describes several months history of progressive itching She feels that it's probably related to stress. She would like to try medication, however she does not want anything addictive.   PMH: Smoking status noted ROS: Per HPI  Objective: BP 159/81 mmHg  Pulse 89  Temp(Src) 98.1 F (36.7 C) (Oral)  Ht 5\' 4"  (1.626 m)  Wt 162 lb 12.8 oz (73.846 kg)  BMI 27.93 kg/m2 Gen: NAD, alert, cooperative with exam HEENT: NCAT, EOMI, PERRL CV: RRR, good S1/S2, no murmur Resp: CTABL, no wheezes, non-labored Ext: No edema, warm Neuro: Alert and oriented, No gross deficits  Assessment and plan:  # Hypercoagulable state, microcytic anemia Slowly progressive anemia over the last 2 years Increased risk of small bleeding given Xarelto Recommend iron replacement, I would like to check her iron levels directly, however I don't think it's worth another blood draw since that the only thing I would need Recheck in 4-6 weeks Continue Xarelto and watch for signs of bleeding  # Hypertension Elevated, patient is a little bit upset about the ER visit Recheck next month Refilled lisinopril  # Hyper lipidemia, hypertriglyceridemia Recommended statin and fenofibrate acid, 23% 10 year ASCVD risk She would like to try diet and exercise for month, recheck in 4-6 weeks  # Itching Trial of Atarax    Laroy Apple, MD [facility] 01/24/2016, 5:25 PM

## 2016-01-24 NOTE — Patient Instructions (Signed)
Great to see you!  Lets plan to look at your hemoglobin and lipid panel in 4-6 weeks and follow up a few days after that.   I would recommend twice daily iron as long as you can tolerate it.   Please let us know if you see any signs of bleeding

## 2016-01-26 ENCOUNTER — Other Ambulatory Visit: Payer: Self-pay | Admitting: Family Medicine

## 2016-02-11 ENCOUNTER — Other Ambulatory Visit: Payer: Self-pay | Admitting: Nurse Practitioner

## 2016-02-12 ENCOUNTER — Other Ambulatory Visit: Payer: Self-pay | Admitting: Nurse Practitioner

## 2016-02-26 ENCOUNTER — Other Ambulatory Visit: Payer: Self-pay

## 2016-02-26 ENCOUNTER — Other Ambulatory Visit: Payer: Self-pay | Admitting: *Deleted

## 2016-02-26 MED ORDER — HYDROXYZINE HCL 50 MG PO TABS
50.0000 mg | ORAL_TABLET | Freq: Three times a day (TID) | ORAL | Status: DC | PRN
Start: 2016-02-26 — End: 2016-02-26

## 2016-02-26 MED ORDER — HYDROXYZINE HCL 50 MG PO TABS: 50.0000 mg | ORAL_TABLET | Freq: Three times a day (TID) | ORAL | Status: DC | PRN

## 2016-03-12 ENCOUNTER — Other Ambulatory Visit: Payer: Self-pay | Admitting: Family Medicine

## 2016-03-18 DIAGNOSIS — R319 Hematuria, unspecified: Secondary | ICD-10-CM | POA: Diagnosis not present

## 2016-03-18 DIAGNOSIS — N390 Urinary tract infection, site not specified: Secondary | ICD-10-CM | POA: Diagnosis not present

## 2016-03-23 DIAGNOSIS — Z7901 Long term (current) use of anticoagulants: Secondary | ICD-10-CM | POA: Diagnosis not present

## 2016-03-23 DIAGNOSIS — N390 Urinary tract infection, site not specified: Secondary | ICD-10-CM | POA: Diagnosis not present

## 2016-03-23 DIAGNOSIS — Z86718 Personal history of other venous thrombosis and embolism: Secondary | ICD-10-CM | POA: Diagnosis not present

## 2016-03-23 DIAGNOSIS — Z86711 Personal history of pulmonary embolism: Secondary | ICD-10-CM | POA: Diagnosis not present

## 2016-03-26 ENCOUNTER — Other Ambulatory Visit: Payer: Self-pay | Admitting: Family Medicine

## 2016-04-16 ENCOUNTER — Other Ambulatory Visit: Payer: Self-pay | Admitting: Family Medicine

## 2016-04-17 ENCOUNTER — Other Ambulatory Visit: Payer: Self-pay | Admitting: *Deleted

## 2016-04-17 MED ORDER — LISINOPRIL 20 MG PO TABS: ORAL_TABLET | ORAL | 3 refills | Status: DC

## 2016-04-24 ENCOUNTER — Other Ambulatory Visit: Payer: Self-pay | Admitting: Family Medicine

## 2016-05-09 ENCOUNTER — Other Ambulatory Visit: Payer: Self-pay | Admitting: Family Medicine

## 2016-05-25 ENCOUNTER — Other Ambulatory Visit: Payer: Self-pay | Admitting: Family Medicine

## 2016-06-11 ENCOUNTER — Other Ambulatory Visit: Payer: Self-pay

## 2016-06-11 MED ORDER — PANTOPRAZOLE SODIUM 40 MG PO TBEC: 40.0000 mg | DELAYED_RELEASE_TABLET | Freq: Every day | ORAL | 0 refills | Status: DC

## 2016-06-25 ENCOUNTER — Other Ambulatory Visit: Payer: Self-pay | Admitting: Family Medicine

## 2016-07-28 ENCOUNTER — Other Ambulatory Visit: Payer: Self-pay | Admitting: Family Medicine

## 2016-08-28 ENCOUNTER — Other Ambulatory Visit: Payer: Self-pay | Admitting: Family Medicine

## 2016-09-19 ENCOUNTER — Other Ambulatory Visit: Payer: Self-pay | Admitting: Family Medicine

## 2016-09-28 ENCOUNTER — Other Ambulatory Visit: Payer: Self-pay | Admitting: Family Medicine

## 2016-10-26 ENCOUNTER — Other Ambulatory Visit: Payer: Self-pay | Admitting: Family Medicine

## 2016-12-15 ENCOUNTER — Telehealth: Payer: Self-pay | Admitting: Family Medicine

## 2016-12-15 ENCOUNTER — Other Ambulatory Visit: Payer: Self-pay | Admitting: Family Medicine

## 2016-12-15 NOTE — Telephone Encounter (Signed)
With her history I do not recommend stopping xarelto fro routine dental extraction.   Laroy Apple, MD [facility] 12/15/2016, 1:34 PM

## 2016-12-15 NOTE — Telephone Encounter (Signed)
Pt notified of recommendation Verbalizes understanding 

## 2016-12-23 ENCOUNTER — Other Ambulatory Visit: Payer: Self-pay | Admitting: Family Medicine

## 2016-12-25 ENCOUNTER — Ambulatory Visit: Payer: BLUE CROSS/BLUE SHIELD | Admitting: Family Medicine

## 2016-12-25 ENCOUNTER — Ambulatory Visit (INDEPENDENT_AMBULATORY_CARE_PROVIDER_SITE_OTHER): Payer: BLUE CROSS/BLUE SHIELD | Admitting: Family

## 2016-12-25 ENCOUNTER — Encounter: Payer: Self-pay | Admitting: Family

## 2016-12-25 VITALS — BP 190/92 | HR 99 | Temp 97.7°F | Ht 64.0 in | Wt 160.8 lb

## 2016-12-25 DIAGNOSIS — Z720 Tobacco use: Secondary | ICD-10-CM

## 2016-12-25 DIAGNOSIS — Z114 Encounter for screening for human immunodeficiency virus [HIV]: Secondary | ICD-10-CM

## 2016-12-25 DIAGNOSIS — E611 Iron deficiency: Secondary | ICD-10-CM | POA: Diagnosis not present

## 2016-12-25 DIAGNOSIS — I10 Essential (primary) hypertension: Secondary | ICD-10-CM | POA: Diagnosis not present

## 2016-12-25 DIAGNOSIS — E785 Hyperlipidemia, unspecified: Secondary | ICD-10-CM

## 2016-12-25 DIAGNOSIS — K219 Gastro-esophageal reflux disease without esophagitis: Secondary | ICD-10-CM | POA: Insufficient documentation

## 2016-12-25 DIAGNOSIS — Z1159 Encounter for screening for other viral diseases: Secondary | ICD-10-CM

## 2016-12-25 DIAGNOSIS — Z86718 Personal history of other venous thrombosis and embolism: Secondary | ICD-10-CM

## 2016-12-25 MED ORDER — LISINOPRIL 40 MG PO TABS: 40.0000 mg | ORAL_TABLET | Freq: Every day | ORAL | 1 refills | Status: DC

## 2016-12-25 MED ORDER — PANTOPRAZOLE SODIUM 40 MG PO TBEC: 40.0000 mg | DELAYED_RELEASE_TABLET | Freq: Every day | ORAL | 0 refills | Status: DC

## 2016-12-25 MED ORDER — RIVAROXABAN 20 MG PO TABS: 20.0000 mg | ORAL_TABLET | Freq: Every day | ORAL | 3 refills | Status: DC

## 2016-12-25 NOTE — Addendum Note (Signed)
Addended by: Evelina Dun A on: 12/25/2016 10:26 AM   Modules accepted: Orders

## 2016-12-25 NOTE — Patient Instructions (Signed)

## 2016-12-25 NOTE — Progress Notes (Signed)
Subjective:    Patient ID: [name] [name], female    DOB: 06/06/1956, 61 y.o.   MRN: [id number]  PT presents to the office today for chronic follow up. PT's BP is elevated today, but states she takes her BP at home every week and her BP is always 140's/90's.  Hypertension  This is a chronic problem. The current episode started more than 1 year ago. The problem has been waxing and waning since onset. The problem is uncontrolled. Pertinent negatives include no malaise/fatigue, palpitations, peripheral edema or shortness of breath. The current treatment provides moderate improvement. There is no history of kidney disease, CAD/MI, CVA or heart failure.  Hyperlipidemia  This is a chronic problem. The current episode started more than 1 year ago. The problem is uncontrolled. Recent lipid tests were reviewed and are high. Exacerbating diseases include obesity. Factors aggravating her hyperlipidemia include smoking. Pertinent negatives include no shortness of breath. Current antihyperlipidemic treatment includes diet change. The current treatment provides mild improvement of lipids. Risk factors for coronary artery disease include dyslipidemia, hypertension, obesity and post-menopausal.  Gastroesophageal Reflux  She complains of heartburn. She reports no belching. This is a chronic problem. The current episode started more than 1 year ago. The problem has been waxing and waning. Risk factors include smoking/tobacco exposure. She has tried a PPI for the symptoms. The treatment provided moderate relief.  Anemia  Presents for follow-up visit. There has been no leg swelling, light-headedness, malaise/fatigue or palpitations. There is no history of heart failure.  HX PE & DVT Pt currently taking xarelto 20 mg daily.     Review of Systems  Constitutional: Negative for malaise/fatigue.  Respiratory: Negative for shortness of breath.   Cardiovascular: Negative for palpitations.  Gastrointestinal: Positive for  heartburn.  Neurological: Negative for light-headedness.  All other systems reviewed and are negative.      Objective:   Physical Exam  Constitutional: She is oriented to person, place, and time. She appears well-developed and well-nourished. No distress.  HENT:  Head: Normocephalic and atraumatic.  Right Ear: External ear normal.  Mouth/Throat: Oropharynx is clear and moist.  Eyes: Pupils are equal, round, and reactive to light.  Neck: Normal range of motion. Neck supple. No thyromegaly present.  Cardiovascular: Normal rate, regular rhythm, normal heart sounds and intact distal pulses.   No murmur heard. Pulmonary/Chest: Effort normal and breath sounds normal. No respiratory distress. She has no wheezes.  Abdominal: Soft. Bowel sounds are normal. She exhibits no distension. There is no tenderness.  Musculoskeletal: Normal range of motion. She exhibits no edema or tenderness.  Neurological: She is alert and oriented to person, place, and time.  Skin: Skin is warm and dry.  Psychiatric: She has a normal mood and affect. Her behavior is normal. Judgment and thought content normal.  Vitals reviewed.     BP (!) 190/92   Pulse 99   Temp 97.7 F (36.5 C) (Oral)   Ht _0  (1.626 m)   Wt 160 lb 12.8 oz (72.9 kg)   BMI 27.60 kg/m      Assessment & Plan:  1. Essential hypertension -Lisinopril increased to 40 mg from 20 mg .-Daily blood pressure log given with instructions on how to fill out and told to bring to next visit -Dash diet information given -Exercise encouraged - Stress Management  -Continue current meds -RTO in 2 weeks  - CMP14+EGFR - lisinopril (PRINIVIL,ZESTRIL) 40 MG tablet; Take 1 tablet (40 mg total) by mouth daily.  Dispense: 90  tablet; Refill: 1  2. Tobacco abuse - CMP14+EGFR  3. Hyperlipidemia, unspecified hyperlipidemia type - CMP14+EGFR - Lipid panel  4. History of DVT of lower extremity - CMP14+EGFR  5. Gastroesophageal reflux disease,  esophagitis presence not specified - CMP14+EGFR  6. Iron deficiency  - CMP14+EGFR - Anemia Profile B  7. Encounter for screening for HIV - HIV antibody - CMP14+EGFR  8. Need for hepatitis C screening test - Hepatitis C antibody - CMP14+EGFR   Continue all meds Labs pending Health Maintenance reviewed Diet and exercise encouraged RTO 2 weeks to recheck HTN  Evelina Dun, FNP

## 2016-12-26 ENCOUNTER — Other Ambulatory Visit: Payer: Self-pay | Admitting: Family

## 2016-12-26 LAB — ANEMIA PROFILE B
Basophils Absolute: 0 10*3/uL (ref 0.0–0.2)
Basos: 0 %
EOS (ABSOLUTE): 0.2 10*3/uL (ref 0.0–0.4)
Eos: 2 %
Ferritin: 10 ng/mL — ABNORMAL LOW (ref 15–150)
Folate: >20 ng/mL (ref >3.0)
Hematocrit: 34.2 % (ref 34.0–46.6)
Hemoglobin: 11.1 g/dL (ref 11.1–15.9)
Immature Grans (Abs): 0 10*3/uL (ref 0.0–0.1)
Immature Granulocytes: 0 %
Iron Saturation: 8 % — CL (ref 15–55)
Iron: 26 ug/dL — ABNORMAL LOW (ref 27–139)
Lymphocytes Absolute: 3.1 10*3/uL (ref 0.7–3.1)
Lymphs: 32 %
MCH: 26.7 pg (ref 26.6–33.0)
MCHC: 32.5 g/dL (ref 31.5–35.7)
MCV: 82 fL (ref 79–97)
Monocytes Absolute: 0.6 10*3/uL (ref 0.1–0.9)
Monocytes: 6 %
Neutrophils Absolute: 6 10*3/uL (ref 1.4–7.0)
Neutrophils: 60 %
Platelets: 533 10*3/uL — ABNORMAL HIGH (ref 150–379)
RBC: 4.15 x10E6/uL (ref 3.77–5.28)
RDW: 15.4 % (ref 12.3–15.4)
Retic Ct Pct: 1.1 % (ref 0.6–2.6)
Total Iron Binding Capacity: 317 ug/dL (ref 250–450)
UIBC: 291 ug/dL (ref 118–369)
Vitamin B-12: 220 pg/mL — ABNORMAL LOW (ref 232–1245)
WBC: 10 10*3/uL (ref 3.4–10.8)

## 2016-12-26 LAB — CMP14+EGFR
ALT: 14 IU/L (ref 0–32)
AST: 17 IU/L (ref 0–40)
Albumin/Globulin Ratio: 1.4 (ref 1.2–2.2)
Albumin: 4.2 g/dL (ref 3.6–4.8)
Alkaline Phosphatase: 133 IU/L — ABNORMAL HIGH (ref 39–117)
BUN/Creatinine Ratio: 8 — ABNORMAL LOW (ref 12–28)
BUN: 6 mg/dL — ABNORMAL LOW (ref 8–27)
Bilirubin Total: 0.2 mg/dL (ref 0.0–1.2)
CO2: 24 mmol/L (ref 18–29)
Calcium: 9.7 mg/dL (ref 8.7–10.3)
Chloride: 101 mmol/L (ref 96–106)
Creatinine, Ser: 0.75 mg/dL (ref 0.57–1.00)
GFR calc Af Amer: 99 mL/min/{1.73_m2} (ref >59)
GFR calc non Af Amer: 86 mL/min/{1.73_m2} (ref >59)
Globulin, Total: 3.1 g/dL (ref 1.5–4.5)
Glucose: 91 mg/dL (ref 65–99)
Potassium: 4.3 mmol/L (ref 3.5–5.2)
Sodium: 142 mmol/L (ref 134–144)
Total Protein: 7.3 g/dL (ref 6.0–8.5)

## 2016-12-26 LAB — LIPID PANEL
Chol/HDL Ratio: 13.5 ratio — ABNORMAL HIGH (ref 0.0–4.4)
Cholesterol, Total: 350 mg/dL — ABNORMAL HIGH (ref 100–199)
HDL: 26 mg/dL — ABNORMAL LOW (ref >39)
Triglycerides: 718 mg/dL (ref 0–149)

## 2016-12-26 LAB — HIV ANTIBODY (ROUTINE TESTING W REFLEX): HIV Screen 4th Generation wRfx: NONREACTIVE

## 2016-12-26 LAB — HEPATITIS C ANTIBODY: Hep C Virus Ab: <0.1 s/co ratio (ref 0.0–0.9)

## 2016-12-26 MED ORDER — FENOFIBRATE 145 MG PO TABS: 145.0000 mg | ORAL_TABLET | Freq: Every day | ORAL | 2 refills | Status: DC

## 2016-12-29 ENCOUNTER — Telehealth: Payer: Self-pay | Admitting: Family Medicine

## 2016-12-29 NOTE — Telephone Encounter (Signed)
Alyse Low, can you address pt's question

## 2016-12-30 NOTE — Telephone Encounter (Signed)
These two medications are safe to take together. No interactions noted. PT needs to be low fat diet and exercise

## 2016-12-31 ENCOUNTER — Telehealth: Payer: Self-pay | Admitting: Family Medicine

## 2017-01-01 NOTE — Telephone Encounter (Signed)
Pt notified of Evelina Dun' recommendation Pt verbalizes understanding

## 2017-01-18 ENCOUNTER — Other Ambulatory Visit: Payer: Self-pay | Admitting: Family

## 2017-04-23 ENCOUNTER — Other Ambulatory Visit: Payer: Self-pay | Admitting: Family Medicine

## 2017-06-19 ENCOUNTER — Other Ambulatory Visit: Payer: Self-pay | Admitting: Family Medicine

## 2017-07-22 ENCOUNTER — Other Ambulatory Visit: Payer: Self-pay | Admitting: *Deleted

## 2017-07-28 ENCOUNTER — Other Ambulatory Visit: Payer: Self-pay | Admitting: *Deleted

## 2017-07-29 ENCOUNTER — Other Ambulatory Visit: Payer: Self-pay

## 2017-07-29 MED ORDER — LISINOPRIL 20 MG PO TABS: 20.0000 mg | ORAL_TABLET | Freq: Every day | ORAL | 1 refills | Status: DC

## 2017-09-23 ENCOUNTER — Other Ambulatory Visit: Payer: Self-pay | Admitting: Family

## 2017-11-15 ENCOUNTER — Other Ambulatory Visit: Payer: Self-pay | Admitting: Family

## 2017-11-26 ENCOUNTER — Ambulatory Visit: Payer: BLUE CROSS/BLUE SHIELD | Admitting: Family

## 2017-11-26 ENCOUNTER — Encounter: Payer: Self-pay | Admitting: Family

## 2017-11-26 VITALS — BP 161/79 | HR 99 | Temp 97.8°F | Ht 64.0 in | Wt 165.0 lb

## 2017-11-26 DIAGNOSIS — E785 Hyperlipidemia, unspecified: Secondary | ICD-10-CM

## 2017-11-26 DIAGNOSIS — Z720 Tobacco use: Secondary | ICD-10-CM

## 2017-11-26 DIAGNOSIS — E611 Iron deficiency: Secondary | ICD-10-CM | POA: Diagnosis not present

## 2017-11-26 DIAGNOSIS — Z86718 Personal history of other venous thrombosis and embolism: Secondary | ICD-10-CM | POA: Diagnosis not present

## 2017-11-26 DIAGNOSIS — K219 Gastro-esophageal reflux disease without esophagitis: Secondary | ICD-10-CM

## 2017-11-26 DIAGNOSIS — I10 Essential (primary) hypertension: Secondary | ICD-10-CM | POA: Diagnosis not present

## 2017-11-26 MED ORDER — RIVAROXABAN 20 MG PO TABS: 20.0000 mg | ORAL_TABLET | Freq: Every day | ORAL | 3 refills | Status: DC

## 2017-11-26 MED ORDER — PANTOPRAZOLE SODIUM 40 MG PO TBEC: 40.0000 mg | DELAYED_RELEASE_TABLET | Freq: Every day | ORAL | 1 refills | Status: DC

## 2017-11-26 MED ORDER — LISINOPRIL 20 MG PO TABS: 20.0000 mg | ORAL_TABLET | Freq: Every day | ORAL | 1 refills | Status: DC

## 2017-11-26 NOTE — Patient Instructions (Signed)

## 2017-11-26 NOTE — Progress Notes (Signed)
Subjective:    Patient ID: [name] [name], female    DOB: [birth year], 62 y.o.   MRN: [id number]  PT presents to the office today for chronic follow up.  Hypertension  This is a chronic problem. The current episode started more than 1 year ago. The problem has been waxing and waning since onset. The problem is uncontrolled. Associated symptoms include peripheral edema (left ankle). Pertinent negatives include no headaches, malaise/fatigue, palpitations or shortness of breath. Risk factors for coronary artery disease include dyslipidemia, sedentary lifestyle and smoking/tobacco exposure. There is no history of kidney disease, CAD/MI, CVA or heart failure.  Hyperlipidemia  This is a chronic problem. The current episode started more than 1 year ago. The problem is uncontrolled. Recent lipid tests were reviewed and are high. Pertinent negatives include no shortness of breath. Current antihyperlipidemic treatment includes diet change. The current treatment provides no improvement of lipids. Risk factors for coronary artery disease include dyslipidemia, a sedentary lifestyle, post-menopausal and hypertension.  Anemia  Presents for follow-up visit. There has been no bruising/bleeding easily, malaise/fatigue or palpitations. There is no history of heart failure.  Gastroesophageal Reflux  She complains of belching and heartburn. This is a chronic problem. The current episode started more than 1 year ago. The problem has been waxing and waning. The treatment provided no relief.  HX DVT PT currently taking Xarelto daily.     Review of Systems  Constitutional: Negative for malaise/fatigue.  Respiratory: Negative for shortness of breath.   Cardiovascular: Negative for palpitations.  Gastrointestinal: Positive for heartburn.  Neurological: Negative for headaches.  Hematological: Does not bruise/bleed easily.  All other systems reviewed and are negative.      Objective:   Physical Exam    Constitutional: She is oriented to person, place, and time. She appears well-developed and well-nourished. No distress.  HENT:  Head: Normocephalic and atraumatic.  Right Ear: External ear normal.  Left Ear: External ear normal.  Nose: Nose normal.  Mouth/Throat: Oropharynx is clear and moist.  Eyes: Pupils are equal, round, and reactive to light.  Neck: Normal range of motion. Neck supple. No thyromegaly present.  Cardiovascular: Normal rate, regular rhythm, normal heart sounds and intact distal pulses.  No murmur heard. Pulmonary/Chest: Effort normal and breath sounds normal. No respiratory distress. She has no wheezes.  Abdominal: Soft. Bowel sounds are normal. She exhibits no distension. There is no tenderness.  Musculoskeletal: Normal range of motion. She exhibits no edema or tenderness.  Neurological: She is alert and oriented to person, place, and time.  Skin: Skin is warm and dry.  Psychiatric: She has a normal mood and affect. Her behavior is normal. Judgment and thought content normal.  Vitals reviewed.   BP (!) 161/79   Pulse 99   Temp 97.8 F (36.6 C) (Oral)   Ht 5' 4"  (1.626 m)   Wt 165 lb (74.8 kg)   BMI 28.32 kg/m      Assessment & Plan:  1. Essential hypertension - CMP14+EGFR - lisinopril (PRINIVIL,ZESTRIL) 20 MG tablet; Take 1 tablet (20 mg total) by mouth daily.  Dispense: 90 tablet; Refill: 1  2. Gastroesophageal reflux disease, esophagitis presence not specified - CMP14+EGFR - pantoprazole (PROTONIX) 40 MG tablet; Take 1 tablet (40 mg total) by mouth daily.  Dispense: 90 tablet; Refill: 1  3. Iron deficiency - Anemia Profile B - CMP14+EGFR  4. History of DVT of lower extremity - CMP14+EGFR - rivaroxaban (XARELTO) 20 MG TABS tablet; Take 1 tablet (20 mg total) by  mouth daily.  Dispense: 90 tablet; Refill: 3  5. Hyperlipidemia, unspecified hyperlipidemia type  - CMP14+EGFR - Lipid panel  6. Tobacco abuse - CMP14+EGFR   Continue all  meds Labs pending Health Maintenance reviewed- PT to scheduled mammogram  Diet and exercise encouraged RTO 6 months  Evelina Dun, FNP

## 2017-11-28 LAB — ANEMIA PROFILE B
Basophils Absolute: 0 10*3/uL (ref 0.0–0.2)
Basos: 0 %
EOS (ABSOLUTE): 0.2 10*3/uL (ref 0.0–0.4)
Eos: 2 %
Ferritin: 8 ng/mL — ABNORMAL LOW (ref 15–150)
Folate: >20 ng/mL (ref >3.0)
Hematocrit: 31.2 % — ABNORMAL LOW (ref 34.0–46.6)
Hemoglobin: 9.8 g/dL — ABNORMAL LOW (ref 11.1–15.9)
Immature Grans (Abs): 0 10*3/uL (ref 0.0–0.1)
Immature Granulocytes: 0 %
Iron Saturation: 5 % — CL (ref 15–55)
Iron: 17 ug/dL — ABNORMAL LOW (ref 27–139)
Lymphocytes Absolute: 3 10*3/uL (ref 0.7–3.1)
Lymphs: 26 %
MCH: 23.5 pg — ABNORMAL LOW (ref 26.6–33.0)
MCHC: 31.4 g/dL — ABNORMAL LOW (ref 31.5–35.7)
MCV: 75 fL — ABNORMAL LOW (ref 79–97)
Monocytes Absolute: 0.6 10*3/uL (ref 0.1–0.9)
Monocytes: 5 %
Neutrophils Absolute: 7.6 10*3/uL — ABNORMAL HIGH (ref 1.4–7.0)
Neutrophils: 67 %
Platelets: 583 10*3/uL — ABNORMAL HIGH (ref 150–379)
RBC: 4.17 x10E6/uL (ref 3.77–5.28)
RDW: 18.1 % — ABNORMAL HIGH (ref 12.3–15.4)
Retic Ct Pct: 1.6 % (ref 0.6–2.6)
Total Iron Binding Capacity: 359 ug/dL (ref 250–450)
UIBC: 342 ug/dL (ref 118–369)
Vitamin B-12: 336 pg/mL (ref 232–1245)
WBC: 11.4 10*3/uL — ABNORMAL HIGH (ref 3.4–10.8)

## 2017-11-28 LAB — CMP14+EGFR
ALT: 13 IU/L (ref 0–32)
AST: 15 IU/L (ref 0–40)
Albumin/Globulin Ratio: 1.4 (ref 1.2–2.2)
Albumin: 4.4 g/dL (ref 3.6–4.8)
Alkaline Phosphatase: 125 IU/L — ABNORMAL HIGH (ref 39–117)
BUN/Creatinine Ratio: 9 — ABNORMAL LOW (ref 12–28)
BUN: 7 mg/dL — ABNORMAL LOW (ref 8–27)
Bilirubin Total: <0.2 mg/dL (ref 0.0–1.2)
CO2: 21 mmol/L (ref 20–29)
Calcium: 9.6 mg/dL (ref 8.7–10.3)
Chloride: 101 mmol/L (ref 96–106)
Creatinine, Ser: 0.77 mg/dL (ref 0.57–1.00)
GFR calc Af Amer: 96 mL/min/{1.73_m2} (ref >59)
GFR calc non Af Amer: 83 mL/min/{1.73_m2} (ref >59)
Globulin, Total: 3.2 g/dL (ref 1.5–4.5)
Glucose: 107 mg/dL — ABNORMAL HIGH (ref 65–99)
Potassium: 3.7 mmol/L (ref 3.5–5.2)
Sodium: 140 mmol/L (ref 134–144)
Total Protein: 7.6 g/dL (ref 6.0–8.5)

## 2017-11-28 LAB — LIPID PANEL
Chol/HDL Ratio: 9.4 ratio — ABNORMAL HIGH (ref 0.0–4.4)
Cholesterol, Total: 274 mg/dL — ABNORMAL HIGH (ref 100–199)
HDL: 29 mg/dL — ABNORMAL LOW (ref >39)
Triglycerides: 484 mg/dL — ABNORMAL HIGH (ref 0–149)

## 2017-11-30 ENCOUNTER — Other Ambulatory Visit: Payer: Self-pay | Admitting: Family

## 2017-11-30 DIAGNOSIS — E611 Iron deficiency: Secondary | ICD-10-CM

## 2017-11-30 MED ORDER — ROSUVASTATIN CALCIUM 10 MG PO TABS: 10.0000 mg | ORAL_TABLET | Freq: Every day | ORAL | 1 refills | Status: DC

## 2017-12-30 ENCOUNTER — Other Ambulatory Visit: Payer: Self-pay

## 2017-12-30 ENCOUNTER — Encounter (HOSPITAL_COMMUNITY): Payer: Self-pay | Admitting: Internal Medicine

## 2017-12-30 ENCOUNTER — Inpatient Hospital Stay (HOSPITAL_COMMUNITY): Payer: BLUE CROSS/BLUE SHIELD | Attending: Hematology | Admitting: Internal Medicine

## 2017-12-30 VITALS — BP 166/69 | HR 106 | Temp 98.7°F | Resp 16 | Ht 64.0 in | Wt 163.8 lb

## 2017-12-30 DIAGNOSIS — Z95820 Peripheral vascular angioplasty status with implants and grafts: Secondary | ICD-10-CM

## 2017-12-30 DIAGNOSIS — D509 Iron deficiency anemia, unspecified: Secondary | ICD-10-CM | POA: Diagnosis not present

## 2017-12-30 DIAGNOSIS — I10 Essential (primary) hypertension: Secondary | ICD-10-CM | POA: Diagnosis not present

## 2017-12-30 DIAGNOSIS — Z720 Tobacco use: Secondary | ICD-10-CM

## 2017-12-30 DIAGNOSIS — Z7901 Long term (current) use of anticoagulants: Secondary | ICD-10-CM

## 2017-12-30 DIAGNOSIS — Z95828 Presence of other vascular implants and grafts: Secondary | ICD-10-CM | POA: Diagnosis not present

## 2017-12-30 DIAGNOSIS — D473 Essential (hemorrhagic) thrombocythemia: Secondary | ICD-10-CM

## 2017-12-30 DIAGNOSIS — Z86718 Personal history of other venous thrombosis and embolism: Secondary | ICD-10-CM

## 2017-12-30 DIAGNOSIS — D508 Other iron deficiency anemias: Secondary | ICD-10-CM

## 2017-12-30 NOTE — Progress Notes (Signed)
Referring physician Evelina Dun nurse practitioner at [facility] family medicine Cc:  NP Evelina Dun Diagnosis Other iron deficiency anemia - Plan: CBC with Differential/Platelet, Comprehensive metabolic panel, Lactate dehydrogenase, Ferritin, Protein electrophoresis, serum  Staging Cancer Staging No matching staging information was found for the patient.  Assessment and Plan: 1.  IDA (iron deficiency anemia.).  62 year old female who has a history of DVT diagnosed in 2013.  She had IVC filter placed and underwent angioplasty, thrombolysis, thrombectomy.  She also had bilateral lower extremity stents placed.  She is currently on Xarelto.  She denies any blood in the stool or urine.  She has not undergone gastric bypass surgery.  At the time of her evaluation in 2013  for the extensive clot she was seen by GI and reportedly underwent endoscopy and colonoscopy.  The patient had labs done at her primary care physician's office on 11/26/2017 with white count 11.4 hemoglobin 9.8 platelets 583 her ferritin was 8 B12 336, folate normal.  She reports that she is a vegetarian.  She reports she has undergone mammogram evaluation.  Patient is seen today for consultation due to iron deficiency anemia.  Discussion held with the patient today concerning that labs that were done through her PCP on November 26, 2017 are diagnostic of iron deficiency anemia.  Her ferritin was noted to be decreased at 8.  Hemoglobin was 9.8.  She denies any blood in her stool or her urine.  She reports she has undergone endoscopy and colonoscopy in the past.  I have discussed with her usual etiology for IDA is blood loss.  She will be referred for GI evaluation.  She also reports she is a vegetarian so I have encouraged her to eat  iron rich foods.  She was given the option of intravenous iron but does not desire to take IV iron.  She is recommended for ferrous sulfate daily initially and will repeat labs to determine if she  has had a response to therapy.  She will return to clinic in 3 months for repeat labs.  We will also check SPEP at that time with ferritin, CBC CMP and LDH.  2.  Thrombocytosis.  Platelet count mildly elevated at 583,000.  I suspect this is reactive related to IDA.  Will repeat CBC on return to clinic in 3 months after iron replacement has begun.  3.  LLE DVT.  This was diagnosed in 2013.  She underwent  IVC filter placement and had angioplasty, thrombolysis, thrombectomy.  She also had bilateral lower extremity stents placed.  She is currently on Xarelto.  She reports she was told she had a positive lupus anticoagulant.  Review of records indicated this was suspected to be transient.  Review of chart indicate that labs done in 2013 showed no evidence of lupus anticoagulant.  She should continue Xarelto and has been recommended for lifetime anticoagulation due to the severity of her clot.  Continue to follow-up with PCP.  4.  Hypertension.  Blood pressure 166/69.  Follow-up with PCP.  5.  Smoking.  Cessation has been recommended.  She was also given option of lung cancer screening but does not desire to have that performed.  She should follow-up with PCP.  6.  Health maintenance.  She is recommended for GI evaluation especially due to iron deficiency anemia.  She should also continue mammogram screenings as recommended.  All questions answered and patient expressed understanding of the information presented.   HPI: 62 year old female who has a history of  DVT diagnosed in 2013.  She reports at that time she was told she had a positive lupus anticoagulant.  She had IVC filter placed and underwent angioplasty, thrombolysis, thrombectomy.  She also had bilateral lower extremity stents placed.  She is currently on Xarelto.  She denies any blood in the stool or urine.  She has not undergone gastric bypass surgery.  At the time of her evaluation in 2013  for the extensive clot she was seen by GI and  reportedly underwent endoscopy and colonoscopy.  The patient had labs done at her primary care physician's office on 11/26/2017 with white count 11.4 hemoglobin 9.8 platelets 583 her ferritin was 8, B12 336, folate normal.  She reports that she is a vegetarian.  She reports she has undergone mammogram evaluation.  Patient is seen today for consultation due to iron deficiency anemia.   Problem List Patient Active Problem List   Diagnosis Date Noted  . GERD (gastroesophageal reflux disease) [K21.9] 12/25/2016  . Microcytic anemia [D50.9] 01/24/2016  . Hypertriglyceridemia [E78.1] 01/24/2016  . HLD (hyperlipidemia) [E78.5] 06/21/2015  . History of DVT of lower extremity [Z86.718]   . Edema of left lower extremity [R60.0]   . Hypercoagulation syndrome ([place]) [D68.59] 05/19/2012  . Iron deficiency [E61.1] 05/19/2012  . DVT, recurrent, lower extremity, acute- history of LLE DVT 04/2012 [I82.409] 05/19/2012  . Thrombocytosis ([place]) [D47.3] 05/19/2012  . Sinus tachycardia [R00.0] 04/21/2012  . Pulmonary embolism, bilateral- moderate clot burden [I26.99] 04/20/2012  . Tobacco abuse [Z72.0] 04/20/2012  . HTN (hypertension) [I10] 04/20/2012    Past Medical History Past Medical History:  Diagnosis Date  . Back pain   . DVT (deep venous thrombosis) ([place])   . Hypertension   . Lupus anticoagulant syndrome ([place])   . Pulmonary embolism [facility])     Past Surgical History Past Surgical History:  Procedure Laterality Date  . BACK SURGERY    . COLONOSCOPY  05/19/2012   Procedure: COLONOSCOPY;  Surgeon: Beryle Beams, MD;  Location: [place];  Service: Endoscopy;  Laterality: N/A;  . ESOPHAGOGASTRODUODENOSCOPY  05/19/2012   Procedure: ESOPHAGOGASTRODUODENOSCOPY (EGD);  Surgeon: Beryle Beams, MD;  Location: [facility] ENDOSCOPY;  Service: Endoscopy;  Laterality: N/A;    Family History Family History  Problem Relation Age of Onset  . Leukemia Mother   . Prostate cancer Father      Social History   reports that she has been smoking cigarettes.  She has a 14.50 pack-year smoking history. She has never used smokeless tobacco. She reports that she drinks alcohol. She reports that she does not use drugs.  Medications  Current Outpatient Medications:  .  lisinopril (PRINIVIL,ZESTRIL) 20 MG tablet, Take 1 tablet (20 mg total) by mouth daily., Disp: 90 tablet, Rfl: 1 .  rivaroxaban (XARELTO) 20 MG TABS tablet, Take 1 tablet (20 mg total) by mouth daily., Disp: 90 tablet, Rfl: 3 .  rosuvastatin (CRESTOR) 10 MG tablet, Take 1 tablet (10 mg total) by mouth daily., Disp: 90 tablet, Rfl: 1 .  acetaminophen (TYLENOL) 325 MG tablet, Take 650 mg by mouth every 6 (six) hours as needed for mild pain., Disp: , Rfl:  .  diphenhydramine-acetaminophen (TYLENOL PM) 25-500 MG TABS tablet, Take 1 tablet by mouth at bedtime as needed (sleep)., Disp: , Rfl:  .  pantoprazole (PROTONIX) 40 MG tablet, Take 1 tablet (40 mg total) by mouth daily. (Patient not taking: Reported on 12/30/2017), Disp: 90 tablet, Rfl: 1  Allergies Prednisone and Tramadol  Review of Systems Review  of Systems - Oncology ROS as per HPI otherwise 12 point ROS is negative.   Physical Exam  Vitals Wt Readings from Last 3 Encounters:  12/30/17 163 lb 12.8 oz (74.3 kg)  11/26/17 165 lb (74.8 kg)  12/25/16 160 lb 12.8 oz (72.9 kg)   Temp Readings from Last 3 Encounters:  12/30/17 98.7 F (37.1 C) (Oral)  11/26/17 97.8 F (36.6 C) (Oral)  12/25/16 97.7 F (36.5 C) (Oral)   BP Readings from Last 3 Encounters:  12/30/17 (!) 166/69  11/26/17 (!) 161/79  12/25/16 (!) 190/92   Pulse Readings from Last 3 Encounters:  12/30/17 (!) 106  11/26/17 99  12/25/16 99    Constitutional: Well-developed, well-nourished, and in no distress.   HENT: Head: Normocephalic and atraumatic.  Mouth/Throat: No oropharyngeal exudate. Mucosa moist. Eyes: Pupils are equal, round, and reactive to light. Conjunctivae are normal. No scleral icterus.   Neck: Normal range of motion. Neck supple. No JVD present.  Cardiovascular: Normal rate, regular rhythm and normal heart sounds.  Exam reveals no gallop and no friction rub.   No murmur heard. Pulmonary/Chest: Effort normal and breath sounds normal. No respiratory distress. No wheezes.No rales.  Abdominal: Soft. Bowel sounds are normal. No distension. There is no tenderness. There is no guarding.  Musculoskeletal: No edema or tenderness.  Lymphadenopathy: No cervical, axillary or supraclavicular adenopathy.  Neurological: Alert and oriented to person, place, and time. No cranial nerve deficit.  Skin: Skin is warm and dry. No rash noted. No erythema. No pallor.  Psychiatric: Affect and judgment normal.   Labs No visits with results within 3 Day(s) from this visit.  Latest known visit with results is:  Office Visit on 11/26/2017  Component Date Value Ref Range Status  . Total Iron Binding Capacity 11/26/2017 359  250 - 450 ug/dL Final  . UIBC 11/26/2017 342  118 - 369 ug/dL Final  . Iron 11/26/2017 17* 27 - 139 ug/dL Final  . Iron Saturation 11/26/2017 5* 15 - 55 % Final  . Ferritin 11/26/2017 8* 15 - 150 ng/mL Final  . Vitamin B-12 11/26/2017 336  232 - 1,245 pg/mL Final  . Folate 11/26/2017 >20.0  >3.0 ng/mL Final   Comment: A serum folate concentration of less than 3.1 ng/mL is considered to represent clinical deficiency.   . WBC 11/26/2017 11.4* 3.4 - 10.8 x10E3/uL Final  . RBC 11/26/2017 4.17  3.77 - 5.28 x10E6/uL Final  . Hemoglobin 11/26/2017 9.8* 11.1 - 15.9 g/dL Final  . Hematocrit 11/26/2017 31.2* 34.0 - 46.6 % Final  . MCV 11/26/2017 75* 79 - 97 fL Final  . MCH 11/26/2017 23.5* 26.6 - 33.0 pg Final  . MCHC 11/26/2017 31.4* 31.5 - 35.7 g/dL Final  . RDW 11/26/2017 18.1* 12.3 - 15.4 % Final  . Platelets 11/26/2017 583* 150 - 379 x10E3/uL Final  . Neutrophils 11/26/2017 67  Not Estab. % Final  . Lymphs 11/26/2017 26  Not Estab. % Final  . Monocytes 11/26/2017 5  Not  Estab. % Final  . Eos 11/26/2017 2  Not Estab. % Final  . Basos 11/26/2017 0  Not Estab. % Final  . Neutrophils Absolute 11/26/2017 7.6* 1.4 - 7.0 x10E3/uL Final  . Lymphocytes Absolute 11/26/2017 3.0  0.7 - 3.1 x10E3/uL Final  . Monocytes Absolute 11/26/2017 0.6  0.1 - 0.9 x10E3/uL Final  . EOS (ABSOLUTE) 11/26/2017 0.2  0.0 - 0.4 x10E3/uL Final  . Basophils Absolute 11/26/2017 0.0  0.0 - 0.2 x10E3/uL Final  . Immature  Granulocytes 11/26/2017 0  Not Estab. % Final  . Immature Grans (Abs) 11/26/2017 0.0  0.0 - 0.1 x10E3/uL Final  . Retic Ct Pct 11/26/2017 1.6  0.6 - 2.6 % Final  . Glucose 11/26/2017 107* 65 - 99 mg/dL Final  . BUN 11/26/2017 7* 8 - 27 mg/dL Final  . Creatinine, Ser 11/26/2017 0.77  0.57 - 1.00 mg/dL Final  . GFR calc non Af Amer 11/26/2017 83  >59 mL/min/1.73 Final  . GFR calc Af Amer 11/26/2017 96  >59 mL/min/1.73 Final  . BUN/Creatinine Ratio 11/26/2017 9* 12 - 28 Final  . Sodium 11/26/2017 140  134 - 144 mmol/L Final  . Potassium 11/26/2017 3.7  3.5 - 5.2 mmol/L Final  . Chloride 11/26/2017 101  96 - 106 mmol/L Final  . CO2 11/26/2017 21  20 - 29 mmol/L Final  . Calcium 11/26/2017 9.6  8.7 - 10.3 mg/dL Final  . Total Protein 11/26/2017 7.6  6.0 - 8.5 g/dL Final  . Albumin 11/26/2017 4.4  3.6 - 4.8 g/dL Final  . Globulin, Total 11/26/2017 3.2  1.5 - 4.5 g/dL Final  . Albumin/Globulin Ratio 11/26/2017 1.4  1.2 - 2.2 Final  . Bilirubin Total 11/26/2017 <0.2  0.0 - 1.2 mg/dL Final  . Alkaline Phosphatase 11/26/2017 125* 39 - 117 IU/L Final  . AST 11/26/2017 15  0 - 40 IU/L Final  . ALT 11/26/2017 13  0 - 32 IU/L Final  . Cholesterol, Total 11/26/2017 274* 100 - 199 mg/dL Final  . Triglycerides 11/26/2017 484* 0 - 149 mg/dL Final  . HDL 11/26/2017 29* >39 mg/dL Final  . VLDL Cholesterol Cal 11/26/2017 Comment  5 - 40 mg/dL Final   Comment: The calculation for the VLDL cholesterol is not valid when triglyceride level is >400 mg/dL.   Marland Kitchen LDL Calculated 11/26/2017  Comment  0 - 99 mg/dL Final   Comment: Triglyceride result indicated is too high for an accurate LDL cholesterol estimation.   . Chol/HDL Ratio 11/26/2017 9.4* 0.0 - 4.4 ratio Final   Comment:                                   T. Chol/HDL Ratio                                             Men  Women                               1/2 Avg.Risk  3.4    3.3                                   Avg.Risk  5.0    4.4                                2X Avg.Risk  9.6    7.1                                3X Avg.Risk 23.4   11.0      Pathology Orders Placed This Encounter  Procedures  . CBC with Differential/Platelet    Standing Status:   Future    Standing Expiration Date:   12/31/2018  . Comprehensive metabolic panel    Standing Status:   Future    Standing Expiration Date:   12/31/2018  . Lactate dehydrogenase    Standing Status:   Future    Standing Expiration Date:   12/31/2018  . Ferritin    Standing Status:   Future    Standing Expiration Date:   12/31/2018  . Protein electrophoresis, serum    Standing Status:   Future    Standing Expiration Date:   12/31/2018       Zoila Shutter MD

## 2017-12-31 ENCOUNTER — Encounter (HOSPITAL_COMMUNITY): Payer: Self-pay | Admitting: Internal Medicine

## 2017-12-31 NOTE — Progress Notes (Signed)
When patient was asked her preference for GI provider, she refused referral and stated she "had no plans on going for an appointment" and not to send a referral.

## 2018-03-25 ENCOUNTER — Other Ambulatory Visit (HOSPITAL_COMMUNITY): Payer: BLUE CROSS/BLUE SHIELD

## 2018-04-01 ENCOUNTER — Ambulatory Visit (HOSPITAL_COMMUNITY): Payer: BLUE CROSS/BLUE SHIELD | Admitting: Internal Medicine

## 2018-07-07 DIAGNOSIS — Z029 Encounter for administrative examinations, unspecified: Secondary | ICD-10-CM

## 2018-07-12 ENCOUNTER — Other Ambulatory Visit: Payer: Self-pay | Admitting: Family

## 2018-07-20 ENCOUNTER — Other Ambulatory Visit: Payer: Self-pay | Admitting: Family

## 2018-07-20 DIAGNOSIS — I10 Essential (primary) hypertension: Secondary | ICD-10-CM

## 2018-07-20 NOTE — Telephone Encounter (Signed)
Last seen 11/26/17

## 2018-07-31 DIAGNOSIS — H10013 Acute follicular conjunctivitis, bilateral: Secondary | ICD-10-CM | POA: Diagnosis not present

## 2018-09-08 ENCOUNTER — Encounter: Payer: Self-pay | Admitting: Family Medicine

## 2018-09-08 ENCOUNTER — Ambulatory Visit (INDEPENDENT_AMBULATORY_CARE_PROVIDER_SITE_OTHER): Payer: BLUE CROSS/BLUE SHIELD | Admitting: Family Medicine

## 2018-09-08 VITALS — BP 160/82 | HR 102 | Temp 98.7°F | Ht 64.0 in | Wt 157.0 lb

## 2018-09-08 DIAGNOSIS — Z86718 Personal history of other venous thrombosis and embolism: Secondary | ICD-10-CM | POA: Diagnosis not present

## 2018-09-08 DIAGNOSIS — I10 Essential (primary) hypertension: Secondary | ICD-10-CM | POA: Diagnosis not present

## 2018-09-08 DIAGNOSIS — F17210 Nicotine dependence, cigarettes, uncomplicated: Secondary | ICD-10-CM | POA: Diagnosis not present

## 2018-09-08 DIAGNOSIS — Z86711 Personal history of pulmonary embolism: Secondary | ICD-10-CM | POA: Diagnosis not present

## 2018-09-08 DIAGNOSIS — Z7689 Persons encountering health services in other specified circumstances: Secondary | ICD-10-CM

## 2018-09-08 MED ORDER — LISINOPRIL 40 MG PO TABS: 40.0000 mg | ORAL_TABLET | Freq: Every day | ORAL | 3 refills | Status: DC

## 2018-09-08 NOTE — Patient Instructions (Signed)
Managing Your Hypertension Hypertension is commonly called high blood pressure. This is when the force of your blood pressing against the walls of your arteries is too strong. Arteries are blood vessels that carry blood from your heart throughout your body. Hypertension forces the heart to work harder to pump blood, and may cause the arteries to become narrow or stiff. Having untreated or uncontrolled hypertension can cause heart attack, stroke, kidney disease, and other problems. What are blood pressure readings? A blood pressure reading consists of a higher number over a lower number. Ideally, your blood pressure should be below 120/80. The first ("top") number is called the systolic pressure. It is a measure of the pressure in your arteries as your heart beats. The second ("bottom") number is called the diastolic pressure. It is a measure of the pressure in your arteries as the heart relaxes. What does my blood pressure reading mean? Blood pressure is classified into four stages. Based on your blood pressure reading, your health care provider may use the following stages to determine what type of treatment you need, if any. Systolic pressure and diastolic pressure are measured in a unit called mm Hg. Normal  Systolic pressure: below 120.  Diastolic pressure: below 80. Elevated  Systolic pressure: 120-129.  Diastolic pressure: below 80. Hypertension stage 1  Systolic pressure: 130-139.  Diastolic pressure: 80-89. Hypertension stage 2  Systolic pressure: 140 or above.  Diastolic pressure: 90 or above. What health risks are associated with hypertension? Managing your hypertension is an important responsibility. Uncontrolled hypertension can lead to:  A heart attack.  A stroke.  A weakened blood vessel (aneurysm).  Heart failure.  Kidney damage.  Eye damage.  Metabolic syndrome.  Memory and concentration problems. What changes can I make to manage my  hypertension? Hypertension can be managed by making lifestyle changes and possibly by taking medicines. Your health care provider will help you make a plan to bring your blood pressure within a normal range. Eating and drinking   Eat a diet that is high in fiber and potassium, and low in salt (sodium), added sugar, and fat. An example eating plan is called the DASH (Dietary Approaches to Stop Hypertension) diet. To eat this way: ? Eat plenty of fresh fruits and vegetables. Try to fill half of your plate at each meal with fruits and vegetables. ? Eat whole grains, such as whole wheat pasta, brown rice, or whole grain bread. Fill about one quarter of your plate with whole grains. ? Eat low-fat diary products. ? Avoid fatty cuts of meat, processed or cured meats, and poultry with skin. Fill about one quarter of your plate with lean proteins such as fish, chicken without skin, beans, eggs, and tofu. ? Avoid premade and processed foods. These tend to be higher in sodium, added sugar, and fat.  Reduce your daily sodium intake. Most people with hypertension should eat less than 1,500 mg of sodium a day.  Limit alcohol intake to no more than 1 drink a day for nonpregnant women and 2 drinks a day for men. One drink equals 12 oz of beer, 5 oz of wine, or 1 oz of hard liquor. Lifestyle  Work with your health care provider to maintain a healthy body weight, or to lose weight. Ask what an ideal weight is for you.  Get at least 30 minutes of exercise that causes your heart to beat faster (aerobic exercise) most days of the week. Activities may include walking, swimming, or biking.  Include exercise   to strengthen your muscles (resistance exercise), such as weight lifting, as part of your weekly exercise routine. Try to do these types of exercises for 30 minutes at least 3 days a week.  Do not use any products that contain nicotine or tobacco, such as cigarettes and e-cigarettes. If you need help quitting,  ask your health care provider.  Control any long-term (chronic) conditions you have, such as high cholesterol or diabetes. Monitoring  Monitor your blood pressure at home as told by your health care provider. Your personal target blood pressure may vary depending on your medical conditions, your age, and other factors.  Have your blood pressure checked regularly, as often as told by your health care provider. Working with your health care provider  Review all the medicines you take with your health care provider because there may be side effects or interactions.  Talk with your health care provider about your diet, exercise habits, and other lifestyle factors that may be contributing to hypertension.  Visit your health care provider regularly. Your health care provider can help you create and adjust your plan for managing hypertension. Will I need medicine to control my blood pressure? Your health care provider may prescribe medicine if lifestyle changes are not enough to get your blood pressure under control, and if:  Your systolic blood pressure is 130 or higher.  Your diastolic blood pressure is 80 or higher. Take medicines only as told by your health care provider. Follow the directions carefully. Blood pressure medicines must be taken as prescribed. The medicine does not work as well when you skip doses. Skipping doses also puts you at risk for problems. Contact a health care provider if:  You think you are having a reaction to medicines you have taken.  You have repeated (recurrent) headaches.  You feel dizzy.  You have swelling in your ankles.  You have trouble with your vision. Get help right away if:  You develop a severe headache or confusion.  You have unusual weakness or numbness, or you feel faint.  You have severe pain in your chest or abdomen.  You vomit repeatedly.  You have trouble breathing. Summary  Hypertension is when the force of blood pumping  through your arteries is too strong. If this condition is not controlled, it may put you at risk for serious complications.  Your personal target blood pressure may vary depending on your medical conditions, your age, and other factors. For most people, a normal blood pressure is less than 120/80.  Hypertension is managed by lifestyle changes, medicines, or both. Lifestyle changes include weight loss, eating a healthy, low-sodium diet, exercising more, and limiting alcohol. This information is not intended to replace advice given to you by your health care provider. Make sure you discuss any questions you have with your health care provider. Document Released: 04/28/2012 Document Revised: 07/02/2016 Document Reviewed: 07/02/2016 Elsevier Interactive Patient Education  2019 [employer].  Steps to Quit Smoking  Smoking tobacco can be bad for your health. It can also affect almost every organ in your body. Smoking puts you and people around you at risk for many serious long-lasting (chronic) diseases. Quitting smoking is hard, but it is one of the best things that you can do for your health. It is never too late to quit. What are the benefits of quitting smoking? When you quit smoking, you lower your risk for getting serious diseases and conditions. They can include:  Lung cancer or lung disease.  Heart disease.  Stroke.  Heart attack.  Not being able to have children (infertility).  Weak bones (osteoporosis) and broken bones (fractures). If you have coughing, wheezing, and shortness of breath, those symptoms may get better when you quit. You may also get sick less often. If you are pregnant, quitting smoking can help to lower your chances of having a baby of low birth weight. What can I do to help me quit smoking? Talk with your doctor about what can help you quit smoking. Some things you can do (strategies) include:  Quitting smoking totally, instead of slowly cutting back how much you  smoke over a period of time.  Going to in-person counseling. You are more likely to quit if you go to many counseling sessions.  Using resources and support systems, such as: ? Database administrator with a Social worker. ? Phone quitlines. ? Careers information officer. ? Support groups or group counseling. ? Text messaging programs. ? Mobile phone apps or applications.  Taking medicines. Some of these medicines may have nicotine in them. If you are pregnant or breastfeeding, do not take any medicines to quit smoking unless your doctor says it is okay. Talk with your doctor about counseling or other things that can help you. Talk with your doctor about using more than one strategy at the same time, such as taking medicines while you are also going to in-person counseling. This can help make quitting easier. What things can I do to make it easier to quit? Quitting smoking might feel very hard at first, but there is a lot that you can do to make it easier. Take these steps:  Talk to your family and friends. Ask them to support and encourage you.  Call phone quitlines, reach out to support groups, or work with a Social worker.  Ask people who smoke to not smoke around you.  Avoid places that make you want (trigger) to smoke, such as: ? Bars. ? Parties. ? Smoke-break areas at work.  Spend time with people who do not smoke.  Lower the stress in your life. Stress can make you want to smoke. Try these things to help your stress: ? Getting regular exercise. ? Deep-breathing exercises. ? Yoga. ? Meditating. ? Doing a body scan. To do this, close your eyes, focus on one area of your body at a time from head to toe, and notice which parts of your body are tense. Try to relax the muscles in those areas.  Download or buy apps on your mobile phone or tablet that can help you stick to your quit plan. There are many free apps, such as QuitGuide from the [employer] Office manager for Disease Control and Prevention). You can  find more support from smokefree.gov and other websites. This information is not intended to replace advice given to you by your health care provider. Make sure you discuss any questions you have with your health care provider. Document Released: 05/31/2009 Document Revised: 04/01/2016 Document Reviewed: 12/19/2014 Elsevier Interactive Patient Education  2019 [employer].  Venous Thromboembolism Prevention Venous thromboembolism (VTE) is a condition in which a blood clot (thrombus) develops in the body. A thrombus usually occurs in a deep vein in the leg or the pelvis (DVT), but it can also occur in the arm. Sometimes, pieces of a thrombus can break off from its original place of development and travel through the bloodstream to other parts of the body. When that happens, the thrombus is called an embolus. An embolus that travels to one or both lungs  is called a pulmonary embolism. An embolism can block the blood flow in the blood vessels of other organs as well. VTE is a serious health condition that can cause disability or death. It is very important to get help right away and to not ignore symptoms. How can a VTE be prevented?   Exercise regularly. Take a brisk 30 minute walk every day. Staying active and moving around can help you to prevent blood clots.  Avoid sitting or lying in bed for long periods of time without moving your legs. Change your position often, especially during long-distance travel (over 4 hours).  If you are a woman who is over 87 years of age, avoid unnecessary use of medicines that contain estrogen. These include birth control pills and hormone replacement therapy.  Do not smoke, especially if you take estrogen medicines. If you need help quitting, ask your health care provider.  Eat plenty of fruits and vegetables. Ask your health care provider or dietitian if there are foods that you should avoid.  Maintain a weight that is appropriate for your height. Ask your  health care provider what weight is healthy for you.  Wear loose-fitting clothing. Avoid constrictive or tight clothing around your legs or waist.  Try not to bump or injure your legs. Avoid crossing your legs when you are sitting.  Do not use pillows under your knees while lying down unless told by your health care provider.  Wear support hose (compression stockings or TED hose) as told by your health care provider. Compression stockings increase blood flow in your legs and can help prevent blood clots. Do not let them bunch up when you are wearing them. How can I prevent VTE when I travel? Long-distance travel (over 4 hours) can increase the risk of a VTE. To prevent VTE when traveling:  Exercise your legs every hour by standing, stretching, and bending and straightening your legs. If you are traveling by airplane, train, or bus, walk up and down the aisle as often as possible to get your blood moving. If you are traveling by car, stop and get out of the car every hour to exercise your legs and stretch. Other types of exercise might include: ? Keeping your feet flat on the ground and raising your toes. ? Switching from tightening the muscles in your calves and thighs to relaxing those same muscles while you are sitting. ? Pointing and flexing your feet at the ankle joints while you are sitting.  Stay well hydrated while traveling. Drink enough water to keep your urine clear or pale yellow.  Avoid drinking alcohol during long travel. Generally, it is not recommended that you take medicines to prevent DVT during routine travel. How can VTE be prevented if I am hospitalized? A VTE may be prevented by taking medicines that are prescribed to prevent blood clots (anticoagulants). You can also help to prevent VTE while in the hospital by taking these actions:  Get out of bed and walk. Ask your health care provider if this is safe for you to do.  Request the use of a sequential compression device  (SCD). This is a machine that pumps air into compression sleeves that are wrapped around your legs.  Request the use of compression stockings, which are tight, elastic stockings that apply pressure to the lower legs. Compression stockings are sometimes used with SCDs. How can I prevent VTE after surgery? Understand that there is an increased risk for VTE for the first 4-6 weeks after surgery.  During this time:  Avoid long-distance travel (over 4 hours). If you must travel during this time, ask your health care provider about additional preventive actions that you can take. These might include exercising your arms and legs every hour while you travel.  Avoid sitting or lying still for too long. If possible, get up and walk around one time every hour. Ask your health care provider when this is safe for you to do. Get help right away if:  You have new or increased pain, swelling, or redness in an arm or leg.  You have numbness or tingling in an arm or leg.  You have shortness of breath while active or at rest.  You have chest pain.  You have a rapid or irregular heartbeat.  You feel light-headed or dizzy.  You cough up blood.  You notice blood in your vomit, bowel movement, or urine. These symptoms may represent a serious problem that is an emergency. Do not wait to see if the symptoms will go away. Get medical help right away. Call your local emergency services (911 in the [place] Do not drive yourself to the hospital. This information is not intended to replace advice given to you by your health care provider. Make sure you discuss any questions you have with your health care provider. Document Released: 07/23/2009 Document Revised: 03/20/2017 Document Reviewed: 11/29/2014 Elsevier Interactive Patient Education  2019 [employer].

## 2018-09-08 NOTE — Progress Notes (Signed)
Patient presents to clinic today to f/u on chronic conditions and establish care.  SUBJECTIVE: PMH:  Pt is a 63 yo female with pmh sig for HTN, HLD, h/o PE, H/o DVTs, HLD, nicotine use.  Pt previously seen at [facility] by Evelina Dun, FNP.  H/o multiple blood clots: -pt had spinal fusion in 2014.  A few days after procedure developed LLE edema, the RLE edema -DVTs noted. -also had PEs.  States was not expected to live. -IVC filter placed, removed 2 months later -has stents in LEs -started on warfarin -changed to xarelto.   -denies LE, SOB, CP -endorses always being cold since being on blood thinner  HTN: -checking bp at home, typically 140s/80s -on lisinopril 20 mg -states dose was increased at one time, but she never got the new rx for the increased dose. -denies HAs, cough  HLD: -was on crestor 10 mg -not currently on meds -last lipid panel 11/26/17  H/o anemia: -taking OTC iron supplement  Allergies: Tramadol-"sick to stomach"  Social history: Patient is married.  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  Patient has a Oceanographer in education.  Pt has 2 children.  Pt's son died of spina bifida after multiple surgeries.  Pt endorses alcohol tobacco.  Pt denies drug use.  Pt endorses smoking maybe 2 cigarettes/day.  States she placed most of the cigarette, may have 2 puffs.  Health Maintenance: Immunizations --influenza vaccine 2019 Colonoscopy --2014 Mammogram --2014 PAP -- 2014 LMP--2014  Family medical history: Mom-deceased, macular degeneration Dad-deceased, prostate cancer Brother-CM, alive son-Jacob, deceased-spina bifida Daughter-Alive MGM-deceased, stroke MGF-deceased, lung cancer-worked in a factory PGM-deceased, leukemia? PGF-deceased, cancer?,  Used chewing tobacco  Past Medical History:  Diagnosis Date  . Back pain   . DVT (deep venous thrombosis) ([place])   . Hypertension   . Lupus  anticoagulant syndrome ([place])   . Pulmonary embolism [facility])     Past Surgical History:  Procedure Laterality Date  . BACK SURGERY    . COLONOSCOPY  05/19/2012   Procedure: COLONOSCOPY;  Surgeon: Beryle Beams, MD;  Location: [facility];  Service: Endoscopy;  Laterality: N/A;  . ESOPHAGOGASTRODUODENOSCOPY  05/19/2012   Procedure: ESOPHAGOGASTRODUODENOSCOPY (EGD);  Surgeon: Beryle Beams, MD;  Location[facility] ENDOSCOPY;  Service: Endoscopy;  Laterality: N/A;    Current Outpatient Medications on File Prior to Visit  Medication Sig Dispense Refill  . acetaminophen (TYLENOL) 325 MG tablet Take 650 mg by mouth every 6 (six) hours as needed for mild pain.    . diphenhydramine-acetaminophen (TYLENOL PM) 25-500 MG TABS tablet Take 1 tablet by mouth at bedtime as needed (sleep).    Marland Kitchen lisinopril (PRINIVIL,ZESTRIL) 20 MG tablet TAKE 1 TABLET BY MOUTH EVERY DAY 90 tablet 0  . rivaroxaban (XARELTO) 20 MG TABS tablet Take 1 tablet (20 mg total) by mouth daily. 90 tablet 3   No current facility-administered medications on file prior to visit.     Allergies  Allergen Reactions  . Prednisone Other (See Comments)    No energy "stalls out".    Makes pt  Jittery.  . Tramadol Other (See Comments)    Constipation,  Makes pt  Jittery.    Family History  Problem Relation Age of Onset  . Leukemia Mother   . Prostate cancer Father     Social History   Socioeconomic History  . Marital status: Married    Spouse name: Not on file  . Number of  children: Not on file  . Years of education: Not on file  . Highest education level: Not on file  Occupational History  . Not on file  Social Needs  . Financial resource strain: Not on file  . Food insecurity:    Worry: Not on file    Inability: Not on file  . Transportation needs:    Medical: Not on file    Non-medical: Not on file  Tobacco Use  . Smoking status: Current Every Day Smoker    Packs/day: 0.50    Years: 29.00    Pack years: 14.50     Types: Cigarettes  . Smokeless tobacco: Never Used  Substance and Sexual Activity  . Alcohol use: Yes    Comment: Occasional  . Drug use: No  . Sexual activity: Not on file  Lifestyle  . Physical activity:    Days per week: Not on file    Minutes per session: Not on file  . Stress: Not on file  Relationships  . Social connections:    Talks on phone: Not on file    Gets together: Not on file    Attends religious service: Not on file    Active member of club or organization: Not on file    Attends meetings of clubs or organizations: Not on file    Relationship status: Not on file  . Intimate partner violence:    Fear of current or ex partner: Not on file    Emotionally abused: Not on file    Physically abused: Not on file    Forced sexual activity: Not on file  Other Topics Concern  . Not on file  Social History Narrative  . Not on file    ROS General: Denies fever, chills, night sweats, changes in weight, changes in appetite HEENT: Denies headaches, ear pain, changes in vision, rhinorrhea, sore throat CV: Denies CP, palpitations, SOB, orthopnea Pulm: Denies SOB, cough, wheezing GI: Denies abdominal pain, nausea, vomiting, diarrhea, constipation GU: Denies dysuria, hematuria, frequency, vaginal discharge Msk: Denies muscle cramps, joint pains Neuro: Denies weakness, numbness, tingling Skin: Denies rashes, bruising Psych: Denies depression, anxiety, hallucinations  BP (!) 160/82   Pulse (!) 102   Temp 98.7 F (37.1 C) (Oral)   Ht 5\' 4"  (1.626 m)   Wt 157 lb (71.2 kg)   SpO2 98%   BMI 26.95 kg/m   Physical Exam Gen. Pleasant, well developed, well-nourished, in NAD HEENT - [place]/AT, PERRL, no scleral icterus, no nasal drainage Lungs: no use of accessory muscles, CTAB, no wheezes, rales or rhonchi Cardiovascular: RRR, No r/g/m, no peripheral edema Abdomen: BS present, soft, nontender,nondistended Neuro:  A&Ox3, CN II-XII intact, normal gait Skin:  Warm, dry, intact,  no lesions  No results found for this or any previous visit (from the past 2160 hour(s)).  Assessment/Plan: Essential hypertension  -Uncontrolled -Discussed increasing dose of lisinopril from 20 mg to 40 mg daily -Patient to start taking two 20 mg tabs.  When out of pills can pick up new rx for lisinopril 40 mg. -continue lifestyle changes.  Discussed quitting smoking. - Plan: lisinopril (PRINIVIL,ZESTRIL) 40 MG tablet  History of DVT (deep vein thrombosis) -Continue Xarelto 20 mg daily -pt to monitor for s/s of DVT including LE edema  History of pulmonary embolism -Continue Xarelto 20 mg daily -pt to monitor for s/s of PE including cough, shortness of breath tachycardia  Cigarette nicotine dependence without complication -Smoking cessation counseling greater than 3 minutes, less than 10 minutes -  Patient smoking maybe 2 cigarettes/day.  Discussed cutting down -Patient considering -Given handouts -We will readdress at each visit  Encounter to establish care -We reviewed the PMH, PSH, FH, SH, Meds and Allergies. -We provided refills for any medications we will prescribe as needed. -We addressed current concerns per orders and patient instructions. -We have asked for records for pertinent exams, studies, vaccines and notes from previous providers. -We have advised patient to follow up per instructions below.    F/u prn in the next month for BP  Grier Mitts, MD

## 2018-09-09 ENCOUNTER — Encounter: Payer: Self-pay | Admitting: Family Medicine

## 2018-09-09 DIAGNOSIS — Z7901 Long term (current) use of anticoagulants: Secondary | ICD-10-CM | POA: Diagnosis not present

## 2018-09-09 DIAGNOSIS — I214 Non-ST elevation (NSTEMI) myocardial infarction: Secondary | ICD-10-CM | POA: Diagnosis not present

## 2018-09-09 DIAGNOSIS — I219 Acute myocardial infarction, unspecified: Secondary | ICD-10-CM

## 2018-09-09 DIAGNOSIS — R0789 Other chest pain: Secondary | ICD-10-CM | POA: Diagnosis not present

## 2018-09-09 DIAGNOSIS — R079 Chest pain, unspecified: Secondary | ICD-10-CM | POA: Diagnosis not present

## 2018-09-09 DIAGNOSIS — I491 Atrial premature depolarization: Secondary | ICD-10-CM | POA: Diagnosis not present

## 2018-09-09 DIAGNOSIS — R000 Tachycardia, unspecified: Secondary | ICD-10-CM | POA: Diagnosis not present

## 2018-09-09 DIAGNOSIS — Z79899 Other long term (current) drug therapy: Secondary | ICD-10-CM | POA: Diagnosis not present

## 2018-09-09 DIAGNOSIS — R7989 Other specified abnormal findings of blood chemistry: Secondary | ICD-10-CM | POA: Diagnosis not present

## 2018-09-09 HISTORY — DX: Acute myocardial infarction, unspecified: I21.9

## 2018-09-10 ENCOUNTER — Inpatient Hospital Stay (HOSPITAL_COMMUNITY)
Admission: AD | Admit: 2018-09-10 | Payer: Self-pay | Source: Other Acute Inpatient Hospital | Admitting: Internal Medicine

## 2018-09-10 DIAGNOSIS — R079 Chest pain, unspecified: Secondary | ICD-10-CM | POA: Diagnosis not present

## 2018-09-12 ENCOUNTER — Emergency Department (HOSPITAL_COMMUNITY): Payer: BLUE CROSS/BLUE SHIELD

## 2018-09-12 ENCOUNTER — Inpatient Hospital Stay (HOSPITAL_COMMUNITY)
Admission: EM | Admit: 2018-09-12 | Discharge: 2018-09-15 | DRG: 247 | Disposition: A | Discharge status: Home / Self Care | Payer: BLUE CROSS/BLUE SHIELD | Attending: Family Medicine | Admitting: Family Medicine

## 2018-09-12 ENCOUNTER — Other Ambulatory Visit: Payer: Self-pay

## 2018-09-12 DIAGNOSIS — Z888 Allergy status to other drugs, medicaments and biological substances status: Secondary | ICD-10-CM | POA: Diagnosis not present

## 2018-09-12 DIAGNOSIS — E876 Hypokalemia: Secondary | ICD-10-CM

## 2018-09-12 DIAGNOSIS — Z716 Tobacco abuse counseling: Secondary | ICD-10-CM

## 2018-09-12 DIAGNOSIS — I2510 Atherosclerotic heart disease of native coronary artery without angina pectoris: Secondary | ICD-10-CM | POA: Diagnosis not present

## 2018-09-12 DIAGNOSIS — Z955 Presence of coronary angioplasty implant and graft: Secondary | ICD-10-CM | POA: Diagnosis not present

## 2018-09-12 DIAGNOSIS — R0789 Other chest pain: Secondary | ICD-10-CM | POA: Diagnosis not present

## 2018-09-12 DIAGNOSIS — R778 Other specified abnormalities of plasma proteins: Secondary | ICD-10-CM

## 2018-09-12 DIAGNOSIS — D6862 Lupus anticoagulant syndrome: Secondary | ICD-10-CM | POA: Diagnosis present

## 2018-09-12 DIAGNOSIS — I10 Essential (primary) hypertension: Secondary | ICD-10-CM | POA: Diagnosis not present

## 2018-09-12 DIAGNOSIS — I214 Non-ST elevation (NSTEMI) myocardial infarction: Secondary | ICD-10-CM | POA: Diagnosis not present

## 2018-09-12 DIAGNOSIS — R7989 Other specified abnormal findings of blood chemistry: Secondary | ICD-10-CM

## 2018-09-12 DIAGNOSIS — R079 Chest pain, unspecified: Secondary | ICD-10-CM

## 2018-09-12 DIAGNOSIS — K219 Gastro-esophageal reflux disease without esophagitis: Secondary | ICD-10-CM | POA: Diagnosis not present

## 2018-09-12 DIAGNOSIS — Z720 Tobacco use: Secondary | ICD-10-CM | POA: Diagnosis not present

## 2018-09-12 DIAGNOSIS — Z86711 Personal history of pulmonary embolism: Secondary | ICD-10-CM | POA: Diagnosis not present

## 2018-09-12 DIAGNOSIS — Z79899 Other long term (current) drug therapy: Secondary | ICD-10-CM | POA: Diagnosis not present

## 2018-09-12 DIAGNOSIS — E785 Hyperlipidemia, unspecified: Secondary | ICD-10-CM | POA: Diagnosis not present

## 2018-09-12 DIAGNOSIS — Z7901 Long term (current) use of anticoagulants: Secondary | ICD-10-CM

## 2018-09-12 DIAGNOSIS — Z885 Allergy status to narcotic agent status: Secondary | ICD-10-CM | POA: Diagnosis not present

## 2018-09-12 DIAGNOSIS — R000 Tachycardia, unspecified: Secondary | ICD-10-CM | POA: Diagnosis not present

## 2018-09-12 DIAGNOSIS — I370 Nonrheumatic pulmonary valve stenosis: Secondary | ICD-10-CM | POA: Diagnosis not present

## 2018-09-12 DIAGNOSIS — F17210 Nicotine dependence, cigarettes, uncomplicated: Secondary | ICD-10-CM | POA: Diagnosis not present

## 2018-09-12 DIAGNOSIS — Z86718 Personal history of other venous thrombosis and embolism: Secondary | ICD-10-CM

## 2018-09-12 HISTORY — DX: Non-ST elevation (NSTEMI) myocardial infarction: I21.4

## 2018-09-12 LAB — BASIC METABOLIC PANEL
Anion gap: 10 (ref 5–15)
BUN: 8 mg/dL (ref 8–23)
CO2: 23 mmol/L (ref 22–32)
Calcium: 9.9 mg/dL (ref 8.9–10.3)
Chloride: 104 mmol/L (ref 98–111)
Creatinine, Ser: 0.7 mg/dL (ref 0.44–1.00)
GFR calc Af Amer: >60 mL/min (ref >60)
GFR calc non Af Amer: >60 mL/min (ref >60)
Glucose, Bld: 130 mg/dL — ABNORMAL HIGH (ref 70–99)
Potassium: 3 mmol/L — ABNORMAL LOW (ref 3.5–5.1)
Sodium: 137 mmol/L (ref 135–145)

## 2018-09-12 LAB — I-STAT TROPONIN, ED: Troponin i, poc: 0.27 ng/mL (ref 0.00–0.08)

## 2018-09-12 LAB — CBC WITH DIFFERENTIAL/PLATELET
Abs Immature Granulocytes: 0.04 10*3/uL (ref 0.00–0.07)
Basophils Absolute: 0.1 10*3/uL (ref 0.0–0.1)
Basophils Relative: 0 %
Eosinophils Absolute: 0.1 10*3/uL (ref 0.0–0.5)
Eosinophils Relative: 1 %
HCT: 43.4 % (ref 36.0–46.0)
Hemoglobin: 13.9 g/dL (ref 12.0–15.0)
Immature Granulocytes: 0 %
Lymphocytes Relative: 22 %
Lymphs Abs: 2.5 10*3/uL (ref 0.7–4.0)
MCH: 28.4 pg (ref 26.0–34.0)
MCHC: 32 g/dL (ref 30.0–36.0)
MCV: 88.8 fL (ref 80.0–100.0)
Monocytes Absolute: 0.7 10*3/uL (ref 0.1–1.0)
Monocytes Relative: 6 %
Neutro Abs: 8 10*3/uL — ABNORMAL HIGH (ref 1.7–7.7)
Neutrophils Relative %: 71 %
Platelets: 406 10*3/uL — ABNORMAL HIGH (ref 150–400)
RBC: 4.89 MIL/uL (ref 3.87–5.11)
RDW: 12.8 % (ref 11.5–15.5)
WBC: 11.4 10*3/uL — ABNORMAL HIGH (ref 4.0–10.5)
nRBC: 0 % (ref 0.0–0.2)

## 2018-09-12 LAB — PROTIME-INR
INR: 1.1
Prothrombin Time: 14.1 seconds (ref 11.4–15.2)

## 2018-09-12 LAB — TROPONIN I: Troponin I: 0.38 ng/mL (ref <0.03)

## 2018-09-12 LAB — MAGNESIUM: Magnesium: 2 mg/dL (ref 1.7–2.4)

## 2018-09-12 MED ORDER — ASPIRIN 81 MG PO CHEW: 81.0000 mg | CHEWABLE_TABLET | Freq: Once | ORAL | Status: DC

## 2018-09-12 MED ORDER — NITROGLYCERIN 0.4 MG SL SUBL
0.4000 mg | SUBLINGUAL_TABLET | SUBLINGUAL | Status: DC | PRN
  Administered 2018-09-12 – 2018-09-13 (×2): 0.4 mg via SUBLINGUAL
  Filled 2018-09-12 (×2): qty 1

## 2018-09-12 MED ORDER — HEPARIN (PORCINE) 25000 UT/250ML-% IV SOLN
1300.0000 [IU]/h | INTRAVENOUS | Status: DC
  Administered 2018-09-12: 900 [IU]/h via INTRAVENOUS
  Filled 2018-09-12: qty 250

## 2018-09-12 MED ORDER — METOPROLOL TARTRATE 25 MG PO TABS
25.0000 mg | ORAL_TABLET | Freq: Two times a day (BID) | ORAL | Status: DC
  Administered 2018-09-12 – 2018-09-14 (×5): 25 mg via ORAL
  Filled 2018-09-12 (×5): qty 1

## 2018-09-12 MED ORDER — ASPIRIN EC 81 MG PO TBEC
81.0000 mg | DELAYED_RELEASE_TABLET | Freq: Every day | ORAL | Status: DC
  Administered 2018-09-12 – 2018-09-15 (×4): 81 mg via ORAL
  Filled 2018-09-12 (×5): qty 1

## 2018-09-12 MED ORDER — ACETAMINOPHEN 325 MG PO TABS
650.0000 mg | ORAL_TABLET | Freq: Four times a day (QID) | ORAL | Status: DC | PRN
  Administered 2018-09-13 – 2018-09-14 (×3): 650 mg via ORAL
  Filled 2018-09-12 (×3): qty 2

## 2018-09-12 MED ORDER — LISINOPRIL 40 MG PO TABS
40.0000 mg | ORAL_TABLET | Freq: Every day | ORAL | Status: DC
  Administered 2018-09-13 – 2018-09-15 (×3): 40 mg via ORAL
  Filled 2018-09-12 (×3): qty 1

## 2018-09-12 MED ORDER — POTASSIUM CHLORIDE 20 MEQ/15ML (10%) PO SOLN
40.0000 meq | Freq: Once | ORAL | Status: AC
  Administered 2018-09-12: 40 meq via ORAL
  Filled 2018-09-12: qty 30

## 2018-09-12 MED ORDER — ONDANSETRON HCL 4 MG/2ML IJ SOLN: 4.0000 mg | Freq: Four times a day (QID) | INTRAMUSCULAR | Status: DC | PRN

## 2018-09-12 MED ORDER — SODIUM CHLORIDE 0.9% FLUSH
3.0000 mL | Freq: Two times a day (BID) | INTRAVENOUS | Status: DC
  Administered 2018-09-12 – 2018-09-13 (×2): 3 mL via INTRAVENOUS

## 2018-09-12 MED ORDER — ACETAMINOPHEN 650 MG RE SUPP: 650.0000 mg | Freq: Four times a day (QID) | RECTAL | Status: DC | PRN

## 2018-09-12 MED ORDER — NITROGLYCERIN 0.4 MG SL SUBL: 0.4000 mg | SUBLINGUAL_TABLET | SUBLINGUAL | Status: DC | PRN

## 2018-09-12 MED ORDER — NITROGLYCERIN 2 % TD OINT
1.0000 [in_us] | TOPICAL_OINTMENT | Freq: Four times a day (QID) | TRANSDERMAL | Status: DC
  Administered 2018-09-13 – 2018-09-15 (×9): 1 [in_us] via TOPICAL
  Filled 2018-09-12 (×2): qty 30

## 2018-09-12 MED ORDER — ONDANSETRON HCL 4 MG PO TABS
4.0000 mg | ORAL_TABLET | Freq: Four times a day (QID) | ORAL | Status: DC | PRN
  Administered 2018-09-12: 4 mg via ORAL
  Filled 2018-09-12: qty 1

## 2018-09-12 NOTE — Progress Notes (Addendum)
ANTICOAGULATION CONSULT NOTE - Initial Consult  Pharmacy Consult for Heparin Indication: Hypercoagulable state  Allergies  Allergen Reactions  . Prednisone Other (See Comments)    No energy "stalls out".    Makes pt  Jittery.  . Tramadol Other (See Comments)    Constipation,  Makes pt  Jittery.    Patient Measurements:   Heparin Dosing Weight: 71.2 kg  Vital Signs: BP: 162/85 (01/26 1900) Pulse Rate: 74 (01/26 1900)  Labs: Recent Labs    09/12/18 1613 09/12/18 1632  HGB 13.9  --   HCT 43.4  --   PLT 406*  --   LABPROT  --  14.1  INR  --  1.10  CREATININE 0.70  --     Estimated Creatinine Clearance: 69.7 mL/min (by C-G formula based on SCr of 0.7 mg/dL).   Medical History: Past Medical History:  Diagnosis Date  . Back pain   . DVT (deep venous thrombosis) ([place])   . Hypertension   . Lupus anticoagulant syndrome ([place])   . Pulmonary embolism [facility])     Assessment: 63 yo female on chronic anticoagulation for hypercoagulable state (lupus anticoagulant). Fraser Din takes Xarelto at home and needs to be anticoagulated with heparin for possible catheterization.   Goal of Therapy:  Heparin level 0.3-0.7 units/ml Monitor platelets by anticoagulation protocol: Yes   Plan:  Start heparin infusion at 900 units/hr Check anti-Xa level in 8 hours and daily while on heparin Continue to monitor H&H and platelets  Will also monitor aPTT since on Xarelto PTA  Alanda Slim, PharmD, [facility] Clinical Pharmacist Please see AMION for all Pharmacists' Contact Phone Numbers 09/12/2018, 7:19 PM

## 2018-09-12 NOTE — ED Notes (Signed)
Pt reports no pain at this time. Report given to [place], RN 6E

## 2018-09-12 NOTE — H&P (Signed)
History and Physical    [name] [name] PJA:[id number] DOB: 1956-03-15 DOA: 09/12/2018  PCP: Billie Ruddy, MD   Patient coming from: Home  I have personally briefly reviewed patient's old medical records in [place]  Chief Complaint: Chest pain  HPI: [name] [name] is a 63 y.o. female with medical history significant for HTN, Hx of PE/DVT, Lupus anticoagulant syndrome, and tobacco use presents the ED with chest pain.    Patient reports about 3 weeks of intermittent chest pressure radiating to her neck and shoulders which would occur at rest, but were mild in intensity.  Her symptoms worsened on 09/09/2018 and she went to [facility] ED.  Per her report she was told that her EKG looked okay but she had elevated troponins and was told that she was having a heart attack.  There was an attempt to transfer to either [facility] or [facility] however there were no beds available.  Due to no transfer or intervention, should decided to go home.    She says she did well since then until this morning when she woke from sleep with heavy pressure sensation across her chest with radiation to both of her shoulders and her neck associated with palpitations.  She denies any associated nausea, vomiting, diaphoresis, or dyspnea.  She has a history of PE and DVTs as well as hypercoagulable state secondary to lupus anticoagulant syndrome and is on Xarelto chronically (last dose around 8 PM on 09/11/2018).  She denies any history of heart disease in her family.  She is a current smoker of up to maybe 0.5 PPD.  She denies any alcohol or illicit drug use.  ED Course:  Initial vitals showed BP 185/79, pulse 101, RR 20, temp 98 Fahrenheit, SPO2 99% on room air.  Labs notable for WBC 11.4, hemoglobin 13.9, platelets 406, K 3.0, i-STAT troponin 0.27.  EKG showed sinus rhythm, rate 102 bpm, RSR' in V1-V2, slight ST depression V4-V6, ST changes new from prior.  2 view chest x-ray was without focal  consolidation, effusion, or edema.  Cardiology were consulted and recommended medical admission and that Xarelto be held for potential cardiac catheterization later this week.   Review of Systems: As per HPI otherwise 10 point review of systems negative.    Past Medical History:  Diagnosis Date  . Back pain   . DVT (deep venous thrombosis) ([place])   . Hypertension   . Lupus anticoagulant syndrome ([place])   . Pulmonary embolism [facility])     Past Surgical History:  Procedure Laterality Date  . BACK SURGERY    . COLONOSCOPY  05/19/2012   Procedure: COLONOSCOPY;  Surgeon: Beryle Beams, MD;  Location: [place];  Service: Endoscopy;  Laterality: N/A;  . ESOPHAGOGASTRODUODENOSCOPY  05/19/2012   Procedure: ESOPHAGOGASTRODUODENOSCOPY (EGD);  Surgeon: Beryle Beams, MD;  Location: [facility] ENDOSCOPY;  Service: Endoscopy;  Laterality: N/A;     reports that she has been smoking cigarettes. She has a 14.50 pack-year smoking history. She has never used smokeless tobacco. She reports current alcohol use. She reports that she does not use drugs.  Allergies  Allergen Reactions  . Prednisone Other (See Comments)    No energy "stalls out".    Makes pt  Jittery.  . Tramadol Other (See Comments)    Constipation,  Makes pt  Jittery.    Family History  Problem Relation Age of Onset  . Leukemia Mother   . Prostate cancer Father   . Cancer Father  Prior to Admission medications   Medication Sig Start Date End Date Taking? Authorizing Provider  acetaminophen (TYLENOL) 325 MG tablet Take 650 mg by mouth every 6 (six) hours as needed for mild pain.   Yes [provider]  Ferrous Sulfate (SLOW FE PO) Take 1 tablet by mouth daily.   Yes [provider]  lisinopril (PRINIVIL,ZESTRIL) 40 MG tablet Take 1 tablet (40 mg total) by mouth daily. 09/08/18  Yes Billie Ruddy, MD  rivaroxaban (XARELTO) 20 MG TABS tablet Take 1 tablet (20 mg total) by mouth daily. 11/26/17  Yes Sharion Balloon, FNP    Physical Exam: Vitals:   09/12/18 1550 09/12/18 1715 09/12/18 1900 09/12/18 2020  BP: (!) 185/79 (!) 160/73 (!) 162/85 (!) 162/92  Pulse: (!) 101 86 74 89  Resp: 20 14 15 16   Temp:    98 F (36.7 C)  TempSrc:    Oral  SpO2: 99% 100% 97% 96%  Weight:    69.3 kg    Constitutional: NAD, calm, resting supine in bed, intermittent chest pressure sensation Eyes: PERRL, lids and conjunctivae normal ENMT: Mucous membranes are moist. Posterior pharynx clear of any exudate or lesions. Normal dentition.  Neck: normal, supple, no masses. Respiratory: Faint basilar crackles. Normal respiratory effort. No accessory muscle use.  Cardiovascular: Regular rate and rhythm, no murmurs / rubs / gallops. No extremity edema. Abdomen: no tenderness, no masses palpated. No hepatosplenomegaly. Bowel sounds positive.  Musculoskeletal: no clubbing / cyanosis. No joint deformity upper and lower extremities. Good ROM, no contractures. Normal muscle tone.  Skin: no rashes, lesions, ulcers. No induration Neurologic: CN 2-12 grossly intact. Sensation intact, Strength 5/5 in all 4.  Psychiatric: Normal judgment and insight. Alert and oriented x 3. Normal mood.    Labs on Admission: I have personally reviewed following labs and imaging studies  CBC: Recent Labs  Lab 09/12/18 1613  WBC 11.4*  NEUTROABS 8.0*  HGB 13.9  HCT 43.4  MCV 88.8  PLT 244*   Basic Metabolic Panel: Recent Labs  Lab 09/12/18 1613  NA 137  K 3.0*  CL 104  CO2 23  GLUCOSE 130*  BUN 8  CREATININE 0.70  CALCIUM 9.9   GFR: Estimated Creatinine Clearance: 68.7 mL/min (by C-G formula based on SCr of 0.7 mg/dL). Liver Function Tests: No results for input(s): AST, ALT, ALKPHOS, BILITOT, PROT, ALBUMIN in the last 168 hours. No results for input(s): LIPASE, AMYLASE in the last 168 hours. No results for input(s): AMMONIA in the last 168 hours. Coagulation Profile: Recent Labs  Lab 09/12/18 1632  INR 1.10   Cardiac  Enzymes: No results for input(s): CKTOTAL, CKMB, CKMBINDEX, TROPONINI in the last 168 hours. BNP (last 3 results) No results for input(s): PROBNP in the last 8760 hours. HbA1C: No results for input(s): HGBA1C in the last 72 hours. CBG: No results for input(s): GLUCAP in the last 168 hours. Lipid Profile: No results for input(s): CHOL, HDL, LDLCALC, TRIG, CHOLHDL, LDLDIRECT in the last 72 hours. Thyroid Function Tests: No results for input(s): TSH, T4TOTAL, FREET4, T3FREE, THYROIDAB in the last 72 hours. Anemia Panel: No results for input(s): VITAMINB12, FOLATE, FERRITIN, TIBC, IRON, RETICCTPCT in the last 72 hours. Urine analysis:    Component Value Date/Time   COLORURINE YELLOW 05/22/2012 0254   APPEARANCEUR Clear 12/03/2015 1122   LABSPEC 1.007 05/22/2012 0254   PHURINE 6.0 05/22/2012 0254   GLUCOSEU Negative 12/03/2015 1122   HGBUR SMALL (A) 05/22/2012 0254   BILIRUBINUR Negative  12/03/2015 [place] 05/22/2012 0254   PROTEINUR Negative 12/03/2015 1122   PROTEINUR NEGATIVE 05/22/2012 0254   UROBILINOGEN negative 06/21/2015 1654   UROBILINOGEN 0.2 05/22/2012 0254   NITRITE Negative 12/03/2015 1122   NITRITE NEGATIVE 05/22/2012 0254   LEUKOCYTESUR Trace (A) 12/03/2015 1122    Radiological Exams on Admission: Dg Chest 2 View  Result Date: 09/12/2018 CLINICAL DATA:  Chest pain for several days EXAM: CHEST - 2 VIEW COMPARISON:  09/10/18 FINDINGS: Cardiac shadow is stable. Aortic calcifications are again seen. The lungs are clear bilaterally. No acute bony abnormality is noted. IMPRESSION: No acute abnormality noted.  No change from the prior exam. Electronically Signed   By: Inez Catalina M.D.   On: 09/12/2018 16:50    EKG: Independently reviewed. Sinus rhythm, rate 102 bpm, RSR' in V1-V2, slight ST depression V4-V6, ST changes new from prior.  Assessment/Plan Principal Problem:   Chest pain Active Problems:   Hypokalemia   Tobacco use   HTN  (hypertension)   History of pulmonary embolus (PE)  [name] [name] is a 64 y.o. female with medical history significant for HTN, Hx of PE/DVT, Lupus anticoagulant syndrome, and tobacco use presents the ED with chest pain.   Chest pain: Symptoms concerning for unstable angina.  She is having continued intermittent symptoms of chest pressure at rest.  -Aspirin ordered -Holding Xarelto, will start heparin anticoagulation -Trend troponins -Start Lopressor 25 mg p.o. twice daily -Add nitroglycerin ointment -Cardiology consulted, appreciate further recommendations   Hx of PE/DVTs in setting of hypercoagulable state due to lupus anticoagulant syndrome: Work-up in 2013 revealed positive lupus anticoagulant.  She has been on Xarelto for many years and requires indefinite anticoagulation. -Holding Xarelto, heparin per pharmacy as above  Hypertension: She is hypertensive on admission. -Continue lisinopril 40 mg -Add Lopressor 25 p.o. twice daily as above  Hypokalemia: K 3.0 on admission.  Repleting and checking magnesium.  Tobacco use: She reports smoking up to 0.5 PPD.  She is advised to cut back and eventually completely sensate from smoking.  DVT prophylaxis: Heparin gtt Code Status: Full code Family Communication: None present at bedside admission Disposition Plan: Pending further cardiac work-up Consults called: Cardiology Admission status: Observation   Zada Finders MD [facility] Pager [phone]  If 7PM-7AM, please contact night-coverage [email or web address]  09/12/2018, 8:40 PM

## 2018-09-12 NOTE — Consult Note (Signed)
CARDIOLOGY CONSULT  REQUESTING PROVIDER: Zada Finders  CC: Chest pain  HPI: [name] [name] is a 63yo w/ h/o lupus anticoagulant and h/o DVT/PE, on chronic OAC, h/o HTN but no significant prior h/o CAD or other cardiac problems has been admitted after presenting to ED w/ CP. Pt states she had an episode of CP for the first time about 6 days ago; it came on at rest, was midsternal pressure discomfort, without associated SOB, diaphoresis, or nausea. It lasted a few min before going away. It occurred again the next night; she presented to OSH ED and was told she might be having a heart attack. No beds were available for transfer, and pt ultimately discharged home. She had recurrence of pain again today; this episode lasted a little longer but again occurred at rest. She has not had any exertional sx. She is currently pain-free. Her trop is mildly elevated at 0.27; no acute EKG changes.  Past Medical History:  Diagnosis Date  . Back pain   . DVT (deep venous thrombosis) ([place])   . Hypertension   . Lupus anticoagulant syndrome ([place])   . Pulmonary embolism [facility])     Past Surgical History:  Procedure Laterality Date  . BACK SURGERY    . COLONOSCOPY  05/19/2012   Procedure: COLONOSCOPY;  Surgeon: Beryle Beams, MD;  Location: [place];  Service: Endoscopy;  Laterality: N/A;  . ESOPHAGOGASTRODUODENOSCOPY  05/19/2012   Procedure: ESOPHAGOGASTRODUODENOSCOPY (EGD);  Surgeon: Beryle Beams, MD;  Location[facility] ENDOSCOPY;  Service: Endoscopy;  Laterality: N/A;     Current Facility-Administered Medications:  .  acetaminophen (TYLENOL) tablet 650 mg, 650 mg, Oral, Q6H PRN **OR** acetaminophen (TYLENOL) suppository 650 mg, 650 mg, Rectal, Q6H PRN, Zada Finders R, MD .  aspirin EC tablet 81 mg, 81 mg, Oral, Daily, Patel, Vishal R, MD .  heparin ADULT infusion 100 units/mL (25000 units/278mL sodium chloride 0.45%), 900 Units/hr, Intravenous, Continuous, Corinda Gubler, RPH .  lisinopril  (PRINIVIL,ZESTRIL) tablet 40 mg, 40 mg, Oral, Daily, Posey Pronto, Vishal R, MD .  metoprolol tartrate (LOPRESSOR) tablet 25 mg, 25 mg, Oral, BID, Patel, Vishal R, MD .  nitroGLYCERIN (NITROGLYN) 2 % ointment 1 inch, 1 inch, Topical, Q6H, Patel, Vishal R, MD .  ondansetron (ZOFRAN) tablet 4 mg, 4 mg, Oral, Q6H PRN **OR** ondansetron (ZOFRAN) injection 4 mg, 4 mg, Intravenous, Q6H PRN, Posey Pronto, Vishal R, MD .  potassium chloride 20 MEQ/15ML (10%) solution 40 mEq, 40 mEq, Oral, Once, Patel, Vishal R, MD .  sodium chloride flush (NS) 0.9 % injection 3 mL, 3 mL, Intravenous, Q12H, Patel, Vishal R, MD Current Meds  Medication Sig  . acetaminophen (TYLENOL) 325 MG tablet Take 650 mg by mouth every 6 (six) hours as needed for mild pain.  . Ferrous Sulfate (SLOW FE PO) Take 1 tablet by mouth daily.  Marland Kitchen lisinopril (PRINIVIL,ZESTRIL) 40 MG tablet Take 1 tablet (40 mg total) by mouth daily.  . rivaroxaban (XARELTO) 20 MG TABS tablet Take 1 tablet (20 mg total) by mouth daily.    Allergies: Allergies  Allergen Reactions  . Prednisone Other (See Comments)    No energy "stalls out".    Makes pt  Jittery.  . Tramadol Other (See Comments)    Constipation,  Makes pt  Jittery.    Social History:  The patient  reports that she has been smoking cigarettes. She has a 14.50 pack-year smoking history. She has never used smokeless tobacco. She reports current alcohol use. She reports that  she does not use drugs.   Family History:  The patient's family history includes Cancer in her father; Leukemia in her mother; Prostate cancer in her father.   ROS:  Please see the history of present illness.     All other systems reviewed and negative.   PHYSICAL EXAM: BP (!) 162/92 (BP Location: Right Arm)   Pulse 89   Temp 98 F (36.7 C) (Oral)   Resp 16   Wt 69.3 kg   SpO2 96%   BMI 26.21 kg/m   General:  Well nourished, well developed, in no acute distress HEENT: normal Lymph: no adenopathy Neck: no JVD Endocrine:   No thryomegaly Vascular: No carotid bruits; DP pulses 2+ bilaterally  Cardiac:  normal S1, S2; RRR; no murmur  Lungs:  clear to auscultation bilaterally, no wheezing, rhonchi or rales  Abd: soft, nontender, no hepatomegaly  Ext: no edema Musculoskeletal:  No deformities Skin: warm and dry  Neuro:  CNs 2-12 intact, no focal abnormalities noted Psych:  Normal affect   Labs:   Lab Results  Component Value Date   WBC 11.4 (H) 09/12/2018   HGB 13.9 09/12/2018   HCT 43.4 09/12/2018   MCV 88.8 09/12/2018   PLT 406 (H) 09/12/2018   Recent Labs  Lab 09/12/18 1613  NA 137  K 3.0*  CL 104  CO2 23  BUN 8  CREATININE 0.70  CALCIUM 9.9  GLUCOSE 130*   No results for input(s): CKTOTAL, CKMB, TROPONINI in the last 72 hours. Troponin (Point of Care Test) Recent Labs    09/12/18 1611  TROPIPOC 0.27*    Lab Results  Component Value Date   CHOL 274 (H) 11/26/2017   HDL 29 (L) 11/26/2017   LDLCALC Comment 11/26/2017   TRIG 484 (H) 11/26/2017   No results found for: DDIMER    EKG:  ST, lateral TWI  CARDIAC HISTORY:  ASSESSMENT AND PLAN:   1. CP: appears to be NSTEMI; trop is abnormal at 0.27; pt is pain free on heparin gtt. Will plan for [facility] for further evaluation. Could be plaque rupture vs spontaneous thrombus (primary or embolic) due to hypercoagulable state. Will get routine TTE  2. HTN: cont home regimen and adjust PRN  3. Lupus anticoagulant: hold xarelto for now. On heparin gtt currently  Thank you for the opportunity to participate in the care of this patient.  For questions or updates, please contact [place] Please consult [email or web address] for contact info under   Rudean Curt, MD , [facility] 09/12/18 8:25 PM

## 2018-09-12 NOTE — ED Triage Notes (Addendum)
Pt reports that she was seen at [facility] on Thursday they told the patient that she had a heart attack per her "blood work" and was discharged home on Friday afternoon. Pt reports today she started having the same pain down her ears and down her back like she had prior to her heart attack on Thursday. Pt denies any pain at this time.

## 2018-09-12 NOTE — ED Provider Notes (Addendum)
[facility] EMERGENCY DEPARTMENT Provider Note   CSN: [id number] Arrival date & time: 09/12/18  1540     History   Chief Complaint Chief Complaint  Patient presents with  . Shoulder Pain    HPI [name] [name] is a 63 y.o. female.  63 y/o female smoker with a PMH of DVT, HTN, PE presents to the ED with a chief complaint of chest pain x few weeks. Patient describes the pain as "pressure" located in the center of her chest. She reports this pain radiates from the middle of her chest to her neck sometimes. Patient called EMS on Thursday, they did an EKG on patient and she was told it was normal. On Friday patient woke up from her sleep with the chest pressure and had husband drive her to [facility]. Patient reports her pain is improved with deep inspiration, nitroglycerin patch and aspirin. Patient does have a previous history of stents to bilateral legs due to clots after back surgery. She denies any shortness of breath, dizziness, lightheaded, diaphoresis or nausea.  Patient reports compliance with Xarelto and her BP medications.   The history is provided by the patient.  Chest Pain  Pain location:  Substernal area Pain quality: pressure   Pain radiates to:  Neck Pain severity:  Mild Onset quality:  Gradual Duration:  3 weeks Timing:  Sporadic Progression:  Unchanged Chronicity:  Recurrent Context: at rest   Relieved by:  Nothing Worsened by:  Nothing Ineffective treatments:  None tried Associated symptoms: no abdominal pain, no altered mental status, no anxiety, no back pain, no cough, no diaphoresis, no fever, no nausea, no palpitations, no shortness of breath and no vomiting   Risk factors: hypertension, prior DVT/PE and smoking   Risk factors: no aortic disease, no coronary artery disease and no diabetes mellitus     Past Medical History:  Diagnosis Date  . Back pain   . DVT (deep venous thrombosis) ([place])   . Hypertension   . Lupus anticoagulant  syndrome ([place])   . Pulmonary embolism [facility])     Patient Active Problem List   Diagnosis Date Noted  . GERD (gastroesophageal reflux disease) 12/25/2016  . Microcytic anemia 01/24/2016  . Hypertriglyceridemia 01/24/2016  . HLD (hyperlipidemia) 06/21/2015  . History of DVT of lower extremity   . Edema of left lower extremity   . Hypercoagulation syndrome ([place]) 05/19/2012  . Iron deficiency 05/19/2012  . DVT, recurrent, lower extremity, acute- history of LLE DVT 04/2012 05/19/2012  . Thrombocytosis ([place]) 05/19/2012  . Sinus tachycardia 04/21/2012  . Pulmonary embolism, bilateral- moderate clot burden 04/20/2012  . Tobacco abuse 04/20/2012  . HTN (hypertension) 04/20/2012    Past Surgical History:  Procedure Laterality Date  . BACK SURGERY    . COLONOSCOPY  05/19/2012   Procedure: COLONOSCOPY;  Surgeon: Beryle Beams, MD;  Location: [facility];  Service: Endoscopy;  Laterality: N/A;  . ESOPHAGOGASTRODUODENOSCOPY  05/19/2012   Procedure: ESOPHAGOGASTRODUODENOSCOPY (EGD);  Surgeon: Beryle Beams, MD;  Location: [facility] ENDOSCOPY;  Service: Endoscopy;  Laterality: N/A;     OB History   No obstetric history on file.      Home Medications    Prior to Admission medications   Medication Sig Start Date End Date Taking? Authorizing Provider  acetaminophen (TYLENOL) 325 MG tablet Take 650 mg by mouth every 6 (six) hours as needed for mild pain.   Yes [provider]  Ferrous Sulfate (SLOW FE PO) Take 1 tablet by  mouth daily.   Yes [provider]  lisinopril (PRINIVIL,ZESTRIL) 40 MG tablet Take 1 tablet (40 mg total) by mouth daily. 09/08/18  Yes Billie Ruddy, MD  rivaroxaban (XARELTO) 20 MG TABS tablet Take 1 tablet (20 mg total) by mouth daily. 11/26/17  Yes Hawks, Theador Hawthorne, FNP    Family History Family History  Problem Relation Age of Onset  . Leukemia Mother   . Prostate cancer Father   . Cancer Father     Social History Social History   Tobacco Use    . Smoking status: Current Every Day Smoker    Packs/day: 0.50    Years: 29.00    Pack years: 14.50    Types: Cigarettes  . Smokeless tobacco: Never Used  Substance Use Topics  . Alcohol use: Yes    Comment: Occasional  . Drug use: No     Allergies   Prednisone and Tramadol   Review of Systems Review of Systems  Constitutional: Negative for chills, diaphoresis and fever.  HENT: Negative for ear pain, rhinorrhea and sore throat.   Eyes: Negative for pain and visual disturbance.  Respiratory: Negative for cough, shortness of breath and wheezing.   Cardiovascular: Positive for chest pain. Negative for palpitations.  Gastrointestinal: Negative for abdominal pain, nausea and vomiting.  Genitourinary: Negative for dysuria, flank pain and hematuria.  Musculoskeletal: Negative for arthralgias and back pain.  Skin: Negative for color change and rash.  Neurological: Negative for seizures and syncope.  All other systems reviewed and are negative.    Physical Exam Updated Vital Signs BP (!) 160/73   Pulse 86   Resp 14   SpO2 100%   Physical Exam Vitals signs and nursing note reviewed.  Constitutional:      General: She is not in acute distress.    Appearance: She is well-developed. She is not ill-appearing, toxic-appearing or diaphoretic.  HENT:     Head: Normocephalic and atraumatic.     Mouth/Throat:     Pharynx: No oropharyngeal exudate.  Eyes:     Pupils: Pupils are equal, round, and reactive to light.  Neck:     Musculoskeletal: Normal range of motion.  Cardiovascular:     Rate and Rhythm: Normal rate and regular rhythm.     Pulses:          Radial pulses are 2+ on the right side and 2+ on the left side.       Dorsalis pedis pulses are 2+ on the right side and 2+ on the left side.     Heart sounds: Normal heart sounds. No murmur. No friction rub.  Pulmonary:     Effort: Pulmonary effort is normal. No respiratory distress.     Breath sounds: Normal breath sounds.   Abdominal:     General: Bowel sounds are normal. There is no distension.     Palpations: Abdomen is soft.     Tenderness: There is no abdominal tenderness.  Musculoskeletal:        General: No tenderness or deformity.     Right lower leg: No edema.     Left lower leg: No edema.  Skin:    General: Skin is warm and dry.  Neurological:     Mental Status: She is alert and oriented to person, place, and time.     Comments: Alert, oriented, thought content appropriate. Speech fluent without evidence of aphasia. Able to follow 2 step commands without difficulty.  Cranial Nerves:  II:  Peripheral visual  fields grossly normal, pupils, round, reactive to light III,IV, VI: ptosis not present, extra-ocular motions intact bilaterally  V,VII: smile symmetric, facial light touch sensation equal VIII: hearing grossly normal bilaterally  IX,X: midline uvula rise  XI: bilateral shoulder shrug equal and strong XII: midline tongue extension  Motor:  5/5 in upper and lower extremities bilaterally including strong and equal grip strength and dorsiflexion/plantar flexion Sensory: light touch normal in all extremities.  Cerebellar: normal finger-to-nose with bilateral upper extremities, pronator drift negative Gait: normal gait and balance       ED Treatments / Results  Labs (all labs ordered are listed, but only abnormal results are displayed) Labs Reviewed  BASIC METABOLIC PANEL - Abnormal; Notable for the following components:      Result Value   Potassium 3.0 (*)    Glucose, Bld 130 (*)    All other components within normal limits  CBC WITH DIFFERENTIAL/PLATELET - Abnormal; Notable for the following components:   WBC 11.4 (*)    Platelets 406 (*)    Neutro Abs 8.0 (*)    All other components within normal limits  I-STAT TROPONIN, ED - Abnormal; Notable for the following components:   Troponin i, poc 0.27 (*)    All other components within normal limits  PROTIME-INR  URINALYSIS, ROUTINE  W REFLEX MICROSCOPIC    EKG EKG Interpretation  Date/Time:  Sunday September 12 2018 15:50:02 EST Ventricular Rate:  100 PR Interval:    QRS Duration: 90 QT Interval:  307 QTC Calculation: 396 R Axis:   40 Text Interpretation:  Sinus tachycardia RSR' in V1 or V2, right VCD or RVH Repol abnrm, severe global ischemia (LM/MVD) No significant change since last tracing Confirmed by Gareth Morgan [phone]) on 09/12/2018 3:55:06 PM   Radiology Dg Chest 2 View  Result Date: 09/12/2018 CLINICAL DATA:  Chest pain for several days EXAM: CHEST - 2 VIEW COMPARISON:  09/10/18 FINDINGS: Cardiac shadow is stable. Aortic calcifications are again seen. The lungs are clear bilaterally. No acute bony abnormality is noted. IMPRESSION: No acute abnormality noted.  No change from the prior exam. Electronically Signed   By: Inez Catalina M.D.   On: 09/12/2018 16:50    Procedures Procedures (including critical care time)  Medications Ordered in ED Medications  nitroGLYCERIN (NITROSTAT) SL tablet 0.4 mg (has no administration in time range)     Initial Impression / Assessment and Plan / ED Course  I have reviewed the triage vital signs and the nursing notes.  Pertinent labs & imaging results that were available during my care of the patient were reviewed by me and considered in my medical decision making (see chart for details).    Presents with pain which has been going on for a few weeks, seen at [facility] on Thursday, January 24, she was told that he had a heart attack but she will be transferred to [facility] or [place] however due to bed unavailability she was sent home from eating [facility].  I have personally reviewed all 20 pages of patient discharge papers and see her lowest troponin was 0.04 at midnight and then continue to increase having her latest troponin on discharge be 0.09.  Into records patient was refusing to go to [facility], they tried to transfer her to [facility] or [facility] however there were no beds  available for transfer.  Her chest x-ray showed no acute process.  Patient was told that she would have an appointment set up with cardiology scheduled for tomorrow.  I am unable to find record of who this cardiologist that she will be seen tomorrow, however they are part of [facility].  4:46 PM CBC slight elevation on WBC' hemoglobin stable. BP showed creatine level within normal limits. First troponin was 0.27, a called has been placed to [facility]. PT/INR is in therapeutic range. DG Chest showed no acute process.  5:46 PMSpoke to cardiology who advised patient to be admitted to medicine service as they are unable to cath her tomorrow due to [place]. Cardiology to see patient in consultation.    6:08 PM Spoke to hospitalist Dr. Posey Pronto who will evaluate patient in the ED and admit for elevated troponin and unstable angina.   Final Clinical Impressions(s) / ED Diagnoses   Final diagnoses:  Atypical chest pain  Elevated troponin    ED Discharge Orders    None       Janeece Fitting, PA-C 09/12/18 1808    Janeece Fitting, PA-C 09/12/18 1810    Julianne Rice, MD 09/13/18 2311

## 2018-09-13 ENCOUNTER — Ambulatory Visit: Payer: BLUE CROSS/BLUE SHIELD | Admitting: Cardiology

## 2018-09-13 ENCOUNTER — Observation Stay (HOSPITAL_BASED_OUTPATIENT_CLINIC_OR_DEPARTMENT_OTHER): Payer: BLUE CROSS/BLUE SHIELD

## 2018-09-13 ENCOUNTER — Encounter (HOSPITAL_COMMUNITY)
Admission: EM | Disposition: A | Discharge status: Home / Self Care | Payer: Self-pay | Source: Home / Self Care | Attending: Internal Medicine

## 2018-09-13 DIAGNOSIS — Z955 Presence of coronary angioplasty implant and graft: Secondary | ICD-10-CM | POA: Diagnosis not present

## 2018-09-13 DIAGNOSIS — R7989 Other specified abnormal findings of blood chemistry: Secondary | ICD-10-CM | POA: Diagnosis not present

## 2018-09-13 DIAGNOSIS — Z888 Allergy status to other drugs, medicaments and biological substances status: Secondary | ICD-10-CM | POA: Diagnosis not present

## 2018-09-13 DIAGNOSIS — K219 Gastro-esophageal reflux disease without esophagitis: Secondary | ICD-10-CM | POA: Diagnosis present

## 2018-09-13 DIAGNOSIS — I214 Non-ST elevation (NSTEMI) myocardial infarction: Secondary | ICD-10-CM | POA: Diagnosis not present

## 2018-09-13 DIAGNOSIS — Z7901 Long term (current) use of anticoagulants: Secondary | ICD-10-CM | POA: Diagnosis not present

## 2018-09-13 DIAGNOSIS — Z720 Tobacco use: Secondary | ICD-10-CM | POA: Diagnosis not present

## 2018-09-13 DIAGNOSIS — E785 Hyperlipidemia, unspecified: Secondary | ICD-10-CM | POA: Diagnosis not present

## 2018-09-13 DIAGNOSIS — E876 Hypokalemia: Secondary | ICD-10-CM | POA: Diagnosis present

## 2018-09-13 DIAGNOSIS — I370 Nonrheumatic pulmonary valve stenosis: Secondary | ICD-10-CM

## 2018-09-13 DIAGNOSIS — D6862 Lupus anticoagulant syndrome: Secondary | ICD-10-CM | POA: Diagnosis not present

## 2018-09-13 DIAGNOSIS — Z716 Tobacco abuse counseling: Secondary | ICD-10-CM | POA: Diagnosis not present

## 2018-09-13 DIAGNOSIS — Z79899 Other long term (current) drug therapy: Secondary | ICD-10-CM | POA: Diagnosis not present

## 2018-09-13 DIAGNOSIS — I10 Essential (primary) hypertension: Secondary | ICD-10-CM

## 2018-09-13 DIAGNOSIS — Z86718 Personal history of other venous thrombosis and embolism: Secondary | ICD-10-CM | POA: Diagnosis not present

## 2018-09-13 DIAGNOSIS — I2510 Atherosclerotic heart disease of native coronary artery without angina pectoris: Secondary | ICD-10-CM

## 2018-09-13 DIAGNOSIS — Z885 Allergy status to narcotic agent status: Secondary | ICD-10-CM | POA: Diagnosis not present

## 2018-09-13 DIAGNOSIS — F17210 Nicotine dependence, cigarettes, uncomplicated: Secondary | ICD-10-CM | POA: Diagnosis not present

## 2018-09-13 DIAGNOSIS — R0789 Other chest pain: Secondary | ICD-10-CM | POA: Diagnosis present

## 2018-09-13 DIAGNOSIS — R079 Chest pain, unspecified: Secondary | ICD-10-CM | POA: Diagnosis not present

## 2018-09-13 DIAGNOSIS — Z86711 Personal history of pulmonary embolism: Secondary | ICD-10-CM | POA: Diagnosis not present

## 2018-09-13 HISTORY — DX: Atherosclerotic heart disease of native coronary artery without angina pectoris: I25.10

## 2018-09-13 HISTORY — PX: LEFT HEART CATH AND CORONARY ANGIOGRAPHY: CATH118249

## 2018-09-13 HISTORY — PX: TRANSTHORACIC ECHOCARDIOGRAM: SHX275

## 2018-09-13 LAB — URINALYSIS, ROUTINE W REFLEX MICROSCOPIC
Bilirubin Urine: NEGATIVE
Glucose, UA: NEGATIVE mg/dL
Ketones, ur: NEGATIVE mg/dL
Leukocytes, UA: NEGATIVE
Nitrite: NEGATIVE
Protein, ur: NEGATIVE mg/dL
Specific Gravity, Urine: 1.019 (ref 1.005–1.030)
pH: 5 (ref 5.0–8.0)

## 2018-09-13 LAB — LIPID PANEL
Cholesterol: 308 mg/dL — ABNORMAL HIGH (ref 0–200)
HDL: 32 mg/dL — ABNORMAL LOW (ref >40)
LDL Cholesterol: 202 mg/dL — ABNORMAL HIGH (ref 0–99)
Total CHOL/HDL Ratio: 9.6 RATIO
Triglycerides: 371 mg/dL — ABNORMAL HIGH (ref <150)
VLDL: 74 mg/dL — ABNORMAL HIGH (ref 0–40)

## 2018-09-13 LAB — ECHOCARDIOGRAM COMPLETE: Weight: 2422.4 oz

## 2018-09-13 LAB — BASIC METABOLIC PANEL
Anion gap: 10 (ref 5–15)
BUN: 9 mg/dL (ref 8–23)
CO2: 23 mmol/L (ref 22–32)
Calcium: 9.2 mg/dL (ref 8.9–10.3)
Chloride: 104 mmol/L (ref 98–111)
Creatinine, Ser: 0.79 mg/dL (ref 0.44–1.00)
GFR calc Af Amer: >60 mL/min (ref >60)
GFR calc non Af Amer: >60 mL/min (ref >60)
Glucose, Bld: 99 mg/dL (ref 70–99)
Potassium: 4.5 mmol/L (ref 3.5–5.1)
Sodium: 137 mmol/L (ref 135–145)

## 2018-09-13 LAB — TROPONIN I
Troponin I: 0.54 ng/mL (ref <0.03)
Troponin I: 0.5 ng/mL (ref <0.03)

## 2018-09-13 LAB — CBC
HCT: 40 % (ref 36.0–46.0)
HCT: 39 % (ref 36.0–46.0)
Hemoglobin: 12.8 g/dL (ref 12.0–15.0)
Hemoglobin: 12.5 g/dL (ref 12.0–15.0)
MCH: 28.2 pg (ref 26.0–34.0)
MCH: 28.5 pg (ref 26.0–34.0)
MCHC: 32 g/dL (ref 30.0–36.0)
MCHC: 32.1 g/dL (ref 30.0–36.0)
MCV: 88.1 fL (ref 80.0–100.0)
MCV: 88.8 fL (ref 80.0–100.0)
Platelets: 369 10*3/uL (ref 150–400)
Platelets: 367 10*3/uL (ref 150–400)
RBC: 4.54 MIL/uL (ref 3.87–5.11)
RBC: 4.39 MIL/uL (ref 3.87–5.11)
RDW: 12.8 % (ref 11.5–15.5)
RDW: 12.8 % (ref 11.5–15.5)
WBC: 10 10*3/uL (ref 4.0–10.5)
WBC: 8.3 10*3/uL (ref 4.0–10.5)
nRBC: 0 % (ref 0.0–0.2)
nRBC: 0 % (ref 0.0–0.2)

## 2018-09-13 LAB — APTT
aPTT: 36 seconds (ref 24–36)
aPTT: 60 seconds — ABNORMAL HIGH (ref 24–36)

## 2018-09-13 LAB — HEPARIN LEVEL (UNFRACTIONATED): Heparin Unfractionated: 0.2 IU/mL — ABNORMAL LOW (ref 0.30–0.70)

## 2018-09-13 LAB — HIV ANTIBODY (ROUTINE TESTING W REFLEX): HIV Screen 4th Generation wRfx: NONREACTIVE

## 2018-09-13 SURGERY — LEFT HEART CATH AND CORONARY ANGIOGRAPHY
Anesthesia: LOCAL

## 2018-09-13 MED ORDER — SODIUM CHLORIDE 0.9% FLUSH: 3.0000 mL | INTRAVENOUS | Status: DC | PRN

## 2018-09-13 MED ORDER — SODIUM CHLORIDE 0.9 % IV SOLN
INTRAVENOUS | Status: DC
  Administered 2018-09-14: 11:00:00 via INTRAVENOUS

## 2018-09-13 MED ORDER — FENTANYL CITRATE (PF) 100 MCG/2ML IJ SOLN
INTRAMUSCULAR | Status: DC | PRN
  Administered 2018-09-13 (×3): 25 ug via INTRAVENOUS

## 2018-09-13 MED ORDER — SODIUM CHLORIDE 0.9 % IV SOLN: 250.0000 mL | INTRAVENOUS | Status: DC | PRN

## 2018-09-13 MED ORDER — ROSUVASTATIN CALCIUM 20 MG PO TABS
40.0000 mg | ORAL_TABLET | Freq: Every day | ORAL | Status: DC
  Administered 2018-09-13 – 2018-09-14 (×2): 40 mg via ORAL
  Filled 2018-09-13 (×2): qty 2

## 2018-09-13 MED ORDER — HEPARIN (PORCINE) IN NACL 1000-0.9 UT/500ML-% IV SOLN
INTRAVENOUS | Status: DC | PRN
  Administered 2018-09-13 (×2): 500 mL

## 2018-09-13 MED ORDER — IOHEXOL 350 MG/ML SOLN
INTRAVENOUS | Status: DC | PRN
  Administered 2018-09-13: 50 mL via INTRA_ARTERIAL

## 2018-09-13 MED ORDER — MIDAZOLAM HCL 2 MG/2ML IJ SOLN
INTRAMUSCULAR | Status: AC
  Filled 2018-09-13: qty 2

## 2018-09-13 MED ORDER — SODIUM CHLORIDE 0.9% FLUSH
3.0000 mL | Freq: Two times a day (BID) | INTRAVENOUS | Status: DC
  Administered 2018-09-13 – 2018-09-14 (×3): 3 mL via INTRAVENOUS

## 2018-09-13 MED ORDER — ASPIRIN 81 MG PO CHEW: 81.0000 mg | CHEWABLE_TABLET | ORAL | Status: DC

## 2018-09-13 MED ORDER — VERAPAMIL HCL 2.5 MG/ML IV SOLN
INTRAVENOUS | Status: DC | PRN
  Administered 2018-09-13: 10 mL via INTRA_ARTERIAL

## 2018-09-13 MED ORDER — LIDOCAINE HCL (PF) 1 % IJ SOLN
INTRAMUSCULAR | Status: AC
  Filled 2018-09-13: qty 30

## 2018-09-13 MED ORDER — SODIUM CHLORIDE 0.9 % WEIGHT BASED INFUSION: 1.0000 mL/kg/h | INTRAVENOUS | Status: DC

## 2018-09-13 MED ORDER — SODIUM CHLORIDE 0.9% FLUSH
3.0000 mL | Freq: Two times a day (BID) | INTRAVENOUS | Status: DC
  Administered 2018-09-14 (×2): 3 mL via INTRAVENOUS

## 2018-09-13 MED ORDER — FENTANYL CITRATE (PF) 100 MCG/2ML IJ SOLN
INTRAMUSCULAR | Status: AC
  Filled 2018-09-13: qty 2

## 2018-09-13 MED ORDER — MIDAZOLAM HCL 2 MG/2ML IJ SOLN
INTRAMUSCULAR | Status: DC | PRN
  Administered 2018-09-13: 2 mg via INTRAVENOUS

## 2018-09-13 MED ORDER — SODIUM CHLORIDE 0.9 % IV SOLN: INTRAVENOUS | Status: AC

## 2018-09-13 MED ORDER — LIDOCAINE HCL (PF) 1 % IJ SOLN
INTRAMUSCULAR | Status: DC | PRN
  Administered 2018-09-13: 2 mL via INTRADERMAL

## 2018-09-13 MED ORDER — HEPARIN (PORCINE) 25000 UT/250ML-% IV SOLN
1300.0000 [IU]/h | INTRAVENOUS | Status: DC
  Administered 2018-09-14: 1300 [IU]/h via INTRAVENOUS
  Filled 2018-09-13: qty 250

## 2018-09-13 MED ORDER — HEPARIN SODIUM (PORCINE) 1000 UNIT/ML IJ SOLN
INTRAMUSCULAR | Status: DC | PRN
  Administered 2018-09-13: 3500 [IU] via INTRAVENOUS

## 2018-09-13 MED ORDER — VERAPAMIL HCL 2.5 MG/ML IV SOLN
INTRAVENOUS | Status: AC
  Filled 2018-09-13: qty 2

## 2018-09-13 MED ORDER — SODIUM CHLORIDE 0.9 % WEIGHT BASED INFUSION
3.0000 mL/kg/h | INTRAVENOUS | Status: DC
  Administered 2018-09-13: 3 mL/kg/h via INTRAVENOUS

## 2018-09-13 MED ORDER — SODIUM CHLORIDE 0.9% FLUSH: 3.0000 mL | Freq: Two times a day (BID) | INTRAVENOUS | Status: DC

## 2018-09-13 SURGICAL SUPPLY — 11 items
CATH OPTITORQUE TIG 4.0 5F (CATHETERS) ×2 IMPLANT
DEVICE RAD COMP TR BAND LRG (VASCULAR PRODUCTS) ×2 IMPLANT
GLIDESHEATH SLEND A-KIT 6F 22G (SHEATH) ×2 IMPLANT
GUIDEWIRE INQWIRE 1.5J.035X260 (WIRE) ×1 IMPLANT
INQWIRE 1.5J .035X260CM (WIRE) ×2
KIT HEART LEFT (KITS) ×2 IMPLANT
PACK CARDIAC CATHETERIZATION (CUSTOM PROCEDURE TRAY) ×2 IMPLANT
SHEATH PROBE COVER 6X72 (BAG) ×2 IMPLANT
SYR MEDRAD MARK 7 150ML (SYRINGE) ×2 IMPLANT
TRANSDUCER W/STOPCOCK (MISCELLANEOUS) ×2 IMPLANT
TUBING CIL FLEX 10 FLL-RA (TUBING) ×2 IMPLANT

## 2018-09-13 NOTE — Progress Notes (Signed)
ANTICOAGULATION CONSULT NOTE - Follow Up Consult  Pharmacy Consult for heparin Indication: r/o NSTEMI and hypercoaguability  Labs: Recent Labs    09/12/18 1613 09/12/18 1632 09/12/18 2004 09/13/18 0144 09/13/18 0608 09/13/18 0957  HGB 13.9  --   --  12.8 12.5  --   HCT 43.4  --   --  40.0 39.0  --   PLT 406*  --   --  369 367  --   APTT  --   --   --  36  --  60*  LABPROT  --  14.1  --   --   --   --   INR  --  1.10  --   --   --   --   HEPARINUNFRC  --   --   --  0.20*  --   --   CREATININE 0.70  --   --   --  0.79  --   TROPONINI  --   --  0.38* 0.54* 0.50*  --     Assessment: 2 yoF admitted 09/12/18 on chronic anticoagulation (Xarelto) PTA for history of DVT/PEs and hypercoagulable state. Pharmacy has been consulted for heparin.   S/p cath lab, pharmacy asked to resume IV Heparin while decision is made for PCI vs. CABG.  Sheath out at 1628 pm.  No overt bleeding or complications noted.  Goal of Therapy:  aPTT 66-102 seconds   Plan:  - At 0030 on 1/28, resume IV Heparin at 1300 units/hr. -Check heparin level in 6 hrs. -Daily heparin level and CBC. -F/u plans  Marguerite Olea, [facility] Clinical Pharmacist Phone [phone]  09/13/2018 5:38 PM

## 2018-09-13 NOTE — H&P (View-Only) (Signed)
Progress Note  Patient Name: [name] [name] Date of Encounter: 09/13/2018  Primary Cardiologist: New   Subjective   Denies recurrent chest pain. Continues with NTG paste  She did note ~7/10 CP just prior to Attending evaluation - now 0/10 with NTG SL & change out of paste.  Inpatient Medications    Scheduled Meds: . aspirin EC  81 mg Oral Daily  . lisinopril  40 mg Oral Daily  . metoprolol tartrate  25 mg Oral BID  . nitroGLYCERIN  1 inch Topical Q6H  . sodium chloride flush  3 mL Intravenous Q12H   Continuous Infusions: . heparin 1,200 Units/hr (09/13/18 0500)   PRN Meds: acetaminophen **OR** acetaminophen, nitroGLYCERIN, ondansetron **OR** ondansetron (ZOFRAN) IV   Vital Signs    Vitals:   09/12/18 2145 09/13/18 0045 09/13/18 0115 09/13/18 0445  BP: 130/73 137/74 132/67 (!) 127/58  Pulse:      Resp:    19  Temp:    98.6 F (37 C)  TempSrc:    Oral  SpO2:    96%  Weight:    68.7 kg    Intake/Output Summary (Last 24 hours) at 09/13/2018 0816 Last data filed at 09/13/2018 0500 Gross per 24 hour  Intake 378.63 ml  Output -  Net 378.63 ml   Filed Weights   09/12/18 2020 09/13/18 0445  Weight: 69.3 kg 68.7 kg    Physical Exam   General: Well developed, well nourished, NAD Skin: Warm, dry, intact  Head: Normocephalic, atraumatic, clear, moist mucus membranes. Neck: Negative for carotid bruits. No JVD Lungs:Clear to ausculation bilaterally. No wheezes, rales, or rhonchi. Breathing is unlabored. Cardiovascular: RRR with S1 S2. No murmurs, rubs, gallops, or LV heave appreciated. Abdomen: Soft, non-tender, non-distended with normoactive bowel sounds. No obvious abdominal masses. MSK: Strength and tone appear normal for age. 5/5 in all extremities Extremities: No edema. No clubbing or cyanosis. DP/PT pulses 2+ bilaterally Neuro: Alert and oriented. No focal deficits. No facial asymmetry. MAE spontaneously. Psych: Responds to questions appropriately  with normal affect.    Labs    Chemistry Recent Labs  Lab 09/12/18 1613 09/13/18 0608  NA 137 137  K 3.0* 4.5  CL 104 104  CO2 23 23  GLUCOSE 130* 99  BUN 8 9  CREATININE 0.70 0.79  CALCIUM 9.9 9.2  GFRNONAA >60 >60  GFRAA >60 >60  ANIONGAP 10 10    Hematology Recent Labs  Lab 09/12/18 1613 09/13/18 0144 09/13/18 0608  WBC 11.4* 10.0 8.3  RBC 4.89 4.54 4.39  HGB 13.9 12.8 12.5  HCT 43.4 40.0 39.0  MCV 88.8 88.1 88.8  MCH 28.4 28.2 28.5  MCHC 32.0 32.0 32.1  RDW 12.8 12.8 12.8  PLT 406* 369 367   Cardiac Enzymes Recent Labs  Lab 09/12/18 2004 09/13/18 0144 09/13/18 0608  TROPONINI 0.38* 0.54* 0.50*    Recent Labs  Lab 09/12/18 1611  TROPIPOC 0.27*     BNPNo results for input(s): BNP, PROBNP in the last 168 hours.   DDimer No results for input(s): DDIMER in the last 168 hours.   Radiology    Dg Chest 2 View  Result Date: 09/12/2018 CLINICAL DATA:  Chest pain for several days EXAM: CHEST - 2 VIEW COMPARISON:  09/10/18 FINDINGS: Cardiac shadow is stable. Aortic calcifications are again seen. The lungs are clear bilaterally. No acute bony abnormality is noted. IMPRESSION: No acute abnormality noted.  No change from the prior exam. Electronically Signed   By:  Inez Catalina M.D.   On: 09/12/2018 16:50   Telemetry    09/13/2018 NSR- Personally Reviewed  ECG    No new tracing as of 09/13/2018- Personally Reviewed  Cardiac Studies   Echocardiogram 09/13/2018: Pending   Patient Profile     63 y.o. female w/ h/o lupus anticoagulant and h/o DVT/PE, on chronic OAC, h/o HTN but no significant prior h/o CAD or other cardiac problems has been admitted after presenting to ED w/ CP  Assessment & Plan    1.  NSTEMI: -Patient reports a 6 day period of intermittent chest pain with radiation to her neck and shoulders with no associated shortness of breath, diaphoresis or nausea which worsened on 09/09/2018.  Initial plan was for transfer from OSH to [facility] however  no beds were available. She then left and returned to [facility] after recurrent chest pain and pressure on Sunday   -Initial troponin found to be mildly elevated with at 0.27> 0.38> 0.54> 0.50 with no acute EKG changes -Creatinine stable, 0.79 -Currently chest pain-free -Plan for cardiac catheterization today, 09/13/2018 -Echocardiogram planned for today  -Continue ASA, lisinopril 40, metoprolol 25, NTG 1 inch paste -Continue Hep gtt for ACS  -- will need Statin (see FLP).  So you are as low usually overall  2. HTN: -Stable, 127/58, 132/67, 137/74 -Continue lisinopril 40, metoprolol 25  3.  History of DVT/PE: -Patient on home Xarelto>>> currently on hold -Hep gtt   4. Hx of lupus anticoagulant syndrome: -See above -Per primary team  5.  Tobacco use: -Smoking cessation strongly encouraged  Signed, [name] Drown NP-C [facility] Pager: [phone] 09/13/2018, 8:16 AM     ATTENDING ATTESTATION  I have seen, examined and evaluated the patient this AM (note signed in PM) along with [name] Drown, NP-C.  After reviewing all the available data and chart, we discussed the patients laboratory, study & physical findings as well as symptoms in detail. I agree with her findings, examination as well as impression recommendations as per our discussion.    Attending adjustments noted in italics.   [name] presents with acute coronary syndrome and elevated troponin consistent with non-ST elevation MI.  Plan is for cardiac catheterization today.  Her Xarelto has been on hold since Saturday night.  There was some question about whether she received last night, but she did not. She is on the Cath Lab scheduled for later on today.  Starting low-dose statin. Continue ACE inhibitor and beta-blocker. On aspirin and now IV heparin.  -With Xarelto underlying depression coagulant, if PCI is required, would need to use 1 month aspirin/Plavix plus Xarelto and then stop aspirin after 1 month.      [name] [name], M.D., M.S. Interventional Cardiologist   Pager # [phone] Phone # [phone] [address] [place], [place] [address]    For questions or updates, please contact   Please consult [email or web address] for contact info under Cardiology/STEMI.

## 2018-09-13 NOTE — Interval H&P Note (Signed)
History and Physical Interval Note:  09/13/2018 3:40 PM  [name] [name]  has presented today for surgery, with the diagnosis of Chest Pain  The various methods of treatment have been discussed with the patient and family. After consideration of risks, benefits and other options for treatment, the patient has consented to  Procedure(s): LEFT HEART CATH AND CORONARY ANGIOGRAPHY (N/A) with possible PERCUTANEOUS CORONARY INTERVENTION as a surgical intervention .  The patient's history has been reviewed, patient examined, no change in status, stable for surgery.  I have reviewed the patient's chart and labs.  Questions were answered to the patient's satisfaction.    Cath Lab Visit (complete for each Cath Lab visit)  Clinical Evaluation Leading to the Procedure:   ACS: Yes.    Non-ACS:    Anginal Classification: CCS IV  Anti-ischemic medical therapy: Minimal Therapy (1 class of medications)  Non-Invasive Test Results: No non-invasive testing performed  Prior CABG: No previous CABG    Glenetta Hew

## 2018-09-13 NOTE — Progress Notes (Signed)
PROGRESS NOTE  [name] [name] ATF:[id number] DOB: Aug 21, 1955 DOA: 09/12/2018 PCP: Billie Ruddy, MD   LOS: 0 days   Brief Narrative / Interim history: [name] [name] is a 63 y.o. female with medical history significant for HTN, Hx of PE/DVT, Lupus anticoagulant syndrome, and tobacco use presents the ED with chest pain.  This is been going on for the past 3 weeks.  She is a smoker.  Troponins were slightly elevated.  She was admitted to the hospital cardiology was consulted  Subjective: No chest pain this morning, however later in the morning developed some chest pain relieved with nitroglycerin  Assessment & Plan: Principal Problem:   Chest pain Active Problems:   Hypokalemia   Tobacco use   HTN (hypertension)   History of pulmonary embolus (PE)   Principal Problem Chest pain  -Concern for non-STEMI, cardiology plans for cardiac catheterization today -Continue aspirin, hold Xarelto and continue heparin infusion in preparation for the cath -Troponins increased to 0.5 and have remained flat -She was started on metoprolol  Active Problems History of PE/DVTs in the setting of hypercoagulable state due to lupus anticoagulant -She is on Xarelto indefinitely, will resume following cath  Hypertension -Continue lisinopril, blood pressure controlled -Lopressor was added  Hypokalemia -Potassium 3.0 on admission, this was repleted, repeat potassium was 4.5  Tobacco use -Counseled to stop  Hyperlipidemia -LDL 202, total cholesterol 308.  She will need to leave the hospital on a statin  Scheduled Meds: . [START ON 09/14/2018] aspirin  81 mg Oral Pre-Cath  . aspirin EC  81 mg Oral Daily  . lisinopril  40 mg Oral Daily  . metoprolol tartrate  25 mg Oral BID  . nitroGLYCERIN  1 inch Topical Q6H  . sodium chloride flush  3 mL Intravenous Q12H   Continuous Infusions: . sodium chloride 3 mL/kg/hr (09/13/18 1333)   Followed by  . sodium chloride    . heparin 1,300  Units/hr (09/13/18 1138)   PRN Meds:.acetaminophen **OR** acetaminophen, nitroGLYCERIN, ondansetron **OR** ondansetron (ZOFRAN) IV  DVT prophylaxis: Heparin infusion Code Status: Full code Family Communication: Husband present at bedside Disposition Plan: Home when cleared by cardiology  Consultants:   Cardiology  Procedures:   2D echo:  Study Conclusions - Left ventricle: The cavity size was normal. Systolic function was mildly reduced. The estimated ejection fraction was in the range of 45% to 50%. Diffuse hypokinesis. Doppler parameters are consistent with abnormal left ventricular relaxation (grade 1 diastolic dysfunction). Doppler parameters are consistent with high ventricular filling pressure. - Aortic valve: Trileaflet; mildly thickened, mildly calcified leaflets. - Mitral valve: Calcified annulus. There was trivial regurgitation. - Pulmonic valve: There was moderate regurgitation.  Antimicrobials:  None    Objective: Vitals:   09/13/18 0045 09/13/18 0115 09/13/18 0445 09/13/18 1026  BP: 137/74 132/67 (!) 127/58 118/66  Pulse:    77  Resp:   19   Temp:   98.6 F (37 C)   TempSrc:   Oral   SpO2:   96%   Weight:   68.7 kg     Intake/Output Summary (Last 24 hours) at 09/13/2018 1343 Last data filed at 09/13/2018 0500 Gross per 24 hour  Intake 378.63 ml  Output -  Net 378.63 ml   Filed Weights   09/12/18 2020 09/13/18 0445  Weight: 69.3 kg 68.7 kg    Examination:  Constitutional: NAD Eyes: PERRL, lids and conjunctivae normal ENMT: Mucous membranes are moist.  Neck: normal, supple Respiratory: clear to auscultation bilaterally,  no wheezing, no crackles. Normal respiratory effort. No accessory muscle use.  Cardiovascular: Regular rate and rhythm, no murmurs / rubs / gallops. No LE edema. 2+ pedal pulses. No carotid bruits.  Abdomen: no tenderness. Bowel sounds positive.  Musculoskeletal: no clubbing / cyanosis.   Skin: no rashes Neurologic: No focal  deficits   Data Reviewed: I have independently reviewed following labs and imaging studies   CBC: Recent Labs  Lab 09/12/18 1613 09/13/18 0144 09/13/18 0608  WBC 11.4* 10.0 8.3  NEUTROABS 8.0*  --   --   HGB 13.9 12.8 12.5  HCT 43.4 40.0 39.0  MCV 88.8 88.1 88.8  PLT 406* 369 250   Basic Metabolic Panel: Recent Labs  Lab 09/12/18 1613 09/12/18 2004 09/13/18 0608  NA 137  --  137  K 3.0*  --  4.5  CL 104  --  104  CO2 23  --  23  GLUCOSE 130*  --  99  BUN 8  --  9  CREATININE 0.70  --  0.79  CALCIUM 9.9  --  9.2  MG  --  2.0  --    GFR: Estimated Creatinine Clearance: 68.5 mL/min (by C-G formula based on SCr of 0.79 mg/dL). Liver Function Tests: No results for input(s): AST, ALT, ALKPHOS, BILITOT, PROT, ALBUMIN in the last 168 hours. No results for input(s): LIPASE, AMYLASE in the last 168 hours. No results for input(s): AMMONIA in the last 168 hours. Coagulation Profile: Recent Labs  Lab 09/12/18 1632  INR 1.10   Cardiac Enzymes: Recent Labs  Lab 09/12/18 2004 09/13/18 0144 09/13/18 0608  TROPONINI 0.38* 0.54* 0.50*   BNP (last 3 results) No results for input(s): PROBNP in the last 8760 hours. HbA1C: No results for input(s): HGBA1C in the last 72 hours. CBG: No results for input(s): GLUCAP in the last 168 hours. Lipid Profile: Recent Labs    09/13/18 0144  CHOL 308*  HDL 32*  LDLCALC 202*  TRIG 371*  CHOLHDL 9.6   Thyroid Function Tests: No results for input(s): TSH, T4TOTAL, FREET4, T3FREE, THYROIDAB in the last 72 hours. Anemia Panel: No results for input(s): VITAMINB12, FOLATE, FERRITIN, TIBC, IRON, RETICCTPCT in the last 72 hours. Urine analysis:    Component Value Date/Time   COLORURINE YELLOW 05/22/2012 0254   APPEARANCEUR Clear 12/03/2015 1122   LABSPEC 1.007 05/22/2012 0254   PHURINE 6.0 05/22/2012 0254   GLUCOSEU Negative 12/03/2015 1122   HGBUR SMALL (A) 05/22/2012 0254   BILIRUBINUR Negative 12/03/2015 1122   KETONESUR  NEGATIVE 05/22/2012 0254   PROTEINUR Negative 12/03/2015 1122   PROTEINUR NEGATIVE 05/22/2012 0254   UROBILINOGEN negative 06/21/2015 1654   UROBILINOGEN 0.2 05/22/2012 0254   NITRITE Negative 12/03/2015 1122   NITRITE NEGATIVE 05/22/2012 0254   LEUKOCYTESUR Trace (A) 12/03/2015 1122   Sepsis Labs: Invalid input(s): PROCALCITONIN, LACTICIDVEN  No results found for this or any previous visit (from the past 240 hour(s)).    Radiology Studies: Dg Chest 2 View  Result Date: 09/12/2018 CLINICAL DATA:  Chest pain for several days EXAM: CHEST - 2 VIEW COMPARISON:  09/10/18 FINDINGS: Cardiac shadow is stable. Aortic calcifications are again seen. The lungs are clear bilaterally. No acute bony abnormality is noted. IMPRESSION: No acute abnormality noted.  No change from the prior exam. Electronically Signed   By: Inez Catalina M.D.   On: 09/12/2018 16:50    Marzetta Board, MD, PhD [facility]  Contact via  [email or web address]  [facility] Office Info P: [phone]  F: [phone]

## 2018-09-13 NOTE — Progress Notes (Signed)
ANTICOAGULATION CONSULT NOTE - Follow Up Consult  Pharmacy Consult for heparin Indication: r/o NSTEMI and hypercoaguability  Labs: Recent Labs    09/12/18 1613 09/12/18 1632 09/12/18 2004 09/13/18 0144 09/13/18 0608 09/13/18 0957  HGB 13.9  --   --  12.8 12.5  --   HCT 43.4  --   --  40.0 39.0  --   PLT 406*  --   --  369 367  --   APTT  --   --   --  36  --  60*  LABPROT  --  14.1  --   --   --   --   INR  --  1.10  --   --   --   --   HEPARINUNFRC  --   --   --  0.20*  --   --   CREATININE 0.70  --   --   --  0.79  --   TROPONINI  --   --  0.38* 0.54* 0.50*  --     Assessment: [name] [name] admitted 09/12/18 on chronic anticoagulation (Xarelto) PTA for history of DVT/PEs and hypercoagulable state. Pharmacy has been consulted for heparin.   Patient subtherapeutic on heparin this AM (aPTT 60). Patient's last dose of Xarelto was 1/25 PM. CBC stable, and no s/sx of bleeding or issues with heparin infusion. Planning for patient to go to cath today.  Goal of Therapy:  aPTT 66-102 seconds   Plan:  - Increase heparin infusion to 1300 units/hr - Check aPTT in 6 hours - Daily aPTT/heparin level and CBC  Thank you for allowing pharmacy to be a part of this patient's care.  Leron Croak, PharmD PGY1 Pharmacy Resident Phone: [phone]  Please check AMION for all [place] phone numbers 09/13/2018,10:55 AM

## 2018-09-13 NOTE — Progress Notes (Signed)
Pt came up from ED complaining of chest pain & BP 195/101.  On call [facility] made aware and received orders for SL nitro. Pt chest pain was relieved with 1 nitro and BP lowered to 137/74. Will continue to monitor.

## 2018-09-13 NOTE — Progress Notes (Signed)
2D Echocardiogram has been performed.  [name] [name] 09/13/2018, 9:43 AM

## 2018-09-13 NOTE — Progress Notes (Signed)
ANTICOAGULATION CONSULT NOTE - Follow Up Consult  Pharmacy Consult for heparin Indication: r/o NSTEMI and hypercoaguability  Labs: Recent Labs    09/12/18 1613 09/12/18 1632 09/12/18 2004 09/13/18 0144  HGB 13.9  --   --  12.8  HCT 43.4  --   --  40.0  PLT 406*  --   --  369  APTT  --   --   --  36  LABPROT  --  14.1  --   --   INR  --  1.10  --   --   HEPARINUNFRC  --   --   --  0.20*  CREATININE 0.70  --   --   --   TROPONINI  --   --  0.38* 0.54*    Assessment: 62yo female subtherapeutic on heparin with initial dosing while Xarelto on hold; no gtt issues or signs of bleeding per RN.  Goal of Therapy:  aPTT 66-102 seconds   Plan:  Will increase heparin gtt by 4 units/kg/hr to 1200 units/hr and check PTT in 6 hours.    Wynona Neat, PharmD, BCPS  09/13/2018,3:38 AM

## 2018-09-13 NOTE — Progress Notes (Signed)
Pt started bleeding from TR band when the air was 8 mLs.  Added 6 mLs of air to the TR band to stop bleeding.  Idolina Primer, RN

## 2018-09-13 NOTE — Progress Notes (Addendum)
Pt RN Yoko updated.  C/o chest pain 7/10 midsternal. EKG done with noted changes in lateral and anterior leads from prior EKG. Ntg SL given x 1 with complete relief. Dr. Ellyn Hack updated in the hallway.

## 2018-09-13 NOTE — Progress Notes (Addendum)
Progress Note  Patient Name: [name] [name] Date of Encounter: 09/13/2018  Primary Cardiologist: New   Subjective   Denies recurrent chest pain. Continues with NTG paste  She did note ~7/10 CP just prior to Attending evaluation - now 0/10 with NTG SL & change out of paste.  Inpatient Medications    Scheduled Meds: . aspirin EC  81 mg Oral Daily  . lisinopril  40 mg Oral Daily  . metoprolol tartrate  25 mg Oral BID  . nitroGLYCERIN  1 inch Topical Q6H  . sodium chloride flush  3 mL Intravenous Q12H   Continuous Infusions: . heparin 1,200 Units/hr (09/13/18 0500)   PRN Meds: acetaminophen **OR** acetaminophen, nitroGLYCERIN, ondansetron **OR** ondansetron (ZOFRAN) IV   Vital Signs    Vitals:   09/12/18 2145 09/13/18 0045 09/13/18 0115 09/13/18 0445  BP: 130/73 137/74 132/67 (!) 127/58  Pulse:      Resp:    19  Temp:    98.6 F (37 C)  TempSrc:    Oral  SpO2:    96%  Weight:    68.7 kg    Intake/Output Summary (Last 24 hours) at 09/13/2018 0816 Last data filed at 09/13/2018 0500 Gross per 24 hour  Intake 378.63 ml  Output -  Net 378.63 ml   Filed Weights   09/12/18 2020 09/13/18 0445  Weight: 69.3 kg 68.7 kg    Physical Exam   General: Well developed, well nourished, NAD Skin: Warm, dry, intact  Head: Normocephalic, atraumatic, clear, moist mucus membranes. Neck: Negative for carotid bruits. No JVD Lungs:Clear to ausculation bilaterally. No wheezes, rales, or rhonchi. Breathing is unlabored. Cardiovascular: RRR with S1 S2. No murmurs, rubs, gallops, or LV heave appreciated. Abdomen: Soft, non-tender, non-distended with normoactive bowel sounds. No obvious abdominal masses. MSK: Strength and tone appear normal for age. 5/5 in all extremities Extremities: No edema. No clubbing or cyanosis. DP/PT pulses 2+ bilaterally Neuro: Alert and oriented. No focal deficits. No facial asymmetry. MAE spontaneously. Psych: Responds to questions appropriately  with normal affect.    Labs    Chemistry Recent Labs  Lab 09/12/18 1613 09/13/18 0608  NA 137 137  K 3.0* 4.5  CL 104 104  CO2 23 23  GLUCOSE 130* 99  BUN 8 9  CREATININE 0.70 0.79  CALCIUM 9.9 9.2  GFRNONAA >60 >60  GFRAA >60 >60  ANIONGAP 10 10    Hematology Recent Labs  Lab 09/12/18 1613 09/13/18 0144 09/13/18 0608  WBC 11.4* 10.0 8.3  RBC 4.89 4.54 4.39  HGB 13.9 12.8 12.5  HCT 43.4 40.0 39.0  MCV 88.8 88.1 88.8  MCH 28.4 28.2 28.5  MCHC 32.0 32.0 32.1  RDW 12.8 12.8 12.8  PLT 406* 369 367   Cardiac Enzymes Recent Labs  Lab 09/12/18 2004 09/13/18 0144 09/13/18 0608  TROPONINI 0.38* 0.54* 0.50*    Recent Labs  Lab 09/12/18 1611  TROPIPOC 0.27*     BNPNo results for input(s): BNP, PROBNP in the last 168 hours.   DDimer No results for input(s): DDIMER in the last 168 hours.   Radiology    Dg Chest 2 View  Result Date: 09/12/2018 CLINICAL DATA:  Chest pain for several days EXAM: CHEST - 2 VIEW COMPARISON:  09/10/18 FINDINGS: Cardiac shadow is stable. Aortic calcifications are again seen. The lungs are clear bilaterally. No acute bony abnormality is noted. IMPRESSION: No acute abnormality noted.  No change from the prior exam. Electronically Signed   By:  Inez Catalina M.D.   On: 09/12/2018 16:50   Telemetry    09/13/2018 NSR- Personally Reviewed  ECG    No new tracing as of 09/13/2018- Personally Reviewed  Cardiac Studies   Echocardiogram 09/13/2018: Pending   Patient Profile     63 y.o. female w/ h/o lupus anticoagulant and h/o DVT/PE, on chronic OAC, h/o HTN but no significant prior h/o CAD or other cardiac problems has been admitted after presenting to ED w/ CP  Assessment & Plan    1.  NSTEMI: -Patient reports a 6 day period of intermittent chest pain with radiation to her neck and shoulders with no associated shortness of breath, diaphoresis or nausea which worsened on 09/09/2018.  Initial plan was for transfer [facility] OSH to [facility] however  no beds were available. She then left and returned to [facility] after recurrent chest pain and pressure on Sunday   -Initial troponin found to be mildly elevated with at 0.27> 0.38> 0.54> 0.50 with no acute EKG changes -Creatinine stable, 0.79 -Currently chest pain-free -Plan for cardiac catheterization today, 09/13/2018 -Echocardiogram planned for today  -Continue ASA, lisinopril 40, metoprolol 25, NTG 1 inch paste -Continue Hep gtt for ACS  -- will need Statin (see FLP).  So you are as low usually overall  2. HTN: -Stable, 127/58, 132/67, 137/74 -Continue lisinopril 40, metoprolol 25  3.  History of DVT/PE: -Patient on home Xarelto>>> currently on hold -Hep gtt   4. Hx of lupus anticoagulant syndrome: -See above -Per primary team  5.  Tobacco use: -Smoking cessation strongly encouraged  Signed, Kathyrn Drown NP-C [facility] Pager: [phone] 09/13/2018, 8:16 AM     ATTENDING ATTESTATION  I have seen, examined and evaluated the patient this AM (note signed in PM) along with Kathyrn Drown, NP-C.  After reviewing all the available data and chart, we discussed the patients laboratory, study & physical findings as well as symptoms in detail. I agree with her findings, examination as well as impression recommendations as per our discussion.    Attending adjustments noted in italics.   Ms. Bayliss presents with acute coronary syndrome and elevated troponin consistent with non-ST elevation MI.  Plan is for cardiac catheterization today.  Her Xarelto has been on hold since Saturday night.  There was some question about whether she received last night, but she did not. She is on the Cath Lab scheduled for later on today.  Starting low-dose statin. Continue ACE inhibitor and beta-blocker. On aspirin and now IV heparin.  -With Xarelto underlying depression coagulant, if PCI is required, would need to use 1 month aspirin/Plavix plus Xarelto and then stop aspirin after 1 month.      Glenetta Hew, M.D., M.S. Interventional Cardiologist   Pager # [phone] Phone # [phone] [address] [place], [place] [address]    For questions or updates, please contact   Please consult [email or web address] for contact info under Cardiology/STEMI.

## 2018-09-14 ENCOUNTER — Encounter (HOSPITAL_COMMUNITY)
Admission: EM | Disposition: A | Discharge status: Home / Self Care | Payer: Self-pay | Source: Home / Self Care | Attending: Internal Medicine

## 2018-09-14 ENCOUNTER — Encounter (HOSPITAL_COMMUNITY): Payer: Self-pay | Admitting: Cardiology

## 2018-09-14 DIAGNOSIS — Z86711 Personal history of pulmonary embolism: Secondary | ICD-10-CM

## 2018-09-14 DIAGNOSIS — I25110 Atherosclerotic heart disease of native coronary artery with unstable angina pectoris: Secondary | ICD-10-CM

## 2018-09-14 DIAGNOSIS — I2510 Atherosclerotic heart disease of native coronary artery without angina pectoris: Secondary | ICD-10-CM

## 2018-09-14 DIAGNOSIS — I214 Non-ST elevation (NSTEMI) myocardial infarction: Secondary | ICD-10-CM

## 2018-09-14 HISTORY — PX: INTRAVASCULAR PRESSURE WIRE/FFR STUDY: CATH118243

## 2018-09-14 HISTORY — PX: CORONARY STENT INTERVENTION: CATH118234

## 2018-09-14 LAB — BASIC METABOLIC PANEL
Anion gap: 8 (ref 5–15)
BUN: 11 mg/dL (ref 8–23)
CO2: 22 mmol/L (ref 22–32)
Calcium: 9.1 mg/dL (ref 8.9–10.3)
Chloride: 108 mmol/L (ref 98–111)
Creatinine, Ser: 0.7 mg/dL (ref 0.44–1.00)
GFR calc Af Amer: >60 mL/min (ref >60)
GFR calc non Af Amer: >60 mL/min (ref >60)
Glucose, Bld: 101 mg/dL — ABNORMAL HIGH (ref 70–99)
Potassium: 3.9 mmol/L (ref 3.5–5.1)
Sodium: 138 mmol/L (ref 135–145)

## 2018-09-14 LAB — POCT ACTIVATED CLOTTING TIME
Activated Clotting Time: 246 seconds
Activated Clotting Time: 450 seconds

## 2018-09-14 LAB — CBC
HCT: 36.3 % (ref 36.0–46.0)
Hemoglobin: 11.9 g/dL — ABNORMAL LOW (ref 12.0–15.0)
MCH: 29.2 pg (ref 26.0–34.0)
MCHC: 32.8 g/dL (ref 30.0–36.0)
MCV: 89.2 fL (ref 80.0–100.0)
Platelets: 340 10*3/uL (ref 150–400)
RBC: 4.07 MIL/uL (ref 3.87–5.11)
RDW: 12.9 % (ref 11.5–15.5)
WBC: 8.4 10*3/uL (ref 4.0–10.5)
nRBC: 0 % (ref 0.0–0.2)

## 2018-09-14 LAB — HEPARIN LEVEL (UNFRACTIONATED): Heparin Unfractionated: 0.54 IU/mL (ref 0.30–0.70)

## 2018-09-14 LAB — APTT: aPTT: 68 seconds — ABNORMAL HIGH (ref 24–36)

## 2018-09-14 SURGERY — CORONARY STENT INTERVENTION
Anesthesia: LOCAL

## 2018-09-14 MED ORDER — HYDRALAZINE HCL 20 MG/ML IJ SOLN: 5.0000 mg | INTRAMUSCULAR | Status: AC | PRN

## 2018-09-14 MED ORDER — LIDOCAINE HCL (PF) 1 % IJ SOLN
INTRAMUSCULAR | Status: AC
  Filled 2018-09-14: qty 30

## 2018-09-14 MED ORDER — FENTANYL CITRATE (PF) 100 MCG/2ML IJ SOLN
INTRAMUSCULAR | Status: AC
  Filled 2018-09-14: qty 2

## 2018-09-14 MED ORDER — SODIUM CHLORIDE 0.9 % WEIGHT BASED INFUSION
1.0000 mL/kg/h | INTRAVENOUS | Status: AC
  Administered 2018-09-14: 17:00:00 1 mL/kg/h via INTRAVENOUS

## 2018-09-14 MED ORDER — HEPARIN SODIUM (PORCINE) 1000 UNIT/ML IJ SOLN
INTRAMUSCULAR | Status: AC
  Filled 2018-09-14: qty 1

## 2018-09-14 MED ORDER — VERAPAMIL HCL 2.5 MG/ML IV SOLN
INTRAVENOUS | Status: AC
  Filled 2018-09-14: qty 2

## 2018-09-14 MED ORDER — ANGIOPLASTY BOOK
Freq: Once | Status: AC
  Administered 2018-09-14: 22:00:00
  Filled 2018-09-14: qty 1

## 2018-09-14 MED ORDER — CLOPIDOGREL BISULFATE 75 MG PO TABS
600.0000 mg | ORAL_TABLET | Freq: Once | ORAL | Status: AC
  Administered 2018-09-14: 600 mg via ORAL
  Filled 2018-09-14: qty 8

## 2018-09-14 MED ORDER — NITROGLYCERIN 1 MG/10 ML FOR IR/CATH LAB
INTRA_ARTERIAL | Status: DC | PRN
  Administered 2018-09-14 (×4): 200 ug via INTRACORONARY

## 2018-09-14 MED ORDER — IOHEXOL 350 MG/ML SOLN
INTRAVENOUS | Status: DC | PRN
  Administered 2018-09-14: 120 mL via INTRA_ARTERIAL

## 2018-09-14 MED ORDER — SODIUM CHLORIDE 0.9% FLUSH: 3.0000 mL | INTRAVENOUS | Status: DC | PRN

## 2018-09-14 MED ORDER — LABETALOL HCL 5 MG/ML IV SOLN: 10.0000 mg | INTRAVENOUS | Status: AC | PRN

## 2018-09-14 MED ORDER — SODIUM CHLORIDE 0.9 % IV SOLN: 250.0000 mL | INTRAVENOUS | Status: DC | PRN

## 2018-09-14 MED ORDER — HEPARIN (PORCINE) IN NACL 1000-0.9 UT/500ML-% IV SOLN
INTRAVENOUS | Status: AC
  Filled 2018-09-14: qty 500

## 2018-09-14 MED ORDER — SODIUM CHLORIDE 0.9% FLUSH
3.0000 mL | Freq: Two times a day (BID) | INTRAVENOUS | Status: DC
  Administered 2018-09-15: 3 mL via INTRAVENOUS

## 2018-09-14 MED ORDER — HEPARIN (PORCINE) IN NACL 1000-0.9 UT/500ML-% IV SOLN
INTRAVENOUS | Status: AC
  Filled 2018-09-14: qty 1000

## 2018-09-14 MED ORDER — MIDAZOLAM HCL 2 MG/2ML IJ SOLN
INTRAMUSCULAR | Status: AC
  Filled 2018-09-14: qty 2

## 2018-09-14 MED ORDER — HEPARIN (PORCINE) IN NACL 1000-0.9 UT/500ML-% IV SOLN
INTRAVENOUS | Status: DC | PRN
  Administered 2018-09-14 (×3): 500 mL

## 2018-09-14 MED ORDER — THE SENSUOUS HEART BOOK
Freq: Once | Status: AC
  Administered 2018-09-14: 22:00:00
  Filled 2018-09-14: qty 1

## 2018-09-14 MED ORDER — VERAPAMIL HCL 2.5 MG/ML IV SOLN
INTRAVENOUS | Status: DC | PRN
  Administered 2018-09-14: 10 mL via INTRA_ARTERIAL

## 2018-09-14 MED ORDER — HEPARIN SODIUM (PORCINE) 1000 UNIT/ML IJ SOLN
INTRAMUSCULAR | Status: DC | PRN
  Administered 2018-09-14: 7000 [IU] via INTRAVENOUS
  Administered 2018-09-14: 3000 [IU] via INTRAVENOUS

## 2018-09-14 MED ORDER — CLOPIDOGREL BISULFATE 75 MG PO TABS
75.0000 mg | ORAL_TABLET | Freq: Every day | ORAL | Status: DC
  Administered 2018-09-15: 10:00:00 75 mg via ORAL
  Filled 2018-09-14: qty 1

## 2018-09-14 MED ORDER — FENTANYL CITRATE (PF) 100 MCG/2ML IJ SOLN
INTRAMUSCULAR | Status: DC | PRN
  Administered 2018-09-14 (×3): 25 ug via INTRAVENOUS
  Administered 2018-09-14: 50 ug via INTRAVENOUS

## 2018-09-14 MED ORDER — LIDOCAINE HCL (PF) 1 % IJ SOLN
INTRAMUSCULAR | Status: DC | PRN
  Administered 2018-09-14: 2 mL via SUBCUTANEOUS

## 2018-09-14 MED ORDER — MIDAZOLAM HCL 2 MG/2ML IJ SOLN
INTRAMUSCULAR | Status: DC | PRN
  Administered 2018-09-14 (×2): 1 mg via INTRAVENOUS
  Administered 2018-09-14: 2 mg via INTRAVENOUS

## 2018-09-14 MED ORDER — HEART ATTACK BOUNCING BOOK
Freq: Once | Status: AC
  Administered 2018-09-14: 22:00:00
  Filled 2018-09-14: qty 1

## 2018-09-14 SURGICAL SUPPLY — 22 items
BALLN SAPPHIRE 2.5X15 (BALLOONS) ×2
BALLN ~~LOC~~ EMERGE MR 3.25X15 (BALLOONS) ×2
BALLN ~~LOC~~ EMERGE MR 3.5X20 (BALLOONS) ×2
BALLOON SAPPHIRE 2.5X15 (BALLOONS) ×1 IMPLANT
BALLOON ~~LOC~~ EMERGE MR 3.25X15 (BALLOONS) ×1 IMPLANT
BALLOON ~~LOC~~ EMERGE MR 3.5X20 (BALLOONS) ×1 IMPLANT
CATH LAUNCHER 5F JR4 (CATHETERS) ×2 IMPLANT
CATH LAUNCHER 6FR EBU3.5 (CATHETERS) ×2 IMPLANT
DEVICE RAD COMP TR BAND LRG (VASCULAR PRODUCTS) ×2 IMPLANT
GLIDESHEATH SLEND SS 6F .021 (SHEATH) ×2 IMPLANT
GUIDEWIRE INQWIRE 1.5J.035X260 (WIRE) ×1 IMPLANT
GUIDEWIRE PRESSURE COMET II (WIRE) ×2 IMPLANT
INQWIRE 1.5J .035X260CM (WIRE) ×2
KIT ENCORE 26 ADVANTAGE (KITS) ×2 IMPLANT
KIT HEART LEFT (KITS) ×2 IMPLANT
PACK CARDIAC CATHETERIZATION (CUSTOM PROCEDURE TRAY) ×2 IMPLANT
STENT RESOLUTE ONYX 3.0X22 (Permanent Stent) ×2 IMPLANT
STENT RESOLUTE ONYX 3.5X30 (Permanent Stent) ×2 IMPLANT
TRANSDUCER W/STOPCOCK (MISCELLANEOUS) ×2 IMPLANT
TUBING CIL FLEX 10 FLL-RA (TUBING) ×2 IMPLANT
WIRE ASAHI PROWATER 180CM (WIRE) ×2 IMPLANT
WIRE SION BLUE 180 (WIRE) ×2 IMPLANT

## 2018-09-14 NOTE — Progress Notes (Signed)
ANTICOAGULATION CONSULT NOTE - Follow Up Consult  Pharmacy Consult for heparin Indication: r/o NSTEMI and hypercoaguability  Labs: Recent Labs    09/12/18 1613 09/12/18 1632 09/12/18 2004 09/13/18 0144 09/13/18 7622 09/13/18 0957 09/14/18 0529 09/14/18 0807  HGB 13.9  --   --  12.8 12.5  --  11.9*  --   HCT 43.4  --   --  40.0 39.0  --  36.3  --   PLT 406*  --   --  369 367  --  340  --   APTT  --   --   --  36  --  60* 68*  --   LABPROT  --  14.1  --   --   --   --   --   --   INR  --  1.10  --   --   --   --   --   --   HEPARINUNFRC  --   --   --  0.20*  --   --   --  0.54  CREATININE 0.70  --   --   --  0.79  --  0.70  --   TROPONINI  --   --  0.38* 0.54* 0.50*  --   --   --     Assessment: 89 yoF admitted 09/12/18 on chronic anticoagulation (Xarelto) PTA for history of DVT/PEs (last dose per patient was on 09/11/18 at 2000) and hypercoagulable state. Pharmacy has been consulted for heparin.   Patient is s/p cath on 09/13/18. IV Heparin was resumed 09/14/18 at [place]. Patient is now scheduled for PCI today, 09/14/18. Heparin level is therapeutic this AM at 0.54. CBC remains stable. Nurse confirmed no issues with heparin infusion or s/sx of bleeding since resuming heparin infusion.   Goal of Therapy:  Heparin level 0.3-0.7 units/ml   Plan:  - Continue IV Heparin at 1300 units/h - Daily heparin level and CBC - Monitor for s/sx of bleeding - Will follow-up plans after PCI  Thank you for allowing pharmacy to be a part of this patient's care.  Leron Croak, PharmD PGY1 Pharmacy Resident Phone: [phone]  Please check AMION for all [place] phone numbers  09/14/2018 9:03 AM

## 2018-09-14 NOTE — Progress Notes (Signed)
TR BAND REMOVAL  LOCATION:    right radial  DEFLATED PER PROTOCOL:    Yes.    TIME BAND OFF / DRESSING APPLIED:    2112   SITE UPON ARRIVAL:    Level 1 (bruise)  SITE AFTER BAND REMOVAL:    Level 1 (bruise)  CIRCULATION SENSATION AND MOVEMENT:    Within Normal Limits   Yes.    COMMENTS:   Post TR band instructions given, DSD applied, good cap refill.

## 2018-09-14 NOTE — Progress Notes (Signed)
PROGRESS NOTE  [name] [name] OEV:[id number] DOB: 12-20-1955 DOA: 09/12/2018 PCP: Billie Ruddy, MD   LOS: 1 day   Brief Narrative / Interim history: [name] [name] is a 63 y.o. female with medical history significant for HTN, Hx of PE/DVT, Lupus anticoagulant syndrome, and tobacco use presents the ED with chest pain.  This is been going on for the past 3 weeks.  She is a smoker.  Troponins were slightly elevated.  She was admitted to the hospital cardiology was consulted.  Underwent catheterization yesterday with severe disease, no intervention was done on 1/27 as CABG may be an option.  After cardiology team discussed with the patient, she opted for PCI and not to CABG at this time.  Scheduled 1/28  Subjective: No chest pain, no shortness of breath.  No palpitations.  Assessment & Plan: Principal Problem:   Chest pain Active Problems:   Hypokalemia   Tobacco use   HTN (hypertension)   History of pulmonary embolus (PE)   Principal Problem Chest pain  -Cardiology consulted and are following.  She underwent left heart cath on 1/27 which showed severe three-vessel disease which are PCI amenable.  Alternatively she could have a CABG.  Cardiology team discussed with the patient, and she would like to try PCI which is scheduled for today 1/28. -Continue aspirin, hold Xarelto and continue heparin infusion preparation for the cath -She was started on beta-blockers  Active Problems History of PE/DVTs in the setting of hypercoagulable state due to lupus anticoagulant -She is on Xarelto indefinitely, will resume following cath per cardiology  Hypertension -Continue lisinopril, blood pressure controlled -Lopressor was added  Hypokalemia -Potassium 3.0 on admission, this was repleted, stable this morning at 3.9  Tobacco use -Counseled to stop, she is determined to do so  Hyperlipidemia -LDL 202, total cholesterol 308.  She will need to leave the hospital on a  statin  Scheduled Meds: . aspirin EC  81 mg Oral Daily  . clopidogrel  600 mg Oral Once  . [START ON 09/15/2018] clopidogrel  75 mg Oral Daily  . lisinopril  40 mg Oral Daily  . metoprolol tartrate  25 mg Oral BID  . nitroGLYCERIN  1 inch Topical Q6H  . rosuvastatin  40 mg Oral q1800  . sodium chloride flush  3 mL Intravenous Q12H  . sodium chloride flush  3 mL Intravenous Q12H  . sodium chloride flush  3 mL Intravenous Q12H   Continuous Infusions: . sodium chloride    . sodium chloride    . sodium chloride    . heparin 1,300 Units/hr (09/14/18 0031)   PRN Meds:.sodium chloride, sodium chloride, acetaminophen **OR** acetaminophen, nitroGLYCERIN, ondansetron **OR** ondansetron (ZOFRAN) IV, sodium chloride flush, sodium chloride flush  DVT prophylaxis: Heparin infusion Code Status: Full code Family Communication: Husband present at bedside Disposition Plan: Home when cleared by cardiology  Consultants:   Cardiology  Procedures:   Cardiac cath  LV end diastolic pressure is normal.  Prox Cx to Mid Cx lesion is 99% stenosed (CULPRIT LESION). Mid Cx lesion is 55% stenosed.  Prox LAD-1 lesion is 35% stenosed with 20% stenosed side branch in Ost 1st Diag. Prox LAD-2 lesion is 80% stenosed with 60% stenosed side branch in Ost 1st Sept.  Prox RCA lesion is 70% stenosed.   SUMMARY  Severe three-vessel disease with culprit lesion being 99% mid circumflex stenosis (followed by 60% lesion just beyond it), additional 80% proximal to mid LAD and likely 70% proximal RCA.  Normal  LVEDP.  Reduced EF by echocardiogram with global hypokinesis.  RECOMMENDATION  Reviewed images with interventional colleague, will review with additional colleagues.  All 3 lesions are PCI amenable, but would likely require at least long stents in the LAD and circumflex and a moderate size stent in the RCA.  Concern going forward would be need for long-term antiplatelet therapy in a patient who has  mandatory long-term anticoagulation with Xarelto. ? Will review images with interventional colleagues, would want to determine duration of antiplatelet therapy prior to deciding  PCI ? We will consult CVTS to provide surgical option with risks and benefits as well.  Standard post cath hydration and TR band removal.  Restart IV heparin 8 hours post pending decision for PCI versus CABG  Continue with respect modification including statin and beta-blocker along with smoking cessation counseling    2D echo:  Study Conclusions - Left ventricle: The cavity size was normal. Systolic function was mildly reduced. The estimated ejection fraction was in the range of 45% to 50%. Diffuse hypokinesis. Doppler parameters are consistent with abnormal left ventricular relaxation (grade 1 diastolic dysfunction). Doppler parameters are consistent with high ventricular filling pressure. - Aortic valve: Trileaflet; mildly thickened, mildly calcified leaflets. - Mitral valve: Calcified annulus. There was trivial regurgitation. - Pulmonic valve: There was moderate regurgitation.  Antimicrobials:  None    Objective: Vitals:   09/13/18 1840 09/13/18 2106 09/13/18 2353 09/14/18 0533  BP: (!) 164/88 (!) 129/59  121/69  Pulse: 78 64  62  Resp: 20 17    Temp:  98.4 F (36.9 C)  98.1 F (36.7 C)  TempSrc:  Oral  Oral  SpO2: 95% 98%  96%  Weight:   68.7 kg 69.1 kg    Intake/Output Summary (Last 24 hours) at 09/14/2018 0910 Last data filed at 09/14/2018 0600 Gross per 24 hour  Intake 842.15 ml  Output -  Net 842.15 ml   Filed Weights   09/13/18 0445 09/13/18 2353 09/14/18 0533  Weight: 68.7 kg 68.7 kg 69.1 kg    Examination:  Constitutional: She is in no distress Eyes: No scleral icterus ENMT: Moist mucous membranes Neck: normal, supple Respiratory: Clear to auscultation bilaterally without wheezing or crackles Cardiovascular: Regular rate and rhythm, no edema, good pulses Abdomen: Nontender,  bowel sounds positive Musculoskeletal: no clubbing / cyanosis.   Skin: No rashes seen Neurologic: No focal deficits   Data Reviewed: I have independently reviewed following labs and imaging studies   CBC: Recent Labs  Lab 09/12/18 1613 09/13/18 0144 09/13/18 0608 09/14/18 0529  WBC 11.4* 10.0 8.3 8.4  NEUTROABS 8.0*  --   --   --   HGB 13.9 12.8 12.5 11.9*  HCT 43.4 40.0 39.0 36.3  MCV 88.8 88.1 88.8 89.2  PLT 406* 369 367 151   Basic Metabolic Panel: Recent Labs  Lab 09/12/18 1613 09/12/18 2004 09/13/18 0608 09/14/18 0529  NA 137  --  137 138  K 3.0*  --  4.5 3.9  CL 104  --  104 108  CO2 23  --  23 22  GLUCOSE 130*  --  99 101*  BUN 8  --  9 11  CREATININE 0.70  --  0.79 0.70  CALCIUM 9.9  --  9.2 9.1  MG  --  2.0  --   --    GFR: Estimated Creatinine Clearance: 68.7 mL/min (by C-G formula based on SCr of 0.7 mg/dL). Liver Function Tests: No results for input(s): AST, ALT, ALKPHOS, BILITOT,  PROT, ALBUMIN in the last 168 hours. No results for input(s): LIPASE, AMYLASE in the last 168 hours. No results for input(s): AMMONIA in the last 168 hours. Coagulation Profile: Recent Labs  Lab 09/12/18 1632  INR 1.10   Cardiac Enzymes: Recent Labs  Lab 09/12/18 2004 09/13/18 0144 09/13/18 0608  TROPONINI 0.38* 0.54* 0.50*   BNP (last 3 results) No results for input(s): PROBNP in the last 8760 hours. HbA1C: No results for input(s): HGBA1C in the last 72 hours. CBG: No results for input(s): GLUCAP in the last 168 hours. Lipid Profile: Recent Labs    09/13/18 0144  CHOL 308*  HDL 32*  LDLCALC 202*  TRIG 371*  CHOLHDL 9.6   Thyroid Function Tests: No results for input(s): TSH, T4TOTAL, FREET4, T3FREE, THYROIDAB in the last 72 hours. Anemia Panel: No results for input(s): VITAMINB12, FOLATE, FERRITIN, TIBC, IRON, RETICCTPCT in the last 72 hours. Urine analysis:    Component Value Date/Time   COLORURINE YELLOW 09/13/2018 1418   APPEARANCEUR CLEAR  09/13/2018 1418   APPEARANCEUR Clear 12/03/2015 1122   LABSPEC 1.019 09/13/2018 1418   PHURINE 5.0 09/13/2018 1418   GLUCOSEU NEGATIVE 09/13/2018 1418   HGBUR MODERATE (A) 09/13/2018 1418   BILIRUBINUR NEGATIVE 09/13/2018 1418   BILIRUBINUR Negative 12/03/2015 1122   KETONESUR NEGATIVE 09/13/2018 1418   PROTEINUR NEGATIVE 09/13/2018 1418   UROBILINOGEN negative 06/21/2015 1654   UROBILINOGEN 0.2 05/22/2012 0254   NITRITE NEGATIVE 09/13/2018 1418   LEUKOCYTESUR NEGATIVE 09/13/2018 1418   LEUKOCYTESUR Trace (A) 12/03/2015 1122   Sepsis Labs: Invalid input(s): PROCALCITONIN, LACTICIDVEN  No results found for this or any previous visit (from the past 240 hour(s)).    Radiology Studies: Dg Chest 2 View  Result Date: 09/12/2018 CLINICAL DATA:  Chest pain for several days EXAM: CHEST - 2 VIEW COMPARISON:  09/10/18 FINDINGS: Cardiac shadow is stable. Aortic calcifications are again seen. The lungs are clear bilaterally. No acute bony abnormality is noted. IMPRESSION: No acute abnormality noted.  No change from the prior exam. Electronically Signed   By: Inez Catalina M.D.   On: 09/12/2018 16:50    Marzetta Board, MD, PhD [facility]  Contact via  [email or web address]  [place] P: [phone]  F: [phone]

## 2018-09-14 NOTE — Progress Notes (Signed)
Progress Note  Patient Name: [name] [name] Date of Encounter: 09/14/2018  Primary Cardiologist: New   Subjective   No more chest pain overnight.  Had discussion with her husband and daughter and decided that she would not want bypass surgery.  We therefore canceled CT surgical consultation and plan for multivessel PCI today.  Inpatient Medications    Scheduled Meds: . aspirin EC  81 mg Oral Daily  . [START ON 09/15/2018] clopidogrel  75 mg Oral Daily  . lisinopril  40 mg Oral Daily  . metoprolol tartrate  25 mg Oral BID  . nitroGLYCERIN  1 inch Topical Q6H  . rosuvastatin  40 mg Oral q1800  . sodium chloride flush  3 mL Intravenous Q12H  . sodium chloride flush  3 mL Intravenous Q12H  . sodium chloride flush  3 mL Intravenous Q12H   Continuous Infusions: . sodium chloride    . sodium chloride    . sodium chloride 75 mL/hr at 09/14/18 1049  . heparin 1,300 Units/hr (09/14/18 0031)   PRN Meds: sodium chloride, sodium chloride, acetaminophen **OR** acetaminophen, nitroGLYCERIN, ondansetron **OR** ondansetron (ZOFRAN) IV, sodium chloride flush, sodium chloride flush   Vital Signs    Vitals:   09/13/18 2353 09/14/18 0533 09/14/18 0800 09/14/18 1009  BP:  121/69 121/71 136/64  Pulse:  62 62   Resp:      Temp:  98.1 F (36.7 C) 98.6 F (37 C)   TempSrc:  Oral Oral   SpO2:  96% 98%   Weight: 68.7 kg 69.1 kg      Intake/Output Summary (Last 24 hours) at 09/14/2018 1214 Last data filed at 09/14/2018 0600 Gross per 24 hour  Intake 782.15 ml  Output -  Net 782.15 ml   Filed Weights   09/13/18 0445 09/13/18 2353 09/14/18 0533  Weight: 68.7 kg 68.7 kg 69.1 kg    Physical Exam   Physical Exam  Constitutional: She is oriented to person, place, and time. She appears well-developed and well-nourished. No distress.  Well-groomed.  HENT:  Head: Normocephalic.  Neck: Normal range of motion. Neck supple. No JVD present.  Cardiovascular: Normal rate, regular  rhythm and intact distal pulses. Exam reveals no gallop and no friction rub.  No murmur heard. Pulmonary/Chest: Effort normal. No respiratory distress. She has no wheezes. She has no rales.  Abdominal: Soft. Bowel sounds are normal. She exhibits no distension. There is no abdominal tenderness. There is no rebound.  Musculoskeletal: Normal range of motion.        General: No edema.     Comments: Radial cath site CDI.  Dressing in place  Neurological: She is alert and oriented to person, place, and time. No cranial nerve deficit.  Psychiatric: She has a normal mood and affect. Her behavior is normal. Judgment and thought content normal.  Nursing note and vitals reviewed.   Labs    Chemistry Recent Labs  Lab 09/12/18 1613 09/13/18 0608 09/14/18 0529  NA 137 137 138  K 3.0* 4.5 3.9  CL 104 104 108  CO2 23 23 22   GLUCOSE 130* 99 101*  BUN 8 9 11   CREATININE 0.70 0.79 0.70  CALCIUM 9.9 9.2 9.1  GFRNONAA >60 >60 >60  GFRAA >60 >60 >60  ANIONGAP 10 10 8     Hematology Recent Labs  Lab 09/13/18 0144 09/13/18 0608 09/14/18 0529  WBC 10.0 8.3 8.4  RBC 4.54 4.39 4.07  HGB 12.8 12.5 11.9*  HCT 40.0 39.0 36.3  MCV 88.1 88.8 89.2  MCH 28.2 28.5 29.2  MCHC 32.0 32.1 32.8  RDW 12.8 12.8 12.9  PLT 369 367 340   Cardiac Enzymes Recent Labs  Lab 09/12/18 2004 09/13/18 0144 09/13/18 0608  TROPONINI 0.38* 0.54* 0.50*    Recent Labs  Lab 09/12/18 1611  TROPIPOC 0.27*     BNPNo results for input(s): BNP, PROBNP in the last 168 hours.   DDimer No results for input(s): DDIMER in the last 168 hours.   Radiology    Dg Chest 2 View  Result Date: 09/12/2018 CLINICAL DATA:  Chest pain for several days EXAM: CHEST - 2 VIEW COMPARISON:  09/10/18 FINDINGS: Cardiac shadow is stable. Aortic calcifications are again seen. The lungs are clear bilaterally. No acute bony abnormality is noted. IMPRESSION: No acute abnormality noted.  No change from the prior exam. Electronically Signed    By: Inez Catalina M.D.   On: 09/12/2018 16:50   Telemetry    09/13/2018 NSR- Personally Reviewed  ECG    No new tracing as of 09/13/2018- Personally Reviewed  Cardiac Studies    Echocardiogram 09/13/2018: EF 45-50% with diffuse HK.  Grade 1 diastolic function.  Mild aortic valve thickening but no stenosis.  Moderate pulmonary regurgitation.  Trivial MR.  Diagnostic Cardiac Cath 09/13/2018:  Will determine plan for PCI versus CABG in the next day or so, if possible would like to do PCI starting tomorrow if that is decision is made.  Patient would like to be discussed the risks and benefits with her husband when no longer sedated.  LV end diastolic pressure is normal.  Prox Cx to Mid Cx lesion is 99% stenosed (CULPRIT LESION). Mid Cx lesion is 55% stenosed.  Prox LAD-1 lesion is 35% stenosed with 20% stenosed side branch in Ost 1st Diag. Prox LAD-2 lesion is 80% stenosed with 60% stenosed side branch in Ost 1st Sept.  Prox RCA lesion is 70% stenosed.   SUMMARY  Severe three-vessel disease with culprit lesion being 99% mid circumflex stenosis (followed by 60% lesion just beyond it), additional 80% proximal to mid LAD and likely 70% proximal RCA.  Normal LVEDP.  Reduced EF by echocardiogram with global hypokinesis is mild  Patient Profile     63 y.o. female w/ h/o lupus anticoagulant and h/o DVT/PE, on chronic OAC, h/o HTN but no significant prior h/o CAD or other cardiac problems has been admitted after presenting to ED w/ CP  Assessment & Plan    1.  NSTEMI-multivessel CAD: 6 days of remittent chest pain associated with dyspnea diaphoresis and nausea.  Worse on January 23. --Underwent cardiac cath yesterday revealing three-vessel CAD.  With the need for her being on Xarelto, chose to delay PCI in order to discuss the risks of long-term dual antiplatelet therapy.  After discussion with interventional colleagues and she had discussion with her family, she decided that she would not  want bypass surgery and would want multivessel PCI.  I discussed with Dr. Martinique who plans PCI today on the LAD and circumflex lesions and possibly the RCA severe.. -Initial troponin found to be mildly elevated with at 0.27> 0.38> 0.54> 0.50 with no acute EKG changes; chest pain-free -Creatinine stable, 0.79 -Continue ASA, lisinopril 40, metoprolol 25, NTG 1 inch paste -Continue Hep gtt for ACS  -- will need Statin (see FLP).  So you are as low usually overall  2. HTN: -Stable pressures on current medicines -Continue lisinopril 40, metoprolol 25  3.  History of DVT/PE with  lupus anticoagulant: -Patient on home Xarelto>>> currently on hold -Hep gtt restarted.  Plan for PCI today.  Will restart Xarelto either this evening or tomorrow depending on timing of case. -With Xarelto underlying depression coagulant, if PCI is required, would need to use 1 month aspirin/Plavix plus Xarelto and then stop aspirin after 1 month.  5.  Tobacco use: -Smoking cessation strongly encouraged     Signed, Glenetta Hew, MD Glenetta Hew, M.D., M.S. Interventional Cardiologist   Pager # [phone] Phone # [phone] [address] [place], [place] [address]   For questions or updates, please contact   Please consult [email or web address] for contact info under Cardiology/STEMI.

## 2018-09-14 NOTE — H&P (View-Only) (Signed)
Progress Note  Patient Name: [name] [name] Date of Encounter: 09/14/2018  Primary Cardiologist: New   Subjective   No more chest pain overnight.  Had discussion with her husband and daughter and decided that she would not want bypass surgery.  We therefore canceled CT surgical consultation and plan for multivessel PCI today.  Inpatient Medications    Scheduled Meds: . aspirin EC  81 mg Oral Daily  . [START ON 09/15/2018] clopidogrel  75 mg Oral Daily  . lisinopril  40 mg Oral Daily  . metoprolol tartrate  25 mg Oral BID  . nitroGLYCERIN  1 inch Topical Q6H  . rosuvastatin  40 mg Oral q1800  . sodium chloride flush  3 mL Intravenous Q12H  . sodium chloride flush  3 mL Intravenous Q12H  . sodium chloride flush  3 mL Intravenous Q12H   Continuous Infusions: . sodium chloride    . sodium chloride    . sodium chloride 75 mL/hr at 09/14/18 1049  . heparin 1,300 Units/hr (09/14/18 0031)   PRN Meds: sodium chloride, sodium chloride, acetaminophen **OR** acetaminophen, nitroGLYCERIN, ondansetron **OR** ondansetron (ZOFRAN) IV, sodium chloride flush, sodium chloride flush   Vital Signs    Vitals:   09/13/18 2353 09/14/18 0533 09/14/18 0800 09/14/18 1009  BP:  121/69 121/71 136/64  Pulse:  62 62   Resp:      Temp:  98.1 F (36.7 C) 98.6 F (37 C)   TempSrc:  Oral Oral   SpO2:  96% 98%   Weight: 68.7 kg 69.1 kg      Intake/Output Summary (Last 24 hours) at 09/14/2018 1214 Last data filed at 09/14/2018 0600 Gross per 24 hour  Intake 782.15 ml  Output -  Net 782.15 ml   Filed Weights   09/13/18 0445 09/13/18 2353 09/14/18 0533  Weight: 68.7 kg 68.7 kg 69.1 kg    Physical Exam   Physical Exam  Constitutional: She is oriented to person, place, and time. She appears well-developed and well-nourished. No distress.  Well-groomed.  HENT:  Head: Normocephalic.  Neck: Normal range of motion. Neck supple. No JVD present.  Cardiovascular: Normal rate, regular  rhythm and intact distal pulses. Exam reveals no gallop and no friction rub.  No murmur heard. Pulmonary/Chest: Effort normal. No respiratory distress. She has no wheezes. She has no rales.  Abdominal: Soft. Bowel sounds are normal. She exhibits no distension. There is no abdominal tenderness. There is no rebound.  Musculoskeletal: Normal range of motion.        General: No edema.     Comments: Radial cath site CDI.  Dressing in place  Neurological: She is alert and oriented to person, place, and time. No cranial nerve deficit.  Psychiatric: She has a normal mood and affect. Her behavior is normal. Judgment and thought content normal.  Nursing note and vitals reviewed.   Labs    Chemistry Recent Labs  Lab 09/12/18 1613 09/13/18 0608 09/14/18 0529  NA 137 137 138  K 3.0* 4.5 3.9  CL 104 104 108  CO2 23 23 22   GLUCOSE 130* 99 101*  BUN 8 9 11   CREATININE 0.70 0.79 0.70  CALCIUM 9.9 9.2 9.1  GFRNONAA >60 >60 >60  GFRAA >60 >60 >60  ANIONGAP 10 10 8     Hematology Recent Labs  Lab 09/13/18 0144 09/13/18 0608 09/14/18 0529  WBC 10.0 8.3 8.4  RBC 4.54 4.39 4.07  HGB 12.8 12.5 11.9*  HCT 40.0 39.0 36.3  MCV 88.1 88.8 89.2  MCH 28.2 28.5 29.2  MCHC 32.0 32.1 32.8  RDW 12.8 12.8 12.9  PLT 369 367 340   Cardiac Enzymes Recent Labs  Lab 09/12/18 2004 09/13/18 0144 09/13/18 0608  TROPONINI 0.38* 0.54* 0.50*    Recent Labs  Lab 09/12/18 1611  TROPIPOC 0.27*     BNPNo results for input(s): BNP, PROBNP in the last 168 hours.   DDimer No results for input(s): DDIMER in the last 168 hours.   Radiology    Dg Chest 2 View  Result Date: 09/12/2018 CLINICAL DATA:  Chest pain for several days EXAM: CHEST - 2 VIEW COMPARISON:  09/10/18 FINDINGS: Cardiac shadow is stable. Aortic calcifications are again seen. The lungs are clear bilaterally. No acute bony abnormality is noted. IMPRESSION: No acute abnormality noted.  No change from the prior exam. Electronically Signed    By: Inez Catalina M.D.   On: 09/12/2018 16:50   Telemetry    09/13/2018 NSR- Personally Reviewed  ECG    No new tracing as of 09/13/2018- Personally Reviewed  Cardiac Studies    Echocardiogram 09/13/2018: EF 45-50% with diffuse HK.  Grade 1 diastolic function.  Mild aortic valve thickening but no stenosis.  Moderate pulmonary regurgitation.  Trivial MR.  Diagnostic Cardiac Cath 09/13/2018:  Will determine plan for PCI versus CABG in the next day or so, if possible would like to do PCI starting tomorrow if that is decision is made.  Patient would like to be discussed the risks and benefits with her husband when no longer sedated.  LV end diastolic pressure is normal.  Prox Cx to Mid Cx lesion is 99% stenosed (CULPRIT LESION). Mid Cx lesion is 55% stenosed.  Prox LAD-1 lesion is 35% stenosed with 20% stenosed side branch in Ost 1st Diag. Prox LAD-2 lesion is 80% stenosed with 60% stenosed side branch in Ost 1st Sept.  Prox RCA lesion is 70% stenosed.   SUMMARY  Severe three-vessel disease with culprit lesion being 99% mid circumflex stenosis (followed by 60% lesion just beyond it), additional 80% proximal to mid LAD and likely 70% proximal RCA.  Normal LVEDP.  Reduced EF by echocardiogram with global hypokinesis is mild  Patient Profile     63 y.o. female w/ h/o lupus anticoagulant and h/o DVT/PE, on chronic OAC, h/o HTN but no significant prior h/o CAD or other cardiac problems has been admitted after presenting to ED w/ CP  Assessment & Plan    1.  NSTEMI-multivessel CAD: 6 days of remittent chest pain associated with dyspnea diaphoresis and nausea.  Worse on January 23. --Underwent cardiac cath yesterday revealing three-vessel CAD.  With the need for her being on Xarelto, chose to delay PCI in order to discuss the risks of long-term dual antiplatelet therapy.  After discussion with interventional colleagues and she had discussion with her family, she decided that she would not  want bypass surgery and would want multivessel PCI.  I discussed with Dr. Martinique who plans PCI today on the LAD and circumflex lesions and possibly the RCA severe.. -Initial troponin found to be mildly elevated with at 0.27> 0.38> 0.54> 0.50 with no acute EKG changes; chest pain-free -Creatinine stable, 0.79 -Continue ASA, lisinopril 40, metoprolol 25, NTG 1 inch paste -Continue Hep gtt for ACS  -- will need Statin (see FLP).  So you are as low usually overall  2. HTN: -Stable pressures on current medicines -Continue lisinopril 40, metoprolol 25  3.  History of DVT/PE with  lupus anticoagulant: -Patient on home Xarelto>>> currently on hold -Hep gtt restarted.  Plan for PCI today.  Will restart Xarelto either this evening or tomorrow depending on timing of case. -With Xarelto underlying depression coagulant, if PCI is required, would need to use 1 month aspirin/Plavix plus Xarelto and then stop aspirin after 1 month.  5.  Tobacco use: -Smoking cessation strongly encouraged     Signed, Glenetta Hew, MD Glenetta Hew, M.D., M.S. Interventional Cardiologist   Pager # [phone] Phone # [phone] [address] [place], [place] [address]   For questions or updates, please contact   Please consult [email or web address] for contact info under Cardiology/STEMI.

## 2018-09-14 NOTE — Interval H&P Note (Signed)
History and Physical Interval Note:  09/14/2018 1:26 PM  [name] [name]  has presented today for surgery, with the diagnosis of cad  The various methods of treatment have been discussed with the patient and family. After consideration of risks, benefits and other options for treatment, the patient has consented to  Procedure(s): CORONARY STENT INTERVENTION (N/A) as a surgical intervention .  The patient's history has been reviewed, patient examined, no change in status, stable for surgery.  I have reviewed the patient's chart and labs.  Questions were answered to the patient's satisfaction.   Cath Lab Visit (complete for each Cath Lab visit)  Clinical Evaluation Leading to the Procedure:   ACS: Yes.    Non-ACS:    Anginal Classification: CCS IV  Anti-ischemic medical therapy: Minimal Therapy (1 class of medications)  Non-Invasive Test Results: No non-invasive testing performed  Prior CABG: No previous CABG        [name] 09/14/2018  1:26 PM

## 2018-09-15 ENCOUNTER — Telehealth: Payer: Self-pay | Admitting: Cardiology

## 2018-09-15 ENCOUNTER — Encounter (HOSPITAL_COMMUNITY): Payer: Self-pay | Admitting: Cardiology

## 2018-09-15 DIAGNOSIS — Z955 Presence of coronary angioplasty implant and graft: Secondary | ICD-10-CM

## 2018-09-15 LAB — BASIC METABOLIC PANEL
Anion gap: 11 (ref 5–15)
BUN: 8 mg/dL (ref 8–23)
CO2: 22 mmol/L (ref 22–32)
Calcium: 9 mg/dL (ref 8.9–10.3)
Chloride: 106 mmol/L (ref 98–111)
Creatinine, Ser: 0.67 mg/dL (ref 0.44–1.00)
GFR calc Af Amer: >60 mL/min (ref >60)
GFR calc non Af Amer: >60 mL/min (ref >60)
Glucose, Bld: 101 mg/dL — ABNORMAL HIGH (ref 70–99)
Potassium: 4.4 mmol/L (ref 3.5–5.1)
Sodium: 139 mmol/L (ref 135–145)

## 2018-09-15 LAB — CBC
HCT: 35.4 % — ABNORMAL LOW (ref 36.0–46.0)
Hemoglobin: 11.1 g/dL — ABNORMAL LOW (ref 12.0–15.0)
MCH: 28.2 pg (ref 26.0–34.0)
MCHC: 31.4 g/dL (ref 30.0–36.0)
MCV: 89.8 fL (ref 80.0–100.0)
Platelets: 295 10*3/uL (ref 150–400)
RBC: 3.94 MIL/uL (ref 3.87–5.11)
RDW: 12.6 % (ref 11.5–15.5)
WBC: 8.4 10*3/uL (ref 4.0–10.5)
nRBC: 0 % (ref 0.0–0.2)

## 2018-09-15 MED ORDER — ASPIRIN 81 MG PO TBEC: 81.0000 mg | DELAYED_RELEASE_TABLET | Freq: Every day | ORAL | 0 refills | Status: DC

## 2018-09-15 MED ORDER — CARVEDILOL 3.125 MG PO TABS
6.2500 mg | ORAL_TABLET | Freq: Two times a day (BID) | ORAL | Status: DC
  Administered 2018-09-15: 10:00:00 6.25 mg via ORAL
  Filled 2018-09-15: qty 2

## 2018-09-15 MED ORDER — RIVAROXABAN 20 MG PO TABS: 20.0000 mg | ORAL_TABLET | Freq: Every day | ORAL | Status: DC

## 2018-09-15 MED ORDER — CLOPIDOGREL BISULFATE 75 MG PO TABS: 75.0000 mg | ORAL_TABLET | Freq: Every day | ORAL | 1 refills | Status: DC

## 2018-09-15 MED ORDER — CARVEDILOL 6.25 MG PO TABS: 6.2500 mg | ORAL_TABLET | Freq: Two times a day (BID) | ORAL | 0 refills | Status: DC

## 2018-09-15 MED ORDER — ROSUVASTATIN CALCIUM 40 MG PO TABS: 40.0000 mg | ORAL_TABLET | Freq: Every day | ORAL | 0 refills | Status: DC

## 2018-09-15 MED ORDER — NITROGLYCERIN 0.4 MG SL SUBL: 0.4000 mg | SUBLINGUAL_TABLET | SUBLINGUAL | 0 refills | Status: DC | PRN

## 2018-09-15 MED FILL — ROSUVASTATIN CALCIUM 40 MG: 40 | 30 days supply | Qty: 30 | Fill #0

## 2018-09-15 MED FILL — ASPIRIN LOW DOSE 81 MG TBEC: 81 | 30 days supply | Qty: 30 | Fill #0

## 2018-09-15 MED FILL — CLOPIDOGREL 75 MG TABLET: 75 | 30 days supply | Qty: 30 | Fill #0

## 2018-09-15 MED FILL — CARVEDILOL 6.25 MG TABLET: 6.25 | 60 days supply | Qty: 60 | Fill #0

## 2018-09-15 NOTE — Discharge Summary (Signed)
**Note De-Identified vi Obfusction** Physicin Dischrge Summry  Emmli Heyboer HQI:[id number] DOB: ugust 28, 1957 DO: 09/12/2018  PCP: Billie Ruddy, MD  dmit dte: 09/12/2018 Dischrge dte: 09/15/2018  Time spent: 40 minutes  Recommendtions for Outptient Follow-up:  1. Follow up outptient CBC/CMP 2. Follow up with crdiology outptient    Dischrge Dignoses:  Principl Problem:   Chest pin ctive Problems:   Hypoklemi   Tobcco use   HTN (hypertension)   History of pulmonry embolus (PE)   Non-ST elevtion (NSTEMI) myocrdil infrction dvocte [facility])   Dischrge Condition: stble  Diet recommendtion: hert helthy  Filed Weights   09/13/18 2353 09/14/18 0533 09/15/18 0450  Weight: 68.7 kg 69.1 kg 69.9 kg    History of present illness:  Uilni Snville Sheltonis  63 y.o.femlewith medicl history significnt forHTN, Hx of PE/DVT, Lupus nticogulnt syndrome, nd tobcco usepresents the ED with chest pin.  This is been going on for the pst 3 weeks.  She is  smoker.  Troponins were slightly elevted.  She ws dmitted to the hospitl crdiology ws consulted.  Underwent ctheteriztion yesterdy with severe disese, no intervention ws done on 1/27 s CBG my be n option.  fter crdiology tem discussed with the ptient, she opted for PCI nd not to CBG t this time.  Scheduled 1/28  She ws dmitted for NSTEMI.  She hd ctheteriztion on 1/27 recommending PCI vs CBG.  She hd PCI fter discussion with crdiology with PCI of mid LCx with DES x1, proximl to mid LD with DES x1.  Strted on DPT x 1 month.  Then continue plvix x1 yer.  Resume xrelto dily.  Dischrged on 1/29 in stble condition.  Hospitl Course:  Chest pin   NSTEMI -Crdiology consulted nd re following.  She underwent left hert cth on 1/27 which showed severe three-vessel disese which re PCI menble.  lterntively she could hve  CBG.  Crdiology tem discussed with the ptient, nd she would like to try PCI. - s/p  PCI on 1/28 (see below) - Dischrged on spirin, plvix, crestor, crvedilol, nd xrelto.  Pln to d/c S fter 1 month nd continue plvix for t lest 1 yer.   HFrEF: mildly reduced EF to 45-50%.  Continue crvedilol, lisinopril.  Follow up with crdiology outptient.  History of PE/DVTs in the setting of hypercogulble stte due to lupus nticogulnt - resume xrelto  Hypertension -Continue lisinopril, crvedilol   Hypoklemi -resolved, follow   Tobcco use -Counseled to stop, she is determined to do so  Hyperlipidemi - dischrged on crestor, LDL >200, follow up with crds outptient  Procedures: Cth  Prox LD-1 lesion is 35% stenosed with 20% stenosed side brnch in Ost 1st Dig.  Prox LD-2 lesion is 80% stenosed with 90% stenosed side brnch in Ost 1st Sept.   drug-eluting stent ws successfully plced using  STENT RESOLUTE ONYX 3.0X22.  Post intervention, there is  0% residul stenosis.  Prox Cx to Mid Cx lesion is 99% stenosed.  Mid Cx lesion is 55% stenosed.   drug-eluting stent ws successfully plced using  STENT RESOLUTE ONYX 3.5X30.  Post intervention, there is  0% residul stenosis.  Prox RC lesion is 70% stenosed. DFR ws 0.99   1. Successful PCI of the mid LCx with DES x 1 2. Successful PCI of the proximl to mid LD with DES x 1. 3. Proximl RC lesion with norml DFR of 0.99  Pln: S 81 mg dily for one month. Plvix 75 mg dily for one yer. Pln resuming Xrelto 20 **Note De-Identified vi Obfusction** mg dily tomorrow. Anticipte DC tomorrow.   Cth 6/76  LV end distolic pressure is norml.  Prox Cx to Mid Cx lesion is 99% stenosed (CULPRIT LESION). Mid Cx lesion is 55% stenosed.  Prox LAD-1 lesion is 35% stenosed with 20% stenosed side brnch in Ost 1st Dig. Prox LAD-2 lesion is 80% stenosed with 60% stenosed side brnch in Ost 1st Sept.  Prox RCA lesion is 70% stenosed.   SUMMARY  Severe three-vessel disese with culprit lesion being 99% mid  circumflex stenosis (followed by 60% lesion just beyond it), dditionl 80% proximl to mid LAD nd likely 70% proximl RCA.  Norml LVEDP.  Reduced EF by echocrdiogrm with globl hypokinesis.   RECOMMENDATION  Reviewed imges with interventionl collegue, will review with dditionl collegues.  All 3 lesions re PCI menble, but would likely require t lest long stents in the LAD nd circumflex nd  moderte size stent in the RCA.  Concern going forwrd would be need for long-term ntipltelet therpy in  ptient who hs mndtory long-term nticogultion with Xrelto. ? Will review imges with interventionl collegues, would wnt to determine durtion of ntipltelet therpy prior to deciding  PCI ? We will consult CVTS to provide surgicl option with risks nd benefits s well.  Stndrd post cth hydrtion nd TR bnd removl.  Restrt IV heprin 8 hours post pending decision for PCI versus CABG  Continue with respect modifiction including sttin nd bet-blocker long with smoking cesstion counseling    Will determine pln for PCI versus CABG in the next dy or so, if possible would like to do PCI strting tomorrow if tht is decision is mde. Ptient would like to be discussed the risks nd benefits with her husbnd when no longer sedted.  Echo Study Conclusions  - Left ventricle: The cvity size ws norml. Systolic function ws   mildly reduced. The estimted ejection frction ws in the rnge   of 45% to 50%. Diffuse hypokinesis. Doppler prmeters re   consistent with bnorml left ventriculr relxtion (grde 1   distolic dysfunction). Doppler prmeters re consistent with   high ventriculr filling pressure. - Aortic vlve: Trileflet; mildly thickened, mildly clcified   leflets. - Mitrl vlve: Clcified nnulus. There ws trivil regurgittion. - Pulmonic vlve: There ws moderte regurgittion.  Consulttions:  crdiology  Dischrge  Exm: Vitls:   09/15/18 0450 09/15/18 0820  BP: (!) 154/81 (!) 183/72  Pulse: 63 75  Resp: 18 (!) 22  Temp: 97.7 F (36.5 C) 98.2 F (36.8 C)  SpO2: 97% 97%   Feeling well. Redy to go home. Husbnd t bedside.  Generl: No cute distress. Crdiovsculr: Hert sounds show  regulr rte, nd rhythm. Lungs: Cler to usculttion bilterll Abdomen: Soft, nontender, nondistended  Neurologicl: Alert nd oriented 3. Moves ll extremities 4. Crnil nerves II through XII grossly intct. Skin: Wrm nd dry. No rshes or lesions. Extremities: No clubbing or cynosis. No edem.  Psychitric: Mood nd ffect re norml. Insight nd judgment re pproprite.   Dischrge Instructions   Dischrge Instructions    Amb Referrl to Crdic Rehbilittion   Complete by:  As directed    Dignosis:   NSTEMI Coronry Stents     Cll MD for:  difficulty brething, hedche or visul disturbnces   Complete by:  As directed    Cll MD for:  extreme ftigue   Complete by:  As directed    Cll MD for:  hives   Complete by:  As directed **Note De-Identified vi Obfusction** Cll MD for:  persistnt dizziness or light-hededness   Complete by:  As directed    Cll MD for:  persistnt nuse nd vomiting   Complete by:  As directed    Cll MD for:  redness, tenderness, or signs of infection (pin, swelling, redness, odor or green/yellow dischrge round incision site)   Complete by:  As directed    Cll MD for:  severe uncontrolled pin   Complete by:  As directed    Cll MD for:  temperture >100.4   Complete by:  As directed    Diet - low sodium hert helthy   Complete by:  As directed    Dischrge instructions   Complete by:  As directed    You were seen for  hert ttck.  You underwent crdic ctheteriztion with stenting.    Tke spirin nd plvix for 1 month, then stop spirin.  Continue plvix for t lest 1 yer, or until instructed otherwise by crdiology.  Continue your xrelto.  We've strted you  on crvedilol for blood pressure s well s crestor for cholesterol.    I'll lso prescribe sublingul nitro.  You cn tke this in the setting of chest pin, but if you need to tke this, plese cll your doctor.  Return for new, recurrent, or worsening symptoms.  Plese sk your PCP to request records from this hospitliztion so they know wht ws done nd wht the next steps will be.   Increse ctivity slowly   Complete by:  As directed      Allergies s of 09/15/2018      Rections   Prednisone Other (See Comments)   No energy "stlls out".    Mkes pt  Jittery.   Trmdol Other (See Comments)   Constiption,  Mkes pt  Jittery.      Mediction List    TAKE these medictions   cetminophen 325 MG tblet Commonly known s:  TYLENOL Tke 650 mg by mouth every 6 (six) hours s needed for mild pin.   spirin 81 MG EC tblet Tke 1 tblet (81 mg totl) by mouth dily for 30 dys.   crvedilol 6.25 MG tblet Commonly known s:  COREG Tke 1 tblet (6.25 mg totl) by mouth 2 (two) times dily with  mel for 30 dys.   clopidogrel 75 MG tblet Commonly known s:  PLAVIX Tke 1 tblet (75 mg totl) by mouth dily.   lisinopril 40 MG tblet Commonly known s:  PRINIVIL,ZESTRIL Tke 1 tblet (40 mg totl) by mouth dily.   nitroGLYCERIN 0.4 MG SL tblet Commonly known s:  NITROSTAT Plce 1 tblet (0.4 mg totl) under the tongue every 5 (five) minutes s needed for up to 3 doses for chest pin.   rivroxbn 20 MG Tbs tblet Commonly known s:  XARELTO Tke 1 tblet (20 mg totl) by mouth dily.   rosuvsttin 40 MG tblet Commonly known s:  CRESTOR Tke 1 tblet (40 mg totl) by mouth dily t 6 PM for 30 dys.   SLOW FE PO Tke 1 tblet by mouth dily.      Allergies  Allergen Rections  . Prednisone Other (See Comments)    No energy "stlls out".    Mkes pt  Jittery.  . Trmdol Other (See Comments)    Constiption,  Mkes pt  Jittery.   Follow-up  Informtion    Erlene Qun, PA-C Follow up on 09/24/2018.   Specilties:  Crdiology, Rdiology Why:  Your follow ppointment **Note De-Identified vi Obfusction** 09/24/2018 t 0900m  Contct informtion: [address] [place] [address] [phone]        Billie Ruddy, MD Follow up.   Specilty:  Fm[facility] Medicine Why:  Cll for  follow up ppointment Contct informtion: [place] [address] [phone]            The results of significnt dignostics from this hospitliztion (including imging, microbiology, ncillry nd lbortory) re listed below for reference.    Significnt Dignostic Studies: Dg Chest 2 View  Result Dte: 09/12/2018 CLINICAL DATA:  Chest pin for severl dys EXAM: CHEST - 2 VIEW COMPARISON:  09/10/18 FINDINGS: Crdic shdow is stble. Aortic clcifictions re gin seen. The lungs re cler bilterlly. No cute bony bnormlity is noted. IMPRESSION: No cute bnormlity noted.  No chnge from the prior exm. Electroniclly Signed   By: Inez Ctlin M.D.   On: 09/12/2018 16:50    Microbiology: No results found for this or ny previous visit (from the pst 240 hour(s)).   Lbs: Bsic Metbolic Pnel: Recent Lbs  Lb 09/12/18 1613 09/12/18 2004 09/13/18 2355 09/14/18 0529 09/15/18 0247  NA 137  --  137 138 139  K 3.0*  --  4.5 3.9 4.4  CL 104  --  104 108 106  CO2 23  --  23 22 22   GLUCOSE 130*  --  99 101* 101*  BUN 8  --  9 11 8   CREATININE 0.70  --  0.79 0.70 0.67  CALCIUM 9.9  --  9.2 9.1 9.0  MG  --  2.0  --   --   --    Liver Function Tests: No results for input(s): AST, ALT, ALKPHOS, BILITOT, PROT, ALBUMIN in the lst 168 hours. No results for input(s): LIPASE, AMYLASE in the lst 168 hours. No results for input(s): AMMONIA in the lst 168 hours. CBC: Recent Lbs  Lb 09/12/18 1613 09/13/18 0144 09/13/18 0608 09/14/18 0529 09/15/18 0247  WBC 11.4* 10.0 8.3 8.4 8.4  NEUTROABS 8.0*  --   --   --   --    HGB 13.9 12.8 12.5 11.9* 11.1*  HCT 43.4 40.0 39.0 36.3 35.4*  MCV 88.8 88.1 88.8 89.2 89.8  PLT 406* 369 367 340 295   Crdic Enzymes: Recent Lbs  Lb 09/12/18 2004 09/13/18 0144 09/13/18 0608  TROPONINI 0.38* 0.54* 0.50*   BNP: BNP (lst 3 results) No results for input(s): BNP in the lst 8760 hours.  ProBNP (lst 3 results) No results for input(s): PROBNP in the lst 8760 hours.  CBG: No results for input(s): GLUCAP in the lst 168 hours.     Signed:  Fyrene Helper MD.  [facility] 09/15/2018, 9:36 AM

## 2018-09-15 NOTE — Progress Notes (Signed)
CARDIAC REHAB PHASE I   PRE:  Rate/Rhythm: 56 SR  BP:  Supine:   Sitting: 169/80  Standing:    SaO2:   MODE:  Ambulation: 600 ft   POST:  Rate/Rhythm: 83 SR  BP:  Supine:   Sitting: 183/72  Standing:    SaO2:  0805-0900 Pt walked 600 ft on RA with steady gait and no CP. Tolerated well except elevated BP. MI education completed with pt and husband who voiced understanding. Stressed importance of plavix with stent. Reviewed NTG use, MI restrictions, heart healthy food choices and low sodium, ex ed and smoking cessation. Gave smoking cessation handout and encouraged to call 1800quitnow. Husband stated he is going to quit with her. Discussed CRP 2 and referred to [facility]. Pt stated she will ex on her own as would be too far to drive and continue with her work hours. RN made aware of BP.   Graylon Good, RN BSN  09/15/2018 8:55 AM

## 2018-09-15 NOTE — Progress Notes (Signed)
Progress Note  Patient Name: [name] [name] Date of Encounter: 09/15/2018  Primary Cardiologist: Dr. Ellyn Hack  Subjective   Pt feeling well today. Walked with cardiac rehab without complication.  Denies recurrent chest pain. Bruising to right cath site   Inpatient Medications    Scheduled Meds: . aspirin EC  81 mg Oral Daily  . clopidogrel  75 mg Oral Daily  . lisinopril  40 mg Oral Daily  . metoprolol tartrate  25 mg Oral BID  . nitroGLYCERIN  1 inch Topical Q6H  . rivaroxaban  20 mg Oral Q supper  . rosuvastatin  40 mg Oral q1800  . sodium chloride flush  3 mL Intravenous Q12H  . sodium chloride flush  3 mL Intravenous Q12H   Continuous Infusions: . sodium chloride    . sodium chloride     PRN Meds: sodium chloride, sodium chloride, acetaminophen **OR** acetaminophen, nitroGLYCERIN, ondansetron **OR** ondansetron (ZOFRAN) IV, sodium chloride flush, sodium chloride flush   Vital Signs    Vitals:   09/14/18 1920 09/14/18 2050 09/15/18 0450 09/15/18 0820  BP: 140/77 135/74 (!) 154/81 (!) 183/72  Pulse: 61 64 63 75  Resp: 17 17 18  (!) 22  Temp:  98.2 F (36.8 C) 97.7 F (36.5 C) 98.2 F (36.8 C)  TempSrc:  Oral Oral Oral  SpO2: 100% 100% 97% 97%  Weight:   69.9 kg     Intake/Output Summary (Last 24 hours) at 09/15/2018 0851 Last data filed at 09/15/2018 0700 Gross per 24 hour  Intake 848.29 ml  Output 150 ml  Net 698.29 ml   Filed Weights   09/13/18 2353 09/14/18 0533 09/15/18 0450  Weight: 68.7 kg 69.1 kg 69.9 kg   Physical Exam   General: Well developed, well nourished, NAD Skin: Warm, dry, intact. Bruising to right cath site, no hematoma Head: Normocephalic, atraumatic, clear, moist mucus membranes. Neck: Negative for carotid bruits. No JVD Lungs:Clear to ausculation bilaterally. No wheezes, rales, or rhonchi. Breathing is unlabored. Cardiovascular: RRR with S1 S2. No murmurs, rubs, gallops, or LV heave appreciated. Abdomen: Soft,  non-tender, non-distended with normoactive bowel sounds. No obvious abdominal masses. MSK: Strength and tone appear normal for age. 5/5 in all extremities Extremities: No edema. No clubbing or cyanosis. DP/PT pulses 2+ bilaterally Neuro: Alert and oriented. No focal deficits. No facial asymmetry. MAE spontaneously. Psych: Responds to questions appropriately with normal affect.    Labs    Chemistry Recent Labs  Lab 09/13/18 0608 09/14/18 0529 09/15/18 0247  NA 137 138 139  K 4.5 3.9 4.4  CL 104 108 106  CO2 23 22 22   GLUCOSE 99 101* 101*  BUN 9 11 8   CREATININE 0.79 0.70 0.67  CALCIUM 9.2 9.1 9.0  GFRNONAA >60 >60 >60  GFRAA >60 >60 >60  ANIONGAP 10 8 11      Hematology Recent Labs  Lab 09/13/18 0608 09/14/18 0529 09/15/18 0247  WBC 8.3 8.4 8.4  RBC 4.39 4.07 3.94  HGB 12.5 11.9* 11.1*  HCT 39.0 36.3 35.4*  MCV 88.8 89.2 89.8  MCH 28.5 29.2 28.2  MCHC 32.1 32.8 31.4  RDW 12.8 12.9 12.6  PLT 367 340 295    Cardiac Enzymes Recent Labs  Lab 09/12/18 2004 09/13/18 0144 09/13/18 0608  TROPONINI 0.38* 0.54* 0.50*    Recent Labs  Lab 09/12/18 1611  TROPIPOC 0.27*     BNPNo results for input(s): BNP, PROBNP in the last 168 hours.   DDimer No results for input(s):  DDIMER in the last 168 hours.   Radiology    No results found.  Telemetry    09/06/2018 NSR - Personally Reviewed  ECG    No new tracing as of 09/15/2018- Personally Reviewed  Cardiac Studies   Cardiac catheterization 90/30/0923:  LV end diastolic pressure is normal.  Prox Cx to Mid Cx lesion is 99% stenosed (CULPRIT LESION). Mid Cx lesion is 55% stenosed.  Prox LAD-1 lesion is 35% stenosed with 20% stenosed side branch in Ost 1st Diag. Prox LAD-2 lesion is 80% stenosed with 60% stenosed side branch in Ost 1st Sept.  Prox RCA lesion is 70% stenosed.   SUMMARY  Severe three-vessel disease with culprit lesion being 99% mid circumflex stenosis (followed by 60% lesion just beyond it),  additional 80% proximal to mid LAD and likely 70% proximal RCA.  Normal LVEDP. Reduced EF by echocardiogram with global hypokinesis.  Stent intervention 09/14/2018:  Prox LAD-1 lesion is 35% stenosed with 20% stenosed side branch in Ost 1st Diag.  Prox LAD-2 lesion is 80% stenosed with 90% stenosed side branch in Ost 1st Sept.  A drug-eluting stent was successfully placed using a STENT RESOLUTE ONYX 3.0X22.  Post intervention, there is a 0% residual stenosis.  Prox Cx to Mid Cx lesion is 99% stenosed.  Mid Cx lesion is 55% stenosed.  A drug-eluting stent was successfully placed using a STENT RESOLUTE ONYX 3.5X30.  Post intervention, there is a 0% residual stenosis.  Prox RCA lesion is 70% stenosed. DFR was 0.99   1. Successful PCI of the mid LCx with DES x 1 2. Successful PCI of the proximal to mid LAD with DES x 1. 3. Proximal RCA lesion with normal DFR of 0.99  Plan: ASA 81 mg daily for one month. Plavix 75 mg daily for one year. Plan resuming Xarelto 20 mg daily tomorrow. Anticipate DC tomorrow.   Patient Profile     63 y.o. female w/ h/o lupus anticoagulant and h/o DVT/PE, on chronic OAC, h/o HTN but no significant prior h/o CAD or other cardiac problems has been admitted after presenting to ED w/ CP  Assessment & Plan    1.  NSTEMI:  -Cath on 09/13/2018 with severe 3 vessel CAD with recommendations for TCTS evaluation>>>pt declined and chose to undergo stenting -PCI to [facility] and proximal to mid LAD 09/14/2018 -Initial troponin found to be mildly elevated with at 0.27> 0.38> 0.54> 0.50 with no acute EKG changes -No recurrent chest pain -Mild bruising to right cath site>>>post cath instructions given with understanding  -Continue ASA, lisinopril 40, -Will change metoprolol to carvedilol 6.25 given low normal HR in the 60's   2. HTN:  -Stable, will change metoprolol to carvedilol   -Continue lisinopril 40, carvedilol 6.25mg    3.  History of DVT/PE with lupus  anticoagulant: -Patient on home Xarelto>>> restart today, 09/15/2018 -Plan: ASA 81 mg daily for one month. Plavix 75 mg daily for one year. Plan resuming Xarelto 20 mg daily today   5.  Tobacco use: -Smoking cessation strongly encouraged  Signed, Kathyrn Drown NP-C [place] Pager: [phone] 09/15/2018, 8:51 AM     For questions or updates, please contact   Please consult [email or web address] for contact info under Cardiology/STEMI.

## 2018-09-15 NOTE — Telephone Encounter (Signed)
Patient is currently is admitted

## 2018-09-15 NOTE — Telephone Encounter (Signed)
TOC Patient-   Please call Patient-   Pt has an appointment with Kerin Ransom on 09-24-18.

## 2018-09-16 NOTE — Telephone Encounter (Signed)
TOC- 1st attempt. lmtcb  

## 2018-09-17 NOTE — Telephone Encounter (Signed)
Patient contacted regarding discharge from [facility] 09/15/18.  Patient understands to follow up with provider Kerin Ransom PA  on 09/24/18 at 9:00 am at [facility] Patient understands discharge instructions? yes  Patient understands medications and regiment? yes Patient understands to bring all medications to this visit? yes Only complaint a little fatigue other than that doing well

## 2018-09-17 NOTE — Telephone Encounter (Signed)
TOC-Left message for pt to call

## 2018-09-17 NOTE — Telephone Encounter (Signed)
New Message     Patient returning missed phone call.

## 2018-09-24 ENCOUNTER — Ambulatory Visit: Payer: BLUE CROSS/BLUE SHIELD | Admitting: Cardiology

## 2018-09-24 ENCOUNTER — Encounter: Payer: Self-pay | Admitting: Cardiology

## 2018-09-24 VITALS — BP 122/72 | HR 83 | Ht 64.5 in | Wt 154.0 lb

## 2018-09-24 DIAGNOSIS — I2510 Atherosclerotic heart disease of native coronary artery without angina pectoris: Secondary | ICD-10-CM

## 2018-09-24 DIAGNOSIS — Z9861 Coronary angioplasty status: Secondary | ICD-10-CM

## 2018-09-24 DIAGNOSIS — E785 Hyperlipidemia, unspecified: Secondary | ICD-10-CM

## 2018-09-24 DIAGNOSIS — D6859 Other primary thrombophilia: Secondary | ICD-10-CM

## 2018-09-24 DIAGNOSIS — I214 Non-ST elevation (NSTEMI) myocardial infarction: Secondary | ICD-10-CM | POA: Diagnosis not present

## 2018-09-24 DIAGNOSIS — I10 Essential (primary) hypertension: Secondary | ICD-10-CM

## 2018-09-24 DIAGNOSIS — Z86718 Personal history of other venous thrombosis and embolism: Secondary | ICD-10-CM

## 2018-09-24 DIAGNOSIS — Z720 Tobacco use: Secondary | ICD-10-CM | POA: Diagnosis not present

## 2018-09-24 MED ORDER — CARVEDILOL 6.25 MG PO TABS: 6.2500 mg | ORAL_TABLET | Freq: Two times a day (BID) | ORAL | 6 refills | Status: DC

## 2018-09-24 MED ORDER — CLOPIDOGREL BISULFATE 75 MG PO TABS: 75.0000 mg | ORAL_TABLET | Freq: Every day | ORAL | 6 refills | Status: AC

## 2018-09-24 MED ORDER — ROSUVASTATIN CALCIUM 10 MG PO TABS: 10.0000 mg | ORAL_TABLET | Freq: Every day | ORAL | 6 refills | Status: DC

## 2018-09-24 NOTE — Progress Notes (Signed)
Transitions of Care Follow Up Call Note  [name] [name] is an 63 y.o. female who presented to [facility] on 09/12/2018.  The patient had the following prescriptions filled at [facility]: aspirin, carvedilol, rosuvastatin, and clopidogrel  Pharmacist comments: only medication with refills left was clopidogrel  [x]  Patient unable to be reached after calling three times and prescriptions filled at the [facility] Transitions of Care Pharmacy were transferred to preferred pharmacy found within their chart: [facility] 09/24/2018, 5:29 PM Transitions of Care Pharmacy Hours: Monday - Friday 8:30am to 5:00 PM  Phone - [phone]

## 2018-09-24 NOTE — Assessment & Plan Note (Signed)
Controlled.  

## 2018-09-24 NOTE — Patient Instructions (Addendum)
Medication Instructions:  DECREASE Crestor to 10mg  take 1 tablet once a day  STOP Aspirin 10/14/2018 If you need a refill on your cardiac medications before your next appointment, please call your pharmacy.   Lab work: None  If you have labs (blood work) drawn today and your tests are completely normal, you will receive your results only by: Marland Kitchen MyChart Message (if you have MyChart) OR . A paper copy in the mail If you have any lab test that is abnormal or we need to change your treatment, we will call you to review the results.  Testing/Procedures: None   Follow-Up: At [facility], you and your health needs are our priority.  As part of our continuing mission to provide you with exceptional heart care, we have created designated Provider Care Teams.  These Care Teams include your primary Cardiologist (physician) and Advanced Practice Providers (APPs -  Physician Assistants and Nurse Practitioners) who all work together to provide you with the care you need, when you need it. You will need a follow up appointment in 3 months.You may see Dr Glenetta Hew or one of the following Advanced Practice Providers on your designated Care Team:   Rosaria Ferries, PA-C . Jory Sims, DNP, ANP  Any Other Special Instructions Will Be Listed Below (If Applicable).

## 2018-09-24 NOTE — Progress Notes (Addendum)
09/24/2018 [name] [name]   1955/10/02  [id number]  Primary Physician Billie Ruddy, MD Primary Cardiologist: Dr Ellyn Hack  HPI: [name] [name] is a pleasant 63 year old female who works in [employer].  She has a history of pulmonary embolism and DVT.  She is on chronic Xarelto and has lupus anticoagulant.  Other medical problems include dyslipidemia, hypertension, and smoking.  She originally presented to [facility] with chest pain January 23.  She was kept for most of the day, the plan was to transfer her to [facility] but as it was a Thursday and there were no beds she was discharged home with plans for follow-up.  Sunday after discharge she had recurrent chest pain and presented to [facility] ED.  Her troponin went to 0.54.  On January 27 she underwent catheterization which revealed three-vessel disease.  She underwent intervention to her circumflex and LAD with DES.  She had a residual 70% RCA that is to be treated medically.  Her ejection fraction was 45%.  She is in the office today for follow-up.  She has been walking every day.  She is not had recurrent chest pain.  Her main complaint is back pain shoulder pain and leg pain, she attributes this to Crestor.  She has continued to take it.   Current Outpatient Medications  Medication Sig Dispense Refill  . acetaminophen (TYLENOL) 325 MG tablet Take 650 mg by mouth every 6 (six) hours as needed for mild pain.    Marland Kitchen aspirin EC 81 MG EC tablet Take 1 tablet (81 mg total) by mouth daily for 30 days. 30 tablet 0  . carvedilol (COREG) 6.25 MG tablet Take 1 tablet (6.25 mg total) by mouth 2 (two) times daily with a meal for 30 days. 60 tablet 6  . clopidogrel (PLAVIX) 75 MG tablet Take 1 tablet (75 mg total) by mouth daily. 30 tablet 6  . Ferrous Sulfate (SLOW FE PO) Take 1 tablet by mouth daily.    Marland Kitchen lisinopril (PRINIVIL,ZESTRIL) 40 MG tablet Take 1 tablet (40 mg total) by mouth daily. 90 tablet 3  . nitroGLYCERIN (NITROSTAT) 0.4 MG SL  tablet Place 1 tablet (0.4 mg total) under the tongue every 5 (five) minutes as needed for up to 3 doses for chest pain. 12 tablet 0  . rivaroxaban (XARELTO) 20 MG TABS tablet Take 1 tablet (20 mg total) by mouth daily. 90 tablet 3  . rosuvastatin (CRESTOR) 10 MG tablet Take 1 tablet (10 mg total) by mouth daily at 6 PM for 30 days. 30 tablet 6   No current facility-administered medications for this visit.     Allergies  Allergen Reactions  . Prednisone Other (See Comments)    No energy "stalls out".    Makes pt  Jittery.  . Tramadol Other (See Comments)    Constipation,  Makes pt  Jittery.    Past Medical History:  Diagnosis Date  . Back pain   . DVT (deep venous thrombosis) ([place])   . Hypertension   . Lupus anticoagulant syndrome ([place])   . Pulmonary embolism [facility])     Social History   Socioeconomic History  . Marital status: Married    Spouse name: Not on file  . Number of children: Not on file  . Years of education: Not on file  . Highest education level: Not on file  Occupational History  . Not on file  Social Needs  . Financial resource strain: Not on file  . Food insecurity:  Worry: Not on file    Inability: Not on file  . Transportation needs:    Medical: Not on file    Non-medical: Not on file  Tobacco Use  . Smoking status: Current Every Day Smoker    Packs/day: 0.50    Years: 29.00    Pack years: 14.50    Types: Cigarettes  . Smokeless tobacco: Never Used  Substance and Sexual Activity  . Alcohol use: Yes    Comment: Occasional  . Drug use: No  . Sexual activity: Not on file  Lifestyle  . Physical activity:    Days per week: Not on file    Minutes per session: Not on file  . Stress: Not on file  Relationships  . Social connections:    Talks on phone: Not on file    Gets together: Not on file    Attends religious service: Not on file    Active member of club or organization: Not on file    Attends meetings of clubs or organizations: Not on  file    Relationship status: Not on file  . Intimate partner violence:    Fear of current or ex partner: Not on file    Emotionally abused: Not on file    Physically abused: Not on file    Forced sexual activity: Not on file  Other Topics Concern  . Not on file  Social History Narrative  . Not on file     Family History  Problem Relation Age of Onset  . Leukemia Mother   . Prostate cancer Father   . Cancer Father      Review of Systems: General: negative for chills, fever, night sweats or weight changes.  Cardiovascular: negative for chest pain, dyspnea on exertion, edema, orthopnea, palpitations, paroxysmal nocturnal dyspnea or shortness of breath Dermatological: negative for rash Respiratory: negative for cough or wheezing Urologic: negative for hematuria Abdominal: negative for nausea, vomiting, diarrhea, bright red blood per rectum, melena, or hematemesis Neurologic: negative for visual changes, syncope, or dizziness All other systems reviewed and are otherwise negative except as noted above.    Blood pressure 122/72, pulse 83, height 5' 4.5" (1.638 m), weight 154 lb (69.9 kg).  General appearance: alert, cooperative and no distress Neck: no carotid bruit and no JVD Lungs: clear to auscultation bilaterally Heart: regular rate and rhythm Extremities: Rt radial site ecchymotic Skin: warm and dry Neurologic: Grossly normal  EKG NSR, TWI 2,3 AVF and V5-V6  ASSESSMENT AND PLAN:   Non-ST elevation (NSTEMI) myocardial infarction [facility]) Presented 09/13/2018- Troponin peaked-0.54  CAD -S/P PCI S/P CFX and LAD PCI with DES 09/14/2018-residual 70% RCA EF 45%  Dyslipidemia, goal LDL below 70 Her LDL was 200!Marland Kitchen She is having myalgias with Crestor 40 mg  History of DVT of lower extremity H/O DVT and PE-  LLE DVT 04/2012- chronic [place] H/O Lupus anticoagulant  Essential hypertension Controlled  Tobacco use She has cut back- we discussed stopping altogether-she's not  ready    PLAN  Stop ASA Feb 27th.  Decrease Crestor to 10 mg. F/U in 3 months with Dr Harding-check lipids then, I suspect she will need PCSK9.   Kerin Ransom PA-C 09/24/2018 9:08 AM

## 2018-09-24 NOTE — Assessment & Plan Note (Signed)
H/O DVT and PE-  LLE DVT 04/2012- chronic [facility] H/O Lupus anticoagulant

## 2018-09-24 NOTE — Assessment & Plan Note (Addendum)
Her LDL was 200!Marland Kitchen She is having myalgias with Crestor 40 mg

## 2018-09-24 NOTE — Assessment & Plan Note (Signed)
Presented 09/13/2018- Troponin peaked-0.54

## 2018-09-24 NOTE — Assessment & Plan Note (Signed)
She has cut back- we discussed stopping altogether-she's not ready

## 2018-09-24 NOTE — Assessment & Plan Note (Signed)
S/P CFX and LAD PCI with DES 09/14/2018-residual 70% RCA EF 45%

## 2018-09-27 ENCOUNTER — Telehealth: Payer: Self-pay | Admitting: Cardiology

## 2018-09-27 NOTE — Telephone Encounter (Signed)
Sounds like she should see her PCP- ? flu

## 2018-09-27 NOTE — Telephone Encounter (Signed)
New Message:    Patient calling because of some medication issue. Patient states she did not go to work today, because she dose not have any energy. Please call patient back.

## 2018-09-27 NOTE — Telephone Encounter (Signed)
Advised patient, verbalized understanding  

## 2018-09-27 NOTE — Telephone Encounter (Signed)
Spoke with patient and she stated was 09/24/18 and had been doing well. Saturday she felt fine but yesterday and today she feels like she has no energy at all and can hardly get up. Blood pressure yesterday 124/75 HR 80's and this am blood pressure 136/56 HR 80. Yesterday she did have some numbness in her left thumb and index finger that only lasted a few minutes, has had in the past. Patient denies any other symptoms. At visit the only change was decreasing Crestor from 40 mg daily to 10 mg daily secondary to aching. Advised patient that decreasing Crestor should not be causing these symptoms but will forward to [place] for review.

## 2018-09-28 ENCOUNTER — Other Ambulatory Visit: Payer: Self-pay

## 2018-09-28 ENCOUNTER — Emergency Department (HOSPITAL_COMMUNITY): Payer: BLUE CROSS/BLUE SHIELD

## 2018-09-28 ENCOUNTER — Encounter (HOSPITAL_COMMUNITY): Payer: Self-pay

## 2018-09-28 ENCOUNTER — Inpatient Hospital Stay (HOSPITAL_COMMUNITY)
Admission: EM | Admit: 2018-09-28 | Discharge: 2018-10-01 | DRG: 812 | Disposition: A | Discharge status: Home / Self Care | Payer: BLUE CROSS/BLUE SHIELD | Attending: Internal Medicine | Admitting: Internal Medicine

## 2018-09-28 DIAGNOSIS — K635 Polyp of colon: Secondary | ICD-10-CM | POA: Diagnosis present

## 2018-09-28 DIAGNOSIS — D62 Acute posthemorrhagic anemia: Secondary | ICD-10-CM | POA: Diagnosis not present

## 2018-09-28 DIAGNOSIS — K449 Diaphragmatic hernia without obstruction or gangrene: Secondary | ICD-10-CM | POA: Diagnosis present

## 2018-09-28 DIAGNOSIS — K5710 Diverticulosis of small intestine without perforation or abscess without bleeding: Secondary | ICD-10-CM | POA: Diagnosis not present

## 2018-09-28 DIAGNOSIS — Z86718 Personal history of other venous thrombosis and embolism: Secondary | ICD-10-CM

## 2018-09-28 DIAGNOSIS — Z86711 Personal history of pulmonary embolism: Secondary | ICD-10-CM

## 2018-09-28 DIAGNOSIS — F17210 Nicotine dependence, cigarettes, uncomplicated: Secondary | ICD-10-CM | POA: Diagnosis present

## 2018-09-28 DIAGNOSIS — Z79899 Other long term (current) drug therapy: Secondary | ICD-10-CM | POA: Diagnosis not present

## 2018-09-28 DIAGNOSIS — K922 Gastrointestinal hemorrhage, unspecified: Secondary | ICD-10-CM | POA: Diagnosis present

## 2018-09-28 DIAGNOSIS — R079 Chest pain, unspecified: Secondary | ICD-10-CM | POA: Diagnosis not present

## 2018-09-28 DIAGNOSIS — D649 Anemia, unspecified: Secondary | ICD-10-CM | POA: Diagnosis not present

## 2018-09-28 DIAGNOSIS — Z7901 Long term (current) use of anticoagulants: Secondary | ICD-10-CM

## 2018-09-28 DIAGNOSIS — I252 Old myocardial infarction: Secondary | ICD-10-CM

## 2018-09-28 DIAGNOSIS — Z8042 Family history of malignant neoplasm of prostate: Secondary | ICD-10-CM | POA: Diagnosis not present

## 2018-09-28 DIAGNOSIS — I2510 Atherosclerotic heart disease of native coronary artery without angina pectoris: Secondary | ICD-10-CM

## 2018-09-28 DIAGNOSIS — K5520 Angiodysplasia of colon without hemorrhage: Secondary | ICD-10-CM | POA: Diagnosis present

## 2018-09-28 DIAGNOSIS — I10 Essential (primary) hypertension: Secondary | ICD-10-CM | POA: Diagnosis present

## 2018-09-28 DIAGNOSIS — Z7902 Long term (current) use of antithrombotics/antiplatelets: Secondary | ICD-10-CM

## 2018-09-28 DIAGNOSIS — Z7982 Long term (current) use of aspirin: Secondary | ICD-10-CM

## 2018-09-28 DIAGNOSIS — Z806 Family history of leukemia: Secondary | ICD-10-CM

## 2018-09-28 DIAGNOSIS — K648 Other hemorrhoids: Secondary | ICD-10-CM | POA: Diagnosis not present

## 2018-09-28 DIAGNOSIS — Z9861 Coronary angioplasty status: Secondary | ICD-10-CM | POA: Diagnosis not present

## 2018-09-28 DIAGNOSIS — K5730 Diverticulosis of large intestine without perforation or abscess without bleeding: Secondary | ICD-10-CM | POA: Diagnosis not present

## 2018-09-28 DIAGNOSIS — D122 Benign neoplasm of ascending colon: Secondary | ICD-10-CM | POA: Diagnosis not present

## 2018-09-28 DIAGNOSIS — D123 Benign neoplasm of transverse colon: Secondary | ICD-10-CM | POA: Diagnosis not present

## 2018-09-28 DIAGNOSIS — D6859 Other primary thrombophilia: Secondary | ICD-10-CM

## 2018-09-28 DIAGNOSIS — R195 Other fecal abnormalities: Secondary | ICD-10-CM | POA: Diagnosis not present

## 2018-09-28 DIAGNOSIS — Z955 Presence of coronary angioplasty implant and graft: Secondary | ICD-10-CM

## 2018-09-28 DIAGNOSIS — I25118 Atherosclerotic heart disease of native coronary artery with other forms of angina pectoris: Secondary | ICD-10-CM | POA: Diagnosis not present

## 2018-09-28 DIAGNOSIS — R0600 Dyspnea, unspecified: Secondary | ICD-10-CM | POA: Diagnosis not present

## 2018-09-28 DIAGNOSIS — D6862 Lupus anticoagulant syndrome: Secondary | ICD-10-CM | POA: Diagnosis present

## 2018-09-28 DIAGNOSIS — Z885 Allergy status to narcotic agent status: Secondary | ICD-10-CM | POA: Diagnosis not present

## 2018-09-28 DIAGNOSIS — Z888 Allergy status to other drugs, medicaments and biological substances status: Secondary | ICD-10-CM | POA: Diagnosis not present

## 2018-09-28 DIAGNOSIS — R9431 Abnormal electrocardiogram [ECG] [EKG]: Secondary | ICD-10-CM | POA: Diagnosis not present

## 2018-09-28 LAB — BASIC METABOLIC PANEL
Anion gap: 10 (ref 5–15)
BUN: 17 mg/dL (ref 8–23)
CO2: 22 mmol/L (ref 22–32)
Calcium: 9.4 mg/dL (ref 8.9–10.3)
Chloride: 105 mmol/L (ref 98–111)
Creatinine, Ser: 0.76 mg/dL (ref 0.44–1.00)
GFR calc Af Amer: >60 mL/min (ref >60)
GFR calc non Af Amer: >60 mL/min (ref >60)
Glucose, Bld: 112 mg/dL — ABNORMAL HIGH (ref 70–99)
Potassium: 4.2 mmol/L (ref 3.5–5.1)
Sodium: 137 mmol/L (ref 135–145)

## 2018-09-28 LAB — I-STAT TROPONIN, ED: Troponin i, poc: 0.03 ng/mL (ref 0.00–0.08)

## 2018-09-28 LAB — CBC
HCT: 28.1 % — ABNORMAL LOW (ref 36.0–46.0)
Hemoglobin: 8.7 g/dL — ABNORMAL LOW (ref 12.0–15.0)
MCH: 28.6 pg (ref 26.0–34.0)
MCHC: 31 g/dL (ref 30.0–36.0)
MCV: 92.4 fL (ref 80.0–100.0)
Platelets: 590 10*3/uL — ABNORMAL HIGH (ref 150–400)
RBC: 3.04 MIL/uL — ABNORMAL LOW (ref 3.87–5.11)
RDW: 12.9 % (ref 11.5–15.5)
WBC: 13 10*3/uL — ABNORMAL HIGH (ref 4.0–10.5)
nRBC: 0 % (ref 0.0–0.2)

## 2018-09-28 LAB — PREPARE RBC (CROSSMATCH)

## 2018-09-28 LAB — POC OCCULT BLOOD, ED: Fecal Occult Bld: POSITIVE — AB

## 2018-09-28 MED ORDER — SODIUM CHLORIDE 0.9 % IV SOLN: 10.0000 mL/h | Freq: Once | INTRAVENOUS | Status: DC

## 2018-09-28 MED ORDER — SODIUM CHLORIDE 0.9% FLUSH: 3.0000 mL | Freq: Once | INTRAVENOUS | Status: DC

## 2018-09-28 MED ORDER — CLOPIDOGREL BISULFATE 75 MG PO TABS
75.0000 mg | ORAL_TABLET | Freq: Every day | ORAL | Status: DC
  Administered 2018-09-29: 75 mg via ORAL
  Filled 2018-09-28: qty 1

## 2018-09-28 MED ORDER — ASPIRIN EC 81 MG PO TBEC
81.0000 mg | DELAYED_RELEASE_TABLET | Freq: Every day | ORAL | Status: DC
  Administered 2018-09-29: 81 mg via ORAL
  Filled 2018-09-28: qty 1

## 2018-09-28 MED ORDER — ONDANSETRON HCL 4 MG/2ML IJ SOLN: 4.0000 mg | Freq: Four times a day (QID) | INTRAMUSCULAR | Status: DC | PRN

## 2018-09-28 MED ORDER — ONDANSETRON HCL 4 MG PO TABS: 4.0000 mg | ORAL_TABLET | Freq: Four times a day (QID) | ORAL | Status: DC | PRN

## 2018-09-28 MED ORDER — SODIUM CHLORIDE 0.9% IV SOLUTION: Freq: Once | INTRAVENOUS | Status: DC

## 2018-09-28 MED ORDER — ROSUVASTATIN CALCIUM 5 MG PO TABS
10.0000 mg | ORAL_TABLET | Freq: Every day | ORAL | Status: DC
  Administered 2018-09-29 – 2018-09-30 (×2): 10 mg via ORAL
  Filled 2018-09-28 (×2): qty 2

## 2018-09-28 MED ORDER — NICOTINE 14 MG/24HR TD PT24: 14.0000 mg | MEDICATED_PATCH | Freq: Every day | TRANSDERMAL | Status: DC | PRN

## 2018-09-28 MED ORDER — FENTANYL CITRATE (PF) 100 MCG/2ML IJ SOLN: 25.0000 ug | INTRAMUSCULAR | Status: DC | PRN

## 2018-09-28 NOTE — ED Triage Notes (Signed)
Pt presents for evaluation of chest pain starting today to L chest/axilla area. Pt reports she had stents placed 2 weeks ago, felt better until Sunday when she started feeling weak.

## 2018-09-28 NOTE — H&P (Signed)
History and Physical    PLEASE NOTE THAT DRAGON DICTATION SOFTWARE WAS USED IN THE CONSTRUCTION OF THIS NOTE.   [name] [name] NAT:[id number] DOB: [birth year] DOA: 09/28/2018  PCP: Billie Ruddy, MD Patient coming from: home  I have personally briefly reviewed patient's old medical records in [place]  Chief Complaint: shortness of breath  HPI: [name] [name] is a 63 y.o. female with medical history significant for coronary artery disease status post recent placement of drug-eluting stents x2, lupus anticoagulant syndrome chronically anticoagulated on Xarelto, chronic iron deficiency anemia, who was admitted to [facility] on 09/28/2018 with acute on chronic anemia after presenting from home to the [facility] emergency department complaining of shortness of breath.   The patient was recently hospitalized at [facility] for an STEMI, during which she underwent drug-eluting stent x1 to the mid left circumflex as well as drug-eluting stent x1 to the mid LAD on 09/06/2018.  She was started on dual antiplatelet therapy with recommended duration of such for 1 month, at which time she was instructed to discontinue aspirin while continuing Plavix for a total of 1 year.  In addition to dual antiplatelet therapy, the patient is also chronically anticoagulated on Xarelto in the context of a history of lupus anticoagulant syndrome resulting in pulmonary embolism as well as DVT 6 years ago.  She reports good compliance in taking her Xarelto, and denies any subsequent thromboembolic event.  The patient presents to [facility] emergency department today complaining of 2 to 3 days of shortness of breath with exertion.  She denies any associated chest pain, palpitations, diaphoresis, dizziness, or syncope.  She also denies any associated orthopnea, PND, or peripheral edema.  Not associated with any subjective fever, chills, rigors, or generalized myalgias.  Denies any associated  nausea, vomiting, diarrhea, or abdominal pain denies any hematemesis, melena, or hematochezia.  The patient conveys that her most recent colonoscopy occurred 6 years ago.   ED Course: Labs performed in the ED were notable for the following: Temperature max 98.8; heart rate 70-84; blood pressure ranged from 124/64-130 5/59; respiratory rate 14-17; oxygen saturation 98 to 100% on room air.  Labs performed in the ED were notable for the following: BMP notable for BUN 17 compared to 8 on 09/15/2018, creatinine 0.76 compared to 0.67 on 09/15/2018.  CBC notable for hemoglobin 8.7 compared to most recent prior hemoglobin data point of 11.1 on 09/15/2018, MCV 92.4, and CHC 31.  Troponin x1-.  DRE was associated with finding of fecal occult blood positive. 2 view chest x-ray, per final radiology report and is very no evidence of acute cardiopulmonary process.  While still in the emergency department, the patient was typed, crossed, and started on transfusion of 2 units PRBC.  Subsequently, the patient was admitted to the med telemetry floor for further evaluation and management of presenting acute on chronic anemia in the setting of suspected acute lower gastrointestinal bleed.    Review of Systems: As per HPI otherwise 10 point review of systems negative.   Past Medical History:  Diagnosis Date  . Back pain   . DVT (deep venous thrombosis) ([place])   . Hypertension   . Lupus anticoagulant syndrome ([place])   . Pulmonary embolism [facility])     Past Surgical History:  Procedure Laterality Date  . BACK SURGERY    . COLONOSCOPY  05/19/2012   Procedure: COLONOSCOPY;  Surgeon: Beryle Beams, MD;  Location: [facility];  Service: Endoscopy;  Laterality:  N/A;  . CORONARY STENT INTERVENTION N/A 09/14/2018   Procedure: CORONARY STENT INTERVENTION;  Surgeon: Martinique, Peter M, MD;  Location: [place] CV LAB;  Service: Cardiovascular;  Laterality: N/A;  . ESOPHAGOGASTRODUODENOSCOPY  05/19/2012   Procedure:  ESOPHAGOGASTRODUODENOSCOPY (EGD);  Surgeon: Beryle Beams, MD;  Location: [facility] ENDOSCOPY;  Service: Endoscopy;  Laterality: N/A;  . INTRAVASCULAR PRESSURE WIRE/FFR STUDY N/A 09/14/2018   Procedure: INTRAVASCULAR PRESSURE WIRE/FFR STUDY;  Surgeon: Martinique, Peter M, MD;  Location: [place] CV LAB;  Service: Cardiovascular;  Laterality: N/A;  . LEFT HEART CATH AND CORONARY ANGIOGRAPHY N/A 09/13/2018   Procedure: LEFT HEART CATH AND CORONARY ANGIOGRAPHY;  Surgeon: Leonie Man, MD;  Location: [place] CV LAB;  Service: Cardiovascular;  Laterality: N/A;    Social History:  reports that she has been smoking cigarettes. She has a 14.50 pack-year smoking history. She has never used smokeless tobacco. She reports current alcohol use. She reports that she does not use drugs.   Allergies  Allergen Reactions  . Prednisone Other (See Comments)    No energy "stalls out".    Makes pt  Jittery.  . Tramadol Other (See Comments)    Constipation,  Makes pt  Jittery.    Family History  Problem Relation Age of Onset  . Leukemia Mother   . Prostate cancer Father   . Cancer Father      Prior to Admission medications   Medication Sig Start Date End Date Taking? Authorizing Provider  acetaminophen (TYLENOL) 500 MG tablet Take 1,000 mg by mouth every 6 (six) hours as needed for mild pain.    Yes [provider]  aspirin EC 81 MG EC tablet Take 1 tablet (81 mg total) by mouth daily for 30 days. 09/15/18 10/15/18 Yes Elodia Florence., MD  bisacodyl (DULCOLAX) 5 MG EC tablet Take 5 mg by mouth daily as needed for moderate constipation.   Yes [provider]  carvedilol (COREG) 6.25 MG tablet Take 1 tablet (6.25 mg total) by mouth 2 (two) times daily with a meal for 30 days. 09/24/18 10/24/18 Yes Kilroy, Doreene Burke, PA-C  clopidogrel (PLAVIX) 75 MG tablet Take 1 tablet (75 mg total) by mouth daily. 09/24/18 11/23/18 Yes Kilroy, Doreene Burke, PA-C  Ferrous Sulfate (SLOW FE PO) Take 1 tablet by mouth  daily.   Yes [provider]  lisinopril (PRINIVIL,ZESTRIL) 40 MG tablet Take 1 tablet (40 mg total) by mouth daily. 09/08/18  Yes Billie Ruddy, MD  nitroGLYCERIN (NITROSTAT) 0.4 MG SL tablet Place 1 tablet (0.4 mg total) under the tongue every 5 (five) minutes as needed for up to 3 doses for chest pain. 09/15/18  Yes Elodia Florence., MD  rivaroxaban (XARELTO) 20 MG TABS tablet Take 1 tablet (20 mg total) by mouth daily. 11/26/17  Yes Hawks, Christy A, FNP  rosuvastatin (CRESTOR) 10 MG tablet Take 1 tablet (10 mg total) by mouth daily at 6 PM for 30 days. 09/24/18 10/24/18 Yes Erlene Quan, PA-C      Objective    Physical Exam: Vitals:   09/28/18 1715 09/28/18 1730 09/28/18 1745 09/28/18 1832  BP: (!) 128/50 (!) 138/91 (!) 133/51 111/80  Pulse: 70  74 86  Resp: 14  15 17   Temp:    98.8 F (37.1 C)  TempSrc:    Oral  SpO2: 99%  100% 100%    General: appears to be stated age; alert, oriented Skin: warm, dry, no rash Head:  AT/[place] Mouth:  Oral mucosa membranes appear moist, normal dentition Neck: supple; trachea midline Heart:  RRR; did not appreciate any M/R/G Lungs: CTAB, did not appreciate any wheezes, rales, or rhonchi Abdomen: + BS; soft, ND, NT Extremities: no peripheral edema, no muscle wasting   Labs on Admission: I have personally reviewed following labs and imaging studies  CBC: Recent Labs  Lab 09/28/18 1417  WBC 13.0*  HGB 8.7*  HCT 28.1*  MCV 92.4  PLT 478*   Basic Metabolic Panel: Recent Labs  Lab 09/28/18 1417  NA 137  K 4.2  CL 105  CO2 22  GLUCOSE 112*  BUN 17  CREATININE 0.76  CALCIUM 9.4   GFR: Estimated Creatinine Clearance: 69.9 mL/min (by C-G formula based on SCr of 0.76 mg/dL). Liver Function Tests: No results for input(s): AST, ALT, ALKPHOS, BILITOT, PROT, ALBUMIN in the last 168 hours. No results for input(s): LIPASE, AMYLASE in the last 168 hours. No results for input(s): AMMONIA in the last 168 hours. Coagulation  Profile: No results for input(s): INR, PROTIME in the last 168 hours. Cardiac Enzymes: No results for input(s): CKTOTAL, CKMB, CKMBINDEX, TROPONINI in the last 168 hours. BNP (last 3 results) No results for input(s): PROBNP in the last 8760 hours. HbA1C: No results for input(s): HGBA1C in the last 72 hours. CBG: No results for input(s): GLUCAP in the last 168 hours. Lipid Profile: No results for input(s): CHOL, HDL, LDLCALC, TRIG, CHOLHDL, LDLDIRECT in the last 72 hours. Thyroid Function Tests: No results for input(s): TSH, T4TOTAL, FREET4, T3FREE, THYROIDAB in the last 72 hours. Anemia Panel: No results for input(s): VITAMINB12, FOLATE, FERRITIN, TIBC, IRON, RETICCTPCT in the last 72 hours. Urine analysis:    Component Value Date/Time   COLORURINE YELLOW 09/13/2018 1418   APPEARANCEUR CLEAR 09/13/2018 1418   APPEARANCEUR Clear 12/03/2015 1122   LABSPEC 1.019 09/13/2018 1418   PHURINE 5.0 09/13/2018 1418   GLUCOSEU NEGATIVE 09/13/2018 1418   HGBUR MODERATE (A) 09/13/2018 1418   BILIRUBINUR NEGATIVE 09/13/2018 1418   BILIRUBINUR Negative 12/03/2015 1122   KETONESUR NEGATIVE 09/13/2018 1418   PROTEINUR NEGATIVE 09/13/2018 1418   UROBILINOGEN negative 06/21/2015 1654   UROBILINOGEN 0.2 05/22/2012 0254   NITRITE NEGATIVE 09/13/2018 1418   LEUKOCYTESUR NEGATIVE 09/13/2018 1418   LEUKOCYTESUR Trace (A) 12/03/2015 1122    Radiological Exams on Admission: Dg Chest 2 View  Result Date: 09/28/2018 CLINICAL DATA:  Patient had a myocardial infarction on January 23rd and had stent placed 2 weeks ago. Patient began having left arm pit pain and dyspnea today. EXAM: CHEST - 2 VIEW COMPARISON:  2620 FINDINGS: The heart size and mediastinal contours are within normal limits. Mild ectasia of the thoracic aorta without aneurysm. Both lungs are clear. The visualized skeletal structures are unremarkable. Bone island projects over the right shoulder versus an intra-articular loose body, unchanged in  appearance. IMPRESSION: No active cardiopulmonary disease. Electronically Signed   By: Ashley Royalty M.D.   On: 09/28/2018 14:57     Assessment/Plan   [name] [name] is a 63 y.o. female with medical history significant for coronary artery disease status post recent placement of drug-eluting stents x2, lupus anticoagulant syndrome chronically anticoagulated on Xarelto, chronic iron deficiency anemia, who was admitted to [facility] on 09/28/2018 with acute on chronic anemia after presenting from home to the [facility] emergency department complaining of shortness of breath.    Principal Problem:   Acute on chronic anemia Active Problems:   Essential hypertension   Acute lower GI  bleeding   Hypercoagulation syndrome (HCC)   CAD -S/P PCI   #) Acute on chronic anemia: In the setting of chronic iron deficiency anemia with baseline hemoglobin of 11-12 on chronic iron supplementation as an outpatient, the patient presents with 2 to 3 days of shortness of breath and laboratory findings revealing hemoglobin of 8.7 relative to most recent prior data point of 11.1 on 09/15/2018.  Suspect that this is on the basis of a slow lower GI bleed given fecal occult blood positive finding in the emergency department this evening in the setting of recent initiation of dual antiplatelet therapy given placement of drug-eluting stents x2 on 09/14/2018.  This is in addition to chronic anticoagulation on Xarelto in the setting of lupus anticoagulant syndrome and an associated history of pulmonary embolism as well as DVT in 2014.  Presentation has not been associated with any tachycardia, although does acknowledge the patient is on a beta-blocker at home.  No evidence of associated hypotension.  Given that the patient's stents were drug-eluting in nature, increasing the possibility of early in-stent stenosis, will continue dual antiplatelet therapy for now, which was intended to last for a total of 1 month following  placement of drug-eluting stents.  Patient conveys that her shortness of breath has resolved after receiving a little over 1 unit PRBC.   Plan: We will finish transfusing second unit PRBC, with plan to repeat hemoglobin following completion of the second unit.  N.p.o.  Will consider discussing case with gastroenterology for input regarding necessity of colonoscopy.  We will continue outpatient dual antiplatelet therapy for now, as further described above.  But, will hold home Xarelto for now.  Repeat CBC in the morning.  Will refrain from additional pharmacologic anticoagulation.  SCDs.  Monitor on telemetry.  Check INR.   #) Essential hypertension: On Coreg and lisinopril as an outpatient.  Normotensive since presentation to the emergency department.  Plan: In setting of acute on chronic anemia due to suspected acute lower gastrointestinal bleed, will hold home beta-blocker as well as lisinopril.  Close monitoring of ensuing blood pressure via routine vital signs.    #) Coronary artery disease: status post recent PCI with drug eluting stent was 1 to the mid left circumflex as well as drug-eluting stent x1 to the mid LAD, as described above.  Dual antiplatelet therapy for 1 month following which is anticipated aspirin to be discontinued while continuing Plavix for a total of 1 year, as prescribed above.  Additionally the patient is on high intensity statin therapy via rosuvastatin.  Plan: Continue dual antiplatelet therapy in the setting of recently placed drug-eluting stents, as further described in detail above.  Continue home rosuvastatin.    #) Lupus anticoagulant syndrome: Chronically anticoagulated on Xarelto.  The patient has a history of pulmonary embolism as well as DVT 6 years ago, leading to the diagnosis of lupus anticoagulant syndrome and associated initiation of Xarelto.  Subsequently, while on anticoagulation, the patient has experienced no subsequent thromboembolic events.  Plan:  We will hold home Xarelto for now in the setting of presenting acute on chronic anemia in the setting of suspected acute lower gastrointestinal bleed.    DVT prophylaxis: scd's Code Status: full Family Communication: Patient's case was discussed with her husband, who is present at bedside. Disposition Plan:  Per Rounding Team Consults called: (none)  Admission status: inpatient; med-tele.    PLEASE NOTE THAT DRAGON DICTATION SOFTWARE WAS USED IN THE CONSTRUCTION OF THIS NOTE.   Rhetta Mura  DO [facility] Pager [phone] From [place].   Otherwise, please contact night-coverage  [email or web address] Password [facility]  09/28/2018, 6:57 PM

## 2018-09-28 NOTE — ED Provider Notes (Signed)
[place] EMERGENCY DEPARTMENT Provider Note   CSN: [id number] Arrival date & time: 09/28/18  1400     History   Chief Complaint Chief Complaint  Patient presents with  . Chest Pain    HPI [name] [name] is a 63 y.o. female.  63 year old female presents with weakness times several days with some exertional dyspnea.  Patient recently had a MI with a stent placement.  Did have one episode of left-sided chest pinching was last for 30 minutes which was not relieved with nitroglycerin.  States that her symptoms were not like her prior anginal equivalent.  Denies any new leg pain or swelling.  No black or bloody stools.  Does have a prior history of anemia.  No evidence of hematemesis.  States that when she is active she feels more weak but never has any associated chest pain.  No treatment used prior to arrival     Past Medical History:  Diagnosis Date  . Back pain   . DVT (deep venous thrombosis) ([place])   . Hypertension   . Lupus anticoagulant syndrome ([place])   . Pulmonary embolism [facility])     Patient Active Problem List   Diagnosis Date Noted  . CAD -S/P PCI 09/24/2018  . Non-ST elevation (NSTEMI) myocardial infarction ([place])   . Chest pain 09/12/2018  . History of pulmonary embolus (PE) 09/12/2018  . GERD (gastroesophageal reflux disease) 12/25/2016  . Microcytic anemia 01/24/2016  . Hypertriglyceridemia 01/24/2016  . Dyslipidemia, goal LDL below 70 06/21/2015  . History of DVT of lower extremity   . Edema of left lower extremity   . Hypercoagulation syndrome ([place]) 05/19/2012  . Iron deficiency 05/19/2012  . Thrombocytosis ([place]) 05/19/2012  . Sinus tachycardia 04/21/2012  . Hypokalemia 04/20/2012  . Tobacco use 04/20/2012  . Essential hypertension 04/20/2012    Past Surgical History:  Procedure Laterality Date  . BACK SURGERY    . COLONOSCOPY  05/19/2012   Procedure: COLONOSCOPY;  Surgeon: Beryle Beams, MD;  Location: [place];   Service: Endoscopy;  Laterality: N/A;  . CORONARY STENT INTERVENTION N/A 09/14/2018   Procedure: CORONARY STENT INTERVENTION;  Surgeon: Martinique, Peter M, MD;  Location: [place] CV LAB;  Service: Cardiovascular;  Laterality: N/A;  . ESOPHAGOGASTRODUODENOSCOPY  05/19/2012   Procedure: ESOPHAGOGASTRODUODENOSCOPY (EGD);  Surgeon: Beryle Beams, MD;  Location: [facility] ENDOSCOPY;  Service: Endoscopy;  Laterality: N/A;  . INTRAVASCULAR PRESSURE WIRE/FFR STUDY N/A 09/14/2018   Procedure: INTRAVASCULAR PRESSURE WIRE/FFR STUDY;  Surgeon: Martinique, Peter M, MD;  Location: [place] CV LAB;  Service: Cardiovascular;  Laterality: N/A;  . LEFT HEART CATH AND CORONARY ANGIOGRAPHY N/A 09/13/2018   Procedure: LEFT HEART CATH AND CORONARY ANGIOGRAPHY;  Surgeon: Leonie Man, MD;  Location: [place] CV LAB;  Service: Cardiovascular;  Laterality: N/A;     OB History   No obstetric history on file.      Home Medications    Prior to Admission medications   Medication Sig Start Date End Date Taking? Authorizing Provider  acetaminophen (TYLENOL) 325 MG tablet Take 650 mg by mouth every 6 (six) hours as needed for mild pain.    [provider]  aspirin EC 81 MG EC tablet Take 1 tablet (81 mg total) by mouth daily for 30 days. 09/15/18 10/15/18  Elodia Florence., MD  carvedilol (COREG) 6.25 MG tablet Take 1 tablet (6.25 mg total) by mouth 2 (two) times daily with a meal for 30 days. 09/24/18 10/24/18  Erlene Quan, PA-C  clopidogrel (PLAVIX) 75 MG tablet Take 1 tablet (75 mg total) by mouth daily. 09/24/18 11/23/18  Erlene Quan, PA-C  Ferrous Sulfate (SLOW FE PO) Take 1 tablet by mouth daily.    [provider]  lisinopril (PRINIVIL,ZESTRIL) 40 MG tablet Take 1 tablet (40 mg total) by mouth daily. 09/08/18   Billie Ruddy, MD  nitroGLYCERIN (NITROSTAT) 0.4 MG SL tablet Place 1 tablet (0.4 mg total) under the tongue every 5 (five) minutes as needed for up to 3 doses for chest pain. 09/15/18    Elodia Florence., MD  rivaroxaban (XARELTO) 20 MG TABS tablet Take 1 tablet (20 mg total) by mouth daily. 11/26/17   Sharion Balloon, FNP  rosuvastatin (CRESTOR) 10 MG tablet Take 1 tablet (10 mg total) by mouth daily at 6 PM for 30 days. 09/24/18 10/24/18  Erlene Quan, PA-C    Family History Family History  Problem Relation Age of Onset  . Leukemia Mother   . Prostate cancer Father   . Cancer Father     Social History Social History   Tobacco Use  . Smoking status: Current Every Day Smoker    Packs/day: 0.50    Years: 29.00    Pack years: 14.50    Types: Cigarettes  . Smokeless tobacco: Never Used  Substance Use Topics  . Alcohol use: Yes    Comment: Occasional  . Drug use: No     Allergies   Prednisone and Tramadol   Review of Systems Review of Systems  All other systems reviewed and are negative.    Physical Exam Updated Vital Signs BP 134/61   Pulse 81   Temp 98.1 F (36.7 C) (Oral)   Resp 12   SpO2 98%   Physical Exam Vitals signs and nursing note reviewed.  Constitutional:      General: She is not in acute distress.    Appearance: Normal appearance. She is well-developed. She is not toxic-appearing.  HENT:     Head: Normocephalic and atraumatic.  Eyes:     General: Lids are normal.     Conjunctiva/sclera: Conjunctivae normal.     Pupils: Pupils are equal, round, and reactive to light.  Neck:     Musculoskeletal: Normal range of motion and neck supple.     Thyroid: No thyroid mass.     Trachea: No tracheal deviation.  Cardiovascular:     Rate and Rhythm: Normal rate and regular rhythm.     Heart sounds: Normal heart sounds. No murmur. No gallop.   Pulmonary:     Effort: Pulmonary effort is normal. No respiratory distress.     Breath sounds: Normal breath sounds. No stridor. No decreased breath sounds, wheezing, rhonchi or rales.  Abdominal:     General: Bowel sounds are normal. There is no distension.     Palpations: Abdomen is soft.       Tenderness: There is no abdominal tenderness. There is no rebound.  Musculoskeletal: Normal range of motion.        General: No tenderness.  Skin:    General: Skin is warm and dry.     Findings: No abrasion or rash.  Neurological:     Mental Status: She is alert and oriented to person, place, and time.     GCS: GCS eye subscore is 4. GCS verbal subscore is 5. GCS motor subscore is 6.     Cranial Nerves: No cranial nerve deficit.  Sensory: No sensory deficit.  Psychiatric:        Speech: Speech normal.        Behavior: Behavior normal.      ED Treatments / Results  Labs (all labs ordered are listed, but only abnormal results are displayed) Labs Reviewed  BASIC METABOLIC PANEL - Abnormal; Notable for the following components:      Result Value   Glucose, Bld 112 (*)    All other components within normal limits  CBC - Abnormal; Notable for the following components:   WBC 13.0 (*)    RBC 3.04 (*)    Hemoglobin 8.7 (*)    HCT 28.1 (*)    Platelets 590 (*)    All other components within normal limits  I-STAT TROPONIN, ED  POC OCCULT BLOOD, ED  TYPE AND SCREEN    EKG EKG Interpretation  Date/Time:  Tuesday September 28 2018 14:04:47 EST Ventricular Rate:  88 PR Interval:  160 QRS Duration: 78 QT Interval:  352 QTC Calculation: 425 R Axis:   67 Text Interpretation:  Normal sinus rhythm T wave abnormality, consider inferolateral ischemia Abnormal ECG t-wave changes new when compared to prior Confirmed by Lacretia Leigh ([id number]) on 09/28/2018 5:01:58 PM   Radiology Dg Chest 2 View  Result Date: 09/28/2018 CLINICAL DATA:  Patient had a myocardial infarction on January 23rd and had stent placed 2 weeks ago. Patient began having left arm pit pain and dyspnea today. EXAM: CHEST - 2 VIEW COMPARISON:  2620 FINDINGS: The heart size and mediastinal contours are within normal limits. Mild ectasia of the thoracic aorta without aneurysm. Both lungs are clear. The visualized  skeletal structures are unremarkable. Bone island projects over the right shoulder versus an intra-articular loose body, unchanged in appearance. IMPRESSION: No active cardiopulmonary disease. Electronically Signed   By: Ashley Royalty M.D.   On: 09/28/2018 14:57    Procedures Procedures (including critical care time)  Medications Ordered in ED Medications  sodium chloride flush (NS) 0.9 % injection 3 mL (has no administration in time range)     Initial Impression / Assessment and Plan / ED Course  I have reviewed the triage vital signs and the nursing notes.  Pertinent labs & imaging results that were available during my care of the patient were reviewed by me and considered in my medical decision making (see chart for details).     Patient with fecal occult positive stools.  Hemoglobin has dropped almost 4 points and patient is symptomatic with this.  1 unit of packed red blood cells ordered and will admit to the hospitalist for further evaluation  Final Clinical Impressions(s) / ED Diagnoses   Final diagnoses:  None    ED Discharge Orders    None       Lacretia Leigh, MD 09/28/18 1842

## 2018-09-28 NOTE — ED Notes (Signed)
Report given to 5W RN. All questions answered 

## 2018-09-29 ENCOUNTER — Ambulatory Visit: Payer: BLUE CROSS/BLUE SHIELD | Admitting: Cardiology

## 2018-09-29 DIAGNOSIS — I25118 Atherosclerotic heart disease of native coronary artery with other forms of angina pectoris: Secondary | ICD-10-CM

## 2018-09-29 DIAGNOSIS — R195 Other fecal abnormalities: Secondary | ICD-10-CM

## 2018-09-29 DIAGNOSIS — Z7902 Long term (current) use of antithrombotics/antiplatelets: Secondary | ICD-10-CM

## 2018-09-29 LAB — COMPREHENSIVE METABOLIC PANEL
ALT: 13 U/L (ref 0–44)
AST: 13 U/L — ABNORMAL LOW (ref 15–41)
Albumin: 3.2 g/dL — ABNORMAL LOW (ref 3.5–5.0)
Alkaline Phosphatase: 77 U/L (ref 38–126)
Anion gap: 9 (ref 5–15)
BUN: 13 mg/dL (ref 8–23)
CO2: 24 mmol/L (ref 22–32)
Calcium: 8.9 mg/dL (ref 8.9–10.3)
Chloride: 107 mmol/L (ref 98–111)
Creatinine, Ser: 0.77 mg/dL (ref 0.44–1.00)
GFR calc Af Amer: >60 mL/min (ref >60)
GFR calc non Af Amer: >60 mL/min (ref >60)
Glucose, Bld: 90 mg/dL (ref 70–99)
Potassium: 3.8 mmol/L (ref 3.5–5.1)
Sodium: 140 mmol/L (ref 135–145)
Total Bilirubin: 0.8 mg/dL (ref 0.3–1.2)
Total Protein: 6.4 g/dL — ABNORMAL LOW (ref 6.5–8.1)

## 2018-09-29 LAB — CBC
HCT: 31.8 % — ABNORMAL LOW (ref 36.0–46.0)
Hemoglobin: 10.7 g/dL — ABNORMAL LOW (ref 12.0–15.0)
MCH: 29.4 pg (ref 26.0–34.0)
MCHC: 33.6 g/dL (ref 30.0–36.0)
MCV: 87.4 fL (ref 80.0–100.0)
Platelets: 459 10*3/uL — ABNORMAL HIGH (ref 150–400)
RBC: 3.64 MIL/uL — ABNORMAL LOW (ref 3.87–5.11)
RDW: 13.5 % (ref 11.5–15.5)
WBC: 10.6 10*3/uL — ABNORMAL HIGH (ref 4.0–10.5)
nRBC: 0 % (ref 0.0–0.2)

## 2018-09-29 LAB — TYPE AND SCREEN
ABO/RH(D): A POS
Antibody Screen: NEGATIVE
Unit division: 0
Unit division: 0

## 2018-09-29 LAB — MAGNESIUM: Magnesium: 2.2 mg/dL (ref 1.7–2.4)

## 2018-09-29 LAB — PROTIME-INR
INR: 1.17
Prothrombin Time: 14.8 seconds (ref 11.4–15.2)

## 2018-09-29 LAB — BPAM RBC
Blood Product Expiration Date: 202002222359
Blood Product Expiration Date: 202002222359
ISSUE DATE / TIME: 202002112004
ISSUE DATE / TIME: 202002112310
Unit Type and Rh: 6200
Unit Type and Rh: 6200

## 2018-09-29 MED ORDER — PEG 3350-KCL-NABCB-NACL-NASULF 236 G PO SOLR
4000.0000 mL | Freq: Once | ORAL | Status: DC
  Filled 2018-09-29: qty 4000

## 2018-09-29 MED ORDER — PEG 3350-KCL-NABCB-NACL-NASULF 236 G PO SOLR
4000.0000 mL | Freq: Once | ORAL | Status: AC
  Administered 2018-09-29: 4000 mL via ORAL
  Filled 2018-09-29: qty 4000

## 2018-09-29 MED ORDER — PEG-KCL-NACL-NASULF-NA ASC-C 100 G PO SOLR: 1.0000 | Freq: Once | ORAL | Status: DC

## 2018-09-29 MED ORDER — PANTOPRAZOLE SODIUM 40 MG IV SOLR
40.0000 mg | Freq: Two times a day (BID) | INTRAVENOUS | Status: DC
  Administered 2018-09-29 – 2018-09-30 (×3): 40 mg via INTRAVENOUS
  Filled 2018-09-29 (×3): qty 40

## 2018-09-29 NOTE — Consult Note (Addendum)
Consultation  Referring Provider: Dr. Sloan Leiter    Primary Care Physician:  Billie Ruddy, MD Primary Gastroenterologist: Althia Forts      Reason for Consultation: Acute on chronic anemia          HPI:   [name] [name] is a 63 y.o. female with a past medical history as listed below including PE DVT and recent MI with placement of drug-eluting stents times 21/20/20 Xarelto, Plavix and aspirin, who was admitted to [facility] 09/28/2018 with acute on chronic anemia with a complaint of shortness of breath.    Per previous physician's notes patient was admitted 09/06/2018 with an MI and underwent stent placement, started on dual antiplatelet therapy with Plavix and aspirin.  In addition to this patient is chronically anticoagulated with Xarelto given history of lupus anticoagulant syndrome resulting in pulmonary embolism as well as DVT 6 years ago.    Today, the patient explains that she came to the hospital yesterday with 2 to 3 days of shortness of breath with exertion.  Other than that she had been feeling fairly well.  Tells me she has been having normal bowel movements at home and has not seen any bright red blood or "darker than normal" bowel movements.  Tells me she is chronically on slow release iron so her stools are always dark but they have been the same recently.  No abdominal pain, heartburn or reflux.    Social history positive for being a vegetarian.    Denies fever, chills, anorexia, nausea, vomiting or weight loss.  ED course: Heart rate 70-84, blood pressure 124/64-130/59, labs with a BUN of 17 compared to 8 on 09/15/2018, creatinine 0.76 compared to 0.67 on 09/15/2018, hemoglobin 8.7 entheses 11.1 09/15/2018), MCV 92.4, fecal occult positive, given transfusion of 2 units PRBCs  Previous GI history: 05/19/2012 colonoscopy, Dr. Benson Norway at [facility]: Anterior anal fissure 05/19/2012 EGD Dr. Benson Norway: normal  Past Medical History:  Diagnosis Date  . Back pain   . DVT (deep  venous thrombosis) ([place])   . Hypertension   . Lupus anticoagulant syndrome ([place])   . Pulmonary embolism [facility])     Past Surgical History:  Procedure Laterality Date  . BACK SURGERY    . COLONOSCOPY  05/19/2012   Procedure: COLONOSCOPY;  Surgeon: Beryle Beams, MD;  Location: [place];  Service: Endoscopy;  Laterality: N/A;  . CORONARY STENT INTERVENTION N/A 09/14/2018   Procedure: CORONARY STENT INTERVENTION;  Surgeon: Martinique, Peter M, MD;  Location: [place] CV LAB;  Service: Cardiovascular;  Laterality: N/A;  . ESOPHAGOGASTRODUODENOSCOPY  05/19/2012   Procedure: ESOPHAGOGASTRODUODENOSCOPY (EGD);  Surgeon: Beryle Beams, MD;  Location: [facility] ENDOSCOPY;  Service: Endoscopy;  Laterality: N/A;  . INTRAVASCULAR PRESSURE WIRE/FFR STUDY N/A 09/14/2018   Procedure: INTRAVASCULAR PRESSURE WIRE/FFR STUDY;  Surgeon: Martinique, Peter M, MD;  Location: [place] CV LAB;  Service: Cardiovascular;  Laterality: N/A;  . LEFT HEART CATH AND CORONARY ANGIOGRAPHY N/A 09/13/2018   Procedure: LEFT HEART CATH AND CORONARY ANGIOGRAPHY;  Surgeon: Leonie Man, MD;  Location: [place] CV LAB;  Service: Cardiovascular;  Laterality: N/A;    Family History  Problem Relation Age of Onset  . Leukemia Mother   . Prostate cancer Father   . Cancer Father     Social History   Tobacco Use  . Smoking status: Current Every Day Smoker    Packs/day: 0.50    Years: 29.00    Pack years: 14.50    Types:  Cigarettes  . Smokeless tobacco: Never Used  Substance Use Topics  . Alcohol use: Yes    Comment: Occasional  . Drug use: No    Prior to Admission medications   Medication Sig Start Date End Date Taking? Authorizing Provider  acetaminophen (TYLENOL) 500 MG tablet Take 1,000 mg by mouth every 6 (six) hours as needed for mild pain.    Yes [provider]  aspirin EC 81 MG EC tablet Take 1 tablet (81 mg total) by mouth daily for 30 days. 09/15/18 10/15/18 Yes Elodia Florence., MD  bisacodyl  (DULCOLAX) 5 MG EC tablet Take 5 mg by mouth daily as needed for moderate constipation.   Yes [provider]  carvedilol (COREG) 6.25 MG tablet Take 1 tablet (6.25 mg total) by mouth 2 (two) times daily with a meal for 30 days. 09/24/18 10/24/18 Yes Kilroy, Doreene Burke, PA-C  clopidogrel (PLAVIX) 75 MG tablet Take 1 tablet (75 mg total) by mouth daily. 09/24/18 11/23/18 Yes Kilroy, Doreene Burke, PA-C  Ferrous Sulfate (SLOW FE PO) Take 1 tablet by mouth daily.   Yes [provider]  lisinopril (PRINIVIL,ZESTRIL) 40 MG tablet Take 1 tablet (40 mg total) by mouth daily. 09/08/18  Yes Billie Ruddy, MD  nitroGLYCERIN (NITROSTAT) 0.4 MG SL tablet Place 1 tablet (0.4 mg total) under the tongue every 5 (five) minutes as needed for up to 3 doses for chest pain. 09/15/18  Yes Elodia Florence., MD  rivaroxaban (XARELTO) 20 MG TABS tablet Take 1 tablet (20 mg total) by mouth daily. 11/26/17  Yes Hawks, Christy A, FNP  rosuvastatin (CRESTOR) 10 MG tablet Take 1 tablet (10 mg total) by mouth daily at 6 PM for 30 days. 09/24/18 10/24/18 Yes Kilroy, Doreene Burke, PA-C    Current Facility-Administered Medications  Medication Dose Route Frequency Provider Last Rate Last Dose  . 0.9 %  sodium chloride infusion (Manually program via Guardrails IV Fluids)   Intravenous Once Howerter, Justin B, DO      . aspirin EC tablet 81 mg  81 mg Oral Daily Howerter, Justin B, DO   81 mg at 09/29/18 0900  . clopidogrel (PLAVIX) tablet 75 mg  75 mg Oral Daily Howerter, Justin B, DO   75 mg at 09/29/18 0900  . fentaNYL (SUBLIMAZE) injection 25 mcg  25 mcg Intravenous Q2H PRN Howerter, Justin B, DO      . nicotine (NICODERM CQ - dosed in mg/24 hours) patch 14 mg  14 mg Transdermal Daily PRN Howerter, Justin B, DO      . ondansetron (ZOFRAN) tablet 4 mg  4 mg Oral Q6H PRN Howerter, Justin B, DO       Or  . ondansetron (ZOFRAN) injection 4 mg  4 mg Intravenous Q6H PRN Howerter, Justin B, DO      . rosuvastatin (CRESTOR) tablet 10 mg  10  mg Oral q1800 Howerter, Justin B, DO      . sodium chloride flush (NS) 0.9 % injection 3 mL  3 mL Intravenous Once Howerter, Justin B, DO        Allergies as of 09/28/2018 - Review Complete 09/28/2018  Allergen Reaction Noted  . Prednisone Other (See Comments) 04/20/2012  . Tramadol Other (See Comments) 04/20/2012     Review of Systems:    Constitutional: No weight loss, fever or chills Skin: No rash  Cardiovascular: No chest pain Respiratory: No SOB Gastrointestinal: See HPI and otherwise negative Genitourinary: No dysuria  Neurological: No  headache, dizziness or syncope Musculoskeletal: No new muscle or joint pain Hematologic: No bleeding  Psychiatric: No history of depression or anxiety    Physical Exam:  Vital signs in last 24 hours: Temp:  [98 F (36.7 C)-98.8 F (37.1 C)] 98.3 F (36.8 C) (02/12 0505) Pulse Rate:  [70-93] 89 (02/12 0505) Resp:  [11-20] 16 (02/12 0505) BP: (105-138)/(50-91) 123/61 (02/12 0505) SpO2:  [98 %-100 %] 99 % (02/12 0505) Weight:  [68.8 kg] 68.8 kg (02/11 2346) Last BM Date: (PTA) General:   Pleasant Caucasian female appears to be in NAD, Well developed, Well nourished, alert and cooperative Head:  Normocephalic and atraumatic. Eyes:   PEERL, EOMI. No icterus. Conjunctiva pink. Ears:  Normal auditory acuity. Neck:  Supple Throat: Oral cavity and pharynx without inflammation, swelling or lesion. Lungs: Respirations even and unlabored. Lungs clear to auscultation bilaterally.   No wheezes, crackles, or rhonchi.  Heart: Normal S1, S2. No MRG. Regular rate and rhythm. No peripheral edema, cyanosis or pallor.  Abdomen:  Soft, nondistended, nontender. No rebound or guarding. Normal bowel sounds. No appreciable masses or hepatomegaly. Rectal:  Not performed.  Msk:  Symmetrical without gross deformities. Peripheral pulses intact.  Extremities:  Without edema, no deformity or joint abnormality. Neurologic:  Alert and  oriented x4;  grossly normal  neurologically.  Skin:   Dry and intact without significant lesions or rashes. Psychiatric: Demonstrates good judgement and reason without abnormal affect or behaviors.   LAB RESULTS: Recent Labs    09/28/18 1417 09/29/18 0337  WBC 13.0* 10.6*  HGB 8.7* 10.7*  HCT 28.1* 31.8*  PLT 590* 459*   BMET Recent Labs    09/28/18 1417 09/29/18 0337  NA 137 140  K 4.2 3.8  CL 105 107  CO2 22 24  GLUCOSE 112* 90  BUN 17 13  CREATININE 0.76 0.77  CALCIUM 9.4 8.9   LFT Recent Labs    09/29/18 0337  PROT 6.4*  ALBUMIN 3.2*  AST 13*  ALT 13  ALKPHOS 77  BILITOT 0.8   PT/INR Recent Labs    09/29/18 0337  LABPROT 14.8  INR 1.17    STUDIES: Dg Chest 2 View  Result Date: 09/28/2018 CLINICAL DATA:  Patient had a myocardial infarction on January 23rd and had stent placed 2 weeks ago. Patient began having left arm pit pain and dyspnea today. EXAM: CHEST - 2 VIEW COMPARISON:  2620 FINDINGS: The heart size and mediastinal contours are within normal limits. Mild ectasia of the thoracic aorta without aneurysm. Both lungs are clear. The visualized skeletal structures are unremarkable. Bone island projects over the right shoulder versus an intra-articular loose body, unchanged in appearance. IMPRESSION: No active cardiopulmonary disease. Electronically Signed   By: Ashley Royalty M.D.   On: 09/28/2018 14:57    Impression / Plan:   Impression: 1.  Acute on chronic anemia: Baseline hemoglobin 11-12, presented with shortness of breath, hemoglobin 8.7, fecal occult positive, received 2 units PRBCs hemoglobin now 10.7, last EGD and colon in 2013 significant only for an anal fissure; Consider GI source of blood loss likely exacerbated by recent initiation of dual antiplatelet therapy after her stents placed on top of Xarelto 2.  CAD: Currently on aspirin and Plavix given recent stent placement 3 weeks ago 3.  Lupus anticoagulant syndrome: Chronically on Xarelto for history of PE and DVT, this  has been held  Plan: 1.  Discussed with patient that she will likely need EGD and colonoscopy at some time.  Currently she is still on blood thinners.  Will need to discuss timing with Dr. Havery Moros.  Did discuss risks, benefits, limitations and alternatives and the patient agrees to proceed when necessary. 2.  Will adjust diet today given that we will not be doing procedures today. 3.  Please await further recommendations from Dr. Havery Moros.  Thank you for your kind consultation, we will continue to follow.  Lavone Nian [facility]  09/29/2018, 11:41 AM  Addendum: Scheduled for EGD/Colonoscopy tomorrow at 11:00am with Dr. Havery Moros. Patient can be on clears today and will start prep this afternoon, NPO after midnight. Will contact cardiology to have them ok patient for procedures tomorrow given recent stent and anticoagulation.  Ellouise Newer, PA-C

## 2018-09-29 NOTE — Progress Notes (Signed)
Brief note regarding plan, with full H&P to follow:  [name] [name] is a 63 y.o. female with medical history significant for coronary artery disease status post recent placement of drug-eluting stents x2, lupus anticoagulant syndrome chronically anticoagulated on Xarelto, chronic iron deficiency anemia, who was admitted to [facility] on 09/28/2018 with acute on chronic anemia after presenting from home to the [facility] emergency department complaining of shortness of breath.    #) Acute on chronic anemia: In the setting of chronic iron deficiency anemia with baseline hemoglobin of 11-12 on chronic iron supplementation as an outpatient, the patient presents with 2 to 3 days of shortness of breath and laboratory findings revealing hemoglobin of 8.7 relative to most recent prior data point of 11.1 on 09/15/2018.  Suspect that this is on the basis of a slow lower GI bleed given fecal occult blood positive finding in the emergency department this evening in the setting of recent initiation of dual antiplatelet therapy given placement of drug-eluting stents x2 on 09/14/2018.  This is in addition to chronic anticoagulation on Xarelto in the setting of lupus anticoagulant syndrome and an associated history of pulmonary embolism as well as DVT in 2014.  Presentation has not been associated with any tachycardia, although does acknowledge the patient is on a beta-blocker at home.  No evidence of associated hypotension.  Given that the patient's stents were drug-eluting in nature, increasing the possibility of early in-stent stenosis, will continue dual antiplatelet therapy for now, which was intended to last for a total of 1 month following placement of drug-eluting stents.  Patient conveys that her shortness of breath has resolved after receiving a little over 1 unit PRBC.   Plan: We will finish transfusing second unit PRBC, with plan to repeat hemoglobin following completion of the second unit.  N.p.o.   Will consider discussing case with gastroenterology for input regarding necessity of colonoscopy.  We will continue outpatient dual antiplatelet therapy for now, as further described above.  But, will hold home Xarelto for now.  Repeat CBC in the morning.  Will refrain from additional pharmacologic anticoagulation.  SCDs.  Monitor on telemetry.  Check INR.    Babs Bertin, DO Hospitalist

## 2018-09-29 NOTE — Progress Notes (Signed)
Spoke with pharmacist concerning 2nd dose of GoLYTELY. Pharmacist stated that 2nd dose in not necessary and will be d/c'd. Will notify pt and continue to monitor.

## 2018-09-29 NOTE — Progress Notes (Signed)
PROGRESS NOTE        PATIENT DETAILS Name: [name] [name] Age: 63 y.o. Sex: female Date of Birth: [birth year] Admit Date: 09/28/2018 Admitting Physician Rhetta Mura, DO WUJ:[id number], Langley Adie, MD  Brief Narrative: Patient is a 63 y.o. female with history of recurrent VTE due to lupus anticoagulant-on Xarelto, recent non-STEMI requiring LHC and PCI to circumflex and LAD with DES-on aspirin/Plavix (in addition to Xarelto) presenting with exertional dyspnea, found to have anemia.  See below for further details  Subjective: Feels better-wanting to go home.  Has black stools-but apparently is also on iron.  Assessment/Plan: Probable recent upper GI bleeding with subacute blood loss anemia: On aspirin/Plavix and Xarelto due to history of recent PCI to LCx/LAD with DES and recurrent VTE due to lupus anticoagulant.  Hemoglobin stable at 10.7 after 2 units of PRBC transfusion on admission.  GI consulted-suspect requires endoscopic evaluation.  Due to stability of GI bleeding-and recent PCI-has been cautiously continued on aspirin and Plavix.  Await further recommendations from GI.  Start Protonix IV twice daily for now.  CAD with PCI to mid LCx and proximal LAD with DES on 09/14/2018: No anginal symptoms-given stability of GI bleeding-has been cautiously continued on aspirin and Plavix.  Have consulted cardiology earlier this morning.  Recurrent venous thromboembolism due to lupus anticoagulant: Patient has had prior PE and DVT-maintained on Xarelto-which is currently held at this point.  Await GI evaluation.  Hypertension: Blood pressure stable-resume lisinopril and Coreg when able.  DVT Prophylaxis: SCD's  Code Status: Full code   Family Communication: None at bedside  Disposition Plan: Remain inpatient  Antimicrobial agents: Anti-infectives (From admission, onward)   None      Procedures: None  CONSULTS:  cardiology and GI  Time spent: 25-  minutes-Greater than 50% of this time was spent in counseling, explanation of diagnosis, planning of further management, and coordination of care.  MEDICATIONS: Scheduled Meds: . sodium chloride   Intravenous Once  . aspirin EC  81 mg Oral Daily  . clopidogrel  75 mg Oral Daily  . rosuvastatin  10 mg Oral q1800  . sodium chloride flush  3 mL Intravenous Once   Continuous Infusions: PRN Meds:.fentaNYL (SUBLIMAZE) injection, nicotine, ondansetron **OR** ondansetron (ZOFRAN) IV   PHYSICAL EXAM: Vital signs: Vitals:   09/28/18 2337 09/28/18 2346 09/29/18 0215 09/29/18 0505  BP: (!) 119/59  129/71 123/61  Pulse: 80  80 89  Resp: 14  17 16   Temp: 98.3 F (36.8 C)  98.5 F (36.9 C) 98.3 F (36.8 C)  TempSrc: Oral  Oral Oral  SpO2: 98%  100% 99%  Weight:  68.8 kg    Height:  5' 4.5" (1.638 m)     Filed Weights   09/28/18 2346  Weight: 68.8 kg   Body mass index is 25.63 kg/m.   General appearance :Awake, alert, not in any distress.  HEENT: Atraumatic and Normocephalic Neck: supple, no JVD. No cervical lymphadenopathy. No thyromegaly Resp:Good air entry bilaterally, no added sounds  CVS: S1 S2 regular, no murmurs.  GI: Bowel sounds present, Non tender and not distended with no gaurding, rigidity or rebound.No organomegaly Extremities: B/L Lower Ext shows no edema, both legs are warm to touch Neurology:  speech clear,Non focal, sensation is grossly intact. Psychiatric: Normal judgment and insight. Alert and oriented x 3. Normal mood. Musculoskeletal:No digital  cyanosis Skin:No Rash, warm and dry Wounds:N/A  I have personally reviewed following labs and imaging studies  LABORATORY DATA: CBC: Recent Labs  Lab 09/28/18 1417 09/29/18 0337  WBC 13.0* 10.6*  HGB 8.7* 10.7*  HCT 28.1* 31.8*  MCV 92.4 87.4  PLT 590* 459*    Basic Metabolic Panel: Recent Labs  Lab 09/28/18 1417 09/29/18 0337  NA 137 140  K 4.2 3.8  CL 105 107  CO2 22 24  GLUCOSE 112* 90  BUN 17  13  CREATININE 0.76 0.77  CALCIUM 9.4 8.9  MG  --  2.2    GFR: Estimated Creatinine Clearance: 69.4 mL/min (by C-G formula based on SCr of 0.77 mg/dL).  Liver Function Tests: Recent Labs  Lab 09/29/18 0337  AST 13*  ALT 13  ALKPHOS 77  BILITOT 0.8  PROT 6.4*  ALBUMIN 3.2*   No results for input(s): LIPASE, AMYLASE in the last 168 hours. No results for input(s): AMMONIA in the last 168 hours.  Coagulation Profile: Recent Labs  Lab 09/29/18 0337  INR 1.17    Cardiac Enzymes: No results for input(s): CKTOTAL, CKMB, CKMBINDEX, TROPONINI in the last 168 hours.  BNP (last 3 results) No results for input(s): PROBNP in the last 8760 hours.  HbA1C: No results for input(s): HGBA1C in the last 72 hours.  CBG: No results for input(s): GLUCAP in the last 168 hours.  Lipid Profile: No results for input(s): CHOL, HDL, LDLCALC, TRIG, CHOLHDL, LDLDIRECT in the last 72 hours.  Thyroid Function Tests: No results for input(s): TSH, T4TOTAL, FREET4, T3FREE, THYROIDAB in the last 72 hours.  Anemia Panel: No results for input(s): VITAMINB12, FOLATE, FERRITIN, TIBC, IRON, RETICCTPCT in the last 72 hours.  Urine analysis:    Component Value Date/Time   COLORURINE YELLOW 09/13/2018 1418   APPEARANCEUR CLEAR 09/13/2018 1418   APPEARANCEUR Clear 12/03/2015 1122   LABSPEC 1.019 09/13/2018 1418   PHURINE 5.0 09/13/2018 1418   GLUCOSEU NEGATIVE 09/13/2018 1418   HGBUR MODERATE (A) 09/13/2018 1418   BILIRUBINUR NEGATIVE 09/13/2018 1418   BILIRUBINUR Negative 12/03/2015 1122   KETONESUR NEGATIVE 09/13/2018 1418   PROTEINUR NEGATIVE 09/13/2018 1418   UROBILINOGEN negative 06/21/2015 1654   UROBILINOGEN 0.2 05/22/2012 0254   NITRITE NEGATIVE 09/13/2018 1418   LEUKOCYTESUR NEGATIVE 09/13/2018 1418   LEUKOCYTESUR Trace (A) 12/03/2015 1122    Sepsis Labs: Lactic Acid, Venous    Component Value Date/Time   LATICACIDVEN 0.9 05/18/2012 1451    MICROBIOLOGY: No results found for  this or any previous visit (from the past 240 hour(s)).  RADIOLOGY STUDIES/RESULTS: Dg Chest 2 View  Result Date: 09/28/2018 CLINICAL DATA:  Patient had a myocardial infarction on January 23rd and had stent placed 2 weeks ago. Patient began having left arm pit pain and dyspnea today. EXAM: CHEST - 2 VIEW COMPARISON:  2620 FINDINGS: The heart size and mediastinal contours are within normal limits. Mild ectasia of the thoracic aorta without aneurysm. Both lungs are clear. The visualized skeletal structures are unremarkable. Bone island projects over the right shoulder versus an intra-articular loose body, unchanged in appearance. IMPRESSION: No active cardiopulmonary disease. Electronically Signed   By: Ashley Royalty M.D.   On: 09/28/2018 14:57   Dg Chest 2 View  Result Date: 09/12/2018 CLINICAL DATA:  Chest pain for several days EXAM: CHEST - 2 VIEW COMPARISON:  09/10/18 FINDINGS: Cardiac shadow is stable. Aortic calcifications are again seen. The lungs are clear bilaterally. No acute bony abnormality is noted. IMPRESSION: No acute  abnormality noted.  No change from the prior exam. Electronically Signed   By: Inez Catalina M.D.   On: 09/12/2018 16:50     LOS: 1 day   Oren Binet, MD  [facility]  If 7PM-7AM, please contact night-coverage  Please page via [email or web address]  Go to [email or web address] and use [place]'s universal password to access. If you do not have the password, please contact the hospital operator.  Locate the [facility] provider you are looking for under [facility] and page to a number that you can be directly reached. If you still have difficulty reaching the provider, please page the [facility] (Director on Call) for the Hospitalists listed on amion for assistance.  09/29/2018, 11:57 AM

## 2018-09-29 NOTE — Consult Note (Addendum)
CARDIOLOGY CONSULT NOTE    Patient ID: [name] [name]; [id number]; May 08, 1956   Admit date: 09/28/2018 Date of Consult: 09/29/2018  Primary Care Provider: Billie Ruddy, MD Primary Cardiologist: Dr Ellyn Hack Primary Electrophysiologist:  None   Patient Profile:   [name] [name] is a 63 y.o. female with a hx of CAD status post DES mid circ and LAD, lupus anticoagulant syndrome, and chronic iron deficiency anemia who is being seen today at the request of G.I. for evaluation of the patient's dual antiplatelet therapy.  History of Present Illness:   [name] [name] was recently admitted in late January for STEMI. During that time she underwent PCI with placement of two eluting stents, one to the mid circ and another to the mid LAD. She was started on Plavix and aspirin and discharged in stable condition. The patient was doing well until this past Sunday when she developed shortness of breath and fatigue. She initially thought she was just over exerting herself after returning to work however over the next 1 to 2 days she felt that her fatigue got progressively worse. This prompted her to come to the emergency department for further evaluation. She denies any chest pressure, palpitations, diaphoresis, dizziness, pre-/syncope, orthopnea, PND, peripheral edema.   She does not use NSAIDs. She is a smoker. She is an occasional consumer of alcohol. She did have a colonoscopy and endoscopy approximately six years ago when she was diagnosed with lower extremity DVTs. Colonoscopy showed some internal hemorrhoids and an anal fissure but the endoscopy was unrevealing. Since that time she has been taking Xarelto for antiphospholipid syndrome.  Past Medical History:  Diagnosis Date  . Back pain   . DVT (deep venous thrombosis) ([place])   . Hypertension   . Lupus anticoagulant syndrome ([place])   . Pulmonary embolism [facility])    Past Surgical History:  Procedure Laterality Date  . BACK SURGERY    .  COLONOSCOPY  05/19/2012   Procedure: COLONOSCOPY;  Surgeon: Beryle Beams, MD;  Location: [place];  Service: Endoscopy;  Laterality: N/A;  . CORONARY STENT INTERVENTION N/A 09/14/2018   Procedure: CORONARY STENT INTERVENTION;  Surgeon: Martinique, Peter M, MD;  Location: [place] CV LAB;  Service: Cardiovascular;  Laterality: N/A;  . ESOPHAGOGASTRODUODENOSCOPY  05/19/2012   Procedure: ESOPHAGOGASTRODUODENOSCOPY (EGD);  Surgeon: Beryle Beams, MD;  Location: [facility] ENDOSCOPY;  Service: Endoscopy;  Laterality: N/A;  . INTRAVASCULAR PRESSURE WIRE/FFR STUDY N/A 09/14/2018   Procedure: INTRAVASCULAR PRESSURE WIRE/FFR STUDY;  Surgeon: Martinique, Peter M, MD;  Location: [place] CV LAB;  Service: Cardiovascular;  Laterality: N/A;  . LEFT HEART CATH AND CORONARY ANGIOGRAPHY N/A 09/13/2018   Procedure: LEFT HEART CATH AND CORONARY ANGIOGRAPHY;  Surgeon: Leonie Man, MD;  Location: [place] CV LAB;  Service: Cardiovascular;  Laterality: N/A;    Home Medications:  Prior to Admission medications   Medication Sig Start Date End Date Taking? Authorizing Provider  acetaminophen (TYLENOL) 500 MG tablet Take 1,000 mg by mouth every 6 (six) hours as needed for mild pain.    Yes [provider]  aspirin EC 81 MG EC tablet Take 1 tablet (81 mg total) by mouth daily for 30 days. 09/15/18 10/15/18 Yes Elodia Florence., MD  bisacodyl (DULCOLAX) 5 MG EC tablet Take 5 mg by mouth daily as needed for moderate constipation.   Yes [provider]  carvedilol (COREG) 6.25 MG tablet Take 1 tablet (6.25 mg total) by mouth 2 (two) times daily with  a meal for 30 days. 09/24/18 10/24/18 Yes Kilroy, Doreene Burke, PA-C  clopidogrel (PLAVIX) 75 MG tablet Take 1 tablet (75 mg total) by mouth daily. 09/24/18 11/23/18 Yes Kilroy, Doreene Burke, PA-C  Ferrous Sulfate (SLOW FE PO) Take 1 tablet by mouth daily.   Yes [provider]  lisinopril (PRINIVIL,ZESTRIL) 40 MG tablet Take 1 tablet (40 mg total) by mouth daily.  09/08/18  Yes Billie Ruddy, MD  nitroGLYCERIN (NITROSTAT) 0.4 MG SL tablet Place 1 tablet (0.4 mg total) under the tongue every 5 (five) minutes as needed for up to 3 doses for chest pain. 09/15/18  Yes Elodia Florence., MD  rivaroxaban (XARELTO) 20 MG TABS tablet Take 1 tablet (20 mg total) by mouth daily. 11/26/17  Yes Hawks, Christy A, FNP  rosuvastatin (CRESTOR) 10 MG tablet Take 1 tablet (10 mg total) by mouth daily at 6 PM for 30 days. 09/24/18 10/24/18 Yes Erlene Quan, PA-C   Inpatient Medications: Scheduled Meds: . sodium chloride   Intravenous Once  . aspirin EC  81 mg Oral Daily  . clopidogrel  75 mg Oral Daily  . pantoprazole (PROTONIX) IV  40 mg Intravenous Q12H  . polyethylene glycol  4,000 mL Oral Once   And  . polyethylene glycol  4,000 mL Oral Once  . rosuvastatin  10 mg Oral q1800  . sodium chloride flush  3 mL Intravenous Once   Continuous Infusions:  PRN Meds: fentaNYL (SUBLIMAZE) injection, nicotine, ondansetron **OR** ondansetron (ZOFRAN) IV  Allergies:    Allergies  Allergen Reactions  . Prednisone Other (See Comments)    No energy "stalls out".    Makes pt  Jittery.  . Tramadol Other (See Comments)    Constipation,  Makes pt  Jittery.   Social History:   Social History   Socioeconomic History  . Marital status: Married    Spouse name: Not on file  . Number of children: Not on file  . Years of education: Not on file  . Highest education level: Not on file  Occupational History  . Not on file  Social Needs  . Financial resource strain: Not on file  . Food insecurity:    Worry: Not on file    Inability: Not on file  . Transportation needs:    Medical: Not on file    Non-medical: Not on file  Tobacco Use  . Smoking status: Current Every Day Smoker    Packs/day: 0.50    Years: 29.00    Pack years: 14.50    Types: Cigarettes  . Smokeless tobacco: Never Used  Substance and Sexual Activity  . Alcohol use: Yes    Comment: Occasional  .  Drug use: No  . Sexual activity: Not on file  Lifestyle  . Physical activity:    Days per week: Not on file    Minutes per session: Not on file  . Stress: Not on file  Relationships  . Social connections:    Talks on phone: Not on file    Gets together: Not on file    Attends religious service: Not on file    Active member of club or organization: Not on file    Attends meetings of clubs or organizations: Not on file    Relationship status: Not on file  . Intimate partner violence:    Fear of current or ex partner: Not on file    Emotionally abused: Not on file    Physically abused: Not on file  Forced sexual activity: Not on file  Other Topics Concern  . Not on file  Social History Narrative  . Not on file    Family History:   Family History  Problem Relation Age of Onset  . Leukemia Mother   . Prostate cancer Father   . Cancer Father     ROS:  Performed and all others negative.  Physical Exam/Data:   Vitals:   09/28/18 2346 09/29/18 0215 09/29/18 0505 09/29/18 1431  BP:  129/71 123/61 (!) 142/62  Pulse:  80 89 67  Resp:  17 16 16   Temp:  98.5 F (36.9 C) 98.3 F (36.8 C) 98.8 F (37.1 C)  TempSrc:  Oral Oral Oral  SpO2:  100% 99% 100%  Weight: 68.8 kg     Height: 5' 4.5" (1.638 m)       Intake/Output Summary (Last 24 hours) at 09/29/2018 1507 Last data filed at 09/29/2018 0215 Gross per 24 hour  Intake 595 ml  Output -  Net 595 ml   Filed Weights   09/28/18 2346  Weight: 68.8 kg   Body mass index is 25.63 kg/m.   General:  Well nourished, well developed, in no acute distress HEENT: normal Lymph: no adenopathy Neck: no JVD Endocrine:  No thryomegaly Vascular: No carotid bruits; FA pulses 2+ bilaterally without bruits  Cardiac:  normal S1, S2; RRR; no murmur  Lungs:  clear to auscultation bilaterally, no wheezing, rhonchi or rales  Abd: soft, nontender, no hepatomegaly  Ext: no edema Musculoskeletal:  No deformities, BUE and BLE strength  normal and equal Skin: warm and dry  Neuro:  CNs 2-12 intact, no focal abnormalities noted Psych:  Normal affect   EKG:  The EKG was personally reviewed and demonstrates: Sinus rhythm with normal axis and intervals. She does have T-wave immersion in the inferior-lateral leads. Stable compared to 09/24/2018 EKG.   Telemetry:  Telemetry was personally reviewed and demonstrates: NSR  Relevant CV Studies:  Cath 09/14/2018  Prox LAD-1 lesion is 35% stenosed with 20% stenosed side branch in Ost 1st Diag.  Prox LAD-2 lesion is 80% stenosed with 90% stenosed side branch in Ost 1st Sept.  A drug-eluting stent was successfully placed using a STENT RESOLUTE ONYX 3.0X22.  Post intervention, there is a 0% residual stenosis.  Prox Cx to Mid Cx lesion is 99% stenosed.  Mid Cx lesion is 55% stenosed.  A drug-eluting stent was successfully placed using a STENT RESOLUTE ONYX 3.5X30.  Post intervention, there is a 0% residual stenosis.  Prox RCA lesion is 70% stenosed. DFR was 0.99   1. Successful PCI of the mid LCx with DES x 1 2. Successful PCI of the proximal to mid LAD with DES x 1. 3. Proximal RCA lesion with normal DFR of 0.99  Plan: ASA 81 mg daily for one month. Plavix 75 mg daily for one year. Plan resuming Xarelto 20 mg daily tomorrow. Anticipate DC tomorrow.   Laboratory Data:  Chemistry Recent Labs  Lab 09/28/18 1417 09/29/18 0337  NA 137 140  K 4.2 3.8  CL 105 107  CO2 22 24  GLUCOSE 112* 90  BUN 17 13  CREATININE 0.76 0.77  CALCIUM 9.4 8.9  GFRNONAA >60 >60  GFRAA >60 >60  ANIONGAP 10 9    Recent Labs  Lab 09/29/18 0337  PROT 6.4*  ALBUMIN 3.2*  AST 13*  ALT 13  ALKPHOS 77  BILITOT 0.8   Hematology Recent Labs  Lab 09/28/18 1417 09/29/18 3810  WBC 13.0* 10.6*  RBC 3.04* 3.64*  HGB 8.7* 10.7*  HCT 28.1* 31.8*  MCV 92.4 87.4  MCH 28.6 29.4  MCHC 31.0 33.6  RDW 12.9 13.5  PLT 590* 459*   Cardiac EnzymesNo results for input(s): TROPONINI in the  last 168 hours.  Recent Labs  Lab 09/28/18 1452  TROPIPOC 0.03    BNPNo results for input(s): BNP, PROBNP in the last 168 hours.  DDimer No results for input(s): DDIMER in the last 168 hours.  Radiology/Studies:  Dg Chest 2 View  Result Date: 09/28/2018 CLINICAL DATA:  Patient had a myocardial infarction on January 23rd and had stent placed 2 weeks ago. Patient began having left arm pit pain and dyspnea today. EXAM: CHEST - 2 VIEW COMPARISON:  2620 FINDINGS: The heart size and mediastinal contours are within normal limits. Mild ectasia of the thoracic aorta without aneurysm. Both lungs are clear. The visualized skeletal structures are unremarkable. Bone island projects over the right shoulder versus an intra-articular loose body, unchanged in appearance. IMPRESSION: No active cardiopulmonary disease. Electronically Signed   By: Ashley Royalty M.D.   On: 09/28/2018 14:57   Assessment and Plan:   [name] [name] is a 63 y.o. female with a hx of CAD status post DES mid circ and LAD, lupus anticoagulant syndrome, and chronic iron deficiency anemia who was admitted to the hospital with acute on chronic anemia felt to be secondary to an upper G.I. bleed.  CAD s/p 2 DES (Cirx and LAD) Hypertension  - Stents placed on 09/14/2018 - Okay to discontinue ASA but would recommend continuing Plavix as the risk for in stent thrombosis is high. - Would recommend restarting the patient's carvedilol and lisinopril once medically stable.  Presumed upper G.I. bleed Acute on Chronic Anemia - S/p two units packed red cells. Hemoglobin up to 10.7 from 8.7 on admission - GI planning colonoscopy and endoscopy on 09/30/2018 - Holding Xarelto for patient's lupus anticoagulant syndrome  - Started on Protonix. - Per GI and Primary   For questions or updates, please contact [place] Please consult [email or web address] for contact info under Cardiology/STEMI.   Signed, Ina Homes, MD 09/29/2018 3:07 PM   I  have examined the patient and reviewed assessment and plan and discussed with patient.  Agree with above as stated.    Stable from cardiac standpoint.  Given GI bleeding, would stop aspirin.  Continue Plavix.  Based on results of endoscopy, could decide on restarting Xarelto.  Given the type of stent that she had, she does need to stay on Plavix for at least 3 months without interruption.  Ideally, she would stay on Plavix without interruption for 12 months, but 3 months would be a bare minimum if she has recurrent, intractable bleeding.  We will follow.  Larae Grooms

## 2018-09-29 NOTE — H&P (View-Only) (Signed)
Consultation  Referring Provider: Dr. Sloan Leiter    Primary Care Physician:  Billie Ruddy, MD Primary Gastroenterologist: Althia Forts      Reason for Consultation: Acute on chronic anemia          HPI:   [name] [name] is a 63 y.o. female with a past medical history as listed below including PE DVT and recent MI with placement of drug-eluting stents times 21/20/20 Xarelto, Plavix and aspirin, who was admitted to [facility] 09/28/2018 with acute on chronic anemia with a complaint of shortness of breath.    Per previous physician's notes patient was admitted 09/06/2018 with an MI and underwent stent placement, started on dual antiplatelet therapy with Plavix and aspirin.  In addition to this patient is chronically anticoagulated with Xarelto given history of lupus anticoagulant syndrome resulting in pulmonary embolism as well as DVT 6 years ago.    Today, the patient explains that she came to the hospital yesterday with 2 to 3 days of shortness of breath with exertion.  Other than that she had been feeling fairly well.  Tells me she has been having normal bowel movements at home and has not seen any bright red blood or "darker than normal" bowel movements.  Tells me she is chronically on slow release iron so her stools are always dark but they have been the same recently.  No abdominal pain, heartburn or reflux.    Social history positive for being a vegetarian.    Denies fever, chills, anorexia, nausea, vomiting or weight loss.  ED course: Heart rate 70-84, blood pressure 124/64-130/59, labs with a BUN of 17 compared to 8 on 09/15/2018, creatinine 0.76 compared to 0.67 on 09/15/2018, hemoglobin 8.7 entheses 11.1 09/15/2018), MCV 92.4, fecal occult positive, given transfusion of 2 units PRBCs  Previous GI history: 05/19/2012 colonoscopy, Dr. Benson Norway at [facility]: Anterior anal fissure 05/19/2012 EGD Dr. Benson Norway: normal  Past Medical History:  Diagnosis Date  . Back pain   . DVT (deep  venous thrombosis) ([place])   . Hypertension   . Lupus anticoagulant syndrome ([place])   . Pulmonary embolism [facility])     Past Surgical History:  Procedure Laterality Date  . BACK SURGERY    . COLONOSCOPY  05/19/2012   Procedure: COLONOSCOPY;  Surgeon: Beryle Beams, MD;  Location: [place];  Service: Endoscopy;  Laterality: N/A;  . CORONARY STENT INTERVENTION N/A 09/14/2018   Procedure: CORONARY STENT INTERVENTION;  Surgeon: Martinique, Peter M, MD;  Location: [place] CV LAB;  Service: Cardiovascular;  Laterality: N/A;  . ESOPHAGOGASTRODUODENOSCOPY  05/19/2012   Procedure: ESOPHAGOGASTRODUODENOSCOPY (EGD);  Surgeon: Beryle Beams, MD;  Location: [facility] ENDOSCOPY;  Service: Endoscopy;  Laterality: N/A;  . INTRAVASCULAR PRESSURE WIRE/FFR STUDY N/A 09/14/2018   Procedure: INTRAVASCULAR PRESSURE WIRE/FFR STUDY;  Surgeon: Martinique, Peter M, MD;  Location: [place] CV LAB;  Service: Cardiovascular;  Laterality: N/A;  . LEFT HEART CATH AND CORONARY ANGIOGRAPHY N/A 09/13/2018   Procedure: LEFT HEART CATH AND CORONARY ANGIOGRAPHY;  Surgeon: Leonie Man, MD;  Location: [place] CV LAB;  Service: Cardiovascular;  Laterality: N/A;    Family History  Problem Relation Age of Onset  . Leukemia Mother   . Prostate cancer Father   . Cancer Father     Social History   Tobacco Use  . Smoking status: Current Every Day Smoker    Packs/day: 0.50    Years: 29.00    Pack years: 14.50    Types:  Cigarettes  . Smokeless tobacco: Never Used  Substance Use Topics  . Alcohol use: Yes    Comment: Occasional  . Drug use: No    Prior to Admission medications   Medication Sig Start Date End Date Taking? Authorizing Provider  acetaminophen (TYLENOL) 500 MG tablet Take 1,000 mg by mouth every 6 (six) hours as needed for mild pain.    Yes [provider]  aspirin EC 81 MG EC tablet Take 1 tablet (81 mg total) by mouth daily for 30 days. 09/15/18 10/15/18 Yes Elodia Florence., MD  bisacodyl  (DULCOLAX) 5 MG EC tablet Take 5 mg by mouth daily as needed for moderate constipation.   Yes [provider]  carvedilol (COREG) 6.25 MG tablet Take 1 tablet (6.25 mg total) by mouth 2 (two) times daily with a meal for 30 days. 09/24/18 10/24/18 Yes Kilroy, Doreene Burke, PA-C  clopidogrel (PLAVIX) 75 MG tablet Take 1 tablet (75 mg total) by mouth daily. 09/24/18 11/23/18 Yes Kilroy, Doreene Burke, PA-C  Ferrous Sulfate (SLOW FE PO) Take 1 tablet by mouth daily.   Yes [provider]  lisinopril (PRINIVIL,ZESTRIL) 40 MG tablet Take 1 tablet (40 mg total) by mouth daily. 09/08/18  Yes Billie Ruddy, MD  nitroGLYCERIN (NITROSTAT) 0.4 MG SL tablet Place 1 tablet (0.4 mg total) under the tongue every 5 (five) minutes as needed for up to 3 doses for chest pain. 09/15/18  Yes Elodia Florence., MD  rivaroxaban (XARELTO) 20 MG TABS tablet Take 1 tablet (20 mg total) by mouth daily. 11/26/17  Yes Hawks, Christy A, FNP  rosuvastatin (CRESTOR) 10 MG tablet Take 1 tablet (10 mg total) by mouth daily at 6 PM for 30 days. 09/24/18 10/24/18 Yes Kilroy, Doreene Burke, PA-C    Current Facility-Administered Medications  Medication Dose Route Frequency Provider Last Rate Last Dose  . 0.9 %  sodium chloride infusion (Manually program via Guardrails IV Fluids)   Intravenous Once Howerter, Justin B, DO      . aspirin EC tablet 81 mg  81 mg Oral Daily Howerter, Justin B, DO   81 mg at 09/29/18 0900  . clopidogrel (PLAVIX) tablet 75 mg  75 mg Oral Daily Howerter, Justin B, DO   75 mg at 09/29/18 0900  . fentaNYL (SUBLIMAZE) injection 25 mcg  25 mcg Intravenous Q2H PRN Howerter, Justin B, DO      . nicotine (NICODERM CQ - dosed in mg/24 hours) patch 14 mg  14 mg Transdermal Daily PRN Howerter, Justin B, DO      . ondansetron (ZOFRAN) tablet 4 mg  4 mg Oral Q6H PRN Howerter, Justin B, DO       Or  . ondansetron (ZOFRAN) injection 4 mg  4 mg Intravenous Q6H PRN Howerter, Justin B, DO      . rosuvastatin (CRESTOR) tablet 10 mg  10  mg Oral q1800 Howerter, Justin B, DO      . sodium chloride flush (NS) 0.9 % injection 3 mL  3 mL Intravenous Once Howerter, Justin B, DO        Allergies as of 09/28/2018 - Review Complete 09/28/2018  Allergen Reaction Noted  . Prednisone Other (See Comments) 04/20/2012  . Tramadol Other (See Comments) 04/20/2012     Review of Systems:    Constitutional: No weight loss, fever or chills Skin: No rash  Cardiovascular: No chest pain Respiratory: No SOB Gastrointestinal: See HPI and otherwise negative Genitourinary: No dysuria  Neurological: No  headache, dizziness or syncope Musculoskeletal: No new muscle or joint pain Hematologic: No bleeding  Psychiatric: No history of depression or anxiety    Physical Exam:  Vital signs in last 24 hours: Temp:  [98 F (36.7 C)-98.8 F (37.1 C)] 98.3 F (36.8 C) (02/12 0505) Pulse Rate:  [70-93] 89 (02/12 0505) Resp:  [11-20] 16 (02/12 0505) BP: (105-138)/(50-91) 123/61 (02/12 0505) SpO2:  [98 %-100 %] 99 % (02/12 0505) Weight:  [68.8 kg] 68.8 kg (02/11 2346) Last BM Date: (PTA) General:   Pleasant Caucasian female appears to be in NAD, Well developed, Well nourished, alert and cooperative Head:  Normocephalic and atraumatic. Eyes:   PEERL, EOMI. No icterus. Conjunctiva pink. Ears:  Normal auditory acuity. Neck:  Supple Throat: Oral cavity and pharynx without inflammation, swelling or lesion. Lungs: Respirations even and unlabored. Lungs clear to auscultation bilaterally.   No wheezes, crackles, or rhonchi.  Heart: Normal S1, S2. No MRG. Regular rate and rhythm. No peripheral edema, cyanosis or pallor.  Abdomen:  Soft, nondistended, nontender. No rebound or guarding. Normal bowel sounds. No appreciable masses or hepatomegaly. Rectal:  Not performed.  Msk:  Symmetrical without gross deformities. Peripheral pulses intact.  Extremities:  Without edema, no deformity or joint abnormality. Neurologic:  Alert and  oriented x4;  grossly normal  neurologically.  Skin:   Dry and intact without significant lesions or rashes. Psychiatric: Demonstrates good judgement and reason without abnormal affect or behaviors.   LAB RESULTS: Recent Labs    09/28/18 1417 09/29/18 0337  WBC 13.0* 10.6*  HGB 8.7* 10.7*  HCT 28.1* 31.8*  PLT 590* 459*   BMET Recent Labs    09/28/18 1417 09/29/18 0337  NA 137 140  K 4.2 3.8  CL 105 107  CO2 22 24  GLUCOSE 112* 90  BUN 17 13  CREATININE 0.76 0.77  CALCIUM 9.4 8.9   LFT Recent Labs    09/29/18 0337  PROT 6.4*  ALBUMIN 3.2*  AST 13*  ALT 13  ALKPHOS 77  BILITOT 0.8   PT/INR Recent Labs    09/29/18 0337  LABPROT 14.8  INR 1.17    STUDIES: Dg Chest 2 View  Result Date: 09/28/2018 CLINICAL DATA:  Patient had a myocardial infarction on January 23rd and had stent placed 2 weeks ago. Patient began having left arm pit pain and dyspnea today. EXAM: CHEST - 2 VIEW COMPARISON:  2620 FINDINGS: The heart size and mediastinal contours are within normal limits. Mild ectasia of the thoracic aorta without aneurysm. Both lungs are clear. The visualized skeletal structures are unremarkable. Bone island projects over the right shoulder versus an intra-articular loose body, unchanged in appearance. IMPRESSION: No active cardiopulmonary disease. Electronically Signed   By: Ashley Royalty M.D.   On: 09/28/2018 14:57    Impression / Plan:   Impression: 1.  Acute on chronic anemia: Baseline hemoglobin 11-12, presented with shortness of breath, hemoglobin 8.7, fecal occult positive, received 2 units PRBCs hemoglobin now 10.7, last EGD and colon in 2013 significant only for an anal fissure; Consider GI source of blood loss likely exacerbated by recent initiation of dual antiplatelet therapy after her stents placed on top of Xarelto 2.  CAD: Currently on aspirin and Plavix given recent stent placement 3 weeks ago 3.  Lupus anticoagulant syndrome: Chronically on Xarelto for history of PE and DVT, this  has been held  Plan: 1.  Discussed with patient that she will likely need EGD and colonoscopy at some time.  Currently she is still on blood thinners.  Will need to discuss timing with Dr. Havery Moros.  Did discuss risks, benefits, limitations and alternatives and the patient agrees to proceed when necessary. 2.  Will adjust diet today given that we will not be doing procedures today. 3.  Please await further recommendations from Dr. Havery Moros.  Thank you for your kind consultation, we will continue to follow.  Lavone Nian [facility]  09/29/2018, 11:41 AM  Addendum: Scheduled for EGD/Colonoscopy tomorrow at 11:00am with Dr. Havery Moros. Patient can be on clears today and will start prep this afternoon, NPO after midnight. Will contact cardiology to have them ok patient for procedures tomorrow given recent stent and anticoagulation.  Ellouise Newer, PA-C

## 2018-09-30 ENCOUNTER — Encounter (HOSPITAL_COMMUNITY): Payer: Self-pay

## 2018-09-30 ENCOUNTER — Inpatient Hospital Stay (HOSPITAL_COMMUNITY): Payer: BLUE CROSS/BLUE SHIELD | Admitting: Anesthesiology

## 2018-09-30 ENCOUNTER — Encounter (HOSPITAL_COMMUNITY)
Admission: EM | Disposition: A | Discharge status: Home / Self Care | Payer: Self-pay | Source: Home / Self Care | Attending: Internal Medicine

## 2018-09-30 DIAGNOSIS — K5520 Angiodysplasia of colon without hemorrhage: Secondary | ICD-10-CM

## 2018-09-30 DIAGNOSIS — D122 Benign neoplasm of ascending colon: Secondary | ICD-10-CM

## 2018-09-30 DIAGNOSIS — K5730 Diverticulosis of large intestine without perforation or abscess without bleeding: Secondary | ICD-10-CM

## 2018-09-30 DIAGNOSIS — K449 Diaphragmatic hernia without obstruction or gangrene: Secondary | ICD-10-CM

## 2018-09-30 DIAGNOSIS — D123 Benign neoplasm of transverse colon: Secondary | ICD-10-CM

## 2018-09-30 HISTORY — PX: HOT HEMOSTASIS: SHX5433

## 2018-09-30 HISTORY — PX: GIVENS CAPSULE STUDY: SHX5432

## 2018-09-30 HISTORY — PX: COLONOSCOPY WITH PROPOFOL: SHX5780

## 2018-09-30 HISTORY — PX: ESOPHAGOGASTRODUODENOSCOPY (EGD) WITH PROPOFOL: SHX5813

## 2018-09-30 LAB — CBC
HCT: 33 % — ABNORMAL LOW (ref 36.0–46.0)
Hemoglobin: 10.9 g/dL — ABNORMAL LOW (ref 12.0–15.0)
MCH: 29.2 pg (ref 26.0–34.0)
MCHC: 33 g/dL (ref 30.0–36.0)
MCV: 88.5 fL (ref 80.0–100.0)
Platelets: 486 10*3/uL — ABNORMAL HIGH (ref 150–400)
RBC: 3.73 MIL/uL — ABNORMAL LOW (ref 3.87–5.11)
RDW: 14 % (ref 11.5–15.5)
WBC: 10.1 10*3/uL (ref 4.0–10.5)
nRBC: 0 % (ref 0.0–0.2)

## 2018-09-30 SURGERY — ESOPHAGOGASTRODUODENOSCOPY (EGD) WITH PROPOFOL
Anesthesia: Monitor Anesthesia Care

## 2018-09-30 MED ORDER — LACTATED RINGERS IV SOLN
INTRAVENOUS | Status: DC
  Administered 2018-09-30: 1000 mL via INTRAVENOUS

## 2018-09-30 MED ORDER — SODIUM CHLORIDE 0.9 % IV SOLN
INTRAVENOUS | Status: DC | PRN
  Administered 2018-09-30: 25 ug/min via INTRAVENOUS

## 2018-09-30 MED ORDER — SODIUM CHLORIDE 0.9 % IV SOLN: INTRAVENOUS | Status: DC

## 2018-09-30 MED ORDER — PROPOFOL 10 MG/ML IV BOLUS
INTRAVENOUS | Status: DC | PRN
  Administered 2018-09-30: 20 mg via INTRAVENOUS
  Administered 2018-09-30: 10 mg via INTRAVENOUS

## 2018-09-30 MED ORDER — PROPOFOL 500 MG/50ML IV EMUL
INTRAVENOUS | Status: DC | PRN
  Administered 2018-09-30: 11:00:00 via INTRAVENOUS
  Administered 2018-09-30: 75 ug/kg/min via INTRAVENOUS

## 2018-09-30 SURGICAL SUPPLY — 25 items

## 2018-09-30 NOTE — Transfer of Care (Signed)
Immediate Anesthesia Transfer of Care Note  Patient: [name] [name]  Procedure(s) Performed: ESOPHAGOGASTRODUODENOSCOPY (EGD) WITH PROPOFOL (N/A ) COLONOSCOPY WITH PROPOFOL (N/A ) HOT HEMOSTASIS (ARGON PLASMA COAGULATION/BICAP) (N/A )  Patient Location: Endoscopy Unit  Anesthesia Type:MAC  Level of Consciousness: drowsy  Airway & Oxygen Therapy: Patient Spontanous Breathing and Patient connected to nasal cannula oxygen  Post-op Assessment: Report given to RN and Post -op Vital signs reviewed and stable  Post vital signs: Reviewed and stable  Last Vitals:  Vitals Value Taken Time  BP 104/41 09/30/2018 11:41 AM  Temp 36.5 C 09/30/2018 11:41 AM  Pulse 74 09/30/2018 11:43 AM  Resp 19 09/30/2018 11:43 AM  SpO2 100 % 09/30/2018 11:43 AM  Vitals shown include unvalidated device data.  Last Pain:  Vitals:   09/30/18 1141  TempSrc: Oral  PainSc: 0-No pain         Complications: No apparent anesthesia complications

## 2018-09-30 NOTE — Anesthesia Preprocedure Evaluation (Addendum)
Anesthesia Evaluation  Patient identified by MRN, date of birth, ID band Patient awake    Reviewed: Allergy & Precautions, NPO status , Patient's Chart, lab work & pertinent test results, reviewed documented beta blocker date and time   Airway Mallampati: III  TM Distance: >3 FB Neck ROM: Full    Dental no notable dental hx. (+) Teeth Intact, Dental Advisory Given   Pulmonary Current Smoker, PE   Pulmonary exam normal breath sounds clear to auscultation       Cardiovascular hypertension, Pt. on medications and Pt. on home beta blockers + CAD, + Past MI, + Cardiac Stents (Jan 2020) and + DVT  Normal cardiovascular exam Rhythm:Regular Rate:Normal  TTE 08/2018 EF 45-50%, diffuse hypokinesis, moderate PR  LHC 09/14/18 Prox LAD-1 lesion is 35% stenosed with 20% stenosed side branch in Ost 1st Diag. Prox LAD-2 lesion is 80% stenosed with 90% stenosed side branch in Ost 1st Sept. A drug-eluting stent was successfully placed using a STENT RESOLUTE ONYX 3.0X22. Post intervention, there is a 0% residual stenosis. Prox Cx to Mid Cx lesion is 99% stenosed. Mid Cx lesion is 55% stenosed. A drug-eluting stent was successfully placed using a STENT RESOLUTE ONYX 3.5X30. Post intervention, there is a 0% residual stenosis. Prox RCA lesion is 70% stenosed. DFR was 0.99   1. Successful PCI of the mid LCx with DES x 1 2. Successful PCI of the proximal to mid LAD with DES x 1. 3. Proximal RCA lesion with normal DFR of 0.99   Neuro/Psych negative neurological ROS  negative psych ROS   GI/Hepatic Neg liver ROS, GERD  ,  Endo/Other  negative endocrine ROS  Renal/GU negative Renal ROS  negative genitourinary   Musculoskeletal negative musculoskeletal ROS (+)   Abdominal   Peds  Hematology  (+) Blood dyscrasia (Hgb 10.9), anemia , Lupus anticoagulant syndrome   Anesthesia Other Findings EGD/colonoscopy for anemia, s/p 2U  RBCs  On plavix and xarelto  Reproductive/Obstetrics negative OB ROS                            Anesthesia Physical Anesthesia Plan  ASA: IV  Anesthesia Plan: MAC   Post-op Pain Management:    Induction: Intravenous  PONV Risk Score and Plan: 1 and Propofol infusion and Treatment may vary due to age or medical condition  Airway Management Planned: Natural Airway  Additional Equipment:   Intra-op Plan:   Post-operative Plan:   Informed Consent: I have reviewed the patients History and Physical, chart, labs and discussed the procedure including the risks, benefits and alternatives for the proposed anesthesia with the patient or authorized representative who has indicated his/her understanding and acceptance.     Dental advisory given  Plan Discussed with: CRNA  Anesthesia Plan Comments:         Anesthesia Quick Evaluation

## 2018-09-30 NOTE — Interval H&P Note (Signed)
History and Physical Interval Note:  09/30/2018 10:33 AM  [name] [name]  has presented today for surgery, with the diagnosis of Anemia  The various methods of treatment have been discussed with the patient and family. After consideration of risks, benefits and other options for treatment, the patient has consented to  Procedure(s): ESOPHAGOGASTRODUODENOSCOPY (EGD) WITH PROPOFOL (N/A) COLONOSCOPY WITH PROPOFOL (N/A) as a surgical intervention .  The patient's history has been reviewed, patient examined, no change in status, stable for surgery.  I have reviewed the patient's chart and labs.  Questions were answered to the patient's satisfaction.     [place]

## 2018-09-30 NOTE — Progress Notes (Signed)
Pt swallowed Givens Capsule at 1219 with no difficulty/[name] [name] , Therapist, sports

## 2018-09-30 NOTE — Anesthesia Procedure Notes (Signed)
Procedure Name: MAC Performed by: Tiauna Whisnant B, CRNA Pre-anesthesia Checklist: Patient identified, Emergency Drugs available, Suction available, Patient being monitored and Timeout performed Patient Re-evaluated:Patient Re-evaluated prior to induction Oxygen Delivery Method: Nasal cannula Airway Equipment and Method: Bite block Placement Confirmation: positive ETCO2 Dental Injury: Teeth and Oropharynx as per pre-operative assessment        

## 2018-09-30 NOTE — Op Note (Addendum)
[facility] Patient Name: [name] [name] Procedure Date : 09/30/2018 MRN: [id number] Attending MD: Carlota Raspberry. Havery Moros , MD Date of Birth: 11/19/1955 CSN: [id number] Age: 63 Admit Type: Inpatient Procedure:                Colonoscopy Indications:              Heme positive stool, anemia, history of PE / DVT on                            Xarelto, history of CAD with recent stent placement                            on aspirin and Plavix. Xarelto held however cannot                            stop Plavix. Providers:                Carlota Raspberry. Havery Moros, MD, Jeanella Cara,                            RN, Cletis Athens, Technician, Edmonia James, CRNA Referring MD:              Medicines:                Monitored Anesthesia Care Complications:            No immediate complications. Estimated blood loss:                            Minimal. Estimated Blood Loss:     Estimated blood loss was minimal. Procedure:                Pre-Anesthesia Assessment:                           - Prior to the procedure, a History and Physical                            was performed, and patient medications and                            allergies were reviewed. The patient's tolerance of                            previous anesthesia was also reviewed. The risks                            and benefits of the procedure and the sedation                            options and risks were discussed with the patient.                            All questions were answered, and informed consent  was obtained. Prior Anticoagulants: The patient has                            taken Plavix (clopidogrel). ASA Grade Assessment:                            III - A patient with severe systemic disease. After                            reviewing the risks and benefits, the patient was                            deemed in satisfactory condition to undergo the       procedure.                           After obtaining informed consent, the colonoscope                            was passed under direct vision. Throughout the                            procedure, the patient's blood pressure, pulse, and                            oxygen saturations were monitored continuously. The                            PCF-H190DL ([id number] Olympus pediatric colonoscope                            was introduced through the anus and advanced to the                            the terminal ileum, with identification of the                            appendiceal orifice and IC valve. The colonoscopy                            was performed without difficulty. The patient                            tolerated the procedure well. The quality of the                            bowel preparation was fair. The terminal ileum,                            ileocecal valve, appendiceal orifice, and rectum                            were photographed. Scope In: 11:05:31 AM Scope Out: 11:29:30 AM Scope Withdrawal Time: 0 hours 13 minutes  41 seconds  Total Procedure Duration: 0 hours 23 minutes 59 seconds  Findings:      The perianal and digital rectal examinations were normal.      The terminal ileum appeared normal.      A large amount of semi-liquid stool was found in the entire colon,       making visualization difficult. Lavage of the area was performed using       copious amounts of sterile water, resulting in clearance with adequate       visualization.      Two small angiodysplastic lesions without bleeding were found in the       descending colon and at the ileocecal valve. Fulguration to ablate the       lesion to prevent bleeding by argon plasma was successful.      Two sessile polyps were found in the transverse colon and ascending       colon. The polyps were 4 to 5 mm in size.      Internal hemorrhoids were found during retroflexion. The hemorrhoids       were  moderate.      The colon was tortous. The exam was otherwise without abnormality. Of       note, due to suctioning there were some red markings in the cecum, if       noted on capsule study. Impression:               - Preparation of the colon was fair, several                            minutes spent lavaging the colon to achieve                            adequate views.                           - The examined portion of the ileum was normal.                           - Two non-bleeding colonic angiodysplastic lesions.                            Treated with argon plasma coagulation (APC).                           - Two 4 to 5 mm polyps in the transverse colon and                            in the ascending colon.                           - Internal hemorrhoids.                           - The examination was otherwise normal.                           2 small AVMs noted overall. Unclear if this is  related to anemia / symptoms, or red herring. They                            were treated but would recommend small bowel                            capsule endoscopy given need for anticoagulation                            moving forward. Recommendation:           - Return patient to hospital ward for ongoing care.                           - Clear liquid once post capsule                           - Continue present medications including Plavix.                            Hold Xarelto for now                           - Would recommend capsule endoscopy to clear the                            small bowel, will discuss with patient Procedure Code(s):        --- Professional ---                           [phone], Colonoscopy, flexible; with control of                            bleeding, any method Diagnosis Code(s):        --- Professional ---                           K55.20, Angiodysplasia of colon without hemorrhage                           D12.3, Benign  neoplasm of transverse colon (hepatic                            flexure or splenic flexure)                           D12.2, Benign neoplasm of ascending colon                           R19.5, Other fecal abnormalities CPT copyright 2018 [employer]. All rights reserved. The codes documented in this report are preliminary and upon coder review may  be revised to meet current compliance requirements. Remo Lipps P. Armbruster, MD 09/30/2018 11:44:14 AM This report has been signed electronically. Number of Addenda: 0

## 2018-09-30 NOTE — Op Note (Signed)
[facility] Patient Name: [name] [name] Procedure Date : 09/30/2018 MRN: [id number] Attending MD: Carlota Raspberry. Havery Moros , MD Date of Birth: October 25, 1955 CSN: [id number] Age: 63 Admit Type: Inpatient Procedure:                Upper GI endoscopy Indications:              Heme positive stool, anemia, on Xarelto for history                            of PE / DVT, history of aspirin and Plavix use with                            recent coronary stent, cannot hold Plavix Providers:                Carlota Raspberry. Havery Moros, MD, Jeanella Cara,                            RN, Cletis Athens, Technician, Edmonia James, CRNA Referring MD:              Medicines:                Monitored Anesthesia Care Complications:            No immediate complications. Estimated blood loss:                            None. Estimated Blood Loss:     Estimated blood loss: none. Procedure:                Pre-Anesthesia Assessment:                           - Prior to the procedure, a History and Physical                            was performed, and patient medications and                            allergies were reviewed. The patient's tolerance of                            previous anesthesia was also reviewed. The risks                            and benefits of the procedure and the sedation                            options and risks were discussed with the patient.                            All questions were answered, and informed consent                            was obtained. Prior Anticoagulants: The patient has  taken Plavix (clopidogrel), last dose was day of                            procedure. ASA Grade Assessment: III - A patient                            with severe systemic disease. After reviewing the                            risks and benefits, the patient was deemed in                            satisfactory condition to undergo the procedure.                        After obtaining informed consent, the endoscope was                            passed under direct vision. Throughout the                            procedure, the patient's blood pressure, pulse, and                            oxygen saturations were monitored continuously. The                            GIF-H190 ([id number] Olympus gastroscope was                            introduced through the mouth, and advanced to the                            second part of duodenum. The upper GI endoscopy was                            accomplished without difficulty. The patient                            tolerated the procedure well. Scope In: Scope Out: Findings:      Esophagogastric landmarks were identified: the Z-line was found at 37       cm, the gastroesophageal junction was found at 37 cm and the upper       extent of the gastric folds was found at 38 cm from the incisors.      A 1 cm hiatal hernia was present.      The exam of the esophagus was otherwise normal.      The entire examined stomach was normal.      A medium diverticulum was found in the second portion of the duodenum.      The exam of the duodenum was otherwise normal. Impression:               - Esophagogastric landmarks identified.                           -  1 cm hiatal hernia.                           - Normal stomach.                           - Duodenal diverticulum.                           - Normal duodenum otherwise                           No cause for anemia on this exam. Recommendation:           - Return patient to hospital ward for ongoing care.                           - NPO for possible capsule endoscopy (see                            colonoscopy report for details)                           - Continue Plavix, would continue to hold Xarelto                            for now. Procedure Code(s):        --- Professional ---                           [phone],  Esophagogastroduodenoscopy, flexible,                            transoral; diagnostic, including collection of                            specimen(s) by brushing or washing, when performed                            (separate procedure) Diagnosis Code(s):        --- Professional ---                           K44.9, Diaphragmatic hernia without obstruction or                            gangrene                           R19.5, Other fecal abnormalities                           K57.10, Diverticulosis of small intestine without                            perforation or abscess without bleeding CPT copyright 2018 [employer]. All rights reserved. The codes documented in this report are preliminary and upon coder review may  be revised  to meet current compliance requirements. Remo Lipps P. Darrelle Barrell, MD 09/30/2018 11:53:23 AM This report has been signed electronically. Number of Addenda: 0

## 2018-09-30 NOTE — Progress Notes (Signed)
PROGRESS NOTE        PATIENT DETAILS Name: [name] [name] Age: 63 y.o. Sex: female Date of Birth: [birth year] Admit Date: 09/28/2018 Admitting Physician Rhetta Mura, DO KDX:IPJAS, Langley Adie, MD  Brief Narrative: Patient is a 63 y.o. female with history of recurrent VTE due to lupus anticoagulant,, recent non-STEMI requiring LHC and PCI to circumflex and LAD with DES-on aspirin/Plavix/Xarelto presenting with exertional dyspnea, found to have anemia.  See below for further details  Subjective: Brown stools with colonoscopy prep yesterday-wants to go home  Assessment/Plan: Probable recent upper GI bleeding with subacute blood loss anemia: Brown stools with colonoscopy prep-now only on Plavix.  Per cardiology no need for aspirin.  Xarelto on hold.  Underwent EGD/colonoscopy he was negative, too small AVMs were found in colonoscopy.  Recommendations from GI are to proceed with a capsule endoscopy.  Continue to follow CBC but bleeding seems to have resolved.  CAD with PCI to mid LCx and proximal LAD with DES on 09/14/2018: No anginal symptoms-given stability of GI bleeding-remains on Plavix, per cardiology no need for further aspirin.    Recurrent venous thromboembolism due to lupus anticoagulant: Patient has had prior PE and DVT-maintained on Xarelto-which is currently held at this point.  Await GI evaluation.  Hypertension: Blood pressure stable-resume lisinopril and Coreg when able.  DVT Prophylaxis: SCD's  Code Status: Full code   Family Communication: Spouse at bedside  Disposition Plan: Remain inpatient  Antimicrobial agents: Anti-infectives (From admission, onward)   None      Procedures: None  CONSULTS:  cardiology and GI  Time spent: 25- minutes-Greater than 50% of this time was spent in counseling, explanation of diagnosis, planning of further management, and coordination of care.  MEDICATIONS: Scheduled Meds: . sodium  chloride   Intravenous Once  . clopidogrel  75 mg Oral Daily  . pantoprazole (PROTONIX) IV  40 mg Intravenous Q12H  . rosuvastatin  10 mg Oral q1800  . sodium chloride flush  3 mL Intravenous Once   Continuous Infusions: PRN Meds:.fentaNYL (SUBLIMAZE) injection, nicotine, ondansetron **OR** ondansetron (ZOFRAN) IV   PHYSICAL EXAM: Vital signs: Vitals:   09/30/18 1141 09/30/18 1150 09/30/18 1200 09/30/18 1210  BP: (!) 104/41 (!) 125/50 (!) 153/51 (!) 126/53  Pulse: 78 73 67 62  Resp: 20 (!) 24 14 20   Temp: 97.7 F (36.5 C)     TempSrc: Oral     SpO2: 100% 97% 100% 100%  Weight:      Height:       Filed Weights   09/28/18 2346 09/30/18 0500  Weight: 68.8 kg 68.9 kg   Body mass index is 25.67 kg/m.   General appearance:Awake, alert, not in any distress.  Eyes:no scleral icterus. HEENT: Atraumatic and Normocephalic Neck: supple, no JVD. Resp:Good air entry bilaterally,no rales or rhonchi CVS: S1 S2 regular, no murmurs.  GI: Bowel sounds present, Non tender and not distended with no gaurding, rigidity or rebound. Extremities: B/L Lower Ext shows no edema, both legs are warm to touch Neurology:  Non focal Psychiatric: Normal judgment and insight. Normal mood. Musculoskeletal:No digital cyanosis Skin:No Rash, warm and dry Wounds:N/A  I have personally reviewed following labs and imaging studies  LABORATORY DATA: CBC: Recent Labs  Lab 09/28/18 1417 09/29/18 0337 09/30/18 0355  WBC 13.0* 10.6* 10.1  HGB 8.7* 10.7* 10.9*  HCT 28.1* 31.8*  33.0*  MCV 92.4 87.4 88.5  PLT 590* 459* 486*    Basic Metabolic Panel: Recent Labs  Lab 09/28/18 1417 09/29/18 0337  NA 137 140  K 4.2 3.8  CL 105 107  CO2 22 24  GLUCOSE 112* 90  BUN 17 13  CREATININE 0.76 0.77  CALCIUM 9.4 8.9  MG  --  2.2    GFR: Estimated Creatinine Clearance: 69.4 mL/min (by C-G formula based on SCr of 0.77 mg/dL).  Liver Function Tests: Recent Labs  Lab 09/29/18 0337  AST 13*  ALT 13    ALKPHOS 77  BILITOT 0.8  PROT 6.4*  ALBUMIN 3.2*   No results for input(s): LIPASE, AMYLASE in the last 168 hours. No results for input(s): AMMONIA in the last 168 hours.  Coagulation Profile: Recent Labs  Lab 09/29/18 0337  INR 1.17    Cardiac Enzymes: No results for input(s): CKTOTAL, CKMB, CKMBINDEX, TROPONINI in the last 168 hours.  BNP (last 3 results) No results for input(s): PROBNP in the last 8760 hours.  HbA1C: No results for input(s): HGBA1C in the last 72 hours.  CBG: No results for input(s): GLUCAP in the last 168 hours.  Lipid Profile: No results for input(s): CHOL, HDL, LDLCALC, TRIG, CHOLHDL, LDLDIRECT in the last 72 hours.  Thyroid Function Tests: No results for input(s): TSH, T4TOTAL, FREET4, T3FREE, THYROIDAB in the last 72 hours.  Anemia Panel: No results for input(s): VITAMINB12, FOLATE, FERRITIN, TIBC, IRON, RETICCTPCT in the last 72 hours.  Urine analysis:    Component Value Date/Time   COLORURINE YELLOW 09/13/2018 1418   APPEARANCEUR CLEAR 09/13/2018 1418   APPEARANCEUR Clear 12/03/2015 1122   LABSPEC 1.019 09/13/2018 1418   PHURINE 5.0 09/13/2018 1418   GLUCOSEU NEGATIVE 09/13/2018 1418   HGBUR MODERATE (A) 09/13/2018 1418   BILIRUBINUR NEGATIVE 09/13/2018 1418   BILIRUBINUR Negative 12/03/2015 1122   KETONESUR NEGATIVE 09/13/2018 1418   PROTEINUR NEGATIVE 09/13/2018 1418   UROBILINOGEN negative 06/21/2015 1654   UROBILINOGEN 0.2 05/22/2012 0254   NITRITE NEGATIVE 09/13/2018 1418   LEUKOCYTESUR NEGATIVE 09/13/2018 1418   LEUKOCYTESUR Trace (A) 12/03/2015 1122    Sepsis Labs: Lactic Acid, Venous    Component Value Date/Time   LATICACIDVEN 0.9 05/18/2012 1451    MICROBIOLOGY: No results found for this or any previous visit (from the past 240 hour(s)).  RADIOLOGY STUDIES/RESULTS: Dg Chest 2 View  Result Date: 09/28/2018 CLINICAL DATA:  Patient had a myocardial infarction on January 23rd and had stent placed 2 weeks ago.  Patient began having left arm pit pain and dyspnea today. EXAM: CHEST - 2 VIEW COMPARISON:  2620 FINDINGS: The heart size and mediastinal contours are within normal limits. Mild ectasia of the thoracic aorta without aneurysm. Both lungs are clear. The visualized skeletal structures are unremarkable. Bone island projects over the right shoulder versus an intra-articular loose body, unchanged in appearance. IMPRESSION: No active cardiopulmonary disease. Electronically Signed   By: Ashley Royalty M.D.   On: 09/28/2018 14:57   Dg Chest 2 View  Result Date: 09/12/2018 CLINICAL DATA:  Chest pain for several days EXAM: CHEST - 2 VIEW COMPARISON:  09/10/18 FINDINGS: Cardiac shadow is stable. Aortic calcifications are again seen. The lungs are clear bilaterally. No acute bony abnormality is noted. IMPRESSION: No acute abnormality noted.  No change from the prior exam. Electronically Signed   By: Inez Catalina M.D.   On: 09/12/2018 16:50     LOS: 2 days   Oren Binet, MD  [facility]  If 7PM-7AM, please contact night-coverage  Please page via [email or web address]  Go to [email or web address] and use [facility] universal password to access. If you do not have the password, please contact the hospital operator.  Locate the [facility] provider you are looking for under [facility] and page to a number that you can be directly reached. If you still have difficulty reaching the provider, please page the [facility] (Director on Call) for the Hospitalists listed on amion for assistance.  09/30/2018, 1:36 PM

## 2018-10-01 ENCOUNTER — Telehealth: Payer: Self-pay | Admitting: Gastroenterology

## 2018-10-01 DIAGNOSIS — D62 Acute posthemorrhagic anemia: Principal | ICD-10-CM

## 2018-10-01 LAB — CBC
HCT: 33.9 % — ABNORMAL LOW (ref 36.0–46.0)
Hemoglobin: 10.8 g/dL — ABNORMAL LOW (ref 12.0–15.0)
MCH: 28.3 pg (ref 26.0–34.0)
MCHC: 31.9 g/dL (ref 30.0–36.0)
MCV: 88.7 fL (ref 80.0–100.0)
Platelets: 495 10*3/uL — ABNORMAL HIGH (ref 150–400)
RBC: 3.82 MIL/uL — ABNORMAL LOW (ref 3.87–5.11)
RDW: 14.1 % (ref 11.5–15.5)
WBC: 9.5 10*3/uL (ref 4.0–10.5)
nRBC: 0 % (ref 0.0–0.2)

## 2018-10-01 MED ORDER — ROSUVASTATIN CALCIUM 10 MG PO TABS: 10.0000 mg | ORAL_TABLET | Freq: Every day | ORAL | 6 refills | Status: DC

## 2018-10-01 MED ORDER — PANTOPRAZOLE SODIUM 40 MG PO TBEC: 40.0000 mg | DELAYED_RELEASE_TABLET | Freq: Every day | ORAL | 1 refills | Status: DC

## 2018-10-01 NOTE — Discharge Summary (Signed)
PATIENT DETAILS Name: [name] [name] Age: 63 y.o. Sex: female Date of Birth: May 04, 1956 MRN: [id number]. Admitting Physician: Rhetta Mura, DO SAY:TKZSW, Langley Adie, MD  Admit Date: 09/28/2018 Discharge date: 10/01/2018  Recommendations for Outpatient Follow-up:  1. Follow up with PCP in 1-2 weeks 2. Please obtain BMP/CBC in one week 3. No longer on aspirin, will be continued on Xarelto (starting 2/15) and Plavix. 4. Will need IV iron infusion in the next 2 weeks-PCP to arrange 5. Please ensure follow-up with GI and cardiology 6. Capsule endoscopy results pending-please follow  Admitted From:  Home  Disposition: [place]: No  Equipment/Devices: None  Discharge Condition: Stable  CODE STATUS: FULL CODE  Diet recommendation:  Heart Healthy   Brief Summary: See H&P, Labs, Consult and Test reports for all details in brief,Patient is a 63 y.o. female with history of recurrent VTE due to lupus anticoagulant,, recent non-STEMI requiring LHC and PCI to circumflex and LAD with DES-on aspirin/Plavix/Xarelto presenting with exertional dyspnea, found to have anemia.  See below for further details  Brief Hospital Course: Probable recent upper GI bleeding with subacute blood loss anemia:  Resolved-patient had Brown stools with colonoscopy prep-now only on Plavix.  Hemoglobin stable at 10.8. Per cardiology no need for aspirin.  Xarelto was held on admission. old.  Underwent EGD/colonoscopy he was negative, too small AVMs were found in colonoscopy.  Underwent capsule endoscopy on 2/13-results still pending.  However since bleeding has resolved-and after discussion with Dr. Havery Moros this morning-recommendations are to continue with Plavix, and resume Xarelto starting on 2/15.  This recommendation was discussed at length with the patient and spouse-both of them are agreeable.  We have asked the patient to stop taking oral iron supplementation-and watch her stools very  closely-if she has black stools, bloody stools or vomitus that she knows she has to come back to the emergency room right away.  Gastroenterology will get in touch with the patient when they have final report of capsule endoscopy.   CAD with PCI to mid LCx and proximal LAD with DES on 09/14/2018: No anginal symptoms-given stability of GI bleeding-remains on Plavix, per cardiology no need for further aspirin.    Recurrent venous thromboembolism due to lupus anticoagulant: Patient has had prior PE and DVT-maintained on Xarelto-which was held on the admission.  Plans are to resume starting 2/15.   Hypertension: Blood pressure stable-resume lisinopril and Coreg on discharge  Procedures/Studies: 2/13>> EGD, colonoscopy and capsule endoscopy  Discharge Diagnoses:  Principal Problem:   Acute on chronic anemia Active Problems:   Essential hypertension   Acute lower GI bleeding   Hypercoagulation syndrome (HCC)   CAD -S/P PCI   Heme positive stool   Antiplatelet or antithrombotic long-term use   Discharge Instructions:  Activity:  As tolerated    Discharge Instructions    Call MD for:   Complete by:  As directed    Bloody stools, black tarry stools or if you vomit blood.   Diet - low sodium heart healthy   Complete by:  As directed    Discharge instructions   Complete by:  As directed    Follow with Primary MD  Billie Ruddy, MD in 1 week  Stop aspirin and iron tablets.  If you have stools turn black or bloody please seek immediate medical attention.  Please ask your primary care practitioner to arrange for outpatient IV iron infusions.  Please get a complete blood count and chemistry panel checked  by your Primary MD at your next visit, and again as instructed by your Primary MD.  Get Medicines reviewed and adjusted: Please take all your medications with you for your next visit with your Primary MD  Laboratory/radiological data: Please request your Primary MD to go over  all hospital tests and procedure/radiological results at the follow up, please ask your Primary MD to get all Hospital records sent to his/her office.  In some cases, they will be blood work, cultures and biopsy results pending at the time of your discharge. Please request that your primary care M.D. follows up on these results.  Also Note the following: If you experience worsening of your admission symptoms, develop shortness of breath, life threatening emergency, suicidal or homicidal thoughts you must seek medical attention immediately by calling 911 or calling your MD immediately  if symptoms less severe.  You must read complete instructions/literature along with all the possible adverse reactions/side effects for all the Medicines you take and that have been prescribed to you. Take any new Medicines after you have completely understood and accpet all the possible adverse reactions/side effects.   Do not drive when taking Pain medications or sleeping medications (Benzodaizepines)  Do not take more than prescribed Pain, Sleep and Anxiety Medications. It is not advisable to combine anxiety,sleep and pain medications without talking with your primary care practitioner  Special Instructions: If you have smoked or chewed Tobacco  in the last 2 yrs please stop smoking, stop any regular Alcohol  and or any Recreational drug use.  Wear Seat belts while driving.  Please note: You were cared for by a hospitalist during your hospital stay. Once you are discharged, your primary care physician will handle any further medical issues. Please note that NO REFILLS for any discharge medications will be authorized once you are discharged, as it is imperative that you return to your primary care physician (or establish a relationship with a primary care physician if you do not have one) for your post hospital discharge needs so that they can reassess your need for medications and monitor your lab values.    Increase activity slowly   Complete by:  As directed      Allergies as of 10/01/2018      Reactions   Prednisone Other (See Comments)   No energy "stalls out".    Makes pt  Jittery.   Tramadol Other (See Comments)   Constipation,  Makes pt  Jittery.      Medication List    STOP taking these medications   aspirin 81 MG EC tablet   SLOW FE PO     TAKE these medications   acetaminophen 500 MG tablet Commonly known as:  TYLENOL Take 1,000 mg by mouth every 6 (six) hours as needed for mild pain.   bisacodyl 5 MG EC tablet Commonly known as:  DULCOLAX Take 5 mg by mouth daily as needed for moderate constipation.   carvedilol 6.25 MG tablet Commonly known as:  COREG Take 1 tablet (6.25 mg total) by mouth 2 (two) times daily with a meal for 30 days.   clopidogrel 75 MG tablet Commonly known as:  PLAVIX Take 1 tablet (75 mg total) by mouth daily.   lisinopril 40 MG tablet Commonly known as:  PRINIVIL,ZESTRIL Take 1 tablet (40 mg total) by mouth daily.   nitroGLYCERIN 0.4 MG SL tablet Commonly known as:  NITROSTAT Place 1 tablet (0.4 mg total) under the tongue every 5 (five) minutes as needed for  up to 3 doses for chest pain.   pantoprazole 40 MG tablet Commonly known as:  PROTONIX Take 1 tablet (40 mg total) by mouth daily.   rivaroxaban 20 MG Tabs tablet Commonly known as:  XARELTO Take 1 tablet (20 mg total) by mouth daily.   rosuvastatin 10 MG tablet Commonly known as:  CRESTOR Take 1 tablet (10 mg total) by mouth daily at 6 PM for 30 days. Start taking on:  October 02, 2018      Follow-up Information    Billie Ruddy, MD. Schedule an appointment as soon as possible for a visit in 1 week(s).   Specialty:  Family Medicine Contact information: [place] [address] [phone]        Yetta Flock, MD Follow up.   Specialty:  Gastroenterology Why:  office will call for a follow up appointment Contact information: [address] [place] [address] [phone]          Allergies  Allergen Reactions  . Prednisone Other (See Comments)    No energy "stalls out".    Makes pt  Jittery.  . Tramadol Other (See Comments)    Constipation,  Makes pt  Jittery.    Consultations:   cardiology and GI   Other Procedures/Studies: Dg Chest 2 View  Result Date: 09/28/2018 CLINICAL DATA:  Patient had a myocardial infarction on January 23rd and had stent placed 2 weeks ago. Patient began having left arm pit pain and dyspnea today. EXAM: CHEST - 2 VIEW COMPARISON:  2620 FINDINGS: The heart size and mediastinal contours are within normal limits. Mild ectasia of the thoracic aorta without aneurysm. Both lungs are clear. The visualized skeletal structures are unremarkable. Bone island projects over the right shoulder versus an intra-articular loose body, unchanged in appearance. IMPRESSION: No active cardiopulmonary disease. Electronically Signed   By: Ashley Royalty M.D.   On: 09/28/2018 14:57   Dg Chest 2 View  Result Date: 09/12/2018 CLINICAL DATA:  Chest pain for several days EXAM: CHEST - 2 VIEW COMPARISON:  09/10/18 FINDINGS: Cardiac shadow is stable. Aortic calcifications are again seen. The lungs are clear bilaterally. No acute bony abnormality is noted. IMPRESSION: No acute abnormality noted.  No change from the prior exam. Electronically Signed   By: Inez Catalina M.D.   On: 09/12/2018 16:50      TODAY-DAY OF DISCHARGE:  Subjective:   [name] [name] today has no headache,no chest abdominal pain,no new weakness tingling or numbness, feels much better wants to go home today.   Objective:   Blood pressure (!) 141/73, pulse 78, temperature 98 F (36.7 C), temperature source Oral, resp. rate 16, height 5' 4.5" (1.638 m), weight 68.3 kg, SpO2 98 %.  Intake/Output Summary (Last 24 hours) at 10/01/2018 0936 Last data filed at 09/30/2018 1222 Gross per 24 hour  Intake 500 ml  Output -  Net 500 ml    Filed Weights   09/28/18 2346 09/30/18 0500 10/01/18 0500  Weight: 68.8 kg 68.9 kg 68.3 kg    Exam: Awake Alert, Oriented *3, No new F.N deficits, Normal affect [place].AT,PERRAL Supple Neck,No JVD, No cervical lymphadenopathy appriciated.  Symmetrical Chest wall movement, Good air movement bilaterally, CTAB RRR,No Gallops,Rubs or new Murmurs, No Parasternal Heave +ve B.Sounds, Abd Soft, Non tender, No organomegaly appriciated, No rebound -guarding or rigidity. No Cyanosis, Clubbing or edema, No new Rash or bruise   PERTINENT RADIOLOGIC STUDIES: Dg Chest 2 View  Result Date:  09/28/2018 CLINICAL DATA:  Patient had a myocardial infarction on January 23rd and had stent placed 2 weeks ago. Patient began having left arm pit pain and dyspnea today. EXAM: CHEST - 2 VIEW COMPARISON:  2620 FINDINGS: The heart size and mediastinal contours are within normal limits. Mild ectasia of the thoracic aorta without aneurysm. Both lungs are clear. The visualized skeletal structures are unremarkable. Bone island projects over the right shoulder versus an intra-articular loose body, unchanged in appearance. IMPRESSION: No active cardiopulmonary disease. Electronically Signed   By: Ashley Royalty M.D.   On: 09/28/2018 14:57   Dg Chest 2 View  Result Date: 09/12/2018 CLINICAL DATA:  Chest pain for several days EXAM: CHEST - 2 VIEW COMPARISON:  09/10/18 FINDINGS: Cardiac shadow is stable. Aortic calcifications are again seen. The lungs are clear bilaterally. No acute bony abnormality is noted. IMPRESSION: No acute abnormality noted.  No change from the prior exam. Electronically Signed   By: Inez Catalina M.D.   On: 09/12/2018 16:50     PERTINENT LAB RESULTS: CBC: Recent Labs    09/30/18 0355 10/01/18 0438  WBC 10.1 9.5  HGB 10.9* 10.8*  HCT 33.0* 33.9*  PLT 486* 495*   CMET CMP     Component Value Date/Time   NA 140 09/29/2018 0337   NA 140 11/26/2017 1707   NA 142 09/22/2013 1213   K 3.8 09/29/2018  0337   K 3.7 09/22/2013 1213   CL 107 09/29/2018 0337   CL 105 09/16/2012 0821   CO2 24 09/29/2018 0337   CO2 24 09/22/2013 1213   GLUCOSE 90 09/29/2018 0337   GLUCOSE 93 09/22/2013 1213   GLUCOSE 82 09/16/2012 0821   BUN 13 09/29/2018 0337   BUN 7 (L) 11/26/2017 1707   BUN 7.6 09/22/2013 1213   CREATININE 0.77 09/29/2018 0337   CREATININE 0.7 09/22/2013 1213   CALCIUM 8.9 09/29/2018 0337   CALCIUM 10.1 09/22/2013 1213   PROT 6.4 (L) 09/29/2018 0337   PROT 7.6 11/26/2017 1707   PROT 7.7 09/22/2013 1213   ALBUMIN 3.2 (L) 09/29/2018 0337   ALBUMIN 4.4 11/26/2017 1707   ALBUMIN 4.0 09/22/2013 1213   AST 13 (L) 09/29/2018 0337   AST 14 09/22/2013 1213   ALT 13 09/29/2018 0337   ALT 9 09/22/2013 1213   ALKPHOS 77 09/29/2018 0337   ALKPHOS 105 09/22/2013 1213   BILITOT 0.8 09/29/2018 0337   BILITOT <0.2 11/26/2017 1707   BILITOT 0.30 09/22/2013 1213   GFRNONAA >60 09/29/2018 0337   GFRAA >60 09/29/2018 0337    GFR Estimated Creatinine Clearance: 69.2 mL/min (by C-G formula based on SCr of 0.77 mg/dL). No results for input(s): LIPASE, AMYLASE in the last 72 hours. No results for input(s): CKTOTAL, CKMB, CKMBINDEX, TROPONINI in the last 72 hours. Invalid input(s): POCBNP No results for input(s): DDIMER in the last 72 hours. No results for input(s): HGBA1C in the last 72 hours. No results for input(s): CHOL, HDL, LDLCALC, TRIG, CHOLHDL, LDLDIRECT in the last 72 hours. No results for input(s): TSH, T4TOTAL, T3FREE, THYROIDAB in the last 72 hours.  Invalid input(s): FREET3 No results for input(s): VITAMINB12, FOLATE, FERRITIN, TIBC, IRON, RETICCTPCT in the last 72 hours. Coags: Recent Labs    09/29/18 0337  INR 1.17   Microbiology: No results found for this or any previous visit (from the past 240 hour(s)).  FURTHER DISCHARGE INSTRUCTIONS:  Get Medicines reviewed and adjusted: Please take all your medications with you for your next visit with  your Primary  MD  Laboratory/radiological data: Please request your Primary MD to go over all hospital tests and procedure/radiological results at the follow up, please ask your Primary MD to get all Hospital records sent to his/her office.  In some cases, they will be blood work, cultures and biopsy results pending at the time of your discharge. Please request that your primary care M.D. goes through all the records of your hospital data and follows up on these results.  Also Note the following: If you experience worsening of your admission symptoms, develop shortness of breath, life threatening emergency, suicidal or homicidal thoughts you must seek medical attention immediately by calling 911 or calling your MD immediately  if symptoms less severe.  You must read complete instructions/literature along with all the possible adverse reactions/side effects for all the Medicines you take and that have been prescribed to you. Take any new Medicines after you have completely understood and accpet all the possible adverse reactions/side effects.   Do not drive when taking Pain medications or sleeping medications (Benzodaizepines)  Do not take more than prescribed Pain, Sleep and Anxiety Medications. It is not advisable to combine anxiety,sleep and pain medications without talking with your primary care practitioner  Special Instructions: If you have smoked or chewed Tobacco  in the last 2 yrs please stop smoking, stop any regular Alcohol  and or any Recreational drug use.  Wear Seat belts while driving.  Please note: You were cared for by a hospitalist during your hospital stay. Once you are discharged, your primary care physician will handle any further medical issues. Please note that NO REFILLS for any discharge medications will be authorized once you are discharged, as it is imperative that you return to your primary care physician (or establish a relationship with a primary care physician if you do not have  one) for your post hospital discharge needs so that they can reassess your need for medications and monitor your lab values.  Total Time spent coordinating discharge including counseling, education and face to face time equals 35 minutes.  SignedOren Binet 10/01/2018 9:36 AM

## 2018-10-01 NOTE — Telephone Encounter (Signed)
Called patient to relay results of capsule endoscopy done 2/14.  Rapid small bowel transit 2 small AVMs vs. Red markings in the proximal duodenum. Otherwise normal small bowel. Unclear if this is simply a change from endoscopic trauma given capsule was placed immediately following endoscopy and these were not seen there. Colonoscopy was performed a 2 AVMs noted and treated. Red markings noted in the cecum were seen on colonoscopy and due to suctioning prep.   Moving forward, she is on Plavix and aspirin has been stopped. She will resume Xarelto in the next day or so. I recommend we monitor her Hgb closely, repeat CBC later this week. If any bleeding she needs to contact us. If anemia recurs would repeat EGD based on capsule findings.  She did not answer the phone, left message, will call back.

## 2018-10-01 NOTE — Anesthesia Postprocedure Evaluation (Signed)
Anesthesia Post Note  Patient: Arelyn Gauer  Procedure(s) Performed: ESOPHAGOGASTRODUODENOSCOPY (EGD) WITH PROPOFOL (N/A ) COLONOSCOPY WITH PROPOFOL (N/A ) HOT HEMOSTASIS (ARGON PLASMA COAGULATION/BICAP) (N/A ) GIVENS CAPSULE STUDY (N/A )     Patient location during evaluation: Endoscopy Anesthesia Type: MAC Level of consciousness: awake and alert Pain management: pain level controlled Vital Signs Assessment: post-procedure vital signs reviewed and stable Respiratory status: spontaneous breathing, nonlabored ventilation, respiratory function stable and patient connected to nasal cannula oxygen Cardiovascular status: blood pressure returned to baseline and stable Postop Assessment: no apparent nausea or vomiting Anesthetic complications: no    Last Vitals:  Vitals:   09/30/18 1355 10/01/18 0449  BP: 135/65 (!) 141/73  Pulse: 73 78  Resp: 18 16  Temp: 36.6 C 36.7 C  SpO2: 99% 98%    Last Pain:  Vitals:   10/01/18 0449  TempSrc: Oral  PainSc:                  Chelsey L Woodrum

## 2018-10-06 ENCOUNTER — Ambulatory Visit (INDEPENDENT_AMBULATORY_CARE_PROVIDER_SITE_OTHER): Payer: BLUE CROSS/BLUE SHIELD | Admitting: Family Medicine

## 2018-10-06 ENCOUNTER — Encounter: Payer: Self-pay | Admitting: Family Medicine

## 2018-10-06 VITALS — BP 136/80 | HR 72 | Temp 98.2°F | Wt 152.0 lb

## 2018-10-06 DIAGNOSIS — D508 Other iron deficiency anemias: Secondary | ICD-10-CM | POA: Diagnosis not present

## 2018-10-06 DIAGNOSIS — I10 Essential (primary) hypertension: Secondary | ICD-10-CM | POA: Diagnosis not present

## 2018-10-06 DIAGNOSIS — F17210 Nicotine dependence, cigarettes, uncomplicated: Secondary | ICD-10-CM

## 2018-10-06 DIAGNOSIS — K922 Gastrointestinal hemorrhage, unspecified: Secondary | ICD-10-CM

## 2018-10-06 DIAGNOSIS — I214 Non-ST elevation (NSTEMI) myocardial infarction: Secondary | ICD-10-CM | POA: Diagnosis not present

## 2018-10-06 LAB — CBC
HCT: 35.7 % — ABNORMAL LOW (ref 36.0–46.0)
Hemoglobin: 11.7 g/dL — ABNORMAL LOW (ref 12.0–15.0)
MCHC: 32.7 g/dL (ref 30.0–36.0)
MCV: 89.3 fl (ref 78.0–100.0)
Platelets: 529 10*3/uL — ABNORMAL HIGH (ref 150.0–400.0)
RBC: 4 Mil/uL (ref 3.87–5.11)
RDW: 14.6 % (ref 11.5–15.5)
WBC: 9.5 10*3/uL (ref 4.0–10.5)

## 2018-10-06 LAB — BASIC METABOLIC PANEL
BUN: 15 mg/dL (ref 6–23)
CO2: 26 mEq/L (ref 19–32)
Calcium: 9.7 mg/dL (ref 8.4–10.5)
Chloride: 104 mEq/L (ref 96–112)
Creatinine, Ser: 0.79 mg/dL (ref 0.40–1.20)
GFR: 73.42 mL/min (ref >60.00)
Glucose, Bld: 89 mg/dL (ref 70–99)
Potassium: 4.9 mEq/L (ref 3.5–5.1)
Sodium: 139 mEq/L (ref 135–145)

## 2018-10-06 NOTE — Progress Notes (Signed)
Subjective:    Patient ID: [name] [name], female    DOB: 11-13-1955, 63 y.o.   MRN: [id number]  No chief complaint on file.   HPI  Pt is a 63 yo female with recurrent DVT and PE 2/2 lupus anticoagulant, HTN, nicotine use,   Patient was seen today for hospital follow-up.  Patient was admitted 1/26-1/29/20 for NSTEMI.  Pt underwent PCI of mid LCx with DES x 1, proximal to mid LAD with DES x 1.  She was started on DAPT x 1 month, then continue plavix x 1 yr.  Resume xarelto.  Pt was then readmitted on 2/11-2/14/20 for anemia.  Pt had probable upper GIB with subacute blood loss anemia.  Pt underwent capsule endoscopy on 2/13, seen by Dr. Havery Moros.  ASA was stopped.  Plavix continued and xarelto to restart 2/15.  Hgb 10.8   Since d/c, pt states she has been doing better.  Pt states she was advised to have an iron infusion upon d/c from the hospital.  Not currently on po iron as watching for re-bleeding.  Pt denies dizziness, palpitations, BRBPR, melena.  Pt requesting refills on coreg 6.25 mg and plavix 75 mg.  BP stable.  Pt smoking cigarettes, 1/2 ppd.  Past Medical History:  Diagnosis Date  . Back pain   . DVT (deep venous thrombosis) ([place])   . Hypertension   . Lupus anticoagulant syndrome ([place])   . Pulmonary embolism (HCC)     Allergies  Allergen Reactions  . Prednisone Other (See Comments)    No energy "stalls out".    Makes pt  Jittery.  . Tramadol Other (See Comments)    Constipation,  Makes pt  Jittery.    ROS General: Denies fever, chills, night sweats, changes in weight, changes in appetite HEENT: Denies headaches, ear pain, changes in vision, rhinorrhea, sore throat CV: Denies CP, palpitations, SOB, orthopnea Pulm: Denies SOB, cough, wheezing GI: Denies abdominal pain, nausea, vomiting, diarrhea, constipation GU: Denies dysuria, hematuria, frequency, vaginal discharge Msk: Denies muscle cramps, joint pains Neuro: Denies weakness, numbness, tingling Skin: Denies  rashes, bruising Psych: Denies depression, anxiety, hallucinations     Objective:    Blood pressure 136/80, pulse 72, temperature 98.2 F (36.8 C), temperature source Oral, weight 152 lb (68.9 kg), SpO2 98 %.  Gen. Pleasant, well-nourished, in no distress, normal affect   HEENT: [place]/AT, face symmetric, conjunctiva clear, no scleral icterus, PERRLA, nares patent without drainage, pharynx without erythema or exudate. Lungs: no accessory muscle use, CTAB, no wheezes or rales Cardiovascular: RRR, no m/r/g, no peripheral edema Abdomen: BS present, soft, NT/ND Neuro:  A&Ox3, CN II-XII intact, normal gait Skin:  Warm, no lesions/ rash  Wt Readings from Last 3 Encounters:  10/06/18 152 lb (68.9 kg)  10/01/18 150 lb 9.2 oz (68.3 kg)  09/24/18 154 lb (69.9 kg)    Lab Results  Component Value Date   WBC 9.5 10/01/2018   HGB 10.8 (L) 10/01/2018   HCT 33.9 (L) 10/01/2018   PLT 495 (H) 10/01/2018   GLUCOSE 90 09/29/2018   CHOL 308 (H) 09/13/2018   TRIG 371 (H) 09/13/2018   HDL 32 (L) 09/13/2018   LDLCALC 202 (H) 09/13/2018   ALT 13 09/29/2018   AST 13 (L) 09/29/2018   NA 140 09/29/2018   K 3.8 09/29/2018   CL 107 09/29/2018   CREATININE 0.77 09/29/2018   BUN 13 09/29/2018   CO2 24 09/29/2018   TSH 1.180 01/22/2016   INR 1.17 09/29/2018  Assessment/Plan:  Other iron deficiency anemia  -worsened by acute GIB - Plan: CBC (no diff) -based on results with schedule iron infusion if needed. -continue to hold po iron at this time  Acute lower GI bleeding -pt encouraged to f/u with GI -continue to monitor for bleeding -given RTC or ED precautions -continue protonix 40 mg and dulcolax  - Plan: CBC (no diff)  NSTEMI (non-ST elevated myocardial infarction) ([place]) -discussed the importance fo smoking cessation -lifestyle modifications also encouraged -continue crestor 10 mg, coreg 6.25 mg, plavix 75 mg, and xarelto 20 mg  -f/u with cardiology encouraged.  Essential  hypertension  -controlled -continue lisinopril 40 mg and coreg 6.25 mg  -lifestyle modifications encouraged - Plan: Basic metabolic panel, CBC (no diff)  Nicotine dependence -smoking cessation >3 min, <10 min -pt to consider cutting down -discussed medication options -will continue to evaluate at each visit  F/u prn  Grier Mitts, MD

## 2018-10-08 ENCOUNTER — Other Ambulatory Visit: Payer: Self-pay

## 2018-10-08 ENCOUNTER — Other Ambulatory Visit: Payer: Self-pay | Admitting: Cardiology

## 2018-10-08 DIAGNOSIS — E611 Iron deficiency: Secondary | ICD-10-CM

## 2018-10-08 DIAGNOSIS — E785 Hyperlipidemia, unspecified: Secondary | ICD-10-CM

## 2018-10-08 NOTE — Telephone Encounter (Signed)
Called patient again, no answer, left message.  [name] [name] can you please try calling this patient again later today. Capsule showed red markings in the small bowel either due to contact trauma from recent EGD versus small AVM. She had a few AVMs treated in her colon during hospitalization.   She should be on both Plavix and Xarelto at this time, aspirin was stopped, if you can confirm. She had a recent CBC by her PCP and her Hgb was stable. Recommend repeating CBC in 3-4 weeks to ensure stable.   Thanks

## 2018-10-08 NOTE — Progress Notes (Signed)
Patient notified of the results. All questions answered New labs ordered for 4 weeks

## 2018-10-08 NOTE — Telephone Encounter (Signed)
Spoke with patient and she stated she was hurting all over with decreasing the Crestor to 10 mg daily. Advised to hold for 2 weeks and will forward to [place] for review

## 2018-10-08 NOTE — Telephone Encounter (Signed)
She needs to know that her cholesterol panel was very abnormal and that her lipids were very high.  We need to have her on something, Crestor is probably the best of the new statins.  The only other statin I would try if she feels better for 2 weeks would be Livalo.  Regardless, I think as high as her lipids are, she probably should be followed up with the [facility] pharmacist -> maybe they can see her when she comes in for follow-up visit.Glenetta Hew, MD

## 2018-10-08 NOTE — Telephone Encounter (Signed)
New Message:     Pt said shew would like to change from Crestor to another Cholesterol medicine please. She said she feels so much better, when she does not take the Crestor.

## 2018-10-11 ENCOUNTER — Ambulatory Visit: Payer: BLUE CROSS/BLUE SHIELD | Admitting: Family Medicine

## 2018-10-11 ENCOUNTER — Encounter: Payer: Self-pay | Admitting: Family Medicine

## 2018-10-11 VITALS — BP 128/66 | HR 80 | Temp 98.0°F

## 2018-10-11 DIAGNOSIS — K922 Gastrointestinal hemorrhage, unspecified: Secondary | ICD-10-CM

## 2018-10-11 DIAGNOSIS — K649 Unspecified hemorrhoids: Secondary | ICD-10-CM | POA: Diagnosis not present

## 2018-10-11 NOTE — Progress Notes (Signed)
Subjective:    Patient ID: [name] [name], female    DOB: 07-Sep-1955, 63 y.o.   MRN: [id number]  No chief complaint on file. Pt is accompanied by her husband.  HPI  Pt is a 63 yo female with pmh sig for recent NSTEMI, HTN, CAD s/p PCI, GERD, tobacco use, h/o DVT and PE on chronic anticoagulation, HLD, thrombocytosis, anemia.  Pt seen today for f/u.  Pt notes mild itching of rectum, has a h/o hemorrhoids and constipation.  On dulcolax.  BRBPR noted on toilet paper and on stool which started last wk.  Pt states she did not want her hgb to get low like it did while she was in the hospital.  Pt denies dizziness, palpitations, CP, SOB.  Pt is on blood thinners 2/2 recurrent DVT and PE.  Taking xarelto and plavix.  Pt states she spoke with GI, told to f/u if needed.  That was prior to this episode of bleeding.  Past Medical History:  Diagnosis Date  . Back pain   . DVT (deep venous thrombosis) ([place])   . Hypertension   . Lupus anticoagulant syndrome ([place])   . Pulmonary embolism ([facility])     Allergies  Allergen Reactions  . Prednisone Other (See Comments)    No energy "stalls out".    Makes pt  Jittery.  . Tramadol Other (See Comments)    Constipation,  Makes pt  Jittery.    ROS General: Denies fever, chills, night sweats, changes in weight, changes in appetite HEENT: Denies headaches, ear pain, changes in vision, rhinorrhea, sore throat CV: Denies CP, palpitations, SOB, orthopnea Pulm: Denies SOB, cough, wheezing GI: Denies abdominal pain, nausea, vomiting, diarrhea, constipation  +BRBPR, hemorrhoids GU: Denies dysuria, hematuria, frequency, vaginal discharge Msk: Denies muscle cramps, joint pains Neuro: Denies weakness, numbness, tingling Skin: Denies rashes, bruising Psych: Denies depression, anxiety, hallucinations    Objective:    Blood pressure 128/66, pulse 80, temperature 98 F (36.7 C), temperature source Oral, SpO2 97 %.  Gen. Pleasant, well-nourished, in no distress,  normal affect   HEENT: [place]/AT, face symmetric, conjunctiva clear, no scleral icterus, PERRLA, nares patent without drainage Lungs: no accessory muscle use, CTAB, no wheezes or rales Cardiovascular: RRR, no m/r/g, no peripheral edema Abdomen: BS present, soft, NT/ND Rectal:  Small external hemorrhoid noted not bleeding, otherwise normal anus.  Normal rectal tone, no masses appreciated, no stool in rectal vault, dark fluid noted on glove.  Guaiac positive.  Chaperone present: Kelly Splinter, RN Neuro:  A&Ox3, CN II-XII intact, normal gait Skin:  Warm, no lesions/ rash  Wt Readings from Last 3 Encounters:  10/06/18 152 lb (68.9 kg)  10/01/18 150 lb 9.2 oz (68.3 kg)  09/24/18 154 lb (69.9 kg)    Lab Results  Component Value Date   WBC 9.5 10/06/2018   HGB 11.7 (L) 10/06/2018   HCT 35.7 (L) 10/06/2018   PLT 529.0 (H) 10/06/2018   GLUCOSE 89 10/06/2018   CHOL 308 (H) 09/13/2018   TRIG 371 (H) 09/13/2018   HDL 32 (L) 09/13/2018   LDLCALC 202 (H) 09/13/2018   ALT 13 09/29/2018   AST 13 (L) 09/29/2018   NA 139 10/06/2018   K 4.9 10/06/2018   CL 104 10/06/2018   CREATININE 0.79 10/06/2018   BUN 15 10/06/2018   CO2 26 10/06/2018   TSH 1.180 01/22/2016   INR 1.17 09/29/2018    Assessment/Plan:  Hemorrhoids, unspecified hemorrhoid type  -small external hemorrhoid noted -discussed increasing po intake of  water.   -continue dulcolax.  Consider miralax in addition. - Plan: Ambulatory referral to Gastroenterology  Acute lower GI bleeding  -will obtain CBC to check H/H -based on results will proceed with GI f/u vs ED  - Plan: CBC (no diff), Ambulatory referral to Gastroenterology -pt given RTC or ED precautions  F/u prn   Grier Mitts, MD

## 2018-10-12 LAB — CBC
HCT: 31.9 % — ABNORMAL LOW (ref 36.0–46.0)
Hemoglobin: 10.7 g/dL — ABNORMAL LOW (ref 12.0–15.0)
MCHC: 33.5 g/dL (ref 30.0–36.0)
MCV: 89.8 fl (ref 78.0–100.0)
Platelets: 498 10*3/uL — ABNORMAL HIGH (ref 150.0–400.0)
RBC: 3.55 Mil/uL — ABNORMAL LOW (ref 3.87–5.11)
RDW: 14.6 % (ref 11.5–15.5)
WBC: 9.3 10*3/uL (ref 4.0–10.5)

## 2018-10-13 ENCOUNTER — Telehealth: Payer: Self-pay | Admitting: Family Medicine

## 2018-10-13 NOTE — Telephone Encounter (Signed)
Spoke to patient. She wanted to know if  medication could be called in for her to take --she states she will have to drive 35 miles for appointment and it will cost her $75 dollars   patient states she talked  Her friends and they had difficult with medication and she wanted to know about fish oil. RN informed patient that fish oil is not enough and crestor was the least like medication that will give you issues. Patient states she has not had any issue since stopping 4 to 5 days ago

## 2018-10-13 NOTE — Telephone Encounter (Signed)
Patient called for her results- patient states she has not had anymore bleeding. She has been scheduled for 3/31 with GI- and she was reminded to make sure she is on the cancellation list. Patient states she is not having any symptoms at this time- and she will notify if she has renewed bleeding, dizziness,weakness, abdominal pain.  Notes recorded by Billie Ruddy, MD on 10/12/2018 at 3:12 PM EST Your hgb is 10.7 now. It was 11.7 last wk. Please inquire if pt has been able to get in contact with GI in regards to an appt and if she has noticed any more bleeding. Pt should be reminded of RTC or ED precautions for worsening symptoms.

## 2018-10-13 NOTE — Telephone Encounter (Unsigned)
Copied from [place] [id number]. Topic: General - Other >> Oct 13, 2018  8:06 AM Rayann Heman wrote: Reason for CRM: pt calling to check status of lab results done on 10/11/18.  Please advise

## 2018-10-14 ENCOUNTER — Other Ambulatory Visit: Payer: Self-pay | Admitting: *Deleted

## 2018-10-14 ENCOUNTER — Encounter: Payer: Self-pay | Admitting: Family Medicine

## 2018-10-14 DIAGNOSIS — E785 Hyperlipidemia, unspecified: Secondary | ICD-10-CM

## 2018-10-14 MED ORDER — PITAVASTATIN CALCIUM 4 MG PO TABS: 4.0000 mg | ORAL_TABLET | Freq: Every day | ORAL | 1 refills | Status: DC

## 2018-10-14 NOTE — Telephone Encounter (Signed)
Every patient reacts different to each medication. Her friend is no able to tolerate Crestor, but we have patients taking Crestor 40mg  daily without problems. I can (and will) send a prescription for Livalo 4mg  to the pharmacy as recommended by DR Ellyn Hack, and re-assess fasting blood work in 1 month.   Patient should remember we are trying to prevent need for additional stent placement, decrease potential for heart attack , etc. She can discussed it with her PCP if desired, but best next option is to come to see the pharmacist at lipid clinic.  During her appointment we will be able to access previous experience with cholesterol management and offer available options.    Fish oil will NOT improve her bad cholesterol (LDL) but improves her triglycerides (as stated by RN).

## 2018-10-14 NOTE — Telephone Encounter (Signed)
Noted  

## 2018-10-15 NOTE — Telephone Encounter (Signed)
Per pt call - stated the [facility] 4mg  is to expensive ($300) for her ins will not cover this.  The pharm will call this office back for another medication.  [facility] [employer] [facility].  Pt would like a call back when done please.

## 2018-10-15 NOTE — Telephone Encounter (Signed)
LMOM; patient is to call back and discuss alternative for lipid management.   Noted: 1. Intolerant to rosuvastatin 2. Unable to afford Livalo 3. Refuses visit with Lipid Clinic

## 2018-10-16 ENCOUNTER — Other Ambulatory Visit: Payer: Self-pay | Admitting: Family

## 2018-10-16 DIAGNOSIS — I10 Essential (primary) hypertension: Secondary | ICD-10-CM

## 2018-10-18 ENCOUNTER — Telehealth: Payer: Self-pay | Admitting: Pharmacist Clinician (PhC)/ Clinical Pharmacy Specialist

## 2018-10-18 NOTE — Telephone Encounter (Signed)
New Message  Patient states she wants to talk about medication she was suppose to be prescribed.

## 2018-10-19 NOTE — Telephone Encounter (Signed)
Pt states she has not had anymore bleeding. She has been scheduled for 3/31 with GI- and she was reminded to make sure she is on the cancellation list. Patient states she is not having any symptoms at this time- and she will notify if she has renewed bleeding, dizziness,weakness, abdominal pain.

## 2018-10-20 NOTE — Telephone Encounter (Signed)
Returned patient call.  Her insurance would not cover Livalo and she now wonders if her feelings of fatigue had more to do with her overall health than just rosuvastatin.  It was recommended that she have a lipid appointment with [facility], however her insurance has a $150 copay for any specialty appointments and she cannot afford that [employer]  plan).   She was also asking if fish oil might help instead of the rosuvastatin.  Had a long discussion over the phone.  Patient willing to rechallenge with rosuvastatin.  Will start at 10 mg qod  3-4 doses then go to daily if no change.  She can use OTC fish oli, although it is not nearly as potent, but she recently bought some [employer] product, so I advised she take 2 capsules bid.  We also discussed the difference in treating triglycerides vs LDL.   Discussed cutting back on white foods and looking to more whole grain products where she can.  Also encouraged eating oatmeal several times each week.   Her LDL is > 200 and she will most likely need a PCSK9 inhibitor, so I have made a note for when she comes in to see Dr. Ellyn Hack in May and we can discuss it at that time.  She is < 65, however with the [employer] insurance will have to determine if she can use a copay card.

## 2018-10-25 ENCOUNTER — Telehealth: Payer: Self-pay | Admitting: Family Medicine

## 2018-10-25 NOTE — Telephone Encounter (Signed)
Please Advise

## 2018-10-25 NOTE — Telephone Encounter (Signed)
Copied from [place] [id number]. Topic: Quick Communication - Rx Refill/Question >> Oct 25, 2018  3:28 PM Margot Ables wrote: Medication: Pt called regarding her medications.  Pt said that this morning she forgot to take Coreg in the morning and she feels good. She took lisinopril, Plavix, and pantoprazole this morning.  When she takes all 4 about 30 minutes later she feels tired/sluggish and has no energy. Since she has been taking all 3 BP medications prescribed her BP is lower than ever 120s/60 and HR in the 80s and she feels bad. Pt asking if she can d/c the morning dose of Coreg and just take it at night.

## 2018-10-25 NOTE — Telephone Encounter (Signed)
Please review with cardiology as they are the ones prescribing this.

## 2018-10-26 NOTE — Telephone Encounter (Signed)
Called pt left a detailed message to contact her cardiology office regarding her concerns with her BP medications since they are prescribing the medications

## 2018-11-05 ENCOUNTER — Telehealth: Payer: Self-pay | Admitting: Cardiology

## 2018-11-05 NOTE — Telephone Encounter (Signed)
Spoke with patient and she stated that she that she is having bad fatigue and read on information could be caused by Plavix. Everyone she has talked to has had complications from Plavix. Did explain not a usual symptom but she insisted it was [facility]. She denies any dark stools or visible bleeding. Per patient her blood counts are good now. Advised patient would forward to Dr Ellyn Hack for review

## 2018-11-05 NOTE — Telephone Encounter (Signed)
° ° °  Please return call to patient °

## 2018-11-05 NOTE — Telephone Encounter (Signed)
New Message:   Pt says she have absolutely no energy. She thinks it might be the Plavix that is making her feel this way,

## 2018-11-05 NOTE — Telephone Encounter (Signed)
Lm to call back ./cy 

## 2018-11-09 NOTE — Telephone Encounter (Signed)
Fatigue associated with Plavix is usually related to anemia & not a usual side effect.  With recent stent - we need to continue Plavix for at least 6 months (preferably 1 yr).  More than likely fatigue is related to something else.  Glenetta Hew, MD

## 2018-11-10 NOTE — Telephone Encounter (Signed)
Advised patient, verbalized understanding  

## 2018-11-11 ENCOUNTER — Ambulatory Visit: Payer: BLUE CROSS/BLUE SHIELD | Admitting: Family Medicine

## 2018-11-11 ENCOUNTER — Other Ambulatory Visit: Payer: Self-pay

## 2018-11-11 ENCOUNTER — Other Ambulatory Visit (INDEPENDENT_AMBULATORY_CARE_PROVIDER_SITE_OTHER): Payer: BLUE CROSS/BLUE SHIELD

## 2018-11-11 DIAGNOSIS — E611 Iron deficiency: Secondary | ICD-10-CM

## 2018-11-11 LAB — CBC WITH DIFFERENTIAL/PLATELET
Basophils Absolute: 0.1 10*3/uL (ref 0.0–0.1)
Basophils Relative: 0.7 % (ref 0.0–3.0)
Eosinophils Absolute: 0.2 10*3/uL (ref 0.0–0.7)
Eosinophils Relative: 1.6 % (ref 0.0–5.0)
HCT: 30.7 % — ABNORMAL LOW (ref 36.0–46.0)
Hemoglobin: 10.3 g/dL — ABNORMAL LOW (ref 12.0–15.0)
Lymphocytes Relative: 28.4 % (ref 12.0–46.0)
Lymphs Abs: 2.9 10*3/uL (ref 0.7–4.0)
MCHC: 33.6 g/dL (ref 30.0–36.0)
MCV: 86.1 fl (ref 78.0–100.0)
Monocytes Absolute: 0.5 10*3/uL (ref 0.1–1.0)
Monocytes Relative: 5.3 % (ref 3.0–12.0)
Neutro Abs: 6.4 10*3/uL (ref 1.4–7.7)
Neutrophils Relative %: 64 % (ref 43.0–77.0)
Platelets: 557 10*3/uL — ABNORMAL HIGH (ref 150.0–400.0)
RBC: 3.56 Mil/uL — ABNORMAL LOW (ref 3.87–5.11)
RDW: 13.9 % (ref 11.5–15.5)
WBC: 10.1 10*3/uL (ref 4.0–10.5)

## 2018-11-15 ENCOUNTER — Other Ambulatory Visit: Payer: Self-pay

## 2018-11-15 DIAGNOSIS — D649 Anemia, unspecified: Secondary | ICD-10-CM

## 2018-11-16 ENCOUNTER — Ambulatory Visit: Payer: BLUE CROSS/BLUE SHIELD | Admitting: Gastroenterology

## 2018-11-16 ENCOUNTER — Telehealth: Payer: Self-pay | Admitting: Gastroenterology

## 2018-11-16 NOTE — Telephone Encounter (Signed)
Pt called back and wanted to speak back with you following up on the conversation that you guys discuss yesterday. And also possibly setting up an iron infusion.

## 2018-11-16 NOTE — Telephone Encounter (Signed)
Left message for pt. To call back

## 2018-11-16 NOTE — Telephone Encounter (Signed)
Spoke with pt and let her know Dr. Havery Moros is going to check her labs in 2 weeks and go from there. Pt had thought one of the hospitalist mentioned perhaps IV iron would help. She will wait and see what next labs show.

## 2018-11-18 ENCOUNTER — Telehealth: Payer: Self-pay | Admitting: *Deleted

## 2018-11-18 NOTE — Telephone Encounter (Signed)
Per result note sent from GI, pt was advised to f/u with GI via webex, however she did not feel comfortable doing this.  Labs were ordered by GI, but pt wished to have them done at PCP office.  It seems pt's hgb is low, possibly 2/2 pt's use of blood thinners.  Pt has a h/o DVT, PE, and a stent in place for which she should remain on the blood thinners (6 mo-62yr for plavix) and the other indefinitely.  If hgb continues to remain low GI may need to do EGD/colonoscopy to evaluate for a possible bleed.  Pt should schedule a lab appointment (CBC and lipid panel orders in computer).  She should also f/u with Cardiology and GI.  Please advise pt a phone visit could be done if she does not want to use webex.

## 2018-11-18 NOTE — Telephone Encounter (Signed)
Copied from [place] [phone]. Topic: General - Other >> Nov 18, 2018  1:16 PM Wynetta Emery, [place] C wrote: Reason for CRM: pt called in to be advised. Pt says that she was told by GI that she would receive a call from PCP office in regards to recent lab results. Pt says that she was told that she would have to repeat in PCP office instead of GI.  Pt says that she hasnt heard anything further from PCP and would like to be advised.

## 2018-11-19 NOTE — Telephone Encounter (Signed)
Spoke with pt states that her cardiology advised her to have a repeat for her labs, Pt is scheduled for lab on 11/25/2018

## 2018-11-25 ENCOUNTER — Other Ambulatory Visit: Payer: Self-pay

## 2018-11-25 ENCOUNTER — Other Ambulatory Visit: Payer: Self-pay | Admitting: Family Medicine

## 2018-11-25 ENCOUNTER — Other Ambulatory Visit (INDEPENDENT_AMBULATORY_CARE_PROVIDER_SITE_OTHER): Payer: BLUE CROSS/BLUE SHIELD

## 2018-11-25 DIAGNOSIS — E785 Hyperlipidemia, unspecified: Secondary | ICD-10-CM

## 2018-11-25 DIAGNOSIS — K922 Gastrointestinal hemorrhage, unspecified: Secondary | ICD-10-CM

## 2018-11-25 LAB — LIPID PANEL
Cholesterol: 266 mg/dL — ABNORMAL HIGH (ref 0–200)
HDL: 33.8 mg/dL — ABNORMAL LOW (ref >39.00)
Total CHOL/HDL Ratio: 8
Triglycerides: 410 mg/dL — ABNORMAL HIGH (ref 0.0–149.0)

## 2018-11-25 LAB — CBC WITH DIFFERENTIAL/PLATELET
Basophils Absolute: 0.1 10*3/uL (ref 0.0–0.1)
Basophils Relative: 0.6 % (ref 0.0–3.0)
Eosinophils Absolute: 0.2 10*3/uL (ref 0.0–0.7)
Eosinophils Relative: 1.8 % (ref 0.0–5.0)
HCT: 30.7 % — ABNORMAL LOW (ref 36.0–46.0)
Hemoglobin: 10.2 g/dL — ABNORMAL LOW (ref 12.0–15.0)
Lymphocytes Relative: 26.7 % (ref 12.0–46.0)
Lymphs Abs: 2.6 10*3/uL (ref 0.7–4.0)
MCHC: 33.1 g/dL (ref 30.0–36.0)
MCV: 85.4 fl (ref 78.0–100.0)
Monocytes Absolute: 0.7 10*3/uL (ref 0.1–1.0)
Monocytes Relative: 7.5 % (ref 3.0–12.0)
Neutro Abs: 6.3 10*3/uL (ref 1.4–7.7)
Neutrophils Relative %: 63.4 % (ref 43.0–77.0)
Platelets: 568 10*3/uL — ABNORMAL HIGH (ref 150.0–400.0)
RBC: 3.6 Mil/uL — ABNORMAL LOW (ref 3.87–5.11)
RDW: 14.4 % (ref 11.5–15.5)
WBC: 9.9 10*3/uL (ref 4.0–10.5)

## 2018-11-25 LAB — LDL CHOLESTEROL, DIRECT: Direct LDL: 141 mg/dL

## 2018-11-30 ENCOUNTER — Telehealth: Payer: Self-pay | Admitting: Gastroenterology

## 2018-11-30 NOTE — Telephone Encounter (Signed)
Patient called, per her PCP request to let us know her Hbg from 11/25/18 was 10.2 (11/15/18 was 10.3) she states she has no blood in stools and it is actually light brown.

## 2018-11-30 NOTE — Telephone Encounter (Signed)
Thanks Sherlynn Stalls, I did hear from her PCP about it. Can you help coordinate a virtual visit with me in the next few weeks for reassessment? Thanks

## 2018-12-01 ENCOUNTER — Telehealth: Payer: Self-pay

## 2018-12-01 NOTE — Telephone Encounter (Signed)
LM for pt to call back to schedule an appt for next week.

## 2018-12-01 NOTE — Telephone Encounter (Signed)
Pt scheduled for Monday, 4-20

## 2018-12-01 NOTE — Telephone Encounter (Signed)
-----   Message from Yetta Flock, MD sent at 11/30/2018  7:43 AM EDT ----- Regarding: follow up Jan can you help reach out to this patient and schedule a follow up virtual visit in the next 1-2 weeks if she is able to? Thanks ----- Message ----- From: Billie Ruddy, MD Sent: 11/29/2018   5:24 PM EDT To: Leonie Man, MD, Yetta Flock, MD, #  Pt still anemic with reactive thrombocytosis likely caused by blood thinners.  Recommend f/u with GI to discussed possible need for repeat EGD.  Triglycerides elevated at 410 which make other lipid calculations invalid.  Would discuss increase in pitavastatin with Cardiology.

## 2018-12-06 ENCOUNTER — Encounter: Payer: Self-pay | Admitting: Gastroenterology

## 2018-12-06 ENCOUNTER — Ambulatory Visit (INDEPENDENT_AMBULATORY_CARE_PROVIDER_SITE_OTHER): Payer: BLUE CROSS/BLUE SHIELD | Admitting: Gastroenterology

## 2018-12-06 ENCOUNTER — Other Ambulatory Visit: Payer: Self-pay

## 2018-12-06 VITALS — Ht 64.5 in | Wt 148.0 lb

## 2018-12-06 DIAGNOSIS — D649 Anemia, unspecified: Secondary | ICD-10-CM

## 2018-12-06 DIAGNOSIS — Z86010 Personal history of colonic polyps: Secondary | ICD-10-CM | POA: Diagnosis not present

## 2018-12-06 DIAGNOSIS — R195 Other fecal abnormalities: Secondary | ICD-10-CM | POA: Diagnosis not present

## 2018-12-06 DIAGNOSIS — Z7902 Long term (current) use of antithrombotics/antiplatelets: Secondary | ICD-10-CM | POA: Diagnosis not present

## 2018-12-06 MED ORDER — FERROUS SULFATE 325 (65 FE) MG PO TABS: 325.0000 mg | ORAL_TABLET | Freq: Two times a day (BID) | ORAL | 3 refills | Status: DC

## 2018-12-06 NOTE — Progress Notes (Signed)
Virtual Visit via Video Note  I connected with [name] [name] on 12/06/18 at  8:30 AM EDT by a video enabled telemedicine application and verified that I am speaking with the correct person using two identifiers.   I discussed the limitations of evaluation and management by telemedicine and the availability of in person appointments. The patient expressed understanding and agreed to proceed.  THIS ENCOUNTER IS A VIRTUAL VISIT DUE TO COVID-19 - PATIENT WAS NOT SEEN IN THE OFFICE. PATIENT HAS CONSENTED TO VIRTUAL VISIT / TELEMEDICINE VISIT USING DOXIMITY   Location of patient: home Location of provider: office Persons participating: myself, patient, husband  HPI :  63 y/o female here for a follow up visit. I met her on Feb 12th when she was admitted and our service was consulted on her case. She has a history of chronic anticoagulation use on Xarelto for history of PE and DVT, also with recent MI and placement of stents in January, was on aspirin and plavix, presented with worsening anemia, stools were heme positive. She has ongoing need for Plavix for 12 months and indefinite plans for Xarelto. During her hospital stay, Xarelto was held but Plavix had to be continued. She underwent EGD, colonoscopy, and then capsule endoscopy to further evaluate, as outlined below:  EGD 09/30/18 - 1 cm hiatal hernia. Normal stomach. Duodenal diverticulum. Normal duodenum otherwise Colonoscopy 09/30/18 - Preparation of the colon was fair, several minutes spent lavaging the colon to achieve adequate views. Normal ileum. 2 AVMs in the descending colon and IC valve - treated with APC. 2 small polyps in the ascending colon not removed. Hemorrhoids.  Capsule endoscopy 10/01/18 - Rapid small bowel transit. 2 small AVMs vs. Red markings in the proximal duodenum. Otherwise normal small bowel. Unclear if this is simply a change from endoscopic trauma given capsule was placed immediately following endoscopy and these  were not seen there.  Hgb 2/19 - 11.7 Hgb 2/24 -  10.7 Hgb 3/26 - 10.3 Hgb 4/9 - 10.2  History of iron deficiency dating back to 2018 while on Xarelto. She reports no blood in the stools. Brown stools, she watches this closely. She is taking pantoprazole 59m once daily in addition to her blood thinners. She feels somewhat fatigued after she takes her medication, unclear if related to Coreg. She does endorse some ongoing fatigue. She has been on Coreg since January. She is not taking oral iron due to concern for causing stools to be dark. She was taking oral iron prior to hospitalization for a few months before. She otherwise feels okay. No abdominal pains.     Past Medical History:  Diagnosis Date  . Back pain   . DVT (deep venous thrombosis) ([facility]   . Hypertension   . Lupus anticoagulant syndrome ([facility]   . Pulmonary embolism ([facility]      Past Surgical History:  Procedure Laterality Date  . BACK SURGERY    . COLONOSCOPY  05/19/2012   Procedure: COLONOSCOPY;  Surgeon: PBeryle Beams MD;  [facility]  Service: Endoscopy;  Laterality: N/A;  . COLONOSCOPY WITH PROPOFOL N/A 09/30/2018   Procedure: COLONOSCOPY WITH PROPOFOL;  Surgeon: AYetta Flock MD;  [facility]  Service: Gastroenterology;  Laterality: N/A;  . CORONARY STENT INTERVENTION N/A 09/14/2018   Procedure: CORONARY STENT INTERVENTION;  Surgeon: JMartinique Peter M, MD;  [facility];  Service: Cardiovascular;  Laterality: N/A;  . ESOPHAGOGASTRODUODENOSCOPY  05/19/2012   Procedure: ESOPHAGOGASTRODUODENOSCOPY (EGD);  Surgeon: PSaralyn Pilar  Renee Ramus, MD;  Location: [place];  Service: Endoscopy;  Laterality: N/A;  . ESOPHAGOGASTRODUODENOSCOPY (EGD) WITH PROPOFOL N/A 09/30/2018   Procedure: ESOPHAGOGASTRODUODENOSCOPY (EGD) WITH PROPOFOL;  Surgeon: Yetta Flock, MD;  Location: [place];  Service: Gastroenterology;  Laterality: N/A;  . GIVENS CAPSULE STUDY N/A 09/30/2018   Procedure:  GIVENS CAPSULE STUDY;  Surgeon: Yetta Flock, MD;  Location: [place];  Service: Gastroenterology;  Laterality: N/A;  . HOT HEMOSTASIS N/A 09/30/2018   Procedure: HOT HEMOSTASIS (ARGON PLASMA COAGULATION/BICAP);  Surgeon: Yetta Flock, MD;  Location: [facility] ENDOSCOPY;  Service: Gastroenterology;  Laterality: N/A;  . INTRAVASCULAR PRESSURE WIRE/FFR STUDY N/A 09/14/2018   Procedure: INTRAVASCULAR PRESSURE WIRE/FFR STUDY;  Surgeon: Martinique, Peter M, MD;  Location: [place] [facility];  Service: Cardiovascular;  Laterality: N/A;  . LEFT HEART CATH AND CORONARY ANGIOGRAPHY N/A 09/13/2018   Procedure: LEFT HEART CATH AND CORONARY ANGIOGRAPHY;  Surgeon: Leonie Man, MD;  Location: [place] [facility];  Service: Cardiovascular;  Laterality: N/A;   Family History  Problem Relation Age of Onset  . Leukemia Mother   . Prostate cancer Father   . Cancer Father    Social History   Tobacco Use  . Smoking status: Current Every Day Smoker    Packs/day: 0.50    Years: 29.00    Pack years: 14.50    Types: Cigarettes  . Smokeless tobacco: Never Used  Substance Use Topics  . Alcohol use: Yes    Comment: Occasional  . Drug use: No   Current Outpatient Medications  Medication Sig Dispense Refill  . clopidogrel (PLAVIX) 75 MG tablet Take 75 mg by mouth daily.    Marland Kitchen acetaminophen (TYLENOL) 500 MG tablet Take 1,000 mg by mouth every 6 (six) hours as needed for mild pain.     . bisacodyl (DULCOLAX) 5 MG EC tablet Take 5 mg by mouth daily as needed for moderate constipation.    . carvedilol (COREG) 6.25 MG tablet Take 1 tablet (6.25 mg total) by mouth 2 (two) times daily with a meal for 30 days. 60 tablet 6  . lisinopril (PRINIVIL,ZESTRIL) 40 MG tablet Take 1 tablet (40 mg total) by mouth daily. 90 tablet 3  . nitroGLYCERIN (NITROSTAT) 0.4 MG SL tablet Place 1 tablet (0.4 mg total) under the tongue every 5 (five) minutes as needed for up to 3 doses for chest pain. 12 tablet 0  . pantoprazole  (PROTONIX) 40 MG tablet Take 1 tablet (40 mg total) by mouth daily. 30 tablet 1  . Pitavastatin Calcium 4 MG TABS Take 1 tablet (4 mg total) by mouth daily. 30 tablet 1  . rivaroxaban (XARELTO) 20 MG TABS tablet Take 1 tablet (20 mg total) by mouth daily. 90 tablet 3   No current facility-administered medications for this visit.    Allergies  Allergen Reactions  . Prednisone Other (See Comments)    No energy "stalls out".    Makes pt  Jittery.  . Tramadol Other (See Comments)    Constipation,  Makes pt  Jittery.  Marland Kitchen Crestor [Rosuvastatin Calcium]     Feels fatigued     Review of Systems: All systems reviewed and negative except where noted in HPI.    No results found.  Physical Exam: NA  ASSESSMENT AND PLAN: 63 y/o female here for reassessment of the following issues:  Anemia / heme positive stool / anticoagulation antiplatelet use - history as above. I suspect her anemia is due to AVMs in the setting  of anticoagulation, nothing else concerning noted on her workup. Unclear if capsule findings just represent contact trauma from recent endoscopy or if there is truly an AVM present, given the small size of it, possible it was missed on EGD. Regardless her Hgb is relatively stable since her hospitalization, a small downtrend, but she is not having any overt bleeding, passing brown stool. Recommend iron supplementation at this point with ferrous sulfate 35m BID. Will repeat CBC in 3 weeks. If further downtrend despite oral iron, may switch to IV iron. If her anemia fails to improve despite these measures, then would [facility]. She can use Miralax PRN for constipation if develops this from the iron. She agreed with the plan.  Colon polyps - 2 small polyps not removed during recent colonoscopy. Once she is off Plavix down the road and able to hold Xarelto, can repeat colonoscopy with polypectomy. She will need to be on plavix at least 6  months per Cardiology, we will await her follow up with them. All questions answered.  SCarolina Cellar MD [facility]  CC: BBillie Ruddy MD

## 2018-12-06 NOTE — Patient Instructions (Addendum)
If you are age 64 or older, your body mass index should be between 23-30. Your Body mass index is 25.01 kg/m. If this is out of the aforementioned range listed, please consider follow up with your Primary Care Provider.  If you are age 55 or younger, your body mass index should be between 19-25. Your Body mass index is 25.01 kg/m. If this is out of the aformentioned range listed, please consider follow up with your Primary Care Provider.   To help prevent the possible spread of infection to our patients, communities, and staff; we will be implementing the following measures:  As of now we are not allowing any visitors/family members to accompany you to any upcoming appointments with [facility]. If you have any concerns about this please contact our office to discuss prior to the appointment.   We have sent the following medications to your pharmacy for you to pick up at your convenience: Ferrous sulfate 325mg : Take twice a day  Take Miralax, as directed, daily  Please go to the lab in the basement of our building to have lab work done the week of May 11th. Hit "B" for basement when you get on the elevator.  When the doors open the lab is on your left.  We will call you with the results  You will be due for a recall colonoscopy in January 2021. We will let you know when it is time to schedule that.  Thank you for entrusting me with your care and for choosing [facility], Dr. Screven Cellar

## 2018-12-15 ENCOUNTER — Telehealth: Payer: Self-pay

## 2018-12-15 NOTE — Telephone Encounter (Signed)
Virtual Visit Pre-Appointment Phone Call  "([name] [name]), I am calling you today to discuss your upcoming appointment. We are currently trying to limit exposure to the virus that causes COVID-19 by seeing patients at home rather than in the office."  1. "What is the BEST phone number to call the day of the visit?" - [phone]  2. "Do you have or have access to (through a family member/friend) a smartphone with video capability that we can use for your visit?" a. If yes - list this number in appt notes as "cell" (if different from BEST phone #) and list the appointment type as a VIDEO visit in appointment notes  3. Confirm consent - "In the setting of the current Covid19 crisis, you are scheduled for a ( video) visit with your provider on (May 13) at (10:00am).  Just as we do with many in-office visits, in order for you to participate in this visit, we must obtain consent.  If you'd like, I can send this to your mychart (if signed up) or email for you to review.  Otherwise, I can obtain your verbal consent now.  All virtual visits are billed to your insurance company just like a normal visit would be.  By agreeing to a virtual visit, we'd like you to understand that the technology does not allow for your provider to perform an examination, and thus may limit your provider's ability to fully assess your condition. If your provider identifies any concerns that need to be evaluated in person, we will make arrangements to do so.  Finally, though the technology is pretty good, we cannot assure that it will always work on either your or our end, and in the setting of a video visit, we may have to convert it to a phone-only visit.  In either situation, we cannot ensure that we have a secure connection.  Are you willing to proceed?" STAFF: Did the patient verbally acknowledge consent to telehealth visit? Document YES/NO here: YES  4. Advise patient to be prepared - "Two hours prior to your appointment,  go ahead and check your blood pressure, pulse, oxygen saturation, and your weight (if you have the equipment to check those) and write them all down. When your visit starts, your provider will ask you for this information. If you have an Apple Watch or Kardia device, please plan to have heart rate information ready on the day of your appointment. Please have a pen and paper handy nearby the day of the visit as well."  5. Give patient instructions for MyChart download to smartphone OR Doximity/Doxy.me as below if video visit (depending on what platform provider is using)  6. Inform patient they will receive a phone call 15 minutes prior to their appointment time (may be from unknown caller ID) so they should be prepared to answer    TELEPHONE CALL NOTE  Shawnette Augello has been deemed a candidate for a follow-up tele-health visit to limit community exposure during the Covid-19 pandemic. I spoke with the patient via phone to ensure availability of phone/video source, confirm preferred email & phone number, and discuss instructions and expectations.  I reminded Fredericka Bottcher to be prepared with any vital sign and/or heart rhythm information that could potentially be obtained via home monitoring, at the time of her visit. I reminded Albie Bazin to expect a phone call prior to her visit.  Rhetta Mura, [place] 12/15/2018 3:52 PM   INSTRUCTIONS FOR DOWNLOADING THE [place] APP  TO SMARTPHONE  - The patient must first make sure to have activated MyChart and know their login information - If Apple, go to [employer] and type in MyChart in the search bar and download the app. If Android, ask patient to go to [employer] and type in [place] in the search bar and download the app. The app is free but as with any other app downloads, their phone may require them to verify saved payment information or Apple/Android password.  - The patient will need to then log into the app with their  MyChart username and password, and select  as their healthcare provider to link the account. When it is time for your visit, go to the MyChart app, find appointments, and click Begin Video Visit. Be sure to Select Allow for your device to access the Microphone and Camera for your visit. You will then be connected, and your provider will be with you shortly.  **If they have any issues connecting, or need assistance please contact MyChart service desk [phone])**  **If using a computer, in order to ensure the best quality for their visit they will need to use either of the following Internet Browsers: [employer], or Google Chrome**  IF USING DOXIMITY or DOXY.ME - The patient will receive a link just prior to their visit by text.     FULL LENGTH CONSENT FOR TELE-HEALTH VISIT   I hereby voluntarily request, consent and authorize [facility] and its employed or contracted physicians, physician assistants, nurse practitioners or other licensed health care professionals (the Practitioner), to provide me with telemedicine health care services (the "Services") as deemed necessary by the treating Practitioner. I acknowledge and consent to receive the Services by the Practitioner via telemedicine. I understand that the telemedicine visit will involve communicating with the Practitioner through live audiovisual communication technology and the disclosure of certain medical information by electronic transmission. I acknowledge that I have been given the opportunity to request an in-person assessment or other available alternative prior to the telemedicine visit and am voluntarily participating in the telemedicine visit.  I understand that I have the right to withhold or withdraw my consent to the use of telemedicine in the course of my care at any time, without affecting my right to future care or treatment, and that the Practitioner or I may terminate the telemedicine visit at  any time. I understand that I have the right to inspect all information obtained and/or recorded in the course of the telemedicine visit and may receive copies of available information for a reasonable fee.  I understand that some of the potential risks of receiving the Services via telemedicine include:  Marland Kitchen Delay or interruption in medical evaluation due to technological equipment failure or disruption; . Information transmitted may not be sufficient (e.g. poor resolution of images) to allow for appropriate medical decision making by the Practitioner; and/or  . In rare instances, security protocols could fail, causing a breach of personal health information.  Furthermore, I acknowledge that it is my responsibility to provide information about my medical history, conditions and care that is complete and accurate to the best of my ability. I acknowledge that Practitioner's advice, recommendations, and/or decision may be based on factors not within their control, such as incomplete or inaccurate data provided by me or distortions of diagnostic images or specimens that may result from electronic transmissions. I understand that the practice of medicine is not an exact science and that Practitioner makes no warranties  or guarantees regarding treatment outcomes. I acknowledge that I will receive a copy of this consent concurrently upon execution via email to the email address I last provided but may also request a printed copy by calling the office of [facility].    I understand that my insurance will be billed for this visit.   I have read or had this consent read to me. . I understand the contents of this consent, which adequately explains the benefits and risks of the Services being provided via telemedicine.  . I have been provided ample opportunity to ask questions regarding this consent and the Services and have had my questions answered to my satisfaction. . I give my informed consent for the  services to be provided through the use of telemedicine in my medical care  By participating in this telemedicine visit I agree to the above.

## 2018-12-24 ENCOUNTER — Other Ambulatory Visit (INDEPENDENT_AMBULATORY_CARE_PROVIDER_SITE_OTHER): Payer: BLUE CROSS/BLUE SHIELD

## 2018-12-24 DIAGNOSIS — D649 Anemia, unspecified: Secondary | ICD-10-CM

## 2018-12-24 DIAGNOSIS — R195 Other fecal abnormalities: Secondary | ICD-10-CM

## 2018-12-24 DIAGNOSIS — Z7902 Long term (current) use of antithrombotics/antiplatelets: Secondary | ICD-10-CM

## 2018-12-24 LAB — CBC WITH DIFFERENTIAL/PLATELET
Basophils Absolute: 0.1 10*3/uL (ref 0.0–0.1)
Basophils Relative: 0.8 % (ref 0.0–3.0)
Eosinophils Absolute: 0.2 10*3/uL (ref 0.0–0.7)
Eosinophils Relative: 2.7 % (ref 0.0–5.0)
HCT: 34.9 % — ABNORMAL LOW (ref 36.0–46.0)
Hemoglobin: 11.7 g/dL — ABNORMAL LOW (ref 12.0–15.0)
Lymphocytes Relative: 27 % (ref 12.0–46.0)
Lymphs Abs: 2.4 10*3/uL (ref 0.7–4.0)
MCHC: 33.5 g/dL (ref 30.0–36.0)
MCV: 85.6 fl (ref 78.0–100.0)
Monocytes Absolute: 0.6 10*3/uL (ref 0.1–1.0)
Monocytes Relative: 7.1 % (ref 3.0–12.0)
Neutro Abs: 5.6 10*3/uL (ref 1.4–7.7)
Neutrophils Relative %: 62.4 % (ref 43.0–77.0)
Platelets: 458 10*3/uL — ABNORMAL HIGH (ref 150.0–400.0)
RBC: 4.08 Mil/uL (ref 3.87–5.11)
RDW: 18.1 % — ABNORMAL HIGH (ref 11.5–15.5)
WBC: 9 10*3/uL (ref 4.0–10.5)

## 2018-12-28 ENCOUNTER — Telehealth: Payer: Self-pay | Admitting: Cardiology

## 2018-12-29 ENCOUNTER — Telehealth (INDEPENDENT_AMBULATORY_CARE_PROVIDER_SITE_OTHER): Payer: BLUE CROSS/BLUE SHIELD | Admitting: Cardiology

## 2018-12-29 ENCOUNTER — Other Ambulatory Visit: Payer: Self-pay

## 2018-12-29 ENCOUNTER — Encounter: Payer: Self-pay | Admitting: Cardiology

## 2018-12-29 ENCOUNTER — Telehealth: Payer: Self-pay | Admitting: *Deleted

## 2018-12-29 VITALS — BP 161/69 | HR 88 | Ht 64.5 in | Wt 155.0 lb

## 2018-12-29 DIAGNOSIS — D6859 Other primary thrombophilia: Secondary | ICD-10-CM

## 2018-12-29 DIAGNOSIS — Z720 Tobacco use: Secondary | ICD-10-CM

## 2018-12-29 DIAGNOSIS — E611 Iron deficiency: Secondary | ICD-10-CM

## 2018-12-29 DIAGNOSIS — E785 Hyperlipidemia, unspecified: Secondary | ICD-10-CM | POA: Diagnosis not present

## 2018-12-29 DIAGNOSIS — Z7902 Long term (current) use of antithrombotics/antiplatelets: Secondary | ICD-10-CM

## 2018-12-29 DIAGNOSIS — Z9861 Coronary angioplasty status: Secondary | ICD-10-CM

## 2018-12-29 DIAGNOSIS — I2510 Atherosclerotic heart disease of native coronary artery without angina pectoris: Secondary | ICD-10-CM

## 2018-12-29 DIAGNOSIS — I10 Essential (primary) hypertension: Secondary | ICD-10-CM

## 2018-12-29 DIAGNOSIS — E781 Pure hyperglyceridemia: Secondary | ICD-10-CM

## 2018-12-29 MED ORDER — CARVEDILOL 6.25 MG PO TABS: 3.2500 mg | ORAL_TABLET | Freq: Two times a day (BID) | ORAL | 6 refills | Status: DC

## 2018-12-29 MED ORDER — HYDROCHLOROTHIAZIDE 25 MG PO TABS: 25.0000 mg | ORAL_TABLET | Freq: Every day | ORAL | 3 refills | Status: DC

## 2018-12-29 MED ORDER — ICOSAPENT ETHYL 1 G PO CAPS: 1.0000 g | ORAL_CAPSULE | Freq: Two times a day (BID) | ORAL | 11 refills | Status: DC

## 2018-12-29 NOTE — Patient Instructions (Addendum)
Medication Instructions:   Removed Crestor medication list, listed as intolerance  Start Vascepa 1 g twice daily  Reduce carvedilol to 1/2 tablet twice daily  Change Plavix dosage time to evening  Start HCTZ 25 mg daily  If you need a refill on your cardiac medications before your next appointment, please call your pharmacy.   Lab work:  CMP with magnesium next week  Fasting lipid panel, CMP in 2 months- will mail labslip to you closer to the time.  If you have labs (blood work) drawn today and your tests are completely normal, you will receive your results only by: Marland Kitchen MyChart Message (if you have MyChart) OR . A paper copy in the mail If you have any lab test that is abnormal or we need to change your treatment, we will call you to review the results.  Testing/Procedures: none  Follow-Up: At [facility], you and your health needs are our priority.  As part of our continuing mission to provide you with exceptional heart care, we have created designated Provider Care Teams.  These Care Teams include your primary Cardiologist (physician) and Advanced Practice Providers (APPs -  Physician Assistants and Nurse Practitioners) who all work together to provide you with the care you need, when you need it. . Follow-up appointment with CV RR (ASAP)- discuss lipids . You will need a follow up appointment in   ~4 months- Sept  2020.  Please call our office 2 months in advance to schedule this appointment.  You may see Glenetta Hew, MD or one of the following Advanced Practice Providers on your designated Care Team:   . Rosaria Ferries, PA-C . Jory Sims, DNP, ANP  Any Other Special Instructions Will Be Listed Below (If Applicable).

## 2018-12-29 NOTE — Progress Notes (Signed)
Virtual Visit via Video Note   This visit type was conducted due to national recommendations for restrictions regarding the COVID-19 Pandemic (e.g. social distancing) in an effort to limit this patient's exposure and mitigate transmission in our community.  Due to her co-morbid illnesses, this patient is at least at moderate risk for complications without adequate follow up.  This format is felt to be most appropriate for this patient at this time.  All issues noted in this document were discussed and addressed.  A limited physical exam was performed with this format.  Please refer to the patient's chart for her consent to telehealth for [facility].   Patient has given verbal permission to conduct this visit via virtual appointment and to bill insurance 12/15/2018 4:02 PM     Evaluation Performed:  Follow-up visit  Date:  12/29/2018   ID:  [name] [name], DOB Apr 12, 1956, MRN [id number]  Patient Location: Home Provider Location: Home  PCP:  Billie Ruddy, MD  Cardiologist:  Glenetta Hew, MD  Electrophysiologist:  None   Chief Complaint: Hospital follow-up  History of Present Illness:    [name] [name] is a 63 y.o. female with PMH notable for recent NSTEMI w/ CAD-PCIx2 (DES staged PCI Cx & LAD, ~70% RCA to be treated medically) as well as recurrent DVT/PE secondary to Lupus Anticoagulant (spinal fusion surgery in 9798) now complicated by recent GI bleed who presents via audio/video conferencing for a telehealth visit today.   January 23: ER visit chest pain -> went to the ER, but was told no beds available for transfer to [facility] - > transfer cancelled on 1/24, so she was discharged home, but had recurrence of pain again on January 26  January 26 -> prolonged episode of pain, initial troponin 0 0.27 - NSTEMI.  Transferred to [facility] and was chest pain-free upon arrival.  1/27: Cath showed multivessel CAD: 99% p-mCx, 55% m Cx, pLAD 35% @ D1 & 80% @ SP1), pRCA 70%  --> PCI decision was initially delayed in order to discuss the possibility of CABG versus PCI (with concern of need for long-term anticoagulation)  Patient and family decided against CABG and opted for PCI  1/28: PCI Cx & LAD  [name] [name] was last seen by Kerin Ransom on February 7 for hospital follow-up.  She was noting myalgias on 40 mg Crestor therefore was told to reduce to 10 mg.  Thought was that she probably would need PCSK9. ->  Was continued on DOAC (Xarelto) as well as aspirin Plavix.  Negin was recently discharged from the hospital on February 10 for GI bleed (hemoglobin at 8.7).  We made the decision to stop aspirin and using Plavix alone, and Xarelto was held until February 15   --> 2 units PRBC given. --> Plan was for her to have IV iron infusion scheduled by PCP.  --EGD colonoscopy was negative with history of 2 small colonic AVMs. --Capsule endoscopy performed showed 1 small area with "red markings "in the small bowel that could be either a small AVM versus trauma from the recent EGD.  Have recommended repeating CBC in 3 to 4 weeks. -- last Hgb 11.7 in May 8  Interval History:  [name] [name] mostly notes low energy and fatigue levels.  She was doing okay with exception of some energy issues prior to her GI bleed, since that she really has not gotten back to doing much of anything besides trying to do some walking.  She does gardening  and this feels tired and fatigued during the day.  1 day she missed her Plavix dose and felt better than she had without it.  She has not had any further GI bleed issues. She is not having chest pain pressure or really any dyspnea just the fatigue. Has been bothered by allergies.  Has dark stools because of iron supplementation, but not melena.  Cardiovascular ROS: no chest pain or dyspnea on exertion positive for - reduced Energy negative for - edema, irregular heartbeat, loss of consciousness, orthopnea, palpitations, paroxysmal nocturnal dyspnea,  rapid heart rate or shortness of breath; syncope/near syncope, TIA/amaurosis fugax. Melena, hematochezia, hematuria, epistaxis.   The patient does not have symptoms concerning for COVID-19 infection (fever, chills, cough, or new shortness of breath).  The patient is practicing social distancing.  ROS:  Please see the history of present illness.    Review of Systems  Constitutional: Positive for malaise/fatigue.  HENT: Positive for congestion. Negative for nosebleeds.   Respiratory: Negative for shortness of breath.   Cardiovascular: Negative.   Gastrointestinal: Negative for blood in stool, diarrhea, heartburn and melena.       Dark stool 2/2 FeSo4  Genitourinary: Negative for hematuria.  Musculoskeletal: Negative for falls, joint pain and myalgias.  Neurological: Positive for weakness (generalized). Negative for dizziness and focal weakness.  Endo/Heme/Allergies: Positive for environmental allergies (pollen).    Past Medical History:  Diagnosis Date  . Back pain   . CAD S/P percutaneous coronary angioplasty 09/13/2018   09/13/2018 - Cath & Staged DES PCI on 09/14/2018: DES PCI: Cx99%&55% - Resolute Onyx DES 3.5 x 30 (3.6 mm), p-mLAD (prox of D1 almost to D2) - Resolute Onyx DES 3.0 x 22 (3.3 mm); DFR on pRCA 70% - 0.99, Not significant -> Rec Med Rx.  . DVT (deep venous thrombosis) ([place])   . Heart attack ([place]) 09/09/2018  . Hypertension   . Lupus anticoagulant syndrome ([place])   . NSTEMI (non-ST elevated myocardial infarction) ([place]) 09/12/2018   Initial presentation with chest pain was on 09/09/2018, recurrent pain on 1/26 2020 with positive troponins.  Cath showed three-vessel CAD -> declined CABG, opted for two-vessel DES PCI (LAD and CX, negative DFR RCA)  . Pulmonary embolism [facility])    Past Surgical History:  Procedure Laterality Date  . BACK SURGERY    . COLONOSCOPY  05/19/2012   Procedure: COLONOSCOPY;  Surgeon: Beryle Beams, MD;  Location: [place];  Service: Endoscopy;   Laterality: N/A;  . COLONOSCOPY WITH PROPOFOL N/A 09/30/2018   Procedure: COLONOSCOPY WITH PROPOFOL;  Surgeon: Yetta Flock, MD;  Location: [place];  Service: Gastroenterology;  Laterality: N/A;  . CORONARY STENT INTERVENTION N/A 09/14/2018   Procedure: CORONARY STENT INTERVENTION;  Surgeon: Martinique, Peter M, MD;  Location: [place] CV LAB;  Service: Cardiovascular: September 14, 2018- DES PCI: Cx99%&55% - Resolute Onyx DES 3.5 x 30 (3.6 mm), p-mLAD (prox of D1 almost to D2) - Resolute Onyx DES 3.0 x 22 (3.3 mm); DFR on pRCA 70% - 0.99, Not significant -> Rec Med Rx.  . ESOPHAGOGASTRODUODENOSCOPY  05/19/2012   Procedure: ESOPHAGOGASTRODUODENOSCOPY (EGD);  Surgeon: Beryle Beams, MD;  Location: [facility] ENDOSCOPY;  Service: Endoscopy;  Laterality: N/A;  . ESOPHAGOGASTRODUODENOSCOPY (EGD) WITH PROPOFOL N/A 09/30/2018   Procedure: ESOPHAGOGASTRODUODENOSCOPY (EGD) WITH PROPOFOL;  Surgeon: Yetta Flock, MD;  Location: [place];  Service: Gastroenterology;  Laterality: N/A;  . GIVENS CAPSULE STUDY N/A 09/30/2018   Procedure: GIVENS CAPSULE STUDY;  Surgeon: Yetta Flock, MD;  Location: [facility] ENDOSCOPY;  Service: Gastroenterology;  Laterality: N/A;  . HOT HEMOSTASIS N/A 09/30/2018   Procedure: HOT HEMOSTASIS (ARGON PLASMA COAGULATION/BICAP);  Surgeon: Yetta Flock, MD;  Location: [facility] ENDOSCOPY;  Service: Gastroenterology;  Laterality: N/A;  . INTRAVASCULAR PRESSURE WIRE/FFR STUDY N/A 09/14/2018   Procedure: INTRAVASCULAR PRESSURE WIRE/FFR STUDY;  Surgeon: Martinique, Peter M, MD;  Location: [place] CV [facility];  Service: Cardiovascular;  Laterality: RCA: DFR on pRCA 70% - 0.99, Not significant -> Rec Med Rx.  Marland Kitchen LEFT HEART CATH AND CORONARY ANGIOGRAPHY N/A 09/13/2018   Procedure: LEFT HEART CATH AND CORONARY ANGIOGRAPHY;  Surgeon: Leonie Man, MD;  Location: [place] CV LAB;  Service: Cardiovascular:: CULPRIT LESION 99% p-mCx followed by 55%mCx; pLAD 35% A D6fllowed by long 80%  lesion @ SP1); pRCA 70%. Normal LVEDP. Global HK EF ~45%.  -Decision on PCI deferred until discussion about PCI versus CABG.  Concern because of long-term DOAC.  .Marland KitchenTRANSTHORACIC ECHOCARDIOGRAM  09/13/2018   Mildly reduced EF 45 to 50% with diffuse HK.  GR 1 DD.  Mild aortic valve calcification.  MAC.  Moderate pulmonic regurgitation.     Current Meds  Medication Sig  . acetaminophen (TYLENOL) 500 MG tablet Take 1,000 mg by mouth every 6 (six) hours as needed for mild pain.   . bisacodyl (DULCOLAX) 5 MG EC tablet Take 5 mg by mouth daily as needed for moderate constipation.  . clopidogrel (PLAVIX) 75 MG tablet Take 75 mg by mouth daily.  . ferrous sulfate 325 (65 FE) MG tablet Take 1 tablet (325 mg total) by mouth 2 (two) times daily.  .Marland Kitchenlisinopril (PRINIVIL,ZESTRIL) 40 MG tablet Take 1 tablet (40 mg total) by mouth daily.  . nitroGLYCERIN (NITROSTAT) 0.4 MG SL tablet Place 1 tablet (0.4 mg total) under the tongue every 5 (five) minutes as needed for up to 3 doses for chest pain.  . pantoprazole (PROTONIX) 40 MG tablet Take 1 tablet (40 mg total) by mouth daily.  . rivaroxaban (XARELTO) 20 MG TABS tablet Take 1 tablet (20 mg total) by mouth daily.  . [DISCONTINUED] Pitavastatin Calcium 4 MG TABS Take 1 tablet (4 mg total) by mouth daily.  . [DISCONTINUED] polyethylene glycol (MIRALAX / GLYCOLAX) 17 g packet Take 17 g by mouth daily.     Allergies:   Prednisone; Tramadol; and Crestor [rosuvastatin calcium]   Social History   Tobacco Use  . Smoking status: Current Every Day Smoker    Packs/day: 0.50    Years: 29.00    Pack years: 14.50    Types: Cigarettes  . Smokeless tobacco: Never Used  Substance Use Topics  . Alcohol use: Yes    Comment: Occasional  . Drug use: No     Family Hx: The patient's family history includes Cancer in her father and paternal grandfather; Leukemia in her mother and paternal grandmother; Lung cancer in her maternal grandfather; Prostate cancer in her  father; Spina bifida in her son; Stroke in her maternal grandmother. There is no history of Colon cancer or Esophageal cancer.   Prior CV studies:   The following studies were reviewed today:  September 13, 2018-Echo: Mildly reduced EF 45 to 50% with diffuse HK.  GR 1 DD.  Mild aortic valve calcification.  MAC.  Moderate pulmonic regurgitation. . September 13, 2018 - Dx Cath: CULPRIT LESION 99% p-mCx followed by 55%mCx; pLAD 35% A D189flowed by long 80% lesion @ SP1); pRCA 70%. Normal LVEDP. Global HK EF ~45%.  . September 14, 2018- DES PCI: Cx99%&55% - Resolute Onyx DES 3.5 x 30 (3.6 mm), p-mLAD (prox of D1 almost to D2) - Resolute Onyx DES 3.0 x 22 (3.3 mm); DFR on pRCA 70% - 0.99, Not significant -> Rec Med Rx. Diagnostic 1/27     Intervention 1/28     Labs/Other Tests and Data Reviewed:    EKG:  No ECG reviewed.  Recent Labs: 09/29/2018: ALT 13; Magnesium 2.2 10/06/2018: BUN 15; Creatinine, Ser 0.79; Potassium 4.9; Sodium 139 12/24/2018: Hemoglobin 11.7; Platelets 458.0   Recent Lipid Panel Lab Results  Component Value Date   CHOL 266 (H) 11/25/2018   HDL 33.80 (L) 11/25/2018   LDLCALC 202 (H) 09/13/2018   LDLDIRECT 141.0 11/25/2018   TRIG (H) 11/25/2018    410.0 Triglyceride is over 400; calculations on Lipids are invalid.   CHOLHDL 8 11/25/2018    Wt Readings from Last 3 Encounters:  12/29/18 155 lb (70.3 kg)  12/06/18 148 lb (67.1 kg)  10/06/18 152 lb (68.9 kg)     Objective:    Vital Signs:  BP (!) 161/69   Pulse 88   Ht 5' 4.5" (1.638 m)   Wt 155 lb (70.3 kg)   BMI 26.19 kg/m   VITAL SIGNS:  reviewed GEN:  Well nourished, well developed female in no acute distress. RESPIRATORY:  normal respiratory effort, symmetric expansion CARDIOVASCULAR:  no peripheral edema MUSCULOSKELETAL:  no obvious deformities. NEURO:  alert and oriented x 3, no obvious focal deficit PSYCH:  normal affect   ASSESSMENT & PLAN:    Problem List Items Addressed This Visit    Antiplatelet  or antithrombotic long-term use   CAD -S/P PCI (Chronic)    Status post PCI to circumflex and LAD with residual RCA disease.   We will need to remain on Plavix , without aspirin, until January 2021  Has been intolerant of higher dose statin, and has now indicated she did not even tolerate 10 mg rosuvastatin. ->  Will DC statin and start Rocephin 1 g twice daily.  Refer to CVR  Is on carvedilol 6.25 mg twice daily and lisinopril 40 mg daily.  Noting fatigue, will reduce carvedilol to 3.125 mg twice daily (1/2 tablet) and start HCTZ      Relevant Medications   Icosapent Ethyl (VASCEPA) 1 g CAPS   carvedilol (COREG) 6.25 MG tablet   Other Relevant Orders   Comprehensive metabolic panel   Magnesium   Lipid panel   Comprehensive metabolic panel   Dyslipidemia, goal LDL below 70 - Primary (Chronic)    Patient was intolerant to 40 mg of Crestor.  Was reduced to 10 mg in February -- intolerant of even 10 mg.  Still has very high triglycerides & poorly controlled lipids.   Plan   start a Vascepa 1 g twice daily  Recheck lipids in 2 months.  Refer to CVRR ([facility]).      Relevant Medications   Icosapent Ethyl (VASCEPA) 1 g CAPS   carvedilol (COREG) 6.25 MG tablet   Other Relevant Orders   Comprehensive metabolic panel   Lipid panel   Comprehensive metabolic panel   Essential hypertension (Chronic)    Currently on carvedilol 6.25 mg twice daily and lisinopril 40 mg daily --> noting fatigue, partly because of carvedilol.  Blood pressure is high today, but usually not.  Plan:   Reduce carvedilol to 1/2 tablet twice daily (3.125 mg twice daily)  Start HCTZ 25 mg daily -will need  chemistry test next week.      Relevant Medications   Icosapent Ethyl (VASCEPA) 1 g CAPS   carvedilol (COREG) 6.25 MG tablet   Hypercoagulation syndrome (HCC) (Chronic)    Has lupus anticoagulant history with recurrent DVTs and PE.  On long-term  Xarelto. Despite concerns for bleeding, patient and family chose to proceed with PCI as opposed to CABG, is now on combination Plavix plus Xarelto.  Would plan to probably stop Plavix January 2021 and restart aspirin for 1 year then Xarelto alone..      Relevant Orders   Comprehensive metabolic panel   Magnesium   Comprehensive metabolic panel   Hypertriglyceridemia   Relevant Medications   Icosapent Ethyl (VASCEPA) 1 g CAPS   carvedilol (COREG) 6.25 MG tablet   Other Relevant Orders   Lipid panel   Comprehensive metabolic panel   Iron deficiency (Chronic)    Recent GI bleed.  This was my fear in a patient on long-term Xarelto now starting aspirin and Plavix.  We have stopped aspirin.  She has had EGD and colonoscopy with only 3 AVMs noted in the colon.  Hopefully this will resolve the issue since but not much was seen on capsule endoscopy. Being followed by GI and internal medicine.  Most recent hemoglobin stable at 11.7. Unfortunately as noted above, the decision was made to proceed with PCI meaning that she needs to be on Plavix preferably for 1 whole year.  If necessary after 6 months we could hold temporarily to allow for urgent procedures.      Tobacco use (Chronic)    Not ready to discuss cessation.  Is trying to cut back.         COVID-19 Education: The signs and symptoms of COVID-19 were discussed with the patient and how to seek care for testing (follow up with PCP or arrange E-visit).   The importance of social distancing was discussed today.  Time:   Today, I have spent 35 minutes with the patient with telehealth technology discussing the above problems.     Medication Adjustments/Labs and Tests Ordered: Current medicines are reviewed at length with the patient today.  Concerns regarding medicines are outlined above.  Medication Instructions:   Removed Crestor medication list, listed as intolerance  Start Vascepa 1 g twice daily  Reduce carvedilol to 1/2 tablet  twice daily  Start HCTZ 25 mg daily  Change Plavix dosage time to evening  Tests Ordered: Orders Placed This Encounter  Procedures  . Comprehensive metabolic panel  . Magnesium  . Lipid panel  . Comprehensive metabolic panel    CMP with magnesium next week  Fasting lipid panel, CMP in 2 months  Medication Changes: Meds ordered this encounter  Medications  . Icosapent Ethyl (VASCEPA) 1 g CAPS    Sig: Take 1 capsule (1 g total) by mouth 2 (two) times a day.    Dispense:  120 capsule    Refill:  11  . carvedilol (COREG) 6.25 MG tablet    Sig: Take 0.5 tablets (3.125 mg total) by mouth 2 (two) times daily with a meal for 30 days.    Dispense:  60 tablet    Refill:  6    Reduce carvedilol to 1/2 tablet twice daily  Start Vascepa 1 g twice daily   Start HCTZ 25 mg daily    Disposition:  Follow up Dr. Ellyn Hack in 4 months, CV RR earliest possible    Signed, Glenetta Hew, MD  12/29/2018 1:18 PM  [facility]

## 2018-12-29 NOTE — Assessment & Plan Note (Signed)
Has lupus anticoagulant history with recurrent DVTs and PE.  On long-term Xarelto. Despite concerns for bleeding, patient and family chose to proceed with PCI as opposed to CABG, is now on combination Plavix plus Xarelto.  Would plan to probably stop Plavix January 2021 and restart aspirin for 1 year then Xarelto alone.Marland Kitchen

## 2018-12-29 NOTE — Assessment & Plan Note (Addendum)
Recent GI bleed.  This was my fear in a patient on long-term Xarelto now starting aspirin and Plavix.  We have stopped aspirin.  She has had EGD and colonoscopy with only 3 AVMs noted in the colon.  Hopefully this will resolve the issue since but not much was seen on capsule endoscopy. Being followed by GI and internal medicine.  Most recent hemoglobin stable at 11.7. Unfortunately as noted above, the decision was made to proceed with PCI meaning that she needs to be on Plavix preferably for 1 whole year.  If necessary after 6 months we could hold temporarily to allow for urgent procedures.

## 2018-12-29 NOTE — Assessment & Plan Note (Addendum)
Status post PCI to circumflex and LAD with residual RCA disease.   We will need to remain on Plavix , without aspirin, until January 2021  Has been intolerant of higher dose statin, and has now indicated she did not even tolerate 10 mg rosuvastatin. ->  Will DC statin and start Rocephin 1 g twice daily.  Refer to CVR  Is on carvedilol 6.25 mg twice daily and lisinopril 40 mg daily.  Noting fatigue, will reduce carvedilol to 3.125 mg twice daily (1/2 tablet) and start HCTZ

## 2018-12-29 NOTE — Assessment & Plan Note (Addendum)
Patient was intolerant to 40 mg of Crestor.  Was reduced to 10 mg in February -- intolerant of even 10 mg.  Still has very high triglycerides & poorly controlled lipids.   Plan   start a Vascepa 1 g twice daily  Recheck lipids in 2 months.  Refer to CVRR ([facility]).

## 2018-12-29 NOTE — Assessment & Plan Note (Addendum)
Currently on carvedilol 6.25 mg twice daily and lisinopril 40 mg daily --> noting fatigue, partly because of carvedilol.  Blood pressure is high today, but usually not.  Plan:   Reduce carvedilol to 1/2 tablet twice daily (3.125 mg twice daily)  Start HCTZ 25 mg daily -will need chemistry test next week.

## 2018-12-29 NOTE — Assessment & Plan Note (Signed)
Not ready to discuss cessation.  Is trying to cut back.

## 2018-12-29 NOTE — Telephone Encounter (Signed)
LEFT DETAIL MESSAGE IN REGARDS TO INSTRUCTION FROM TELE-VISIT - 5/13  AVS SUMMARY AND LABSLIP MAILED TO PATIENT AND SENT VIA MYCHART

## 2018-12-31 ENCOUNTER — Other Ambulatory Visit: Payer: Self-pay

## 2018-12-31 ENCOUNTER — Telehealth: Payer: Self-pay | Admitting: Pharmacist Clinician (PhC)/ Clinical Pharmacy Specialist

## 2018-12-31 NOTE — Telephone Encounter (Signed)
Just sent the calendar your way!

## 2018-12-31 NOTE — Telephone Encounter (Signed)
LMOM to discuss lipids per [facility]

## 2018-12-31 NOTE — Telephone Encounter (Signed)
Patient needs PA for Vascepa.  Agreed on telephone visit Monday at any time. @[phone]

## 2018-12-31 NOTE — Telephone Encounter (Signed)
REFILLS

## 2019-01-06 ENCOUNTER — Telehealth: Payer: Self-pay | Admitting: Pharmacist

## 2019-01-06 MED ORDER — ICOSAPENT ETHYL 1 G PO CAPS: 1.0000 g | ORAL_CAPSULE | Freq: Two times a day (BID) | ORAL | 11 refills | Status: DC

## 2019-01-06 NOTE — Telephone Encounter (Signed)
*  Telephone follow up (Lipids)*  HPI: [name] [name] is a 63 y.o. female with prior medical history relevant for CAD s/p PCI, DVT, PE, lupus anticoagulant, NSTEMI, and mixed hyperlipidemia.  During most recent visit with DR Massie Bougie was added to her therapy and PCSK9i were recommended.  Patient reports issues getting Vascepa from pharmacy and need counseling about PCSK9i.   Current Medications:  none  Intolerances:  Atorvastatin 40mg  Rosuvastatin 40mg  Rosuvastatin 10mg  Fenofibrate  LDL goal: 70mg  (<50 prefer by cardiologist)  Labs: 11/25/2018: CHO 266; TG 410; HDL 33.8; LDL-d 141   Assessment and plan:  Vascepa needed prior authorization from insurance. I was able to get it approved today 5/21 for 1 year. New Rx and co-pay card sent to pharmacy.  PCSK9i indication, administration, and common side effects were discussed with patient today. She will like to start Vascepa at this time and call back in 2-3 week to start Repatha trial.   PA for Repatha already approved and ready to send RX to prefer pharmacy once patient ready.   Kajol Crispen Rodriguez-Guzman PharmD, BCPS, [place] [address] [place] [address] 01/06/2019 1:01 PM

## 2019-01-06 NOTE — Telephone Encounter (Signed)
Called patient to work as requested.  Unable to talk at this time. Will like a call back around 1pm.

## 2019-01-06 NOTE — Telephone Encounter (Signed)
SEE TELEPHONE ENCOUNTER FROM 01/06/2019

## 2019-01-06 NOTE — Telephone Encounter (Signed)
Patient is called stating no has called her back, she would like to speak to someone about this.

## 2019-01-11 ENCOUNTER — Telehealth: Payer: Self-pay

## 2019-01-11 DIAGNOSIS — D6859 Other primary thrombophilia: Secondary | ICD-10-CM | POA: Diagnosis not present

## 2019-01-11 DIAGNOSIS — Z9861 Coronary angioplasty status: Secondary | ICD-10-CM | POA: Diagnosis not present

## 2019-01-11 DIAGNOSIS — E785 Hyperlipidemia, unspecified: Secondary | ICD-10-CM | POA: Diagnosis not present

## 2019-01-11 DIAGNOSIS — I2510 Atherosclerotic heart disease of native coronary artery without angina pectoris: Secondary | ICD-10-CM | POA: Diagnosis not present

## 2019-01-11 MED ORDER — OMEGA-3-ACID ETHYL ESTERS 1 G PO CAPS: 2.0000 g | ORAL_CAPSULE | Freq: Every day | ORAL | 11 refills | Status: DC

## 2019-01-11 NOTE — Telephone Encounter (Signed)
Try Lovaza 2 gram daily Disp: # [phone] refills  Glenetta Hew, MD

## 2019-01-11 NOTE — Telephone Encounter (Signed)
New prescription from Dr. Ellyn Hack sent to the patients preferred pharmacy ([facility]). Patient states that the vascepa is at the pharmacy. I informed her that it was not covered by her insurance and Dr. Ellyn Hack had prescribed Lovaza in place of the Vascepa since it was not covered.I also instructed the patient to take either the vascepa or the lovaza but not to take both. Patient verbalized understanding.

## 2019-01-11 NOTE — Telephone Encounter (Signed)
Patients insurance has denied her prescription for Vascepa 1gm. Please advise on another medication.

## 2019-01-12 ENCOUNTER — Telehealth: Payer: Self-pay | Admitting: Cardiology

## 2019-01-12 DIAGNOSIS — I2510 Atherosclerotic heart disease of native coronary artery without angina pectoris: Secondary | ICD-10-CM

## 2019-01-12 DIAGNOSIS — E785 Hyperlipidemia, unspecified: Secondary | ICD-10-CM

## 2019-01-12 DIAGNOSIS — D6859 Other primary thrombophilia: Secondary | ICD-10-CM

## 2019-01-12 DIAGNOSIS — Z9861 Coronary angioplasty status: Secondary | ICD-10-CM

## 2019-01-12 DIAGNOSIS — I10 Essential (primary) hypertension: Secondary | ICD-10-CM

## 2019-01-12 DIAGNOSIS — E781 Pure hyperglyceridemia: Secondary | ICD-10-CM

## 2019-01-12 LAB — COMPREHENSIVE METABOLIC PANEL
ALT: 13 IU/L (ref 0–32)
AST: 18 IU/L (ref 0–40)
Albumin/Globulin Ratio: 1.6 (ref 1.2–2.2)
Albumin: 4.6 g/dL (ref 3.8–4.8)
Alkaline Phosphatase: 127 IU/L — ABNORMAL HIGH (ref 39–117)
BUN/Creatinine Ratio: 15 (ref 12–28)
BUN: 16 mg/dL (ref 8–27)
Bilirubin Total: <0.2 mg/dL (ref 0.0–1.2)
CO2: 20 mmol/L (ref 20–29)
Calcium: 10.3 mg/dL (ref 8.7–10.3)
Chloride: 96 mmol/L (ref 96–106)
Creatinine, Ser: 1.05 mg/dL — ABNORMAL HIGH (ref 0.57–1.00)
GFR calc Af Amer: 65 mL/min/{1.73_m2} (ref >59)
GFR calc non Af Amer: 57 mL/min/{1.73_m2} — ABNORMAL LOW (ref >59)
Globulin, Total: 2.9 g/dL (ref 1.5–4.5)
Glucose: 69 mg/dL (ref 65–99)
Potassium: 5.3 mmol/L — ABNORMAL HIGH (ref 3.5–5.2)
Sodium: 134 mmol/L (ref 134–144)
Total Protein: 7.5 g/dL (ref 6.0–8.5)

## 2019-01-12 LAB — MAGNESIUM: Magnesium: 2.1 mg/dL (ref 1.6–2.3)

## 2019-01-12 NOTE — Telephone Encounter (Signed)
Pt aware labs have not been reviewed by Dr Ellyn Hack at this time did however informed pt to cut back on Potassium rich foods as K was slighlty elevated. Pt was wanting to know what hgb was and informed pt CBC was not done Pt sounded tearful on phone as does not feel well extreme fatigue. Per pt has to sit down to peel potatoes. Spoke with Rickard Patience in lab to see if could add CBC to labs done yesterday unable to do that B/p yesterday was 118/67.WIll forward to Dr Ellyn Hack for review .Adonis Housekeeper

## 2019-01-12 NOTE — Telephone Encounter (Signed)
New Message    Pt is calling for her lab results   Please call

## 2019-01-13 NOTE — Telephone Encounter (Signed)
Plan to review labs.  Was very busy during DOD day.

## 2019-01-14 ENCOUNTER — Other Ambulatory Visit: Payer: Self-pay | Admitting: Family

## 2019-01-14 DIAGNOSIS — Z86718 Personal history of other venous thrombosis and embolism: Secondary | ICD-10-CM

## 2019-01-14 NOTE — Telephone Encounter (Signed)
Spoke with patient at length.  She is upset because apparently some additional blood work should have been done on Tuesday due to her ongoing fatigue  (assume a CBC).  She was quite irate at the thought of decreasing lisinopril 40 mg, which she has been on for some time.  She refuses to take additional medication until someone can solve her fatigue problem. States she is frustrated as she feels nobody is listening to her.  In talking with her, she also does not like the HCTZ, which was added earlier in May, because "she didn't need a fluid pill".  States home BP readings in the mornings usually run around 638 systolic.   At her telehealth visit with Dr. Ellyn Hack he cut the carvedilol from 6.25 mg to 3.125 mg bid to see if this would help with fatigue issues.  Patient today reports no change in fatigue.  Explained the bump in her SCr, so we will discontinue the HCTZ 25 mg and leave the lisinopril at 40 mg for now.  She will need a repeat BMET in 10-14 days and will get CBC at the same time.   Patient agreeable to this plan and will come for blood work as scheduled.  She is agreeable to this.

## 2019-01-14 NOTE — Telephone Encounter (Signed)
LMOM for patient to return call.

## 2019-01-14 NOTE — Telephone Encounter (Signed)
-----   Message from Leonie Man, MD sent at 01/13/2019 11:08 PM EDT ----- Kidney function looks a little worse, but otherwise labs look okay..  This goes along with also with the potassium being a little bit high.  Would like to increase fluid intake and cut down on any foods high in potassium.  --Please cut lisinopril dose down to 1/2 tablet, and start amlodipine 5 mg daily. For now is try to continue with the carvedilol, but we may need to consider switching.  We will recheck a BMP in 2 weeks. Will check on CVRR f/u for cholesterol levels (can also reassess BP & fatigue Sx possibly related to Coreg).   Glenetta Hew, MD

## 2019-01-17 ENCOUNTER — Telehealth: Payer: Self-pay | Admitting: Family Medicine

## 2019-01-17 ENCOUNTER — Other Ambulatory Visit: Payer: Self-pay | Admitting: Family Medicine

## 2019-01-17 DIAGNOSIS — Z86718 Personal history of other venous thrombosis and embolism: Secondary | ICD-10-CM

## 2019-01-17 MED ORDER — RIVAROXABAN 20 MG PO TABS: 20.0000 mg | ORAL_TABLET | Freq: Every day | ORAL | 3 refills | Status: DC

## 2019-01-17 NOTE — Telephone Encounter (Signed)
Rx for xarelto 20 mg sent to pharmacy.

## 2019-01-17 NOTE — Telephone Encounter (Signed)
Pt LOV was 10/11/2018 and last refill was done by historical provider on 11/26/2017. Please advise if ok to refill

## 2019-01-17 NOTE — Telephone Encounter (Signed)
Copied from [place] [id number]. Topic: Quick Communication - Rx Refill/Question >> Jan 17, 2019  2:05 PM Ahmed Prima L wrote: Medication: rivaroxaban (XARELTO) 20 MG TABS tablet   Has the patient contacted their pharmacy? Yes  (Agent: If no, request that the patient contact the pharmacy for the refill.) (Agent: If yes, when and what did the pharmacy advise?)  Preferred Pharmacy (with phone number or street name): [id number] [place], [place] [address] Phone: [phone] Fax: [phone]    Agent: Please be advised that RX refills may take up to 3 business days. We ask that you follow-up with your pharmacy.

## 2019-01-17 NOTE — Telephone Encounter (Signed)
She was supposed to have CBC - so lets do CBC with upcoming labs.  Otw- I really don't know how to help her.  Fatigue is soo frustrating -- the thought was cutting ACE-I to allow for more BP.  If CBC is stable - will need to refer back to PCP.  Glenetta Hew, MD None

## 2019-01-20 ENCOUNTER — Other Ambulatory Visit: Payer: Self-pay | Admitting: Family

## 2019-01-24 ENCOUNTER — Other Ambulatory Visit: Payer: Self-pay | Admitting: Cardiology

## 2019-01-24 ENCOUNTER — Other Ambulatory Visit: Payer: Self-pay

## 2019-01-24 MED ORDER — PANTOPRAZOLE SODIUM 40 MG PO TBEC: 40.0000 mg | DELAYED_RELEASE_TABLET | Freq: Every day | ORAL | 11 refills | Status: DC

## 2019-01-24 NOTE — Telephone Encounter (Signed)
Pt calling requesting a refill on [facility] Please address

## 2019-01-24 NOTE — Telephone Encounter (Signed)
Rx(s) sent to pharmacy electronically.  

## 2019-01-26 DIAGNOSIS — I10 Essential (primary) hypertension: Secondary | ICD-10-CM | POA: Diagnosis not present

## 2019-01-26 LAB — CBC WITH DIFFERENTIAL/PLATELET
Basophils Absolute: 0 10*3/uL (ref 0.0–0.2)
Basos: 1 %
EOS (ABSOLUTE): 0.3 10*3/uL (ref 0.0–0.4)
Eos: 3 %
Hematocrit: 36.4 % (ref 34.0–46.6)
Hemoglobin: 11.8 g/dL (ref 11.1–15.9)
Immature Grans (Abs): 0 10*3/uL (ref 0.0–0.1)
Immature Granulocytes: 0 %
Lymphocytes Absolute: 2.2 10*3/uL (ref 0.7–3.1)
Lymphs: 25 %
MCH: 27.8 pg (ref 26.6–33.0)
MCHC: 32.4 g/dL (ref 31.5–35.7)
MCV: 86 fL (ref 79–97)
Monocytes Absolute: 0.6 10*3/uL (ref 0.1–0.9)
Monocytes: 7 %
Neutrophils Absolute: 5.7 10*3/uL (ref 1.4–7.0)
Neutrophils: 64 %
Platelets: 455 10*3/uL — ABNORMAL HIGH (ref 150–450)
RBC: 4.25 x10E6/uL (ref 3.77–5.28)
RDW: 15.6 % — ABNORMAL HIGH (ref 11.7–15.4)
WBC: 8.8 10*3/uL (ref 3.4–10.8)

## 2019-01-26 LAB — BASIC METABOLIC PANEL
BUN/Creatinine Ratio: 9 — ABNORMAL LOW (ref 12–28)
BUN: 7 mg/dL — ABNORMAL LOW (ref 8–27)
CO2: 22 mmol/L (ref 20–29)
Calcium: 10.4 mg/dL — ABNORMAL HIGH (ref 8.7–10.3)
Chloride: 102 mmol/L (ref 96–106)
Creatinine, Ser: 0.79 mg/dL (ref 0.57–1.00)
GFR calc Af Amer: 92 mL/min/{1.73_m2} (ref >59)
GFR calc non Af Amer: 80 mL/min/{1.73_m2} (ref >59)
Glucose: 89 mg/dL (ref 65–99)
Potassium: 5.3 mmol/L — ABNORMAL HIGH (ref 3.5–5.2)
Sodium: 136 mmol/L (ref 134–144)

## 2019-01-31 ENCOUNTER — Telehealth: Payer: Self-pay | Admitting: Cardiology

## 2019-01-31 DIAGNOSIS — I10 Essential (primary) hypertension: Secondary | ICD-10-CM

## 2019-01-31 NOTE — Telephone Encounter (Signed)
Returned call to patient she stated she went to dentist this morning for a tooth ache.Stated dentist prescribed Amoxicillin.She wanted to ask Dr.Harding if ok to take.She also wanted to know lab results from last week.Message sent to [place] for advice.

## 2019-01-31 NOTE — Telephone Encounter (Signed)
OK to take Amox  Labs reviewed - result note  Cr better - K still 5.3 -- would like to reduce Lisinopril to 1/2 tab x 2 weeks.  (this is a potential reason for considering HCTZ)  Glenetta Hew, MD

## 2019-01-31 NOTE — Telephone Encounter (Signed)
New Message      Pt c/o medication issue:  1. Name of Medication: Patient hasn't picked it up from the pharmacy yet so she doesn't know.  2. How are you currently taking this medication (dosage and times per day)? N/A  3. Are you having a reaction (difficulty breathing--STAT)? no  4. What is your medication issue? Patient went to the dentist for a toothache and was put on a antibiotic due to it being infected.  She hasn't picked up the medication yet so she doesn't know the name of it, but was told to call cardiologist to make sure it's ok for her to take it.  Advised we would need to know the name of it so she's calling the pharmacy to get the name so she'll have it by the time someone calls her.

## 2019-01-31 NOTE — Telephone Encounter (Signed)
Spoke to patient Dr.Harding's advice given.Advised to repeat bmet in 2 weeks.Advised to call sooner if B/P elevates.

## 2019-02-02 ENCOUNTER — Telehealth: Payer: Self-pay

## 2019-02-02 NOTE — Telephone Encounter (Signed)
   [facility] Pre-operative Risk Assessment    Request for surgical clearance:  1. What type of surgery is being performed? tooth #12 extraction   2. When is this surgery scheduled? pending   3. What type of clearance is required (medical clearance vs. Pharmacy clearance to hold med vs. Both)? both  4. Are there any medications that need to be held prior to surgery and how long? Xarelto 20 mg QD; 2 days  5. Practice name and name of physician performing surgery? Dr [facility]   6. What is your office phone number? [phone]    7.   What is your office fax number? [phone]  8.   Anesthesia type (None, local, MAC, general)? local   [name], [name] 02/02/2019, 4:54 PM  _________________________________________________________________

## 2019-02-03 NOTE — Telephone Encounter (Signed)
  Dr. Ellyn Hack,   Please comment on Plavix and ability to hold if needed for #12 tooth extraction. She is also on long term Xarelto for recurrent DVTs/PE that will need to be addressed. You last saw her 12/29/2018 and her only complaint was fatigue in which you adjusted her carvedilol and added HCTZ.   Thank you  Sharee Pimple

## 2019-02-03 NOTE — Telephone Encounter (Signed)
I am not the one managing the Xarelto.  She is on it for lupus anticoagulant and I think it be okay for that to be stopped.  Aspirin or Plavix.  She is less than 6 months out from a STEMI, so it is relatively high risk to stop the Plavix.  However if she is having bad tooth pain and needs to have it done as an emergency, then they would hold it 5 to 7 days preop.  The Xarelto will be 2 days preop  Betasept understand if there is a risk of stent thrombosis with stopping Plavix   Glenetta Hew, MD

## 2019-02-04 NOTE — Telephone Encounter (Signed)
   Primary Cardiologist: Glenetta Hew, MD  Chart reviewed as part of pre-operative protocol coverage. Simple dental extractions are considered low risk procedures per guidelines and generally do not require any specific cardiac clearance. It is also generally accepted that for simple extractions and dental cleanings, there is no need to interrupt blood thinner therapy.   This patient is on Plavix following recent NSTEMI and placement of 2 stents including proximal LAD in 08/2018. It is preferred that Plavix not be interrupted since she is only out 6 months from her MI, however, if interruption is felt necessary for urgent procedure, per Dr. Ellyn Hack, Plavix could be held for 5-7 days preop with the understanding that there is increased risk for stent thrombosis. Hopefully the tooth can be extracted while on Plavix.   The patient is also on Xarelto for lupus anticoagulant, not managed by cardiology. From a cardiology standpoint the Xarelto could be held for 2 days prior to procedure. This medication is managed by Dr. Volanda Napoleon.   I will route this recommendation to the requesting party via Epic fax function and remove from pre-op pool.  Please call with questions.  Daune Perch, NP 02/04/2019, 10:35 AM

## 2019-02-14 NOTE — Telephone Encounter (Signed)
No documents were received by this provider for this pt until today 02/14/19.  Matter has been addressed, form sent to dental office this evening.  Procedure could be done with pt still on xarelto.  If needed could stop xarelto 2 days prior to procedure and resumed the day procedure is completed.

## 2019-02-14 NOTE — Telephone Encounter (Signed)
This call is in ref. To Dr Cathey Endow office. Pt is in pain and is needing this procedure. [facility] office has faxed over last required docs on 23rd and has not heard back. They are requesingt a call back from Dr Volanda Napoleon or her nurse at this point as has gotten complicated. Please return call to gretchen at [phone] asap

## 2019-02-14 NOTE — Telephone Encounter (Signed)
Pt procedure clearance form was received today 02/14/2019 at 4.13 pm, spoke with Gretcher and confirmed that our office has not received pt form even after calling the dentist office on Friday 02/11/2019 spoke with Gretcher and advised to refax the form since the office had not received it. No faxed was received this morning so I call the dentist office and again spoke with Gretcher who refax the form, received completed and faxed to the the dentist fax number and confirmation received.

## 2019-02-17 NOTE — Telephone Encounter (Signed)
Open n error °

## 2019-03-17 ENCOUNTER — Telehealth: Payer: Self-pay | Admitting: Cardiology

## 2019-03-17 DIAGNOSIS — I10 Essential (primary) hypertension: Secondary | ICD-10-CM

## 2019-03-17 MED ORDER — HYDROCHLOROTHIAZIDE 25 MG PO TABS: 12.5000 mg | ORAL_TABLET | Freq: Every day | ORAL | 3 refills | Status: DC

## 2019-03-17 NOTE — Telephone Encounter (Signed)
Patient notes BP elevated recently.  Never decreased the lisinopril dose from 40 mg to 20 mg.     She notes she previously was on HCTZ, but had to stop because of increase in SCr and significant drop in BP.   Asked that she re-start with just 12.5 mg once daily and we will repeat labs 10-14 days after starting.  She should also check home BP twice daily.  We will reach out to her in 2 weeks for follow up.

## 2019-03-17 NOTE — Telephone Encounter (Signed)
New message   Pt c/o BP issue: STAT if pt c/o blurred vision, one-sided weakness or slurred speech  1. What are your last 5 BP readings? 170/75 bp 89 hr   2. Are you having any other symptoms (ex. Dizziness, headache, blurred vision, passed out)? No   3. What is your BP issue? Patient states that her blood pressure goes up and down

## 2019-03-17 NOTE — Telephone Encounter (Signed)
Spoke to patient she stated for the past 2 days her B/P has been elevated.Stated she has not done anything different.B/P ranging 170/75,170/79 pulse 85,89.Advised I will send message to pharmacist for advice.

## 2019-04-11 ENCOUNTER — Other Ambulatory Visit: Payer: Self-pay | Admitting: Cardiology

## 2019-05-16 ENCOUNTER — Emergency Department (HOSPITAL_COMMUNITY)
Admission: EM | Admit: 2019-05-16 | Discharge: 2019-05-17 | Disposition: A | Discharge status: Home / Self Care | Payer: BC Managed Care – PPO | Attending: Emergency Medicine | Admitting: Emergency Medicine

## 2019-05-16 ENCOUNTER — Emergency Department (HOSPITAL_COMMUNITY): Payer: BC Managed Care – PPO

## 2019-05-16 ENCOUNTER — Other Ambulatory Visit: Payer: Self-pay

## 2019-05-16 ENCOUNTER — Telehealth: Payer: Self-pay | Admitting: Physician Assistant

## 2019-05-16 ENCOUNTER — Encounter (HOSPITAL_COMMUNITY): Payer: Self-pay

## 2019-05-16 DIAGNOSIS — I2510 Atherosclerotic heart disease of native coronary artery without angina pectoris: Secondary | ICD-10-CM | POA: Insufficient documentation

## 2019-05-16 DIAGNOSIS — Z79899 Other long term (current) drug therapy: Secondary | ICD-10-CM | POA: Diagnosis not present

## 2019-05-16 DIAGNOSIS — R079 Chest pain, unspecified: Secondary | ICD-10-CM | POA: Diagnosis not present

## 2019-05-16 DIAGNOSIS — Z959 Presence of cardiac and vascular implant and graft, unspecified: Secondary | ICD-10-CM | POA: Insufficient documentation

## 2019-05-16 DIAGNOSIS — Z7901 Long term (current) use of anticoagulants: Secondary | ICD-10-CM | POA: Diagnosis not present

## 2019-05-16 DIAGNOSIS — I10 Essential (primary) hypertension: Secondary | ICD-10-CM | POA: Insufficient documentation

## 2019-05-16 DIAGNOSIS — Z86718 Personal history of other venous thrombosis and embolism: Secondary | ICD-10-CM | POA: Insufficient documentation

## 2019-05-16 DIAGNOSIS — Z86711 Personal history of pulmonary embolism: Secondary | ICD-10-CM | POA: Insufficient documentation

## 2019-05-16 DIAGNOSIS — F17210 Nicotine dependence, cigarettes, uncomplicated: Secondary | ICD-10-CM | POA: Diagnosis not present

## 2019-05-16 LAB — BASIC METABOLIC PANEL
Anion gap: 12 (ref 5–15)
BUN: 6 mg/dL — ABNORMAL LOW (ref 8–23)
CO2: 23 mmol/L (ref 22–32)
Calcium: 9.8 mg/dL (ref 8.9–10.3)
Chloride: 104 mmol/L (ref 98–111)
Creatinine, Ser: 0.7 mg/dL (ref 0.44–1.00)
GFR calc Af Amer: >60 mL/min (ref >60)
GFR calc non Af Amer: >60 mL/min (ref >60)
Glucose, Bld: 120 mg/dL — ABNORMAL HIGH (ref 70–99)
Potassium: 4.6 mmol/L (ref 3.5–5.1)
Sodium: 139 mmol/L (ref 135–145)

## 2019-05-16 LAB — CBC
HCT: 40 % (ref 36.0–46.0)
Hemoglobin: 13 g/dL (ref 12.0–15.0)
MCH: 30 pg (ref 26.0–34.0)
MCHC: 32.5 g/dL (ref 30.0–36.0)
MCV: 92.2 fL (ref 80.0–100.0)
Platelets: 458 10*3/uL — ABNORMAL HIGH (ref 150–400)
RBC: 4.34 MIL/uL (ref 3.87–5.11)
RDW: 12.4 % (ref 11.5–15.5)
WBC: 11.9 10*3/uL — ABNORMAL HIGH (ref 4.0–10.5)
nRBC: 0 % (ref 0.0–0.2)

## 2019-05-16 LAB — TROPONIN I (HIGH SENSITIVITY): Troponin I (High Sensitivity): 9 ng/L (ref <18)

## 2019-05-16 MED ORDER — SODIUM CHLORIDE 0.9% FLUSH
3.0000 mL | Freq: Once | INTRAVENOUS | Status: AC
  Administered 2019-05-17: 01:00:00 3 mL via INTRAVENOUS

## 2019-05-16 NOTE — Telephone Encounter (Signed)
Pt called stating she had three bouts of chest pain today. She was away from home. CP was 3-4 sec each about 5-10 min apart. She took nitro tablet x 1 with relief of your chest pain. When she got home she took her pressure and it was 214/105. She has no headache or changes in her vision. She takes 40 mg lisinopril, coreg, and HCTZ. Recheck while I was on the phone was 210/110 and 235/109. We discussed dangers of hypertensive urgency in conjunction with her earlier CP and I advised ER evaluation. She expressed understanding of the plan.

## 2019-05-16 NOTE — ED Triage Notes (Signed)
Pt reports chest pain intermittent today. Hx of Mi with stent placement. Pt hypertensive 230 SBP. Pt denies headache, dizziness, nausea. Pt a.o

## 2019-05-17 LAB — TROPONIN I (HIGH SENSITIVITY): Troponin I (High Sensitivity): 8 ng/L (ref <18)

## 2019-05-17 MED ORDER — AMLODIPINE BESYLATE 5 MG PO TABS
5.0000 mg | ORAL_TABLET | Freq: Once | ORAL | Status: AC
  Administered 2019-05-17: 5 mg via ORAL
  Filled 2019-05-17: qty 1

## 2019-05-17 MED ORDER — CLONIDINE HCL 0.2 MG PO TABS
0.2000 mg | ORAL_TABLET | Freq: Once | ORAL | Status: AC
  Administered 2019-05-17: 0.2 mg via ORAL
  Filled 2019-05-17: qty 1

## 2019-05-17 MED ORDER — AMLODIPINE BESYLATE 5 MG PO TABS: 5.0000 mg | ORAL_TABLET | Freq: Every day | ORAL | 0 refills | Status: DC | PRN

## 2019-05-17 MED ORDER — LABETALOL HCL 5 MG/ML IV SOLN
20.0000 mg | Freq: Once | INTRAVENOUS | Status: AC
  Administered 2019-05-17: 20 mg via INTRAVENOUS
  Filled 2019-05-17: qty 4

## 2019-05-17 NOTE — ED Provider Notes (Signed)
[facility] EMERGENCY DEPARTMENT Provider Note   CSN: [id number] Arrival date & time: 05/16/19  2042     History   Chief Complaint Chief Complaint  Patient presents with  . Hypertension  . Chest Pain    HPI [name] [name] is a 63 y.o. female.     Patient presents to the emergency department for evaluation of chest pain.  Patient reports that she was at work earlier today and she had 3 episodes of a sharp pain on the left side of her chest.  Each episode lasted approximately 2 seconds and then resolved.  No associated shortness of breath, nausea, diaphoresis.  Patient did recently have a heart attack, reports that this pain felt totally different.     Past Medical History:  Diagnosis Date  . Back pain   . CAD S/P percutaneous coronary angioplasty 09/13/2018   09/13/2018 - Cath & Staged DES PCI on 09/14/2018: DES PCI: Cx99%&55% - Resolute Onyx DES 3.5 x 30 (3.6 mm), p-mLAD (prox of D1 almost to D2) - Resolute Onyx DES 3.0 x 22 (3.3 mm); DFR on pRCA 70% - 0.99, Not significant -> Rec Med Rx.  . DVT (deep venous thrombosis) ([place])   . Heart attack ([place]) 09/09/2018  . Hypertension   . Lupus anticoagulant syndrome ([place])   . NSTEMI (non-ST elevated myocardial infarction) ([place]) 09/12/2018   Initial presentation with chest pain was on 09/09/2018, recurrent pain on 1/26 2020 with positive troponins.  Cath showed three-vessel CAD -> declined CABG, opted for two-vessel DES PCI (LAD and CX, negative DFR RCA)  . Pulmonary embolism [facility])     Patient Active Problem List   Diagnosis Date Noted  . Heme positive stool   . Antiplatelet or antithrombotic long-term use   . Acute on chronic anemia 09/28/2018  . CAD -S/P PCI 09/24/2018  . Non-ST elevation (NSTEMI) myocardial infarction ([place])   . History of pulmonary embolus (PE) 09/12/2018  . GERD (gastroesophageal reflux disease) 12/25/2016  . Microcytic anemia 01/24/2016  . Hypertriglyceridemia 01/24/2016  .  Dyslipidemia, goal LDL below 70 06/21/2015  . History of DVT of lower extremity   . Edema of left lower extremity   . Hypercoagulation syndrome ([place]) 05/19/2012  . Iron deficiency 05/19/2012  . Thrombocytosis ([place]) 05/19/2012  . Acute lower GI bleeding 05/18/2012  . Sinus tachycardia 04/21/2012  . Hypokalemia 04/20/2012  . Tobacco use 04/20/2012  . Essential hypertension 04/20/2012    Past Surgical History:  Procedure Laterality Date  . BACK SURGERY    . COLONOSCOPY  05/19/2012   Procedure: COLONOSCOPY;  Surgeon: Beryle Beams, MD;  Location: [place];  Service: Endoscopy;  Laterality: N/A;  . COLONOSCOPY WITH PROPOFOL N/A 09/30/2018   Procedure: COLONOSCOPY WITH PROPOFOL;  Surgeon: Yetta Flock, MD;  Location: [place];  Service: Gastroenterology;  Laterality: N/A;  . CORONARY STENT INTERVENTION N/A 09/14/2018   Procedure: CORONARY STENT INTERVENTION;  Surgeon: Martinique, Peter M, MD;  Location: [place] CV LAB;  Service: Cardiovascular: September 14, 2018- DES PCI: Cx99%&55% - Resolute Onyx DES 3.5 x 30 (3.6 mm), p-mLAD (prox of D1 almost to D2) - Resolute Onyx DES 3.0 x 22 (3.3 mm); DFR on pRCA 70% - 0.99, Not significant -> Rec Med Rx.  . ESOPHAGOGASTRODUODENOSCOPY  05/19/2012   Procedure: ESOPHAGOGASTRODUODENOSCOPY (EGD);  Surgeon: Beryle Beams, MD;  Location[facility] ENDOSCOPY;  Service: Endoscopy;  Laterality: N/A;  . ESOPHAGOGASTRODUODENOSCOPY (EGD) WITH PROPOFOL N/A 09/30/2018   Procedure: ESOPHAGOGASTRODUODENOSCOPY (EGD) WITH PROPOFOL;  Surgeon: Yetta Flock, MD;  Location: [facility] ENDOSCOPY;  Service: Gastroenterology;  Laterality: N/A;  . GIVENS CAPSULE STUDY N/A 09/30/2018   Procedure: GIVENS CAPSULE STUDY;  Surgeon: Yetta Flock, MD;  Location: [place];  Service: Gastroenterology;  Laterality: N/A;  . HOT HEMOSTASIS N/A 09/30/2018   Procedure: HOT HEMOSTASIS (ARGON PLASMA COAGULATION/BICAP);  Surgeon: Yetta Flock, MD;  Location: [facility] ENDOSCOPY;   Service: Gastroenterology;  Laterality: N/A;  . INTRAVASCULAR PRESSURE WIRE/FFR STUDY N/A 09/14/2018   Procedure: INTRAVASCULAR PRESSURE WIRE/FFR STUDY;  Surgeon: Martinique, Peter M, MD;  Location: [place] CV LAB;  Service: Cardiovascular;  Laterality: RCA: DFR on pRCA 70% - 0.99, Not significant -> Rec Med Rx.  Marland Kitchen LEFT HEART CATH AND CORONARY ANGIOGRAPHY N/A 09/13/2018   Procedure: LEFT HEART CATH AND CORONARY ANGIOGRAPHY;  Surgeon: Leonie Man, MD;  Location: [place] CV LAB;  Service: Cardiovascular:: CULPRIT LESION 99% p-mCx followed by 55%mCx; pLAD 35% A D47fllowed by long 80% lesion @ SP1); pRCA 70%. Normal LVEDP. Global HK EF ~45%.  -Decision on PCI deferred until discussion about PCI versus CABG.  Concern because of long-term DOAC.  .Marland KitchenTRANSTHORACIC ECHOCARDIOGRAM  09/13/2018   Mildly reduced EF 45 to 50% with diffuse HK.  GR 1 DD.  Mild aortic valve calcification.  MAC.  Moderate pulmonic regurgitation.     OB History   No obstetric history on file.      Home Medications    Prior to Admission medications   Medication Sig Start Date End Date Taking? Authorizing Provider  acetaminophen (TYLENOL) 500 MG tablet Take 1,000 mg by mouth every 6 (six) hours as needed for mild pain.     [provider]  amLODipine (NORVASC) 5 MG tablet Take 1 tablet (5 mg total) by mouth daily as needed (high blood pressure not controlled with your normal meds). 05/17/19   POrpah Greek MD  bisacodyl (DULCOLAX) 5 MG EC tablet Take 5 mg by mouth daily as needed for moderate constipation.    [provider]  carvedilol (COREG) 6.25 MG tablet Take 0.5 tablets (3.125 mg total) by mouth 2 (two) times daily with a meal for 30 days. 12/29/18 01/28/19  HLeonie Man MD  clopidogrel (PLAVIX) 75 MG tablet TAKE 1 TABLET BY MOUTH EVERY DAY 04/12/19   HLeonie Man MD  ferrous sulfate 325 (65 FE) MG tablet Take 1 tablet (325 mg total) by mouth 2 (two) times daily. 12/06/18   Armbruster,  SCarlota Raspberry MD  hydrochlorothiazide (HYDRODIURIL) 25 MG tablet Take 0.5 tablets (12.5 mg total) by mouth daily. 03/17/19 06/15/19  HLeonie Man MD  Icosapent Ethyl (VASCEPA) 1 g CAPS Take 1 capsule (1 g total) by mouth 2 (two) times a day. 01/06/19   HLeonie Man MD  lisinopril (ZESTRIL) 40 MG tablet Take 1 tablet (40 mg total) by mouth daily. 03/17/19   HLeonie Man MD  nitroGLYCERIN (NITROSTAT) 0.4 MG SL tablet Place 1 tablet (0.4 mg total) under the tongue every 5 (five) minutes as needed for up to 3 doses for chest pain. 09/15/18   PElodia Florence, MD  omega-3 acid ethyl esters (LOVAZA) 1 g capsule Take 2 capsules (2 g total) by mouth daily. 01/11/19   HLeonie Man MD  pantoprazole (PROTONIX) 40 MG tablet Take 1 tablet (40 mg total) by mouth daily. 01/24/19 01/24/20  HLeonie Man MD  rivaroxaban (XARELTO) 20 MG TABS tablet Take 1 tablet (20 mg total) by mouth  daily. 01/17/19   Billie Ruddy, MD    Family History Family History  Problem Relation Age of Onset  . Leukemia Mother   . Prostate cancer Father   . Cancer Father   . Stroke Maternal Grandmother   . Lung cancer Maternal Grandfather   . Leukemia Paternal Grandmother   . Cancer Paternal Grandfather   . Spina bifida Son        Multiple surgeries  . Colon cancer Neg Hx   . Esophageal cancer Neg Hx     Social History Social History   Tobacco Use  . Smoking status: Current Every Day Smoker    Packs/day: 0.50    Years: 29.00    Pack years: 14.50    Types: Cigarettes  . Smokeless tobacco: Never Used  Substance Use Topics  . Alcohol use: Yes    Comment: Occasional  . Drug use: No     Allergies   Prednisone, Tramadol, and Crestor [rosuvastatin calcium]   Review of Systems Review of Systems  Cardiovascular: Positive for chest pain.  All other systems reviewed and are negative.    Physical Exam Updated Vital Signs BP (!) 154/70   Pulse 68   Temp 98 F (36.7 C) (Oral)   Resp 15   SpO2  97%   Physical Exam Vitals signs and nursing note reviewed.  Constitutional:      General: She is not in acute distress.    Appearance: Normal appearance. She is well-developed.  HENT:     Head: Normocephalic and atraumatic.     Right Ear: Hearing normal.     Left Ear: Hearing normal.     Nose: Nose normal.  Eyes:     Conjunctiva/sclera: Conjunctivae normal.     Pupils: Pupils are equal, round, and reactive to light.  Neck:     Musculoskeletal: Normal range of motion and neck supple.  Cardiovascular:     Rate and Rhythm: Regular rhythm.     Heart sounds: S1 normal and S2 normal. No murmur. No friction rub. No gallop.   Pulmonary:     Effort: Pulmonary effort is normal. No respiratory distress.     Breath sounds: Normal breath sounds.  Chest:     Chest wall: No tenderness.  Abdominal:     General: Bowel sounds are normal.     Palpations: Abdomen is soft.     Tenderness: There is no abdominal tenderness. There is no guarding or rebound. Negative signs include Murphy's sign and McBurney's sign.     Hernia: No hernia is present.  Musculoskeletal: Normal range of motion.  Skin:    General: Skin is warm and dry.     Findings: No rash.  Neurological:     Mental Status: She is alert and oriented to person, place, and time.     GCS: GCS eye subscore is 4. GCS verbal subscore is 5. GCS motor subscore is 6.     Cranial Nerves: No cranial nerve deficit.     Sensory: No sensory deficit.     Coordination: Coordination normal.  Psychiatric:        Speech: Speech normal.        Behavior: Behavior normal.        Thought Content: Thought content normal.      ED Treatments / Results  Labs (all labs ordered are listed, but only abnormal results are displayed) Labs Reviewed  BASIC METABOLIC PANEL - Abnormal; Notable for the following components:  Result Value   Glucose, Bld 120 (*)    BUN 6 (*)    All other components within normal limits  CBC - Abnormal; Notable for the  following components:   WBC 11.9 (*)    Platelets 458 (*)    All other components within normal limits  TROPONIN I (HIGH SENSITIVITY)  TROPONIN I (HIGH SENSITIVITY)    EKG EKG Interpretation  Date/Time:  Monday May 16 2019 21:09:13 EDT Ventricular Rate:  98 PR Interval:  156 QRS Duration: 76 QT Interval:  362 QTC Calculation: 462 R Axis:   74 Text Interpretation:  Normal sinus rhythm Cannot rule out Anterior infarct , age undetermined Abnormal ECG Confirmed by Orpah Greek [phone]) on 05/17/2019 12:12:22 AM   Radiology Dg Chest 2 View  Result Date: 05/16/2019 CLINICAL DATA:  Pt c/o central chest pain and hypertension x 1 day. Hx of NSTEMI in 08/2018, pulmonary embolism, HTN, AND CAD. Pt is a current smoker. EXAM: CHEST - 2 VIEW COMPARISON:  Chest radiograph 09/28/2018, 09/12/2018 FINDINGS: Stable cardiomediastinal contours with normal heart size. The lungs are clear. No pneumothorax or pleural effusion. No acute finding in the visualized skeleton. IMPRESSION: No acute cardiopulmonary process. Electronically Signed   By: Audie Pinto M.D.   On: 05/16/2019 21:35    Procedures Procedures (including critical care time)  Medications Ordered in ED Medications  sodium chloride flush (NS) 0.9 % injection 3 mL (3 mLs Intravenous Given 05/17/19 0105)  labetalol (NORMODYNE) injection 20 mg (20 mg Intravenous Given 05/17/19 0105)  amLODipine (NORVASC) tablet 5 mg (5 mg Oral Given 05/17/19 0247)  cloNIDine (CATAPRES) tablet 0.2 mg (0.2 mg Oral Given 05/17/19 0247)     Initial Impression / Assessment and Plan / ED Course  I have reviewed the triage vital signs and the nursing notes.  Pertinent labs & imaging results that were available during my care of the patient were reviewed by me and considered in my medical decision making (see chart for details).        Patient presents to the emergency department for evaluation of chest pain.  Pain was very atypical in nature.   Patient had several episodes of very sharp pains in the left chest that only lasted for 2 seconds at a time.  Pain is not present at arrival.  Cardiac evaluation unremarkable.  Patient was very hypertensive at arrival.  This resolved with treatment in the ER.  As she has had 2- troponins and pain was very atypical, will discharge and have follow-up with cardiology to recheck blood pressure.  Final Clinical Impressions(s) / ED Diagnoses   Final diagnoses:  Essential hypertension    ED Discharge Orders         Ordered    amLODipine (NORVASC) 5 MG tablet  Daily PRN     05/17/19 0350           Orpah Greek, MD 05/18/19 [phone]

## 2019-05-17 NOTE — Discharge Instructions (Signed)
Check your blood pressure daily.  If it is elevated (more than 140/90) take an additional half tablet of Coreg.  If it stays elevated after taking the extra Coreg, take the amlodipine.  If your blood pressure gets very high like it did today, return to the ER.  Please schedule follow-up with your primary doctor or cardiologist as soon as possible for repeat blood pressure check.

## 2019-05-19 ENCOUNTER — Other Ambulatory Visit: Payer: Self-pay

## 2019-05-19 ENCOUNTER — Encounter (HOSPITAL_COMMUNITY): Payer: Self-pay

## 2019-05-19 ENCOUNTER — Emergency Department (HOSPITAL_COMMUNITY)
Admission: EM | Admit: 2019-05-19 | Discharge: 2019-05-19 | Disposition: A | Discharge status: Home / Self Care | Payer: BC Managed Care – PPO | Attending: Emergency Medicine | Admitting: Emergency Medicine

## 2019-05-19 ENCOUNTER — Telehealth: Payer: Self-pay | Admitting: Cardiology

## 2019-05-19 DIAGNOSIS — I2510 Atherosclerotic heart disease of native coronary artery without angina pectoris: Secondary | ICD-10-CM | POA: Insufficient documentation

## 2019-05-19 DIAGNOSIS — M3210 Systemic lupus erythematosus, organ or system involvement unspecified: Secondary | ICD-10-CM | POA: Insufficient documentation

## 2019-05-19 DIAGNOSIS — Z7901 Long term (current) use of anticoagulants: Secondary | ICD-10-CM | POA: Diagnosis not present

## 2019-05-19 DIAGNOSIS — I252 Old myocardial infarction: Secondary | ICD-10-CM | POA: Diagnosis not present

## 2019-05-19 DIAGNOSIS — Z79899 Other long term (current) drug therapy: Secondary | ICD-10-CM | POA: Insufficient documentation

## 2019-05-19 DIAGNOSIS — Z86711 Personal history of pulmonary embolism: Secondary | ICD-10-CM | POA: Insufficient documentation

## 2019-05-19 DIAGNOSIS — F17210 Nicotine dependence, cigarettes, uncomplicated: Secondary | ICD-10-CM | POA: Insufficient documentation

## 2019-05-19 DIAGNOSIS — I10 Essential (primary) hypertension: Secondary | ICD-10-CM | POA: Insufficient documentation

## 2019-05-19 DIAGNOSIS — Z86718 Personal history of other venous thrombosis and embolism: Secondary | ICD-10-CM | POA: Diagnosis not present

## 2019-05-19 LAB — COMPREHENSIVE METABOLIC PANEL
ALT: 16 U/L (ref 0–44)
AST: 27 U/L (ref 15–41)
Albumin: 3.9 g/dL (ref 3.5–5.0)
Alkaline Phosphatase: 108 U/L (ref 38–126)
Anion gap: 13 (ref 5–15)
BUN: 7 mg/dL — ABNORMAL LOW (ref 8–23)
CO2: 23 mmol/L (ref 22–32)
Calcium: 9.8 mg/dL (ref 8.9–10.3)
Chloride: 102 mmol/L (ref 98–111)
Creatinine, Ser: 0.72 mg/dL (ref 0.44–1.00)
GFR calc Af Amer: >60 mL/min (ref >60)
GFR calc non Af Amer: >60 mL/min (ref >60)
Glucose, Bld: 90 mg/dL (ref 70–99)
Potassium: 4 mmol/L (ref 3.5–5.1)
Sodium: 138 mmol/L (ref 135–145)
Total Bilirubin: 1.4 mg/dL — ABNORMAL HIGH (ref 0.3–1.2)
Total Protein: 7.8 g/dL (ref 6.5–8.1)

## 2019-05-19 LAB — CBC WITH DIFFERENTIAL/PLATELET
Abs Immature Granulocytes: 0.05 10*3/uL (ref 0.00–0.07)
Basophils Absolute: 0 10*3/uL (ref 0.0–0.1)
Basophils Relative: 0 %
Eosinophils Absolute: 0.1 10*3/uL (ref 0.0–0.5)
Eosinophils Relative: 1 %
HCT: 42.9 % (ref 36.0–46.0)
Hemoglobin: 14 g/dL (ref 12.0–15.0)
Immature Granulocytes: 0 %
Lymphocytes Relative: 20 %
Lymphs Abs: 2.2 10*3/uL (ref 0.7–4.0)
MCH: 29.9 pg (ref 26.0–34.0)
MCHC: 32.6 g/dL (ref 30.0–36.0)
MCV: 91.7 fL (ref 80.0–100.0)
Monocytes Absolute: 0.7 10*3/uL (ref 0.1–1.0)
Monocytes Relative: 6 %
Neutro Abs: 8.1 10*3/uL — ABNORMAL HIGH (ref 1.7–7.7)
Neutrophils Relative %: 73 %
Platelets: 479 10*3/uL — ABNORMAL HIGH (ref 150–400)
RBC: 4.68 MIL/uL (ref 3.87–5.11)
RDW: 12.8 % (ref 11.5–15.5)
WBC: 11.3 10*3/uL — ABNORMAL HIGH (ref 4.0–10.5)
nRBC: 0 % (ref 0.0–0.2)

## 2019-05-19 LAB — BRAIN NATRIURETIC PEPTIDE: B Natriuretic Peptide: 39.5 pg/mL (ref 0.0–100.0)

## 2019-05-19 LAB — PROTIME-INR
INR: 1.2 (ref 0.8–1.2)
Prothrombin Time: 15.4 seconds — ABNORMAL HIGH (ref 11.4–15.2)

## 2019-05-19 LAB — TROPONIN I (HIGH SENSITIVITY): Troponin I (High Sensitivity): 10 ng/L (ref <18)

## 2019-05-19 MED ORDER — AMLODIPINE BESYLATE 5 MG PO TABS: 10.0000 mg | ORAL_TABLET | Freq: Every day | ORAL | 0 refills | Status: DC

## 2019-05-19 MED ORDER — HYDRALAZINE HCL 20 MG/ML IJ SOLN
10.0000 mg | Freq: Once | INTRAMUSCULAR | Status: DC
  Filled 2019-05-19: qty 1

## 2019-05-19 MED ORDER — LABETALOL HCL 5 MG/ML IV SOLN
20.0000 mg | Freq: Once | INTRAVENOUS | Status: AC
  Administered 2019-05-19: 20 mg via INTRAVENOUS
  Filled 2019-05-19: qty 4

## 2019-05-19 NOTE — Telephone Encounter (Signed)
  Patient BP is 224/97 today and she has taken her lisinopril, Coreg and amlodipine as directed by the ER doctor but her blood pressure is still running high. Please advise.

## 2019-05-19 NOTE — ED Provider Notes (Signed)
[facility] EMERGENCY DEPARTMENT Provider Note   CSN: [id number] Arrival date & time: 05/19/19  1317     History   Chief Complaint Chief Complaint  Patient presents with  . Hypertension    HPI [name] [name] is a 63 y.o. female.     HPI The patient is a 23 year olf female with a hx of CAD s/p stent placement x2 in Jan 2020, DVT and PE (on Xarelto), HTN, Lupus anticoagulant syndrome who presents to the ED for asymptomatic hypertension.   Per the patient, she took her BP at home and found it to be elevated to the 269S systolic. She was seen in the ED on 9/28 for the same, with an additional symptom of chest pain at the time. She was discharged following normal labs with a plan to follow-up with cardiology.   Today, she denies headaches, vision changes, chest pain, palpitations, nausea, vomiting, diaphoresis, shortness of breath, abdominal pain, dysuria, flank pain. She is concern about her blood pressure given her cardiac history.  Past Medical History:  Diagnosis Date  . Back pain   . CAD S/P percutaneous coronary angioplasty 09/13/2018   09/13/2018 - Cath & Staged DES PCI on 09/14/2018: DES PCI: Cx99%&55% - Resolute Onyx DES 3.5 x 30 (3.6 mm), p-mLAD (prox of D1 almost to D2) - Resolute Onyx DES 3.0 x 22 (3.3 mm); DFR on pRCA 70% - 0.99, Not significant -> Rec Med Rx.  . DVT (deep venous thrombosis) ([place])   . Heart attack ([place]) 09/09/2018  . Hypertension   . Lupus anticoagulant syndrome ([place])   . NSTEMI (non-ST elevated myocardial infarction) ([place]) 09/12/2018   Initial presentation with chest pain was on 09/09/2018, recurrent pain on 1/26 2020 with positive troponins.  Cath showed three-vessel CAD -> declined CABG, opted for two-vessel DES PCI (LAD and CX, negative DFR RCA)  . Pulmonary embolism [facility])     Patient Active Problem List   Diagnosis Date Noted  . Heme positive stool   . Antiplatelet or antithrombotic long-term use   . Acute on chronic  anemia 09/28/2018  . CAD -S/P PCI 09/24/2018  . Non-ST elevation (NSTEMI) myocardial infarction ([place])   . History of pulmonary embolus (PE) 09/12/2018  . GERD (gastroesophageal reflux disease) 12/25/2016  . Microcytic anemia 01/24/2016  . Hypertriglyceridemia 01/24/2016  . Dyslipidemia, goal LDL below 70 06/21/2015  . History of DVT of lower extremity   . Edema of left lower extremity   . Hypercoagulation syndrome ([place]) 05/19/2012  . Iron deficiency 05/19/2012  . Thrombocytosis ([place]) 05/19/2012  . Acute lower GI bleeding 05/18/2012  . Sinus tachycardia 04/21/2012  . Hypokalemia 04/20/2012  . Tobacco use 04/20/2012  . Essential hypertension 04/20/2012    Past Surgical History:  Procedure Laterality Date  . BACK SURGERY    . COLONOSCOPY  05/19/2012   Procedure: COLONOSCOPY;  Surgeon: Beryle Beams, MD;  Location: [facility];  Service: Endoscopy;  Laterality: N/A;  . COLONOSCOPY WITH PROPOFOL N/A 09/30/2018   Procedure: COLONOSCOPY WITH PROPOFOL;  Surgeon: Yetta Flock, MD;  Location: [place];  Service: Gastroenterology;  Laterality: N/A;  . CORONARY STENT INTERVENTION N/A 09/14/2018   Procedure: CORONARY STENT INTERVENTION;  Surgeon: Martinique, Peter M, MD;  Location: [facility] CV LAB;  Service: Cardiovascular: September 14, 2018- DES PCI: Cx99%&55% - Resolute Onyx DES 3.5 x 30 (3.6 mm), p-mLAD (prox of D1 almost to D2) - Resolute Onyx DES 3.0 x 22 (3.3 mm); DFR on pRCA 70% -  0.99, Not significant -> Rec Med Rx.  . ESOPHAGOGASTRODUODENOSCOPY  05/19/2012   Procedure: ESOPHAGOGASTRODUODENOSCOPY (EGD);  Surgeon: Beryle Beams, MD;  Location: [facility] ENDOSCOPY;  Service: Endoscopy;  Laterality: N/A;  . ESOPHAGOGASTRODUODENOSCOPY (EGD) WITH PROPOFOL N/A 09/30/2018   Procedure: ESOPHAGOGASTRODUODENOSCOPY (EGD) WITH PROPOFOL;  Surgeon: Yetta Flock, MD;  Location: [place];  Service: Gastroenterology;  Laterality: N/A;  . GIVENS CAPSULE STUDY N/A 09/30/2018   Procedure: GIVENS  CAPSULE STUDY;  Surgeon: Yetta Flock, MD;  Location: [facility];  Service: Gastroenterology;  Laterality: N/A;  . HOT HEMOSTASIS N/A 09/30/2018   Procedure: HOT HEMOSTASIS (ARGON PLASMA COAGULATION/BICAP);  Surgeon: Yetta Flock, MD;  Location: [facility] ENDOSCOPY;  Service: Gastroenterology;  Laterality: N/A;  . INTRAVASCULAR PRESSURE WIRE/FFR STUDY N/A 09/14/2018   Procedure: INTRAVASCULAR PRESSURE WIRE/FFR STUDY;  Surgeon: Martinique, Peter M, MD;  Location: [place] CV LAB;  Service: Cardiovascular;  Laterality: RCA: DFR on pRCA 70% - 0.99, Not significant -> Rec Med Rx.  Marland Kitchen LEFT HEART CATH AND CORONARY ANGIOGRAPHY N/A 09/13/2018   Procedure: LEFT HEART CATH AND CORONARY ANGIOGRAPHY;  Surgeon: Leonie Man, MD;  Location: [facility] CV LAB;  Service: Cardiovascular:: CULPRIT LESION 99% p-mCx followed by 55%mCx; pLAD 35% A D104fllowed by long 80% lesion @ SP1); pRCA 70%. Normal LVEDP. Global HK EF ~45%.  -Decision on PCI deferred until discussion about PCI versus CABG.  Concern because of long-term DOAC.  .Marland KitchenTRANSTHORACIC ECHOCARDIOGRAM  09/13/2018   Mildly reduced EF 45 to 50% with diffuse HK.  GR 1 DD.  Mild aortic valve calcification.  MAC.  Moderate pulmonic regurgitation.     OB History   No obstetric history on file.      Home Medications    Prior to Admission medications   Medication Sig Start Date End Date Taking? Authorizing Provider  carvedilol (COREG) 6.25 MG tablet Take 3.125 mg by mouth 2 (two) times daily with a meal.    Yes [provider]  clopidogrel (PLAVIX) 75 MG tablet TAKE 1 TABLET BY MOUTH EVERY DAY Patient taking differently: Take 37.5 mg by mouth once.  04/12/19  Yes HLeonie Man MD  ferrous sulfate 325 (65 FE) MG tablet Take 1 tablet (325 mg total) by mouth 2 (two) times daily. 12/06/18  Yes Armbruster, SCarlota Raspberry MD  lisinopril (ZESTRIL) 40 MG tablet Take 1 tablet (40 mg total) by mouth daily. 03/17/19  Yes HLeonie Man MD  nitroGLYCERIN  (NITROSTAT) 0.4 MG SL tablet Place 1 tablet (0.4 mg total) under the tongue every 5 (five) minutes as needed for up to 3 doses for chest pain. 09/15/18  Yes PElodia Florence, MD  rivaroxaban (XARELTO) 20 MG TABS tablet Take 1 tablet (20 mg total) by mouth daily. Patient taking differently: Take 20 mg by mouth daily with supper.  01/17/19  Yes BBillie Ruddy MD  amLODipine (NORVASC) 5 MG tablet Take 2 tablets (10 mg total) by mouth daily. 05/19/19   SGareth Morgan MD  carvedilol (COREG) 6.25 MG tablet Take 0.5 tablets (3.125 mg total) by mouth 2 (two) times daily with a meal for 30 days. 12/29/18 01/28/19  HLeonie Man MD    Family History Family History  Problem Relation Age of Onset  . Leukemia Mother   . Prostate cancer Father   . Cancer Father   . Stroke Maternal Grandmother   . Lung cancer Maternal Grandfather   . Leukemia Paternal Grandmother   . Cancer Paternal Grandfather   .  Spina bifida Son        Multiple surgeries  . Colon cancer Neg Hx   . Esophageal cancer Neg Hx     Social History Social History   Tobacco Use  . Smoking status: Current Every Day Smoker    Packs/day: 0.50    Years: 29.00    Pack years: 14.50    Types: Cigarettes  . Smokeless tobacco: Never Used  Substance Use Topics  . Alcohol use: Yes    Comment: Occasional  . Drug use: No     Allergies   Prednisone, Tramadol, Crestor [rosuvastatin calcium], and Hctz [hydrochlorothiazide]   Review of Systems Review of Systems  Constitutional: Negative for chills and fever.  HENT: Negative for ear pain and sore throat.   Eyes: Negative for pain and visual disturbance.  Respiratory: Negative for cough and shortness of breath.   Cardiovascular: Negative for chest pain and palpitations.  Gastrointestinal: Negative for abdominal pain and vomiting.  Genitourinary: Negative for dysuria and hematuria.  Musculoskeletal: Negative for arthralgias and back pain.  Skin: Negative for color change and  rash.  Neurological: Negative for seizures and syncope.  All other systems reviewed and are negative.    Physical Exam Updated Vital Signs BP (!) 170/78   Pulse 76   Temp 98.3 F (36.8 C) (Oral)   Resp 18   Ht 5' 4.5" (1.638 m)   Wt 71.7 kg   SpO2 98%   BMI 26.70 kg/m   Physical Exam Vitals signs and nursing note reviewed.  Constitutional:      General: She is not in acute distress.    Appearance: She is well-developed. She is obese. She is not ill-appearing or diaphoretic.  HENT:     Head: Normocephalic and atraumatic.  Eyes:     Conjunctiva/sclera: Conjunctivae normal.  Neck:     Musculoskeletal: Neck supple.  Cardiovascular:     Rate and Rhythm: Normal rate and regular rhythm.     Heart sounds: No murmur.  Pulmonary:     Effort: Pulmonary effort is normal. No respiratory distress.     Breath sounds: Normal breath sounds.  Abdominal:     Palpations: Abdomen is soft.     Tenderness: There is no abdominal tenderness.  Skin:    General: Skin is warm and dry.  Neurological:     Mental Status: She is alert.      ED Treatments / Results  Labs (all labs ordered are listed, but only abnormal results are displayed) Labs Reviewed  COMPREHENSIVE METABOLIC PANEL - Abnormal; Notable for the following components:      Result Value   BUN 7 (*)    Total Bilirubin 1.4 (*)    All other components within normal limits  CBC WITH DIFFERENTIAL/PLATELET - Abnormal; Notable for the following components:   WBC 11.3 (*)    Platelets 479 (*)    Neutro Abs 8.1 (*)    All other components within normal limits  PROTIME-INR - Abnormal; Notable for the following components:   Prothrombin Time 15.4 (*)    All other components within normal limits  BRAIN NATRIURETIC PEPTIDE  TROPONIN I (HIGH SENSITIVITY)    EKG EKG Interpretation  Date/Time:  Thursday May 19 2019 17:17:49 EDT Ventricular Rate:  95 PR Interval:    QRS Duration: 111 QT Interval:  354 QTC Calculation: 445  R Axis:   70 Text Interpretation:  Sinus rhythm Borderline ST depression, diffuse leads Similar changes to previous ECG days ago Confirmed by  Gareth Morgan [id number]) on 05/19/2019 6:09:35 PM   Radiology No results found.  Procedures Procedures (including critical care time)  Medications Ordered in ED Medications  labetalol (NORMODYNE) injection 20 mg (20 mg Intravenous Given 05/19/19 1803)     Initial Impression / Assessment and Plan / ED Course  I have reviewed the triage vital signs and the nursing notes.  Pertinent labs & imaging results that were available during my care of the patient were reviewed by me and considered in my medical decision making (see chart for details).  Clinical Course as of May 19 544  Thu May 19, 2019  1853 BP(!): 193/85 [JL]  1853 BP(!): 148/71 [JL]    Clinical Course User Index [JL] Regan Lemming, MD       On arrival, the patient was afebrile and HDS, concerned about hypertension with a BP of 193/85. She was otherwise asymptomatic. An EKG revealed borderline ST depression as above but unchanged from prior. Labs unremarkable to include a troponin of 10. The patient was administered 51m of Labetalol in the ED with significant improvement in her blood pressure. She remained asymptomatic in the ED and was deemed stable for discharge with a plan to follow-up with her PCP or cardiologist as needed for outpatient BP medication titration.  Final Clinical Impressions(s) / ED Diagnoses   Final diagnoses:  Essential hypertension    ED Discharge Orders         Ordered    amLODipine (NORVASC) 5 MG tablet  Daily     05/19/19 2043           LRegan Lemming MD 05/20/19 [id number]   SGareth Morgan MD 05/20/19 2308

## 2019-05-19 NOTE — Telephone Encounter (Signed)
Spoke to pt who report she was seen in the ED a couple days ago for elevated BP. She report she was discharged with instructions to take and additional 1/2 table of coreg if BP is elevated and if it doesn't decrease to take amlodipine 5 mg. Pt report Tuesday her BP was normal but yesterday and today it has been elevated. She report she took her reg dose and additional dose of coreg. BP remained elevated at 189/80. Pt report she took amlodipine and has since rechecked but BP is increasing. She report BP is currently 224/97. Pt denies any symptoms.  Pt instructed to report to ED for further evaluations. Pt voiced understanding.

## 2019-05-19 NOTE — ED Triage Notes (Signed)
Pt arrives POV for eval of HTN. Pt reports that she was here on 9/28 for same, given oral antihypertensives RX for home and cards f/u. Pt reports that she felt hypertensive this AM, checked her BP and took her BP meds as instructed here, but her BP remained high. Denies other sx at this time

## 2019-05-27 ENCOUNTER — Encounter: Payer: Self-pay | Admitting: Cardiology

## 2019-05-27 ENCOUNTER — Ambulatory Visit (INDEPENDENT_AMBULATORY_CARE_PROVIDER_SITE_OTHER): Payer: BC Managed Care – PPO | Admitting: Cardiology

## 2019-05-27 ENCOUNTER — Other Ambulatory Visit: Payer: Self-pay

## 2019-05-27 VITALS — BP 174/90 | HR 96 | Temp 98.1°F | Ht 64.5 in | Wt 156.2 lb

## 2019-05-27 DIAGNOSIS — E781 Pure hyperglyceridemia: Secondary | ICD-10-CM | POA: Diagnosis not present

## 2019-05-27 DIAGNOSIS — Z7902 Long term (current) use of antithrombotics/antiplatelets: Secondary | ICD-10-CM

## 2019-05-27 DIAGNOSIS — I2510 Atherosclerotic heart disease of native coronary artery without angina pectoris: Secondary | ICD-10-CM

## 2019-05-27 DIAGNOSIS — Z9861 Coronary angioplasty status: Secondary | ICD-10-CM | POA: Diagnosis not present

## 2019-05-27 DIAGNOSIS — D6862 Lupus anticoagulant syndrome: Secondary | ICD-10-CM

## 2019-05-27 DIAGNOSIS — I10 Essential (primary) hypertension: Secondary | ICD-10-CM

## 2019-05-27 DIAGNOSIS — F418 Other specified anxiety disorders: Secondary | ICD-10-CM | POA: Insufficient documentation

## 2019-05-27 DIAGNOSIS — E785 Hyperlipidemia, unspecified: Secondary | ICD-10-CM | POA: Diagnosis not present

## 2019-05-27 DIAGNOSIS — D6859 Other primary thrombophilia: Secondary | ICD-10-CM

## 2019-05-27 DIAGNOSIS — Z720 Tobacco use: Secondary | ICD-10-CM

## 2019-05-27 MED ORDER — SPIRONOLACTONE 25 MG PO TABS: 12.5000 mg | ORAL_TABLET | ORAL | 3 refills | Status: DC

## 2019-05-27 MED ORDER — LISINOPRIL 40 MG PO TABS: 40.0000 mg | ORAL_TABLET | ORAL | 3 refills | Status: DC

## 2019-05-27 MED ORDER — AMLODIPINE BESYLATE 10 MG PO TABS: 10.0000 mg | ORAL_TABLET | Freq: Every day | ORAL | 3 refills | Status: DC

## 2019-05-27 NOTE — Patient Instructions (Addendum)
Medication Instructions:  CHANGE IN MEDICATIONS  - SPIRONOLACTONE 12.5 MG ( 1/2 TABLET OF 25 MG) IN THE MORNING WITH LISNOPRIL.  FOR TONIGHT  TAKE 5 MG  AMLODIPINE  TOMORROW MORNING TAKE 5 MG AMLODIPINE WITH SPRIONOLACTONE THEN TOMORROW TONIGHT TAKE 2 TABLET ( 5MG  AMLODIPINE  THEN STOP -STARTING ON Sunday 05/29/19  TAKING 10 MG AMLODIPINE AT BEDTIME.   If you need a refill on your cardiac medications before your next appointment, please call your pharmacy.   Lab work: LIPIDS CMP - NEXT Wednesday - FASTING  If you have labs (blood work) drawn today and your tests are completely normal, you will receive your results only by: Marland Kitchen MyChart Message (if you have MyChart) OR . A paper copy in the mail If you have any lab test that is abnormal or we need to change your treatment, we will call you to review the results.  Testing/Procedures: NOT NEEDED  Follow-Up: At [facility], you and your health needs are our priority.  As part of our continuing mission to provide you with exceptional heart care, we have created designated Provider Care Teams.  These Care Teams include your primary Cardiologist (physician) and Advanced Practice Providers (APPs -  Physician Assistants and Nurse Practitioners) who all work together to provide you with the care you need, when you need it. . You will need a follow up appointment in 3-4 MONTHS-JAN/FEB 2020.  Please call our office 2 months in advance to schedule this appointment.  You may see Glenetta Hew, MD or one of the following Advanced Practice Providers on your designated Care Team:   . Rosaria Ferries, PA-C . Jory Sims, DNP, ANP  Your physician recommends that you schedule a follow-up appointment in 2-3 WEEKS WITH CVRR - BLOOD PRESSURE , LIPIDS   Any Other Special Instructions Will Be Listed Below.  DISCUSS WITH YOUR PRIMARY - ABOUT SOMETHING FOR STRESS/ANXIETY

## 2019-05-27 NOTE — Progress Notes (Signed)
PCP: Billie Ruddy, MD  Clinic Note: Chief Complaint  Patient presents with  . Hospitalization Follow-up    Accelerated hypertension  . Coronary Artery Disease  . Hyperlipidemia    With hypertriglyceridemia   HPI:    [name] [name] is a 63 y.o. female with a h/o CAD-PCI in the setting of non-STEMI in January 2020, recurrent DVT-PE (2/2 Lupus anticoagulant) recently complicated by GI bleed issues (September 27, 2018) on antiplatelet agent plus anticoagulation.  She now presents 4 months after last visit for ER follow-up for accelerated hypertension.  --> She has had issues with multiple different medications making it very difficult to manage.  Most notably she has been intolerant of just about anything that we have tried for her lipids as well as most blood pressure medications.  Lyndy Russman was last seen on 5/13 via Telemedicine --> she noted low energy and fatigue.  Not exercising.  Feeling tired all day long.  Felt much better having missed a dose of Plavix.  Removed Crestor medication list, listed as intolerance  Start Vascepa 1 g twice daily --> she took about 2 of these and stopped.  Reduce carvedilol to 1/2 tablet twice daily  Start HCTZ 25 mg daily --> she stopped this  Change Plavix dosage time to evening --now taking1/2tab  She actually had a telephone conference with [facility]  pharmacists [place] -> prior authorization for Repatha was approved.  Was also able to get prior authorization for Vascepa, that the patient stopped taking.  Recent Hospitalizations/ER visits:   September 28 -> " chest pain ".  She described his left shoulder and neck pain tingling sensation.  She had been under lots of stress.  She felt twinging type discomfort so she had her husband check her blood pressure and it was 215/1 100 mmHg.  So she went to the ER.  She denied any shortness of breath or headache with this.  No blurred vision or dizziness.  Treated with  IV labetalol 20 mg and p.o. clonidine x1.    Started on amlodipine 5 mg daily  October 1-again noted blood pressure being elevated over 790 mmHg systolic.  No symptoms of chest pain, shortness of breath or headache or blurred vision.  Blood pressure recorded as 193/80 5 mmHg. ->  Troponin normal.  Labetalol 20 mg IV  Told increase to 2 tablets of amlodipine 5 mg (10 mg total)  Reviewed  CV studies:   The following studies were reviewed today: (if available, images/films reviewed: From Epic Chart or Care Everywhere) . None:  Interval History   Annmargaret actually says that she felt a whole lot better when we reduced the carvedilol dose and she actually started taking half a tablet of clopidogrel.  She says that her family and friends and noted that she "had a return of the sparkle in her eye ", and started being more like herself.  Unfortunately, she has been under quite a bit of stress with her husband recently by being diagnosed with and treated for cancer now status post resection.  Apparently this took a long time to get diagnosed and then treated.  Partly because of her involvement in his care, and the COVID-19 issues, she has not been able to exercise as much she would like to.  She says that probably the issue with the last 2 ER visits had to do with her being under somewhat stress.  She says that since starting on the higher dose of amlodipine,  she initially started feeling, strange but has gotten used to it and her blood pressures have been okay.  Actually with exception of all the stress and anxiety that this is been going through she feels much better now than she did when I last saw her via telehealth.  She asked about when she could stop Plavix (incorrectly assuming that I had said that she can stop it at 74month which was not part of our initial discussion -> the comment was that it could be held for procedures at 6 months).  Thankfully, she has not had any more bleeding issues with any  melena, hematochezia or hematuria.  She does continue to take her iron tablets.  Besides the episode where she went to the ER with chest pain in the setting of hypertension, she has not had any further chest pain or pressure with rest or exertion.  Energy level seems to be better, but has not yet gotten back into doing any exercise. As such, it is really difficult to tell if she is having any exertional chest pain or dyspnea, however despite having significant hypertension, she denied any chest pain or dyspnea..  Cardiovascular review of symptoms: No PND, orthopnea or edema.  No palpitations, lightheadedness, dizziness, weakness or syncope/near syncope. No TIA/amaurosis fugax symptoms. No melena, hematochezia, hematuria, or epstaxis. No claudication.   Reviewed Of Systems   ROS: A comprehensive was performed. Review of Systems  Constitutional: Positive for malaise/fatigue (Overall improved). Negative for weight loss.  HENT: Negative for congestion and nosebleeds.   Gastrointestinal: Negative for abdominal pain, constipation, heartburn, nausea and vomiting.  Musculoskeletal: Positive for joint pain. Negative for falls.  Neurological: Positive for dizziness (Initially felt funny when she took the second dose of amlodipine, but now better) and weakness (Generalized weakness seems to have improved some). Negative for focal weakness.  Endo/Heme/Allergies: Bruises/bleeds easily.  Psychiatric/Behavioral: Negative for depression (I am not sure how much of this is true depression versus dysthymia.  But also seems to be a little bit of buildup of stress related to her husband's health and her health plus COVID). The patient is nervous/anxious and has insomnia (Not sleeping well.).   All other systems reviewed and are negative.   I have reviewed and (if needed) personally updated the patient's problem list, medications, allergies, past medical and surgical history, social and family history.   Past  Medical History   Past Medical History:  Diagnosis Date  . Back pain   . CAD S/P percutaneous coronary angioplasty 09/13/2018   09/13/2018 - Cath & Staged DES PCI on 09/14/2018: DES PCI: Cx99%&55% - Resolute Onyx DES 3.5 x 30 (3.6 mm), p-mLAD (prox of D1 almost to D2) - Resolute Onyx DES 3.0 x 22 (3.3 mm); DFR on pRCA 70% - 0.99, Not significant -> Rec Med Rx.  . DVT (deep venous thrombosis) ([facility]   . Heart attack ([facility] 09/09/2018  . Hypertension   . Lupus anticoagulant syndrome ([facility]   . NSTEMI (non-ST elevated myocardial infarction) ([facility] 09/12/[place]   Initial presentation with chest pain was on 09/09/2018, recurrent pain on 1/26 2020 with positive troponins.  Cath showed three-vessel CAD -> declined CABG, opted for two-vessel DES PCI (LAD and CX, negative DFR RCA)  . Pulmonary embolism ([facility]     Past Surgical History   Past Surgical History:  Procedure Laterality Date  . BACK SURGERY    . COLONOSCOPY  05/19/2012   Procedure: COLONOSCOPY;  Surgeon: PBeryle Beams MD;  [place]  Service: Endoscopy;  Laterality: N/A;  . COLONOSCOPY WITH PROPOFOL N/A 09/30/2018   Procedure: COLONOSCOPY WITH PROPOFOL;  Surgeon: Yetta Flock, MD;  Location: [place];  Service: Gastroenterology;  Laterality: N/A;  . CORONARY STENT INTERVENTION N/A 09/14/2018   Procedure: CORONARY STENT INTERVENTION;  Surgeon: Martinique, Peter M, MD;  Location: [place] CV LAB;  Service: Cardiovascular: September 14, 2018- DES PCI: Cx99%&55% - Resolute Onyx DES 3.5 x 30 (3.6 mm), p-mLAD (prox of D1 almost to D2) - Resolute Onyx DES 3.0 x 22 (3.3 mm); DFR on pRCA 70% - 0.99, Not significant -> Rec Med Rx.  . ESOPHAGOGASTRODUODENOSCOPY  05/19/2012   Procedure: ESOPHAGOGASTRODUODENOSCOPY (EGD);  Surgeon: Beryle Beams, MD;  Location: [facility] ENDOSCOPY;  Service: Endoscopy;  Laterality: N/A;  . ESOPHAGOGASTRODUODENOSCOPY (EGD) WITH PROPOFOL N/A 09/30/2018   Procedure: ESOPHAGOGASTRODUODENOSCOPY (EGD) WITH PROPOFOL;   Surgeon: Yetta Flock, MD;  Location: [place];  Service: Gastroenterology;  Laterality: N/A;  . GIVENS CAPSULE STUDY N/A 09/30/2018   Procedure: GIVENS CAPSULE STUDY;  Surgeon: Yetta Flock, MD;  Location: [place];  Service: Gastroenterology;  Laterality: N/A;  . HOT HEMOSTASIS N/A 09/30/2018   Procedure: HOT HEMOSTASIS (ARGON PLASMA COAGULATION/BICAP);  Surgeon: Yetta Flock, MD;  Location: [facility] ENDOSCOPY;  Service: Gastroenterology;  Laterality: N/A;  . INTRAVASCULAR PRESSURE WIRE/FFR STUDY N/A 09/14/2018   Procedure: INTRAVASCULAR PRESSURE WIRE/FFR STUDY;  Surgeon: Martinique, Peter M, MD;  Location: [place] CV LAB;  Service: Cardiovascular;  Laterality: RCA: DFR on pRCA 70% - 0.99, Not significant -> Rec Med Rx.  Marland Kitchen LEFT HEART CATH AND CORONARY ANGIOGRAPHY N/A 09/13/2018   Procedure: LEFT HEART CATH AND CORONARY ANGIOGRAPHY;  Surgeon: Leonie Man, MD;  Location: [place] CV LAB;  Service: Cardiovascular:: CULPRIT LESION 99% p-mCx followed by 55%mCx; pLAD 35% A D57fllowed by long 80% lesion @ SP1); pRCA 70%. Normal LVEDP. Global HK EF ~45%.  -Decision on PCI deferred until discussion about PCI versus CABG.  Concern because of long-term DOAC.  .Marland KitchenTRANSTHORACIC ECHOCARDIOGRAM  09/13/2018   Mildly reduced EF 45 to 50% with diffuse HK.  GR 1 DD.  Mild aortic valve calcification.  MAC.  Moderate pulmonic regurgitation.   Diagnostic 1/27   Intervention 1/28: PCI Cx & LAD, RCA DFR negative.    Medications/Allergies   Current Meds  Medication Sig  . carvedilol (COREG) 6.25 MG tablet Take 3.125 mg by mouth 2 (two) times daily with a meal.   . clopidogrel (PLAVIX) 75 MG tablet Take 37.5 mg by mouth daily.  . ferrous sulfate 325 (65 FE) MG tablet Take 1 tablet (325 mg total) by mouth 2 (two) times daily.  .Marland Kitchenlisinopril (ZESTRIL) 40 MG tablet Take 1 tablet (40 mg total) by mouth every morning.  . nitroGLYCERIN (NITROSTAT) 0.4 MG SL tablet Place 1 tablet (0.4 mg total)  under the tongue every 5 (five) minutes as needed for up to 3 doses for chest pain.  . pantoprazole (PROTONIX) 20 MG tablet Take 20 mg by mouth daily.  . rivaroxaban (XARELTO) 20 MG TABS tablet Take 20 mg by mouth daily with supper.  . [DISCONTINUED] amLODipine (NORVASC) 5 MG tablet Take 2 tablets (10 mg total) by mouth daily.  . [DISCONTINUED] lisinopril (ZESTRIL) 40 MG tablet Take 1 tablet (40 mg total) by mouth daily.    Allergies  Allergen Reactions  . Prednisone Other (See Comments)    No energy "stalls out".    Makes pt  Jittery.  . Tramadol Other (See Comments)  Constipation,  Makes pt  Jittery.  . Crestor [Rosuvastatin Calcium]     Feels fatigued, RE  CHALLENGE - UNABLE TO USE 12/28/18  . Hctz [Hydrochlorothiazide]     Dizzy     Social History/Family History   Social History   Tobacco Use  . Smoking status: Current Every Day Smoker    Packs/day: 0.50    Years: 29.00    Pack years: 14.50    Types: Cigarettes  . Smokeless tobacco: Never Used  Substance Use Topics  . Alcohol use: Yes    Comment: Occasional  . Drug use: No   Social History   Social History Narrative   Really is a married mother of 2.  1 son died from complications of spina bifida after multiple surgeries.   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     She has a has a Oceanographer in education.     Drinks social alcohol & denies drug use.     Pt endorses smoking maybe 2 cigarettes/day.  States she placed most of the cigarette, may have 2 puffs.    family history includes Cancer in her father and paternal grandfather; Leukemia in her mother and paternal grandmother; Lung cancer in her maternal grandfather; Prostate cancer in her father; Spina bifida in her son; Stroke in her maternal grandmother.  Wt Readings from Last 3 Encounters:  05/27/19 156 lb 3.2 oz (70.9 kg)  05/19/19 158 lb (71.7 kg)  12/29/18 155 lb (70.3 kg)     Objctive   Physical Exam: BP (!)  174/90   Pulse 96   Temp 98.1 F (36.7 C)   Ht 5' 4.5" (1.638 m)   Wt 156 lb 3.2 oz (70.9 kg)   SpO2 99%   BMI 26.40 kg/m  Physical Exam  Constitutional: She is oriented to person, place, and time. She appears well-developed and well-nourished.  Well-groomed.  Healthy-appearing.  HENT:  Head: Normocephalic and atraumatic.  Neck: Normal range of motion. Neck supple. No hepatojugular reflux and no JVD present. Carotid bruit is not present.  Cardiovascular: Normal rate, regular rhythm, intact distal pulses and normal pulses.  Occasional extrasystoles are present. PMI is not displaced. Exam reveals no gallop and no friction rub.  Murmur (Faint 1/6 SEM at RUSB.) heard. Pulmonary/Chest: Effort normal and breath sounds normal. No respiratory distress. She has no wheezes. She has no rales. She exhibits no tenderness.  Abdominal: Soft. Bowel sounds are normal. She exhibits no distension. There is no abdominal tenderness. There is no rebound.  Musculoskeletal: Normal range of motion.        General: No edema.  Lymphadenopathy:    She has no cervical adenopathy.  Neurological: She is alert and oriented to person, place, and time.  Psychiatric: Her behavior is normal. Judgment and thought content normal.  Seems quite anxious.  A little bit down affect.  Vitals reviewed.   Adult ECG Report  Rate: 91 ;  Rhythm: normal sinus rhythm and Nonspecific ST and T wave changes.;  Normal axis, intervals durations.  Narrative Interpretation: Relatively normal EKG.  Recent Labs:   Lab Results  Component Value Date   CHOL 266 (H) 11/25/2018   HDL 33.80 (L) 11/25/2018   LDLCALC 202 (H) 09/13/2018   LDLDIRECT 141.0 11/25/2018   TRIG (H) 11/25/2018    410.0 Triglyceride is over 400; calculations on Lipids are invalid.   CHOLHDL 8 11/25/2018   Lab Results  Component Value Date  CREATININE 0.72 05/19/2019   BUN 7 (L) 05/19/2019   NA 138 05/19/2019   K 4.0 05/19/2019   CL 102 05/19/2019   CO2 23  05/19/2019   CBC Latest Ref Rng & Units 05/19/2019 05/16/2019 01/26/2019  WBC 4.0 - 10.5 K/uL 11.3(H) 11.9(H) 8.8  Hemoglobin 12.0 - 15.0 g/dL 14.0 13.0 11.8  Hematocrit 36.0 - 46.0 % 42.9 40.0 36.4  Platelets 150 - 400 K/uL 479(H) 458(H) 455(H)    Assessment / Plan    Problem List Items Addressed This Visit    Dyslipidemia, goal LDL below 70 (Chronic)    Did not tolerate initial dose of 40 mg Crestor.  Had previously been on atorvastatin.  We reduced Crestor already on 10 mg and she still did not tolerate.  Attempted initiation of Vascepa because of hypertriglyceridemia did not get started beyond the first couple doses.  Prior authorization had been sent.  Prior authorization for Repatha apparently was done back in June by [place], RP H from [facility]. --I will refer her back to CV [facility] to assist with lipid management as well as hypertension.      Relevant Medications   rivaroxaban (XARELTO) 20 MG TABS tablet   lisinopril (ZESTRIL) 40 MG tablet   amLODipine (NORVASC) 10 MG tablet   spironolactone (ALDACTONE) 25 MG tablet   Other Relevant Orders   Lipid panel   Comprehensive metabolic panel   Tobacco use (Chronic)    With all the issues that are going on as far as anxiety, medication adjustments, we did not have time to discuss more than just that she needs to stop smoking.  Did not seem to be interested in talking about it much.      Essential hypertension (Chronic)    Continuing current meds   Consolidate Amlodipine to 10 mg po PM  Add Spironolactone 12.5 mg  Refer to [facility] HTN Clinic      Relevant Medications   rivaroxaban (XARELTO) 20 MG TABS tablet   lisinopril (ZESTRIL) 40 MG tablet   amLODipine (NORVASC) 10 MG tablet   spironolactone (ALDACTONE) 25 MG tablet   Hypercoagulation syndrome (HCC) (Chronic)   Hypertriglyceridemia (Chronic)    Attempt at [facility] Rx did not last long - will  defer titration & other options to Lipid Clinic (may require formal consult with Dr. Debara Pickett).      Relevant Medications   rivaroxaban (XARELTO) 20 MG TABS tablet   lisinopril (ZESTRIL) 40 MG tablet   amLODipine (NORVASC) 10 MG tablet   spironolactone (ALDACTONE) 25 MG tablet   Other Relevant Orders   Lipid panel   Comprehensive metabolic panel   CAD -S/P PCI - Primary (Chronic)    S/p two-vessel PCI to LAD and LCx with residual moderate disease in the RCA (negative DFR).  Thankfully, despite having multiple other issues including GI bleed/anemia as well as decelerate hypertension, she has not had any real recurrent anginal symptoms.  Plan:   Would like to continue uninterrupted Plavix (eat even if at one half dose) until January 2021.  After 1 year we will stop per patient request and simply continue Xarelto. -->  I did explain to her the risk of stent thrombosis with taking subtherapeutic dose Plavix, however she feels that her symptoms of fatigue etc. are much better with being on the lower dose and therefore would not take a higher dose.  She understands the risks, in fact was desiring stopping it.  Has  not tolerated higher doses of beta-blocker because of fatigue   After 2 visits to the ER for accelerated hypertension, amlodipine added which we will consolidate to 10 mg daily.  Did not tolerate statins.  Would not take Vascepa.  Had prior authorization already done for Repatha.  Will refer back to [facility] who will help monitor her lipids as well as blood pressure.       Relevant Medications   rivaroxaban (XARELTO) 20 MG TABS tablet   lisinopril (ZESTRIL) 40 MG tablet   amLODipine (NORVASC) 10 MG tablet   spironolactone (ALDACTONE) 25 MG tablet   Other Relevant Orders   EKG 12-Lead   Lipid panel   Comprehensive metabolic panel   Accelerated hypertension    I am a little bit concerned about her having the blood pressures in the 200 range.  Currently 174/90 mmHg (@ home 160/74  mmHg). -- Did not tolerate HCTZ  Plan: May need to tolerate SBP range of 130-150 mmHg to avoid debilitating fatigue (eventually can shoot for lower levels).  Consolidate Amlodpine to 10 mg PO Daily  ON Max dose Lisinopril  Unable to titrate Carvedilol any further 2/2 fatigue   Will start Spironolatone 12.5 mg daily -- check CMP/Lipids in ~ 1 week to follow K+ levels  Will reschedule with [facility] Lipid & HTN Clinic for titration of Medications --> may need to consider Hydralazine/Nitrate & potentially convert Beta Blocker to Bystolic.      Relevant Medications   rivaroxaban (XARELTO) 20 MG TABS tablet   lisinopril (ZESTRIL) 40 MG tablet   amLODipine (NORVASC) 10 MG tablet   spironolactone (ALDACTONE) 25 MG tablet   Antiplatelet or antithrombotic long-term use   Anxiety about health    Recommend discussing SSRI/SNRI with PCP.      LA (lupus anticoagulant) disorder (HCC)    H/o DVT/PE - on long-term DOAC --> now on FeSO4 2/2 GI Bleed.  Have reduced Plavix to 1/2 tab -- will plan to stop after 1 yr post MI/PCI.        I spent a total of 66mnutes with the patient and chart review. >  50% of the time was spent in direct patient consultation.   Additional time spent with chart review (studies, outside notes, etc): 5 Total Time: 31  min  Current medicines are reviewed at length with the patient today.  (+/- concerns) asked about how long she needs to take Plavix, we discussed multiple different blood pressure medication and lipid management.  Did not tolerate Vascepa or rosuvastatin.   Patient Instructions / Medication Changes & Studies & Tests Ordered   Patient Instructions  Medication Instructions:  CHANGE IN MEDICATIONS  - SPIRONOLACTONE 12.5 MG ( 1/2 TABLET OF 25 MG) IN THE MORNING WITH LISNOPRIL.  FOR TONIGHT  TAKE 5 MG  AMLODIPINE  TOMORROW MORNING TAKE 5 MG AMLODIPINE WITH SPRIONOLACTONE THEN TOMORROW TONIGHT TAKE 2 TABLET ( 5MG AMLODIPINE  THEN STOP -STARTING ON  Sunday 05/29/19  TAKING 10 MG AMLODIPINE AT BEDTIME.   If you need a refill on your cardiac medications before your next appointment, please call your pharmacy.   Lab work: LIPIDS CMP - NEXT Wednesday - FASTING  If you have labs (blood work) drawn today and your tests are completely normal, you will receive your results only by: .Marland KitchenMyChart Message (if you have MyChart) OR . A paper copy in the mail If you have any lab test that is abnormal or we need to change your treatment, we will  call you to review the results.  Testing/Procedures: NOT NEEDED  Follow-Up: At [facility], you and your health needs are our priority.  As part of our continuing mission to provide you with exceptional heart care, we have created designated Provider Care Teams.  These Care Teams include your primary Cardiologist (physician) and Advanced Practice Providers (APPs -  Physician Assistants and Nurse Practitioners) who all work together to provide you with the care you need, when you need it. . You will need a follow up appointment in 3-4 MONTHS-JAN/FEB 2020.  Please call our office 2 months in advance to schedule this appointment.  You may see Glenetta Hew, MD or one of the following Advanced Practice Providers on your designated Care Team:   . Rosaria Ferries, PA-C . Jory Sims, DNP, ANP  Your physician recommends that you schedule a follow-up appointment in 2-3 WEEKS WITH [facility] - BLOOD PRESSURE , LIPIDS   Any Other Special Instructions Will Be Listed Below.  DISCUSS WITH YOUR PRIMARY - ABOUT SOMETHING FOR STRESS/ANXIETY    Studies Ordered:   Orders Placed This Encounter  Procedures  . Lipid panel  . Comprehensive metabolic panel  . EKG 12-Lead   l work together to provide you with the care you need, when you need it. You will need a follow up appointment in 4 months.  Please call our office 2 months in advance to schedule this appointment.  You may see Glenetta Hew, MD or one of the  following Advanced Practice Providers on your designated Care Team:  Rosaria Ferries, PA-C, Jory Sims, DNP, ANP    Glenetta Hew, M.D., M.S. Interventional Cardiologist   Pager # [phone] Phone # [phone] [address]. [place], [place] [address]   Thank you for choosing Heartcare at [facility]!!

## 2019-05-29 ENCOUNTER — Encounter: Payer: Self-pay | Admitting: Cardiology

## 2019-05-29 DIAGNOSIS — I10 Essential (primary) hypertension: Secondary | ICD-10-CM | POA: Insufficient documentation

## 2019-05-29 DIAGNOSIS — D6862 Lupus anticoagulant syndrome: Secondary | ICD-10-CM | POA: Insufficient documentation

## 2019-05-29 NOTE — Assessment & Plan Note (Signed)
Recommend discussing SSRI/SNRI with PCP.

## 2019-05-29 NOTE — Assessment & Plan Note (Signed)
Attempt at [facility] Rx did not last long - will defer titration & other options to Lipid Clinic (may require formal consult with Dr. Debara Pickett).

## 2019-05-29 NOTE — Assessment & Plan Note (Signed)
With all the issues that are going on as far as anxiety, medication adjustments, we did not have time to discuss more than just that she needs to stop smoking.  Did not seem to be interested in talking about it much.

## 2019-05-29 NOTE — Assessment & Plan Note (Addendum)
I am a little bit concerned about her having the blood pressures in the 200 range.  Currently 174/90 mmHg (@ home 160/74 mmHg). -- Did not tolerate HCTZ  Plan: May need to tolerate SBP range of 130-150 mmHg to avoid debilitating fatigue (eventually can shoot for lower levels).  Consolidate Amlodpine to 10 mg PO Daily  ON Max dose Lisinopril  Unable to titrate Carvedilol any further 2/2 fatigue   Will start Spironolatone 12.5 mg daily -- check CMP/Lipids in ~ 1 week to follow K+ levels  Will reschedule with [facility] Lipid [facility] HTN Clinic for titration of Medications --> may need to consider Hydralazine/Nitrate & potentially convert Beta Blocker to Bystolic.

## 2019-05-29 NOTE — Assessment & Plan Note (Signed)
Continuing current meds   Consolidate Amlodipine to 10 mg po PM  Add Spironolactone 12.5 mg  Refer to [facility] HTN Clinic

## 2019-05-29 NOTE — Assessment & Plan Note (Signed)
H/o DVT/PE - on long-term DOAC --> now on FeSO4 2/2 GI Bleed.  Have reduced Plavix to 1/2 tab -- will plan to stop after 1 yr post MI/PCI.

## 2019-05-29 NOTE — Assessment & Plan Note (Signed)
Did not tolerate initial dose of 40 mg Crestor.  Had previously been on atorvastatin.  We reduced Crestor already on 10 mg and she still did not tolerate.  Attempted initiation of Vascepa because of hypertriglyceridemia did not get started beyond the first couple doses.  Prior authorization had been sent.  Prior authorization for Repatha apparently was done back in June by [place], RP H from [facility]. --I will refer her back to CV [facility] to assist with lipid management as well as hypertension.

## 2019-05-29 NOTE — Assessment & Plan Note (Addendum)
S/p two-vessel PCI to LAD and LCx with residual moderate disease in the RCA (negative DFR).  Thankfully, despite having multiple other issues including GI bleed/anemia as well as decelerate hypertension, she has not had any real recurrent anginal symptoms.  Plan:   Would like to continue uninterrupted Plavix (eat even if at one half dose) until January 2021.  After 1 year we will stop per patient request and simply continue Xarelto. -->  I did explain to her the risk of stent thrombosis with taking subtherapeutic dose Plavix, however she feels that her symptoms of fatigue etc. are much better with being on the lower dose and therefore would not take a higher dose.  She understands the risks, in fact was desiring stopping it.  Has not tolerated higher doses of beta-blocker because of fatigue   After 2 visits to the ER for accelerated hypertension, amlodipine added which we will consolidate to 10 mg daily.  Did not tolerate statins.  Would not take Vascepa.  Had prior authorization already done for Repatha.  Will refer back to CV RR who will help monitor her lipids as well as blood pressure.

## 2019-05-30 ENCOUNTER — Other Ambulatory Visit: Payer: Self-pay | Admitting: Cardiology

## 2019-05-30 MED ORDER — AMLODIPINE BESYLATE 10 MG PO TABS: 10.0000 mg | ORAL_TABLET | Freq: Every day | ORAL | 0 refills | Status: DC

## 2019-05-30 NOTE — Telephone Encounter (Signed)
°*  STAT* If patient is at the pharmacy, call can be transferred to refill team.   1. Which medications need to be refilled? (please list name of each medication and dose if known)  Pt need a new prescription for Amlodipine  2. Which pharmacy/location (including street and city if local pharmacy) is medication to be sent to?  [facility] [name]  3. Do they need a 30 day or 90 day supply? 90 days and refills

## 2019-06-01 ENCOUNTER — Encounter: Payer: Self-pay | Admitting: Family Medicine

## 2019-06-01 ENCOUNTER — Other Ambulatory Visit: Payer: Self-pay

## 2019-06-01 ENCOUNTER — Ambulatory Visit (INDEPENDENT_AMBULATORY_CARE_PROVIDER_SITE_OTHER): Payer: BC Managed Care – PPO | Admitting: Family Medicine

## 2019-06-01 DIAGNOSIS — E781 Pure hyperglyceridemia: Secondary | ICD-10-CM

## 2019-06-01 DIAGNOSIS — Z9861 Coronary angioplasty status: Secondary | ICD-10-CM | POA: Diagnosis not present

## 2019-06-01 DIAGNOSIS — Z720 Tobacco use: Secondary | ICD-10-CM | POA: Diagnosis not present

## 2019-06-01 DIAGNOSIS — I2510 Atherosclerotic heart disease of native coronary artery without angina pectoris: Secondary | ICD-10-CM

## 2019-06-01 DIAGNOSIS — E785 Hyperlipidemia, unspecified: Secondary | ICD-10-CM | POA: Diagnosis not present

## 2019-06-01 DIAGNOSIS — F419 Anxiety disorder, unspecified: Secondary | ICD-10-CM

## 2019-06-01 DIAGNOSIS — I10 Essential (primary) hypertension: Secondary | ICD-10-CM

## 2019-06-01 LAB — LIPID PANEL
Chol/HDL Ratio: 13.6 ratio — ABNORMAL HIGH (ref 0.0–4.4)
Cholesterol, Total: 408 mg/dL — ABNORMAL HIGH (ref 100–199)
HDL: 30 mg/dL — ABNORMAL LOW (ref >39)
LDL Chol Calc (NIH): 201 mg/dL — ABNORMAL HIGH (ref 0–99)
Triglycerides: 769 mg/dL (ref 0–149)
VLDL Cholesterol Cal: 177 mg/dL — ABNORMAL HIGH (ref 5–40)

## 2019-06-01 LAB — COMPREHENSIVE METABOLIC PANEL
ALT: 11 IU/L (ref 0–32)
AST: 17 IU/L (ref 0–40)
Albumin/Globulin Ratio: 1.4 (ref 1.2–2.2)
Albumin: 4.4 g/dL (ref 3.8–4.8)
Alkaline Phosphatase: 124 IU/L — ABNORMAL HIGH (ref 39–117)
BUN/Creatinine Ratio: 10 — ABNORMAL LOW (ref 12–28)
BUN: 7 mg/dL — ABNORMAL LOW (ref 8–27)
Bilirubin Total: 0.2 mg/dL (ref 0.0–1.2)
CO2: 23 mmol/L (ref 20–29)
Calcium: 10.2 mg/dL (ref 8.7–10.3)
Chloride: 100 mmol/L (ref 96–106)
Creatinine, Ser: 0.69 mg/dL (ref 0.57–1.00)
GFR calc Af Amer: 106 mL/min/{1.73_m2} (ref >59)
GFR calc non Af Amer: 92 mL/min/{1.73_m2} (ref >59)
Globulin, Total: 3.2 g/dL (ref 1.5–4.5)
Glucose: 108 mg/dL — ABNORMAL HIGH (ref 65–99)
Potassium: 4.5 mmol/L (ref 3.5–5.2)
Sodium: 138 mmol/L (ref 134–144)
Total Protein: 7.6 g/dL (ref 6.0–8.5)

## 2019-06-01 MED ORDER — HYDROXYZINE HCL 25 MG PO TABS: 25.0000 mg | ORAL_TABLET | Freq: Three times a day (TID) | ORAL | 1 refills | Status: DC | PRN

## 2019-06-01 NOTE — Progress Notes (Signed)
Virtual Visit via Video Note Visit started via doxy, but converted to telephone call 2/2 connectivity issues. I connected with [name] [name] on 06/01/19 at  1:00 PM EDT by a video enabled telemedicine application 2/2 [id number] pandemic and verified that I am speaking with the correct person using two identifiers.  Location patient: home Location provider:work or home office Persons participating in the virtual visit: patient, provider  I discussed the limitations of evaluation and management by telemedicine and the availability of in person appointments. The patient expressed understanding and agreed to proceed.   HPI: Pt seen for f/u.  Pt recently seen in ED 9/28 for bp 214/ nearly 100.  Pt felt bad at work on 10/1 and was advised to go back to the ED by her Cardiologist.  Pt had f/u with Cardiology 10/9.  Pt states she has been under more stress than normal.  Her husband, [name] [name], had a GU "cancer scare".  Pt also notes stress at work and with the COVID-19 pandemic.  Pt is a Heritage manager at [employer].  Pt states her Cardiologist told her she was stressed and needs "something to take the edge off" so she should see her pcp.   Pt has not been walking for exercise.  Still taking xarelto.  Patient has not been able to tolerate several medications including beta-blockers and statins 2/2 chronic fatigue.  Pt is mostly a vegetarian, eats seafood, but not oysters.  Trying to eat more protein.  States was told Cholesterol was high by Cardiology.  ROS: See pertinent positives and negatives per HPI.  Past Medical History:  Diagnosis Date  . Back pain   . CAD S/P percutaneous coronary angioplasty 09/13/2018   09/13/2018 - Cath & Staged DES PCI on 09/14/2018: DES PCI: Cx99%&55% - Resolute Onyx DES 3.5 x 30 (3.6 mm), p-mLAD (prox of D1 almost to D2) - Resolute Onyx DES 3.0 x 22 (3.3 mm); DFR on pRCA 70% - 0.99, Not significant -> Rec Med Rx.  . DVT (deep venous thrombosis) ([place])   . Heart  attack ([place]) 09/09/2018  . Hypertension   . Lupus anticoagulant syndrome ([place])   . NSTEMI (non-ST elevated myocardial infarction) ([place]) 09/12/2018   Initial presentation with chest pain was on 09/09/2018, recurrent pain on 1/26 2020 with positive troponins.  Cath showed three-vessel CAD -> declined CABG, opted for two-vessel DES PCI (LAD and CX, negative DFR RCA)  . Pulmonary embolism [facility])     Past Surgical History:  Procedure Laterality Date  . BACK SURGERY    . COLONOSCOPY  05/19/2012   Procedure: COLONOSCOPY;  Surgeon: Beryle Beams, MD;  Location: [place];  Service: Endoscopy;  Laterality: N/A;  . COLONOSCOPY WITH PROPOFOL N/A 09/30/2018   Procedure: COLONOSCOPY WITH PROPOFOL;  Surgeon: Yetta Flock, MD;  Location: [place];  Service: Gastroenterology;  Laterality: N/A;  . CORONARY STENT INTERVENTION N/A 09/14/2018   Procedure: CORONARY STENT INTERVENTION;  Surgeon: Martinique, Peter M, MD;  Location: [place] CV LAB;  Service: Cardiovascular: September 14, 2018- DES PCI: Cx99%&55% - Resolute Onyx DES 3.5 x 30 (3.6 mm), p-mLAD (prox of D1 almost to D2) - Resolute Onyx DES 3.0 x 22 (3.3 mm); DFR on pRCA 70% - 0.99, Not significant -> Rec Med Rx.  . ESOPHAGOGASTRODUODENOSCOPY  05/19/2012   Procedure: ESOPHAGOGASTRODUODENOSCOPY (EGD);  Surgeon: Beryle Beams, MD;  Location: [facility] ENDOSCOPY;  Service: Endoscopy;  Laterality: N/A;  . ESOPHAGOGASTRODUODENOSCOPY (EGD) WITH PROPOFOL N/A 09/30/2018   Procedure: ESOPHAGOGASTRODUODENOSCOPY (EGD)  WITH PROPOFOL;  Surgeon: Yetta Flock, MD;  Location: [place];  Service: Gastroenterology;  Laterality: N/A;  . GIVENS CAPSULE STUDY N/A 09/30/2018   Procedure: GIVENS CAPSULE STUDY;  Surgeon: Yetta Flock, MD;  Location: [place];  Service: Gastroenterology;  Laterality: N/A;  . HOT HEMOSTASIS N/A 09/30/2018   Procedure: HOT HEMOSTASIS (ARGON PLASMA COAGULATION/BICAP);  Surgeon: Yetta Flock, MD;  Location[facility]  ENDOSCOPY;  Service: Gastroenterology;  Laterality: N/A;  . INTRAVASCULAR PRESSURE WIRE/FFR STUDY N/A 09/14/2018   Procedure: INTRAVASCULAR PRESSURE WIRE/FFR STUDY;  Surgeon: Martinique, Peter M, MD;  Location: [place] CV LAB;  Service: Cardiovascular;  Laterality: RCA: DFR on pRCA 70% - 0.99, Not significant -> Rec Med Rx.  Marland Kitchen LEFT HEART CATH AND CORONARY ANGIOGRAPHY N/A 09/13/2018   Procedure: LEFT HEART CATH AND CORONARY ANGIOGRAPHY;  Surgeon: Leonie Man, MD;  Location: [place] CV LAB;  Service: Cardiovascular:: CULPRIT LESION 99% p-mCx followed by 55%mCx; pLAD 35% A D80fllowed by long 80% lesion @ SP1); pRCA 70%. Normal LVEDP. Global HK EF ~45%.  -Decision on PCI deferred until discussion about PCI versus CABG.  Concern because of long-term DOAC.  .Marland KitchenTRANSTHORACIC ECHOCARDIOGRAM  09/13/2018   Mildly reduced EF 45 to 50% with diffuse HK.  GR 1 DD.  Mild aortic valve calcification.  MAC.  Moderate pulmonic regurgitation.    Family History  Problem Relation Age of Onset  . Leukemia Mother   . Prostate cancer Father   . Cancer Father   . Stroke Maternal Grandmother   . Lung cancer Maternal Grandfather   . Leukemia Paternal Grandmother   . Cancer Paternal Grandfather   . Spina bifida Son        Multiple surgeries  . Colon cancer Neg Hx   . Esophageal cancer Neg Hx     Current Outpatient Medications:  .  amLODipine (NORVASC) 10 MG tablet, TAKE 1 TABLET BY MOUTH EVERYDAY AT BEDTIME, Disp: 90 tablet, Rfl: 3 .  carvedilol (COREG) 6.25 MG tablet, Take 3.125 mg by mouth 2 (two) times daily with a meal. , Disp: , Rfl:  .  clopidogrel (PLAVIX) 75 MG tablet, Take 37.5 mg by mouth daily., Disp: , Rfl:  .  ferrous sulfate 325 (65 FE) MG tablet, Take 1 tablet (325 mg total) by mouth 2 (two) times daily., Disp: 180 tablet, Rfl: 3 .  lisinopril (ZESTRIL) 40 MG tablet, Take 1 tablet (40 mg total) by mouth every morning., Disp: 90 tablet, Rfl: 3 .  nitroGLYCERIN (NITROSTAT) 0.4 MG SL tablet, Place  1 tablet (0.4 mg total) under the tongue every 5 (five) minutes as needed for up to 3 doses for chest pain., Disp: 12 tablet, Rfl: 0 .  pantoprazole (PROTONIX) 20 MG tablet, Take 20 mg by mouth daily., Disp: , Rfl:  .  rivaroxaban (XARELTO) 20 MG TABS tablet, Take 20 mg by mouth daily with supper., Disp: , Rfl:  .  spironolactone (ALDACTONE) 25 MG tablet, Take 0.5 tablets (12.5 mg total) by mouth every morning., Disp: 90 tablet, Rfl: 3  EXAM:  VITALS per patient if applicable:  GENERAL: alert, oriented, appears well and in no acute distress  HEENT: atraumatic, conjunctiva clear, no obvious abnormalities on inspection of external nose and ears  NECK: normal movements of the head and neck  LUNGS: on inspection no signs of respiratory distress, breathing rate appears normal, no obvious gross SOB, gasping or wheezing  CV: no obvious cyanosis  MS: moves all visible extremities without noticeable abnormality  PSYCH/NEURO: pleasant and cooperative, no obvious depression or anxiety, speech and thought processing grossly intact  ASSESSMENT AND PLAN:  Discussed the following assessment and plan:  Anxiety -GAD 7 score 9 this visit -Discussed various options including medication and counseling. -Patient declines counseling at this time -We will try hydroxyzine as needed for anxiety.  Given patient's sensitivities to medications will start a low dose, 25 mg.  Patient advised if needed can try half a pill to see how it affects her. -We will continue to monitor each visit  Accelerated hypertension -Improving -Patient encouraged to check BP at home, however it makes her nervous -Discussed lifestyle modifications and ways to reduce stress. -Continue current meds: Norvasc 10 mg daily, Coreg 3.125 mg twice daily, lisinopril 40 mg daily, Spironolactone 12.5 mg -Continue follow-up with cardiology  CAD -S/P PCI -Continue Plavix one half tab goal January 2021 -After January 2021 patient to  continue Xarelto  Tobacco use -pt encouraged to quit -We will continue to discuss each OFV.  Hypertriglyceridemia -Patient has not tolerated statins in the past -Continue to work with cardiology in regards to starting [place] -Discussed increasing physical activity and other lifestyle modifications.  Patient follow-up in 1 to 2 weeks for anxiety   I discussed the assessment and treatment plan with the patient. The patient was provided an opportunity to ask questions and all were answered. The patient agreed with the plan and demonstrated an understanding of the instructions.   The patient was advised to call back or seek an in-person evaluation if the symptoms worsen or if the condition fails to improve as anticipated.  I provided 24 minutes of non-face-to-face time during this encounter.   Billie Ruddy, MD

## 2019-06-06 ENCOUNTER — Telehealth: Payer: Self-pay | Admitting: Pharmacist

## 2019-06-06 MED ORDER — REPATHA SURECLICK 140 MG/ML ~~LOC~~ SOAJ: 140.0000 mg | SUBCUTANEOUS | 11 refills | Status: DC

## 2019-06-06 MED ORDER — AMLODIPINE BESYLATE 5 MG PO TABS: 5.0000 mg | ORAL_TABLET | Freq: Two times a day (BID) | ORAL | 0 refills | Status: DC

## 2019-06-06 MED ORDER — VASCEPA 1 G PO CAPS: 2.0000 | ORAL_CAPSULE | Freq: Two times a day (BID) | ORAL | 1 refills | Status: DC

## 2019-06-06 NOTE — Telephone Encounter (Signed)
Patient approved for Vascepa and Repatha on 01/05/2019. Patient refused repatha at the time but Vascepa was started as prescribed.    Noted recent fasting lipid panel worsen. Patient stopped taking Vacepa for unknown reason.  Will resume Vascepa today, agreed on starting Repatha 140mg  every 14 days.   She is to follow up with pharmacist clinic in 4 weeks. We will follow BP and Lipids as needed.

## 2019-06-06 NOTE — Addendum Note (Signed)
Addended by: Harrington Challenger on: 06/06/2019 09:45 AM   Modules accepted: Orders

## 2019-06-15 ENCOUNTER — Telehealth: Payer: BC Managed Care – PPO | Admitting: Family Medicine

## 2019-06-15 NOTE — Progress Notes (Signed)
Pt did not respond to text message for doxy visit.

## 2019-07-06 ENCOUNTER — Other Ambulatory Visit: Payer: Self-pay

## 2019-07-06 ENCOUNTER — Ambulatory Visit (INDEPENDENT_AMBULATORY_CARE_PROVIDER_SITE_OTHER): Payer: BC Managed Care – PPO | Admitting: Pharmacist

## 2019-07-06 VITALS — BP 138/72 | Ht 64.5 in | Wt 160.4 lb

## 2019-07-06 DIAGNOSIS — I10 Essential (primary) hypertension: Secondary | ICD-10-CM

## 2019-07-06 DIAGNOSIS — Z79899 Other long term (current) drug therapy: Secondary | ICD-10-CM

## 2019-07-06 DIAGNOSIS — E785 Hyperlipidemia, unspecified: Secondary | ICD-10-CM | POA: Diagnosis not present

## 2019-07-06 MED ORDER — AMLODIPINE BESYLATE 5 MG PO TABS: 5.0000 mg | ORAL_TABLET | Freq: Two times a day (BID) | ORAL | 1 refills | Status: DC

## 2019-07-06 MED ORDER — VASCEPA 1 G PO CAPS: 2.0000 g | ORAL_CAPSULE | Freq: Two times a day (BID) | ORAL | 1 refills | Status: DC

## 2019-07-06 MED ORDER — REPATHA SURECLICK 140 MG/ML ~~LOC~~ SOAJ: 140.0000 mg | SUBCUTANEOUS | 11 refills | Status: DC

## 2019-07-06 NOTE — Patient Instructions (Addendum)
Lipid Clinic (pharmacist) [phone] [name] [name]/[name] [name]  *Continue Repatha SureClick 140mg  every 14 days* *RESUME taking Vascepa 2g twice daily , start with 1gm twice daily to determine tolerability* *REPEAT fasting blood work in 2-3 months*  High Cholesterol  High cholesterol is a condition in which the blood has high levels of a white, waxy, fat-like substance (cholesterol). The human body needs small amounts of cholesterol. The liver makes all the cholesterol that the body needs. Extra (excess) cholesterol comes from the food that we eat. Cholesterol is carried from the liver by the blood through the blood vessels. If you have high cholesterol, deposits (plaques) may build up on the walls of your blood vessels (arteries). Plaques make the arteries narrower and stiffer. Cholesterol plaques increase your risk for heart attack and stroke. Work with your health care provider to keep your cholesterol levels in a healthy range. What increases the risk? This condition is more likely to develop in people who:  Eat foods that are high in animal fat (saturated fat) or cholesterol.  Are overweight.  Are not getting enough exercise.  Have a family history of high cholesterol. What are the signs or symptoms? There are no symptoms of this condition. How is this diagnosed? This condition may be diagnosed from the results of a blood test.  If you are older than age 6, your health care provider may check your cholesterol every 4-6 years.  You may be checked more often if you already have high cholesterol or other risk factors for heart disease. The blood test for cholesterol measures:  "Bad" cholesterol (LDL cholesterol). This is the main type of cholesterol that causes heart disease. The desired level for LDL is less than 100.  "Good" cholesterol (HDL cholesterol). This type helps to protect against heart disease by cleaning the arteries and carrying the LDL away. The desired level for HDL is 60  or higher.  Triglycerides. These are fats that the body can store or burn for energy. The desired number for triglycerides is lower than 150.  Total cholesterol. This is a measure of the total amount of cholesterol in your blood, including LDL cholesterol, HDL cholesterol, and triglycerides. A healthy number is less than 200. How is this treated? This condition is treated with diet changes, lifestyle changes, and medicines. Diet changes  This may include eating more whole grains, fruits, vegetables, nuts, and fish.  This may also include cutting back on red meat and foods that have a lot of added sugar. Lifestyle changes  Changes may include getting at least 40 minutes of aerobic exercise 3 times a week. Aerobic exercises include walking, biking, and swimming. Aerobic exercise along with a healthy diet can help you maintain a healthy weight.  Changes may also include quitting smoking. Medicines  Medicines are usually given if diet and lifestyle changes have failed to reduce your cholesterol to healthy levels.  Your health care provider may prescribe a statin medicine. Statin medicines have been shown to reduce cholesterol, which can reduce the risk of heart disease. Follow these instructions at home: Eating and drinking If told by your health care provider:  Eat chicken (without skin), fish, veal, shellfish, ground [place] breast, and round or loin cuts of red meat.  Do not eat fried foods or fatty meats, such as hot dogs and salami.  Eat plenty of fruits, such as apples.  Eat plenty of vegetables, such as broccoli, potatoes, and carrots.  Eat beans, peas, and lentils.  Eat grains such as barley, rice, couscous,  and bulgur wheat.  Eat pasta without cream sauces.  Use skim or nonfat milk, and eat low-fat or nonfat yogurt and cheeses.  Do not eat or drink whole milk, cream, ice cream, egg yolks, or hard cheeses.  Do not eat stick margarine or tub margarines that contain trans  fats (also called partially hydrogenated oils).  Do not eat saturated tropical oils, such as coconut oil and palm oil.  Do not eat cakes, cookies, crackers, or other baked goods that contain trans fats.  General instructions  Exercise as directed by your health care provider. Increase your activity level with activities such as gardening, walking, and taking the stairs.  Take over-the-counter and prescription medicines only as told by your health care provider.  Do not use any products that contain nicotine or tobacco, such as cigarettes and e-cigarettes. If you need help quitting, ask your health care provider.  Keep all follow-up visits as told by your health care provider. This is important. Contact a health care provider if:  You are struggling to maintain a healthy diet or weight.  You need help to start on an exercise program.  You need help to stop smoking. Get help right away if:  You have chest pain.  You have trouble breathing. This information is not intended to replace advice given to you by your health care provider. Make sure you discuss any questions you have with your health care provider. Document Released: 08/04/2005 Document Revised: 08/07/2017 Document Reviewed: 02/02/2016 Elsevier Patient Education  2020 [employer].

## 2019-07-06 NOTE — Progress Notes (Signed)
Patient ID: [name] [name]                 DOB: 06/26/1956                    MRN: [id number]     HPI:  [name] [name] is a 63 y.o. female patient referred to lipid clinic by [facility]. PMH is significant for NSTEMI, CAD/s/p PCI, lupus anticoagulant, and hypertension. We tried to start her on Repatha and Vascepa in April but patient was reluctant to initiate medication. Noted her Lipid panel looks worst now than before and she presents to clinic to medication adjustment and education.  Current Medications:  Repatha 140mg  every 14 days  Vascepa 2g twice  - not taking  Intolerances:  Atorvastatin 40mg  daily Rosuvastatin 40mg  daily Rosuvastatin 10mg  daily Pitavastatrin 4mg  daily Antara 90mg  daily Fenofibrate 145mg  daily  LDL goal: 70mg /dL or 50% reduction from baseline  Diet: very little to no meat, avoids eggs, and tries to limit breads and pasta in her diet  Family History: family history includes Cancer in her father and paternal grandfather; Leukemia in her mother and paternal grandmother; Lung cancer in her maternal grandfather; Prostate cancer in her father; Spina bifida in her son; Stroke in her maternal grandmother.  Social History: occasional alcohol, current smoker  Labs: Oct/14/2020: CHO 408; TG 769; HDL 177; LDL 201  Past Medical History:  Diagnosis Date  . Back pain   . CAD S/P percutaneous coronary angioplasty 09/13/2018   09/13/2018 - Cath & Staged DES PCI on 09/14/2018: DES PCI: Cx99%&55% - Resolute Onyx DES 3.5 x 30 (3.6 mm), p-mLAD (prox of D1 almost to D2) - Resolute Onyx DES 3.0 x 22 (3.3 mm); DFR on pRCA 70% - 0.99, Not significant -> Rec Med Rx.  . DVT (deep venous thrombosis) ([place])   . Heart attack ([place]) 09/09/2018  . Hypertension   . Lupus anticoagulant syndrome ([place])   . NSTEMI (non-ST elevated myocardial infarction) ([place]) 09/12/2018   Initial presentation with chest pain was on 09/09/2018, recurrent pain on 1/26 2020 with positive troponins.  Cath  showed three-vessel CAD -> declined CABG, opted for two-vessel DES PCI (LAD and CX, negative DFR RCA)  . Pulmonary embolism [facility])     Current Outpatient Medications on File Prior to Visit  Medication Sig Dispense Refill  . carvedilol (COREG) 6.25 MG tablet Take 3.125 mg by mouth 2 (two) times daily with a meal.     . clopidogrel (PLAVIX) 75 MG tablet Take 37.5 mg by mouth daily.    . ferrous sulfate 325 (65 FE) MG tablet Take 1 tablet (325 mg total) by mouth 2 (two) times daily. 180 tablet 3  . hydrOXYzine (ATARAX/VISTARIL) 25 MG tablet Take 1 tablet (25 mg total) by mouth 3 (three) times daily as needed. 30 tablet 1  . lisinopril (ZESTRIL) 40 MG tablet Take 1 tablet (40 mg total) by mouth every morning. 90 tablet 3  . nitroGLYCERIN (NITROSTAT) 0.4 MG SL tablet Place 1 tablet (0.4 mg total) under the tongue every 5 (five) minutes as needed for up to 3 doses for chest pain. 12 tablet 0  . pantoprazole (PROTONIX) 20 MG tablet Take 20 mg by mouth daily.    . rivaroxaban (XARELTO) 20 MG TABS tablet Take 20 mg by mouth daily with supper.    Marland Kitchen spironolactone (ALDACTONE) 25 MG tablet Take 0.5 tablets (12.5 mg total) by mouth every morning. 90 tablet 3   No current facility-administered  medications on file prior to visit.     Allergies  Allergen Reactions  . Prednisone Other (See Comments)    No energy "stalls out".    Makes pt  Jittery.  . Tramadol Other (See Comments)    Constipation,  Makes pt  Jittery.  . Crestor [Rosuvastatin Calcium]     Feels fatigued, RE  CHALLENGE - UNABLE TO USE 12/28/18  . Hctz [Hydrochlorothiazide]     Dizzy     Dyslipidemia, goal LDL below 70 LLD and TG remain extremely elevated for secondary prevention. Patient had been reluctant to initiate Vascepa therapy and to use Repatha as prescribed.  Both medications are covered by her insurance and co-pay cards were provided today during this appointment.  After long discussion including indication, common side  effects and monitoring of Repatha and Vascepa. Patient agreed on continuation of Repatha 140mg  every 14 days, start Vascepa 2g twice daily and repeat fasting blood work in 2 months. Plan to follow up in 2-3 months by phone to re-assess compliance and remind patient of pending blood work.    Safaa Stingley Rodriguez-Guzman PharmD, BCPS, [place] [id number] 07/12/2019 5:41 PM

## 2019-07-12 ENCOUNTER — Encounter: Payer: Self-pay | Admitting: Pharmacist

## 2019-07-12 NOTE — Assessment & Plan Note (Signed)
LLD and TG remain extremely elevated for secondary prevention. Patient had been reluctant to initiate Vascepa therapy and to use Repatha as prescribed.  Both medications are covered by her insurance and co-pay cards were provided today during this appointment.  After long discussion including indication, common side effects and monitoring of Repatha and Vascepa. Patient agreed on continuation of Repatha 140mg  every 14 days, start Vascepa 2g twice daily and repeat fasting blood work in 2 months. Plan to follow up in 2-3 months by phone to re-assess compliance and remind patient of pending blood work.

## 2019-07-26 ENCOUNTER — Telehealth: Payer: Self-pay | Admitting: *Deleted

## 2019-07-26 NOTE — Telephone Encounter (Signed)
Spoke with patient. Patient has spoken with the pharmaceutical company and was told she makes to much money for the program and her part will be $395. Patient is very adaement about going back to Coumadin d/t price

## 2019-07-26 NOTE — Telephone Encounter (Signed)
Copied from [place] [id number]. Topic: Quick Communication - Rx Refill/Question >> Jul 26, 2019  1:46 PM Erick Blinks wrote: Reason for CRM: Pt can no longer afford her rivaroxaban (XARELTO) 20 MG TABS tablet, she is requesting Warfarin or something else that is more affordable. Please advise   [id number] [place] [place] [address] [place] [address] Phone: [phone] Fax: [phone]

## 2019-07-26 NOTE — Telephone Encounter (Signed)
Pt may be able to sign up for assistance program.  She should reach out to her Cardiologist to see if they have options for obtaining Xarelto.

## 2019-07-27 ENCOUNTER — Telehealth: Payer: Self-pay

## 2019-07-27 NOTE — Telephone Encounter (Signed)
Please advise 

## 2019-07-27 NOTE — Telephone Encounter (Signed)
See my note below

## 2019-07-27 NOTE — Telephone Encounter (Signed)
Can set up pt with coumadin clinic as she would need to have INR checked regularly if wanting to switch from xarelto.

## 2019-07-27 NOTE — Telephone Encounter (Signed)
Pt can not use assistance program due to her household income and the cost will still be high/ Pt would like to see what other blood thinner options Dr. Volanda Napoleon can change her to with a better/lower cost/ Pt waited today for a call back about this matter/ Pt only has 1 pill for tomorrow and will need a new medication asap/ please advise

## 2019-07-27 NOTE — Telephone Encounter (Signed)
Spoke with pt verbalized understanding that her samples for Xarelto are in the office, pt will be here tomorrow to pick up samples and pt will also make appointment with [facility] for Coumadin check.

## 2019-07-28 NOTE — Telephone Encounter (Signed)
Per Dr.Banks pt has been told xarelto samples are ready for pt to pick up

## 2019-08-02 ENCOUNTER — Other Ambulatory Visit: Payer: Self-pay

## 2019-08-03 ENCOUNTER — Ambulatory Visit: Payer: BC Managed Care – PPO

## 2019-08-03 ENCOUNTER — Telehealth: Payer: Self-pay | Admitting: Pharmacist Clinician (PhC)/ Clinical Pharmacy Specialist

## 2019-08-03 ENCOUNTER — Telehealth: Payer: Self-pay | Admitting: General Practice

## 2019-08-03 MED ORDER — RIVAROXABAN 20 MG PO TABS: 20.0000 mg | ORAL_TABLET | Freq: Every day | ORAL | 0 refills | Status: DC

## 2019-08-03 NOTE — Telephone Encounter (Signed)
Left message with patient's husband.  Dr. Volanda Napoleon is referring patient to cardiology for warfarin management.  Patient has been instructed to call Dr. Allison Quarry office to schedule appointment with anticoagulation clinic.

## 2019-08-03 NOTE — Telephone Encounter (Signed)
Received message from [place].  Pt can no longer afford Xarelto and PCP would prefer pt to be followed by cardiology for switching to warfarin.  Pt recently switched to [facility] and is not eligible to use $10 month copay card.  Cannot afford the $300+ per month cost.    Spoke with patient, she states has enough Xarelto for about 2 more weeks.  Because this puts [place] right in the middle of the New Years holiday, I will give her 3 weeks of samples to get well into January when we can then determine her best course of action.  Of note, warfarin could be challenging [place].  May need to consider getting home meter.    Patient agreeable to 3 weeks samples.  Will look in to [employer] and see if they cover with copay card for patients on [facility] - may be our best choice if covered.

## 2019-08-10 ENCOUNTER — Ambulatory Visit: Payer: BC Managed Care – PPO

## 2019-08-24 ENCOUNTER — Telehealth: Payer: Self-pay

## 2019-08-24 DIAGNOSIS — Z7902 Long term (current) use of antithrombotics/antiplatelets: Secondary | ICD-10-CM

## 2019-08-24 NOTE — Telephone Encounter (Signed)
Pt stated that they would like to start coumadin so I scheduled new coumadin appt at the same time as dr harding per raquel. Pt voiced understanding

## 2019-08-24 NOTE — Telephone Encounter (Signed)
-----   Message from Rockne Menghini, [place] sent at 08/03/2019  2:28 PM EST ----- Call patient to consider options before runs out of xarelto samples

## 2019-08-25 ENCOUNTER — Telehealth: Payer: Self-pay | Admitting: Cardiology

## 2019-08-25 NOTE — Telephone Encounter (Signed)
We do recommend you get the Covid vaccine. However, if you have additional questions please call your Primary Care doctor to discuss if the Vaccine is right for you.  Pt notified ok to get COVID vaccine and to call PCP for any further questions

## 2019-08-25 NOTE — Telephone Encounter (Signed)
New message:    Patient calling to ask if it is ok to get the covid shot. Please call patient back.

## 2019-08-31 ENCOUNTER — Other Ambulatory Visit: Payer: Self-pay | Admitting: Family Medicine

## 2019-08-31 DIAGNOSIS — I10 Essential (primary) hypertension: Secondary | ICD-10-CM

## 2019-09-01 ENCOUNTER — Encounter (INDEPENDENT_AMBULATORY_CARE_PROVIDER_SITE_OTHER): Payer: Self-pay

## 2019-09-01 ENCOUNTER — Other Ambulatory Visit: Payer: Self-pay

## 2019-09-01 ENCOUNTER — Encounter: Payer: Self-pay | Admitting: Pharmacist

## 2019-09-01 ENCOUNTER — Ambulatory Visit (INDEPENDENT_AMBULATORY_CARE_PROVIDER_SITE_OTHER): Payer: Self-pay | Admitting: Cardiology

## 2019-09-01 ENCOUNTER — Telehealth: Payer: Self-pay | Admitting: Pharmacist

## 2019-09-01 ENCOUNTER — Encounter: Payer: Self-pay | Admitting: Cardiology

## 2019-09-01 VITALS — BP 182/94 | HR 103 | Ht 65.5 in | Wt 160.0 lb

## 2019-09-01 DIAGNOSIS — I10 Essential (primary) hypertension: Secondary | ICD-10-CM

## 2019-09-01 DIAGNOSIS — Z720 Tobacco use: Secondary | ICD-10-CM

## 2019-09-01 DIAGNOSIS — D6859 Other primary thrombophilia: Secondary | ICD-10-CM

## 2019-09-01 DIAGNOSIS — Z86718 Personal history of other venous thrombosis and embolism: Secondary | ICD-10-CM

## 2019-09-01 DIAGNOSIS — Z9861 Coronary angioplasty status: Secondary | ICD-10-CM

## 2019-09-01 DIAGNOSIS — I214 Non-ST elevation (NSTEMI) myocardial infarction: Secondary | ICD-10-CM

## 2019-09-01 DIAGNOSIS — R000 Tachycardia, unspecified: Secondary | ICD-10-CM

## 2019-09-01 DIAGNOSIS — E785 Hyperlipidemia, unspecified: Secondary | ICD-10-CM

## 2019-09-01 DIAGNOSIS — E781 Pure hyperglyceridemia: Secondary | ICD-10-CM

## 2019-09-01 DIAGNOSIS — I2510 Atherosclerotic heart disease of native coronary artery without angina pectoris: Secondary | ICD-10-CM

## 2019-09-01 MED ORDER — SPIRONOLACTONE 25 MG PO TABS: 12.5000 mg | ORAL_TABLET | ORAL | 3 refills | Status: DC

## 2019-09-01 MED ORDER — RIVAROXABAN 20 MG PO TABS: 20.0000 mg | ORAL_TABLET | Freq: Every day | ORAL | 11 refills | Status: DC

## 2019-09-01 NOTE — Patient Instructions (Addendum)
Medication Instructions:   take an extra Carvedilol today when you get home  Start taking Spironolactone 12.5 mg  Daily with Lisinopril. *If you need a refill on your cardiac medications before your next appointment, please call your pharmacy*  Lab Work: Not needed  Testing/Procedures: Not needed  Follow-Up: At [facility], you and your health needs are our priority.  As part of our continuing mission to provide you with exceptional heart care, we have created designated Provider Care Teams.  These Care Teams include your primary Cardiologist (physician) and Advanced Practice Providers (APPs -  Physician Assistants and Nurse Practitioners) who all work together to provide you with the care you need, when you need it.  Your next appointment:   6 month(s)  The format for your next appointment:   In Person  Provider:   Glenetta Hew, MD  Other Instructions -> Okay to hold stop Plavix at the end of January

## 2019-09-01 NOTE — Telephone Encounter (Signed)
New insurance   ID# [id number] BIN# [id number] PCN# [id number] Grp# [id number]

## 2019-09-01 NOTE — Progress Notes (Signed)
This encounter was created in error - please disregard.

## 2019-09-01 NOTE — Progress Notes (Signed)
Primary Care Provider: Billie Ruddy, MD Cardiologist: Glenetta Hew, MD Electrophysiologist:   Clinic Note: No chief complaint on file.   HPI:    [name] [name] is a 64 y.o. female with a PMH below who presents today for 9-monthfollow-up for CAD, hypertension and hyperlipidemia..[name] Harmanhas a history of recurrent DVT-PE related to LUPUS ANTICOAGULANT, and suffered a non-ST elevation MI in January 2020.  Was found to have three-vessel disease, but opted for PCI.  She underwent PCI to the LAD and circumflex with medical management of the RCA planned.  She has been very reluctant to take antiplatelet agents and is cut her Plavix down to 1/2 tablet daily for fear of bleeding issues while on Plavix plus DOAC (Xarelto).  She is also been relatively difficult to control from a lipid standpoint.  Did not tolerate Crestor.  Also stopped taking Vascepa.  Has been reluctant to do PCSK9 inhibitors.  She is also had issues with blood pressure control.  She personally stopped taking HCTZ because of side effect issues.  Was recently supposed be started on spironolactone.  Is currently taking amlodipine 5 mg twice daily and taking 1/2 of a 6.25 mg carvedilol tablet (3.125 mg) twice daily.  [name] [name] last seen on October 9 indicating that when she reduced her carvedilol dose and clopidogrel dose, her friends noted that she had a "return to this park I and was becoming more like herself again. ->  Was dealing with her husband diagnosis and treatment for prostate cancer. -->  She had had 1 ER visit for chest pain.  Recent Hospitalizations: None  She was seen by Racquel Rodriguez-Guzman, RPH in [facility] for lipid management --> started on Repatha.  (She has subsequently stopped taking it --citing concerns about insurance coverage)  Reviewed  CV studies:    The following studies were reviewed today: (if available, images/films reviewed: From Epic Chart or Care  Everywhere) . None:   Interval History:   [name] Courtsreturns today overall feeling well from a cardiac standpoint.  Her blood pressure is quite high today, but she tells me it has been much better than this at home.  She somewhat misunderstood the issue with Repatha and insurance coverage.  Is willing to retry.  She has had some off and on sharp stabbing chest discomfort that is nonexertional.  Feels more like a muscular strain as it is in the upper part of her left parasternal region radiating to the shoulder.  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  She will not see clients unless they are wearing a mask.  For the most part, she feels relatively stable overall from a cardiac standpoint with no real anginal symptoms or heart failure symptoms.   CV Review of Symptoms (Summary): no chest pain or dyspnea on exertion positive for - Anxiety.  Sharp chest pains that are not like her angina.  Elevated blood pressure with some headaches. negative for - edema, irregular heartbeat, orthopnea, palpitations, paroxysmal nocturnal dyspnea, rapid heart rate, shortness of breath or Syncope/near syncope, TIA Summers fugax, claudication.  The patient does not have symptoms concerning for COVID-19 infection (fever, chills, cough, or new shortness of breath).  The patient is practicing social distancing & Masking.   She is very careful when she goes out for groceries/shopping, and while at work.  REVIEWED OF SYSTEMS   A comprehensive ROS was performed. Review of Systems  Constitutional: Negative for chills, fever and malaise/fatigue (No longer feeling tired and fatigued.).  HENT: Negative for congestion and nosebleeds.   Respiratory: Negative for cough and shortness of breath.   Cardiovascular: Positive for chest pain (Has some sharp stabbing pains along the left upper chest  radiating laterally.).       Also notes blood pressure increasing  Gastrointestinal: Negative for abdominal pain, blood in stool, heartburn, melena and vomiting.  Genitourinary: Negative for hematuria.  Musculoskeletal: Positive for joint pain. Negative for falls.  Neurological: Negative for dizziness, focal weakness and weakness.  Endo/Heme/Allergies: Bruises/bleeds easily.  Psychiatric/Behavioral: Negative for memory loss. The patient is nervous/anxious and has insomnia.        She still has a quite a stressful job even though she is only working part-time.  All other systems reviewed and are negative.  I have reviewed and (if needed) personally updated the patient's problem list, medications, allergies, past medical and surgical history, social and family history.   PAST MEDICAL HISTORY   Past Medical History:  Diagnosis Date  . Back pain   . CAD S/P percutaneous coronary angioplasty 09/13/2018   09/13/2018 - Cath & Staged DES PCI on 09/14/2018: DES PCI: Cx99%&55% - Resolute Onyx DES 3.5 x 30 (3.6 mm), p-mLAD (prox of D1 almost to D2) - Resolute Onyx DES 3.0 x 22 (3.3 mm); DFR on pRCA 70% - 0.99, Not significant -> Rec Med Rx.  . DVT (deep venous thrombosis) ([place])   . Heart attack ([place]) 09/09/2018  . Hypertension   . Lupus anticoagulant syndrome ([place])   . NSTEMI (non-ST elevated myocardial infarction) ([place]) 09/12/2018   Initial presentation with chest pain was on 09/09/2018, recurrent pain on 1/26 2020 with positive troponins.  Cath showed three-vessel CAD -> declined CABG, opted for two-vessel DES PCI (LAD and CX, negative DFR RCA)  . Pulmonary embolism ([place])     PAST SURGICAL HISTORY   Past Surgical History:  Procedure Laterality Date  . BACK SURGERY    . COLONOSCOPY  05/19/2012   Procedure: COLONOSCOPY;  Surgeon: Beryle Beams, MD;  Location: [place];  Service: Endoscopy;  Laterality: N/A;  . COLONOSCOPY WITH PROPOFOL N/A 09/30/2018   Procedure: COLONOSCOPY WITH PROPOFOL;   Surgeon: Yetta Flock, MD;  Location: [place];  Service: Gastroenterology;  Laterality: N/A;  . CORONARY STENT INTERVENTION N/A 09/14/2018   Procedure: CORONARY STENT INTERVENTION;  Surgeon: Martinique, Peter M, MD;  Location: [place] CV [facility];  Service: Cardiovascular: September 14, 2018- DES PCI: Cx99%&55% - Resolute Onyx DES 3.5 x 30 (3.6 mm), p-mLAD (prox of D1 almost to D2) - Resolute Onyx DES 3.0 x 22 (3.3 mm); DFR on pRCA 70% - 0.99, Not significant -> Rec Med Rx.  . ESOPHAGOGASTRODUODENOSCOPY  05/19/2012   Procedure: ESOPHAGOGASTRODUODENOSCOPY (EGD);  Surgeon: Beryle Beams, MD;  Location: [facility] ENDOSCOPY;  Service: Endoscopy;  Laterality: N/A;  . ESOPHAGOGASTRODUODENOSCOPY (EGD) WITH PROPOFOL N/A 09/30/2018   Procedure: ESOPHAGOGASTRODUODENOSCOPY (EGD) WITH PROPOFOL;  Surgeon: Yetta Flock, MD;  Location: [place];  Service: Gastroenterology;  Laterality: N/A;  . GIVENS CAPSULE STUDY N/A 09/30/2018   Procedure: GIVENS CAPSULE STUDY;  Surgeon: Yetta Flock, MD;  Location: [place];  Service: Gastroenterology;  Laterality: N/A;  . HOT HEMOSTASIS N/A 09/30/2018   Procedure: HOT HEMOSTASIS (ARGON PLASMA COAGULATION/BICAP);  Surgeon: Yetta Flock, MD;  Location[facility] ENDOSCOPY;  Service: Gastroenterology;  Laterality: N/A;  .  INTRAVASCULAR PRESSURE WIRE/FFR STUDY N/A 09/14/2018   Procedure: INTRAVASCULAR PRESSURE WIRE/FFR STUDY;  Surgeon: Martinique, Peter M, MD;  Location: [place] [facility];  Service: Cardiovascular;  Laterality: RCA: DFR on pRCA 70% - 0.99, Not significant -> Rec Med Rx.  Marland Kitchen LEFT HEART CATH AND CORONARY ANGIOGRAPHY N/A 09/13/2018   Procedure: LEFT HEART CATH AND CORONARY ANGIOGRAPHY;  Surgeon: Leonie Man, MD;  Location: [place] [facility];  Service: Cardiovascular:: CULPRIT LESION 99% p-mCx followed by 55%mCx; pLAD 35% A D63fllowed by long 80% lesion @ SP1); pRCA 70%. Normal LVEDP. Global HK EF ~45%.  -Decision on PCI deferred until discussion  about PCI versus CABG.  Concern because of long-term DOAC.  .Marland KitchenTRANSTHORACIC ECHOCARDIOGRAM  09/13/2018   Mildly reduced EF 45 to 50% with diffuse HK.  GR 1 DD.  Mild aortic valve calcification.  MAC.  Moderate pulmonic regurgitation.   Diagnostic1/27Intervention1/28: PCI Cx & LAD, RCA DFR negative.     MEDICATIONS/ALLERGIES   Current Meds  Medication Sig  . amLODipine (NORVASC) 5 MG tablet Take 1 tablet (5 mg total) by mouth 2 (two) times daily.  . carvedilol (COREG) 6.25 MG tablet Take 3.125 mg by mouth 2 (two) times daily with a meal.   . clopidogrel (PLAVIX) 75 MG tablet Take 37.5 mg by mouth daily.  .Marland Kitchenicosapent Ethyl (VASCEPA) 1 g capsule Take 2 capsules (2 g total) by mouth 2 (two) times daily.  .Marland Kitchenlisinopril (ZESTRIL) 40 MG tablet Take 1 tablet (40 mg total) by mouth every morning.  . nitroGLYCERIN (NITROSTAT) 0.4 MG SL tablet Place 1 tablet (0.4 mg total) under the tongue every 5 (five) minutes as needed for up to 3 doses for chest pain.  . pantoprazole (PROTONIX) 20 MG tablet Take 20 mg by mouth daily.  .Marland Kitchenspironolactone (ALDACTONE) 25 MG tablet Take 0.5 tablets (12.5 mg total) by mouth every morning.  . [DISCONTINUED] rivaroxaban (XARELTO) 20 MG TABS tablet Take 1 tablet (20 mg total) by mouth daily with supper.  . [DISCONTINUED] spironolactone (ALDACTONE) 25 MG tablet Take 0.5 tablets (12.5 mg total) by mouth every morning.    Allergies  Allergen Reactions  . Prednisone Other (See Comments)    No energy "stalls out".    Makes pt  Jittery.  . Tramadol Other (See Comments)    Constipation,  Makes pt  Jittery.  . Crestor [Rosuvastatin Calcium]     Feels fatigued, RE  CHALLENGE - UNABLE TO USE 12/28/18  . Hctz [Hydrochlorothiazide]     Dizzy   . Repatha [Evolocumab]      SOCIAL HISTORY/FAMILY HISTORY   Social History   Tobacco Use  . Smoking status: Current Every Day Smoker    Packs/day: 0.50    Years: 29.00    Pack years: 14.50    Types: Cigarettes  .  Smokeless tobacco: Never Used  Substance Use Topics  . Alcohol use: Yes    Comment: Occasional  . Drug use: No   Social History   Social History Narrative   Really is a married mother of 2.  1 son died from complications of spina bifida after multiple surgeries.   She currently works as a sTherapist, nutritionalat [employer]     She has a has a mOceanographerin education.     Drinks social alcohol & denies drug use.     Pt endorses smoking maybe 2 cigarettes/day.  States she placed most of the cigarette, may have 2 puffs.  Family History family history includes Cancer in her father and paternal grandfather; Leukemia in her mother and paternal grandmother; Lung cancer in her maternal grandfather; Prostate cancer in her father; Spina bifida in her son; Stroke in her maternal grandmother.   OBJCTIVE -PE, EKG, labs   Wt Readings from Last 3 Encounters:  09/01/19 160 lb (72.6 kg)  07/06/19 160 lb 6.4 oz (72.8 kg)  05/27/19 156 lb 3.2 oz (70.9 kg)    Physical Exam: BP (!) 182/94   Pulse (!) 103   Ht 5' 5.5" (1.664 m)   Wt 160 lb (72.6 kg)   SpO2 99%   BMI 26.22 kg/m   --Noted home blood pressure today was 140/70.  Apparently her pressures have been much better at home than they are here. Physical Exam  Constitutional: She is oriented to person, place, and time. She appears well-developed and well-nourished. No distress.  Healthy-appearing..  Well-groomed.  HENT:  Head: Normocephalic and atraumatic.  Neck: No hepatojugular reflux and no JVD present. Carotid bruit is not present. No thyromegaly present.  Cardiovascular: Normal rate, regular rhythm, normal heart sounds and intact distal pulses.  No extrasystoles are present. PMI is not displaced. Exam reveals no gallop and no friction rub.  No murmur heard. No significant murmur heard  Pulmonary/Chest: Effort normal and breath sounds normal. No respiratory distress. She has no wheezes. She has no rales.  Abdominal:  Soft. Bowel sounds are normal. She exhibits no distension. There is no abdominal tenderness. There is no rebound.  Musculoskeletal:        General: No edema. Normal range of motion.     Cervical back: Neck supple.  Neurological: She is alert and oriented to person, place, and time.  Psychiatric: She has a normal mood and affect. Her behavior is normal. Judgment and thought content normal.  Vitals reviewed.   Adult ECG Report Not checked.  Recent Labs:    Lab Results  Component Value Date   CHOL 408 (H) 06/01/2019   HDL 30 (L) 06/01/2019   LDLCALC 201 (H) 06/01/2019   LDLDIRECT 141.0 11/25/2018   TRIG 769 (HH) 06/01/2019   CHOLHDL 13.6 (H) 06/01/2019    Lab Results  Component Value Date   CREATININE 0.69 06/01/2019   BUN 7 (L) 06/01/2019   NA 138 06/01/2019   K 4.5 06/01/2019   CL 100 06/01/2019   CO2 23 06/01/2019    ASSESSMENT/PLAN    Problem List Items Addressed This Visit    Non-ST elevation (NSTEMI) myocardial infarction (HCC) (Chronic)    Just about 1 year out from her MI.  Finding it difficult to titrate cardiovascular medications because of intolerances and insurance issues.  Thankfully, no further anginal heart failure symptoms.      Relevant Medications   spironolactone (ALDACTONE) 25 MG tablet   Accelerated hypertension (Chronic)    Her blood pressure is high today.  But she tells me has been better at home.  I need her to check her blood pressures at home regularly and report back.  Plan for her is to take an extra dose of carvedilol today.  I also want her to start spironolactone 12.5 mg daily.  Anticipate that we will probably need to titrate carvedilol up to full dose twice daily, and spironolactone to 25 mg daily..      Relevant Medications   spironolactone (ALDACTONE) 25 MG tablet   Dyslipidemia, goal LDL below 70 (Chronic)   Relevant Medications   spironolactone (ALDACTONE) 25 MG tablet  Tobacco use (Chronic)    Not interested in quitting  yet      Essential hypertension (Chronic)    Continue amlodipine which she does better is 5 mg twice twice daily.  Will likely need to titrate carvedilol further to 6.25 mg twice daily and have restarted spironolactone which apparently she never started.        Relevant Medications   spironolactone (ALDACTONE) 25 MG tablet   Hypercoagulation syndrome (HCC) (Chronic)    On lifelong Xarelto.  Okay to stop Plavix at the end of the month.  Would like to take aspirin 80 mg at least every other day for CAD benefit.      History of DVT of lower extremity (Chronic)   Hypertriglyceridemia (Chronic)    She is somewhat difficult to manage because of medication intolerances.  Did not take Vascepa, but we are hoping to get her started on PCSK9 inhibitor.      Relevant Medications   spironolactone (ALDACTONE) 25 MG tablet   CAD -S/P PCI - Primary (Chronic)    She is now just about a year out from her two-vessel PCI.  She understands the risks involved with stopping Plavix, but I think is agreeable to those risks and would like to just lay on Xarelto.    Plan is to stop Plavix at the end of this month.  Can restart 81 mg aspirin.      Relevant Medications   spironolactone (ALDACTONE) 25 MG tablet   Sinus tachycardia    Should be on tolerate higher dose of carvedilol. I have asked her to take extra dose today, will hopefully titrate up to full 625 mg twice daily next visit.          COVID-19 Education: The signs and symptoms of COVID-19 were discussed with the patient and how to seek care for testing (follow up with PCP or arrange E-visit).   The importance of social distancing was discussed today.  I spent a total of 28 minutes with the patient and chart review. >  50% of the time was spent in direct patient consultation.  Additional time spent with chart review (studies, outside notes, etc): 8 Total Time: 36 min   Current medicines are reviewed at length with the patient today.   (+/- concerns) n/a   Patient Instructions / Medication Changes & Studies & Tests Ordered   Patient Instructions  Medication Instructions:   take an extra Carvedilol today when you get home  Start taking Spironolactone 12.5 mg  Daily with Lisinopril. *If you need a refill on your cardiac medications before your next appointment, please call your pharmacy*  Lab Work: Not needed  Testing/Procedures: Not needed  Follow-Up: At [facility], you and your health needs are our priority.  As part of our continuing mission to provide you with exceptional heart care, we have created designated Provider Care Teams.  These Care Teams include your primary Cardiologist (physician) and Advanced Practice Providers (APPs -  Physician Assistants and Nurse Practitioners) who all work together to provide you with the care you need, when you need it.  Your next appointment:   6 month(s)  The format for your next appointment:   In Person  Provider:   Glenetta Hew, MD  Other Instructions -> Okay to hold stop Plavix at the end of January    Studies Ordered:   No orders of the defined types were placed in this encounter.    Glenetta Hew, M.D., M.S. Interventional Cardiologist  Pager # [phone] Phone # [phone] [address] [place], [place] [address]   Thank you for choosing Heartcare at [facility]!!

## 2019-09-04 NOTE — Assessment & Plan Note (Signed)
Just about 1 year out from her MI.  Finding it difficult to titrate cardiovascular medications because of intolerances and insurance issues.  Thankfully, no further anginal heart failure symptoms.

## 2019-09-04 NOTE — Assessment & Plan Note (Signed)
Should be on tolerate higher dose of carvedilol. I have asked her to take extra dose today, will hopefully titrate up to full 625 mg twice daily next visit.

## 2019-09-04 NOTE — Assessment & Plan Note (Signed)
On lifelong Xarelto.  Okay to stop Plavix at the end of the month.  Would like to take aspirin 80 mg at least every other day for CAD benefit.

## 2019-09-04 NOTE — Assessment & Plan Note (Signed)
Continue amlodipine which she does better is 5 mg twice twice daily.  Will likely need to titrate carvedilol further to 6.25 mg twice daily and have restarted spironolactone which apparently she never started.

## 2019-09-04 NOTE — Assessment & Plan Note (Signed)
Her blood pressure is high today.  But she tells me has been better at home.  I need her to check her blood pressures at home regularly and report back.  Plan for her is to take an extra dose of carvedilol today.  I also want her to start spironolactone 12.5 mg daily.  Anticipate that we will probably need to titrate carvedilol up to full dose twice daily, and spironolactone to 25 mg daily.Marland Kitchen

## 2019-09-04 NOTE — Assessment & Plan Note (Addendum)
She is now just about a year out from her two-vessel PCI.  She understands the risks involved with stopping Plavix, but I think is agreeable to those risks and would like to just lay on Xarelto.    Plan is to stop Plavix at the end of this month.  Can restart 81 mg aspirin.

## 2019-09-04 NOTE — Assessment & Plan Note (Signed)
She is somewhat difficult to manage because of medication intolerances.  Did not take Vascepa, but we are hoping to get her started on PCSK9 inhibitor.

## 2019-09-04 NOTE — Assessment & Plan Note (Signed)
Not interested in quitting yet

## 2019-09-07 ENCOUNTER — Other Ambulatory Visit: Payer: Self-pay | Admitting: Family Medicine

## 2019-09-28 ENCOUNTER — Ambulatory Visit: Payer: 59 | Admitting: Family Medicine

## 2019-09-28 ENCOUNTER — Encounter: Payer: Self-pay | Admitting: Family Medicine

## 2019-09-28 ENCOUNTER — Other Ambulatory Visit: Payer: Self-pay

## 2019-09-28 VITALS — BP 128/78 | HR 90 | Temp 97.3°F | Ht 65.5 in | Wt 161.0 lb

## 2019-09-28 DIAGNOSIS — R309 Painful micturition, unspecified: Secondary | ICD-10-CM

## 2019-09-28 LAB — POCT URINALYSIS DIPSTICK
Bilirubin, UA: NEGATIVE
Blood, UA: POSITIVE
Glucose, UA: NEGATIVE
Ketones, UA: NEGATIVE
Nitrite, UA: NEGATIVE
Protein, UA: NEGATIVE
Spec Grav, UA: 1.01 (ref 1.010–1.025)
Urobilinogen, UA: 0.2 E.U./dL
pH, UA: 6 (ref 5.0–8.0)

## 2019-09-28 MED ORDER — CEPHALEXIN 500 MG PO CAPS: 500.0000 mg | ORAL_CAPSULE | Freq: Four times a day (QID) | ORAL | 0 refills | Status: DC

## 2019-09-28 NOTE — Patient Instructions (Signed)

## 2019-09-28 NOTE — Progress Notes (Signed)
Subjective:     Patient ID: [name] [name], female   DOB: [birth year], 64 y.o.   MRN: [id number]  HPI   [name] [name] is seen as a work-in with onset last weekend of some very mild burning at the end of urination.  No frequency.  No gross hematuria.  No fevers or chills.  No flank pain.  She noticed her urine looks somewhat "darker "than usual  She states she has not had a urinary tract infection in several years.  No known antibiotic allergies  Past Medical History:  Diagnosis Date  . Back pain   . CAD S/P percutaneous coronary angioplasty 09/13/2018   09/13/2018 - Cath & Staged DES PCI on 09/14/2018: DES PCI: Cx99%&55% - Resolute Onyx DES 3.5 x 30 (3.6 mm), p-mLAD (prox of D1 almost to D2) - Resolute Onyx DES 3.0 x 22 (3.3 mm); DFR on pRCA 70% - 0.99, Not significant -> Rec Med Rx.  . DVT (deep venous thrombosis) ([place])   . Heart attack ([place]) 09/09/2018  . Hypertension   . Lupus anticoagulant syndrome ([place])   . NSTEMI (non-ST elevated myocardial infarction) ([place]) 09/12/2018   Initial presentation with chest pain was on 09/09/2018, recurrent pain on 1/26 2020 with positive troponins.  Cath showed three-vessel CAD -> declined CABG, opted for two-vessel DES PCI (LAD and CX, negative DFR RCA)  . Pulmonary embolism [facility])    Past Surgical History:  Procedure Laterality Date  . BACK SURGERY    . COLONOSCOPY  05/19/2012   Procedure: COLONOSCOPY;  Surgeon: Beryle Beams, MD;  Location: [place];  Service: Endoscopy;  Laterality: N/A;  . COLONOSCOPY WITH PROPOFOL N/A 09/30/2018   Procedure: COLONOSCOPY WITH PROPOFOL;  Surgeon: Yetta Flock, MD;  Location: [place];  Service: Gastroenterology;  Laterality: N/A;  . CORONARY STENT INTERVENTION N/A 09/14/2018   Procedure: CORONARY STENT INTERVENTION;  Surgeon: Martinique, Peter M, MD;  Location: [place] CV LAB;  Service: Cardiovascular: September 14, 2018- DES PCI: Cx99%&55% - Resolute Onyx DES 3.5 x 30 (3.6 mm), p-mLAD (prox of D1 almost to  D2) - Resolute Onyx DES 3.0 x 22 (3.3 mm); DFR on pRCA 70% - 0.99, Not significant -> Rec Med Rx.  . ESOPHAGOGASTRODUODENOSCOPY  05/19/2012   Procedure: ESOPHAGOGASTRODUODENOSCOPY (EGD);  Surgeon: Beryle Beams, MD;  Location: [facility] ENDOSCOPY;  Service: Endoscopy;  Laterality: N/A;  . ESOPHAGOGASTRODUODENOSCOPY (EGD) WITH PROPOFOL N/A 09/30/2018   Procedure: ESOPHAGOGASTRODUODENOSCOPY (EGD) WITH PROPOFOL;  Surgeon: Yetta Flock, MD;  Location: [place];  Service: Gastroenterology;  Laterality: N/A;  . GIVENS CAPSULE STUDY N/A 09/30/2018   Procedure: GIVENS CAPSULE STUDY;  Surgeon: Yetta Flock, MD;  Location: [place];  Service: Gastroenterology;  Laterality: N/A;  . HOT HEMOSTASIS N/A 09/30/2018   Procedure: HOT HEMOSTASIS (ARGON PLASMA COAGULATION/BICAP);  Surgeon: Yetta Flock, MD;  Location: [facility] ENDOSCOPY;  Service: Gastroenterology;  Laterality: N/A;  . INTRAVASCULAR PRESSURE WIRE/FFR STUDY N/A 09/14/2018   Procedure: INTRAVASCULAR PRESSURE WIRE/FFR STUDY;  Surgeon: Martinique, Peter M, MD;  Location: [place] CV LAB;  Service: Cardiovascular;  Laterality: RCA: DFR on pRCA 70% - 0.99, Not significant -> Rec Med Rx.  Marland Kitchen LEFT HEART CATH AND CORONARY ANGIOGRAPHY N/A 09/13/2018   Procedure: LEFT HEART CATH AND CORONARY ANGIOGRAPHY;  Surgeon: Leonie Man, MD;  Location: [place] CV LAB;  Service: Cardiovascular:: CULPRIT LESION 99% p-mCx followed by 55%mCx; pLAD 35% A D44fllowed by long 80% lesion @ SP1); pRCA 70%. Normal LVEDP. Global HK EF ~45%.  -  Decision on PCI deferred until discussion about PCI versus CABG.  Concern because of long-term DOAC.  Marland Kitchen TRANSTHORACIC ECHOCARDIOGRAM  09/13/2018   Mildly reduced EF 45 to 50% with diffuse HK.  GR 1 DD.  Mild aortic valve calcification.  MAC.  Moderate pulmonic regurgitation.    reports that she has been smoking cigarettes. She has a 14.50 pack-year smoking history. She has never used smokeless tobacco. She reports current  alcohol use. She reports that she does not use drugs. family history includes Cancer in her father and paternal grandfather; Leukemia in her mother and paternal grandmother; Lung cancer in her maternal grandfather; Prostate cancer in her father; Spina bifida in her son; Stroke in her maternal grandmother. Allergies  Allergen Reactions  . Prednisone Other (See Comments)    No energy "stalls out".    Makes pt  Jittery.  . Tramadol Other (See Comments)    Constipation,  Makes pt  Jittery.  . Crestor [Rosuvastatin Calcium]     Feels fatigued, RE  CHALLENGE - UNABLE TO USE 12/28/18  . Hctz [Hydrochlorothiazide]     Dizzy   . Repatha [Evolocumab]      Review of Systems  Constitutional: Negative for appetite change, chills and fever.  Gastrointestinal: Negative for abdominal pain, constipation, diarrhea, nausea and vomiting.  Genitourinary: Positive for dysuria and frequency.  Musculoskeletal: Negative for back pain.  Neurological: Negative for dizziness.       Objective:   Physical Exam Constitutional:      Appearance: She is well-developed.  HENT:     Head: Normocephalic and atraumatic.  Neck:     Thyroid: No thyromegaly.  Cardiovascular:     Rate and Rhythm: Normal rate and regular rhythm.     Heart sounds: Normal heart sounds.  Pulmonary:     Breath sounds: Normal breath sounds.  Abdominal:     General: Bowel sounds are normal.     Palpations: Abdomen is soft.     Tenderness: There is no abdominal tenderness.  Musculoskeletal:     Cervical back: Neck supple.        Assessment:     Dysuria.  Rule out uncomplicated cystitis    Plan:     -Urine culture sent -Start Keflex 500 mg 4 times daily for 5 days pending culture results -Stay well-hydrated -Follow-up for any persistent or worsening symptoms.  Eulas Post MD [facility]

## 2019-09-30 LAB — URINE CULTURE
MICRO NUMBER:: 10138422
SPECIMEN QUALITY:: ADEQUATE

## 2019-12-01 ENCOUNTER — Telehealth: Payer: Self-pay | Admitting: Cardiology

## 2019-12-01 NOTE — Telephone Encounter (Signed)
Insurance had formulary change. Prefer product Lovaza. Will try PA for Vascepa before changing product.

## 2019-12-01 NOTE — Telephone Encounter (Signed)
PA SUBMITTED FOR VASCEPA 1GM Bryce Perry (Key: [id number]) FOR [employer]

## 2019-12-01 NOTE — Telephone Encounter (Signed)
I spoke with [facility] and insurance has denied Vascepa.   Per pharmacy insurance information indicates alternate drug therapy is required prior to use of submitted medication. Number to call for questions is [phone]

## 2019-12-01 NOTE — Telephone Encounter (Signed)
  Pharmacy is calling because patients icosapent Ethyl (VASCEPA) 1 g capsule is being denied by her insurance company. They would like to know what Dr Ellyn Hack would like to do.

## 2019-12-05 ENCOUNTER — Other Ambulatory Visit: Payer: Self-pay | Admitting: Pharmacist

## 2019-12-05 MED ORDER — ICOSAPENT ETHYL 1 G PO CAPS: 2.0000 g | ORAL_CAPSULE | Freq: Two times a day (BID) | ORAL | 1 refills | Status: DC

## 2019-12-05 NOTE — Telephone Encounter (Signed)
Rx sent for Vascepa generic. Will try Lovaza if Vascepa generic not covered by [employer].

## 2019-12-05 NOTE — Telephone Encounter (Signed)
vascepa denied preferred lovaza will route to pharmd raquel for further instruction

## 2019-12-05 NOTE — Telephone Encounter (Signed)
Rx for Vascepa generic sent to pharmacy. Will change to Lovaza only if Vascepa gen. not covered.

## 2020-01-04 ENCOUNTER — Telehealth: Payer: Self-pay

## 2020-01-04 NOTE — Telephone Encounter (Signed)
Called the pt to let the know that they were approved for the praluent and the pt stated that they had issues praluent aswell such as achy and miserable worse than the covid shot. I will route to pharmd pool

## 2020-01-05 ENCOUNTER — Telehealth: Payer: Self-pay

## 2020-01-05 NOTE — Telephone Encounter (Signed)
Called pt to schedule lipid clinic appt but I had to leave a msg on her machine

## 2020-01-05 NOTE — Telephone Encounter (Signed)
Please call and schedule appt for lipid clinic.

## 2020-01-05 NOTE — Telephone Encounter (Signed)
lmomed to schedule appt for lipid clinic

## 2020-01-11 ENCOUNTER — Telehealth: Payer: Self-pay

## 2020-01-11 NOTE — Telephone Encounter (Signed)
lmomed to call and schedule appt

## 2020-01-12 ENCOUNTER — Telehealth: Payer: Self-pay

## 2020-01-12 NOTE — Telephone Encounter (Signed)
3rd and final lmom to the pt to schedule lipid clinic appt to discuss other options since the repatha and praluent are intolerable to pt.

## 2020-01-31 ENCOUNTER — Other Ambulatory Visit: Payer: Self-pay | Admitting: Cardiology

## 2020-02-02 ENCOUNTER — Other Ambulatory Visit: Payer: Self-pay | Admitting: Cardiology

## 2020-02-02 ENCOUNTER — Other Ambulatory Visit: Payer: Self-pay

## 2020-02-02 ENCOUNTER — Telehealth: Payer: Self-pay | Admitting: Family Medicine

## 2020-02-02 MED ORDER — LISINOPRIL 40 MG PO TABS: 40.0000 mg | ORAL_TABLET | Freq: Every day | ORAL | 1 refills | Status: DC

## 2020-02-02 MED ORDER — CARVEDILOL 6.25 MG PO TABS: 3.1250 mg | ORAL_TABLET | Freq: Two times a day (BID) | ORAL | 3 refills | Status: DC

## 2020-02-02 NOTE — Telephone Encounter (Signed)
Rx sent to pt pharmacy 

## 2020-02-02 NOTE — Telephone Encounter (Signed)
*  STAT* If patient is at the pharmacy, call can be transferred to refill team.   1. Which medications need to be refilled? (please list name of each medication and dose if known)  carvedilol (COREG) 6.25 MG tablet  2. Which pharmacy/location (including street and city if local pharmacy) is medication to be sent to? [id number] [name] [place] [address]  3. Do they need a 30 day or 90 day supply? 90 day supply    Patient only has 2 days worth of medication left.

## 2020-02-02 NOTE — Telephone Encounter (Signed)
Pt would like refill for lisinopril. Send to [facility] [phone]

## 2020-02-10 ENCOUNTER — Other Ambulatory Visit: Payer: Self-pay | Admitting: Cardiology

## 2020-02-10 MED ORDER — PANTOPRAZOLE SODIUM 20 MG PO TBEC: 20.0000 mg | DELAYED_RELEASE_TABLET | Freq: Every day | ORAL | 1 refills | Status: DC

## 2020-02-10 NOTE — Addendum Note (Signed)
Addended by: Jacinta Shoe on: 02/10/2020 01:59 PM   Modules accepted: Orders

## 2020-02-10 NOTE — Telephone Encounter (Signed)
*  STAT* If patient is at the pharmacy, call can be transferred to refill team.   1. Which medications need to be refilled? (please list name of each medication and dose if known) pantoprazole (PROTONIX) 40 MG tablet  2. Which pharmacy/location (including street and city if local pharmacy) is medication to be sent to? [id number] [name], [place] [address]  3. Do they need a 30 day or 90 day supply? 90 day

## 2020-03-01 ENCOUNTER — Encounter: Payer: Self-pay | Admitting: Family Medicine

## 2020-03-01 ENCOUNTER — Other Ambulatory Visit: Payer: Self-pay

## 2020-03-01 ENCOUNTER — Ambulatory Visit: Payer: 59 | Admitting: Family Medicine

## 2020-03-01 VITALS — BP 122/62 | HR 86 | Temp 98.3°F | Ht 65.5 in | Wt 159.0 lb

## 2020-03-01 DIAGNOSIS — M25561 Pain in right knee: Secondary | ICD-10-CM

## 2020-03-01 NOTE — Progress Notes (Signed)
Subjective:    Patient ID: [name] [name], female    DOB: 12/14/1955, 64 y.o.   MRN: [id number]  Chief Complaint  Patient presents with  . Knee Pain    R knee pain    HPI Pt is a 64 yo female with pmh sig for HTN, CAD s/p PCI, h/o NSTEMI, Lupus anticoagulant d/o on chronic anticoag, h/o DVT, h/o PE, h/o GIB, tobacco use, HLD,  was seen today for f/u.  Pt seen in [facility] ED on 7/13 for acute R knee pain.  Pain started as a fullness in popliteal fossa x a few days.  Pt then heard a pop in R knee while walking in her kitchen.  She felt intense pain that kept her up at night causing her to go to the ED.  In the ED, Xray of knee and u/s RLE negative for DVT.   Pt notes continued knee pain and edema.  Using a cane to ambulate.  Pt has not taken anything for the symptoms.  She can only take tylenol 2/2 bleeding hx.  Using ice.  Pt does not like pain meds.  Past Medical History:  Diagnosis Date  . Back pain   . CAD S/P percutaneous coronary angioplasty 09/13/2018   09/13/2018 - Cath & Staged DES PCI on 09/14/2018: DES PCI: Cx99%&55% - Resolute Onyx DES 3.5 x 30 (3.6 mm), p-mLAD (prox of D1 almost to D2) - Resolute Onyx DES 3.0 x 22 (3.3 mm); DFR on pRCA 70% - 0.99, Not significant -> Rec Med Rx.  . DVT (deep venous thrombosis) ([place])   . Heart attack ([place]) 09/09/2018  . Hypertension   . Lupus anticoagulant syndrome ([place])   . NSTEMI (non-ST elevated myocardial infarction) ([place]) 09/12/2018   Initial presentation with chest pain was on 09/09/2018, recurrent pain on 1/26 2020 with positive troponins.  Cath showed three-vessel CAD -> declined CABG, opted for two-vessel DES PCI (LAD and CX, negative DFR RCA)  . Pulmonary embolism ([facility])     Allergies  Allergen Reactions  . Prednisone Other (See Comments)    No energy "stalls out".    Makes pt  Jittery.  . Tramadol Other (See Comments)    Constipation,  Makes pt  Jittery.  . Crestor [Rosuvastatin Calcium]     Feels fatigued, RE   CHALLENGE - UNABLE TO USE 12/28/18  . Hctz [Hydrochlorothiazide]     Dizzy   . Repatha [Evolocumab]     ROS General: Denies fever, chills, night sweats, changes in weight, changes in appetite HEENT: Denies headaches, ear pain, changes in vision, rhinorrhea, sore throat CV: Denies CP, palpitations, SOB, orthopnea Pulm: Denies SOB, cough, wheezing GI: Denies abdominal pain, nausea, vomiting, diarrhea, constipation GU: Denies dysuria, hematuria, frequency, vaginal discharge Msk: Denies muscle cramps, joint pains  +R knee pain and edema Neuro: Denies weakness, numbness, tingling Skin: Denies rashes, bruising Psych: Denies depression, anxiety, hallucinations      Objective:    Blood pressure 122/62, pulse 86, temperature 98.3 F (36.8 C), temperature source Other (Comment), height 5' 5.5" (1.664 m), weight 159 lb (72.1 kg), SpO2 99 %.   Gen. Pleasant, well-nourished, in no distress, normal affect   HEENT: [place]/AT, face symmetric, conjunctiva clear, no scleral icterus, PERRLA, EOMI, nares patent without drainage Lungs: no accessory muscle use Cardiovascular: RRR, no peripheral edema Musculoskeletal: mild edema of R knee superior to patella at base of quad.  No crepitus.  TTP of R knee at medial joint line> lateral joint line. No  fullness in the popliteal fossa.  negative anterior drawer, lachman's.  No deformities, no cyanosis or clubbing, normal tone Neuro:  A&Ox3, CN II-XII intact, normal gait Skin:  Warm, no lesions/ rash   Wt Readings from Last 3 Encounters:  03/01/20 159 lb (72.1 kg)  09/28/19 161 lb (73 kg)  09/01/19 160 lb (72.6 kg)    Lab Results  Component Value Date   WBC 11.3 (H) 05/19/2019   HGB 14.0 05/19/2019   HCT 42.9 05/19/2019   PLT 479 (H) 05/19/2019   GLUCOSE 108 (H) 06/01/2019   CHOL 408 (H) 06/01/2019   TRIG 769 (HH) 06/01/2019   HDL 30 (L) 06/01/2019   LDLDIRECT 141.0 11/25/2018   LDLCALC 201 (H) 06/01/2019   ALT 11 06/01/2019   AST 17 06/01/2019    NA 138 06/01/2019   K 4.5 06/01/2019   CL 100 06/01/2019   CREATININE 0.69 06/01/2019   BUN 7 (L) 06/01/2019   CO2 23 06/01/2019   TSH 1.180 01/22/2016   INR 1.2 05/19/2019    Assessment/Plan:  Acute pain of right knee -discussed possible causes including sprain, cartilage derangement, ligament tear, etc -discussed supportive care tylenol, ice, rest, compression, elevation -Knee wrapped in clinic with Ace bandage -discussed MRI.  Pt wishes to wait -for continued or worsening symptoms will place referral to Ortho. -given precautions  Follow-up as needed for continued or worsening symptoms.  Grier Mitts, MD

## 2020-03-15 ENCOUNTER — Other Ambulatory Visit: Payer: Self-pay | Admitting: Cardiology

## 2020-03-16 ENCOUNTER — Other Ambulatory Visit: Payer: Self-pay

## 2020-03-16 ENCOUNTER — Ambulatory Visit: Payer: 59 | Admitting: Family Medicine

## 2020-03-16 ENCOUNTER — Encounter: Payer: Self-pay | Admitting: Family Medicine

## 2020-03-16 VITALS — BP 128/78 | HR 70 | Temp 98.3°F | Wt 158.0 lb

## 2020-03-16 DIAGNOSIS — M25461 Effusion, right knee: Secondary | ICD-10-CM

## 2020-03-16 DIAGNOSIS — M25561 Pain in right knee: Secondary | ICD-10-CM

## 2020-03-16 NOTE — Progress Notes (Signed)
Subjective:    Patient ID: [name] Aber, female    DOB: 17-Feb-1956, 64 y.o.   MRN: [id number]  No chief complaint on file.   HPI Pt is a 64 yo female with pmh sig for HTN, h/o NSTEMI, CAD s/p PCI, lupus anticoagulant d/o on chronic anticoagulation, h/o DVT, h/o GIB, tobacco use, HLD who was seen for follow-up on R knee pain.  Seen in ED 7/13 for acute knee pain/fullness in popliteal fossa after hearing a pop in her knee while walking to kitchen.  Xray and RLE doppler were negative.  Pt seen in clinic 7/15 for f/u.  Discussed supportive care at that time.  Pt notes continued/ worsened pain in the right lateral knee with edema.  Popliteal fossa edema improving.  After sitting pt has to stand for a while before she can move otherwise has pain and instability in right knee.  Ambulating with a cane.  Wearing Ace bandage, using ice, tried Aspercreme, and taking Tylenol 650 mg 2 tabs in AM.  Pt notes increased pain after working a few days at this past week.    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  Starting to have intermittent edema in lower R leg.   Past Medical History:  Diagnosis Date  . Back pain   . CAD S/P percutaneous coronary angioplasty 09/13/2018   09/13/2018 - Cath & Staged DES PCI on 09/14/2018: DES PCI: Cx99%&55% - Resolute Onyx DES 3.5 x 30 (3.6 mm), p-mLAD (prox of D1 almost to D2) - Resolute Onyx DES 3.0 x 22 (3.3 mm); DFR on pRCA 70% - 0.99, Not significant -> Rec Med Rx.  . DVT (deep venous thrombosis) ([place])   . Heart attack ([place]) 09/09/2018  . Hypertension   . Lupus anticoagulant syndrome ([place])   . NSTEMI (non-ST elevated myocardial infarction) ([place]) 09/12/2018   Initial presentation with chest pain was on 09/09/2018, recurrent pain on 1/26 2020 with positive troponins.  Cath showed three-vessel CAD -> declined CABG, opted for two-vessel DES PCI (LAD and CX, negative DFR RCA)  . Pulmonary embolism ([facility])      Allergies  Allergen Reactions  . Prednisone Other (See Comments)    No energy "stalls out".    Makes pt  Jittery.  . Tramadol Other (See Comments)    Constipation,  Makes pt  Jittery.  . Crestor [Rosuvastatin Calcium]     Feels fatigued, RE  CHALLENGE - UNABLE TO USE 12/28/18  . Hctz [Hydrochlorothiazide]     Dizzy   . Repatha [Evolocumab]     ROS General: Denies fever, chills, night sweats, changes in weight, changes in appetite HEENT: Denies headaches, ear pain, changes in vision, rhinorrhea, sore throat CV: Denies CP, palpitations, SOB, orthopnea Pulm: Denies SOB, cough, wheezing GI: Denies abdominal pain, nausea, vomiting, diarrhea, constipation GU: Denies dysuria, hematuria, frequency, vaginal discharge Msk: Denies muscle cramps  + right medial knee pain with radiation to thigh, right lower extremity edema Neuro: Denies weakness, numbness, tingling Skin: Denies rashes, bruising Psych: Denies depression, anxiety, hallucinations    Objective:    Blood pressure 128/78, pulse 70, temperature 98.3 F (36.8 C), temperature source Oral, weight 158 lb (71.7 kg), SpO2 98 %.   Gen. Pleasant, well-nourished, in no distress, normal affect   HEENT: [place]/AT, face symmetric, conjunctiva clear, no scleral icterus, PERRLA, EOMI, nares patent without drainage Lungs: no accessory muscle use Cardiovascular: RRR, no peripheral edema  Musculoskeletal: R medial knee at joint line with edema and TTP.  No deformities, no cyanosis or clubbing, normal tone Neuro:  A&Ox3, CN II-XII intact, slight limp, ambulating with a cane Skin:  Warm, no lesions/ rash   Wt Readings from Last 3 Encounters:  03/01/20 159 lb (72.1 kg)  09/28/19 161 lb (73 kg)  09/01/19 160 lb (72.6 kg)    Lab Results  Component Value Date   WBC 11.3 (H) 05/19/2019   HGB 14.0 05/19/2019   HCT 42.9 05/19/2019   PLT 479 (H) 05/19/2019   GLUCOSE 108 (H) 06/01/2019   CHOL 408 (H) 06/01/2019   TRIG 769 (HH) 06/01/2019    HDL 30 (L) 06/01/2019   LDLDIRECT 141.0 11/25/2018   LDLCALC 201 (H) 06/01/2019   ALT 11 06/01/2019   AST 17 06/01/2019   NA 138 06/01/2019   K 4.5 06/01/2019   CL 100 06/01/2019   CREATININE 0.69 06/01/2019   BUN 7 (L) 06/01/2019   CO2 23 06/01/2019   TSH 1.180 01/22/2016   INR 1.2 05/19/2019    Assessment/Plan:  Acute pain of right knee  -Increased -Continue supportive care -Given history of popping sound, continued symptoms, and instability we will proceed with imaging given concern for cartilage derangement versus ligament tear -We will also place referral to Ortho -Given precautions - Plan: MR Knee Right Wo Contrast, Ambulatory referral to Orthopedic Surgery  Effusion of right knee -Continue supportive care including ice, rest, compression, elevation, Tylenol and/or topical analgesics. -Avoid NSAIDs 2/2 history of GIB - Plan: MR Knee Right Wo Contrast, Ambulatory referral to Orthopedic Surgery  F/u prn  Grier Mitts, MD

## 2020-03-20 ENCOUNTER — Telehealth: Payer: Self-pay | Admitting: Family Medicine

## 2020-03-20 NOTE — Telephone Encounter (Signed)
Rip Harbour is calling with a Authorization [id number] for a PA.  She is looking for documentation for the following:  pain and conservate duration and xray if any this can be faxed to the number of  [phone].

## 2020-03-23 ENCOUNTER — Other Ambulatory Visit: Payer: Self-pay

## 2020-03-23 ENCOUNTER — Ambulatory Visit
Admission: RE | Admit: 2020-03-23 | Discharge: 2020-03-23 | Disposition: A | Discharge status: Home / Self Care | Payer: 59 | Source: Ambulatory Visit | Attending: Family Medicine | Admitting: Family Medicine

## 2020-03-23 DIAGNOSIS — M25561 Pain in right knee: Secondary | ICD-10-CM

## 2020-03-23 DIAGNOSIS — M25461 Effusion, right knee: Secondary | ICD-10-CM

## 2020-03-23 NOTE — Telephone Encounter (Signed)
Pt office notes faxed to [facility]

## 2020-03-29 ENCOUNTER — Encounter: Payer: Self-pay | Admitting: Orthopaedic Surgery

## 2020-03-29 ENCOUNTER — Ambulatory Visit: Payer: 59 | Admitting: Cardiology

## 2020-03-29 ENCOUNTER — Ambulatory Visit (INDEPENDENT_AMBULATORY_CARE_PROVIDER_SITE_OTHER): Payer: 59 | Admitting: Orthopaedic Surgery

## 2020-03-29 VITALS — Ht 64.5 in | Wt 161.0 lb

## 2020-03-29 DIAGNOSIS — M25561 Pain in right knee: Secondary | ICD-10-CM | POA: Diagnosis not present

## 2020-03-29 DIAGNOSIS — G8929 Other chronic pain: Secondary | ICD-10-CM

## 2020-03-29 NOTE — Progress Notes (Signed)
Office Visit Note   Patient: [name] [name]           Date of Birth: [birth year]           MRN: [id number] Visit Date: 03/29/2020              Requested by: Billie Ruddy, MD [place],  [place] [address] PCP: Billie Ruddy, MD   Assessment & Plan: Visit Diagnoses:  1. Chronic pain of right knee     Plan: MRI of the right knee shows radial tear of the meniscal root with slight extrusion.  There is bony edema of the medial tibial plateau as well.  These findings were reviewed with the patient in detail.  She is medically complex and has strong history of DVT and PE.  We had a long discussion on her options that include meniscal root repair which would certainly put her at risk for recurrent DVT and PE versus conservative treatment with medial unloader brace and Voltaren gel and cane.  She understands that without repair of the meniscal root tear that degenerative changes can rapidly develop that would in the future necessitate a total knee replacement should her pain be substantial.  She understands that a meniscal root repair is not a guarantee that she could avoid a knee replacement in the future.  She does already have focal grade 4 changes of the patellofemoral compartment.  I would like to recheck her in 6 weeks to see how she is doing. Total face to face encounter time was greater than 45 minutes and over half of this time was spent in counseling and/or coordination of care.  Follow-Up Instructions: Return in about 6 weeks (around 05/10/2020).   Orders:  No orders of the defined types were placed in this encounter.  No orders of the defined types were placed in this encounter.     Procedures: No procedures performed   Clinical Data: No additional findings.   Subjective: Chief Complaint  Patient presents with  . Right Knee - Pain    [name] [name] is a 64 year old female referral from PCP for chronic right knee pain for 4weeks on the medial side.  She  was walking felt a pop on 02/27/2020.  Since then she has been taking Tylenol for the pain.  She takes chronic Xarelto for history of DVT and PE and lupus anticoagulant disorder.  She denies any mechanical symptoms.  Denies any swelling of the right knee.  Ambulating with a cane.   Review of Systems  Constitutional: Negative.   HENT: Negative.   Eyes: Negative.   Respiratory: Negative.   Cardiovascular: Negative.   Endocrine: Negative.   Musculoskeletal: Negative.   Neurological: Negative.   Hematological: Negative.   Psychiatric/Behavioral: Negative.   All other systems reviewed and are negative.    Objective: Vital Signs: Ht 5' 4.5" (1.638 m)   Wt 161 lb (73 kg)   BMI 27.21 kg/m   Physical Exam Vitals and nursing note reviewed.  Constitutional:      Appearance: She is well-developed.  HENT:     Head: Normocephalic and atraumatic.  Pulmonary:     Effort: Pulmonary effort is normal.  Abdominal:     Palpations: Abdomen is soft.  Musculoskeletal:     Cervical back: Neck supple.  Skin:    General: Skin is warm.     Capillary Refill: Capillary refill takes less than 2 seconds.  Neurological:     Mental Status: She is alert  and oriented to person, place, and time.  Psychiatric:        Behavior: Behavior normal.        Thought Content: Thought content normal.        Judgment: Judgment normal.     Ortho Exam Right knee shows trace joint effusion.  Medial joint line tenderness and of the medial tibial plateau fracture. Specialty Comments:  No specialty comments available.  Imaging: No results found.   PMFS History: Patient Active Problem List   Diagnosis Date Noted  . Accelerated hypertension 05/29/2019  . LA (lupus anticoagulant) disorder ([place]) 05/29/2019  . Anxiety about health 05/27/2019  . Heme positive stool   . Antiplatelet or antithrombotic long-term use   . Acute on chronic anemia 09/28/2018  . CAD -S/P PCI 09/24/2018  . Non-ST elevation (NSTEMI)  myocardial infarction ([place])   . History of pulmonary embolus (PE) 09/12/2018  . GERD (gastroesophageal reflux disease) 12/25/2016  . Microcytic anemia 01/24/2016  . Hypertriglyceridemia 01/24/2016  . Dyslipidemia, goal LDL below 70 06/21/2015  . History of DVT of lower extremity   . Edema of left lower extremity   . Hypercoagulation syndrome ([place]) 05/19/2012  . Iron deficiency 05/19/2012  . Thrombocytosis ([employer]) 05/19/2012  . Acute lower GI bleeding 05/18/2012  . Sinus tachycardia 04/21/2012  . Hypokalemia 04/20/2012  . Tobacco use 04/20/2012  . Essential hypertension 04/20/2012   Past Medical History:  Diagnosis Date  . Back pain   . CAD S/P percutaneous coronary angioplasty 09/13/2018   09/13/2018 - Cath & Staged DES PCI on 09/14/2018: DES PCI: Cx99%&55% - Resolute Onyx DES 3.5 x 30 (3.6 mm), p-mLAD (prox of D1 almost to D2) - Resolute Onyx DES 3.0 x 22 (3.3 mm); DFR on pRCA 70% - 0.99, Not significant -> Rec Med Rx.  . DVT (deep venous thrombosis) ([place])   . Heart attack ([place]) 09/09/2018  . Hypertension   . Lupus anticoagulant syndrome ([place])   . NSTEMI (non-ST elevated myocardial infarction) ([place]) 09/12/2018   Initial presentation with chest pain was on 09/09/2018, recurrent pain on 1/26 2020 with positive troponins.  Cath showed three-vessel CAD -> declined CABG, opted for two-vessel DES PCI (LAD and CX, negative DFR RCA)  . Pulmonary embolism ([facility])     Family History  Problem Relation Age of Onset  . Leukemia Mother   . Prostate cancer Father   . Cancer Father   . Stroke Maternal Grandmother   . Lung cancer Maternal Grandfather   . Leukemia Paternal Grandmother   . Cancer Paternal Grandfather   . Spina bifida Son        Multiple surgeries  . Colon cancer Neg Hx   . Esophageal cancer Neg Hx     Past Surgical History:  Procedure Laterality Date  . BACK SURGERY    . COLONOSCOPY  05/19/2012   Procedure: COLONOSCOPY;  Surgeon: Beryle Beams, MD;  Location: [facility];   Service: Endoscopy;  Laterality: N/A;  . COLONOSCOPY WITH PROPOFOL N/A 09/30/2018   Procedure: COLONOSCOPY WITH PROPOFOL;  Surgeon: Yetta Flock, MD;  Location: [facility];  Service: Gastroenterology;  Laterality: N/A;  . CORONARY STENT INTERVENTION N/A 09/14/2018   Procedure: CORONARY STENT INTERVENTION;  Surgeon: Martinique, Peter M, MD;  Location: [facility];  Service: Cardiovascular: September 14, 2018- DES PCI: Cx99%&55% - Resolute Onyx DES 3.5 x 30 (3.6 mm), p-mLAD (prox of D1 almost to D2) - Resolute Onyx DES 3.0 x 22 (3.3 mm); DFR on pRCA 70% -  0.99, Not significant -> Rec Med Rx.  . ESOPHAGOGASTRODUODENOSCOPY  05/19/2012   Procedure: ESOPHAGOGASTRODUODENOSCOPY (EGD);  Surgeon: Beryle Beams, MD;  Location[facility];  Service: Endoscopy;  Laterality: N/A;  . ESOPHAGOGASTRODUODENOSCOPY (EGD) WITH PROPOFOL N/A 09/30/2018   Procedure: ESOPHAGOGASTRODUODENOSCOPY (EGD) WITH PROPOFOL;  Surgeon: Yetta Flock, MD;  Location: [facility];  Service: Gastroenterology;  Laterality: N/A;  . GIVENS CAPSULE STUDY N/A 09/30/2018   Procedure: GIVENS CAPSULE STUDY;  Surgeon: Yetta Flock, MD;  Location: [place];  Service: Gastroenterology;  Laterality: N/A;  . HOT HEMOSTASIS N/A 09/30/2018   Procedure: HOT HEMOSTASIS (ARGON PLASMA COAGULATION/BICAP);  Surgeon: Yetta Flock, MD;  Location: [facility];  Service: Gastroenterology;  Laterality: N/A;  . INTRAVASCULAR PRESSURE WIRE/FFR STUDY N/A 09/14/2018   Procedure: INTRAVASCULAR PRESSURE WIRE/FFR STUDY;  Surgeon: Martinique, Peter M, MD;  Location: [place] [facility];  Service: Cardiovascular;  Laterality: RCA: DFR on pRCA 70% - 0.99, Not significant -> Rec Med Rx.  Marland Kitchen LEFT HEART CATH AND CORONARY ANGIOGRAPHY N/A 09/13/2018   Procedure: LEFT HEART CATH AND CORONARY ANGIOGRAPHY;  Surgeon: Leonie Man, MD;  Location: [place] [facility];  Service: Cardiovascular:: CULPRIT LESION 99% p-mCx followed by 55%mCx; pLAD 35% A  D22fllowed by long 80% lesion @ SP1); pRCA 70%. Normal LVEDP. Global HK EF ~45%.  -Decision on PCI deferred until discussion about PCI versus CABG.  Concern because of long-term DOAC.  .Marland KitchenTRANSTHORACIC ECHOCARDIOGRAM  09/13/2018   Mildly reduced EF 45 to 50% with diffuse HK.  GR 1 DD.  Mild aortic valve calcification.  MAC.  Moderate pulmonic regurgitation.   Social History   Occupational History  . Occupation: SERVICE MANAGER    Employer: [employer]  Tobacco Use  . Smoking status: Current Every Day Smoker    Packs/day: 0.50    Years: 29.00    Pack years: 14.50    Types: Cigarettes  . Smokeless tobacco: Never Used  Vaping Use  . Vaping Use: Never used  Substance and Sexual Activity  . Alcohol use: Yes    Comment: Occasional  . Drug use: No  . Sexual activity: Not on file

## 2020-04-04 ENCOUNTER — Telehealth: Payer: Self-pay | Admitting: Orthopaedic Surgery

## 2020-04-04 NOTE — Telephone Encounter (Signed)
I will email Thurmond Butts so that they can R/S that appt.

## 2020-04-04 NOTE — Telephone Encounter (Signed)
Pt called stating she will ned to reschedule her brace fitting that was supposed to happen on 04/05/20 but doesn't have the mans number or any of his information and would like to know if we could CB with it so she can reschedule?  [phone]

## 2020-04-13 ENCOUNTER — Other Ambulatory Visit: Payer: Self-pay

## 2020-04-13 ENCOUNTER — Encounter: Payer: Self-pay | Admitting: Cardiology

## 2020-04-13 ENCOUNTER — Ambulatory Visit (INDEPENDENT_AMBULATORY_CARE_PROVIDER_SITE_OTHER): Payer: 59 | Admitting: Cardiology

## 2020-04-13 VITALS — BP 130/82 | HR 90 | Ht 64.0 in | Wt 160.0 lb

## 2020-04-13 DIAGNOSIS — E781 Pure hyperglyceridemia: Secondary | ICD-10-CM

## 2020-04-13 DIAGNOSIS — I2510 Atherosclerotic heart disease of native coronary artery without angina pectoris: Secondary | ICD-10-CM

## 2020-04-13 DIAGNOSIS — Z9861 Coronary angioplasty status: Secondary | ICD-10-CM | POA: Diagnosis not present

## 2020-04-13 DIAGNOSIS — I10 Essential (primary) hypertension: Secondary | ICD-10-CM

## 2020-04-13 DIAGNOSIS — Z7902 Long term (current) use of antithrombotics/antiplatelets: Secondary | ICD-10-CM

## 2020-04-13 DIAGNOSIS — E785 Hyperlipidemia, unspecified: Secondary | ICD-10-CM

## 2020-04-13 DIAGNOSIS — D6862 Lupus anticoagulant syndrome: Secondary | ICD-10-CM

## 2020-04-13 DIAGNOSIS — F17210 Nicotine dependence, cigarettes, uncomplicated: Secondary | ICD-10-CM | POA: Diagnosis not present

## 2020-04-13 DIAGNOSIS — Z716 Tobacco abuse counseling: Secondary | ICD-10-CM

## 2020-04-13 DIAGNOSIS — D6859 Other primary thrombophilia: Secondary | ICD-10-CM

## 2020-04-13 DIAGNOSIS — Z720 Tobacco use: Secondary | ICD-10-CM

## 2020-04-13 MED ORDER — FISH OIL 1000 MG PO CPDR: 3000.0000 | DELAYED_RELEASE_CAPSULE | Freq: Every day | ORAL | 11 refills | Status: DC

## 2020-04-13 MED ORDER — COQ10 100 MG PO CAPS: 300.0000 | ORAL_CAPSULE | Freq: Every day | ORAL | 11 refills | Status: DC

## 2020-04-13 NOTE — Progress Notes (Signed)
Primary Care Provider: Billie Ruddy, MD Cardiologist: Glenetta Hew, MD Electrophysiologist:   Clinic Note: Chief Complaint  Patient presents with  . Follow-up    Delayed 6 months  . Coronary Artery Disease    Angina   HPI:    [name] [name] is a 64 y.o. female with a PMH notable for CAD (non-STEMI in January 3354) complicated by history of DVT having PE related to lupus anticoagulant who presents today for 58-month-month follow-up for CAD, hypertension and hyperlipidemia..[name] [name] a history of recurrent DVT-PE related to LUPUS ANTICOAGULANT, and suffered a non-ST elevation MI in January 2020.  Was found to have three-vessel disease, but opted for PCI.  She underwent PCI to the LAD and circumflex with medical management of the RCA planned.  Very reluctant to take antiplatelet agents in conjunction with DOAC (Xarelto) she was cutting her Plavix dose in half   Difficult lipid control: Unable to tolerate simvastatin, atorvastatin and rosuvastatin.  She also stopped taking Vascepa for the same reason which makes no sense.  In addition, she has been reluctant to try PCSK9 inhibitors.    Was seen by our CV[facility] Pharmacists --> not willing to try medications - even injections  Also because of possible "medication side effects, she has been not taking some of her blood pressure medications.  Refused to take HCTZ and then spironolactone.  Currently taking amlodipine 5 mg twice daily and taking 1/2 of a 6.25 mg carvedilol tablet (3.125 mg) twice daily.  [name] [name] last seen in January 2021.  She is still feeling well from a cardiac standpoint.  Noted stabbing chest discomfort.  Nonexertional.  Recent Hospitalizations: None  She was seen by Racquel [name], RPH in [facility] for lipid management --> started on Repatha.  (She has subsequently stopped taking it --citing concerns about insurance coverage)  Reviewed  CV studies:    The following studies were reviewed  today: (if available, images/films reviewed: From Epic Chart or Care Everywhere) . None:  Interval History:   RCaidence Kasemanreturns today well from a cardiac standpoint.  She injured her right knee and potentially need surgery.  However Dr. XErlinda Hongwas leery of doing surgery because of her clotting issues with lupus anticoagulant.  She is now walking with a cane.  As such see he is now deconditioned and get some dyspnea.  Thankfully though she has not had any chest pain even the musculoskeletal type stabbing pain. No palpitations.  No heart failure symptoms of PND orthopnea.  No edema.  R knee - injury: torn meniscus & bruised femur (walked into her door) - Ortho does not want to operate b/c Lupus Anticoagulant.  She has cut down her smoking significantly.  She is now maybe lighting up at the most 10 cigarettes, but as her husband mentions, she usually just less than burnout in the ashtray and only inhales about a third of it.  While last time she seemed interested in potentially restarting Repatha, at this point she is again reluctant. She is not taking either aspirin or Plavix now.  No bleeding issues.   CV Review of Symptoms (Summary): no chest pain or dyspnea on exertion positive for - Still has some anxiety with some palpitations but nothing significant negative for - edema, irregular heartbeat, orthopnea, paroxysmal nocturnal dyspnea, rapid heart rate, shortness of breath or Syncope/near syncope, TIA Summers fugax, claudication.  The patient DOES NOT have symptoms concerning for COVID-19 infection (fever, chills, cough, or new  shortness of breath).  The patient is practicing social distancing & Masking.   She is very careful when she goes out for groceries/shopping, and while at work.  COVID vaccine - Jan Feb --> pending booster  REVIEWED OF SYSTEMS   A comprehensive ROS was performed. Review of Systems  Constitutional: Negative for chills, fever, malaise/fatigue and weight loss.   HENT: Negative for congestion and nosebleeds.   Respiratory: Negative for cough and shortness of breath.   Cardiovascular:       Also notes blood pressure increasing  Gastrointestinal: Negative for abdominal pain, blood in stool, heartburn, melena and vomiting.  Genitourinary: Negative for hematuria.  Musculoskeletal: Positive for joint pain (R KNEE INJURY). Negative for falls.  Neurological: Negative for dizziness, focal weakness and weakness.  Endo/Heme/Allergies: Does not bruise/bleed easily.  Psychiatric/Behavioral: Negative for memory loss. The patient is nervous/anxious and has insomnia.        She still has a quite a stressful job even though she is only working part-time.  All other systems reviewed and are negative.  I have reviewed and (if needed) personally updated the patient's problem list, medications, allergies, past medical and surgical history, social and family history.   PAST MEDICAL HISTORY   Past Medical History:  Diagnosis Date  . Back pain   . CAD S/P percutaneous coronary angioplasty 09/13/2018   09/13/2018 - Cath & Staged DES PCI on 09/14/2018: DES PCI: Cx99%&55% - Resolute Onyx DES 3.5 x 30 (3.6 mm), p-mLAD (prox of D1 almost to D2) - Resolute Onyx DES 3.0 x 22 (3.3 mm); DFR on pRCA 70% - 0.99, Not significant -> Rec Med Rx.  . DVT (deep venous thrombosis) ([place])   . Heart attack ([place]) 09/09/2018  . Hypertension   . Lupus anticoagulant syndrome ([place])   . NSTEMI (non-ST elevated myocardial infarction) ([place]) 09/12/2018   Initial presentation with chest pain was on 09/09/2018, recurrent pain on 1/26 2020 with positive troponins.  Cath showed three-vessel CAD -> declined CABG, opted for two-vessel DES PCI (LAD and CX, negative DFR RCA)  . Pulmonary embolism ([place])     PAST SURGICAL HISTORY   Past Surgical History:  Procedure Laterality Date  . BACK SURGERY    . COLONOSCOPY  05/19/2012   Procedure: COLONOSCOPY;  Surgeon: Beryle Beams, MD;  Location: [place];  Service: Endoscopy;  Laterality: N/A;  . COLONOSCOPY WITH PROPOFOL N/A 09/30/2018   Procedure: COLONOSCOPY WITH PROPOFOL;  Surgeon: Yetta Flock, MD;  Location: [place];  Service: Gastroenterology;  Laterality: N/A;  . CORONARY STENT INTERVENTION N/A 09/14/2018   Procedure: CORONARY STENT INTERVENTION;  Surgeon: Martinique, Peter M, MD;  Location: [place] [facility];  Service: Cardiovascular: September 14, 2018- DES PCI: Cx99%&55% - Resolute Onyx DES 3.5 x 30 (3.6 mm), p-mLAD (prox of D1 almost to D2) - Resolute Onyx DES 3.0 x 22 (3.3 mm); DFR on pRCA 70% - 0.99, Not significant -> Rec Med Rx.  . ESOPHAGOGASTRODUODENOSCOPY  05/19/2012   Procedure: ESOPHAGOGASTRODUODENOSCOPY (EGD);  Surgeon: Beryle Beams, MD;  Location: [facility] ENDOSCOPY;  Service: Endoscopy;  Laterality: N/A;  . ESOPHAGOGASTRODUODENOSCOPY (EGD) WITH PROPOFOL N/A 09/30/2018   Procedure: ESOPHAGOGASTRODUODENOSCOPY (EGD) WITH PROPOFOL;  Surgeon: Yetta Flock, MD;  Location: [place];  Service: Gastroenterology;  Laterality: N/A;  . GIVENS CAPSULE STUDY N/A 09/30/2018   Procedure: GIVENS CAPSULE STUDY;  Surgeon: Yetta Flock, MD;  Location: [place];  Service: Gastroenterology;  Laterality: N/A;  . HOT HEMOSTASIS N/A 09/30/2018  Procedure: HOT HEMOSTASIS (ARGON PLASMA COAGULATION/BICAP);  Surgeon: Yetta Flock, MD;  Location: [facility] ENDOSCOPY;  Service: Gastroenterology;  Laterality: N/A;  . INTRAVASCULAR PRESSURE WIRE/FFR STUDY N/A 09/14/2018   Procedure: INTRAVASCULAR PRESSURE WIRE/FFR STUDY;  Surgeon: Martinique, Peter M, MD;  Location: [place] [facility];  Service: Cardiovascular;  Laterality: RCA: DFR on pRCA 70% - 0.99, Not significant -> Rec Med Rx.  Marland Kitchen LEFT HEART CATH AND CORONARY ANGIOGRAPHY N/A 09/13/2018   Procedure: LEFT HEART CATH AND CORONARY ANGIOGRAPHY;  Surgeon: Leonie Man, MD;  Location: [place] [facility];  Service: Cardiovascular:: CULPRIT LESION 99% p-mCx followed by 55%mCx; pLAD  35% A D24fllowed by long 80% lesion @ SP1); pRCA 70%. Normal LVEDP. Global HK EF ~45%.  -Decision on PCI deferred until discussion about PCI versus CABG.  Concern because of long-term DOAC.  .Marland KitchenTRANSTHORACIC ECHOCARDIOGRAM  09/13/2018   Mildly reduced EF 45 to 50% with diffuse HK.  GR 1 DD.  Mild aortic valve calcification.  MAC.  Moderate pulmonic regurgitation.   Diagnostic1/27Intervention1/28: PCI Cx & LAD, RCA DFR negative.     MEDICATIONS/ALLERGIES   Current Meds  Medication Sig  . Acetaminophen (ACETAMIN PO) Take by mouth.  .Marland KitchenamLODipine (NORVASC) 5 MG tablet Take 1 tablet (5 mg total) by mouth 2 (two) times daily.  . carvedilol (COREG) 6.25 MG tablet Take 0.5 tablets (3.125 mg total) by mouth 2 (two) times daily with a meal.  . lisinopril (ZESTRIL) 40 MG tablet Take 1 tablet (40 mg total) by mouth daily.  . nitroGLYCERIN (NITROSTAT) 0.4 MG SL tablet Place 1 tablet (0.4 mg total) under the tongue every 5 (five) minutes as needed for up to 3 doses for chest pain.  . pantoprazole (PROTONIX) 20 MG tablet Take 1 tablet (20 mg total) by mouth daily.  . rivaroxaban (XARELTO) 20 MG TABS tablet Take 1 tablet (20 mg total) by mouth daily with supper.  . [DISCONTINUED] clopidogrel (PLAVIX) 75 MG tablet Take 37.5 mg by mouth daily.  . [DISCONTINUED] icosapent Ethyl (VASCEPA) 1 g capsule Take 2 capsules (2 g total) by mouth 2 (two) times daily.  . [DISCONTINUED] pantoprazole (PROTONIX) 40 MG tablet Take 40 mg by mouth daily.    Allergies  Allergen Reactions  . Prednisone Other (See Comments)    No energy "stalls out".    Makes pt  Jittery.  . Tramadol Other (See Comments)    Constipation,  Makes pt  Jittery.  . Crestor [Rosuvastatin Calcium]     Feels fatigued, RE  CHALLENGE - UNABLE TO USE 12/28/18  . Hctz [Hydrochlorothiazide]     Dizzy   . Repatha [Evolocumab]     SOCIAL HISTORY/FAMILY HISTORY   Social History   Tobacco Use  . Smoking status: Current Every Day Smoker     Packs/day: 0.50    Years: 29.00    Pack years: 14.50    Types: Cigarettes  . Smokeless tobacco: Never Used  . Tobacco comment: Cut back to quarter pack a day  Vaping Use  . Vaping Use: Never used  Substance Use Topics  . Alcohol use: Yes    Comment: Occasional  . Drug use: No   Social History   Social History Narrative   Really is a married mother of 2.  1 son died from complications of spina bifida after multiple surgeries.   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     Drinks social alcohol & denies drug use.     Pt endorses smoking maybe 2 cigarettes/day.  States she placed most of the cigarette, may have 2 puffs.    Family History family history includes Cancer in her father and paternal grandfather; Leukemia in her mother and paternal grandmother; Lung cancer in her maternal grandfather; Prostate cancer in her father; Spina bifida in her son; Stroke in her maternal grandmother.   OBJCTIVE -PE, EKG, labs   Wt Readings from Last 3 Encounters:  04/13/20 160 lb (72.6 kg)  03/29/20 161 lb (73 kg)  03/16/20 158 lb (71.7 kg)    Physical Exam: BP 130/82 (BP Location: Left Arm, Patient Position: Sitting, Cuff Size: Normal)   Pulse 90   Ht _0  (1.626 m)   Wt 160 lb (72.6 kg)   BMI 27.46 kg/m   --Noted home blood pressure today was 140/70.  Apparently her pressures have been much better at home than they are here. Physical Exam Vitals reviewed.  Constitutional:      General: She is not in acute distress.    Appearance: Normal appearance. She is well-developed and normal weight. She is not ill-appearing.     Comments: Healthy-appearing..  Well-groomed.  HENT:     Head: Normocephalic and atraumatic.  Neck:     Thyroid: No thyromegaly.     Vascular: No carotid bruit, hepatojugular reflux or JVD.     Comments: No JVD or HJR Cardiovascular:     Rate and Rhythm: Normal rate and regular rhythm.   No extrasystoles are present.    Chest Wall: PMI is not displaced.     Pulses: Normal pulses and intact distal pulses.     Heart sounds: Normal heart sounds. No murmur heard.  No friction rub. No gallop.      Comments: No significant murmur heard Pulmonary:     Effort: Pulmonary effort is normal. No respiratory distress.     Breath sounds: Normal breath sounds. No wheezing or rales.  Musculoskeletal:        General: Signs of injury (r KNEE in brace) present. No swelling. Normal range of motion.     Cervical back: Normal range of motion and neck supple.  Neurological:     General: No focal deficit present.     Mental Status: She is alert and oriented to person, place, and time.  Psychiatric:        Mood and Affect: Mood normal.        Behavior: Behavior normal.        Thought Content: Thought content normal.        Judgment: Judgment normal.     Adult ECG Report Not checked.  Recent Labs:    Lab Results  Component Value Date   CHOL 408 (H) 06/01/2019   HDL 30 (L) 06/01/2019   LDLCALC 201 (H) 06/01/2019   LDLDIRECT 141.0 11/25/2018   TRIG 769 (HH) 06/01/2019   CHOLHDL 13.6 (H) 06/01/2019    Lab Results  Component Value Date   CREATININE 0.69 06/01/2019   BUN 7 (L) 06/01/2019   NA 138 06/01/2019   K 4.5 06/01/2019   CL 100 06/01/2019   CO2 23 06/01/2019    ASSESSMENT/PLAN    Problem List Items Addressed This Visit    Dyslipidemia, goal LDL below 70 (Chronic)    I am not really sure what brand do here.  I do think that we need to eventually get her on Repatha  or Praluent.  She had previously agreed to start [place] but somehow never started.  For now we will start off with starting fish oil (3 g a day) and co-Q10. -> We will refer back to CVRR.  We will likely start Nexletol based on follow-up results to be checked in October.  Eventually we will have her go back to [facility] with plans to reinitiate PCSK9 inhibitor if the lipids are simply not controlled.       Relevant Orders   EKG 12-Lead (Completed)   Lipid panel   Comprehensive metabolic panel   Hepatic function panel   Lipid panel   Tobacco use (Chronic)   Essential hypertension (Chronic)    Blood pressure is borderline right now on current meds.  We will continue current dosing.  She is to monitor her blood pressures to make sure they do not continue to go up.  We may need to increase to 10 mg of amlodipine.      Hypercoagulation syndrome (HCC) (Chronic)    Lupus anticoagulant.  On lifelong Xarelto.  Now no longer on antiplatelet.      Hypertriglyceridemia (Chronic)    Significantly elevated triglycerides by last check.  She did not tolerate Vascepa for reasons I am not sure.  We will simply try to start with fish oil and then consider Nexletol.      Relevant Orders   EKG 12-Lead (Completed)   Lipid panel   Comprehensive metabolic panel   Hepatic function panel   Lipid panel   CAD -S/P PCI - Primary (Chronic)    Just over a year and a half out from two-vessel PCI in the setting of a non-STEMI. We stopped Plavix in January and continued Xarelto.  She was very leery of being on antiplatelet agent.  We had a long talk when it came to deciding about PCI versus CABG with the understanding that she would need to be on dual antiplatelets for least a year, but despite that she did not want to continue for the full year and was taking 1/2 tablet Plavix a day.  Thankfully, she has not had any issues with in-stent thrombosis.  Now no longer on antiplatelet agent.  Only on Xarelto which is for lupus anticoagulant. On low-dose beta-blocker and ACE inhibitor along with amlodipine.  Need to initiate antiplatelet agent.      Relevant Orders   EKG 12-Lead (Completed)   Comprehensive metabolic panel   Hepatic function panel   Lipid panel   Antiplatelet or antithrombotic long-term use (Chronic)   LA (lupus anticoagulant) disorder (HCC)   Relevant Orders   Comprehensive metabolic panel    Encounter for smoking cessation counseling    We talked for about 3 to 4 minutes about the topic of her smoking.  She makes excuses in arguments indicating that she says he does not really smoke that much.  I basely explained to her that with her lipids being is out of control as they are, we have to control but we can control and that means smoking cessation.  I talked about potentially doing scaly cigarettes that are brought on a time, we talked about Chantix which she is not willing to try.  She is on the try patches.  So simply require her to just cut down and stop.  She needs to set a quit date.        Major issue is her lipid management.  We spent a long time talking about lipids and smoking cessation.  COVID-19 Education: The signs and symptoms of COVID-19 were discussed with the patient and how to seek care for testing (follow up with PCP or arrange E-visit).   The importance of social distancing was discussed today.  I spent a total of 56mnutes with the patient and chart review. >  50% of the time was spent in direct patient consultation.  Additional time spent with chart review (studies, outside notes, etc): 8 Total Time: 34 min   Current medicines are reviewed at length with the patient today.  (+/- concerns) n/a   Patient Instructions / Medication Changes & Studies & Tests Ordered   Patient Instructions  Medication Instructions:   start taking  Fish oil - omega 3 -- 3000 mg  May take all at one time or divide through out the day  Start taking CoQ10  300 mg  Take daily   *If you need a refill on your cardiac medications before your next appointment, please call your pharmacy*   Lab Work:   in Oct 2021- fasting labs  Lipid cmp   In Feb 2022- fasting Lipid hepatic If you have labs (blood work) drawn today and your tests are completely normal, you will receive your results only by: .Marland KitchenMyChart Message (if you have MyChart) OR . A paper copy in the mail If you have  any lab test that is abnormal or we need to change your treatment, we will call you to review the results.   Testing/Procedures: Not needed   Follow-Up: [facility] and your [facility] needs are our priority.  As part of our continuing mission to provide you with exceptional heart care, we have created designated Provider Care Teams.  These Care Teams include your primary Cardiologist (physician) and Advanced Practice Providers (APPs -  Physician Assistants and Nurse Practitioners) who all work together to provide you with the care you need, when you need it.  We recommend signing up for the patient portal called "MyChart".  Sign up information is provided on this After Visit Summary.  MyChart is used to connect with patients for Virtual Visits (Telemedicine).  Patients are able to view lab/test results, encounter notes, upcoming appointments, etc.  Non-urgent messages can be sent to your provider as well.   To learn more about what you can do with MyChart, go to [email or web address]    Your next appointment:   6 month(s) feb 2022  The format for your next appointment:   In Person  Provider:   DGlenetta Hew MD   Other Instructions     Studies Ordered:   Orders Placed This Encounter  Procedures  . Lipid panel  . Comprehensive metabolic panel  . Hepatic function panel  . Lipid panel  . EKG 12-Lead     DGlenetta Hew M.D., M.S. Interventional Cardiologist   Pager # [phone] # [phone] [address] [place] [address]  [facility] you [facility]!

## 2020-04-13 NOTE — Patient Instructions (Addendum)
Medication Instructions:   start taking  Fish oil - omega 3 -- 3000 mg  May take all at one time or divide through out the day  Start taking CoQ10  300 mg  Take daily   *If you need a refill on your cardiac medications before your next appointment, please call your pharmacy*   Lab Work:   in Oct 2021- fasting labs  Lipid cmp   In Feb 2022- fasting Lipid hepatic If you have labs (blood work) drawn today and your tests are completely normal, you will receive your results only by: Marland Kitchen MyChart Message (if you have MyChart) OR . A paper copy in the mail If you have any lab test that is abnormal or we need to change your treatment, we will call you to review the results.   Testing/Procedures: Not needed   Follow-Up: At [facility], you and your health needs are our priority.  As part of our continuing mission to provide you with exceptional heart care, we have created designated Provider Care Teams.  These Care Teams include your primary Cardiologist (physician) and Advanced Practice Providers (APPs -  Physician Assistants and Nurse Practitioners) who all work together to provide you with the care you need, when you need it.  We recommend signing up for the patient portal called "MyChart".  Sign up information is provided on this After Visit Summary.  MyChart is used to connect with patients for Virtual Visits (Telemedicine).  Patients are able to view lab/test results, encounter notes, upcoming appointments, etc.  Non-urgent messages can be sent to your provider as well.   To learn more about what you can do with MyChart, go to [email or web address].    Your next appointment:   6 month(s) feb 2022  The format for your next appointment:   In Person  Provider:   Glenetta Hew, MD   Other Instructions

## 2020-04-20 ENCOUNTER — Ambulatory Visit: Payer: 59 | Admitting: Orthopaedic Surgery

## 2020-04-20 ENCOUNTER — Encounter: Payer: Self-pay | Admitting: Cardiology

## 2020-04-20 DIAGNOSIS — Z716 Tobacco abuse counseling: Secondary | ICD-10-CM | POA: Insufficient documentation

## 2020-04-20 NOTE — Assessment & Plan Note (Signed)
Blood pressure is borderline right now on current meds.  We will continue current dosing.  She is to monitor her blood pressures to make sure they do not continue to go up.  We may need to increase to 10 mg of amlodipine.

## 2020-04-20 NOTE — Assessment & Plan Note (Signed)
Lupus anticoagulant.  On lifelong Xarelto.  Now no longer on antiplatelet.

## 2020-04-20 NOTE — Assessment & Plan Note (Signed)
We talked for about 3 to 4 minutes about the topic of her smoking.  She makes excuses in arguments indicating that she says he does not really smoke that much.  I basely explained to her that with her lipids being is out of control as they are, we have to control but we can control and that means smoking cessation.  I talked about potentially doing scaly cigarettes that are brought on a time, we talked about Chantix which she is not willing to try.  She is on the try patches.  So simply require her to just cut down and stop.  She needs to set a quit date.

## 2020-04-20 NOTE — Assessment & Plan Note (Addendum)
Just over a year and a half out from two-vessel PCI in the setting of a non-STEMI. We stopped Plavix in January and continued Xarelto.  She was very leery of being on antiplatelet agent.  We had a long talk when it came to deciding about PCI versus CABG with the understanding that she would need to be on dual antiplatelets for least a year, but despite that she did not want to continue for the full year and was taking 1/2 tablet Plavix a day.  Thankfully, she has not had any issues with in-stent thrombosis.  Now no longer on antiplatelet agent.  Only on Xarelto which is for lupus anticoagulant. On low-dose beta-blocker and ACE inhibitor along with amlodipine.  Need to initiate antiplatelet agent.

## 2020-04-20 NOTE — Assessment & Plan Note (Signed)
I am not really sure what brand do here.  I do think that we need to eventually get her on Repatha or Praluent.  She had previously agreed to start [place] but somehow never started.  For now we will start off with starting fish oil (3 g a day) and co-Q10. -> We will refer back to CVRR.  We will likely start Nexletol based on follow-up results to be checked in October.  Eventually we will have her go back to CV[facility] with plans to reinitiate PCSK9 inhibitor if the lipids are simply not controlled.

## 2020-04-20 NOTE — Assessment & Plan Note (Signed)
Significantly elevated triglycerides by last check.  She did not tolerate Vascepa for reasons I am not sure.  We will simply try to start with fish oil and then consider Nexletol.

## 2020-05-10 ENCOUNTER — Ambulatory Visit (INDEPENDENT_AMBULATORY_CARE_PROVIDER_SITE_OTHER): Payer: 59 | Admitting: Orthopaedic Surgery

## 2020-05-10 ENCOUNTER — Encounter: Payer: Self-pay | Admitting: Orthopaedic Surgery

## 2020-05-10 DIAGNOSIS — G8929 Other chronic pain: Secondary | ICD-10-CM | POA: Diagnosis not present

## 2020-05-10 DIAGNOSIS — M25561 Pain in right knee: Secondary | ICD-10-CM | POA: Diagnosis not present

## 2020-05-10 NOTE — Progress Notes (Signed)
Office Visit Note   Patient: [name] [name]           Date of Birth: 13-Mar-1956           MRN: [id number] Visit Date: 05/10/2020              Requested by: Billie Ruddy, MD [place],  [place] [id number] PCP: Billie Ruddy, MD   Assessment & Plan: Visit Diagnoses:  1. Chronic pain of right knee     Plan: Impression is improved right knee pain.  At this point she will continue to treat this symptomatically with medications as needed.  She will also work on weight loss to improve knee pain as well.  Follow-up as needed.  Follow-Up Instructions: Return if symptoms worsen or fail to improve.   Orders:  No orders of the defined types were placed in this encounter.  No orders of the defined types were placed in this encounter.     Procedures: No procedures performed   Clinical Data: No additional findings.   Subjective: Chief Complaint  Patient presents with  . Right Knee - Pain    [name] [name] returns today for follow-up of her right knee pain.  Overall doing well.  Never got the unloader brace but she does not feel like she needs it since she is feeling much better.  Currently working part-time.  Uses Voltaren gel as needed.   Review of Systems   Objective: Vital Signs: There were no vitals taken for this visit.  Physical Exam  Ortho Exam Right knee shows no joint effusion.  No significant tenderness to palpation to the medial femoral condyle.  Painless range of motion. Specialty Comments:  No specialty comments available.  Imaging: No results found.   PMFS History: Patient Active Problem List   Diagnosis Date Noted  . Encounter for smoking cessation counseling 04/20/2020  . Accelerated hypertension 05/29/2019  . LA (lupus anticoagulant) disorder ([place]) 05/29/2019  . Anxiety about health 05/27/2019  . Heme positive stool   . Antiplatelet or antithrombotic long-term use   . Acute on chronic anemia 09/28/2018  . CAD -S/P PCI  09/24/2018  . Non-ST elevation (NSTEMI) myocardial infarction ([place])   . History of pulmonary embolus (PE) 09/12/2018  . GERD (gastroesophageal reflux disease) 12/25/2016  . Microcytic anemia 01/24/2016  . Hypertriglyceridemia 01/24/2016  . Dyslipidemia, goal LDL below 70 06/21/2015  . History of DVT of lower extremity   . Edema of left lower extremity   . Hypercoagulation syndrome ([place]) 05/19/2012  . Iron deficiency 05/19/2012  . Thrombocytosis ([place]) 05/19/2012  . Acute lower GI bleeding 05/18/2012  . Sinus tachycardia 04/21/2012  . Hypokalemia 04/20/2012  . Tobacco use 04/20/2012  . Essential hypertension 04/20/2012   Past Medical History:  Diagnosis Date  . Back pain   . CAD S/P percutaneous coronary angioplasty 09/13/2018   09/13/2018 - Cath & Staged DES PCI on 09/14/2018: DES PCI: Cx99%&55% - Resolute Onyx DES 3.5 x 30 (3.6 mm), p-mLAD (prox of D1 almost to D2) - Resolute Onyx DES 3.0 x 22 (3.3 mm); DFR on pRCA 70% - 0.99, Not significant -> Rec Med Rx.  . DVT (deep venous thrombosis) ([place])   . Heart attack ([place]) 09/09/2018  . Hypertension   . Lupus anticoagulant syndrome ([place])   . NSTEMI (non-ST elevated myocardial infarction) ([place]) 09/12/2018   Initial presentation with chest pain was on 09/09/2018, recurrent pain on 1/26 2020 with positive troponins.  Cath showed three-vessel CAD ->  declined CABG, opted for two-vessel DES PCI (LAD and CX, negative DFR RCA)  . Pulmonary embolism ([facility])     Family History  Problem Relation Age of Onset  . Leukemia Mother   . Prostate cancer Father   . Cancer Father   . Stroke Maternal Grandmother   . Lung cancer Maternal Grandfather   . Leukemia Paternal Grandmother   . Cancer Paternal Grandfather   . Spina bifida Son        Multiple surgeries  . Colon cancer Neg Hx   . Esophageal cancer Neg Hx     Past Surgical History:  Procedure Laterality Date  . BACK SURGERY    . COLONOSCOPY  05/19/2012   Procedure: COLONOSCOPY;  Surgeon: Beryle Beams, MD;  Location: [place];  Service: Endoscopy;  Laterality: N/A;  . COLONOSCOPY WITH PROPOFOL N/A 09/30/2018   Procedure: COLONOSCOPY WITH PROPOFOL;  Surgeon: Yetta Flock, MD;  Location: [place];  Service: Gastroenterology;  Laterality: N/A;  . CORONARY STENT INTERVENTION N/A 09/14/2018   Procedure: CORONARY STENT INTERVENTION;  Surgeon: Martinique, Peter M, MD;  Location: [place] CV [facility];  Service: Cardiovascular: September 14, 2018- DES PCI: Cx99%&55% - Resolute Onyx DES 3.5 x 30 (3.6 mm), p-mLAD (prox of D1 almost to D2) - Resolute Onyx DES 3.0 x 22 (3.3 mm); DFR on pRCA 70% - 0.99, Not significant -> Rec Med Rx.  . ESOPHAGOGASTRODUODENOSCOPY  05/19/2012   Procedure: ESOPHAGOGASTRODUODENOSCOPY (EGD);  Surgeon: Beryle Beams, MD;  Location: [facility] ENDOSCOPY;  Service: Endoscopy;  Laterality: N/A;  . ESOPHAGOGASTRODUODENOSCOPY (EGD) WITH PROPOFOL N/A 09/30/2018   Procedure: ESOPHAGOGASTRODUODENOSCOPY (EGD) WITH PROPOFOL;  Surgeon: Yetta Flock, MD;  Location: [place];  Service: Gastroenterology;  Laterality: N/A;  . GIVENS CAPSULE STUDY N/A 09/30/2018   Procedure: GIVENS CAPSULE STUDY;  Surgeon: Yetta Flock, MD;  Location: [place];  Service: Gastroenterology;  Laterality: N/A;  . HOT HEMOSTASIS N/A 09/30/2018   Procedure: HOT HEMOSTASIS (ARGON PLASMA COAGULATION/BICAP);  Surgeon: Yetta Flock, MD;  Location: [facility] ENDOSCOPY;  Service: Gastroenterology;  Laterality: N/A;  . INTRAVASCULAR PRESSURE WIRE/FFR STUDY N/A 09/14/2018   Procedure: INTRAVASCULAR PRESSURE WIRE/FFR STUDY;  Surgeon: Martinique, Peter M, MD;  Location: [place] CV LAB;  Service: Cardiovascular;  Laterality: RCA: DFR on pRCA 70% - 0.99, Not significant -> Rec Med Rx.  Marland Kitchen LEFT HEART CATH AND CORONARY ANGIOGRAPHY N/A 09/13/2018   Procedure: LEFT HEART CATH AND CORONARY ANGIOGRAPHY;  Surgeon: Leonie Man, MD;  Location: [place] CV LAB;  Service: Cardiovascular:: CULPRIT LESION 99%  p-mCx followed by 55%mCx; pLAD 35% A D90fllowed by long 80% lesion @ SP1); pRCA 70%. Normal LVEDP. Global HK EF ~45%.  -Decision on PCI deferred until discussion about PCI versus CABG.  Concern because of long-term DOAC.  .Marland KitchenTRANSTHORACIC ECHOCARDIOGRAM  09/13/2018   Mildly reduced EF 45 to 50% with diffuse HK.  GR 1 DD.  Mild aortic valve calcification.  MAC.  Moderate pulmonic regurgitation.   Social History   Occupational History  . Occupation: SERVICE MANAGER    Employer: [employer]  Tobacco Use  . Smoking status: Current Every Day Smoker    Packs/day: 0.50    Years: 29.00    Pack years: 14.50    Types: Cigarettes  . Smokeless tobacco: Never Used  . Tobacco comment: Cut back to quarter pack a day  Vaping Use  . Vaping Use: Never used  Substance and Sexual Activity  . Alcohol use: Yes  Comment: Occasional  . Drug use: No  . Sexual activity: Not on file

## 2020-06-13 ENCOUNTER — Other Ambulatory Visit: Payer: Self-pay | Admitting: Cardiology

## 2020-06-14 ENCOUNTER — Other Ambulatory Visit: Payer: Self-pay

## 2020-06-14 DIAGNOSIS — E785 Hyperlipidemia, unspecified: Secondary | ICD-10-CM

## 2020-06-14 DIAGNOSIS — Z79899 Other long term (current) drug therapy: Secondary | ICD-10-CM

## 2020-06-14 LAB — COMPREHENSIVE METABOLIC PANEL
ALT: 9 IU/L (ref 0–32)
AST: 14 IU/L (ref 0–40)
Albumin/Globulin Ratio: 1.3 (ref 1.2–2.2)
Albumin: 4.3 g/dL (ref 3.8–4.8)
Alkaline Phosphatase: 145 IU/L — ABNORMAL HIGH (ref 44–121)
BUN/Creatinine Ratio: 9 — ABNORMAL LOW (ref 12–28)
BUN: 7 mg/dL — ABNORMAL LOW (ref 8–27)
Bilirubin Total: 0.3 mg/dL (ref 0.0–1.2)
CO2: 21 mmol/L (ref 20–29)
Calcium: 9.9 mg/dL (ref 8.7–10.3)
Chloride: 100 mmol/L (ref 96–106)
Creatinine, Ser: 0.74 mg/dL (ref 0.57–1.00)
GFR calc Af Amer: 98 mL/min/{1.73_m2} (ref >59)
GFR calc non Af Amer: 85 mL/min/{1.73_m2} (ref >59)
Globulin, Total: 3.4 g/dL (ref 1.5–4.5)
Glucose: 87 mg/dL (ref 65–99)
Potassium: 4.2 mmol/L (ref 3.5–5.2)
Sodium: 138 mmol/L (ref 134–144)
Total Protein: 7.7 g/dL (ref 6.0–8.5)

## 2020-06-14 LAB — LIPID PANEL
Chol/HDL Ratio: 13.8 ratio — ABNORMAL HIGH (ref 0.0–4.4)
Cholesterol, Total: 373 mg/dL — ABNORMAL HIGH (ref 100–199)
HDL: 27 mg/dL — ABNORMAL LOW (ref >39)
LDL Chol Calc (NIH): 179 mg/dL — ABNORMAL HIGH (ref 0–99)
Triglycerides: 770 mg/dL (ref 0–149)
VLDL Cholesterol Cal: 167 mg/dL — ABNORMAL HIGH (ref 5–40)

## 2020-06-14 LAB — HEPATIC FUNCTION PANEL: Bilirubin, Direct: <0.1 mg/dL (ref 0.00–0.40)

## 2020-06-22 ENCOUNTER — Telehealth: Payer: Self-pay | Admitting: Cardiology

## 2020-06-22 NOTE — Telephone Encounter (Signed)
Spoke with patient. Reviewed lab results. Appointment made for Dr. Lysbeth Penner lipid clinic on 07/19/20. Patient verbalized understanding.

## 2020-06-22 NOTE — Telephone Encounter (Signed)
Pt is calling to get lab results.

## 2020-07-19 ENCOUNTER — Encounter: Payer: Self-pay | Admitting: Internal Medicine

## 2020-07-19 ENCOUNTER — Other Ambulatory Visit: Payer: Self-pay

## 2020-07-19 ENCOUNTER — Ambulatory Visit (INDEPENDENT_AMBULATORY_CARE_PROVIDER_SITE_OTHER): Payer: Medicare Other | Admitting: Internal Medicine

## 2020-07-19 VITALS — BP 130/82 | HR 95 | Ht 64.5 in | Wt 166.0 lb

## 2020-07-19 DIAGNOSIS — D6862 Lupus anticoagulant syndrome: Secondary | ICD-10-CM | POA: Diagnosis not present

## 2020-07-19 DIAGNOSIS — E785 Hyperlipidemia, unspecified: Secondary | ICD-10-CM | POA: Diagnosis not present

## 2020-07-19 DIAGNOSIS — I214 Non-ST elevation (NSTEMI) myocardial infarction: Secondary | ICD-10-CM

## 2020-07-19 DIAGNOSIS — E7849 Other hyperlipidemia: Secondary | ICD-10-CM

## 2020-07-19 DIAGNOSIS — E781 Pure hyperglyceridemia: Secondary | ICD-10-CM

## 2020-07-19 MED ORDER — NITROGLYCERIN 0.4 MG SL SUBL: 0.4000 mg | SUBLINGUAL_TABLET | SUBLINGUAL | 5 refills | Status: DC | PRN

## 2020-07-19 MED ORDER — NEXLETOL 180 MG PO TABS: 1.0000 | ORAL_TABLET | Freq: Every day | ORAL | 11 refills | Status: DC

## 2020-07-19 NOTE — Progress Notes (Signed)
LIPID CLINIC CONSULT NOTE  Chief Complaint:  Manage dyslipidemia  Primary Care Physician: Billie Ruddy, MD  Primary Cardiologist:  Glenetta Hew, MD  HPI:  [name] [name] is a 64 y.o. female who is being seen today for the evaluation of dyslipidemia at the request of Glenetta Hew, MD. this is a 64 year old female patient of Dr. Ellyn Hack with a history of coronary artery disease, DVT and multiple emboli, lupus anticoagulant syndrome and long-term anticoagulation on Xarelto.  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  She also has numerous medication intolerance.  She has been seen previously by our pharmacist in the lipid disorders clinic, noting intolerance to atorvastatin 40 mg, rosuvastatin 40 mg, rosuvastatin 10 mg, PET-avid statin 4 mg, fenofibrate 90 mg, and fenofibrate 145 mg daily.  Her most recent lipid profile showed total cholesterol 373, triglycerides 770, HDL 27 and LDL 179.  She is also tried both Repatha and Praluent having side effects as well including myalgias and generalized fatigue.  PMHx:  Past Medical History:  Diagnosis Date  . Back pain   . CAD S/P percutaneous coronary angioplasty 09/13/2018   09/13/2018 - Cath & Staged DES PCI on 09/14/2018: DES PCI: Cx99%&55% - Resolute Onyx DES 3.5 x 30 (3.6 mm), p-mLAD (prox of D1 almost to D2) - Resolute Onyx DES 3.0 x 22 (3.3 mm); DFR on pRCA 70% - 0.99, Not significant -> Rec Med Rx.  . DVT (deep venous thrombosis) ([place])   . Heart attack ([place]) 09/09/2018  . Hypertension   . Lupus anticoagulant syndrome ([place])   . NSTEMI (non-ST elevated myocardial infarction) ([place]) 09/12/2018   Initial presentation with chest pain was on 09/09/2018, recurrent pain on 1/26 2020 with positive troponins.  Cath showed three-vessel CAD -> declined CABG, opted for two-vessel DES PCI (LAD and CX,  negative DFR RCA)  . Pulmonary embolism [facility])     Past Surgical History:  Procedure Laterality Date  . BACK SURGERY    . COLONOSCOPY  05/19/2012   Procedure: COLONOSCOPY;  Surgeon: Beryle Beams, MD;  Location: [place];  Service: Endoscopy;  Laterality: N/A;  . COLONOSCOPY WITH PROPOFOL N/A 09/30/2018   Procedure: COLONOSCOPY WITH PROPOFOL;  Surgeon: Yetta Flock, MD;  Location: [place];  Service: Gastroenterology;  Laterality: N/A;  . CORONARY STENT INTERVENTION N/A 09/14/2018   Procedure: CORONARY STENT INTERVENTION;  Surgeon: Martinique, Peter M, MD;  Location: [place] CV LAB;  Service: Cardiovascular: September 14, 2018- DES PCI: Cx99%&55% - Resolute Onyx DES 3.5 x 30 (3.6 mm), p-mLAD (prox of D1 almost to D2) - Resolute Onyx DES 3.0 x 22 (3.3 mm); DFR on pRCA 70% - 0.99, Not significant -> Rec Med Rx.  . ESOPHAGOGASTRODUODENOSCOPY  05/19/2012   Procedure: ESOPHAGOGASTRODUODENOSCOPY (EGD);  Surgeon: Beryle Beams, MD;  Location: [facility] ENDOSCOPY;  Service: Endoscopy;  Laterality: N/A;  . ESOPHAGOGASTRODUODENOSCOPY (EGD) WITH PROPOFOL N/A 09/30/2018   Procedure: ESOPHAGOGASTRODUODENOSCOPY (EGD) WITH PROPOFOL;  Surgeon: Yetta Flock, MD;  Location: [place];  Service: Gastroenterology;  Laterality: N/A;  . GIVENS CAPSULE STUDY N/A 09/30/2018   Procedure: GIVENS CAPSULE STUDY;  Surgeon: Yetta Flock, MD;  Location: [place];  Service: Gastroenterology;  Laterality: N/A;  . HOT HEMOSTASIS N/A 09/30/2018   Procedure: HOT HEMOSTASIS (ARGON PLASMA COAGULATION/BICAP);  Surgeon: Yetta Flock, MD;  Location: [facility] ENDOSCOPY;  Service: Gastroenterology;  Laterality: N/A;  .  INTRAVASCULAR PRESSURE WIRE/FFR STUDY N/A 09/14/2018   Procedure: INTRAVASCULAR PRESSURE WIRE/FFR STUDY;  Surgeon: Martinique, Peter M, MD;  Location: [place] CV LAB;  Service: Cardiovascular;  Laterality: RCA: DFR on pRCA 70% - 0.99, Not significant -> Rec Med Rx.  Marland Kitchen LEFT HEART CATH AND CORONARY  ANGIOGRAPHY N/A 09/13/2018   Procedure: LEFT HEART CATH AND CORONARY ANGIOGRAPHY;  Surgeon: Leonie Man, MD;  Location: [place] CV LAB;  Service: Cardiovascular:: CULPRIT LESION 99% p-mCx followed by 55%mCx; pLAD 35% A D40fllowed by long 80% lesion @ SP1); pRCA 70%. Normal LVEDP. Global HK EF ~45%.  -Decision on PCI deferred until discussion about PCI versus CABG.  Concern because of long-term DOAC.  .Marland KitchenTRANSTHORACIC ECHOCARDIOGRAM  09/13/2018   Mildly reduced EF 45 to 50% with diffuse HK.  GR 1 DD.  Mild aortic valve calcification.  MAC.  Moderate pulmonic regurgitation.    FAMHx:  Family History  Problem Relation Age of Onset  . Leukemia Mother   . Prostate cancer Father   . Cancer Father   . Stroke Maternal Grandmother   . Lung cancer Maternal Grandfather   . Leukemia Paternal Grandmother   . Cancer Paternal Grandfather   . Spina bifida Son        Multiple surgeries  . Colon cancer Neg Hx   . Esophageal cancer Neg Hx     SOCHx:   reports that she has been smoking cigarettes. She has a 14.50 pack-year smoking history. She has never used smokeless tobacco. She reports current alcohol use. She reports that she does not use drugs.  ALLERGIES:  Allergies  Allergen Reactions  . Prednisone Other (See Comments)    No energy "stalls out".    Makes pt  Jittery.  . Tramadol Other (See Comments)    Constipation,  Makes pt  Jittery.  . Crestor [Rosuvastatin Calcium]     Feels fatigued, RE  CHALLENGE - UNABLE TO USE 12/28/18  . Hctz [Hydrochlorothiazide]     Dizzy   . Repatha [Evolocumab]     ROS: Pertinent items noted in HPI and remainder of comprehensive ROS otherwise negative.  HOME MEDS: Current Outpatient Medications on File Prior to Visit  Medication Sig Dispense Refill  . Acetaminophen (ACETAMIN PO) Take by mouth.    .Marland KitchenamLODipine (NORVASC) 5 MG tablet TAKE 1 TABLET BY MOUTH TWICE A DAY 180 tablet 1  . carvedilol (COREG) 6.25 MG tablet Take 0.5 tablets (3.125 mg  total) by mouth 2 (two) times daily with a meal. 90 tablet 3  . Coenzyme Q10 (COQ10) 100 MG CAPS Take 300 capsules by mouth daily. 90 capsule 11  . lisinopril (ZESTRIL) 40 MG tablet Take 1 tablet (40 mg total) by mouth daily. 90 tablet 1  . Omega-3 Fatty Acids (FISH OIL) 1000 MG CPDR Take 3,000 capsules by mouth daily. 90 capsule 11  . pantoprazole (PROTONIX) 20 MG tablet Take 1 tablet (20 mg total) by mouth daily. 90 tablet 1  . rivaroxaban (XARELTO) 20 MG TABS tablet Take 1 tablet (20 mg total) by mouth daily with supper. 30 tablet 11   No current facility-administered medications on file prior to visit.    LABS/IMAGING: No results found for this or any previous visit (from the past 48 hour(s)). No results found.  LIPID PANEL:    Component Value Date/Time   CHOL 373 (H) 06/14/2020 0955   TRIG 770 (HH) 06/14/2020 0955   TRIG 681 (HH) 10/27/2014 1155   HDL 27 (L) 06/14/2020 03762  HDL 29 (L) 10/27/2014 1155   CHOLHDL 13.8 (H) 06/14/2020 0955   CHOLHDL 8 11/25/2018 1008   VLDL 74 (H) 09/13/2018 0144   LDLCALC 179 (H) 06/14/2020 0955   LDLCALC NOTES: 08/25/2013 1612   LDLDIRECT 141.0 11/25/2018 1008    WEIGHTS: Wt Readings from Last 3 Encounters:  07/19/20 166 lb (75.3 kg)  04/13/20 160 lb (72.6 kg)  03/29/20 161 lb (73 kg)    VITALS: BP 130/82   Pulse 95   Ht 5' 4.5" (1.638 m)   Wt 166 lb (75.3 kg)   SpO2 99%   BMI 28.05 kg/m   EXAM: General appearance: alert and no distress Neck: no carotid bruit, no JVD and thyroid not enlarged, symmetric, no tenderness/mass/nodules Lungs: clear to auscultation bilaterally Heart: regular rate and rhythm, S1, S2 normal, no murmur, click, rub or gallop Abdomen: soft, non-tender; bowel sounds normal; no masses,  no organomegaly Extremities: extremities normal, atraumatic, no cyanosis or edema Pulses: 2+ and symmetric Skin: Skin color, texture, turgor normal. No rashes or lesions Neurologic: Grossly normal Psych:  Pleasant  EKG: Deferred  ASSESSMENT: 1. 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 Multistatin and PCSK9 inhibitor intolerance-myalgias, fatigue, etc. 3. History of coronary artery disease with PCI 4. History of DVTs 5. Lupus anticoagulant syndrome  PLAN: 1.   Ms. Manner has a probable familial combined hyperlipidemia.  Unfortunately she has had multistatin and PCSK9 inhibitor intolerance.  Options for her are very limited.  She might be a candidate for inclisiran, a short interfering RNA to PCSK9.  This is expected to be available shortly.  For now, we might consider bempedoic acid 180 mg daily.  Cost might be an issue since she is now on Medicare.  We will need to get prior authorization for this.  Would also like to check her thyroid, vitamin D and other studies to make sure there is not any other clear reversible cause.  Follow-up in 3 to 4 months with repeat lipids.  Thanks for the kind referral.  [name] Casino, MD, FACC, [place] Director of the Advanced Lipid Disorders &  Cardiovascular Risk Reduction Clinic Diplomate of the American Board of Clinical Lipidology Attending Cardiologist  Direct Dial: [phone]  Fax: [phone]  Website:  [email or web address]  [name] [name] [name] [name] 07/19/2020, 1:24 PM

## 2020-07-19 NOTE — Patient Instructions (Signed)
Medication Instructions:  Dr. Debara Pickett has prescribed Nexletol 180mg  once daily  *If you need a refill on your cardiac medications before your next appointment, please call your pharmacy*   Lab Work: TSH, Free T4, Vit D, LP(a) TODAY  FASTING lipid panel in 3 months  If you have labs (blood work) drawn today and your tests are completely normal, you will receive your results only by:  [place] (if you have MyChart) OR  A paper copy in the mail If you have any lab test that is abnormal or we need to change your treatment, we will call you to review the results.   Testing/Procedures: NONE   Follow-Up: At [facility], you and your health needs are our priority.  As part of our continuing mission to provide you with exceptional heart care, we have created designated Provider Care Teams.  These Care Teams include your primary Cardiologist (physician) and Advanced Practice Providers (APPs -  Physician Assistants and Nurse Practitioners) who all work together to provide you with the care you need, when you need it.  We recommend signing up for the patient portal called "MyChart".  Sign up information is provided on this After Visit Summary.  MyChart is used to connect with patients for Virtual Visits (Telemedicine).  Patients are able to view lab/test results, encounter notes, upcoming appointments, etc.  Non-urgent messages can be sent to your provider as well.   To learn more about what you can do with MyChart, go to [email or web address].    Your next appointment:   3 month(s)  The format for your next appointment:   In Person  Provider:   K. Mali Hilty, MD   Other Instructions

## 2020-07-20 LAB — T4, FREE: Free T4: 1.03 ng/dL (ref 0.82–1.77)

## 2020-07-20 LAB — VITAMIN D 25 HYDROXY (VIT D DEFICIENCY, FRACTURES): Vit D, 25-Hydroxy: 11.7 ng/mL — ABNORMAL LOW (ref 30.0–100.0)

## 2020-07-20 LAB — LIPOPROTEIN A (LPA): Lipoprotein (a): 22.2 nmol/L (ref <75.0)

## 2020-07-20 LAB — TSH: TSH: 0.903 u[IU]/mL (ref 0.450–4.500)

## 2020-07-23 ENCOUNTER — Telehealth: Payer: Self-pay | Admitting: Internal Medicine

## 2020-07-23 NOTE — Telephone Encounter (Signed)
PA for nexletol submitted via CMM (Key: [id number])

## 2020-07-23 NOTE — Telephone Encounter (Signed)
Request Reference Number: [id number]. NEXLETOL TAB 180MG  is approved through 08/17/2021

## 2020-07-24 ENCOUNTER — Telehealth: Payer: Self-pay | Admitting: Internal Medicine

## 2020-07-24 DIAGNOSIS — E559 Vitamin D deficiency, unspecified: Secondary | ICD-10-CM

## 2020-07-24 MED ORDER — VITAMIN D (ERGOCALCIFEROL) 1.25 MG (50000 UNIT) PO CAPS: 50000.0000 [IU] | ORAL_CAPSULE | ORAL | 0 refills | Status: DC

## 2020-07-24 MED ORDER — VITAMIN D 50 MCG (2000 UT) PO TABS: 2000.0000 [IU] | ORAL_TABLET | Freq: Every day | ORAL | 3 refills | Status: DC

## 2020-07-24 NOTE — Telephone Encounter (Signed)
Labs that I checked in the lipid clinic. Thyroid is normal. LP(a) is negative. Vit D is very low - this can negatively affect the lipid profile. Recommend 50,000 IU Vit D2 (ergocalciferol) weekly for 4 weeks, then 2000 units daily- repeat Vit D level in 3 months with lipids.  Dr. Luciano Cutter with pt, aware of her lab results. New script sent to the pharmacy. She reports she has the paperwork for repeat lab work.

## 2020-07-24 NOTE — Telephone Encounter (Signed)
Pt called In returning Jenna's call about there lab results

## 2020-07-25 NOTE — Addendum Note (Signed)
Addended by: Fidel Levy on: 07/25/2020 07:54 AM   Modules accepted: Orders

## 2020-07-25 NOTE — Telephone Encounter (Signed)
Vitamin D level ordered. Will mail lab slip reminder before appointment

## 2020-07-27 ENCOUNTER — Other Ambulatory Visit: Payer: Self-pay | Admitting: Internal Medicine

## 2020-08-21 ENCOUNTER — Other Ambulatory Visit: Payer: Self-pay | Admitting: Internal Medicine

## 2020-09-06 ENCOUNTER — Other Ambulatory Visit: Payer: Self-pay | Admitting: Family Medicine

## 2020-09-06 NOTE — Telephone Encounter (Signed)
Last OV 03/16/20 Last refill 02/02/20 #90/1 Next OV not scheduled

## 2020-09-10 ENCOUNTER — Other Ambulatory Visit: Payer: Self-pay | Admitting: Cardiology

## 2020-10-19 ENCOUNTER — Ambulatory Visit: Payer: Medicare Other | Admitting: Cardiology

## 2020-11-01 DIAGNOSIS — E781 Pure hyperglyceridemia: Secondary | ICD-10-CM | POA: Diagnosis not present

## 2020-11-01 DIAGNOSIS — I214 Non-ST elevation (NSTEMI) myocardial infarction: Secondary | ICD-10-CM | POA: Diagnosis not present

## 2020-11-01 DIAGNOSIS — E559 Vitamin D deficiency, unspecified: Secondary | ICD-10-CM | POA: Diagnosis not present

## 2020-11-01 DIAGNOSIS — E785 Hyperlipidemia, unspecified: Secondary | ICD-10-CM | POA: Diagnosis not present

## 2020-11-01 LAB — LIPID PANEL
Chol/HDL Ratio: 13 ratio — ABNORMAL HIGH (ref 0.0–4.4)
Cholesterol, Total: 339 mg/dL — ABNORMAL HIGH (ref 100–199)
HDL: 26 mg/dL — ABNORMAL LOW (ref >39)
LDL Chol Calc (NIH): 165 mg/dL — ABNORMAL HIGH (ref 0–99)
Triglycerides: 715 mg/dL (ref 0–149)
VLDL Cholesterol Cal: 148 mg/dL — ABNORMAL HIGH (ref 5–40)

## 2020-11-02 LAB — VITAMIN D 25 HYDROXY (VIT D DEFICIENCY, FRACTURES): Vit D, 25-Hydroxy: 24.8 ng/mL — ABNORMAL LOW (ref 30.0–100.0)

## 2020-11-08 ENCOUNTER — Other Ambulatory Visit: Payer: Self-pay

## 2020-11-08 ENCOUNTER — Encounter: Payer: Self-pay | Admitting: Internal Medicine

## 2020-11-08 ENCOUNTER — Ambulatory Visit: Payer: Medicare Other | Admitting: Internal Medicine

## 2020-11-08 VITALS — BP 150/78 | HR 91 | Ht 64.5 in | Wt 164.4 lb

## 2020-11-08 DIAGNOSIS — M791 Myalgia, unspecified site: Secondary | ICD-10-CM | POA: Diagnosis not present

## 2020-11-08 DIAGNOSIS — I2510 Atherosclerotic heart disease of native coronary artery without angina pectoris: Secondary | ICD-10-CM | POA: Diagnosis not present

## 2020-11-08 DIAGNOSIS — Z9861 Coronary angioplasty status: Secondary | ICD-10-CM

## 2020-11-08 DIAGNOSIS — E7849 Other hyperlipidemia: Secondary | ICD-10-CM

## 2020-11-08 DIAGNOSIS — T466X5A Adverse effect of antihyperlipidemic and antiarteriosclerotic drugs, initial encounter: Secondary | ICD-10-CM | POA: Diagnosis not present

## 2020-11-08 DIAGNOSIS — E559 Vitamin D deficiency, unspecified: Secondary | ICD-10-CM

## 2020-11-08 MED ORDER — FENOFIBRATE 145 MG PO TABS: 145.0000 mg | ORAL_TABLET | Freq: Every day | ORAL | 11 refills | Status: DC

## 2020-11-08 NOTE — Patient Instructions (Addendum)
Medication Instructions:  INCREASE vitamin D to 3000 units daily   START fenofibrate 145mg  daily  *If you need a refill on your cardiac medications before your next appointment, please call your pharmacy*   Lab Work: FASTING lab work in 3-4 months to check cholesterol & vitamin D level  If you have labs (blood work) drawn today and your tests are completely normal, you will receive your results only by: Marland Kitchen MyChart Message (if you have MyChart) OR . A paper copy in the mail If you have any lab test that is abnormal or we need to change your treatment, we will call you to review the results.   Testing/Procedures: NONE   Follow-Up: At [facility], you and your health needs are our priority.  As part of our continuing mission to provide you with exceptional heart care, we have created designated Provider Care Teams.  These Care Teams include your primary Cardiologist (physician) and Advanced Practice Providers (APPs -  Physician Assistants and Nurse Practitioners) who all work together to provide you with the care you need, when you need it.  We recommend signing up for the patient portal called "MyChart".  Sign up information is provided on this After Visit Summary.  MyChart is used to connect with patients for Virtual Visits (Telemedicine).  Patients are able to view lab/test results, encounter notes, upcoming appointments, etc.  Non-urgent messages can be sent to your provider as well.   To learn more about what you can do with MyChart, go to [email or web address].    Your next appointment:   3-4 month(s) - lipid clinic  The format for your next appointment:   In Person or Virtual  Provider:   K. Mali Hilty, MD   Other Instructions

## 2020-11-08 NOTE — Progress Notes (Signed)
LIPID CLINIC CONSULT NOTE  Chief Complaint:  Follow-up dyslipidemia  Primary Care Physician: Billie Ruddy, MD  Primary Cardiologist:  Glenetta Hew, MD  HPI:  [name] [name] is a 65 y.o. female who is being seen today for the evaluation of dyslipidemia at the request of Glenetta Hew, MD. this is a 65 year old female patient of Dr. Ellyn Hack with a history of coronary artery disease, DVT and multiple emboli, lupus anticoagulant syndrome and long-term anticoagulation on Xarelto.  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  She also has numerous medication intolerance.  She has been seen previously by our pharmacist in the lipid disorders clinic, noting intolerance to atorvastatin 40 mg, rosuvastatin 40 mg, rosuvastatin 10 mg, PET-avid statin 4 mg, fenofibrate 90 mg, and fenofibrate 145 mg daily.  Her most recent lipid profile showed total cholesterol 373, triglycerides 770, HDL 27 and LDL 179.  She is also tried both Repatha and Praluent having side effects as well including myalgias and generalized fatigue.  11/08/2020  [name] [name] returns today for lipid follow-up.  Her cholesterol remains high.  Total cholesterol 339, triglycerides 715, HDL 26 and LDL 165.  She was found to have a very low vitamin D level 11.7 and I repleted that.  She is now up to 24.8 but still lower than recommended.  She is currently taking 2000 units daily.  She mentioned to me almost immediately today that one of her friends takes fenofibrate and have recommended that we try that for her triglycerides.  I noted that she had been previously on both fenofibrate 90 and 145 mg daily and said she had intolerance to that.  She does not recall it and says she wants to try it again.  I also recommended that she should consider Vascepa.  She currently takes over-the-counter fish oil  however we discussed the other benefits of this.  She was concerned about cost.  Finally, his LDL remains the main issue, we discussed alternatives to PCSK9 inhibitors such as inclisiran.  She also might be a candidate for one of the new CETP inhibitors.  PMHx:  Past Medical History:  Diagnosis Date  . Back pain   . CAD S/P percutaneous coronary angioplasty 09/13/2018   09/13/2018 - Cath & Staged DES PCI on 09/14/2018: DES PCI: Cx99%&55% - Resolute Onyx DES 3.5 x 30 (3.6 mm), p-mLAD (prox of D1 almost to D2) - Resolute Onyx DES 3.0 x 22 (3.3 mm); DFR on pRCA 70% - 0.99, Not significant -> Rec Med Rx.  . DVT (deep venous thrombosis) ([place])   . Heart attack ([place]) 09/09/2018  . Hypertension   . Lupus anticoagulant syndrome ([place])   . NSTEMI (non-ST elevated myocardial infarction) ([place]) 09/12/2018   Initial presentation with chest pain was on 09/09/2018, recurrent pain on 1/26 2020 with positive troponins.  Cath showed three-vessel CAD -> declined CABG, opted for two-vessel DES PCI (LAD and CX, negative DFR RCA)  . Pulmonary embolism [facility])     Past Surgical History:  Procedure Laterality Date  . BACK SURGERY    . COLONOSCOPY  05/19/2012   Procedure: COLONOSCOPY;  Surgeon: Beryle Beams, MD;  Location: [place];  Service: Endoscopy;  Laterality: N/A;  . COLONOSCOPY WITH PROPOFOL N/A 09/30/2018   Procedure: COLONOSCOPY WITH PROPOFOL;  Surgeon: Yetta Flock, MD;  Location: [place];  Service: Gastroenterology;  Laterality: N/A;  . CORONARY  STENT INTERVENTION N/A 09/14/2018   Procedure: CORONARY STENT INTERVENTION;  Surgeon: Martinique, Peter M, MD;  Location: [place] CV LAB;  Service: Cardiovascular: September 14, 2018- DES PCI: Cx99%&55% - Resolute Onyx DES 3.5 x 30 (3.6 mm), p-mLAD (prox of D1 almost to D2) - Resolute Onyx DES 3.0 x 22 (3.3 mm); DFR on pRCA 70% - 0.99, Not significant -> Rec Med Rx.  . ESOPHAGOGASTRODUODENOSCOPY  05/19/2012   Procedure: ESOPHAGOGASTRODUODENOSCOPY (EGD);   Surgeon: Beryle Beams, MD;  Location: [facility] ENDOSCOPY;  Service: Endoscopy;  Laterality: N/A;  . ESOPHAGOGASTRODUODENOSCOPY (EGD) WITH PROPOFOL N/A 09/30/2018   Procedure: ESOPHAGOGASTRODUODENOSCOPY (EGD) WITH PROPOFOL;  Surgeon: Yetta Flock, MD;  Location: [place];  Service: Gastroenterology;  Laterality: N/A;  . GIVENS CAPSULE STUDY N/A 09/30/2018   Procedure: GIVENS CAPSULE STUDY;  Surgeon: Yetta Flock, MD;  Location: [place];  Service: Gastroenterology;  Laterality: N/A;  . HOT HEMOSTASIS N/A 09/30/2018   Procedure: HOT HEMOSTASIS (ARGON PLASMA COAGULATION/BICAP);  Surgeon: Yetta Flock, MD;  Location: [facility] ENDOSCOPY;  Service: Gastroenterology;  Laterality: N/A;  . INTRAVASCULAR PRESSURE WIRE/FFR STUDY N/A 09/14/2018   Procedure: INTRAVASCULAR PRESSURE WIRE/FFR STUDY;  Surgeon: Martinique, Peter M, MD;  Location: [place] CV LAB;  Service: Cardiovascular;  Laterality: RCA: DFR on pRCA 70% - 0.99, Not significant -> Rec Med Rx.  Marland Kitchen LEFT HEART CATH AND CORONARY ANGIOGRAPHY N/A 09/13/2018   Procedure: LEFT HEART CATH AND CORONARY ANGIOGRAPHY;  Surgeon: Leonie Man, MD;  Location: [place] CV LAB;  Service: Cardiovascular:: CULPRIT LESION 99% p-mCx followed by 55%mCx; pLAD 35% A D66fllowed by long 80% lesion @ SP1); pRCA 70%. Normal LVEDP. Global HK EF ~45%.  -Decision on PCI deferred until discussion about PCI versus CABG.  Concern because of long-term DOAC.  .Marland KitchenTRANSTHORACIC ECHOCARDIOGRAM  09/13/2018   Mildly reduced EF 45 to 50% with diffuse HK.  GR 1 DD.  Mild aortic valve calcification.  MAC.  Moderate pulmonic regurgitation.    FAMHx:  Family History  Problem Relation Age of Onset  . Leukemia Mother   . Prostate cancer Father   . Cancer Father   . Stroke Maternal Grandmother   . Lung cancer Maternal Grandfather   . Leukemia Paternal Grandmother   . Cancer Paternal Grandfather   . Spina bifida Son        Multiple surgeries  . Colon cancer Neg Hx   .  Esophageal cancer Neg Hx     SOCHx:   reports that she has been smoking cigarettes. She has a 14.50 pack-year smoking history. She has never used smokeless tobacco. She reports current alcohol use. She reports that she does not use drugs.  ALLERGIES:  Allergies  Allergen Reactions  . Prednisone Other (See Comments)    No energy "stalls out".    Makes pt  Jittery.  . Tramadol Other (See Comments)    Constipation,  Makes pt  Jittery.  . Crestor [Rosuvastatin Calcium]     Feels fatigued, RE  CHALLENGE - UNABLE TO USE 12/28/18  . Hctz [Hydrochlorothiazide]     Dizzy   . Repatha [Evolocumab]     ROS: Pertinent items noted in HPI and remainder of comprehensive ROS otherwise negative.  HOME MEDS: Current Outpatient Medications on File Prior to Visit  Medication Sig Dispense Refill  . Acetaminophen (ACETAMIN PO) Take by mouth.    .Marland KitchenamLODipine (NORVASC) 5 MG tablet TAKE 1 TABLET BY MOUTH TWICE A DAY 180 tablet 1  . carvedilol (COREG)  6.25 MG tablet Take 0.5 tablets (3.125 mg total) by mouth 2 (two) times daily with a meal. 90 tablet 3  . Cholecalciferol (VITAMIN D) 50 MCG (2000 UT) tablet Take 1 tablet (2,000 Units total) by mouth daily. 90 tablet 3  . Coenzyme Q10 (COQ10) 100 MG CAPS Take 300 capsules by mouth daily. 90 capsule 11  . lisinopril (ZESTRIL) 40 MG tablet TAKE 1 TABLET BY MOUTH EVERY DAY 90 tablet 0  . nitroGLYCERIN (NITROSTAT) 0.4 MG SL tablet Place 1 tablet (0.4 mg total) under the tongue every 5 (five) minutes as needed for up to 3 doses for chest pain. 25 tablet 5  . Omega-3 Fatty Acids (FISH OIL) 1000 MG CPDR Take 3,000 capsules by mouth daily. 90 capsule 11  . Vitamin D, Ergocalciferol, (DRISDOL) 1.25 MG (50000 UNIT) CAPS capsule Take 1 capsule (50,000 Units total) by mouth every 7 (seven) days. 5 capsule 0  . XARELTO 20 MG TABS tablet TAKE 1 TABLET (20 MG TOTAL) BY MOUTH DAILY WITH SUPPER. 90 tablet 3   No current facility-administered medications on file prior to  visit.    LABS/IMAGING: No results found for this or any previous visit (from the past 48 hour(s)). No results found.  LIPID PANEL:    Component Value Date/Time   CHOL 339 (H) 11/01/2020 1114   TRIG 715 (HH) 11/01/2020 1114   TRIG 681 (HH) 10/27/2014 1155   HDL 26 (L) 11/01/2020 1114   HDL 29 (L) 10/27/2014 1155   CHOLHDL 13.0 (H) 11/01/2020 1114   CHOLHDL 8 11/25/2018 1008   VLDL 74 (H) 09/13/2018 0144   LDLCALC 165 (H) 11/01/2020 1114   LDLCALC NOTES: 08/25/2013 1612   LDLDIRECT 141.0 11/25/2018 1008    WEIGHTS: Wt Readings from Last 3 Encounters:  11/08/20 164 lb 6.4 oz (74.6 kg)  07/19/20 166 lb (75.3 kg)  04/13/20 160 lb (72.6 kg)    VITALS: BP (!) 150/78   Pulse 91   Ht 5' 4.5" (1.638 m)   Wt 164 lb 6.4 oz (74.6 kg)   SpO2 99%   BMI 27.78 kg/m   EXAM: Deferred  EKG: Deferred  ASSESSMENT: 1. 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 Multistatin and PCSK9 inhibitor intolerance-myalgias, fatigue, etc. 3. History of coronary artery disease with PCI 4. History of DVTs 5. Lupus anticoagulant syndrome  PLAN: 1.   Ms. Dizdarevic wishes to start with fenofibrate again although she was listed as having side effects with that in the past.  We will need to monitor this closely.  I have highly recommended switching her from over-the-counter fish oil to Vascepa.  We should also consider an alternative to lower LDL as neither 1 of these are good options for that.  I do think that is driving her heart disease even more significantly.  For now though she wants to trial just the fenofibrate.  Follow-up in 3 to 4 months with repeat lipids.  Thanks for the kind referral.  Pixie Casino, MD, FACC, [place] Director of the Advanced Lipid Disorders &  Cardiovascular Risk Reduction Clinic Diplomate of the American Board of Clinical Lipidology Attending Cardiologist  Direct Dial: [phone]  Fax: [phone]  Website:   [email or web address] 11/08/2020, 10:23 AM

## 2020-11-29 ENCOUNTER — Other Ambulatory Visit: Payer: Self-pay | Admitting: Family Medicine

## 2020-12-06 ENCOUNTER — Telehealth: Payer: Self-pay | Admitting: Family Medicine

## 2020-12-06 NOTE — Telephone Encounter (Signed)
FYI  Mavis from [employer] called to let Dr. Volanda Napoleon know that they did a PAD test today and the patients Left foot is 0.51 and her Right foot is 0.71  [employer] Call [phone]

## 2020-12-13 ENCOUNTER — Other Ambulatory Visit: Payer: Self-pay | Admitting: Cardiology

## 2020-12-19 NOTE — Telephone Encounter (Signed)
Can place a referral for patient to see Vascular for peripheral artery disease.

## 2020-12-20 NOTE — Telephone Encounter (Signed)
[name], no answer, left vm to call office.

## 2020-12-26 ENCOUNTER — Other Ambulatory Visit: Payer: Self-pay

## 2020-12-26 DIAGNOSIS — Z86718 Personal history of other venous thrombosis and embolism: Secondary | ICD-10-CM

## 2020-12-26 DIAGNOSIS — I10 Essential (primary) hypertension: Secondary | ICD-10-CM

## 2020-12-26 NOTE — Telephone Encounter (Signed)
Referral placed.

## 2020-12-27 ENCOUNTER — Ambulatory Visit: Payer: Medicare Other | Admitting: Cardiology

## 2021-01-02 ENCOUNTER — Telehealth: Payer: Self-pay | Admitting: Family Medicine

## 2021-01-02 NOTE — Telephone Encounter (Signed)
Pt called back and said she did not need to see a VASCULAR SURGERY. She declined the referral. If the nurse must speak with her, she may call her  [phone] cell or work [phone] until 5 pm.

## 2021-01-18 ENCOUNTER — Other Ambulatory Visit: Payer: Self-pay | Admitting: Family Medicine

## 2021-02-21 DIAGNOSIS — Z23 Encounter for immunization: Secondary | ICD-10-CM | POA: Diagnosis not present

## 2021-05-02 DIAGNOSIS — E559 Vitamin D deficiency, unspecified: Secondary | ICD-10-CM | POA: Diagnosis not present

## 2021-05-02 DIAGNOSIS — E7849 Other hyperlipidemia: Secondary | ICD-10-CM | POA: Diagnosis not present

## 2021-05-03 ENCOUNTER — Other Ambulatory Visit: Payer: Self-pay

## 2021-05-03 ENCOUNTER — Encounter: Payer: Self-pay | Admitting: Internal Medicine

## 2021-05-03 ENCOUNTER — Ambulatory Visit: Payer: Medicare Other | Admitting: Internal Medicine

## 2021-05-03 VITALS — BP 156/84 | HR 74 | Ht 64.5 in | Wt 151.0 lb

## 2021-05-03 DIAGNOSIS — I2510 Atherosclerotic heart disease of native coronary artery without angina pectoris: Secondary | ICD-10-CM

## 2021-05-03 DIAGNOSIS — Z9861 Coronary angioplasty status: Secondary | ICD-10-CM | POA: Diagnosis not present

## 2021-05-03 DIAGNOSIS — T466X5D Adverse effect of antihyperlipidemic and antiarteriosclerotic drugs, subsequent encounter: Secondary | ICD-10-CM

## 2021-05-03 DIAGNOSIS — E781 Pure hyperglyceridemia: Secondary | ICD-10-CM | POA: Diagnosis not present

## 2021-05-03 DIAGNOSIS — E7849 Other hyperlipidemia: Secondary | ICD-10-CM | POA: Diagnosis not present

## 2021-05-03 DIAGNOSIS — M791 Myalgia, unspecified site: Secondary | ICD-10-CM

## 2021-05-03 DIAGNOSIS — T466X5A Adverse effect of antihyperlipidemic and antiarteriosclerotic drugs, initial encounter: Secondary | ICD-10-CM

## 2021-05-03 LAB — LIPID PANEL
Chol/HDL Ratio: 8.2 ratio — ABNORMAL HIGH (ref 0.0–4.4)
Cholesterol, Total: 269 mg/dL — ABNORMAL HIGH (ref 100–199)
HDL: 33 mg/dL — ABNORMAL LOW (ref >39)
LDL Chol Calc (NIH): 170 mg/dL — ABNORMAL HIGH (ref 0–99)
Triglycerides: 343 mg/dL — ABNORMAL HIGH (ref 0–149)
VLDL Cholesterol Cal: 66 mg/dL — ABNORMAL HIGH (ref 5–40)

## 2021-05-03 LAB — VITAMIN D 25 HYDROXY (VIT D DEFICIENCY, FRACTURES): Vit D, 25-Hydroxy: 36.1 ng/mL (ref 30.0–100.0)

## 2021-05-03 NOTE — Patient Instructions (Signed)
Medication Instructions:  Dr. Debara Pickett has recommended Leqvio. We will have to work on a benefits investigation with insurance and may have to do a prior authorization. The process may take a few weeks - month(s).   Your Leqvio injection is scheduled on ________ at _________. Please arrive at [facility] and enter the hospital through [place] (the [employer]) that's located at [employer] 15 minutes before your scheduled injection time. Walk in to the registration desk and let them know that you are scheduled for an injection in the infusion center. They will register you and take you to your appointment.   Marion Downer is a subcutaneous injection that will lower your LDL cholesterol by 50%. If this is your first injection, your next injection will be due in 3 months. If this is NOT your first injection, your next injection will be due in 6 months. If you have not heard from [facility] to schedule your next appointment within 2 weeks of your next anticipated injection, please call [facility] to schedule this at:                [phone] - if you are seen at the [facility] office               [phone] - if you are seen at the [facility] office  *If you need a refill on your cardiac medications before your next appointment, please call your pharmacy*   Lab Work: FASTING lab work to check cholesterol about 4-5 months after 1st Leqvio dose  If you have labs (blood work) drawn today and your tests are completely normal, you will receive your results only by: [place] (if you have MyChart) OR A paper copy in the mail If you have any lab test that is abnormal or we need to change your treatment, we will call you to review the results.  Follow-Up: At [facility], you and your health needs are our priority.  As part of our continuing mission to provide you with exceptional heart care, we have created designated Provider Care Teams.  These Care  Teams include your primary Cardiologist (physician) and Advanced Practice Providers (APPs -  Physician Assistants and Nurse Practitioners) who all work together to provide you with the care you need, when you need it.  We recommend signing up for the patient portal called "MyChart".  Sign up information is provided on this After Visit Summary.  MyChart is used to connect with patients for Virtual Visits (Telemedicine).  Patients are able to view lab/test results, encounter notes, upcoming appointments, etc.  Non-urgent messages can be sent to your provider as well.   To learn more about what you can do with MyChart, go to [email or web address].    Your next appointment:   6 month(s) - lipid clinic  The format for your next appointment:   In Person  Provider:   K. Mali Hilty, MD   Other Instructions

## 2021-05-03 NOTE — Progress Notes (Signed)
LIPID CLINIC CONSULT NOTE  Chief Complaint:  Follow-up dyslipidemia  Primary Care Physician: Billie Ruddy, MD  Primary Cardiologist:  Glenetta Hew, MD  HPI:  [name] [name] is a 65 y.o. female who is being seen today for the evaluation of dyslipidemia at the request of Glenetta Hew, MD. this is a 65 year old female patient of Dr. Ellyn Hack with a history of coronary artery disease, DVT and multiple emboli, lupus anticoagulant syndrome and long-term anticoagulation on Xarelto.  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  She also has numerous medication intolerance.  She has been seen previously by our pharmacist in the lipid disorders clinic, noting intolerance to atorvastatin 40 mg, rosuvastatin 40 mg, rosuvastatin 10 mg, PET-avid statin 4 mg, fenofibrate 90 mg, and fenofibrate 145 mg daily.  Her most recent lipid profile showed total cholesterol 373, triglycerides 770, HDL 27 and LDL 179.  She is also tried both Repatha and Praluent having side effects as well including myalgias and generalized fatigue.  11/08/2020  [name] [name] returns today for lipid follow-up.  Her cholesterol remains high.  Total cholesterol 339, triglycerides 715, HDL 26 and LDL 165.  She was found to have a very low vitamin D level 11.7 and I repleted that.  She is now up to 24.8 but still lower than recommended.  She is currently taking 2000 units daily.  She mentioned to me almost immediately today that one of her friends takes fenofibrate and have recommended that we try that for her triglycerides.  I noted that she had been previously on both fenofibrate 90 and 145 mg daily and said she had intolerance to that.  She does not recall it and says she wants to try it again.  I also recommended that she should consider Vascepa.  She currently takes over-the-counter fish oil  however we discussed the other benefits of this.  She was concerned about cost.  Finally, his LDL remains the main issue, we discussed alternatives to PCSK9 inhibitors such as inclisiran.  She also might be a candidate for one of the new CETP inhibitors.  05/03/2021  [name] [name] returns today for follow-up.  Overall she has had a marked improvement in her triglycerides.  Total cholesterol now 269 with triglycerides 343 (down from 715), HDL 33 and LDL persistently elevated at 170.  This is consistent with a mixed pattern possibly familial combined hyperlipidemia.  She is only on fenofibrate and taking fish oil.  We discussed Vascepa because of cardiovascular risk reduction however she has been hesitant to take that due to cost issues.  She is also on Xarelto which she says could be very expensive.  She plans to talk to her primary cardiologist Dr. Ellyn Hack about this next week.  She unfortunately cannot take any statins and had side effects with Repatha.  We discussed the possibility of using Leqvio which has been recently FDA approved.  By the fact that this has very few of any side effects I think it could be an option for her, however cost may still be an issue.  She is now on Medicare and it is more of a preferred coverage for this so we will investigate what her coverage and co-pay may be.   PMHx:  Past Medical History:  Diagnosis Date   Back pain    CAD S/P percutaneous coronary angioplasty 09/13/2018   09/13/2018 - Cath & Staged  DES PCI on 09/14/2018: DES PCI: Cx99%&55% - Resolute Onyx DES 3.5 x 30 (3.6 mm), p-mLAD (prox of D1 almost to D2) - Resolute Onyx DES 3.0 x 22 (3.3 mm); DFR on pRCA 70% - 0.99, Not significant -> Rec Med Rx.   DVT (deep venous thrombosis) ([place])    Heart attack ([place]) 09/09/2018   Hypertension    Lupus anticoagulant syndrome ([facility])    NSTEMI (non-ST elevated myocardial infarction) ([place]) 09/12/2018   Initial presentation with chest pain was on 09/09/2018, recurrent pain on  1/26 2020 with positive troponins.  Cath showed three-vessel CAD -> declined CABG, opted for two-vessel DES PCI (LAD and CX, negative DFR RCA)   Pulmonary embolism ([place])     Past Surgical History:  Procedure Laterality Date   BACK SURGERY     COLONOSCOPY  05/19/2012   Procedure: COLONOSCOPY;  Surgeon: Beryle Beams, MD;  Location: [place];  Service: Endoscopy;  Laterality: N/A;   COLONOSCOPY WITH PROPOFOL N/A 09/30/2018   Procedure: COLONOSCOPY WITH PROPOFOL;  Surgeon: Yetta Flock, MD;  Location: [place];  Service: Gastroenterology;  Laterality: N/A;   CORONARY STENT INTERVENTION N/A 09/14/2018   Procedure: CORONARY STENT INTERVENTION;  Surgeon: Martinique, Peter M, MD;  Location: [place] [facility];  Service: Cardiovascular: September 14, 2018- DES PCI: Cx99%&55% - Resolute Onyx DES 3.5 x 30 (3.6 mm), p-mLAD (prox of D1 almost to D2) - Resolute Onyx DES 3.0 x 22 (3.3 mm); DFR on pRCA 70% - 0.99, Not significant -> Rec Med Rx.   ESOPHAGOGASTRODUODENOSCOPY  05/19/2012   Procedure: ESOPHAGOGASTRODUODENOSCOPY (EGD);  Surgeon: Beryle Beams, MD;  Location: [facility];  Service: Endoscopy;  Laterality: N/A;   ESOPHAGOGASTRODUODENOSCOPY (EGD) WITH PROPOFOL N/A 09/30/2018   Procedure: ESOPHAGOGASTRODUODENOSCOPY (EGD) WITH PROPOFOL;  Surgeon: Yetta Flock, MD;  Location: [place];  Service: Gastroenterology;  Laterality: N/A;   GIVENS CAPSULE STUDY N/A 09/30/2018   Procedure: GIVENS CAPSULE STUDY;  Surgeon: Yetta Flock, MD;  Location: [place];  Service: Gastroenterology;  Laterality: N/A;   HOT HEMOSTASIS N/A 09/30/2018   Procedure: HOT HEMOSTASIS (ARGON PLASMA COAGULATION/BICAP);  Surgeon: Yetta Flock, MD;  Location: [facility] ENDOSCOPY;  Service: Gastroenterology;  Laterality: N/A;   INTRAVASCULAR PRESSURE WIRE/FFR STUDY N/A 09/14/2018   Procedure: INTRAVASCULAR PRESSURE WIRE/FFR STUDY;  Surgeon: Martinique, Peter M, MD;  Location: [place] [facility];  Service:  Cardiovascular;  Laterality: RCA: DFR on pRCA 70% - 0.99, Not significant -> Rec Med Rx.   LEFT HEART CATH AND CORONARY ANGIOGRAPHY N/A 09/13/2018   Procedure: LEFT HEART CATH AND CORONARY ANGIOGRAPHY;  Surgeon: Leonie Man, MD;  Location: [place] [facility];  Service: Cardiovascular:: CULPRIT LESION 99% p-mCx followed by 55%mCx; pLAD 35% A D33fllowed by long 80% lesion @ SP1); pRCA 70%. Normal LVEDP. Global HK EF ~45%.  -Decision on PCI deferred until discussion about PCI versus CABG.  Concern because of long-term DOAC.   TRANSTHORACIC ECHOCARDIOGRAM  09/13/2018   Mildly reduced EF 45 to 50% with diffuse HK.  GR 1 DD.  Mild aortic valve calcification.  MAC.  Moderate pulmonic regurgitation.    FAMHx:  Family History  Problem Relation Age of Onset   Leukemia Mother    Prostate cancer Father    Cancer Father    Stroke Maternal Grandmother    Lung cancer Maternal Grandfather    Leukemia Paternal Grandmother    Cancer Paternal Grandfather    Spina bifida Son        Multiple  surgeries   Colon cancer Neg Hx    Esophageal cancer Neg Hx     SOCHx:   reports that she has been smoking cigarettes. She has a 14.50 pack-year smoking history. She has never used smokeless tobacco. She reports current alcohol use. She reports that she does not use drugs.  ALLERGIES:  Allergies  Allergen Reactions   Prednisone Other (See Comments)    No energy "stalls out".    Makes pt  Jittery.   Tramadol Other (See Comments)    Constipation,  Makes pt  Jittery.   Crestor [Rosuvastatin Calcium]     Feels fatigued, RE  CHALLENGE - UNABLE TO USE 12/28/18   Hctz [Hydrochlorothiazide]     Dizzy    Repatha [Evolocumab]     ROS: Pertinent items noted in HPI and remainder of comprehensive ROS otherwise negative.  HOME MEDS: Current Outpatient Medications on File Prior to Visit  Medication Sig Dispense Refill   Acetaminophen (ACETAMIN PO) Take by mouth.     amLODipine (NORVASC) 5 MG tablet TAKE 1 TABLET  BY MOUTH TWICE A DAY 180 tablet 1   carvedilol (COREG) 6.25 MG tablet TAKE 0.5 TABLETS (3.125 MG TOTAL) BY MOUTH 2 (TWO) TIMES DAILY WITH A MEAL. 90 tablet 1   Coenzyme Q10 (COQ10) 100 MG CAPS Take 300 capsules by mouth daily. 90 capsule 11   fenofibrate (TRICOR) 145 MG tablet Take 1 tablet (145 mg total) by mouth daily. Take with a meal. 30 tablet 11   lisinopril (ZESTRIL) 40 MG tablet TAKE 1 TABLET BY MOUTH EVERY DAY 90 tablet 0   nitroGLYCERIN (NITROSTAT) 0.4 MG SL tablet Place 1 tablet (0.4 mg total) under the tongue every 5 (five) minutes as needed for up to 3 doses for chest pain. 25 tablet 5   Omega-3 Fatty Acids (FISH OIL) 1000 MG CPDR Take 3,000 capsules by mouth daily. 90 capsule 11   Vitamin D, Cholecalciferol, 25 MCG (1000 UT) CAPS Take 3,000 Units by mouth daily.     XARELTO 20 MG TABS tablet TAKE 1 TABLET (20 MG TOTAL) BY MOUTH DAILY WITH SUPPER. 90 tablet 3   No current facility-administered medications on file prior to visit.    LABS/IMAGING: Results for orders placed or performed in visit on 11/08/20 (from the past 48 hour(s))  Lipid panel     Status: Abnormal   Collection Time: 05/02/21  9:51 AM  Result Value Ref Range   Cholesterol, Total 269 (H) 100 - 199 mg/dL   Triglycerides 343 (H) 0 - 149 mg/dL   HDL 33 (L) >39 mg/dL   VLDL Cholesterol Cal 66 (H) 5 - 40 mg/dL   LDL Chol Calc (NIH) 170 (H) 0 - 99 mg/dL   Chol/HDL Ratio 8.2 (H) 0.0 - 4.4 ratio    Comment:                                   T. Chol/HDL Ratio                                             Men  Women                               1/2 Avg.Risk  3.4  3.3                                   Avg.Risk  5.0    4.4                                2X Avg.Risk  9.6    7.1                                3X Avg.Risk 23.4   11.0   Vitamin D (25 hydroxy)     Status: None   Collection Time: 05/02/21  9:51 AM  Result Value Ref Range   Vit D, 25-Hydroxy 36.1 30.0 - 100.0 ng/mL    Comment: Vitamin D deficiency has  been defined by the [place] practice guideline as a level of serum 25-OH vitamin D less than 20 ng/mL (1,2). The Endocrine Society went on to further define vitamin D insufficiency as a level between 21 and 29 ng/mL (2). 1. IOM (Institute of Medicine). 2010. Dietary reference    intakes for calcium and D. [place]: The    [employer]. 2. Holick MF, Binkley Hemingway, Bischoff-Ferrari HA, et al.    Evaluation, treatment, and prevention of vitamin D    deficiency: an Endocrine Society clinical practice    guideline. JCEM. 2011 Jul; 96(7):1911-30.    No results found.  LIPID PANEL:    Component Value Date/Time   CHOL 269 (H) 05/02/2021 0951   TRIG 343 (H) 05/02/2021 0951   TRIG 681 (HH) 10/27/2014 1155   HDL 33 (L) 05/02/2021 0951   HDL 29 (L) 10/27/2014 1155   CHOLHDL 8.2 (H) 05/02/2021 0951   CHOLHDL 8 11/25/2018 1008   VLDL 74 (H) 09/13/2018 0144   LDLCALC 170 (H) 05/02/2021 0951   LDLCALC NOTES: 08/25/2013 1612   LDLDIRECT 141.0 11/25/2018 1008    WEIGHTS: Wt Readings from Last 3 Encounters:  05/03/21 151 lb (68.5 kg)  11/08/20 164 lb 6.4 oz (74.6 kg)  07/19/20 166 lb (75.3 kg)    VITALS: BP (!) 156/84   Pulse 74   Ht 5' 4.5" (1.638 m)   Wt 151 lb (68.5 kg)   SpO2 99%   BMI 25.52 kg/m   EXAM: Deferred  EKG: Deferred  ASSESSMENT: 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 History of coronary artery disease with PCI History of DVTs Lupus anticoagulant syndrome  PLAN: 1.   [name] [name] has had about a 50% improvement in her triglycerides.  Fenofibrate, however is not associate with cardiovascular risk reduction to any extent and she has been hesitant to go onto Vascepa as well even though there is some cardiovascular risk reduction data.  LDL remains quite high.  She cannot tolerate statins or PCSK9 inhibitors therefore we might consider  Leqvio.  She said she was interested if it could be cost effective.  We will try to see if we can prescribe that for her and what her co-pay would be for that medication.  Plan follow-up with me in 6 months or sooner as necessary.  Pixie Casino, MD, [facility], [place] Director of the [facility] Lipid Disorders &  Cardiovascular Risk Reduction Clinic Diplomate of the American  Board of Clinical Lipidology Attending Cardiologist  Direct Dial: [phone]  Fax: [phone]  Website:  [email or web address] 05/03/2021, 10:41 AM

## 2021-05-10 ENCOUNTER — Encounter: Payer: Self-pay | Admitting: Cardiology

## 2021-05-10 ENCOUNTER — Ambulatory Visit: Payer: Medicare Other | Admitting: Cardiology

## 2021-05-10 ENCOUNTER — Other Ambulatory Visit: Payer: Self-pay

## 2021-05-10 VITALS — BP 150/90 | HR 87 | Ht 64.8 in | Wt 155.0 lb

## 2021-05-10 DIAGNOSIS — Z716 Tobacco abuse counseling: Secondary | ICD-10-CM | POA: Diagnosis not present

## 2021-05-10 DIAGNOSIS — T466X5A Adverse effect of antihyperlipidemic and antiarteriosclerotic drugs, initial encounter: Secondary | ICD-10-CM

## 2021-05-10 DIAGNOSIS — Z7902 Long term (current) use of antithrombotics/antiplatelets: Secondary | ICD-10-CM

## 2021-05-10 DIAGNOSIS — I2510 Atherosclerotic heart disease of native coronary artery without angina pectoris: Secondary | ICD-10-CM

## 2021-05-10 DIAGNOSIS — I214 Non-ST elevation (NSTEMI) myocardial infarction: Secondary | ICD-10-CM | POA: Diagnosis not present

## 2021-05-10 DIAGNOSIS — E785 Hyperlipidemia, unspecified: Secondary | ICD-10-CM

## 2021-05-10 DIAGNOSIS — F17210 Nicotine dependence, cigarettes, uncomplicated: Secondary | ICD-10-CM

## 2021-05-10 DIAGNOSIS — G720 Drug-induced myopathy: Secondary | ICD-10-CM

## 2021-05-10 DIAGNOSIS — I10 Essential (primary) hypertension: Secondary | ICD-10-CM | POA: Diagnosis not present

## 2021-05-10 DIAGNOSIS — Z9861 Coronary angioplasty status: Secondary | ICD-10-CM

## 2021-05-10 DIAGNOSIS — E7849 Other hyperlipidemia: Secondary | ICD-10-CM | POA: Diagnosis not present

## 2021-05-10 DIAGNOSIS — D6859 Other primary thrombophilia: Secondary | ICD-10-CM

## 2021-05-10 MED ORDER — RIVAROXABAN 20 MG PO TABS: 20.0000 mg | ORAL_TABLET | Freq: Every day | ORAL | 0 refills | Status: DC

## 2021-05-10 NOTE — Patient Instructions (Signed)

## 2021-05-10 NOTE — Progress Notes (Signed)
Primary Care Provider: Billie Ruddy, MD Cardiologist: [name] Hew, MD Electrophysiologist: None  Clinic Note: Chief Complaint  Patient presents with   Follow-up    Annual.  Doing well.  No major complaints.   Coronary Artery Disease    No angina     ===================================  ASSESSMENT/PLAN   Problem List Items Addressed This Visit       Cardiology Problems   Non-ST elevation (NSTEMI) myocardial infarction (HCC) (Chronic)    2 and half years out from her MI.  No further anginal pain.  RCA being treated medically as it was not FFR positive.  Stents in the LAD and circumflex seem to be doing well.  No CHF symptoms.      Relevant Medications   rivaroxaban (XARELTO) 20 MG TABS tablet   Familial hyperlipidemia (Chronic)    Following up with Dr. Debara [name].  Hopefully, we can get her started on inclisiran.  Really this is the only option we have for her.  She is on fenofibrate and fish oil along with co-Q10.  Recent labs were well out of control.  Continue to address diet and exercise.      Relevant Medications   rivaroxaban (XARELTO) 20 MG TABS tablet   Other Relevant Orders   EKG 12-Lead (Completed)   Essential hypertension (Chronic)    Blood pressure borderline elevated today, but if she forgot to take her medications this morning.  Currently on amlodipine 5 mg twice daily, carvedilol 3.125 mg twice daily along with lisinopril 40 mg daily.  Intolerant of diuretics.  Blood pressures at home seem to be better controlled than they are here.  Continue current meds.  We backed off her beta-blocker because of bradycardia and fatigue in the past, we could potentially titrate back up to 6.25 mg twice daily if necessary.      Relevant Medications   rivaroxaban (XARELTO) 20 MG TABS tablet   CAD -S/P PCI (Chronic)    Over 2 and half years out from her two-vessel PCI in the setting of non-STEMI. Now only on Xarelto.  No longer on aspirin or Plavix.       Relevant Medications   rivaroxaban (XARELTO) 20 MG TABS tablet   Coronary artery disease involving native heart without angina pectoris - Primary (Chronic)    LAD and LCx stented, and RCA being treated medically.  On beta-blocker and calcium channel blocker for antianginal benefit with no active anginal symptoms.  On ACE inhibitor for afterload reduction and BP control.  Not on antiplatelet agent because of Xarelto.  Not tolerant of statins or other medications.  Hoping to get started on inclisiran.      Relevant Medications   rivaroxaban (XARELTO) 20 MG TABS tablet     Other   Statin myopathy    Pretty much intolerant of despite everything she is tried for lipids. PCSK9 inhibitors were limiting more from a financial aspect.  Plan for now is to try to get her started on inclisiran.      Relevant Orders   EKG 12-Lead (Completed)   Hypercoagulation syndrome (HCC) (Chronic)    History of recurrent DVTs and PE secondary to lupus and irregular.  On lifelong Xarelto.  No longer on antiplatelet agent.  Will defer to heme-onc, but should be okay holding Xarelto for procedures, but may very well require bridging with Lovenox.  This should be discussed with her hematologist and not cardiology.      Encounter for smoking cessation counseling (Chronic)  Having a hard time deciding how she is going to quit.  She has cut down, but still has the crutch there.  Still contemplating whether or not she would want to quit.  Made difficult by the fact that her husband also smokes.  3 to 4-minute spent discussing cessation.       ===================================  HPI:    [name] [name] is a 65 y.o. female with a PMH below who presents today for annual f/u.  PMH notable for: Recurrent DVT-PE related to LUPUS ANTICOAGULANT Reluctant to take antiplatelet agent along with DOAC (despite the fact to be clearly said she needed to be on it post PCI. ->  No longer on Thienopyridine  anticoagulation. 3V CAD (NSTEMI Jan 2020 --> she opted for PCI and medical management over CABG -> LAD and LCx PCI with medical management of RCA. Likely from familial hyperlipidemia with difficult to control lipids: Intolerant of simvastatin, atorvastatin, rosuvastatin as well as Vascepa and PCSK9 inhibitors (Repatha)  Seen and [facility] lipid clinic-apparently did not tolerate Repatha. Followed by Dr. Debara [name] -> current plans are to consider inclisiran. Hypertension also difficult to control due to medication intolerance: Did not tolerate thiazides or spironolactone. Currently taking amlodipine 5 mg twice daily and taking 1/2 of a 6.25 mg carvedilol tablet (3.125 mg) twice daily.   [name] [name] was last seen on April 13, 2020.  This was shortly after she had tried starting Repatha.  She stopped taking it partially because of side effects but also because of financial issues.  They are considering possible orthopedic surgery because of right knee injury.  This was delayed because of concerns about clotting issues with lupus anticoagulant.  Had cut down her smoking notably.  Not fully smoking cigarettes and only smoking may be 10.  No longer taking aspirin or Plavix. -> Noted some palpitations associated with anxiety but otherwise no major issues.  No further chest pain.  Recent Hospitalizations: None  She was seen on September 16 by Dr. Debara [name] -> working to get Medicare coverage for inclisiran/Leqvio  Reviewed  CV studies:    The following studies were reviewed today: (if available, images/films reviewed: From Epic Chart or Care Everywhere) None:  Interval History:   [name] [name] returns here today overall doing pretty well.  She is in good spirits.  She seems to be doing well.  Quite active, however she is somewhat limited mobility because of her knee pain.  She never did have surgery done and things seem to be healing okay.  No chest pain or pressure with rest or exertion.   Occasionally has some musculoskeletal symptoms but nothing concerning.  No PND, orthopnea or edema.  No irregular heartbeats or palpitations to suggest arrhythmia. No bleeding issues on Xarelto alone.  CV Review of Symptoms (Summary) Cardiovascular ROS: no chest pain or dyspnea on exertion positive for - ~ Mild exercise intolerance due to Deconditioning, but is trying to do more.  Now try to lose weight.  Mild swelling.  Rare palpitations negative for - irregular heartbeat, orthopnea, paroxysmal nocturnal dyspnea, rapid heart rate, shortness of breath, or lightheadedness, dizziness or wooziness, syncope/near syncope or TIA/amaurosis fugax, claudication.  Palpitations seem to be related to anxiety.  REVIEWED OF SYSTEMS   Review of Systems  Constitutional:  Positive for malaise/fatigue (She does get little bit more worn out than usual, but is trying to be more active now.) and weight loss (Gradually still losing little weight.).  HENT:  Negative for congestion and nosebleeds.  Respiratory:  Positive for cough (Off and on.). Negative for shortness of breath and wheezing.   Cardiovascular:  Negative for claudication.       Per HPI  Gastrointestinal:  Negative for blood in stool and melena.  Genitourinary:  Negative for hematuria.  Musculoskeletal:  Positive for joint pain (Knee is not bothering as bad now.).  Neurological:  Negative for dizziness and focal weakness.  Psychiatric/Behavioral:  Negative for depression and memory loss. The patient is nervous/anxious. The patient does not have insomnia.    I have reviewed and (if needed) personally updated the patient's problem list, medications, allergies, past medical and surgical history, social and family history.   PAST MEDICAL HISTORY   Past Medical History:  Diagnosis Date   Back pain    CAD S/P percutaneous coronary angioplasty 09/13/2018   09/13/2018 - Cath & Staged DES PCI on 09/14/2018: DES PCI: Cx99%&55% - Resolute Onyx DES 3.5 x 30  (3.6 mm), p-mLAD (prox of D1 almost to D2) - Resolute Onyx DES 3.0 x 22 (3.3 mm); DFR on pRCA 70% - 0.99, Not significant -> Rec Med Rx.   DVT (deep venous thrombosis) ([place])    Heart attack ([place]) 09/09/2018   Hypertension    Lupus anticoagulant syndrome (HCC)    NSTEMI (non-ST elevated myocardial infarction) ([place]) 09/12/2018   Initial presentation with chest pain was on 09/09/2018, recurrent pain on 1/26 2020 with positive troponins.  Cath showed three-vessel CAD -> declined CABG, opted for two-vessel DES PCI (LAD and CX, negative DFR RCA)   Pulmonary embolism ([place])     PAST SURGICAL HISTORY   Past Surgical History:  Procedure Laterality Date   BACK SURGERY     COLONOSCOPY  05/19/2012   Procedure: COLONOSCOPY;  Surgeon: Beryle Beams, MD;  Location: [place];  Service: Endoscopy;  Laterality: N/A;   COLONOSCOPY WITH PROPOFOL N/A 09/30/2018   Procedure: COLONOSCOPY WITH PROPOFOL;  Surgeon: Yetta Flock, MD;  Location: [place];  Service: Gastroenterology;  Laterality: N/A;   CORONARY STENT INTERVENTION N/A 09/14/2018   Procedure: CORONARY STENT INTERVENTION;  Surgeon: Martinique, Peter M, MD;  Location: [place] [facility];  Service: Cardiovascular: September 14, 2018- DES PCI: Cx99%&55% - Resolute Onyx DES 3.5 x 30 (3.6 mm), p-mLAD (prox of D1 almost to D2) - Resolute Onyx DES 3.0 x 22 (3.3 mm); DFR on pRCA 70% - 0.99, Not significant -> Rec Med Rx.   ESOPHAGOGASTRODUODENOSCOPY  05/19/2012   Procedure: ESOPHAGOGASTRODUODENOSCOPY (EGD);  Surgeon: Beryle Beams, MD;  Location: [facility];  Service: Endoscopy;  Laterality: N/A;   ESOPHAGOGASTRODUODENOSCOPY (EGD) WITH PROPOFOL N/A 09/30/2018   Procedure: ESOPHAGOGASTRODUODENOSCOPY (EGD) WITH PROPOFOL;  Surgeon: Yetta Flock, MD;  Location: [place];  Service: Gastroenterology;  Laterality: N/A;   GIVENS CAPSULE STUDY N/A 09/30/2018   Procedure: GIVENS CAPSULE STUDY;  Surgeon: Yetta Flock, MD;  Location: [place];   Service: Gastroenterology;  Laterality: N/A;   HOT HEMOSTASIS N/A 09/30/2018   Procedure: HOT HEMOSTASIS (ARGON PLASMA COAGULATION/BICAP);  Surgeon: Yetta Flock, MD;  Location: [facility] ENDOSCOPY;  Service: Gastroenterology;  Laterality: N/A;   INTRAVASCULAR PRESSURE WIRE/FFR STUDY N/A 09/14/2018   Procedure: INTRAVASCULAR PRESSURE WIRE/FFR STUDY;  Surgeon: Martinique, Peter M, MD;  Location: [place] [facility];  Service: Cardiovascular;  Laterality: RCA: DFR on pRCA 70% - 0.99, Not significant -> Rec Med Rx.   LEFT HEART CATH AND CORONARY ANGIOGRAPHY N/A 09/13/2018   Procedure: LEFT HEART CATH AND CORONARY ANGIOGRAPHY;  Surgeon: [name] [name]  W, MD;  Location: [place] [facility];  Service: Cardiovascular:: CULPRIT LESION 99% p-mCx followed by 55%mCx; pLAD 35% A D58fllowed by long 80% lesion @ SP1); pRCA 70%. Normal LVEDP. Global HK EF ~45%.  -Decision on PCI deferred until discussion about PCI versus CABG.  Concern because of long-term DOAC.   TRANSTHORACIC ECHOCARDIOGRAM  09/13/2018   Mildly reduced EF 45 to 50% with diffuse HK.  GR 1 DD.  Mild aortic valve calcification.  MAC.  Moderate pulmonic regurgitation.   Diagnostic 09/13/18   Intervention 09/14/18: PCI Cx & LAD, RCA DFR negative.       Immunization History  Administered Date(s) Administered   Influenza Inj Mdck Quad Pf 05/12/2018   Influenza, Quadrivalent, Recombinant, Inj, Pf 06/12/2019   Influenza,inj,Quad PF,6+ Mos 08/25/2013, 06/21/2015, 06/04/2017   Influenza-Unspecified 05/12/2018   Moderna Sars-Covid-2 Vaccination 08/27/2019, 09/24/2019    MEDICATIONS/ALLERGIES   Current Meds  Medication Sig   Acetaminophen (ACETAMIN PO) Take by mouth.   amLODipine (NORVASC) 5 MG tablet TAKE 1 TABLET BY MOUTH TWICE A DAY   carvedilol (COREG) 6.25 MG tablet TAKE 0.5 TABLETS (3.125 MG TOTAL) BY MOUTH 2 (TWO) TIMES DAILY WITH A MEAL.   Coenzyme Q10 (COQ10) 100 MG CAPS Take 300 capsules by mouth daily.   fenofibrate (TRICOR) 145 MG tablet Take  1 tablet (145 mg total) by mouth daily. Take with a meal.   lisinopril (ZESTRIL) 40 MG tablet TAKE 1 TABLET BY MOUTH EVERY DAY   nitroGLYCERIN (NITROSTAT) 0.4 MG SL tablet Place 1 tablet (0.4 mg total) under the tongue every 5 (five) minutes as needed for up to 3 doses for chest pain.   Omega-3 Fatty Acids (FISH OIL) 1000 MG CPDR Take 3,000 capsules by mouth daily. (Patient taking differently: Take 3,000 capsules by mouth as needed.)   Vitamin D, Cholecalciferol, 25 MCG (1000 UT) CAPS Take 3,000 Units by mouth daily.   [DISCONTINUED] XARELTO 20 MG TABS tablet TAKE 1 TABLET (20 MG TOTAL) BY MOUTH DAILY WITH SUPPER.    Allergies  Allergen Reactions   Prednisone Other (See Comments)    No energy "stalls out".    Makes pt  Jittery.   Tramadol Other (See Comments)    Constipation,  Makes pt  Jittery.   Crestor [Rosuvastatin Calcium]     Feels fatigued, RE  CHALLENGE - UNABLE TO USE 12/28/18   Hctz [Hydrochlorothiazide]     Dizzy    Repatha [Evolocumab]     SOCIAL HISTORY/FAMILY HISTORY   Reviewed in Epic:  Pertinent findings:  Social History   Tobacco Use   Smoking status: Every Day    Packs/day: 0.50    Years: 29.00    Pack years: 14.50    Types: Cigarettes   Smokeless tobacco: Never   Tobacco comments:    Cut back to quarter pack a day  Vaping Use   Vaping Use: Never used  Substance Use Topics   Alcohol use: Yes    Comment: Occasional   Drug use: No   Social History   Social History Narrative   Really is a married mother of 2.  1 son died from complications of spina bifida after multiple surgeries.   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     Drinks social alcohol & denies drug use.     Pt endorses smoking maybe 2 cigarettes/day.  States she placed most of  the cigarette, may have 2 puffs.    OBJCTIVE -PE, EKG, labs   Wt Readings from Last 3 Encounters:  05/10/21 155 lb (70.3 kg)   05/03/21 151 lb (68.5 kg)  11/08/20 164 lb 6.4 oz (74.6 kg)    Physical Exam: BP (!) 150/90 (BP Location: Right Arm, Patient Position: Sitting, Cuff Size: Normal)   Pulse 87   Ht 5' 4.8" (1.646 m)   Wt 155 lb (70.3 kg)   SpO2 99%   BMI 25.95 kg/m  -> Blood pressure this morning was 135/70 at home.  Had not yet taken her medications, and very anxious on the drive in because of traffic. Physical Exam Vitals reviewed.  Constitutional:      General: She is not in acute distress.    Appearance: Normal appearance. She is normal weight. She is not toxic-appearing.     Comments: Well-nourished, well-groomed.  Healthy-appearing.  HENT:     Head: Normocephalic and atraumatic.  Neck:     Vascular: No carotid bruit, hepatojugular reflux or JVD.  Cardiovascular:     Rate and Rhythm: Normal rate and regular rhythm. Occasional Extrasystoles are present.    Chest Wall: PMI is not displaced.     Pulses: Normal pulses.     Heart sounds: S1 normal and S2 normal. Heart sounds are distant. No murmur heard.   No friction rub. No gallop.  Pulmonary:     Effort: Pulmonary effort is normal. No respiratory distress.     Breath sounds: Normal breath sounds.  Chest:     Chest wall: No tenderness.  Musculoskeletal:        General: Swelling (Trivial ankle) present. Normal range of motion.     Cervical back: Normal range of motion and neck supple.  Skin:    General: Skin is warm and dry.  Neurological:     General: No focal deficit present.     Mental Status: She is alert and oriented to person, place, and time. Mental status is at baseline.     Gait: Gait normal.  Psychiatric:        Mood and Affect: Mood normal.        Behavior: Behavior normal.        Thought Content: Thought content normal.        Judgment: Judgment normal.    Adult ECG Report  Rate: 87 ;  Rhythm: normal sinus rhythm; left atrial abnormality, nonspecific ST and T wave changes.  Otherwise normal axis, intervals durations.   Narrative Interpretation: Stable   Recent Labs: Reviewed.  Lipids were discussed with Dr. Debara [name] Lab Results  Component Value Date   CHOL 269 (H) 05/02/2021   HDL 33 (L) 05/02/2021   LDLCALC 170 (H) 05/02/2021   LDLDIRECT 141.0 11/25/2018   TRIG 343 (H) 05/02/2021   CHOLHDL 8.2 (H) 05/02/2021   Lab Results  Component Value Date   CREATININE 0.74 06/14/2020   BUN 7 (L) 06/14/2020   NA 138 06/14/2020   K 4.2 06/14/2020   CL 100 06/14/2020   CO2 21 06/14/2020   CBC Latest Ref Rng & Units 05/19/2019 05/16/2019 01/26/2019  WBC 4.0 - 10.5 K/uL 11.3(H) 11.9(H) 8.8  Hemoglobin 12.0 - 15.0 g/dL 14.0 13.0 11.8  Hematocrit 36.0 - 46.0 % 42.9 40.0 36.4  Platelets 150 - 400 K/uL 479(H) 458(H) 455(H)    No results found for: HGBA1C Lab Results  Component Value Date   TSH 0.903 07/19/2020    ==================================================  COVID-19 Education: The signs  and symptoms of COVID-19 were discussed with the patient and how to seek care for testing (follow up with PCP or arrange E-visit).    I spent a total of 69mnutes with the patient spent in direct patient consultation.  Additional time spent with chart review  / charting (studies, outside notes, etc): 15 min Total Time: 45 min  Current medicines are reviewed at length with the patient today.  (+/- concerns) n/a  This visit occurred during the SARS-CoV-2 public health emergency.  Safety protocols were in place, including screening questions prior to the visit, additional usage of staff PPE, and extensive cleaning of exam room while observing appropriate contact time as indicated for disinfecting solutions.  Notice: This dictation was prepared with Dragon dictation along with smaller phrase technology. Any transcriptional errors that result from this process are unintentional and may not be corrected upon review.  Patient Instructions / Medication Changes & Studies & Tests Ordered   Patient Instructions  Medication  Instructions:  No changes   *If you need a refill on your cardiac medications before your next appointment, please call your pharmacy*   Lab Work: Not needed    Testing/Procedures:  Not needed  Follow-Up: At [facility] you and your health needs are our priority.  As part of our continuing mission to provide you with exceptional heart care, we have created designated Provider Care Teams.  These Care Teams include your primary Cardiologist (physician) and Advanced Practice Providers (APPs -  Physician Assistants and Nurse Practitioners) who all work together to provide you with the care you need, when you need it.     Your next appointment:   12 month(s)  The format for your next appointment:   In Person  Provider:   DGlenetta Hew MD    Studies Ordered:   Orders Placed This Encounter  Procedures   EKG 12-Lead      DGlenetta [name] M.D., M.S. Interventional Cardiologist   Pager # [phone] # [phone] [place] [address]  Thank you for choosing [facility] at [facility]!

## 2021-05-28 ENCOUNTER — Encounter: Payer: Self-pay | Admitting: Cardiology

## 2021-05-28 DIAGNOSIS — I2510 Atherosclerotic heart disease of native coronary artery without angina pectoris: Secondary | ICD-10-CM | POA: Insufficient documentation

## 2021-05-28 NOTE — Assessment & Plan Note (Signed)
Following up with Dr. Debara Pickett.  Hopefully, we can get her started on inclisiran.  Really this is the only option we have for her.  She is on fenofibrate and fish oil along with co-Q10.  Recent labs were well out of control.  Continue to address diet and exercise.

## 2021-05-28 NOTE — Assessment & Plan Note (Signed)
LAD and LCx stented, and RCA being treated medically.  On beta-blocker and calcium channel blocker for antianginal benefit with no active anginal symptoms.  On ACE inhibitor for afterload reduction and BP control.  Not on antiplatelet agent because of Xarelto.  Not tolerant of statins or other medications.  Hoping to get started on inclisiran.

## 2021-05-28 NOTE — Assessment & Plan Note (Signed)
Blood pressure borderline elevated today, but if she forgot to take her medications this morning.  Currently on amlodipine 5 mg twice daily, carvedilol 3.125 mg twice daily along with lisinopril 40 mg daily.  Intolerant of diuretics.  Blood pressures at home seem to be better controlled than they are here.  Continue current meds.  We backed off her beta-blocker because of bradycardia and fatigue in the past, we could potentially titrate back up to 6.25 mg twice daily if necessary.

## 2021-05-28 NOTE — Assessment & Plan Note (Signed)
History of recurrent DVTs and PE secondary to lupus and irregular.  On lifelong Xarelto.  No longer on antiplatelet agent.  Will defer to heme-onc, but should be okay holding Xarelto for procedures, but may very well require bridging with Lovenox.  This should be discussed with her hematologist and not cardiology.

## 2021-05-28 NOTE — Assessment & Plan Note (Signed)
2 and half years out from her MI.  No further anginal pain.  RCA being treated medically as it was not FFR positive.  Stents in the LAD and circumflex seem to be doing well.  No CHF symptoms.

## 2021-05-28 NOTE — Assessment & Plan Note (Addendum)
Over 2 and half years out from her two-vessel PCI in the setting of non-STEMI. Now only on Xarelto.  No longer on aspirin or Plavix.

## 2021-05-28 NOTE — Assessment & Plan Note (Signed)
Having a hard time deciding how she is going to quit.  She has cut down, but still has the crutch there.  Still contemplating whether or not she would want to quit.  Made difficult by the fact that her husband also smokes.  3 to 4-minute spent discussing cessation.

## 2021-05-28 NOTE — Assessment & Plan Note (Signed)
Pretty much intolerant of despite everything she is tried for lipids. PCSK9 inhibitors were limiting more from a financial aspect.  Plan for now is to try to get her started on inclisiran.

## 2021-05-31 ENCOUNTER — Other Ambulatory Visit: Payer: Self-pay | Admitting: Family Medicine

## 2021-06-05 ENCOUNTER — Telehealth: Payer: Self-pay | Admitting: Internal Medicine

## 2021-06-05 NOTE — Telephone Encounter (Signed)
Patient has been identified as candidate for Leqvio  Benefits investigation enrollment completed on 05/10/2021  Benefits investigation report notes the following:  Type of insurance: [employer] HMO  OOP Max: $3600 / Met: $30  Co-insurance: 20%  PA required: Y  PA phone number: [phone]

## 2021-06-05 NOTE — Telephone Encounter (Signed)
PA for Leqvio initiated by calling # below. Clinical questions answered. Chart notes, etc will need to be faxed to [phone]   Pending request: [id number]  Total time: 25 minutes

## 2021-06-06 ENCOUNTER — Ambulatory Visit: Payer: Medicare Other | Admitting: Physician Assistant

## 2021-06-13 ENCOUNTER — Ambulatory Visit: Payer: Medicare Other | Admitting: Physician Assistant

## 2021-06-13 ENCOUNTER — Other Ambulatory Visit: Payer: Self-pay

## 2021-06-13 DIAGNOSIS — D3617 Benign neoplasm of peripheral nerves and autonomic nervous system of trunk, unspecified: Secondary | ICD-10-CM

## 2021-06-13 DIAGNOSIS — Z1283 Encounter for screening for malignant neoplasm of skin: Secondary | ICD-10-CM

## 2021-06-13 DIAGNOSIS — D485 Neoplasm of uncertain behavior of skin: Secondary | ICD-10-CM

## 2021-06-13 NOTE — Patient Instructions (Addendum)

## 2021-06-18 ENCOUNTER — Encounter: Payer: Self-pay | Admitting: Physician Assistant

## 2021-06-18 NOTE — Progress Notes (Addendum)
   New Patient Visit   Subjective  [name] [name] is a 65 y.o. female who presents for the following: Annual Exam (Here for annual skin exam. Concerns moles on back. ).   The following portions of the chart were reviewed this encounter and updated as appropriate:  Tobacco  Allergies  Meds  Problems  Med Hx  Surg Hx  Fam Hx      Objective  Well appearing patient in no apparent distress; mood and affect are within normal limits.  A full examination was performed including scalp, head, eyes, ears, nose, lips, neck, chest, axillae, abdomen, back, buttocks, bilateral upper extremities, bilateral lower extremities, hands, feet, fingers, toes, fingernails, and toenails. All findings within normal limits unless otherwise noted below.  Chest - Medial [facility]) Soft white papule.       Assessment & Plan  Neoplasm of uncertain behavior of skin Chest - Medial (Center)  Skin / nail biopsy Type of biopsy: tangential   Informed consent: discussed and consent obtained   Timeout: patient name, date of birth, surgical site, and procedure verified   Anesthesia: the lesion was anesthetized in a standard fashion   Anesthetic:  1% lidocaine w/ epinephrine 1-100,000 local infiltration Instrument used: flexible razor blade   Hemostasis achieved with: aluminum chloride and electrodesiccation   Outcome: patient tolerated procedure well   Post-procedure details: wound care instructions given    Specimen 1 - Surgical pathology Differential Diagnosis: cn v/s tag  Check Margins: No    I, Kamaryn Grimley, PA-C, have reviewed all documentation's for this visit.  The documentation on 06/18/21 for the exam, diagnosis, procedures and orders are all accurate and complete.

## 2021-06-21 NOTE — Telephone Encounter (Signed)
Only other option is a clinical trial - but if she is on a statin, that may be an issue. She could possibly qualify for PREVAIL - will ask Ivin Booty to evaluate.  Dr. Debara Pickett

## 2021-07-02 ENCOUNTER — Telehealth: Payer: Self-pay

## 2021-07-02 NOTE — Telephone Encounter (Signed)
Called patient to discuss the PREVAIL study that she qualifies for with no answer. Message left for a return call

## 2021-07-05 ENCOUNTER — Telehealth: Payer: Self-pay | Admitting: Cardiology

## 2021-07-05 NOTE — Telephone Encounter (Signed)
She is on Xarelto because of her hypercoagulable syndrome from the lupus anticoagulant.  She had been on this prior to her stents.  I am not the one responsible for the Xarelto.  The only other option is Coumadin.  I will be happy to get in touch with the Coumadin clinic.  Glenetta Hew, MD

## 2021-07-05 NOTE — Telephone Encounter (Signed)
Spoke with pt she states she went to pick up her refill 3 months ago for $395. When she went to pick it up today it was $525 "I just can't do that. Getting old is hard enough and than they want to punish you. This Xarelto needs to go." Pt states she has already spoken to Dr. Ellyn Hack about the matter at  her last visit. Will relay message to Dr. Ellyn Hack for advice.

## 2021-07-05 NOTE — Telephone Encounter (Signed)
Patient called and wanted to talk with Dr. Ellyn Hack or nurse in regards to being in a doughnut hole and Xarelto being too high to get.

## 2021-07-08 ENCOUNTER — Encounter: Payer: Self-pay | Admitting: Cardiology

## 2021-07-08 ENCOUNTER — Telehealth: Payer: Self-pay | Admitting: Pharmacist

## 2021-07-08 DIAGNOSIS — Z86718 Personal history of other venous thrombosis and embolism: Secondary | ICD-10-CM

## 2021-07-08 MED ORDER — RIVAROXABAN 20 MG PO TABS: 20.0000 mg | ORAL_TABLET | Freq: Every day | ORAL | 0 refills | Status: DC

## 2021-07-08 NOTE — Telephone Encounter (Signed)
We can transition her to Coumadin, however I would recommend she at least try to apply for patient assistance first .    She can find the information at:  [email or web address]

## 2021-07-08 NOTE — Telephone Encounter (Signed)
The question here is does she want to switch to warfarin or is she going to look into doing the [employer].  We can probably try to provide samples of if not Xarelto, either Eliquis or Pradaxa until we decide which way to go.   Glenetta Hew, MD

## 2021-07-08 NOTE — Telephone Encounter (Signed)
Spoke with patient.  Is not able to afford Xarelto copay due to donut hole pricing.  Advised we can give her samples until the end of the year and then her insurance will turn over in Jan 2023.  Advised we could convert to warfarin however that requires frequent monitoring and patient lives in [place] which would be inconvenient for her.

## 2021-07-08 NOTE — Telephone Encounter (Signed)
Patient called to check on status of call. She only has one pill of Xarelto.  She wants to know if she can be switched to something else.  Can someone please give her a call.

## 2021-07-09 NOTE — Telephone Encounter (Signed)
Spoke with patient last night and discussed pros and cons of staying on Xarelto vs switching to warfarin.  Since patient lives in [place], will be difficult for her to come for frequent INR checks.  Offered to provide samples to patient to get her through the rest of the year until 2023 when her insurance turns over and she can afford copays again.  Patient agreeable and sample are at front

## 2021-07-09 NOTE — Telephone Encounter (Signed)
Burna Forts RPH called patient about anticoagulants

## 2021-07-19 NOTE — Telephone Encounter (Signed)
Thanks [name]!! -I figured she probably did not want to do warfarin.  Glenetta Hew, MD

## 2021-07-23 NOTE — Addendum Note (Signed)
Addended by: Warren Danes on: 07/23/2021 03:38 PM   Modules accepted: Level of Service

## 2021-07-28 ENCOUNTER — Other Ambulatory Visit: Payer: Self-pay | Admitting: Family Medicine

## 2021-07-29 DIAGNOSIS — Z006 Encounter for examination for normal comparison and control in clinical research program: Secondary | ICD-10-CM

## 2021-07-29 NOTE — Research (Signed)
Message sent via [facility] for follow up on participating in the PREVAIL study

## 2021-08-09 ENCOUNTER — Other Ambulatory Visit: Payer: Self-pay | Admitting: Cardiology

## 2021-08-16 ENCOUNTER — Telehealth: Payer: Self-pay | Admitting: Cardiology

## 2021-08-16 MED ORDER — CARVEDILOL 6.25 MG PO TABS: 3.1250 mg | ORAL_TABLET | Freq: Two times a day (BID) | ORAL | 3 refills | Status: DC

## 2021-08-16 NOTE — Telephone Encounter (Signed)
°*  STAT* If patient is at the pharmacy, call can be transferred to refill team.   1. Which medications need to be refilled? (please list name of each medication and dose if known) carvedilol (COREG) 6.25 MG tablet  2. Which pharmacy/location (including street and city if local pharmacy) is medication to be sent to? [id number] [name] [place] [address]   3. Do they need a 30 day or 90 day supply? 90 ds

## 2021-08-29 ENCOUNTER — Telehealth (INDEPENDENT_AMBULATORY_CARE_PROVIDER_SITE_OTHER): Payer: Medicare Other | Admitting: Family Medicine

## 2021-08-29 ENCOUNTER — Encounter: Payer: Self-pay | Admitting: Family Medicine

## 2021-08-29 DIAGNOSIS — U071 COVID-19: Secondary | ICD-10-CM

## 2021-08-29 MED ORDER — MOLNUPIRAVIR EUA 200MG CAPSULE: 4.0000 | ORAL_CAPSULE | Freq: Two times a day (BID) | ORAL | 0 refills | Status: AC

## 2021-08-29 NOTE — Progress Notes (Signed)
Virtual Visit via Telephone Note  I connected with Wave Calzada on 08/29/21 at  4:00 PM EST by telephone and verified that I am speaking with the correct person using two identifiers.   I discussed the limitations, risks, security and privacy concerns of performing an evaluation and management service by telephone and the availability of in person appointments. I also discussed with the patient that there may be a patient responsible charge related to this service. The patient expressed understanding and agreed to proceed.  Location patient: home Location provider: work or home office Participants present for the call: patient, provider Patient did not have a visit in the prior 7 days to address this/these issue(s).  Chief Complaint  Patient presents with   Covid Positive    Positive result last night, has sore throat, fever, headache and just feels terrible. Has only taken tylenol due to other medications she is on, and the tylenol only helps for about an hour.    History of Present Illness: Pt developed cough/cold symptoms and tested positive for COVID yesterday 08/28/2021.  Taking tylenol for fever.  States temperature was 99.36F.  Using Vicks vapor rub.  Hesitant to take any other medications 2/2 Xarelto use.  Also with mild cough, ST, HA "feels like head is in a tunnel", and mild nausea. Denies ear pain, ear pressure, SOB, diarrhea Still has an appetite. Pt's husband tested positive earlier in the wk for COVID.  Thinks may have come in contact with a sick coworker.   Observations/Objective: Patient sounds cheerful and well on the phone. I do not appreciate any SOB. Speech and thought processing are grossly intact. Patient reported vitals:  Assessment and Plan: COVID-19 virus infection - Plan: molnupiravir EUA (LAGEVRIO) 200 mg CAPS capsule Positive at home COVID test on 08/28/2021 with symptoms starting the day.  Seems like a mild relatively mild case of COVID however given  patient's chronic conditions discussed r/b/a of antiviral medication.  Patient wishes to start [place].  Continue supportive care including OTC cough/cold medications for people with high blood pressure, saline nasal rinse or antihistamines, rest, hydration.  Given strict precautions  Follow Up Instructions: Follow-up as needed  [id number] [id number] [id number] 11-20 [id number] 21-30 I did not refer this patient for an OV in the next 24 hours for this/these issue(s).  I discussed the assessment and treatment plan with the patient. The patient was provided an opportunity to ask questions and all were answered. The patient agreed with the plan and demonstrated an understanding of the instructions.   The patient was advised to call back or seek an in-person evaluation if the symptoms worsen or if the condition fails to improve as anticipated.  I provided 7:12 minutes of non-face-to-face time during this encounter.   Billie Ruddy, MD

## 2021-09-14 ENCOUNTER — Other Ambulatory Visit: Payer: Self-pay | Admitting: Family Medicine

## 2021-09-14 ENCOUNTER — Other Ambulatory Visit: Payer: Self-pay | Admitting: Cardiology

## 2021-09-14 ENCOUNTER — Other Ambulatory Visit: Payer: Self-pay | Admitting: Internal Medicine

## 2021-09-16 NOTE — Telephone Encounter (Signed)
Prescription refill request for Xarelto received.  Indication:DVT/PE Last office visit:9/22 Weight:70.3 kg Age:66 IWO:EHOZY Labs CrCl:Needs Labs  Prescription refilled

## 2021-10-16 ENCOUNTER — Other Ambulatory Visit: Payer: Self-pay | Admitting: Cardiology

## 2021-10-16 ENCOUNTER — Telehealth: Payer: Self-pay

## 2021-10-16 DIAGNOSIS — Z86718 Personal history of other venous thrombosis and embolism: Secondary | ICD-10-CM

## 2021-10-16 DIAGNOSIS — I10 Essential (primary) hypertension: Secondary | ICD-10-CM

## 2021-10-16 DIAGNOSIS — Z7902 Long term (current) use of antithrombotics/antiplatelets: Secondary | ICD-10-CM

## 2021-10-16 NOTE — Telephone Encounter (Signed)
Patient needs BMET/CBC for Xarelto refill.  Please come to office. ?

## 2021-10-16 NOTE — Telephone Encounter (Signed)
Prescription refill request for Xarelto received.  ?Indication:DVT/PE ?Last office visit:9/22 ?Weight:70.3 kg ?Age:66 ?HJS:CBIPJ Labs ?CrCl:Needs Labs ? ?Prescription refilled ? ?

## 2021-10-31 ENCOUNTER — Ambulatory Visit (INDEPENDENT_AMBULATORY_CARE_PROVIDER_SITE_OTHER): Payer: Medicare Other

## 2021-10-31 VITALS — Ht 64.0 in | Wt 148.0 lb

## 2021-10-31 DIAGNOSIS — Z0000 Encounter for general adult medical examination without abnormal findings: Secondary | ICD-10-CM

## 2021-10-31 NOTE — Progress Notes (Signed)
? ?Subjective:  ? [name] [name] is a 66 y.o. female who presents for Medicare Annual (Subsequent) preventive examination. ? ?Review of Systems    ?Virtual Visit via Telephone Note ? ?I connected with  [name] on 10/31/21 at  8:15 AM EDT by telephone and verified that I am speaking with the correct person using two identifiers. ? ?Location: ?Patient: Home ?Provider: Office ?Persons participating in the virtual visit: patient/Nurse Health Advisor ?  ?I discussed the limitations, risks, security and privacy concerns of performing an evaluation and management service by telephone and the availability of in person appointments. The patient expressed understanding and agreed to proceed. ? ?Interactive audio and video telecommunications were attempted between this nurse and patient, however failed, due to patient having technical difficulties OR patient did not have access to video capability.  We continued and completed visit with audio only. ? ?Some vital signs may be absent or patient reported.  ? ?Criselda Peaches, LPN  ?Cardiac Risk Factors include: advanced age (>50mn, >>5women);hypertension;smoking/ tobacco exposure ? ?   ?Objective:  ?  ?Today's Vitals  ? 10/31/21 0828  ?Weight: 148 lb (67.1 kg)  ?Height: _0  (1.626 m)  ? ?Body mass index is 25.4 kg/m?. ? ?Advanced Directives 10/31/2021 05/19/2019 05/16/2019 09/28/2018 09/12/2018 12/30/2017 01/23/2016  ?Does Patient Have a Medical Advance Directive? _1  No No  ?Would patient like information on creating a medical advance directive? No - Patient declined No - Patient declined No - Patient declined No - Patient declined No - Patient declined No - Patient declined No - patient declined information  ?Pre-existing out of facility DNR order (yellow form or pink MOST form) - - - - - - -  ? ? ?Current Medications (verified) ?Outpatient Encounter Medications as of 10/31/2021  ?Medication Sig  ? fenofibrate (TRICOR) 145 MG tablet TAKE 1 TABLET (145  MG TOTAL) BY MOUTH DAILY. TAKE WITH A MEAL.  ? Acetaminophen (ACETAMIN PO) Take by mouth.  ? amLODipine (NORVASC) 5 MG tablet TAKE 1 TABLET BY MOUTH TWICE A DAY  ? carvedilol (COREG) 6.25 MG tablet Take 0.5 tablets (3.125 mg total) by mouth 2 (two) times daily with a meal.  ? Coenzyme Q10 (COQ10) 100 MG CAPS Take 300 capsules by mouth daily.  ? lisinopril (ZESTRIL) 40 MG tablet TAKE 1 TABLET BY MOUTH EVERY DAY  ? nitroGLYCERIN (NITROSTAT) 0.4 MG SL tablet Place 1 tablet (0.4 mg total) under the tongue every 5 (five) minutes as needed for up to 3 doses for chest pain.  ? Omega-3 Fatty Acids (FISH OIL) 1000 MG CPDR Take 3,000 capsules by mouth daily. (Patient taking differently: Take 3,000 capsules by mouth as needed.)  ? rivaroxaban (XARELTO) 20 MG TABS tablet Take 1 tablet (20 mg total) by mouth daily with supper.  ? rivaroxaban (XARELTO) 20 MG TABS tablet Take 1 tablet (20 mg total) by mouth daily with supper.  ? rivaroxaban (XARELTO) 20 MG TABS tablet Take 1 tablet (20 mg total) by mouth daily with supper. NEEDS LABS, PLEASE COME TO OFFICE ASAP  ? Vitamin D, Cholecalciferol, 25 MCG (1000 UT) CAPS Take 3,000 Units by mouth daily.  ? ?No facility-administered encounter medications on file as of 10/31/2021.  ? ? ?Allergies (verified) ?Prednisone, Tramadol, Crestor [rosuvastatin calcium], Hctz [hydrochlorothiazide], and Repatha [evolocumab]  ? ?History: ?Past Medical History:  ?Diagnosis Date  ? Back pain   ? CAD S/P percutaneous coronary angioplasty 09/13/2018  ? 09/13/2018 - Cath & Staged DES  PCI on 09/14/2018: DES PCI: Cx99%&55% - Resolute Onyx DES 3.5 x 30 (3.6 mm), p-mLAD (prox of D1 almost to D2) - Resolute Onyx DES 3.0 x 22 (3.3 mm); DFR on pRCA 70% - 0.99, Not significant -> Rec Med Rx.  ? DVT (deep venous thrombosis) ([place])   ? Heart attack ([place]) 09/09/2018  ? Hypertension   ? Lupus anticoagulant syndrome ([facility])   ? NSTEMI (non-ST elevated myocardial infarction) ([place]) 09/12/2018  ? Initial presentation with chest  pain was on 09/09/2018, recurrent pain on 1/26 2020 with positive troponins.  Cath showed three-vessel CAD -> declined CABG, opted for two-vessel DES PCI (LAD and CX, negative DFR RCA)  ? Pulmonary embolism ([place])   ? ?Past Surgical History:  ?Procedure Laterality Date  ? BACK SURGERY    ? COLONOSCOPY  05/19/2012  ? Procedure: COLONOSCOPY;  Surgeon: Beryle Beams, MD;  Location: [facility];  Service: Endoscopy;  Laterality: N/A;  ? COLONOSCOPY WITH PROPOFOL N/A 09/30/2018  ? Procedure: COLONOSCOPY WITH PROPOFOL;  Surgeon: Yetta Flock, MD;  Location: [place];  Service: Gastroenterology;  Laterality: N/A;  ? CORONARY STENT INTERVENTION N/A 09/14/2018  ? Procedure: CORONARY STENT INTERVENTION;  Surgeon: Martinique, Peter M, MD;  Location: [facility];  Service: Cardiovascular: September 14, 2018- DES PCI: Cx99%&55% - Resolute Onyx DES 3.5 x 30 (3.6 mm), p-mLAD (prox of D1 almost to D2) - Resolute Onyx DES 3.0 x 22 (3.3 mm); DFR on pRCA 70% - 0.99, Not significant -> Rec Med Rx.  ? ESOPHAGOGASTRODUODENOSCOPY  05/19/2012  ? Procedure: ESOPHAGOGASTRODUODENOSCOPY (EGD);  Surgeon: Beryle Beams, MD;  Location: [facility];  Service: Endoscopy;  Laterality: N/A;  ? ESOPHAGOGASTRODUODENOSCOPY (EGD) WITH PROPOFOL N/A 09/30/2018  ? Procedure: ESOPHAGOGASTRODUODENOSCOPY (EGD) WITH PROPOFOL;  Surgeon: Yetta Flock, MD;  Location: [place];  Service: Gastroenterology;  Laterality: N/A;  ? GIVENS CAPSULE STUDY N/A 09/30/2018  ? Procedure: GIVENS CAPSULE STUDY;  Surgeon: Yetta Flock, MD;  Location: [place];  Service: Gastroenterology;  Laterality: N/A;  ? HOT HEMOSTASIS N/A 09/30/2018  ? Procedure: HOT HEMOSTASIS (ARGON PLASMA COAGULATION/BICAP);  Surgeon: Yetta Flock, MD;  Location: [facility];  Service: Gastroenterology;  Laterality: N/A;  ? INTRAVASCULAR PRESSURE WIRE/FFR STUDY N/A 09/14/2018  ? Procedure: INTRAVASCULAR PRESSURE WIRE/FFR STUDY;  Surgeon: Martinique, Peter M, MD;   Location: [place] [facility];  Service: Cardiovascular;  Laterality: RCA: DFR on pRCA 70% - 0.99, Not significant -> Rec Med Rx.  ? LEFT HEART CATH AND CORONARY ANGIOGRAPHY N/A 09/13/2018  ? Procedure: LEFT HEART CATH AND CORONARY ANGIOGRAPHY;  Surgeon: Leonie Man, MD;  Location: [place] [facility];  Service: Cardiovascular:: CULPRIT LESION 99% p-mCx followed by 55%mCx; pLAD 35% A D19fllowed by long 80% lesion @ SP1); pRCA 70%. Normal LVEDP. Global HK EF ~45%.  -Decision on PCI deferred until discussion about PCI versus CABG.  Concern because of long-term DOAC.  ? TRANSTHORACIC ECHOCARDIOGRAM  09/13/2018  ? Mildly reduced EF 45 to 50% with diffuse HK.  GR 1 DD.  Mild aortic valve calcification.  MAC.  Moderate pulmonic regurgitation.  ? ?Family History  ?Problem Relation Age of Onset  ? Leukemia Mother   ? Prostate cancer Father   ? Cancer Father   ? Stroke Maternal Grandmother   ? Lung cancer Maternal Grandfather   ? Leukemia Paternal Grandmother   ? Cancer Paternal Grandfather   ? Spina bifida Son   ?     Multiple surgeries  ? Colon cancer  Neg Hx   ? Esophageal cancer Neg Hx   ? ?Social History  ? ?Socioeconomic History  ? Marital status: Married  ?  Spouse name: Not on file  ? Number of children: 2  ? Years of education: Not on file  ? Highest education level: Not on file  ?Occupational History  ? Occupation: SERVICE MANAGER  ?  Employer: [employer]  ?Tobacco Use  ? Smoking status: Every Day  ?  Packs/day: 0.50  ?  Years: 29.00  ?  Pack years: 14.50  ?  Types: Cigarettes  ? Smokeless tobacco: Never  ? Tobacco comments:  ?  Cut back to quarter pack a day  ?Vaping Use  ? Vaping Use: Never used  ?Substance and Sexual Activity  ? Alcohol use: Yes  ?  Comment: Occasional  ? Drug use: No  ? Sexual activity: Not on file  ?Other Topics Concern  ? Not on file  ?Social History Narrative  ? Really is a married mother of 2.  1 son died from complications of spina bifida after multiple surgeries.  ? 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    ? She has a has a Oceanographer in education.    ? Drinks social alcohol & denies drug use.    ? Pt endorses smoking maybe 2 cigarettes/day.  State

## 2021-10-31 NOTE — Patient Instructions (Addendum)
?Ms. [name] [name] , ?Thank you for taking time to come for your Medicare Wellness Visit. I appreciate your ongoing commitment to your health goals. Please review the following plan we discussed and let me know if I can assist you in the future.  ? ?These are the goals we discussed: ? Goals   ? ?   Increase physical activity (pt-stated)   ?   Would like to walk more. ?  ? ?  ?  ?This is a list of the screening recommended for you and due dates:  ?Health Maintenance  ?Topic Date Due  ? Flu Shot  11/15/2021*  ? COVID-19 Vaccine (4 - Booster) 11/16/2021*  ? Zoster (Shingles) Vaccine (1 of 2) 01/31/2022*  ? Pneumonia Vaccine (1 - PCV) 11/01/2022*  ? Mammogram  11/01/2022*  ? DEXA scan (bone density measurement)  11/01/2022*  ? Tetanus Vaccine  05/19/2022  ? Colon Cancer Screening  09/30/2028  ? Hepatitis C Screening: USPSTF Recommendation to screen - Ages 21-79 yo.  Completed  ? HPV Vaccine  Aged Out  ?*Topic was postponed. The date shown is not the original due date.  ? ? ? ?Advanced directives: No Patient deferred ? ?Conditions/risks identified: None ? ?Next appointment: Follow up in one year for your annual wellness visit  ? ? ?Preventive Care 10 Years and Older, Female ?Preventive care refers to lifestyle choices and visits with your health care provider that can promote health and wellness. ?What does preventive care include? ?A yearly physical exam. This is also called an annual well check. ?Dental exams once or twice a year. ?Routine eye exams. Ask your health care provider how often you should have your eyes checked. ?Personal lifestyle choices, including: ?Daily care of your teeth and gums. ?Regular physical activity. ?Eating a healthy diet. ?Avoiding tobacco and drug use. ?Limiting alcohol use. ?Practicing safe sex. ?Taking low-dose aspirin every day. ?Taking vitamin and mineral supplements as recommended by your health care provider. ?What happens during an annual well check? ?The services and screenings done by  your health care provider during your annual well check will depend on your age, overall health, lifestyle risk factors, and family history of disease. ?Counseling  ?Your health care provider may ask you questions about your: ?Alcohol use. ?Tobacco use. ?Drug use. ?Emotional well-being. ?Home and relationship well-being. ?Sexual activity. ?Eating habits. ?History of falls. ?Memory and ability to understand (cognition). ?Work and work Statistician. ?Reproductive health. ?Screening  ?You may have the following tests or measurements: ?Height, weight, and BMI. ?Blood pressure. ?Lipid and cholesterol levels. These may be checked every 5 years, or more frequently if you are over 109 years old. ?Skin check. ?Lung cancer screening. You may have this screening every year starting at age 43 if you have a 30-pack-year history of smoking and currently smoke or have quit within the past 15 years. ?Fecal occult blood test (FOBT) of the stool. You may have this test every year starting at age 32. ?Flexible sigmoidoscopy or colonoscopy. You may have a sigmoidoscopy every 5 years or a colonoscopy every 10 years starting at age 77. ?Hepatitis C blood test. ?Hepatitis B blood test. ?Sexually transmitted disease (STD) testing. ?Diabetes screening. This is done by checking your blood sugar (glucose) after you have not eaten for a while (fasting). You may have this done every 1-3 years. ?Bone density scan. This is done to screen for osteoporosis. You may have this done starting at age 18. ?Mammogram. This may be done every 1-2 years. Talk to your health  care provider about how often you should have regular mammograms. ?Talk with your health care provider about your test results, treatment options, and if necessary, the need for more tests. ?Vaccines  ?Your health care provider may recommend certain vaccines, such as: ?Influenza vaccine. This is recommended every year. ?Tetanus, diphtheria, and acellular pertussis (Tdap, Td) vaccine. You  may need a Td booster every 10 years. ?Zoster vaccine. You may need this after age 93. ?Pneumococcal 13-valent conjugate (PCV13) vaccine. One dose is recommended after age 101. ?Pneumococcal polysaccharide (PPSV23) vaccine. One dose is recommended after age 52. ?Talk to your health care provider about which screenings and vaccines you need and how often you need them. ?This information is not intended to replace advice given to you by your health care provider. Make sure you discuss any questions you have with your health care provider. ?Document Released: 08/31/2015 Document Revised: 04/23/2016 Document Reviewed: 06/05/2015 ?Elsevier Interactive Patient Education ? 2017 [place]. ? ?Fall Prevention in the Home ?Falls can cause injuries. They can happen to people of all ages. There are many things you can do to make your home safe and to help prevent falls. ?What can I do on the outside of my home? ?Regularly fix the edges of walkways and driveways and fix any cracks. ?Remove anything that might make you trip as you walk through a door, such as a raised step or threshold. ?Trim any bushes or trees on the path to your home. ?Use bright outdoor lighting. ?Clear any walking paths of anything that might make someone trip, such as rocks or tools. ?Regularly check to see if handrails are loose or broken. Make sure that both sides of any steps have handrails. ?Any raised decks and porches should have guardrails on the edges. ?Have any leaves, snow, or ice cleared regularly. ?Use sand or salt on walking paths during winter. ?Clean up any spills in your garage right away. This includes oil or grease spills. ?What can I do in the bathroom? ?Use night lights. ?Install grab bars by the toilet and in the tub and shower. Do not use towel bars as grab bars. ?Use non-skid mats or decals in the tub or shower. ?If you need to sit down in the shower, use a plastic, non-slip stool. ?Keep the floor dry. Clean up any water that spills  on the floor as soon as it happens. ?Remove soap buildup in the tub or shower regularly. ?Attach bath mats securely with double-sided non-slip rug tape. ?Do not have throw rugs and other things on the floor that can make you trip. ?What can I do in the bedroom? ?Use night lights. ?Make sure that you have a light by your bed that is easy to reach. ?Do not use any sheets or blankets that are too big for your bed. They should not hang down onto the floor. ?Have a firm chair that has side arms. You can use this for support while you get dressed. ?Do not have throw rugs and other things on the floor that can make you trip. ?What can I do in the kitchen? ?Clean up any spills right away. ?Avoid walking on wet floors. ?Keep items that you use a lot in easy-to-reach places. ?If you need to reach something above you, use a strong step stool that has a grab bar. ?Keep electrical cords out of the way. ?Do not use floor polish or wax that makes floors slippery. If you must use wax, use non-skid floor wax. ?Do not have throw  rugs and other things on the floor that can make you trip. ?What can I do with my stairs? ?Do not leave any items on the stairs. ?Make sure that there are handrails on both sides of the stairs and use them. Fix handrails that are broken or loose. Make sure that handrails are as long as the stairways. ?Check any carpeting to make sure that it is firmly attached to the stairs. Fix any carpet that is loose or worn. ?Avoid having throw rugs at the top or bottom of the stairs. If you do have throw rugs, attach them to the floor with carpet tape. ?Make sure that you have a light switch at the top of the stairs and the bottom of the stairs. If you do not have them, ask someone to add them for you. ?What else can I do to help prevent falls? ?Wear shoes that: ?Do not have high heels. ?Have rubber bottoms. ?Are comfortable and fit you well. ?Are closed at the toe. Do not wear sandals. ?If you use a stepladder: ?Make  sure that it is fully opened. Do not climb a closed stepladder. ?Make sure that both sides of the stepladder are locked into place. ?Ask someone to hold it for you, if possible. ?Clearly mark and make sur

## 2021-11-07 ENCOUNTER — Other Ambulatory Visit: Payer: Self-pay | Admitting: Cardiology

## 2021-11-07 NOTE — Telephone Encounter (Signed)
Prescription refill request for Xarelto received.  ?Indication:DVT/PE ?Last office visit:9/22 ?Weight:67.1 kg ?Age:66 ?YBN:LWHKN Labs ?CrCl:Needs Labs ? ?Prescription refilled ? ?

## 2021-11-15 DIAGNOSIS — I10 Essential (primary) hypertension: Secondary | ICD-10-CM | POA: Diagnosis not present

## 2021-11-15 DIAGNOSIS — Z7902 Long term (current) use of antithrombotics/antiplatelets: Secondary | ICD-10-CM | POA: Diagnosis not present

## 2021-11-15 DIAGNOSIS — Z86718 Personal history of other venous thrombosis and embolism: Secondary | ICD-10-CM | POA: Diagnosis not present

## 2021-11-16 LAB — CBC WITH DIFFERENTIAL/PLATELET
Basophils Absolute: 0 10*3/uL (ref 0.0–0.2)
Basos: 0 %
EOS (ABSOLUTE): 0.2 10*3/uL (ref 0.0–0.4)
Eos: 2 %
Hematocrit: 39.2 % (ref 34.0–46.6)
Hemoglobin: 13.4 g/dL (ref 11.1–15.9)
Immature Grans (Abs): 0.1 10*3/uL (ref 0.0–0.1)
Immature Granulocytes: 1 %
Lymphocytes Absolute: 2.6 10*3/uL (ref 0.7–3.1)
Lymphs: 28 %
MCH: 29.5 pg (ref 26.6–33.0)
MCHC: 34.2 g/dL (ref 31.5–35.7)
MCV: 86 fL (ref 79–97)
Monocytes Absolute: 0.5 10*3/uL (ref 0.1–0.9)
Monocytes: 6 %
Neutrophils Absolute: 5.7 10*3/uL (ref 1.4–7.0)
Neutrophils: 63 %
Platelets: 535 10*3/uL — ABNORMAL HIGH (ref 150–450)
RBC: 4.55 x10E6/uL (ref 3.77–5.28)
RDW: 12.6 % (ref 11.7–15.4)
WBC: 9.1 10*3/uL (ref 3.4–10.8)

## 2021-11-16 LAB — BASIC METABOLIC PANEL
BUN/Creatinine Ratio: 10 — ABNORMAL LOW (ref 12–28)
BUN: 8 mg/dL (ref 8–27)
CO2: 20 mmol/L (ref 20–29)
Calcium: 10.1 mg/dL (ref 8.7–10.3)
Chloride: 101 mmol/L (ref 96–106)
Creatinine, Ser: 0.8 mg/dL (ref 0.57–1.00)
Glucose: 82 mg/dL (ref 70–99)
Potassium: 4.2 mmol/L (ref 3.5–5.2)
Sodium: 137 mmol/L (ref 134–144)
eGFR: 81 mL/min/{1.73_m2} (ref >59)

## 2021-11-18 ENCOUNTER — Telehealth: Payer: Self-pay

## 2021-11-18 NOTE — Telephone Encounter (Signed)
Needs refill ? ?DH ?

## 2021-11-18 NOTE — Telephone Encounter (Signed)
[name] [name]  You 31 minutes ago (10:04 AM)  ? ?Also, had blood work done on 3\31\2023 and results were normal. [facility] ?

## 2021-11-25 ENCOUNTER — Other Ambulatory Visit: Payer: Self-pay | Admitting: Cardiology

## 2021-11-25 NOTE — Telephone Encounter (Signed)
Prescription refill request for Xarelto received.  ?Indication:DVT/PE ?Last office visit:9/22 ?Weight:67.1 kg ?Age:66 ?Scr:0.8 ?CrCl:73.27 ml/min ? ?Prescription refilled ? ?

## 2021-11-28 ENCOUNTER — Other Ambulatory Visit: Payer: Self-pay | Admitting: Family Medicine

## 2022-01-30 ENCOUNTER — Other Ambulatory Visit: Payer: Self-pay | Admitting: Cardiology

## 2022-02-03 ENCOUNTER — Encounter (HOSPITAL_COMMUNITY): Payer: Self-pay

## 2022-02-03 ENCOUNTER — Emergency Department (HOSPITAL_COMMUNITY): Payer: Medicare Other

## 2022-02-03 ENCOUNTER — Emergency Department (HOSPITAL_COMMUNITY)
Admission: EM | Admit: 2022-02-03 | Discharge: 2022-02-03 | Disposition: A | Discharge status: Home / Self Care | Payer: Medicare Other | Attending: Emergency Medicine | Admitting: Emergency Medicine

## 2022-02-03 DIAGNOSIS — I2510 Atherosclerotic heart disease of native coronary artery without angina pectoris: Secondary | ICD-10-CM | POA: Insufficient documentation

## 2022-02-03 DIAGNOSIS — D75839 Thrombocytosis, unspecified: Secondary | ICD-10-CM | POA: Insufficient documentation

## 2022-02-03 DIAGNOSIS — R079 Chest pain, unspecified: Secondary | ICD-10-CM | POA: Diagnosis not present

## 2022-02-03 DIAGNOSIS — Z7901 Long term (current) use of anticoagulants: Secondary | ICD-10-CM | POA: Diagnosis not present

## 2022-02-03 DIAGNOSIS — I493 Ventricular premature depolarization: Secondary | ICD-10-CM | POA: Diagnosis not present

## 2022-02-03 DIAGNOSIS — R0789 Other chest pain: Secondary | ICD-10-CM | POA: Diagnosis not present

## 2022-02-03 DIAGNOSIS — I739 Peripheral vascular disease, unspecified: Secondary | ICD-10-CM

## 2022-02-03 DIAGNOSIS — D72829 Elevated white blood cell count, unspecified: Secondary | ICD-10-CM | POA: Insufficient documentation

## 2022-02-03 LAB — TROPONIN I (HIGH SENSITIVITY)
Troponin I (High Sensitivity): 11 ng/L (ref <18)
Troponin I (High Sensitivity): 10 ng/L (ref <18)

## 2022-02-03 LAB — CBC
HCT: 41.3 % (ref 36.0–46.0)
Hemoglobin: 14 g/dL (ref 12.0–15.0)
MCH: 29.8 pg (ref 26.0–34.0)
MCHC: 33.9 g/dL (ref 30.0–36.0)
MCV: 87.9 fL (ref 80.0–100.0)
Platelets: 471 10*3/uL — ABNORMAL HIGH (ref 150–400)
RBC: 4.7 MIL/uL (ref 3.87–5.11)
RDW: 12.6 % (ref 11.5–15.5)
WBC: 10.8 10*3/uL — ABNORMAL HIGH (ref 4.0–10.5)
nRBC: 0 % (ref 0.0–0.2)

## 2022-02-03 LAB — BASIC METABOLIC PANEL
Anion gap: 10 (ref 5–15)
BUN: 11 mg/dL (ref 8–23)
CO2: 23 mmol/L (ref 22–32)
Calcium: 9.3 mg/dL (ref 8.9–10.3)
Chloride: 105 mmol/L (ref 98–111)
Creatinine, Ser: 0.87 mg/dL (ref 0.44–1.00)
GFR, Estimated: >60 mL/min (ref >60)
Glucose, Bld: 123 mg/dL — ABNORMAL HIGH (ref 70–99)
Potassium: 3.5 mmol/L (ref 3.5–5.1)
Sodium: 138 mmol/L (ref 135–145)

## 2022-02-03 NOTE — ED Notes (Signed)
Pt ambulated to restroom and back w/o difficulty and no complaints of pain.

## 2022-02-03 NOTE — Discharge Instructions (Addendum)
Your cardiac enzymes and EKG were overall reassuring. Your EKG showed did not show significant change compared to prior EKGs but did show evidence of PVCs, your cardiac enzymes were normal. Recommend follow-up with your cardiologist.

## 2022-02-03 NOTE — ED Triage Notes (Signed)
Pt reports that she has been having a funny feeling in her chest, diaphoresis, denies SOB or n/v. Hx of MI

## 2022-02-03 NOTE — ED Provider Notes (Signed)
[facility] EMERGENCY DEPARTMENT Provider Note   CSN: [id number] Arrival date & time: 02/03/22  0110     History  Chief Complaint  Patient presents with   Chest Pain    Marthann Boy is a 66 y.o. female.   Chest Pain   66 year old female with medical history significant for CAD status post percutaneous coronary angioplasty, DVT, lupus anticoagulant syndrome, PE on Xarelto who presents to the emergency department with chest discomfort.  The patient states that she had a "funny feeling in my chest" with some sweatiness, no shortness of breath, nausea or vomiting.  Symptoms lasted for short period of time and have since resolved.  She currently denies any discomfort at this time.  She does have a history of MI and last night when symptoms were ongoing she grew concerned enough to that she decided to present to the emergency department for evaluation.  She has been asymptomatic throughout this morning and denies any complaints at this time.  She denies any cough, fever or chills.  She has been compliant with her Xarelto.  Home Medications Prior to Admission medications   Medication Sig Start Date End Date Taking? Authorizing Provider  fenofibrate (TRICOR) 145 MG tablet TAKE 1 TABLET (145 MG TOTAL) BY MOUTH DAILY. TAKE WITH A MEAL. 09/16/21   Marykay Lex, MD  Acetaminophen (ACETAMIN PO) Take by mouth.    [provider]  amLODipine (NORVASC) 5 MG tablet TAKE 1 TABLET BY MOUTH TWICE A DAY 01/31/22   Marykay Lex, MD  carvedilol (COREG) 6.25 MG tablet Take 0.5 tablets (3.125 mg total) by mouth 2 (two) times daily with a meal. 08/16/21   Marykay Lex, MD  Coenzyme Q10 (COQ10) 100 MG CAPS Take 300 capsules by mouth daily. 04/13/20   Marykay Lex, MD  lisinopril (ZESTRIL) 40 MG tablet TAKE 1 TABLET BY MOUTH EVERY DAY 11/28/21   Deeann Saint, MD  nitroGLYCERIN (NITROSTAT) 0.4 MG SL tablet Place 1 tablet (0.4 mg total) under the tongue every 5  (five) minutes as needed for up to 3 doses for chest pain. 07/19/20   Marykay Lex, MD  Omega-3 Fatty Acids (FISH OIL) 1000 MG CPDR Take 3,000 capsules by mouth daily. Patient taking differently: Take 3,000 capsules by mouth as needed. 04/13/20   Marykay Lex, MD  rivaroxaban (XARELTO) 20 MG TABS tablet Take 1 tablet (20 mg total) by mouth daily with supper. 05/10/21   Marykay Lex, MD  rivaroxaban (XARELTO) 20 MG TABS tablet Take 1 tablet (20 mg total) by mouth daily with supper. 07/08/21   Marykay Lex, MD  rivaroxaban (XARELTO) 20 MG TABS tablet Take 1 tablet (20 mg total) by mouth daily with supper. 11/25/21   Marykay Lex, MD  Vitamin D, Cholecalciferol, 25 MCG (1000 UT) CAPS Take 3,000 Units by mouth daily.    [provider]      Allergies    Prednisone, Tramadol, Crestor [rosuvastatin calcium], Hctz [hydrochlorothiazide], and Repatha [evolocumab]    Review of Systems   Review of Systems  Cardiovascular:  Positive for chest pain.  All other systems reviewed and are negative.   Physical Exam Updated Vital Signs BP (!) 151/70   Pulse 79   Temp 98.1 F (36.7 C) (Oral)   Resp (!) 22   SpO2 99%  Physical Exam Vitals and nursing note reviewed.  Constitutional:      General: She is not in acute distress.  Appearance: She is well-developed.  HENT:     Head: Normocephalic and atraumatic.  Eyes:     Conjunctiva/sclera: Conjunctivae normal.  Neck:     Vascular: No JVD.  Cardiovascular:     Rate and Rhythm: Normal rate and regular rhythm.     Heart sounds: No murmur heard. Pulmonary:     Effort: Pulmonary effort is normal. No respiratory distress.     Breath sounds: Normal breath sounds.  Chest:     Chest wall: No tenderness.  Abdominal:     Palpations: Abdomen is soft.     Tenderness: There is no abdominal tenderness.  Musculoskeletal:        General: No swelling.     Cervical back: Neck supple.     Right lower leg: No edema.     Left lower  leg: No edema.  Skin:    General: Skin is warm and dry.     Capillary Refill: Capillary refill takes less than 2 seconds.  Neurological:     Mental Status: She is alert.  Psychiatric:        Mood and Affect: Mood normal.     ED Results / Procedures / Treatments   Labs (all labs ordered are listed, but only abnormal results are displayed) Labs Reviewed  BASIC METABOLIC PANEL - Abnormal; Notable for the following components:      Result Value   Glucose, Bld 123 (*)    All other components within normal limits  CBC - Abnormal; Notable for the following components:   WBC 10.8 (*)    Platelets 471 (*)    All other components within normal limits  TROPONIN I (HIGH SENSITIVITY)  TROPONIN I (HIGH SENSITIVITY)    EKG EKG Interpretation  Date/Time:  Monday February 03 2022 01:29:30 EDT Ventricular Rate:  95 PR Interval:  160 QRS Duration: 78 QT Interval:  348 QTC Calculation: 437 R Axis:   60 Text Interpretation: Sinus rhythm with occasional Premature ventricular complexes Nonspecific ST abnormality Abnormal ECG When compared with ECG of 19-May-2019 17:17, Premature ventricular complexes are now present Confirmed by Dione Booze ([id number]) on 02/03/2022 2:40:27 AM  Radiology No results found.  Procedures Procedures    Medications Ordered in ED Medications - No data to display  ED Course/ Medical Decision Making/ A&P                           Medical Decision Making Amount and/or Complexity of Data Reviewed Labs: ordered. Radiology: ordered.   66 year old female with medical history significant for CAD status post percutaneous coronary angioplasty, DVT, lupus anticoagulant syndrome, PE on Xarelto who presents to the emergency department with chest discomfort.  The patient states that she had a "funny feeling in my chest" with some sweatiness, no shortness of breath, nausea or vomiting.  Symptoms lasted for short period of time and have since resolved.  She currently denies any  discomfort at this time.  She does have a history of MI and last night when symptoms were ongoing she grew concerned enough to that she decided to present to the emergency department for evaluation.  She has been asymptomatic throughout this morning and denies any complaints at this time.  She denies any cough, fever or chills.  She has been compliant with her Xarelto.  Burley Bartolucci is a 66 y.o. female who presented to the Emergency Department c/o chest pain. Past medical records have been reviewed and are notable for  prior NSTEMI s/p PCI.  Vitals and telemetry on arrival: Afebrile, not tachycardic or tachypneic, mildly hypertensive BP 142/78, subsequently normotensive, saturating well on room air.  Sinus rhythm noted on cardiac telemetry  Pertinent exam findings include: Lungs clear to auscultation bilaterally, no JVD, no lower extremity edema, no murmur, rubs or gallops, no chest wall tenderness. 2+ radial pulses with no deficit.  Differential diagnosis includes: Pleuritis, ACS, pneumonia, pneumothorax, pericarditis/myocarditis, GERD, PUD, musculoskeletal.  EKG: Normal sinus rhythm with a rate of 95, PVCs present with nonspecific ST changes No concerning change from prior  Lab results include: Troponins x2 negative, CBC with a nonspecific very mild leukocytosis to 10.8 with elevated platelets to 471, likely reactive, BMP unremarkable.  CXR: Reviewed and interpreted by myself in addition radiology, negative for acute findings.  Labs unremarkable.  Unlikely pneumonia, no cough, no leukocytosis, no fevers, CXR and exam without acute findings. Unlikely pneumothorax, no findings on  CXR. Unlikely pericarditis/myocarditis, does not fit clinical picture. Chest pain not exertional. Unlikely dissection, no pulse deficit, no tearing chest pain, no neurologic complaints. Due to patient's high risk will rule out ACS.   Patient presenting with nonspecific chest discomfort that bothered her last  night and felt similar to her prior episode of ACS.  She had minimal symptoms prior to her last NSTEMI.  On chart review, the patient underwent PCI to the mid left circumflex with DES x1, PCI to the proximal to mid LAD with DES x1.  She also was found to have a proximal right RCA lesion with 70% stenosis.  She is currently been without chest discomfort for several hours.  She has troponins x2 which are negative.  Low concern for ACS at this time.  No evidence for volume overload, pneumonia or pneumothorax.  Low suspicion for PE.  The patient is not tachycardic or tachypneic, saturating well on room air.  She is compliant with her Eliquis.  No recent concern for DVT with no unilateral swelling or discomfort.  Overall reassuring work-up at this time.  Recommended patient follow-up with her PCP and Dr. Herbie Baltimore for repeat assessment outpatient, consideration for cardiac stress testing and further outpatient work-up.  Final Clinical Impression(s) / ED Diagnoses Final diagnoses:  Chest pain, unspecified type  PVC (premature ventricular contraction)    Rx / DC Orders ED Discharge Orders     None         Ernie Avena, MD 02/07/22 [phone]

## 2022-02-03 NOTE — ED Notes (Signed)
AVS provided to and discussed with patient and family member at bedside. Pt verbalizes understanding of discharge instructions and denies any questions or concerns at this time. Pt has ride home. Pt ambulated out of department independently with steady gait.  

## 2022-02-05 ENCOUNTER — Telehealth: Payer: Self-pay | Admitting: Family Medicine

## 2022-02-05 NOTE — Telephone Encounter (Signed)
Pt concerned about bloodwork, platelets and wbc count.

## 2022-02-07 ENCOUNTER — Telehealth: Payer: Self-pay

## 2022-02-13 ENCOUNTER — Ambulatory Visit (INDEPENDENT_AMBULATORY_CARE_PROVIDER_SITE_OTHER): Payer: Medicare Other

## 2022-02-13 ENCOUNTER — Ambulatory Visit: Payer: Medicare Other | Admitting: Physician Assistant

## 2022-02-13 ENCOUNTER — Encounter: Payer: Self-pay | Admitting: Physician Assistant

## 2022-02-13 VITALS — BP 138/80 | HR 87 | Ht 64.5 in | Wt 148.0 lb

## 2022-02-13 DIAGNOSIS — I2510 Atherosclerotic heart disease of native coronary artery without angina pectoris: Secondary | ICD-10-CM

## 2022-02-13 DIAGNOSIS — Z8673 Personal history of transient ischemic attack (TIA), and cerebral infarction without residual deficits: Secondary | ICD-10-CM | POA: Diagnosis not present

## 2022-02-13 DIAGNOSIS — R002 Palpitations: Secondary | ICD-10-CM | POA: Diagnosis not present

## 2022-02-13 DIAGNOSIS — I739 Peripheral vascular disease, unspecified: Secondary | ICD-10-CM

## 2022-02-13 DIAGNOSIS — Z86718 Personal history of other venous thrombosis and embolism: Secondary | ICD-10-CM | POA: Diagnosis not present

## 2022-02-13 NOTE — Progress Notes (Unsigned)
Enrolled for [employer] to mail a ZIO XT long term holter monitor to the patients address on file.   Dr. Harding to read. 

## 2022-02-13 NOTE — Progress Notes (Signed)
Cardiology Office Note:    Date:  02/14/2022   ID:  [name] [name], DOB 1956/04/24, MRN [id number]  PCP:  Billie Ruddy, MD   [place] Providers Cardiologist:  Glenetta Hew, MD Cardiology APP:  Almyra Deforest, [place]     Referring MD: Billie Ruddy, MD   Chief Complaint  Patient presents with   Follow-up    Seen for Dr. Ellyn Hack.     History of Present Illness:    [name] [name] is a 66 y.o. female with a hx of CVA, history of DVT/PE, lupus anticoagulant syndrome, CAD, hypertension, and hyperlipidemia.  She is intolerant of Crestor and Repatha.  Beta-blocker was previously cut back due to bradycardia and fatigue.  Patient previously underwent cardiac catheterization in January 2020, this revealed 99% mid left circumflex lesion, 80% proximal to mid LAD lesion, 70% proximal RCA lesion.  The case was discussed with CVTS and ultimately patient underwent DES to proximal LAD and proximal to mid left circumflex.  Patient was initially discharged on triple therapy including aspirin, Plavix and Xarelto.  Aspirin and Plavix were eventually discontinued after she completed the course.  Patient was last seen by Dr. Ellyn Hack in September 2020 at which time she was doing well.  More recently, patient was seen in the emergency room on 02/03/2022 due to chest pain.  Renal function and electrolyte were normal.  White blood cell count borderline elevated at 10.8, normal hemoglobin.  Troponin negative x2.  Chest x-ray normal.  Patient presents today for post ED evaluation.  Talking with the patient, she actually denies any recent chest pain.  She denies any recent dyspnea on exertion.  Her issue has been recurrent palpitation for the past several weeks.  The palpitation is described as a fluttering sensation in her chest.  She denies any dizziness, blurred vision or feeling of passing out.  I recommended a 2-week ZIO monitor without live recording feature.  She will need a TSH, her last TSH in  December 2021 was normal.  Patient also complained of claudication symptoms such as leg pain after walking a certain distance.  I recommended ABI.  I plan to see the patient back in 6 weeks.  On physical exam today, her heart rate is quite regular.   Past Medical History:  Diagnosis Date   Back pain    CAD S/P percutaneous coronary angioplasty 09/13/2018   09/13/2018 - Cath & Staged DES PCI on 09/14/2018: DES PCI: Cx99%&55% - Resolute Onyx DES 3.5 x 30 (3.6 mm), p-mLAD (prox of D1 almost to D2) - Resolute Onyx DES 3.0 x 22 (3.3 mm); DFR on pRCA 70% - 0.99, Not significant -> Rec Med Rx.   DVT (deep venous thrombosis) ([place])    Heart attack ([place]) 09/09/2018   Hypertension    Lupus anticoagulant syndrome (HCC)    NSTEMI (non-ST elevated myocardial infarction) ([place]) 09/12/2018   Initial presentation with chest pain was on 09/09/2018, recurrent pain on 1/26 2020 with positive troponins.  Cath showed three-vessel CAD -> declined CABG, opted for two-vessel DES PCI (LAD and CX, negative DFR RCA)   Pulmonary embolism ([place])     Past Surgical History:  Procedure Laterality Date   BACK SURGERY     COLONOSCOPY  05/19/2012   Procedure: COLONOSCOPY;  Surgeon: Beryle Beams, MD;  Location: [facility];  Service: Endoscopy;  Laterality: N/A;   COLONOSCOPY WITH PROPOFOL N/A 09/30/2018   Procedure: COLONOSCOPY WITH PROPOFOL;  Surgeon: Yetta Flock, MD;  Location: [facility]  ENDOSCOPY;  Service: Gastroenterology;  Laterality: N/A;   CORONARY STENT INTERVENTION N/A 09/14/2018   Procedure: CORONARY STENT INTERVENTION;  Surgeon: Martinique, Peter M, MD;  Location: [facility];  Service: Cardiovascular: September 14, 2018- DES PCI: Cx99%&55% - Resolute Onyx DES 3.5 x 30 (3.6 mm), p-mLAD (prox of D1 almost to D2) - Resolute Onyx DES 3.0 x 22 (3.3 mm); DFR on pRCA 70% - 0.99, Not significant -> Rec Med Rx.   ESOPHAGOGASTRODUODENOSCOPY  05/19/2012   Procedure: ESOPHAGOGASTRODUODENOSCOPY (EGD);  Surgeon: Beryle Beams,  MD;  Location: [facility] ENDOSCOPY;  Service: Endoscopy;  Laterality: N/A;   ESOPHAGOGASTRODUODENOSCOPY (EGD) WITH PROPOFOL N/A 09/30/2018   Procedure: ESOPHAGOGASTRODUODENOSCOPY (EGD) WITH PROPOFOL;  Surgeon: Yetta Flock, MD;  Location: [facility];  Service: Gastroenterology;  Laterality: N/A;   GIVENS CAPSULE STUDY N/A 09/30/2018   Procedure: GIVENS CAPSULE STUDY;  Surgeon: Yetta Flock, MD;  Location: [facility];  Service: Gastroenterology;  Laterality: N/A;   HOT HEMOSTASIS N/A 09/30/2018   Procedure: HOT HEMOSTASIS (ARGON PLASMA COAGULATION/BICAP);  Surgeon: Yetta Flock, MD;  Location[facility] ENDOSCOPY;  Service: Gastroenterology;  Laterality: N/A;   INTRAVASCULAR PRESSURE WIRE/FFR STUDY N/A 09/14/2018   Procedure: INTRAVASCULAR PRESSURE WIRE/FFR STUDY;  Surgeon: Martinique, Peter M, MD;  Location: [facility];  Service: Cardiovascular;  Laterality: RCA: DFR on pRCA 70% - 0.99, Not significant -> Rec Med Rx.   LEFT HEART CATH AND CORONARY ANGIOGRAPHY N/A 09/13/2018   Procedure: LEFT HEART CATH AND CORONARY ANGIOGRAPHY;  Surgeon: Leonie Man, MD;  Location: [facility];  Service: Cardiovascular:: CULPRIT LESION 99% p-mCx followed by 55%mCx; pLAD 35% A D61fllowed by long 80% lesion @ SP1); pRCA 70%. Normal LVEDP. Global HK EF ~45%.  -Decision on PCI deferred until discussion about PCI versus CABG.  Concern because of long-term DOAC.   TRANSTHORACIC ECHOCARDIOGRAM  09/13/2018   Mildly reduced EF 45 to 50% with diffuse HK.  GR 1 DD.  Mild aortic valve calcification.  MAC.  Moderate pulmonic regurgitation.    Current Medications: Current Meds  Medication Sig   Acetaminophen (ACETAMIN PO) Take by mouth.   amLODipine (NORVASC) 5 MG tablet TAKE 1 TABLET BY MOUTH TWICE A DAY   carvedilol (COREG) 6.25 MG tablet Take 0.5 tablets (3.125 mg total) by mouth 2 (two) times daily with a meal.   fenofibrate (TRICOR) 145 MG tablet TAKE 1 TABLET (145 MG TOTAL) BY MOUTH DAILY. TAKE  WITH A MEAL.   lisinopril (ZESTRIL) 40 MG tablet TAKE 1 TABLET BY MOUTH EVERY DAY   nitroGLYCERIN (NITROSTAT) 0.4 MG SL tablet Place 1 tablet (0.4 mg total) under the tongue every 5 (five) minutes as needed for up to 3 doses for chest pain.   rivaroxaban (XARELTO) 20 MG TABS tablet Take 1 tablet (20 mg total) by mouth daily with supper.   rivaroxaban (XARELTO) 20 MG TABS tablet Take 1 tablet (20 mg total) by mouth daily with supper.   rivaroxaban (XARELTO) 20 MG TABS tablet Take 1 tablet (20 mg total) by mouth daily with supper.   Vitamin D, Cholecalciferol, 25 MCG (1000 UT) CAPS Take 3,000 Units by mouth daily.     Allergies:   Prednisone, Tramadol, Crestor [rosuvastatin calcium], Hctz [hydrochlorothiazide], and Repatha [evolocumab]   Social History   Socioeconomic History   Marital status: Married    Spouse name: Not on file   Number of children: 2   Years of education: Not on file   Highest education level: Not  on file  Occupational History   Occupation: SERVICE MANAGER    Employer: [employer]  Tobacco Use   Smoking status: Every Day    Packs/day: 0.50    Years: 29.00    Total pack years: 14.50    Types: Cigarettes   Smokeless tobacco: Never   Tobacco comments:    Cut back to quarter pack a day  Vaping Use   Vaping Use: Never used  Substance and Sexual Activity   Alcohol use: Yes    Comment: Occasional   Drug use: No   Sexual activity: Not on file  Other Topics Concern   Not on file  Social History Narrative   Really is a married mother of 2.  1 son died from complications of spina bifida after multiple surgeries.   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     She has a has a Oceanographer in education.     Drinks social alcohol & denies drug use.     Pt endorses smoking maybe 2 cigarettes/day.  States she placed most of the cigarette, may have 2 puffs.   Social Determinants of Health   Financial Resource Strain: Low Risk   (10/31/2021)   Overall Financial Resource Strain (CARDIA)    Difficulty of Paying Living Expenses: Not hard at all  Food Insecurity: No Food Insecurity (10/31/2021)   Hunger Vital Sign    Worried About Running Out of Food in the Last Year: Never true    Ran Out of Food in the Last Year: Never true  Transportation Needs: No Transportation Needs (10/31/2021)   PRAPARE - Hydrologist (Medical): No    Lack of Transportation (Non-Medical): No  Physical Activity: Inactive (10/31/2021)   Exercise Vital Sign    Days of Exercise per Week: 0 days    Minutes of Exercise per Session: 0 min  Stress: No Stress Concern Present (10/31/2021)   [place]    Feeling of Stress : Not at all  Social Connections: [place] (10/31/2021)   Social Connection and Isolation Panel [NHANES]    Frequency of Communication with Friends and Family: More than three times a week    Frequency of Social Gatherings with Friends and Family: More than three times a week    Attends Religious Services: More than 4 times per year    Active Member of [employer] or Organizations: Yes    Attends Music therapist: More than 4 times per year    Marital Status: Married     Family History: The patient's family history includes Cancer in her father and paternal grandfather; Leukemia in her mother and paternal grandmother; Lung cancer in her maternal grandfather; Prostate cancer in her father; Spina bifida in her son; Stroke in her maternal grandmother. There is no history of Colon cancer or Esophageal cancer.  ROS:   Please see the history of present illness.     All other systems reviewed and are negative.  EKGs/Labs/Other Studies Reviewed:    The following studies were reviewed today:  Echo 09/13/2018 LV EF: 45% -   50%   -------------------------------------------------------------------  Indications:      Chest pain  786.51.   -------------------------------------------------------------------  History:   PMH:   Acute myocardial infarction.  Risk factors:  Current tobacco use. Dyslipidemia.   -------------------------------------------------------------------  Study Conclusions   - Left ventricle: The cavity size was normal. Systolic  function was    mildly reduced. The estimated ejection fraction was in the range    of 45% to 50%. Diffuse hypokinesis. Doppler parameters are    consistent with abnormal left ventricular relaxation (grade 1    diastolic dysfunction). Doppler parameters are consistent with    high ventricular filling pressure.  - Aortic valve: Trileaflet; mildly thickened, mildly calcified    leaflets.  - Mitral valve: Calcified annulus. There was trivial regurgitation.  - Pulmonic valve: There was moderate regurgitation.      EKG:  EKG is not ordered today.   Recent Labs: 02/03/2022: BUN 11; Creatinine, Ser 0.87; Hemoglobin 14.0; Platelets 471; Potassium 3.5; Sodium 138  Recent Lipid Panel    Component Value Date/Time   CHOL 269 (H) 05/02/2021 0951   TRIG 343 (H) 05/02/2021 0951   TRIG 681 (HH) 10/27/2014 1155   HDL 33 (L) 05/02/2021 0951   HDL 29 (L) 10/27/2014 1155   CHOLHDL 8.2 (H) 05/02/2021 0951   CHOLHDL 8 11/25/2018 1008   VLDL 74 (H) 09/13/2018 0144   LDLCALC 170 (H) 05/02/2021 0951   LDLCALC NOTES: 08/25/2013 1612   LDLDIRECT 141.0 11/25/2018 1008     Risk Assessment/Calculations:           Physical Exam:    VS:  BP 138/80   Pulse 87   Ht 5' 4.5" (1.638 m)   Wt 148 lb (67.1 kg)   SpO2 97%   BMI 25.01 kg/m     Wt Readings from Last 3 Encounters:  02/13/22 148 lb (67.1 kg)  10/31/21 148 lb (67.1 kg)  05/10/21 155 lb (70.3 kg)     GEN:  Well nourished, well developed in no acute distress HEENT: Normal NECK: No JVD; No carotid bruits LYMPHATICS: No lymphadenopathy CARDIAC: RRR, no murmurs, rubs, gallops RESPIRATORY:  Clear to auscultation  without rales, wheezing or rhonchi  ABDOMEN: Soft, non-tender, non-distended MUSCULOSKELETAL:  No edema; No deformity  SKIN: Warm and dry NEUROLOGIC:  Alert and oriented x 3 PSYCHIATRIC:  Normal affect   ASSESSMENT:    1. Palpitations   2. Claudication ([place])   3. History of DVT of lower extremity   4. H/O: CVA (cerebrovascular accident)   5. Coronary artery disease involving native coronary artery of native heart without angina pectoris    PLAN:    In order of problems listed above:  Palpitation: She recently presented to the emergency room for chest pain and was ruled out.  However patient says that she never truly had chest pain, her symptom has been palpitation.  Patient has been having increased palpitation in the recent weeks.  I recommended heart monitor to further assess.  Will need a TSH  Claudication: Obtain ABI  History of bilateral DVT: On Xarelto  History of CVA: No recent recurrence  CAD: Last PCI 2020.  No recent chest pain.           Medication Adjustments/Labs and Tests Ordered: Current medicines are reviewed at length with the patient today.  Concerns regarding medicines are outlined above.  Orders Placed This Encounter  Procedures   TSH   LONG TERM MONITOR (3-14 DAYS)   VAS [place] ABI WITH/WO TBI   VAS [place] LOWER EXTREMITY ARTERIAL DUPLEX   No orders of the defined types were placed in this encounter.   Patient Instructions  Medication Instructions:  Your physician recommends that you continue on your current medications as directed. Please refer to the Current Medication list given to you today.  *  If you need a refill on your cardiac medications before your next appointment, please call your pharmacy*  Lab Work: Your physician recommends that you return for lab work TODAY:  TSH If you have labs (blood work) drawn today and your tests are completely normal, you will receive your results only by: [place] (if you have [place]) OR A paper copy  in the mail If you have any lab test that is abnormal or we need to change your treatment, we will call you to review the results.  Testing/Procedures: Your physician has requested that you have an ankle brachial index (ABI). During this test an ultrasound and blood pressure cuff are used to evaluate the arteries that supply the arms and legs with blood. Allow thirty minutes for this exam. There are no restrictions or special instructions.  Your physician has requested that you have a lower extremity arterial exercise duplex. During this test, exercise and ultrasound are used to evaluate arterial blood flow in the legs. Allow one hour for this exam. There are no restrictions or special instructions.   ZIO XT- Long Term Monitor Instructions  Your physician has requested you wear a ZIO patch monitor for 14 days.  This is a single patch monitor. [employer] supplies one patch monitor per enrollment. Additional stickers are not available. Please do not apply patch if you will be having a Nuclear Stress Test,  Echocardiogram, Cardiac CT, MRI, or Chest Xray during the period you would be wearing the  monitor. The patch cannot be worn during these tests. You cannot remove and re-apply the  ZIO XT patch monitor.  Your ZIO patch monitor will be mailed 3 day USPS to your address on file. It may take 3-5 days  to receive your monitor after you have been enrolled.  Once you have received your monitor, please review the enclosed instructions. Your monitor  has already been registered assigning a specific monitor serial # to you.  Billing and Patient Assistance Program Information  We have supplied [employer] with any of your insurance information on file for billing purposes. [employer] offers a sliding scale Patient Assistance Program for patients that do not have  insurance, or whose insurance does not completely cover the cost of the ZIO monitor.  You must apply for the Patient Assistance Program to qualify  for this discounted rate.  To apply, please call [employer] at [phone], select option 4, select option 2, ask to apply for  Patient Assistance Program. Theodore Demark will ask your household income, and how many people  are in your household. They will quote your out-of-pocket cost based on that information.  [employer] will also be able to set up a 65-month interest-free payment plan if needed.  Applying the monitor   Shave hair from upper left chest.  Hold abrader disc by orange tab. Rub abrader in 40 strokes over the upper left chest as  indicated in your monitor instructions.  Clean area with 4 enclosed alcohol pads. Let dry.  Apply patch as indicated in monitor instructions. Patch will be placed under collarbone on left  side of chest with arrow pointing upward.  Rub patch adhesive wings for 2 minutes. Remove white label marked "1". Remove the white  label marked "2". Rub patch adhesive wings for 2 additional minutes.  While looking in a mirror, press and release button in center of patch. A small green light will  flash 3-4 times. This will be your only indicator that the monitor has been turned on.  Do not shower for the first 24 hours. You may shower after the first 24 hours.  Press the button if you feel a symptom. You will hear a small click. Record Date, Time and  Symptom in the Patient Logbook.  When you are ready to remove the patch, follow instructions on the last 2 pages of Patient  Logbook. Stick patch monitor onto the last page of Patient Logbook.  Place Patient Logbook in the blue and white box. Use locking tab on box and tape box closed  securely. The blue and white box has prepaid postage on it. Please place it in the mailbox as  soon as possible. Your physician should have your test results approximately 7 days after the  monitor has been mailed back to [facility].  Call [place] at [phone] if you have questions regarding  your ZIO XT patch  monitor. Call them immediately if you see an orange light blinking on your  monitor.  If your monitor falls off in less than 4 days, contact our Monitor department at [phone].  If your monitor becomes loose or falls off after 4 days call [employer] at [phone] for  suggestions on securing your monitor    Follow-Up: At [facility], you and your health needs are our priority.  As part of our continuing mission to provide you with exceptional heart care, we have created designated Provider Care Teams.  These Care Teams include your primary Cardiologist (physician) and Advanced Practice Providers (APPs -  Physician Assistants and Nurse Practitioners) who all work together to provide you with the care you need, when you need it.     Your next appointment:   6 week(s)  The format for your next appointment:   In Person  Provider:   Glenetta Hew, MD  or Almyra Deforest, PA-C        Other Instructions   Important Information About Sugar         Signed, Almyra Deforest, [place]  02/14/2022 11:14 PM    [place]

## 2022-02-13 NOTE — Patient Instructions (Signed)
Medication Instructions:  Your physician recommends that you continue on your current medications as directed. Please refer to the Current Medication list given to you today.  *If you need a refill on your cardiac medications before your next appointment, please call your pharmacy*  Lab Work: Your physician recommends that you return for lab work TODAY:  TSH If you have labs (blood work) drawn today and your tests are completely normal, you will receive your results only by: [place] (if you have MyChart) OR A paper copy in the mail If you have any lab test that is abnormal or we need to change your treatment, we will call you to review the results.  Testing/Procedures: Your physician has requested that you have an ankle brachial index (ABI). During this test an ultrasound and blood pressure cuff are used to evaluate the arteries that supply the arms and legs with blood. Allow thirty minutes for this exam. There are no restrictions or special instructions.  Your physician has requested that you have a lower extremity arterial exercise duplex. During this test, exercise and ultrasound are used to evaluate arterial blood flow in the legs. Allow one hour for this exam. There are no restrictions or special instructions.   ZIO XT- Long Term Monitor Instructions  Your physician has requested you wear a ZIO patch monitor for 14 days.  This is a single patch monitor. [employer] supplies one patch monitor per enrollment. Additional stickers are not available. Please do not apply patch if you will be having a Nuclear Stress Test,  Echocardiogram, Cardiac CT, MRI, or Chest Xray during the period you would be wearing the  monitor. The patch cannot be worn during these tests. You cannot remove and re-apply the  ZIO XT patch monitor.  Your ZIO patch monitor will be mailed 3 day USPS to your address on file. It may take 3-5 days  to receive your monitor after you have been enrolled.  Once you have  received your monitor, please review the enclosed instructions. Your monitor  has already been registered assigning a specific monitor serial # to you.  Billing and Patient Assistance Program Information  We have supplied [employer] with any of your insurance information on file for billing purposes. [employer] offers a sliding scale Patient Assistance Program for patients that do not have  insurance, or whose insurance does not completely cover the cost of the ZIO monitor.  You must apply for the Patient Assistance Program to qualify for this discounted rate.  To apply, please call [employer] at [phone], select option 4, select option 2, ask to apply for  Patient Assistance Program. Theodore Demark will ask your household income, and how many people  are in your household. They will quote your out-of-pocket cost based on that information.  [employer] will also be able to set up a 78-month interest-free payment plan if needed.  Applying the monitor   Shave hair from upper left chest.  Hold abrader disc by orange tab. Rub abrader in 40 strokes over the upper left chest as  indicated in your monitor instructions.  Clean area with 4 enclosed alcohol pads. Let dry.  Apply patch as indicated in monitor instructions. Patch will be placed under collarbone on left  side of chest with arrow pointing upward.  Rub patch adhesive wings for 2 minutes. Remove white label marked "1". Remove the white  label marked "2". Rub patch adhesive wings for 2 additional minutes.  While looking in a mirror, press and release button in  center of patch. A small green light will  flash 3-4 times. This will be your only indicator that the monitor has been turned on.  Do not shower for the first 24 hours. You may shower after the first 24 hours.  Press the button if you feel a symptom. You will hear a small click. Record Date, Time and  Symptom in the Patient Logbook.  When you are ready to remove the patch, follow instructions on  the last 2 pages of Patient  Logbook. Stick patch monitor onto the last page of Patient Logbook.  Place Patient Logbook in the blue and white box. Use locking tab on box and tape box closed  securely. The blue and white box has prepaid postage on it. Please place it in the mailbox as  soon as possible. Your physician should have your test results approximately 7 days after the  monitor has been mailed back to [facility].  Call [place] at [phone] if you have questions regarding  your ZIO XT patch monitor. Call them immediately if you see an orange light blinking on your  monitor.  If your monitor falls off in less than 4 days, contact our Monitor department at [phone].  If your monitor becomes loose or falls off after 4 days call [employer] at [phone] for  suggestions on securing your monitor    Follow-Up: At [facility], you and your health needs are our priority.  As part of our continuing mission to provide you with exceptional heart care, we have created designated Provider Care Teams.  These Care Teams include your primary Cardiologist (physician) and Advanced Practice Providers (APPs -  Physician Assistants and Nurse Practitioners) who all work together to provide you with the care you need, when you need it.     Your next appointment:   6 week(s)  The format for your next appointment:   In Person  Provider:   Glenetta Hew, MD  or Almyra Deforest, PA-C        Other Instructions   Important Information About [place]

## 2022-02-14 ENCOUNTER — Encounter: Payer: Self-pay | Admitting: Physician Assistant

## 2022-02-18 DIAGNOSIS — R002 Palpitations: Secondary | ICD-10-CM

## 2022-02-25 ENCOUNTER — Ambulatory Visit: Payer: Medicare Other | Admitting: Physician Assistant

## 2022-02-27 ENCOUNTER — Ambulatory Visit (HOSPITAL_COMMUNITY)
Admission: RE | Admit: 2022-02-27 | Discharge: 2022-02-27 | Disposition: A | Discharge status: Home / Self Care | Payer: Medicare Other | Source: Ambulatory Visit | Attending: Cardiology | Admitting: Cardiology

## 2022-02-27 DIAGNOSIS — I739 Peripheral vascular disease, unspecified: Secondary | ICD-10-CM | POA: Diagnosis not present

## 2022-02-27 DIAGNOSIS — R002 Palpitations: Secondary | ICD-10-CM | POA: Diagnosis not present

## 2022-02-28 LAB — TSH: TSH: 1.05 u[IU]/mL (ref 0.450–4.500)

## 2022-03-11 DIAGNOSIS — R002 Palpitations: Secondary | ICD-10-CM | POA: Diagnosis not present

## 2022-03-14 ENCOUNTER — Telehealth: Payer: Self-pay | Admitting: Family Medicine

## 2022-03-14 NOTE — Telephone Encounter (Signed)
Lattie Haw from [facility] called and stated she wanted to leave a msg for Dr. concerning pt which is pt is not on proper statin therapy. Call back [phone]  Please advise.

## 2022-03-14 NOTE — Telephone Encounter (Signed)
Spoke to [employer] with [facility] nurse. She ask if patient has allergy to statin. Inform her patient has reaction to Rosuvastatin.

## 2022-03-21 ENCOUNTER — Ambulatory Visit: Payer: Medicare Other | Admitting: Cardiovascular Disease

## 2022-03-21 ENCOUNTER — Encounter: Payer: Self-pay | Admitting: Cardiovascular Disease

## 2022-03-21 VITALS — BP 122/72 | HR 76 | Ht 64.5 in | Wt 146.6 lb

## 2022-03-21 DIAGNOSIS — I739 Peripheral vascular disease, unspecified: Secondary | ICD-10-CM | POA: Diagnosis not present

## 2022-03-21 DIAGNOSIS — M791 Myalgia, unspecified site: Secondary | ICD-10-CM

## 2022-03-21 DIAGNOSIS — I10 Essential (primary) hypertension: Secondary | ICD-10-CM

## 2022-03-21 DIAGNOSIS — T466X5A Adverse effect of antihyperlipidemic and antiarteriosclerotic drugs, initial encounter: Secondary | ICD-10-CM

## 2022-03-21 NOTE — Progress Notes (Signed)
03/21/2022 [name] [name]   10-12-1955  [id number]  Primary Physician Billie Ruddy, MD Primary Cardiologist: Lorretta Harp MD Garret Reddish, [place], [place]  HPI:  [name] [name] is a 66 y.o. mild to moderately overweight married Caucasian female mother of 84, grandmother of 2 grandchildren referred by Almyra Deforest, PA-C for peripheral vascular valuation.  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  Her risk factors include ongoing tobacco abuse 1/2 pack/day, treated hypertension and hyperlipidemia.  She does have CAD status post LAD and circumflex intervention back in January 2020.  She is never had a stroke.  She does have lupus anticoagulant with extensive lower extremity DVT and pulmonary emboli status post endovascular therapy with stenting of her lower extremity venous vasculature as well as placement of an IVC filter.  She was on Coumadin for some time and transition to Xarelto.  When she was on triple therapy for her LAD and circumflex stent she had GI bleeding.  Aspirin was discontinued.  She was not tolerant to Plavix.  She does complain of bilateral calf claudication over the last year with Dopplers performed 02/27/2022 revealing a right ABI of 0.57, left of 0.52.  She had high-grade lesions in her distal right SFA and popliteal artery and an occluded left SFA.   Current Meds  Medication Sig   Acetaminophen (ACETAMIN PO) Take by mouth.   amLODipine (NORVASC) 5 MG tablet TAKE 1 TABLET BY MOUTH TWICE A DAY   carvedilol (COREG) 6.25 MG tablet Take 0.5 tablets (3.125 mg total) by mouth 2 (two) times daily with a meal.   fenofibrate (TRICOR) 145 MG tablet TAKE 1 TABLET (145 MG TOTAL) BY MOUTH DAILY. TAKE WITH A MEAL.   lisinopril (ZESTRIL) 40 MG tablet TAKE 1 TABLET BY MOUTH EVERY DAY   nitroGLYCERIN (NITROSTAT) 0.4 MG SL tablet Place 1 tablet (0.4 mg total) under the tongue every 5 (five) minutes as needed for up to 3 doses for  chest pain.   rivaroxaban (XARELTO) 20 MG TABS tablet Take 1 tablet (20 mg total) by mouth daily with supper.   rivaroxaban (XARELTO) 20 MG TABS tablet Take 1 tablet (20 mg total) by mouth daily with supper.   rivaroxaban (XARELTO) 20 MG TABS tablet Take 1 tablet (20 mg total) by mouth daily with supper.   Vitamin D, Cholecalciferol, 25 MCG (1000 UT) CAPS Take 3,000 Units by mouth daily.     Allergies  Allergen Reactions   Prednisone Other (See Comments)    No energy "stalls out".    Makes pt  Jittery.   Tramadol Other (See Comments)    Constipation,  Makes pt  Jittery.   Crestor [Rosuvastatin Calcium]     Feels fatigued, RE  CHALLENGE - UNABLE TO USE 12/28/18   Hctz [Hydrochlorothiazide]     Dizzy    Repatha [Evolocumab]     Social History   Socioeconomic History   Marital status: Married    Spouse name: Not on file   Number of children: 2   Years of education: Not on file   Highest education level: Not on file  Occupational History   Occupation: SERVICE MANAGER    Employer: [employer]  Tobacco Use   Smoking status: Every Day    Packs/day: 0.50    Years: 29.00    Total pack years: 14.50    Types: Cigarettes   Smokeless tobacco: Never   Tobacco comments:  Cut back to quarter pack a day  Vaping Use   Vaping Use: Never used  Substance and Sexual Activity   Alcohol use: Yes    Comment: Occasional   Drug use: No   Sexual activity: Not on file  Other Topics Concern   Not on file  Social History Narrative   Really is a married mother of 2.  1 son died from complications of spina bifida after multiple surgeries.   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     She has a has a Oceanographer in education.     Drinks social alcohol & denies drug use.     Pt endorses smoking maybe 2 cigarettes/day.  States she placed most of the cigarette, may have 2 puffs.   Social Determinants of Health   Financial Resource Strain: Low Risk   (10/31/2021)   Overall Financial Resource Strain (CARDIA)    Difficulty of Paying Living Expenses: Not hard at all  Food Insecurity: No Food Insecurity (10/31/2021)   Hunger Vital Sign    Worried About Running Out of Food in the Last Year: Never true    Ran Out of Food in the Last Year: Never true  Transportation Needs: No Transportation Needs (10/31/2021)   PRAPARE - Hydrologist (Medical): No    Lack of Transportation (Non-Medical): No  Physical Activity: Inactive (10/31/2021)   Exercise Vital Sign    Days of Exercise per Week: 0 days    Minutes of Exercise per Session: 0 min  Stress: No Stress Concern Present (10/31/2021)   [place]    Feeling of Stress : Not at all  Social Connections: [place] (10/31/2021)   Social Connection and Isolation Panel [NHANES]    Frequency of Communication with Friends and Family: More than three times a week    Frequency of Social Gatherings with Friends and Family: More than three times a week    Attends Religious Services: More than 4 times per year    Active Member of [employer] or Organizations: Yes    Attends Music therapist: More than 4 times per year    Marital Status: Married  Human resources officer Violence: Not At Risk (10/31/2021)   Humiliation, Afraid, Rape, and Kick questionnaire    Fear of Current or Ex-Partner: No    Emotionally Abused: No    Physically Abused: No    Sexually Abused: No     Review of Systems: General: negative for chills, fever, night sweats or weight changes.  Cardiovascular: negative for chest pain, dyspnea on exertion, edema, orthopnea, palpitations, paroxysmal nocturnal dyspnea or shortness of breath Dermatological: negative for rash Respiratory: negative for cough or wheezing Urologic: negative for hematuria Abdominal: negative for nausea, vomiting, diarrhea, bright red blood per rectum, melena, or  hematemesis Neurologic: negative for visual changes, syncope, or dizziness All other systems reviewed and are otherwise negative except as noted above.    Blood pressure 122/72, pulse 76, height 5' 4.5" (1.638 m), weight 146 lb 9.6 oz (66.5 kg), SpO2 100 %.  General appearance: alert and no distress Neck: no adenopathy, no carotid bruit, no JVD, supple, symmetrical, trachea midline, and thyroid not enlarged, symmetric, no tenderness/mass/nodules Lungs: clear to auscultation bilaterally Heart: regular rate and rhythm, S1, S2 normal, no murmur, click, rub or gallop Extremities: extremities normal, atraumatic, no cyanosis or edema Pulses: Diminished pedal pulses Skin: Skin color, texture,  turgor normal. No rashes or lesions Neurologic: Grossly normal  EKG sinus rhythm at 76 with nonspecific ST and T wave changes and occasional PVCs.  I personally reviewed this EKG.  ASSESSMENT AND PLAN:   Peripheral arterial disease ([place]) Ms. Shoaff was referred to me by Almyra Deforest, PA-C because of symptomatic claudication.  She is a patient of Dr. Allison Quarry.  She has a history of CAD status post intervention January 2020.  She has had extensive lower extremity thrombotic disease requiring aggressive endovascular intervention.  She has had pulmonary emboli as well with IV C filters.  She apparently has a lupus anticoagulant and was on Coumadin in the past and currently is on Xarelto.  She has had bleeding on "triple therapy" during her coronary intervention and was pretty symptomatic while on Plavix for a year after her coronary intervention.  She has had claudication for the last year or so.  Dopplers performed 02/27/2022 revealed a right ABI of 0.57 and a left of 0.52.  She has a high-frequency signal in her distal right SFA and popliteal artery as well as an occluded left SFA.  Given her extensive thrombotic history and the fact that she does not have critical limb ischemia in addition to the fact that she was not  particularly tolerant to clopidogrel, I am hesitant to recommend endovascular therapy.     Lorretta Harp MD FACP,FACC,FAHA, [facility] 03/21/2022 8:57 AM

## 2022-03-21 NOTE — Assessment & Plan Note (Signed)
Ms. Rochette was referred to me by Almyra Deforest, PA-C because of symptomatic claudication.  She is a patient of Dr. Allison Quarry.  She has a history of CAD status post intervention January 2020.  She has had extensive lower extremity thrombotic disease requiring aggressive endovascular intervention.  She has had pulmonary emboli as well with IV C filters.  She apparently has a lupus anticoagulant and was on Coumadin in the past and currently is on Xarelto.  She has had bleeding on "triple therapy" during her coronary intervention and was pretty symptomatic while on Plavix for a year after her coronary intervention.  She has had claudication for the last year or so.  Dopplers performed 02/27/2022 revealed a right ABI of 0.57 and a left of 0.52.  She has a high-frequency signal in her distal right SFA and popliteal artery as well as an occluded left SFA.  Given her extensive thrombotic history and the fact that she does not have critical limb ischemia in addition to the fact that she was not particularly tolerant to clopidogrel, I am hesitant to recommend endovascular therapy.

## 2022-03-21 NOTE — Patient Instructions (Signed)
Medication Instructions:  No Changes In Medications at this time.  *If you need a refill on your cardiac medications before your next appointment, please call your pharmacy*  Lab Work: None Ordered At This Time.   Testing/Procedures: None Ordered At This Time.   Follow-Up: At [facility], you and your health needs are our priority.  As part of our continuing mission to provide you with exceptional heart care, we have created designated Provider Care Teams.  These Care Teams include your primary Cardiologist (physician) and Advanced Practice Providers (APPs -  Physician Assistants and Nurse Practitioners) who all work together to provide you with the care you need, when you need it.  Your next appointment:   1 year(s)  The format for your next appointment:   In Person  Provider: Dr. Gwenlyn Found

## 2022-03-25 DIAGNOSIS — T466X5A Adverse effect of antihyperlipidemic and antiarteriosclerotic drugs, initial encounter: Secondary | ICD-10-CM | POA: Insufficient documentation

## 2022-04-04 ENCOUNTER — Ambulatory Visit: Payer: Medicare Other | Admitting: Physician Assistant

## 2022-04-10 ENCOUNTER — Telehealth: Payer: Self-pay | Admitting: Cardiology

## 2022-04-10 NOTE — Telephone Encounter (Signed)
Pt c/o medication issue:  1. Name of Medication: rivaroxaban (XARELTO) 20 MG TABS tablet  2. How are you currently taking this medication (dosage and times per day)? Take 1 tablet (20 mg total) by mouth daily with supper.  3. Are you having a reaction (difficulty breathing--STAT)? no  4. What is your medication issue? Calling to say she cant afford this medication, would like to be put on something different    Patient also asking is there anything she cando for the paint that's in her legs. Please advise

## 2022-04-10 NOTE — Telephone Encounter (Signed)
Returned call to patient, patient reports being in the donut hole and the cost of xarelto has significantly increased.   She states she called her insurance company and it is going to cost $1000 for the rest of the year for Xarelto alone.    She is unable to afford this but understands we cannot supply samples for this extended period of time.   She states she does not qualify for patient assistance.     Information provided for [facility] [employer]-patient will call now to see if she qualifies.     She currently has 2-3 weeks supply.    Patient also reports concerns with increase LE claudication/pain.  She reports seeing Dr. Gwenlyn Found 8/4 for this and reports pain has increased since that time.  She states she is unable to walk for any period of time d/t pain and now has to rest for 5-10 minutes before pain resolves.   She also works M-W and is on her feet a lot.    She is unsure what options there are for her.   Advised would send message to Dr. Gwenlyn Found to review.

## 2022-04-11 ENCOUNTER — Other Ambulatory Visit: Payer: Self-pay

## 2022-04-11 MED ORDER — RIVAROXABAN 20 MG PO TABS: 20.0000 mg | ORAL_TABLET | Freq: Every day | ORAL | 5 refills | Status: DC

## 2022-04-11 NOTE — Telephone Encounter (Signed)
Prescription refill request for Xarelto received.  Indication:DVT/PE Last office visit:8/23 Weight:66.5 kg Age:66 Scr:0.8 CrCl:72.62  Prescription refilled

## 2022-04-18 NOTE — Telephone Encounter (Signed)
Per previous note-patient is interested in [employer].  She was given information to call and see if she qualifies.  She is not interested in changing to coumadin at this time.

## 2022-04-18 NOTE — Telephone Encounter (Signed)
Unfortunately if she does not qualify for patient assistance she will have to switch to warfarin if patient is willing. Will have Coumadin clinic reach out to patient to discuss

## 2022-04-24 ENCOUNTER — Telehealth: Payer: Self-pay | Admitting: Cardiovascular Disease

## 2022-04-24 ENCOUNTER — Telehealth: Payer: Self-pay

## 2022-04-24 NOTE — Telephone Encounter (Signed)
Called patient to give her Zio results. She stated Ellison Carwin already called her. She had no questions or concerns at this time.

## 2022-04-24 NOTE — Telephone Encounter (Addendum)
Called patient and could not leave a voice message due to voice mailbox is not set up, will try calling again.  ----- Message from Almyra Deforest, [place] sent at 04/21/2022 11:54 PM EDT ----- Heart monitor showed few transient fast heart beat, no dangerous type of irregular rhythm. The transient fast bursts does not necessarily need to be treated as they are benign and only lasted a few seconds (none of which were associated with a triggered or diary event suggesting patient did not feel them), however if patient continue to have more palpitation, may increase carvedilol to 6.'25mg'$  BID.

## 2022-04-24 NOTE — Telephone Encounter (Signed)
Patient is returning call regarding heart monitor results. Please advise.

## 2022-04-28 ENCOUNTER — Other Ambulatory Visit: Payer: Self-pay | Admitting: Cardiology

## 2022-05-01 ENCOUNTER — Encounter: Payer: Self-pay | Admitting: Nurse Practitioner

## 2022-05-01 ENCOUNTER — Ambulatory Visit: Payer: Medicare Other | Attending: Nurse Practitioner | Admitting: Nurse Practitioner

## 2022-05-01 VITALS — BP 132/82 | HR 90 | Ht 64.5 in | Wt 149.6 lb

## 2022-05-01 DIAGNOSIS — I2510 Atherosclerotic heart disease of native coronary artery without angina pectoris: Secondary | ICD-10-CM

## 2022-05-01 DIAGNOSIS — R002 Palpitations: Secondary | ICD-10-CM

## 2022-05-01 DIAGNOSIS — Z8673 Personal history of transient ischemic attack (TIA), and cerebral infarction without residual deficits: Secondary | ICD-10-CM | POA: Diagnosis not present

## 2022-05-01 DIAGNOSIS — Z86718 Personal history of other venous thrombosis and embolism: Secondary | ICD-10-CM

## 2022-05-01 DIAGNOSIS — I10 Essential (primary) hypertension: Secondary | ICD-10-CM

## 2022-05-01 DIAGNOSIS — I739 Peripheral vascular disease, unspecified: Secondary | ICD-10-CM

## 2022-05-01 DIAGNOSIS — Z720 Tobacco use: Secondary | ICD-10-CM

## 2022-05-01 DIAGNOSIS — E785 Hyperlipidemia, unspecified: Secondary | ICD-10-CM

## 2022-05-01 DIAGNOSIS — D6862 Lupus anticoagulant syndrome: Secondary | ICD-10-CM | POA: Diagnosis not present

## 2022-05-01 NOTE — Progress Notes (Signed)
Office Visit    Patient Name: [name] [name] Date of Encounter: 05/01/2022  Primary Care Provider:  Billie Ruddy, MD Primary Cardiologist:  Glenetta Hew, MD  Chief Complaint    66 year old female with a history of CAD, PAD, palpitations, atrial tachycardia, PVCs, hypertension, hyperlipidemia, CVA, DVT/PE, and lupus anticoagulant syndrome who presents for follow-up related to CAD and hypertension.   Past Medical History    Past Medical History:  Diagnosis Date   Back pain    CAD S/P percutaneous coronary angioplasty 09/13/2018   09/13/2018 - Cath & Staged DES PCI on 09/14/2018: DES PCI: Cx99%&55% - Resolute Onyx DES 3.5 x 30 (3.6 mm), p-mLAD (prox of D1 almost to D2) - Resolute Onyx DES 3.0 x 22 (3.3 mm); DFR on pRCA 70% - 0.99, Not significant -> Rec Med Rx.   DVT (deep venous thrombosis) ([place])    Heart attack ([place]) 09/09/2018   Hypertension    Lupus anticoagulant syndrome (H[facility])    NSTEMI (non-ST elevated myocardial infarction) ([place]) 09/12/2018   Initial presentation with chest pain was on 09/09/2018, recurrent pain on 1/26 2020 with positive troponins.  Cath showed three-vessel CAD -> declined CABG, opted for two-vessel DES PCI (LAD and CX, negative DFR RCA)   Pulmonary embolism ([employer])    Past Surgical History:  Procedure Laterality Date   BACK SURGERY     COLONOSCOPY  05/19/2012   Procedure: COLONOSCOPY;  Surgeon: Beryle Beams, MD;  Location: [facility];  Service: Endoscopy;  Laterality: N/A;   COLONOSCOPY WITH PROPOFOL N/A 09/30/2018   Procedure: COLONOSCOPY WITH PROPOFOL;  Surgeon: Yetta Flock, MD;  Location: [place];  Service: Gastroenterology;  Laterality: N/A;   CORONARY STENT INTERVENTION N/A 09/14/2018   Procedure: CORONARY STENT INTERVENTION;  Surgeon: Martinique, Peter M, MD;  Location: [facility];  Service: Cardiovascular: September 14, 2018- DES PCI: Cx99%&55% - Resolute Onyx DES 3.5 x 30 (3.6 mm), p-mLAD (prox of D1 almost to D2) - Resolute  Onyx DES 3.0 x 22 (3.3 mm); DFR on pRCA 70% - 0.99, Not significant -> Rec Med Rx.   ESOPHAGOGASTRODUODENOSCOPY  05/19/2012   Procedure: ESOPHAGOGASTRODUODENOSCOPY (EGD);  Surgeon: Beryle Beams, MD;  Location: [facility] ENDOSCOPY;  Service: Endoscopy;  Laterality: N/A;   ESOPHAGOGASTRODUODENOSCOPY (EGD) WITH PROPOFOL N/A 09/30/2018   Procedure: ESOPHAGOGASTRODUODENOSCOPY (EGD) WITH PROPOFOL;  Surgeon: Yetta Flock, MD;  Location: [facility];  Service: Gastroenterology;  Laterality: N/A;   GIVENS CAPSULE STUDY N/A 09/30/2018   Procedure: GIVENS CAPSULE STUDY;  Surgeon: Yetta Flock, MD;  Location: [facility];  Service: Gastroenterology;  Laterality: N/A;   HOT HEMOSTASIS N/A 09/30/2018   Procedure: HOT HEMOSTASIS (ARGON PLASMA COAGULATION/BICAP);  Surgeon: Yetta Flock, MD;  [facility] ENDOSCOPY;  Service: Gastroenterology;  Laterality: N/A;   INTRAVASCULAR PRESSURE WIRE/FFR STUDY N/A 09/14/2018   Procedure: INTRAVASCULAR PRESSURE WIRE/FFR STUDY;  Surgeon: Martinique, Peter M, MD;  Location: [facility];  Service: Cardiovascular;  Laterality: RCA: DFR on pRCA 70% - 0.99, Not significant -> Rec Med Rx.   LEFT HEART CATH AND CORONARY ANGIOGRAPHY N/A 09/13/2018   Procedure: LEFT HEART CATH AND CORONARY ANGIOGRAPHY;  Surgeon: Leonie Man, MD;  Location: [place] [facility];  Service: Cardiovascular:: CULPRIT LESION 99% p-mCx followed by 55%mCx; pLAD 35% A D44fllowed by long 80% lesion @ SP1); pRCA 70%. Normal LVEDP. Global HK EF ~45%.  -Decision on PCI deferred until discussion about PCI versus CABG.  Concern because of long-term DOAC.  TRANSTHORACIC ECHOCARDIOGRAM  09/13/2018   Mildly reduced EF 45 to 50% with diffuse HK.  GR 1 DD.  Mild aortic valve calcification.  MAC.  Moderate pulmonic regurgitation.    Allergies  Allergies  Allergen Reactions   Prednisone Other (See Comments)    No energy "stalls out".    Makes pt  Jittery.   Tramadol Other (See Comments)     Constipation,  Makes pt  Jittery.   Crestor [Rosuvastatin Calcium]     Feels fatigued, RE  CHALLENGE - UNABLE TO USE 12/28/18   Hctz [Hydrochlorothiazide]     Dizzy    Repatha [Evolocumab]     History of Present Illness    66 year old female with the above past medical history including CAD, PAD, palpitations, atrial tachycardia, PVCs, hypertension, hyperlipidemia, CVA, DVT/PE, and lupus anticoagulant syndrome.  Cardiac catheterization in 08/2018 revealed 90% mid left circumflex lesion, 80% proximal LAD lesion, 70% proximal RCA lesion.  Her case was discussed with CT surgery and ultimately she underwent DES-LAD, DES-p-mLCx.  She is intolerant to Crestor and Repatha.  Beta-blocker was previously decreased in the setting of bradycardia and fatigue.  She has a history of recurrent PE/DVT in the setting of lupus anticoagulant syndrome on chronic Xarelto.  She was evaluated in the ED in 01/2022 in the setting of chest pain.  Troponin was negative x2.  Chest x-ray was normal.   She was last seen in the office on 02/13/2022 and reported intermittent palpitations, claudication.  14-day Zio patch revealed sinus rhythm, asymptomatic atrial runs, and symptomatic PVCs.  Lower extremity arterial Dopplers revealed ABIs of 0.57 on the right, left of 0.52, high-grade lesions in the distal right SFA and popliteal artery, occluded left SFA.  She was referred to Dr. Gwenlyn Found who recommended ongoing medical therapy given lack of critical limb ischemia, and extensive thrombotic history as well as previous intolerance to clopidogrel.  She presents today for follow-up. Since her last visit she has been stable from a cardiac standpoint.  She denies any recurrent palpitations.  She thinks that her palpitations were exacerbated by stress.  She does note persistent claudication, she continues to smoke.  She denies any chest pain, dyspnea.  Other than her ongoing claudication, she denies any additional concerns today.  Home  Medications    Current Outpatient Medications  Medication Sig Dispense Refill   Acetaminophen (ACETAMIN PO) Take by mouth.     amLODipine (NORVASC) 5 MG tablet TAKE 1 TABLET BY MOUTH TWICE A DAY 180 tablet 3   carvedilol (COREG) 6.25 MG tablet Take 0.5 tablets (3.125 mg total) by mouth 2 (two) times daily with a meal. 90 tablet 3   fenofibrate (TRICOR) 145 MG tablet TAKE 1 TABLET (145 MG TOTAL) BY MOUTH DAILY. TAKE WITH A MEAL. 90 tablet 3   lisinopril (ZESTRIL) 40 MG tablet TAKE 1 TABLET BY MOUTH EVERY DAY 90 tablet 1   nitroGLYCERIN (NITROSTAT) 0.4 MG SL tablet Place 1 tablet (0.4 mg total) under the tongue every 5 (five) minutes as needed for up to 3 doses for chest pain. 25 tablet 5   rivaroxaban (XARELTO) 20 MG TABS tablet Take 1 tablet (20 mg total) by mouth daily with supper. 42 tablet 0   No current facility-administered medications for this visit.     Review of Systems    She denies chest pain, palpitations, dyspnea, pnd, orthopnea, n, v, dizziness, syncope, edema, weight gain, or early satiety. All other systems reviewed and are otherwise negative except as noted above.  Physical Exam    VS:  BP 132/82   Pulse 90   Ht 5' 4.5" (1.638 m)   Wt 149 lb 9.6 oz (67.9 kg)   SpO2 99%   BMI 25.28 kg/m   GEN: Well nourished, well developed, in no acute distress. HEENT: normal. Neck: Supple, no JVD, carotid bruits, or masses. Cardiac: RRR, no murmurs, rubs, or gallops. No clubbing, cyanosis, edema.  Radials/DP/PT 2+ and equal bilaterally.  Respiratory:  Respirations regular and unlabored, clear to auscultation bilaterally. GI: Soft, nontender, nondistended, BS + x 4. MS: no deformity or atrophy. Skin: warm and dry, no rash. Neuro:  Strength and sensation are intact. Psych: Normal affect.  Accessory Clinical Findings    ECG personally reviewed by me today - no EKG in office today.     Lab Results  Component Value Date   WBC 10.8 (H) 02/03/2022   HGB 14.0 02/03/2022   HCT  41.3 02/03/2022   MCV 87.9 02/03/2022   PLT 471 (H) 02/03/2022   Lab Results  Component Value Date   CREATININE 0.87 02/03/2022   BUN 11 02/03/2022   NA 138 02/03/2022   K 3.5 02/03/2022   CL 105 02/03/2022   CO2 23 02/03/2022   Lab Results  Component Value Date   ALT 9 06/14/2020   AST 14 06/14/2020   ALKPHOS 145 (H) 06/14/2020   BILITOT 0.3 06/14/2020   Lab Results  Component Value Date   CHOL 269 (H) 05/02/2021   HDL 33 (L) 05/02/2021   LDLCALC 170 (H) 05/02/2021   LDLDIRECT 141.0 11/25/2018   TRIG 343 (H) 05/02/2021   CHOLHDL 8.2 (H) 05/02/2021    No results found for: "HGBA1C"  Assessment & Plan    1. CAD: S/p DES-LAD, p-mLCx in 2020. Stable with no anginal symptoms. No indication for ischemic evaluation.  Continue amlodipine, carvedilol, lisinopril, and fenofibrate.  2. Palpitations:  14-day Zio patch revealed sinus rhythm, asymptomatic atrial runs, and symptomatic PVCs.  Denies any recent palpitations, continue carvedilol.  3. PAD: Lower extremity dopplers revealed ABIs of 0.57 on the right, left of 0.52, high-grade lesions in the distal right SFA and popliteal artery, occluded left SFA.  She was referred to Dr. Gwenlyn Found who recommended ongoing medical therapy even lack of critical limb ischemia, and extensive thrombotic history as well as previous intolerance to clopidogrel.  Does note ongoing claudication.  Gust follows stability of follow-up with Dr. Gwenlyn Found, however, given limited treatment options, patient prefers to defer follow-up and  continue to monitor symptoms.  4. Hypertension: BP well controlled. Continue current antihypertensive regimen.   5. Hyperlipidemia: LDL was 170 in 04/2021.  She has seen Dr. Debara Pickett in the past, she has declined research trials, intolerant of statins, PCSK9 inhibitor.  Discussed risks of elevated cholesterol given history of CAD, PAD.  She is not interested in pursuing any further treatment at this time. Continue fenofibrate.  6. H/o  DVT/PE: Continue Xarelto.  7. H/o CVA: No recurrence. On ASA given chronic Xarelto.  Continue fenofibrate.  8. Lupus anticoagulant syndrome: Continue Xarelto.  9. Tobacco use: She continues to smoke.  Full cessation advised.  10. Disposition: Follow-up in 4-6 months with Dr. Ellyn Hack.      Lenna Sciara, NP 05/01/2022, 1:01 PM

## 2022-05-01 NOTE — Patient Instructions (Signed)
Medication Instructions:  No Changes *If you need a refill on your cardiac medications before your next appointment, please call your pharmacy*   Lab Work: No labs  If you have labs (blood work) drawn today and your tests are completely normal, you will receive your results only by: [facility] (if you have MyChart) OR A paper copy in the mail If you have any lab test that is abnormal or we need to change your treatment, we will call you to review the results.   Testing/Procedures: No Testing   Follow-Up: At [facility], you and your health needs are our priority.  As part of our continuing mission to provide you with exceptional heart care, we have created designated Provider Care Teams.  These Care Teams include your primary Cardiologist (physician) and Advanced Practice Providers (APPs -  Physician Assistants and Nurse Practitioners) who all work together to provide you with the care you need, when you need it.  We recommend signing up for the patient portal called "MyChart".  Sign up information is provided on this After Visit Summary.  MyChart is used to connect with patients for Virtual Visits (Telemedicine).  Patients are able to view lab/test results, encounter notes, upcoming appointments, etc.  Non-urgent messages can be sent to your provider as well.   To learn more about what you can do with MyChart, go to [email or web address].    Your next appointment:   4-6 month(s)  The format for your next appointment:   In Person  Provider:   Glenetta Hew, MD

## 2022-05-10 ENCOUNTER — Other Ambulatory Visit: Payer: Self-pay | Admitting: Family Medicine

## 2022-05-13 ENCOUNTER — Encounter: Payer: Self-pay | Admitting: Cardiovascular Disease

## 2022-05-13 NOTE — Telephone Encounter (Signed)
Patient stated she has issues with her right foot x 3 weeks. Her rt foot falls asleep about 1 hour after going to bed and starts aching, that it wakes her up and she has to put her feet on the floor to stop the pain. Her feet stay cold and numb throughout the day and turns white when she walks. She has a lot of pain in the entire foot, but denies calf pain. Please advise.

## 2022-05-19 NOTE — Telephone Encounter (Signed)
Spoke with patient. Appointment made with Dr. Gwenlyn Found for 10/6.

## 2022-05-23 ENCOUNTER — Encounter: Payer: Self-pay | Admitting: Cardiovascular Disease

## 2022-05-23 ENCOUNTER — Ambulatory Visit: Payer: Medicare Other | Attending: Cardiovascular Disease | Admitting: Cardiovascular Disease

## 2022-05-23 ENCOUNTER — Telehealth: Payer: Self-pay | Admitting: Pharmacist

## 2022-05-23 ENCOUNTER — Other Ambulatory Visit: Payer: Self-pay

## 2022-05-23 DIAGNOSIS — Z7902 Long term (current) use of antithrombotics/antiplatelets: Secondary | ICD-10-CM

## 2022-05-23 DIAGNOSIS — I739 Peripheral vascular disease, unspecified: Secondary | ICD-10-CM | POA: Diagnosis not present

## 2022-05-23 DIAGNOSIS — Z86718 Personal history of other venous thrombosis and embolism: Secondary | ICD-10-CM

## 2022-05-23 MED ORDER — ENOXAPARIN SODIUM 100 MG/ML IJ SOSY: 100.0000 mg | PREFILLED_SYRINGE | INTRAMUSCULAR | 0 refills | Status: DC

## 2022-05-23 MED ORDER — CLOPIDOGREL BISULFATE 75 MG PO TABS: 75.0000 mg | ORAL_TABLET | Freq: Every day | ORAL | 3 refills | Status: DC

## 2022-05-23 MED ORDER — SODIUM CHLORIDE 0.9% FLUSH: 3.0000 mL | Freq: Two times a day (BID) | INTRAVENOUS | Status: DC

## 2022-05-23 NOTE — Telephone Encounter (Signed)
   10/12: Last dose of Xarelto   10/13: Inject enoxaparin '100mg'$   in the fatty tissue in the morning  10/14: Inject enoxaparin '100mg'$  in the fatty tissue in the morning  10/15: Inject enoxaparin '100mg'$  in the morning  10/16: Procedure Day - No enoxaparin - Resume Xarelto in the evening or as directed by physician

## 2022-05-23 NOTE — Assessment & Plan Note (Signed)
[name] [name] returns after left being seen 2 months ago.  She is considerably more symptomatic.  She has lifestyle-limiting claudication and dependent rubor.  Her Dopplers performed 02/27/2022 revealed a right ABI of 0.57 with high-grade disease in the distal right SFA and popliteal arteries.  She wishes to proceed with angiography and potential endovascular therapy.  The complicating issue is her lupus anticoagulant and prior DVTs on Xarelto oral anticoagulation.  In addition, she has had issues with "triple therapy" regarding bleeding.  I am going to start her on Plavix several days before the procedure and initiate a Lovenox bridge which will be overseen by our Pharm.D.'s.

## 2022-05-23 NOTE — Patient Instructions (Signed)
Medication Instructions:   -Start clopidogrel (plavix) '75mg'$  once daily. Start taking on Thursday, Oct 12th.  -Lovenox bridge per PharmD.  *If you need a refill on your cardiac medications before your next appointment, please call your pharmacy*   Lab Work: Your physician recommends that you have labs drawn today: BMET & CBC  If you have labs (blood work) drawn today and your tests are completely normal, you will receive your results only by: [facility] (if you have MyChart) OR A paper copy in the mail If you have any lab test that is abnormal or we need to change your treatment, we will call you to review the results.   Testing/Procedures: Your physician has requested that you have a lower extremity arterial duplex. This test is an ultrasound of the arteries in the legs. It looks at arterial blood flow in the legs. Allow one hour for Lower Arterial scans. There are no restrictions or special instructions. To be done 1-2 weeks after your procedure (10/16). This will be done at [place]  Your physician has requested that you have an ankle brachial index (ABI). During this test an ultrasound and blood pressure cuff are used to evaluate the arteries that supply the arms and legs with blood. Allow thirty minutes for this exam. There are no restrictions or special instructions. To be done 1-2 weeks after your procedure (10/16). This will be done at [place]. [address]    Follow-Up: At [facility], you and your health needs are our priority.  As part of our continuing mission to provide you with exceptional heart care, we have created designated Provider Care Teams.  These Care Teams include your primary Cardiologist (physician) and Advanced Practice Providers (APPs -  Physician Assistants and Nurse Practitioners) who all work together to provide you with the care you need, when you need it.  We recommend signing up for the patient portal called  "MyChart".  Sign up information is provided on this After Visit Summary.  MyChart is used to connect with patients for Virtual Visits (Telemedicine).  Patients are able to view lab/test results, encounter notes, upcoming appointments, etc.  Non-urgent messages can be sent to your provider as well.   To learn more about what you can do with MyChart, go to [email or web address].    Your next appointment:   2-3 week(s) after procedure (10/16)  The format for your next appointment:   In Person  Provider:   Quay Burow, MD     Other Instructions       Cardiac/Peripheral Catheterization   You are scheduled for a Peripheral Angiogram on Monday, October 16 with Dr. Quay Burow.  1. Please arrive at the Main Entrance A at [facility]: [place], [place] [address] on October 16 at 10:00 AM (This time is two hours before your procedure to ensure your preparation). Free valet parking service is available. You will check in at ADMITTING. The support person will be asked to wait in the waiting room.  It is OK to have someone drop you off and come back when you are ready to be discharged.        Special note: Every effort is made to have your procedure done on time. Please understand that emergencies sometimes delay scheduled procedures.   . 2. Diet: Do not eat solid foods after midnight.  You may have clear liquids until 5 AM the day of the procedure.  3. Labs: You  will need to have blood drawn today (10/6)  4. Medication instructions in preparation for your procedure:    Lovenox bridge per PharmD.   On the morning of your procedure, take Aspirin 81 mg and Plavix/Clopidogrel and any morning medicines NOT listed above.  You may use sips of water.  5. Plan to go home the same day, you will only stay overnight if medically necessary. 6. You MUST have a responsible adult to drive you home. 7. An adult MUST be with you the first 24 hours after you arrive home. 8.  Bring a current list of your medications, and the last time and date medication taken. 9. Bring ID and current insurance cards. 10.Please wear clothes that are easy to get on and off and wear slip-on shoes.  Thank you for allowing [place] to care for you!   -- [facility]

## 2022-05-23 NOTE — Progress Notes (Signed)
05/23/2022 [name] [name]   [birth year]  [id number]  Primary Physician [name] Ruddy, MD Primary Cardiologist: [name] Harp MD [name] [name], [place], [place]  HPI:  [name] [name] is a 66 y.o.   mild to moderately overweight married Caucasian female mother of 66, grandmother of 2 grandchildren referred by [name] Deforest, PA-C for peripheral vascular valuation.  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  I last saw her in the office 03/21/2022.  Her risk factors include ongoing tobacco abuse 1/2 pack/day, treated hypertension and hyperlipidemia.  She does have CAD status post LAD and circumflex intervention back in January 2020.  She is never had a stroke.  She does have lupus anticoagulant with extensive lower extremity DVT and pulmonary emboli status post endovascular therapy with stenting of her lower extremity venous vasculature as well as placement of an IVC filter.  She was on Coumadin for some time and transition to Xarelto.  When she was on triple therapy for her LAD and circumflex stent she had GI bleeding.  Aspirin was discontinued.  She was not tolerant to Plavix.  She does complain of bilateral calf claudication over the last year with Dopplers performed 02/27/2022 revealing a right ABI of 0.57, left of 0.52.  She had high-grade lesions in her distal right SFA and popliteal artery and an occluded left SFA.  Since I saw her 2 months ago she has become more symptomatic on the right side.  She has dependent rubor, resting foot pain and right calf claudication that prevents her from exercising.  She wishes to proceed with outpatient diagnostic peripheral angiography and potential endovascular therapy.  We will have to be mindful of her clotting issues with her lupus anticoagulant being on Xarelto.  I will have our Pharm.D.'s orchestrate a Lovenox bridge.   Current Meds  Medication Sig   Acetaminophen (ACETAMIN PO) Take by mouth.    amLODipine (NORVASC) 5 MG tablet TAKE 1 TABLET BY MOUTH TWICE A DAY   carvedilol (COREG) 6.25 MG tablet Take 0.5 tablets (3.125 mg total) by mouth 2 (two) times daily with a meal.   fenofibrate (TRICOR) 145 MG tablet TAKE 1 TABLET (145 MG TOTAL) BY MOUTH DAILY. TAKE WITH A MEAL.   lisinopril (ZESTRIL) 40 MG tablet TAKE 1 TABLET BY MOUTH EVERY DAY   nitroGLYCERIN (NITROSTAT) 0.4 MG SL tablet Place 1 tablet (0.4 mg total) under the tongue every 5 (five) minutes as needed for up to 3 doses for chest pain.   rivaroxaban (XARELTO) 20 MG TABS tablet Take 1 tablet (20 mg total) by mouth daily with supper.     Allergies  Allergen Reactions   Prednisone Other (See Comments)    No energy "stalls out".    Makes pt  Jittery.   Tramadol Other (See Comments)    Constipation,  Makes pt  Jittery.   Crestor [Rosuvastatin Calcium]     Feels fatigued, RE  CHALLENGE - UNABLE TO USE 12/28/18   Hctz [Hydrochlorothiazide]     Dizzy    Repatha [Evolocumab]     Social History   Socioeconomic History   Marital status: Married    Spouse name: Not on file   Number of children: 2   Years of education: Not on file   Highest education level: Not on file  Occupational History   Occupation: SERVICE MANAGER    Employer: [employer]  Tobacco Use   Smoking status: Every Day  Packs/day: 0.50    Years: 29.00    Total pack years: 14.50    Types: Cigarettes   Smokeless tobacco: Never   Tobacco comments:    Cut back to quarter pack a day  Vaping Use   Vaping Use: Never used  Substance and Sexual Activity   Alcohol use: Yes    Comment: Occasional   Drug use: No   Sexual activity: Not on file  Other Topics Concern   Not on file  Social History Narrative   Really is a married mother of 2.  1 son died from complications of spina bifida after multiple surgeries.   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     She has a has a Oceanographer in education.     Drinks social  alcohol & denies drug use.     Pt endorses smoking maybe 2 cigarettes/day.  States she placed most of the cigarette, may have 2 puffs.   Social Determinants of Health   Financial Resource Strain: Low Risk  (10/31/2021)   Overall Financial Resource Strain (CARDIA)    Difficulty of Paying Living Expenses: Not hard at all  Food Insecurity: No Food Insecurity (10/31/2021)   Hunger Vital Sign    Worried About Running Out of Food in the Last Year: Never true    Ran Out of Food in the Last Year: Never true  Transportation Needs: No Transportation Needs (10/31/2021)   PRAPARE - Hydrologist (Medical): No    Lack of Transportation (Non-Medical): No  Physical Activity: Inactive (10/31/2021)   Exercise Vital Sign    Days of Exercise per Week: 0 days    Minutes of Exercise per Session: 0 min  Stress: No Stress Concern Present (10/31/2021)   [place]    Feeling of Stress : Not at all  Social Connections: [place] (10/31/2021)   Social Connection and Isolation Panel [NHANES]    Frequency of Communication with Friends and Family: More than three times a week    Frequency of Social Gatherings with Friends and Family: More than three times a week    Attends Religious Services: More than 4 times per year    Active Member of [employer] or Organizations: Yes    Attends Music therapist: More than 4 times per year    Marital Status: Married  Human resources officer Violence: Not At Risk (10/31/2021)   Humiliation, Afraid, Rape, and Kick questionnaire    Fear of Current or Ex-Partner: No    Emotionally Abused: No    Physically Abused: No    Sexually Abused: No     Review of Systems: General: negative for chills, fever, night sweats or weight changes.  Cardiovascular: negative for chest pain, dyspnea on exertion, edema, orthopnea, palpitations, paroxysmal nocturnal dyspnea or shortness of  breath Dermatological: negative for rash Respiratory: negative for cough or wheezing Urologic: negative for hematuria Abdominal: negative for nausea, vomiting, diarrhea, bright red blood per rectum, melena, or hematemesis Neurologic: negative for visual changes, syncope, or dizziness All other systems reviewed and are otherwise negative except as noted above.    Blood pressure 132/76, pulse 84, height 5' 4.5" (1.638 m), weight 150 lb (68 kg), SpO2 100 %.  General appearance: alert and no distress Neck: no adenopathy, no carotid bruit, no JVD, supple, symmetrical, trachea midline, and thyroid not enlarged, symmetric, no tenderness/mass/nodules Lungs: clear to auscultation bilaterally Heart:  regular rate and rhythm, S1, S2 normal, no murmur, click, rub or gallop Extremities: extremities normal, atraumatic, no cyanosis or edema Pulses: Absent pedal pulses Skin: Skin color, texture, turgor normal. No rashes or lesions Neurologic: Grossly normal  EKG not performed today  ASSESSMENT AND PLAN:   Peripheral arterial disease ([place]) Ms. Greenlaw returns after left being seen 2 months ago.  She is considerably more symptomatic.  She has lifestyle-limiting claudication and dependent rubor.  Her Dopplers performed 02/27/2022 revealed a right ABI of 0.57 with high-grade disease in the distal right SFA and popliteal arteries.  She wishes to proceed with angiography and potential endovascular therapy.  The complicating issue is her lupus anticoagulant and prior DVTs on Xarelto oral anticoagulation.  In addition, she has had issues with "triple therapy" regarding bleeding.  I am going to start her on Plavix several days before the procedure and initiate a Lovenox bridge which will be overseen by our Pharm.D.'s.     [name] Harp MD FACP,FACC,FAHA, [facility] 05/23/2022 9:01 AM

## 2022-05-24 LAB — CBC
Hematocrit: 40.4 % (ref 34.0–46.6)
Hemoglobin: 13.8 g/dL (ref 11.1–15.9)
MCH: 29.8 pg (ref 26.6–33.0)
MCHC: 34.2 g/dL (ref 31.5–35.7)
MCV: 87 fL (ref 79–97)
Platelets: 496 10*3/uL — ABNORMAL HIGH (ref 150–450)
RBC: 4.63 x10E6/uL (ref 3.77–5.28)
RDW: 12.2 % (ref 11.7–15.4)
WBC: 8.3 10*3/uL (ref 3.4–10.8)

## 2022-05-24 LAB — BASIC METABOLIC PANEL
BUN/Creatinine Ratio: 8 — ABNORMAL LOW (ref 12–28)
BUN: 6 mg/dL — ABNORMAL LOW (ref 8–27)
CO2: 23 mmol/L (ref 20–29)
Calcium: 10.3 mg/dL (ref 8.7–10.3)
Chloride: 103 mmol/L (ref 96–106)
Creatinine, Ser: 0.72 mg/dL (ref 0.57–1.00)
Glucose: 88 mg/dL (ref 70–99)
Potassium: 5.1 mmol/L (ref 3.5–5.2)
Sodium: 142 mmol/L (ref 134–144)
eGFR: 92 mL/min/{1.73_m2} (ref >59)

## 2022-05-29 ENCOUNTER — Telehealth: Payer: Self-pay | Admitting: *Deleted

## 2022-05-29 NOTE — Telephone Encounter (Signed)
Abdominal Aortogram  scheduled at [facility] for: Monday June 02, 2022 12 Noon Arrival time and place: [facility] Main Entrance A at: 10 AM  Nothing to eat after midnight prior to procedure, clear liquids until 5 AM day of procedure.  Medication instructions: -Hold:  Xarelto-last dose Xarelto 05/29/22-Lovenox bridge-see 05/23/22 Telephone Note for details -Except hold medications usual morning medications can be taken with sips of water including aspirin 81 mg and Plavix 75 mg (will start 05/29/22).  Confirmed patient has responsible adult to drive home post procedure and be with patient first 24 hours after arriving home.  Patient reports no new symptoms concerning for COVID-19 in the past 10 days.  Reviewed procedure instructions with patient.

## 2022-06-02 ENCOUNTER — Ambulatory Visit (HOSPITAL_COMMUNITY)
Admission: RE | Admit: 2022-06-02 | Discharge: 2022-06-03 | Disposition: A | Discharge status: Home / Self Care | Payer: Medicare Other | Attending: Cardiovascular Disease | Admitting: Cardiovascular Disease

## 2022-06-02 ENCOUNTER — Inpatient Hospital Stay (HOSPITAL_COMMUNITY)
Admission: RE | Disposition: A | Discharge status: Home / Self Care | Payer: Self-pay | Source: Home / Self Care | Attending: Cardiovascular Disease

## 2022-06-02 ENCOUNTER — Other Ambulatory Visit: Payer: Self-pay

## 2022-06-02 DIAGNOSIS — I700 Atherosclerosis of aorta: Secondary | ICD-10-CM | POA: Diagnosis not present

## 2022-06-02 DIAGNOSIS — S8012XA Contusion of left lower leg, initial encounter: Secondary | ICD-10-CM | POA: Diagnosis not present

## 2022-06-02 DIAGNOSIS — I739 Peripheral vascular disease, unspecified: Secondary | ICD-10-CM | POA: Diagnosis present

## 2022-06-02 DIAGNOSIS — F17210 Nicotine dependence, cigarettes, uncomplicated: Secondary | ICD-10-CM | POA: Diagnosis not present

## 2022-06-02 DIAGNOSIS — R109 Unspecified abdominal pain: Secondary | ICD-10-CM | POA: Diagnosis not present

## 2022-06-02 DIAGNOSIS — I70221 Atherosclerosis of native arteries of extremities with rest pain, right leg: Secondary | ICD-10-CM | POA: Diagnosis not present

## 2022-06-02 DIAGNOSIS — Z86718 Personal history of other venous thrombosis and embolism: Secondary | ICD-10-CM | POA: Insufficient documentation

## 2022-06-02 DIAGNOSIS — I2510 Atherosclerotic heart disease of native coronary artery without angina pectoris: Secondary | ICD-10-CM | POA: Diagnosis not present

## 2022-06-02 DIAGNOSIS — I97638 Postprocedural hematoma of a circulatory system organ or structure following other circulatory system procedure: Secondary | ICD-10-CM | POA: Diagnosis not present

## 2022-06-02 DIAGNOSIS — Z806 Family history of leukemia: Secondary | ICD-10-CM | POA: Diagnosis not present

## 2022-06-02 DIAGNOSIS — Z7902 Long term (current) use of antithrombotics/antiplatelets: Secondary | ICD-10-CM | POA: Diagnosis not present

## 2022-06-02 DIAGNOSIS — Z9889 Other specified postprocedural states: Secondary | ICD-10-CM | POA: Diagnosis not present

## 2022-06-02 DIAGNOSIS — Z7982 Long term (current) use of aspirin: Secondary | ICD-10-CM | POA: Diagnosis not present

## 2022-06-02 DIAGNOSIS — F17200 Nicotine dependence, unspecified, uncomplicated: Secondary | ICD-10-CM | POA: Diagnosis not present

## 2022-06-02 DIAGNOSIS — Z8042 Family history of malignant neoplasm of prostate: Secondary | ICD-10-CM | POA: Diagnosis not present

## 2022-06-02 DIAGNOSIS — I70213 Atherosclerosis of native arteries of extremities with intermittent claudication, bilateral legs: Secondary | ICD-10-CM | POA: Insufficient documentation

## 2022-06-02 DIAGNOSIS — T81718A Complication of other artery following a procedure, not elsewhere classified, initial encounter: Secondary | ICD-10-CM | POA: Diagnosis not present

## 2022-06-02 DIAGNOSIS — I10 Essential (primary) hypertension: Secondary | ICD-10-CM | POA: Insufficient documentation

## 2022-06-02 DIAGNOSIS — M329 Systemic lupus erythematosus, unspecified: Secondary | ICD-10-CM | POA: Insufficient documentation

## 2022-06-02 DIAGNOSIS — Z955 Presence of coronary angioplasty implant and graft: Secondary | ICD-10-CM | POA: Insufficient documentation

## 2022-06-02 DIAGNOSIS — Z9862 Peripheral vascular angioplasty status: Secondary | ICD-10-CM | POA: Diagnosis not present

## 2022-06-02 DIAGNOSIS — E782 Mixed hyperlipidemia: Secondary | ICD-10-CM | POA: Diagnosis not present

## 2022-06-02 DIAGNOSIS — Z86711 Personal history of pulmonary embolism: Secondary | ICD-10-CM | POA: Diagnosis not present

## 2022-06-02 DIAGNOSIS — Z7901 Long term (current) use of anticoagulants: Secondary | ICD-10-CM | POA: Insufficient documentation

## 2022-06-02 DIAGNOSIS — I724 Aneurysm of artery of lower extremity: Secondary | ICD-10-CM | POA: Diagnosis not present

## 2022-06-02 DIAGNOSIS — S75002A Unspecified injury of femoral artery, left leg, initial encounter: Secondary | ICD-10-CM | POA: Diagnosis not present

## 2022-06-02 DIAGNOSIS — G8918 Other acute postprocedural pain: Secondary | ICD-10-CM | POA: Diagnosis not present

## 2022-06-02 DIAGNOSIS — I70211 Atherosclerosis of native arteries of extremities with intermittent claudication, right leg: Secondary | ICD-10-CM

## 2022-06-02 DIAGNOSIS — Y713 Surgical instruments, materials and cardiovascular devices (including sutures) associated with adverse incidents: Secondary | ICD-10-CM | POA: Diagnosis present

## 2022-06-02 DIAGNOSIS — D6851 Activated protein C resistance: Secondary | ICD-10-CM | POA: Diagnosis not present

## 2022-06-02 DIAGNOSIS — D649 Anemia, unspecified: Secondary | ICD-10-CM | POA: Diagnosis not present

## 2022-06-02 DIAGNOSIS — I252 Old myocardial infarction: Secondary | ICD-10-CM | POA: Diagnosis not present

## 2022-06-02 DIAGNOSIS — E781 Pure hyperglyceridemia: Secondary | ICD-10-CM | POA: Diagnosis not present

## 2022-06-02 DIAGNOSIS — E876 Hypokalemia: Secondary | ICD-10-CM | POA: Diagnosis not present

## 2022-06-02 DIAGNOSIS — D6862 Lupus anticoagulant syndrome: Secondary | ICD-10-CM | POA: Diagnosis not present

## 2022-06-02 DIAGNOSIS — T81719A Complication of unspecified artery following a procedure, not elsewhere classified, initial encounter: Secondary | ICD-10-CM | POA: Diagnosis not present

## 2022-06-02 DIAGNOSIS — Z79899 Other long term (current) drug therapy: Secondary | ICD-10-CM | POA: Diagnosis not present

## 2022-06-02 DIAGNOSIS — D62 Acute posthemorrhagic anemia: Secondary | ICD-10-CM | POA: Diagnosis not present

## 2022-06-02 DIAGNOSIS — Y838 Other surgical procedures as the cause of abnormal reaction of the patient, or of later complication, without mention of misadventure at the time of the procedure: Secondary | ICD-10-CM | POA: Diagnosis present

## 2022-06-02 DIAGNOSIS — T82838A Hemorrhage of vascular prosthetic devices, implants and grafts, initial encounter: Secondary | ICD-10-CM | POA: Diagnosis not present

## 2022-06-02 DIAGNOSIS — Z888 Allergy status to other drugs, medicaments and biological substances status: Secondary | ICD-10-CM | POA: Diagnosis not present

## 2022-06-02 DIAGNOSIS — Z1152 Encounter for screening for COVID-19: Secondary | ICD-10-CM | POA: Diagnosis not present

## 2022-06-02 DIAGNOSIS — R509 Fever, unspecified: Secondary | ICD-10-CM | POA: Diagnosis not present

## 2022-06-02 HISTORY — PX: PERIPHERAL VASCULAR ATHERECTOMY: CATH118256

## 2022-06-02 HISTORY — PX: PERIPHERAL VASCULAR BALLOON ANGIOPLASTY: CATH118281

## 2022-06-02 HISTORY — PX: ABDOMINAL AORTOGRAM W/LOWER EXTREMITY: CATH118223

## 2022-06-02 LAB — POCT ACTIVATED CLOTTING TIME
Activated Clotting Time: 197 seconds
Activated Clotting Time: 221 seconds
Activated Clotting Time: 245 seconds
Activated Clotting Time: 263 seconds
Activated Clotting Time: 263 seconds
Activated Clotting Time: 269 seconds
Activated Clotting Time: 281 seconds
Activated Clotting Time: 287 seconds
Activated Clotting Time: 299 seconds
Activated Clotting Time: 299 seconds
Activated Clotting Time: 215 seconds
Activated Clotting Time: 227 seconds
Activated Clotting Time: 179 seconds

## 2022-06-02 SURGERY — ABDOMINAL AORTOGRAM W/LOWER EXTREMITY
Anesthesia: LOCAL

## 2022-06-02 MED ORDER — HEPARIN SODIUM (PORCINE) 1000 UNIT/ML IJ SOLN
INTRAMUSCULAR | Status: AC
  Filled 2022-06-02: qty 10

## 2022-06-02 MED ORDER — MIDAZOLAM HCL 2 MG/2ML IJ SOLN
INTRAMUSCULAR | Status: AC
  Filled 2022-06-02: qty 2

## 2022-06-02 MED ORDER — HEPARIN SODIUM (PORCINE) 1000 UNIT/ML IJ SOLN
INTRAMUSCULAR | Status: DC | PRN
  Administered 2022-06-02: 5000 [IU] via INTRAVENOUS
  Administered 2022-06-02: 7000 [IU] via INTRAVENOUS
  Administered 2022-06-02: 5000 [IU] via INTRAVENOUS
  Administered 2022-06-02: 2000 [IU] via INTRAVENOUS
  Administered 2022-06-02: 5000 [IU] via INTRAVENOUS

## 2022-06-02 MED ORDER — LABETALOL HCL 5 MG/ML IV SOLN: 10.0000 mg | INTRAVENOUS | Status: DC | PRN

## 2022-06-02 MED ORDER — SODIUM CHLORIDE 0.9 % WEIGHT BASED INFUSION
3.0000 mL/kg/h | INTRAVENOUS | Status: DC
  Administered 2022-06-02: 3 mL/kg/h via INTRAVENOUS

## 2022-06-02 MED ORDER — CLOPIDOGREL BISULFATE 300 MG PO TABS
ORAL_TABLET | ORAL | Status: AC
  Filled 2022-06-02: qty 1

## 2022-06-02 MED ORDER — ACETAMINOPHEN 325 MG PO TABS: 650.0000 mg | ORAL_TABLET | ORAL | Status: DC | PRN

## 2022-06-02 MED ORDER — CLOPIDOGREL BISULFATE 300 MG PO TABS
ORAL_TABLET | ORAL | Status: DC | PRN
  Administered 2022-06-02: 300 mg via ORAL

## 2022-06-02 MED ORDER — ONDANSETRON HCL 4 MG/2ML IJ SOLN
INTRAMUSCULAR | Status: AC
  Filled 2022-06-02: qty 2

## 2022-06-02 MED ORDER — LIDOCAINE HCL (PF) 1 % IJ SOLN
INTRAMUSCULAR | Status: DC | PRN
  Administered 2022-06-02: 20 mL

## 2022-06-02 MED ORDER — HEPARIN (PORCINE) IN NACL 1000-0.9 UT/500ML-% IV SOLN
INTRAVENOUS | Status: AC
  Filled 2022-06-02: qty 1000

## 2022-06-02 MED ORDER — SODIUM CHLORIDE 0.9% FLUSH
3.0000 mL | Freq: Two times a day (BID) | INTRAVENOUS | Status: DC
  Administered 2022-06-02 – 2022-06-03 (×2): 3 mL via INTRAVENOUS

## 2022-06-02 MED ORDER — ASPIRIN 81 MG PO CHEW: 81.0000 mg | CHEWABLE_TABLET | ORAL | Status: DC

## 2022-06-02 MED ORDER — HYDRALAZINE HCL 20 MG/ML IJ SOLN: 5.0000 mg | INTRAMUSCULAR | Status: DC | PRN

## 2022-06-02 MED ORDER — AMLODIPINE BESYLATE 5 MG PO TABS
5.0000 mg | ORAL_TABLET | Freq: Two times a day (BID) | ORAL | Status: DC
  Administered 2022-06-02 – 2022-06-03 (×2): 5 mg via ORAL
  Filled 2022-06-02 (×2): qty 1

## 2022-06-02 MED ORDER — SODIUM CHLORIDE 0.9 % IV SOLN: 250.0000 mL | INTRAVENOUS | Status: DC | PRN

## 2022-06-02 MED ORDER — MIDAZOLAM HCL 2 MG/2ML IJ SOLN
INTRAMUSCULAR | Status: DC | PRN
  Administered 2022-06-02 (×3): 1 mg via INTRAVENOUS

## 2022-06-02 MED ORDER — FENTANYL CITRATE (PF) 100 MCG/2ML IJ SOLN
INTRAMUSCULAR | Status: AC
  Filled 2022-06-02: qty 2

## 2022-06-02 MED ORDER — SODIUM CHLORIDE 0.9 % IV SOLN: INTRAVENOUS | Status: AC

## 2022-06-02 MED ORDER — ASPIRIN 81 MG PO TBEC
81.0000 mg | DELAYED_RELEASE_TABLET | Freq: Every day | ORAL | Status: DC
  Administered 2022-06-03: 81 mg via ORAL
  Filled 2022-06-02: qty 1

## 2022-06-02 MED ORDER — FENTANYL CITRATE (PF) 100 MCG/2ML IJ SOLN
INTRAMUSCULAR | Status: DC | PRN
  Administered 2022-06-02 (×3): 25 ug via INTRAVENOUS
  Administered 2022-06-02 (×2): 50 ug

## 2022-06-02 MED ORDER — SODIUM CHLORIDE 0.9 % WEIGHT BASED INFUSION: 1.0000 mL/kg/h | INTRAVENOUS | Status: DC

## 2022-06-02 MED ORDER — HEPARIN (PORCINE) IN NACL 1000-0.9 UT/500ML-% IV SOLN
INTRAVENOUS | Status: DC | PRN
  Administered 2022-06-02 (×2): 500 mL

## 2022-06-02 MED ORDER — ONDANSETRON HCL 4 MG/2ML IJ SOLN
4.0000 mg | Freq: Four times a day (QID) | INTRAMUSCULAR | Status: DC | PRN
  Administered 2022-06-02: 4 mg via INTRAVENOUS

## 2022-06-02 MED ORDER — IODIXANOL 320 MG/ML IV SOLN
INTRAVENOUS | Status: DC | PRN
  Administered 2022-06-02: 170 mL

## 2022-06-02 MED ORDER — LISINOPRIL 20 MG PO TABS
40.0000 mg | ORAL_TABLET | Freq: Every day | ORAL | Status: DC
  Administered 2022-06-03: 40 mg via ORAL
  Filled 2022-06-02: qty 2

## 2022-06-02 MED ORDER — CLOPIDOGREL BISULFATE 75 MG PO TABS
75.0000 mg | ORAL_TABLET | Freq: Every day | ORAL | Status: DC
  Administered 2022-06-03: 75 mg via ORAL
  Filled 2022-06-02: qty 1

## 2022-06-02 MED ORDER — SODIUM CHLORIDE 0.9% FLUSH: 3.0000 mL | INTRAVENOUS | Status: DC | PRN

## 2022-06-02 MED ORDER — CLOPIDOGREL BISULFATE 75 MG PO TABS: 75.0000 mg | ORAL_TABLET | Freq: Every day | ORAL | Status: DC

## 2022-06-02 MED ORDER — CARVEDILOL 3.125 MG PO TABS
3.1250 mg | ORAL_TABLET | Freq: Two times a day (BID) | ORAL | Status: DC
  Administered 2022-06-02 – 2022-06-03 (×2): 3.125 mg via ORAL
  Filled 2022-06-02 (×2): qty 1

## 2022-06-02 MED ORDER — HYDRALAZINE HCL 20 MG/ML IJ SOLN
INTRAMUSCULAR | Status: AC
  Filled 2022-06-02: qty 1

## 2022-06-02 MED ORDER — HYDRALAZINE HCL 20 MG/ML IJ SOLN
INTRAMUSCULAR | Status: DC | PRN
  Administered 2022-06-02: 5 mg via INTRAVENOUS

## 2022-06-02 MED ORDER — HEPARIN (PORCINE) 25000 UT/250ML-% IV SOLN
1200.0000 [IU]/h | INTRAVENOUS | Status: AC
  Administered 2022-06-02: 1100 [IU]/h via INTRAVENOUS
  Filled 2022-06-02: qty 250

## 2022-06-02 MED ORDER — LIDOCAINE HCL (PF) 1 % IJ SOLN
INTRAMUSCULAR | Status: AC
  Filled 2022-06-02: qty 30

## 2022-06-02 SURGICAL SUPPLY — 25 items
BALLN COYOTE ES OTW 2.5X40X145 (BALLOONS) ×2
BALLN IN.PACT DCB 4X60 (BALLOONS) ×2
BALLOON CYTE ES OTW 2.5X40X145 (BALLOONS) IMPLANT
CATH ANGIO 5F PIGTAIL 65CM (CATHETERS) IMPLANT
CATH HAWKONE LS STANDARD TIP (CATHETERS) ×2
CATH HAWKONE LS STD TIP (CATHETERS) IMPLANT
CATH VIANCE CROSS STAND 150CM (MICROCATHETER) ×2
CATH VIANCE CROSS STD 150CM (MICROCATHETER) IMPLANT
DCB IN.PACT 4X60 (BALLOONS) IMPLANT
DEVICE SPIDERFX EMB PROT 5MM (WIRE) IMPLANT
KIT ENCORE 26 ADVANTAGE (KITS) IMPLANT
KIT MICROPUNCTURE NIT STIFF (SHEATH) IMPLANT
KIT PV (KITS) ×2 IMPLANT
SHEATH HIGHFLEX ANSEL 7FR 55CM (SHEATH) IMPLANT
SHEATH PINNACLE 5F 10CM (SHEATH) IMPLANT
SHEATH PINNACLE 7F 10CM (SHEATH) IMPLANT
SHEATH PROBE COVER 6X72 (BAG) IMPLANT
STOPCOCK MORSE 400PSI 3WAY (MISCELLANEOUS) IMPLANT
SYR MEDRAD MARK 7 150ML (SYRINGE) ×2 IMPLANT
TAPE SHOOT N SEE (TAPE) IMPLANT
TRANSDUCER W/STOPCOCK (MISCELLANEOUS) ×2 IMPLANT
TRAY PV CATH (CUSTOM PROCEDURE TRAY) ×2 IMPLANT
TUBING CIL FLEX 10 FLL-RA (TUBING) IMPLANT
WIRE HITORQ VERSACORE ST 145CM (WIRE) IMPLANT
WIRE SHEPHERD 6G .014 (WIRE) IMPLANT

## 2022-06-02 NOTE — Progress Notes (Addendum)
Site area: Left groin a 7 french arterial sheath was removed by [facility]   Site Prior to Removal:  Level 0  Pressure Applied For 60 MINUTES    Bedrest Beginning at 1850  Manual:   Yes.    Patient Status During Pull:  stable  Post Pull Groin Site:  Level 0  Post Pull Instructions Given:  Yes.    Post Pull Pulses Present:  Yes.    Dressing Applied:  Yes.    Comments:

## 2022-06-02 NOTE — Progress Notes (Addendum)
Spoke with Dr. Gwenlyn Found, patient has high risk for coagulation. Plan to resume lovenox bridging tonight. Resume Xarelto tomorrow. Will do triple therapy for 1 month, then drop ASA after that.   AddendumLattie Haw, our clinical pharmacist spoke with Dr. Gwenlyn Found plan to do IV heparin 4 hours post sheath pull and transition to Xarelto tomorrow.

## 2022-06-03 ENCOUNTER — Encounter (HOSPITAL_COMMUNITY): Payer: Self-pay | Admitting: Cardiovascular Disease

## 2022-06-03 DIAGNOSIS — E782 Mixed hyperlipidemia: Secondary | ICD-10-CM

## 2022-06-03 DIAGNOSIS — I739 Peripheral vascular disease, unspecified: Secondary | ICD-10-CM | POA: Diagnosis not present

## 2022-06-03 DIAGNOSIS — I2510 Atherosclerotic heart disease of native coronary artery without angina pectoris: Secondary | ICD-10-CM

## 2022-06-03 DIAGNOSIS — D6851 Activated protein C resistance: Secondary | ICD-10-CM | POA: Diagnosis not present

## 2022-06-03 DIAGNOSIS — Z9862 Peripheral vascular angioplasty status: Secondary | ICD-10-CM | POA: Diagnosis not present

## 2022-06-03 LAB — CBC
HCT: 33 % — ABNORMAL LOW (ref 36.0–46.0)
Hemoglobin: 11.1 g/dL — ABNORMAL LOW (ref 12.0–15.0)
MCH: 29.8 pg (ref 26.0–34.0)
MCHC: 33.6 g/dL (ref 30.0–36.0)
MCV: 88.5 fL (ref 80.0–100.0)
Platelets: 381 10*3/uL (ref 150–400)
RBC: 3.73 MIL/uL — ABNORMAL LOW (ref 3.87–5.11)
RDW: 12.8 % (ref 11.5–15.5)
WBC: 7.5 10*3/uL (ref 4.0–10.5)
nRBC: 0 % (ref 0.0–0.2)

## 2022-06-03 LAB — BASIC METABOLIC PANEL
Anion gap: 8 (ref 5–15)
BUN: 8 mg/dL (ref 8–23)
CO2: 22 mmol/L (ref 22–32)
Calcium: 8.4 mg/dL — ABNORMAL LOW (ref 8.9–10.3)
Chloride: 108 mmol/L (ref 98–111)
Creatinine, Ser: 0.73 mg/dL (ref 0.44–1.00)
GFR, Estimated: >60 mL/min (ref >60)
Glucose, Bld: 98 mg/dL (ref 70–99)
Potassium: 3.6 mmol/L (ref 3.5–5.1)
Sodium: 138 mmol/L (ref 135–145)

## 2022-06-03 LAB — APTT: aPTT: 61 seconds — ABNORMAL HIGH (ref 24–36)

## 2022-06-03 LAB — LIPID PANEL
Cholesterol: 207 mg/dL — ABNORMAL HIGH (ref 0–200)
HDL: 26 mg/dL — ABNORMAL LOW (ref >40)
LDL Cholesterol: 116 mg/dL — ABNORMAL HIGH (ref 0–99)
Total CHOL/HDL Ratio: 8 RATIO
Triglycerides: 327 mg/dL — ABNORMAL HIGH (ref <150)
VLDL: 65 mg/dL — ABNORMAL HIGH (ref 0–40)

## 2022-06-03 LAB — HEPARIN LEVEL (UNFRACTIONATED): Heparin Unfractionated: 0.31 IU/mL (ref 0.30–0.70)

## 2022-06-03 MED ORDER — RIVAROXABAN 20 MG PO TABS
20.0000 mg | ORAL_TABLET | Freq: Every day | ORAL | Status: DC
  Administered 2022-06-03: 20 mg via ORAL
  Filled 2022-06-03: qty 1

## 2022-06-03 MED ORDER — ASPIRIN 81 MG PO TBEC: 81.0000 mg | DELAYED_RELEASE_TABLET | Freq: Every day | ORAL | 0 refills | Status: DC

## 2022-06-03 MED ORDER — RIVAROXABAN 20 MG PO TABS: 20.0000 mg | ORAL_TABLET | Freq: Every day | ORAL | 0 refills | Status: DC

## 2022-06-03 NOTE — Discharge Summary (Cosign Needed)
Discharge Summary    Patient ID: [name] [name] MRN: [id number]; DOB: 06-Jun-1956  Admit date: 06/02/2022 Discharge date: 06/03/2022  PCP:  Billie Ruddy, MD   [facility] Cardiologist:  Glenetta Hew, MD  Cardiology APP:  Almyra Deforest, [place]     Discharge Diagnoses    Principal Problem:   Claudication in peripheral vascular disease [facility])   Diagnostic Studies/Procedures    PV angiogram: 06/02/22  Angiographic Data:    1: Abdominal aorta-mild atherosclerosis 2: Left lower extremity-moderately long segment mid left SFA CTO with three-vessel runoff 3: Right lower extremity-short CTO right above-the-knee popliteal artery with two-vessel runoff.  The anterior tibial is occluded in its distal portion  Final Impression: Successful Hawk 1 directional atherectomy followed by [facility] using spider distal protection of a short segment right above-the-knee popliteal artery CTO for claudication.  Patient does have lupus anticoagulant.  She was difficult to anticoagulate.  We will restart Lovenox bridge tonight and Xarelto tomorrow.  She will need to be on "triple therapy" for 1 month after which we will discontinue the aspirin.  The sheath will be removed once ACT falls below 170 pressure held.  She will be hydrated overnight and discharged home in the morning.  We will obtain lower extremity arterial Doppler studies in unrefined office next week and I will see her back the week after.       Quay Burow. MD, [facility] 06/02/2022 2:50 PM _____________   History of Present Illness     [name] [name] is a 66 y.o. female with PMH of HTN, CAD s/p LAD/Lcx PCI, lupus, history of DVT/PE on chronic anticoagulation.  She is followed by Dr. Ellyn Hack as an outpatient and was referred to Dr. Gwenlyn Found for bilateral calf claudication.  Reports she was on Coumadin for some time and then transition to Xarelto.  She was on triple therapy when she underwent intervention to her LAD and  circumflex with aspirin, Plavix and Xarelto.  Reportedly complained of bilateral calf claudication over the past year prior to visit.  She had lower extremity Doppler 02/2022 revealing right ABI of 0.57 and left of 0.52.  High-grade lesions in distal right SFA and popliteal artery as well as occluded left SFA.  She was seen in the office with Dr. Alvester Chou on 10/6 with her complaints.  It was recommended that she undergo outpatient PV angiography.  She was bridged with Lovenox while holding her Xarelto prior to procedure.  Hospital Course     Presented on 10/16 and underwent successful directional atherectomy followed by drug-coated balloon using spider distal protection of short segment right above-the-knee popliteal artery CTO for claudication.  She was treated with IV heparin postprocedure given the need for sheath pull.  Plan will be for triple therapy with aspirin, Plavix and Xarelto for 1 month at which time aspirin will be DC'd.  She was observed overnight without complication.  Set up for outpatient repeat lower extremity Dopplers as well as follow-up.   General: Well developed, well nourished, female appearing in no acute distress. Head: Normocephalic, atraumatic.  Neck: Supple without bruits, JVD. Lungs:  Resp regular and unlabored, CTA. Heart: RRR, S1, S2, no S3, S4, or murmur; no rub. Abdomen: Soft, non-tender, non-distended with normoactive bowel sounds. No hepatomegaly. No rebound/guarding. No obvious abdominal masses. Extremities: No clubbing, cyanosis, edema. Distal pedal pulses are 2+ bilaterally. Left femoral cath site stable without bruising or hematoma Neuro: Alert and oriented X 3. Moves all extremities spontaneously. Psych: Normal affect.  Patient was seen by Dr. Ellyn Hack and deemed stable for discharge home. Follow up in the office arranged.   Did the patient have an acute coronary syndrome (MI, NSTEMI, STEMI, etc) this admission?:  No                               Did the patient  have a percutaneous coronary intervention (stent / angioplasty)?:  No.    _____________  Discharge Vitals Blood pressure (!) 151/64, pulse 86, temperature 98.2 F (36.8 C), temperature source Oral, resp. rate 17, height 5' 4.5" (1.638 m), weight 68 kg, SpO2 94 %.  Filed Weights   06/02/22 1006  Weight: 68 kg    Labs & Radiologic Studies    CBC Recent Labs    06/03/22 0519  WBC 7.5  HGB 11.1*  HCT 33.0*  MCV 88.5  PLT 364   Basic Metabolic Panel Recent Labs    06/03/22 0519  NA 138  K 3.6  CL 108  CO2 22  GLUCOSE 98  BUN 8  CREATININE 0.73  CALCIUM 8.4*   Liver Function Tests No results for input(s): "AST", "ALT", "ALKPHOS", "BILITOT", "PROT", "ALBUMIN" in the last 72 hours. No results for input(s): "LIPASE", "AMYLASE" in the last 72 hours. High Sensitivity Troponin:   No results for input(s): "TROPONINIHS" in the last 720 hours.  BNP Invalid input(s): "POCBNP" D-Dimer No results for input(s): "DDIMER" in the last 72 hours. Hemoglobin A1C No results for input(s): "HGBA1C" in the last 72 hours. Fasting Lipid Panel Recent Labs    06/03/22 0519  CHOL 207*  HDL 26*  LDLCALC 116*  TRIG 327*  CHOLHDL 8.0   Thyroid Function Tests No results for input(s): "TSH", "T4TOTAL", "T3FREE", "THYROIDAB" in the last 72 hours.  Invalid input(s): "FREET3" _____________  PERIPHERAL VASCULAR CATHETERIZATION  Result Date: 06/02/2022 Images from the original result were not included.  [id number] LOCATION:  FACILITY: [facility] PHYSICIAN: Quay Burow, M.D. Aug 24, 1955 DATE OF PROCEDURE:  06/02/2022 DATE OF DISCHARGE: PV Angiogram/Intervention History obtained from chart review.[name] [name] is a 66 y.o.   mild to moderately overweight married Caucasian female mother of 63, grandmother of 2 grandchildren referred by Almyra Deforest, PA-C for peripheral vascular valuation.  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  I last saw her  in the office 03/21/2022.  Her risk factors include ongoing tobacco abuse 1/2 pack/day, treated hypertension and hyperlipidemia.  She does have CAD status post LAD and circumflex intervention back in January 2020.  She is never had a stroke.  She does have lupus anticoagulant with extensive lower extremity DVT and pulmonary emboli status post endovascular therapy with stenting of her lower extremity venous vasculature as well as placement of an IVC filter.  She was on Coumadin for some time and transition to Xarelto.  When she was on triple therapy for her LAD and circumflex stent she had GI bleeding.  Aspirin was discontinued.  She was not tolerant to Plavix.  She does complain of bilateral calf claudication over the last year with Dopplers performed 02/27/2022 revealing a right ABI of 0.57, left of 0.52.  She had high-grade lesions in her distal right SFA and popliteal artery and an occluded left SFA.  Since I saw her 2 months ago she has become more symptomatic on the right side.  She has dependent rubor, resting foot pain and right calf claudication that prevents  her from exercising.  She wishes to proceed with outpatient diagnostic peripheral angiography and potential endovascular therapy.  We will have to be mindful of her clotting issues with her lupus anticoagulant being on Xarelto.  I will have our Pharm.D.'s orchestrate a Lovenox bridge. Pre Procedure Diagnosis: Peripheral arterial disease Post Procedure Diagnosis: Peripheral arterial disease Operators: Dr. Quay Burow Procedures Performed:  1.  Ultrasound-guided left common femoral access  2.  Abdominal aortogram/bilateral iliac angiogram/bifemoral runoff  3.  Contralateral access (second-order catheter placement)  4.  Hawk 1 directional atherectomy/DCB right above-the-knee popliteal artery             5.  Spider distal protection device right below the knee popliteal artery PROCEDURE DESCRIPTION: The patient was brought to the second floor [facility]  Cardiac cath lab in the the postabsorptive state. She was premedicated with IV Versed and fentanyl. Her left groin was prepped and shaved in usual sterile fashion. Xylocaine 1% was used for local anesthesia. A 5 French sheath was inserted into the left common femoral artery using standard Seldinger technique.  Ultrasound was used to identify the left common femoral artery and guide access.  A digital image was captured and placed the patient's chart.  A 5 French pigtail catheter was placed in distal abdominal aorta.  Distal abdominal aortography, bilateral iliac angiography with bifemoral runoff was performed using bolus chase, digital subtraction and step table technique.  Omnipaque dye was used for the entirety of the case (170 cc contrast total to patient).  Retrograde aortic pressures monitored during the case.  Angiographic Data: 1: Abdominal aorta-mild atherosclerosis 2: Left lower extremity-moderately long segment mid left SFA CTO with three-vessel runoff 3: Right lower extremity-short CTO right above-the-knee popliteal artery with two-vessel runoff.  The anterior tibial is occluded in its distal portion   [name] [name] has CTOs bilaterally, on the right just above the knee in the above-knee popliteal artery and a left mid SFA.  She is more symptomatic on the right.  We will proceed with right above-the-knee popliteal directional atherectomy followed by drug-coated balloon angioplasty using spider distal protection. Procedure Description: Contralateral access was obtained and a 7 French 55 cm multipurpose Ansell sheath was then advanced across the iliac bifurcation into the right common femoral artery (second-order cath placement).  The patient received 24,000's of heparin with an ACT of 299 at the maximum.  She was already on aspirin and clopidogrel.  She had stopped her Xarelto on 05/31/2022 and was placed on a Lovenox bridge. I was able to cross the CTO with a Viance CTO catheter and a 0.14/6 g Shepperd  wire.  I then performed PTA of the right above-the-knee popliteal CTO with a 2.5 mm x 40 mm balloon.  Following this I successfully placed a 5 mm spider distal protection device in the right below the knee popliteal artery.  I then performed directional atherectomy with an LS directional atherectomy device removing a significant amount of atherosclerotic plaque.  I then performed DCB using a 4 mm x 60 mm long impact Admiral drug-coated balloon at 4 atm for 3 minutes resulting in reduction of a total occlusion to 0% residual with excellent flow.  The trifurcation remained intact.  There was a very small nonflow limiting flap within the balloon segment. Final Impression: Successful Hawk 1 directional atherectomy followed by [facility] using spider distal protection of a short segment right above-the-knee popliteal artery CTO for claudication.  Patient does have lupus anticoagulant.  She was difficult to anticoagulate.  We will restart Lovenox bridge tonight and Xarelto tomorrow.  She will need to be on "triple therapy" for 1 month after which we will discontinue the aspirin.  The sheath will be removed once ACT falls below 170 pressure held.  She will be hydrated overnight and discharged home in the morning.  We will obtain lower extremity arterial Doppler studies in unrefined office next week and I will see her back the week after. Quay Burow. MD, [facility] 06/02/2022 2:50 PM    Disposition   Pt is being discharged home today in good condition.  Follow-up Plans & Appointments     Follow-up [facility] Follow up on 06/12/2022.   Specialty: Cardiology Why: at 10am for your follow up dopplers Contact information: [address] [place] [id number] [place] [id number] [phone]        Lorretta Harp, MD Follow up on 07/04/2022.   Specialties: Cardiology, Radiology Why: at 10:30am for your follow up  appt Contact information: [address] [place] [address] [phone]                Discharge Instructions     Call MD for:  difficulty breathing, headache or visual disturbances   Complete by: As directed    Call MD for:  persistant dizziness or light-headedness   Complete by: As directed    Call MD for:  redness, tenderness, or signs of infection (pain, swelling, redness, odor or green/yellow discharge around incision site)   Complete by: As directed    Diet - low sodium heart healthy   Complete by: As directed    Discharge instructions   Complete by: As directed    Groin Site Care Refer to this sheet in the next few weeks. These instructions provide you with information on caring for yourself after your procedure. Your caregiver may also give you more specific instructions. Your treatment has been planned according to current medical practices, but problems sometimes occur. Call your caregiver if you have any problems or questions after your procedure. HOME CARE INSTRUCTIONS You may shower 24 hours after the procedure. Remove the bandage (dressing) and gently wash the site with plain soap and water. Gently pat the site dry.  Do not apply powder or lotion to the site.  Do not sit in a bathtub, swimming pool, or whirlpool for 5 to 7 days.  No bending, squatting, or lifting anything over 10 pounds (4.5 kg) as directed by your caregiver.  Inspect the site at least twice daily.  Do not drive home if you are discharged the same day of the procedure. Have someone else drive you.  You may drive 24 hours after the procedure unless otherwise instructed by your caregiver.  What to expect: Any bruising will usually fade within 1 to 2 weeks.  Blood that collects in the tissue (hematoma) may be painful to the touch. It should usually decrease in size and tenderness within 1 to 2 weeks.  SEEK IMMEDIATE MEDICAL CARE IF: You have unusual pain at the groin site or down  the affected leg.  You have redness, warmth, swelling, or pain at the groin site.  You have drainage (other than a small amount of blood on the dressing).  You have chills.  You have a fever or persistent symptoms for more than 72 hours.  You have a fever and your symptoms suddenly get worse.  Your leg becomes pale, cool, tingly, or numb.  You have heavy bleeding from the site. Hold pressure on the site. .   Please take ASA, plavix and Xarelto for one month, then stop ASA and continue plavix and Xarelto   Increase activity slowly   Complete by: As directed         Discharge Medications   Allergies as of 06/03/2022       Reactions   Prednisone Other (See Comments)   No energy "stalls out".    Makes pt  Jittery.   Tramadol Other (See Comments)   Constipation,  Makes pt  Jittery.   Crestor [rosuvastatin Calcium]    Feels fatigued, RE  CHALLENGE - UNABLE TO USE 12/28/18   Hctz [hydrochlorothiazide]    Dizzy   Repatha [evolocumab] Other (See Comments)   Per patient never took this medication        Medication List     STOP taking these medications    enoxaparin 100 MG/ML injection Commonly known as: LOVENOX       TAKE these medications    acetaminophen 500 MG tablet Commonly known as: TYLENOL Take 500 mg by mouth daily as needed for mild pain or moderate pain.   acetaminophen 650 MG CR tablet Commonly known as: TYLENOL Take 650 mg by mouth daily as needed for pain.   amLODipine 5 MG tablet Commonly known as: NORVASC TAKE 1 TABLET BY MOUTH TWICE A DAY What changed: how much to take   aspirin EC 81 MG tablet Take 1 tablet (81 mg total) by mouth daily. Swallow whole. Start taking on: June 04, 2022   carvedilol 6.25 MG tablet Commonly known as: COREG Take 0.5 tablets (3.125 mg total) by mouth 2 (two) times daily with a meal.   clopidogrel 75 MG tablet Commonly known as: PLAVIX Take 1 tablet (75 mg total) by mouth daily.   fenofibrate 145 MG  tablet Commonly known as: TRICOR TAKE 1 TABLET (145 MG TOTAL) BY MOUTH DAILY. TAKE WITH A MEAL.   lisinopril 40 MG tablet Commonly known as: ZESTRIL TAKE 1 TABLET BY MOUTH EVERY DAY   nitroGLYCERIN 0.4 MG SL tablet Commonly known as: NITROSTAT Place 1 tablet (0.4 mg total) under the tongue every 5 (five) minutes as needed for up to 3 doses for chest pain.   rivaroxaban 20 MG Tabs tablet Commonly known as: Xarelto Take 1 tablet (20 mg total) by mouth daily with supper. Start taking on: June 04, 2022           Outstanding Labs/Studies   F/u dopplers  Duration of Discharge Encounter   Greater than 30 minutes including physician time.  Signed, Reino Bellis, NP 06/03/2022, 11:49 AM   ATTENDING ATTESTATION  I have seen, examined and evaluated the patient this morning on rounds along with Reino Bellis, NP-C .  After reviewing all the available data and chart, we discussed the patients laboratory, study & physical findings as well as symptoms in detail.  I agree with her findings, examination as well as impressions/recommendations detailed in the summary above.   Treatment options were as per our discussion.    Attending adjustments noted in italics.   [name] [name] is well-known to me.  She is here for evaluation management of her PAD by Dr. Gwenlyn Found.  She underwent atherectomy and PTA of lower extremity for claudication and resting limb ischemia.  She feels much better following revascularization.  She was a little leery of the combination of aspirin Plavix  and Xarelto, but I convinced her that she should try to go for at least 1 month with triple therapy with the knowledge that she will be stopping aspirin soon.  She has previously been on Plavix and Xarelto combination, but we would probably try to minimize how long she is on Plavix.  I will defer the duration to Dr. Gwenlyn Found but I suspect that by about 6 months out we will revert back to Xarelto alone.  She has multiple  other cardiac risk factors that are stable and can be managed in the outpatient setting.    Leonie Man, MD, MS Glenetta Hew, M.D., M.S. Interventional Cardiologist  [facility] [place]  Pager # [phone] Phone # [phone] [address] [place], [place] [address]

## 2022-06-03 NOTE — Progress Notes (Signed)
ANTICOAGULATION CONSULT NOTE - Follow Up Consult  Pharmacy Consult for heparin Indication:  lupus anticoagulant syndrome and h/o VTE  Labs: Recent Labs    06/03/22 0519  HGB 11.1*  HCT 33.0*  PLT 381  APTT 61*  HEPARINUNFRC 0.31  CREATININE 0.73    Assessment: 66yo female subtherapeutic on heparin with initial dosing s/p cath; no infusion issues or signs of bleeding per RN.  Goal of Therapy:  aPTT 66-102 seconds   Plan:  Will increase heparin infusion by 1-2 units/kg/hr to 1200 units/hr and check PTT in 6 hours.    Wynona Neat, PharmD, BCPS  06/03/2022,6:59 AM

## 2022-06-05 ENCOUNTER — Emergency Department (HOSPITAL_COMMUNITY): Admit: 2022-06-05 | Discharge: 2022-06-05 | Disposition: A | Discharge status: Home / Self Care | Payer: Medicare Other

## 2022-06-05 ENCOUNTER — Encounter (HOSPITAL_COMMUNITY): Payer: Self-pay | Admitting: Emergency Medicine

## 2022-06-05 ENCOUNTER — Encounter: Payer: Self-pay | Admitting: Cardiovascular Disease

## 2022-06-05 ENCOUNTER — Emergency Department (HOSPITAL_COMMUNITY): Payer: Medicare Other

## 2022-06-05 ENCOUNTER — Inpatient Hospital Stay (HOSPITAL_COMMUNITY)
Admission: EM | Admit: 2022-06-05 | Discharge: 2022-06-09 | DRG: 300 | Disposition: A | Discharge status: Home / Self Care | Payer: Medicare Other | Attending: Cardiovascular Disease | Admitting: Cardiovascular Disease

## 2022-06-05 DIAGNOSIS — I2510 Atherosclerotic heart disease of native coronary artery without angina pectoris: Secondary | ICD-10-CM | POA: Diagnosis present

## 2022-06-05 DIAGNOSIS — Z86711 Personal history of pulmonary embolism: Secondary | ICD-10-CM

## 2022-06-05 DIAGNOSIS — Z7982 Long term (current) use of aspirin: Secondary | ICD-10-CM

## 2022-06-05 DIAGNOSIS — Z888 Allergy status to other drugs, medicaments and biological substances status: Secondary | ICD-10-CM

## 2022-06-05 DIAGNOSIS — I70221 Atherosclerosis of native arteries of extremities with rest pain, right leg: Secondary | ICD-10-CM | POA: Diagnosis present

## 2022-06-05 DIAGNOSIS — I252 Old myocardial infarction: Secondary | ICD-10-CM

## 2022-06-05 DIAGNOSIS — Z7902 Long term (current) use of antithrombotics/antiplatelets: Secondary | ICD-10-CM

## 2022-06-05 DIAGNOSIS — I724 Aneurysm of artery of lower extremity: Secondary | ICD-10-CM | POA: Diagnosis present

## 2022-06-05 DIAGNOSIS — I97638 Postprocedural hematoma of a circulatory system organ or structure following other circulatory system procedure: Secondary | ICD-10-CM | POA: Diagnosis present

## 2022-06-05 DIAGNOSIS — E781 Pure hyperglyceridemia: Secondary | ICD-10-CM | POA: Diagnosis present

## 2022-06-05 DIAGNOSIS — I700 Atherosclerosis of aorta: Secondary | ICD-10-CM | POA: Diagnosis present

## 2022-06-05 DIAGNOSIS — Z1152 Encounter for screening for COVID-19: Secondary | ICD-10-CM

## 2022-06-05 DIAGNOSIS — Z86718 Personal history of other venous thrombosis and embolism: Secondary | ICD-10-CM

## 2022-06-05 DIAGNOSIS — Z955 Presence of coronary angioplasty implant and graft: Secondary | ICD-10-CM

## 2022-06-05 DIAGNOSIS — D6862 Lupus anticoagulant syndrome: Secondary | ICD-10-CM | POA: Diagnosis present

## 2022-06-05 DIAGNOSIS — Y713 Surgical instruments, materials and cardiovascular devices (including sutures) associated with adverse incidents: Secondary | ICD-10-CM | POA: Diagnosis present

## 2022-06-05 DIAGNOSIS — E876 Hypokalemia: Principal | ICD-10-CM

## 2022-06-05 DIAGNOSIS — Z8042 Family history of malignant neoplasm of prostate: Secondary | ICD-10-CM

## 2022-06-05 DIAGNOSIS — Z79899 Other long term (current) drug therapy: Secondary | ICD-10-CM

## 2022-06-05 DIAGNOSIS — Z806 Family history of leukemia: Secondary | ICD-10-CM

## 2022-06-05 DIAGNOSIS — I739 Peripheral vascular disease, unspecified: Secondary | ICD-10-CM

## 2022-06-05 DIAGNOSIS — Y838 Other surgical procedures as the cause of abnormal reaction of the patient, or of later complication, without mention of misadventure at the time of the procedure: Secondary | ICD-10-CM | POA: Diagnosis present

## 2022-06-05 DIAGNOSIS — Z7901 Long term (current) use of anticoagulants: Secondary | ICD-10-CM

## 2022-06-05 DIAGNOSIS — Z801 Family history of malignant neoplasm of trachea, bronchus and lung: Secondary | ICD-10-CM

## 2022-06-05 DIAGNOSIS — F17210 Nicotine dependence, cigarettes, uncomplicated: Secondary | ICD-10-CM | POA: Diagnosis present

## 2022-06-05 DIAGNOSIS — S8012XA Contusion of left lower leg, initial encounter: Secondary | ICD-10-CM

## 2022-06-05 DIAGNOSIS — T81719A Complication of unspecified artery following a procedure, not elsewhere classified, initial encounter: Secondary | ICD-10-CM | POA: Diagnosis not present

## 2022-06-05 DIAGNOSIS — Z823 Family history of stroke: Secondary | ICD-10-CM

## 2022-06-05 DIAGNOSIS — G8918 Other acute postprocedural pain: Secondary | ICD-10-CM | POA: Diagnosis present

## 2022-06-05 DIAGNOSIS — I10 Essential (primary) hypertension: Secondary | ICD-10-CM | POA: Diagnosis present

## 2022-06-05 DIAGNOSIS — D62 Acute posthemorrhagic anemia: Secondary | ICD-10-CM | POA: Diagnosis not present

## 2022-06-05 DIAGNOSIS — I729 Aneurysm of unspecified site: Secondary | ICD-10-CM

## 2022-06-05 DIAGNOSIS — R509 Fever, unspecified: Secondary | ICD-10-CM

## 2022-06-05 DIAGNOSIS — T81718A Complication of other artery following a procedure, not elsewhere classified, initial encounter: Principal | ICD-10-CM | POA: Diagnosis present

## 2022-06-05 LAB — CBC WITH DIFFERENTIAL/PLATELET
Abs Immature Granulocytes: 0.06 10*3/uL (ref 0.00–0.07)
Basophils Absolute: 0 10*3/uL (ref 0.0–0.1)
Basophils Relative: 0 %
Eosinophils Absolute: 0.2 10*3/uL (ref 0.0–0.5)
Eosinophils Relative: 1 %
HCT: 32.5 % — ABNORMAL LOW (ref 36.0–46.0)
Hemoglobin: 10.8 g/dL — ABNORMAL LOW (ref 12.0–15.0)
Immature Granulocytes: 1 %
Lymphocytes Relative: 17 %
Lymphs Abs: 2.1 10*3/uL (ref 0.7–4.0)
MCH: 29.9 pg (ref 26.0–34.0)
MCHC: 33.2 g/dL (ref 30.0–36.0)
MCV: 90 fL (ref 80.0–100.0)
Monocytes Absolute: 1 10*3/uL (ref 0.1–1.0)
Monocytes Relative: 8 %
Neutro Abs: 9.2 10*3/uL — ABNORMAL HIGH (ref 1.7–7.7)
Neutrophils Relative %: 73 %
Platelets: 414 10*3/uL — ABNORMAL HIGH (ref 150–400)
RBC: 3.61 MIL/uL — ABNORMAL LOW (ref 3.87–5.11)
RDW: 12.8 % (ref 11.5–15.5)
WBC: 12.5 10*3/uL — ABNORMAL HIGH (ref 4.0–10.5)
nRBC: 0 % (ref 0.0–0.2)

## 2022-06-05 LAB — COMPREHENSIVE METABOLIC PANEL
ALT: 17 U/L (ref 0–44)
AST: 19 U/L (ref 15–41)
Albumin: 3.4 g/dL — ABNORMAL LOW (ref 3.5–5.0)
Alkaline Phosphatase: 48 U/L (ref 38–126)
Anion gap: 13 (ref 5–15)
BUN: 6 mg/dL — ABNORMAL LOW (ref 8–23)
CO2: 22 mmol/L (ref 22–32)
Calcium: 9.5 mg/dL (ref 8.9–10.3)
Chloride: 102 mmol/L (ref 98–111)
Creatinine, Ser: 0.79 mg/dL (ref 0.44–1.00)
GFR, Estimated: >60 mL/min (ref >60)
Glucose, Bld: 120 mg/dL — ABNORMAL HIGH (ref 70–99)
Potassium: 3.2 mmol/L — ABNORMAL LOW (ref 3.5–5.1)
Sodium: 137 mmol/L (ref 135–145)
Total Bilirubin: 0.6 mg/dL (ref 0.3–1.2)
Total Protein: 6.4 g/dL — ABNORMAL LOW (ref 6.5–8.1)

## 2022-06-05 MED ORDER — HYDROMORPHONE HCL 1 MG/ML IJ SOLN
1.0000 mg | Freq: Once | INTRAMUSCULAR | Status: AC
  Administered 2022-06-05: 1 mg via INTRAVENOUS
  Filled 2022-06-05: qty 1

## 2022-06-05 MED ORDER — OXYCODONE-ACETAMINOPHEN 5-325 MG PO TABS
2.0000 | ORAL_TABLET | Freq: Once | ORAL | Status: AC
  Administered 2022-06-05: 2 via ORAL
  Filled 2022-06-05: qty 2

## 2022-06-05 MED ORDER — HYDROMORPHONE HCL 1 MG/ML IJ SOLN
0.5000 mg | Freq: Once | INTRAMUSCULAR | Status: AC
  Administered 2022-06-05: 0.5 mg via INTRAVENOUS
  Filled 2022-06-05: qty 1

## 2022-06-05 MED FILL — Fentanyl Citrate Preservative Free (PF) Inj 100 MCG/2ML: INTRAMUSCULAR | Qty: 2 | Status: AC

## 2022-06-05 NOTE — ED Provider Notes (Cosign Needed Addendum)
  Physical Exam  BP 133/74   Pulse 82   Temp 98.4 F (36.9 C) (Oral)   Resp 13   Ht 5' 4.5" (1.638 m)   Wt 68 kg   SpO2 99%   BMI 25.34 kg/m   Physical Exam Vitals and nursing note reviewed.  Constitutional:      Appearance: Normal appearance.  HENT:     Head: Normocephalic.     Mouth/Throat:     Mouth: Mucous membranes are moist.  Cardiovascular:     Rate and Rhythm: Normal rate.  Pulmonary:     Effort: Pulmonary effort is normal.  Abdominal:     General: Abdomen is flat.     Tenderness: There is abdominal tenderness.  Musculoskeletal:     Cervical back: Normal range of motion and neck supple.  Skin:    Findings: Bruising present.     Comments: Please see photos attached.   Neurological:     Mental Status: She is alert and oriented to person, place, and time.     Procedures  Procedures  ED Course / MDM    Medical Decision Making Amount and/or Complexity of Data Reviewed Labs: ordered. Radiology: ordered.  Risk Prescription drug management. Decision regarding hospitalization.   Patient care assumed from [facility] ER PA at shift change, please see her note for full HPI. Briefly, patient here s/p abdominal aortogram lower by Dr. Gwenlyn Found extremity POD4 with groin pain along with bruising and swelling.  Received Dilaudid 1 mg while in the ED.  Ultrasound negative. Plan is for patient to obtain CT Angio r/o pseudoaneurysm.CBC repeat with patients hemoglobin dropping now to 9.8.  1:15 AM Received called from Radiologist with CT Angio pelvis result showed: Pseudoaneurysm with active bleeding from the left common femoral  artery access site into a 6.9 cm left inguinal hematoma.   1:34 AM spoke to cardiology who recommended I contact vascular as patient is actively bleeding.  Call placed for vascular surgery.    Spoke to Dr. Unk Lightning who will be taking patient to the OR for repair.  Patient informed about needing intervention at this time.  She is hemodynamically stable  for further intervention. BP 133/74   Pulse 82   Temp 98.4 F (36.9 C) (Oral)   Resp 13   Ht 5' 4.5" (1.638 m)   Wt 68 kg   SpO2 99%   BMI 25.34 kg/m   Portions of this note were generated with Lobbyist. Dictation errors may occur despite best attempts at proofreading.         Janeece Fitting, PA-C 06/06/22 0219    Janeece Fitting, PA-C 06/06/22 0221    Fatima Blank, MD 06/07/22 [phone]

## 2022-06-05 NOTE — Consult Note (Signed)
Cardiology Consultation:   Patient ID: [name] [name] MRN: [id number]; DOB: 11-Dec-1955  Admit date: 06/05/2022 Date of Consult: 06/05/2022  Primary Care Provider: Billie Ruddy, MD Primary Cardiologist: Glenetta Hew, MD  Primary Electrophysiologist:  None    Patient Profile:   [name] [name] is a 66 y.o. female with a hx of HTN, CAD s/p LAD/Lcx PCI, lupus, history of DVT/PE on chronic anticoagulation who is being seen today for the evaluation of left groin hematoma at the request of emergency department.  History of Present Illness:   [name] [name] is a 66 yo F with HTN, CAD s/p LAD/Lcx PCI, lupus, history of DVT/PE on chronic anticoagulation who presents to the emergency department with 24 hours of worsening left groin hematoma.  She underwent left lower extremity SFA intervention with Dr. Gwenlyn Found on 06/02/2022.  Notably given her history of lupus anticoagulant, she was continued on aspirin, Plavix, and rivaroxaban.  Noted that she was doing well with the first 24 hours after intervention.  Notes that she had some discomfort in the leg and worsened throughout yesterday evening and into this morning.  Has not had any other symptoms and no pain below the thigh.  No fevers, chills, nausea, vomiting.  Past Medical History:  Diagnosis Date   Back pain    CAD S/P percutaneous coronary angioplasty 09/13/2018   09/13/2018 - Cath & Staged DES PCI on 09/14/2018: DES PCI: Cx99%&55% - Resolute Onyx DES 3.5 x 30 (3.6 mm), p-mLAD (prox of D1 almost to D2) - Resolute Onyx DES 3.0 x 22 (3.3 mm); DFR on pRCA 70% - 0.99, Not significant -> Rec Med Rx.   DVT (deep venous thrombosis) ([place])    Heart attack ([place]) 09/09/2018   Hypertension    Lupus anticoagulant syndrome (HCC)    NSTEMI (non-ST elevated myocardial infarction) ([place]) 09/12/2018   Initial presentation with chest pain was on 09/09/2018, recurrent pain on 1/26 2020 with positive troponins.  Cath showed three-vessel CAD -> declined CABG,  opted for two-vessel DES PCI (LAD and CX, negative DFR RCA)   Pulmonary embolism ([place])     Past Surgical History:  Procedure Laterality Date   ABDOMINAL AORTOGRAM W/LOWER EXTREMITY N/A 06/02/2022   Procedure: ABDOMINAL AORTOGRAM W/LOWER EXTREMITY;  Surgeon: Lorretta Harp, MD;  Location: [place] [facility];  Service: Cardiovascular;  Laterality: N/A;   BACK SURGERY     COLONOSCOPY  05/19/2012   Procedure: COLONOSCOPY;  Surgeon: Beryle Beams, MD;  Location: [place];  Service: Endoscopy;  Laterality: N/A;   COLONOSCOPY WITH PROPOFOL N/A 09/30/2018   Procedure: COLONOSCOPY WITH PROPOFOL;  Surgeon: Yetta Flock, MD;  Location: [place];  Service: Gastroenterology;  Laterality: N/A;   CORONARY STENT INTERVENTION N/A 09/14/2018   Procedure: CORONARY STENT INTERVENTION;  Surgeon: Martinique, Peter M, MD;  Location: [place] [facility];  Service: Cardiovascular: September 14, 2018- DES PCI: Cx99%&55% - Resolute Onyx DES 3.5 x 30 (3.6 mm), p-mLAD (prox of D1 almost to D2) - Resolute Onyx DES 3.0 x 22 (3.3 mm); DFR on pRCA 70% - 0.99, Not significant -> Rec Med Rx.   ESOPHAGOGASTRODUODENOSCOPY  05/19/2012   Procedure: ESOPHAGOGASTRODUODENOSCOPY (EGD);  Surgeon: Beryle Beams, MD;  Location: [facility] ENDOSCOPY;  Service: Endoscopy;  Laterality: N/A;   ESOPHAGOGASTRODUODENOSCOPY (EGD) WITH PROPOFOL N/A 09/30/2018   Procedure: ESOPHAGOGASTRODUODENOSCOPY (EGD) WITH PROPOFOL;  Surgeon: Yetta Flock, MD;  Location: [place];  Service: Gastroenterology;  Laterality: N/A;   GIVENS CAPSULE STUDY N/A 09/30/2018   Procedure:  GIVENS CAPSULE STUDY;  Surgeon: Yetta Flock, MD;  Location: [place];  Service: Gastroenterology;  Laterality: N/A;   HOT HEMOSTASIS N/A 09/30/2018   Procedure: HOT HEMOSTASIS (ARGON PLASMA COAGULATION/BICAP);  Surgeon: Yetta Flock, MD;  [facility];  Service: Gastroenterology;  Laterality: N/A;   INTRAVASCULAR PRESSURE WIRE/FFR STUDY N/A  09/14/2018   Procedure: INTRAVASCULAR PRESSURE WIRE/FFR STUDY;  Surgeon: Martinique, Peter M, MD;  Location: [place] [facility];  Service: Cardiovascular;  Laterality: RCA: DFR on pRCA 70% - 0.99, Not significant -> Rec Med Rx.   LEFT HEART CATH AND CORONARY ANGIOGRAPHY N/A 09/13/2018   Procedure: LEFT HEART CATH AND CORONARY ANGIOGRAPHY;  Surgeon: Leonie Man, MD;  Location: [place] [facility];  Service: Cardiovascular:: CULPRIT LESION 99% p-mCx followed by 55%mCx; pLAD 35% A D74fllowed by long 80% lesion @ SP1); pRCA 70%. Normal LVEDP. Global HK EF ~45%.  -Decision on PCI deferred until discussion about PCI versus CABG.  Concern because of long-term DOAC.   PERIPHERAL VASCULAR ATHERECTOMY  06/02/2022   Procedure: PERIPHERAL VASCULAR ATHERECTOMY;  Surgeon: BLorretta Harp MD;  [facility];  Service: Cardiovascular;;   PERIPHERAL VASCULAR BALLOON ANGIOPLASTY  06/02/2022   Procedure: PERIPHERAL VASCULAR BALLOON ANGIOPLASTY;  Surgeon: BLorretta Harp MD;  [facility];  Service: Cardiovascular;;   TRANSTHORACIC ECHOCARDIOGRAM  09/13/2018   Mildly reduced EF 45 to 50% with diffuse HK.  GR 1 DD.  Mild aortic valve calcification.  MAC.  Moderate pulmonic regurgitation.     Home Medications:  Prior to Admission medications   Medication Sig Start Date End Date Taking? Authorizing Provider  acetaminophen (TYLENOL) 500 MG tablet Take 500 mg by mouth daily as needed for mild pain or moderate pain.    [provider]  acetaminophen (TYLENOL) 650 MG CR tablet Take 650 mg by mouth daily as needed for pain.    [provider]  amLODipine (NORVASC) 5 MG tablet TAKE 1 TABLET BY MOUTH TWICE A DAY Patient taking differently: Take 2.5 mg by mouth 2 (two) times daily. 04/28/22   HLeonie Man MD  aspirin EC 81 MG tablet Take 1 tablet (81 mg total) by mouth daily. Swallow whole. 06/04/22   RCheryln Manly NP  carvedilol (COREG) 6.25 MG tablet Take 0.5  tablets (3.125 mg total) by mouth 2 (two) times daily with a meal. 08/16/21   HLeonie Man MD  clopidogrel (PLAVIX) 75 MG tablet Take 1 tablet (75 mg total) by mouth daily. 05/23/22   BLorretta Harp MD  fenofibrate (TRICOR) 145 MG tablet TAKE 1 TABLET (145 MG TOTAL) BY MOUTH DAILY. TAKE WITH A MEAL. 09/16/21   HLeonie Man MD  lisinopril (ZESTRIL) 40 MG tablet TAKE 1 TABLET BY MOUTH EVERY DAY 05/13/22   BBillie Ruddy MD  nitroGLYCERIN (NITROSTAT) 0.4 MG SL tablet Place 1 tablet (0.4 mg total) under the tongue every 5 (five) minutes as needed for up to 3 doses for chest pain. 07/19/20   HLeonie Man MD  rivaroxaban (XARELTO) 20 MG TABS tablet Take 1 tablet (20 mg total) by mouth daily with supper. 06/04/22   BLorretta Harp MD    Inpatient Medications: Scheduled Meds:   HYDROmorphone (DILAUDID) injection  1 mg Intravenous Once   Continuous Infusions:  PRN Meds:   Allergies:    Allergies  Allergen Reactions   Prednisone Other (See Comments)    No energy "stalls out".    Makes pt  Jittery.   Tramadol Other (See Comments)    Constipation,  Makes pt  Jittery.   Crestor [Rosuvastatin Calcium]     Feels fatigued, RE  CHALLENGE - UNABLE TO USE 12/28/18   Hctz [Hydrochlorothiazide]     Dizzy    Repatha [Evolocumab] Other (See Comments)    Per patient never took this medication    Social History:   Social History   Socioeconomic History   Marital status: Married    Spouse name: Not on file   Number of children: 2   Years of education: Not on file   Highest education level: Not on file  Occupational History   Occupation: SERVICE MANAGER    Employer: [employer]  Tobacco Use   Smoking status: Every Day    Packs/day: 0.50    Years: 29.00    Total pack years: 14.50    Types: Cigarettes   Smokeless tobacco: Never   Tobacco comments:    Cut back to quarter pack a day  Vaping Use   Vaping Use: Never used  Substance and Sexual Activity   Alcohol use:  Yes    Comment: Occasional   Drug use: No   Sexual activity: Not on file  Other Topics Concern   Not on file  Social History Narrative   Really is a married mother of 2.  1 son died from complications of spina bifida after multiple surgeries.   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     She has a has a Oceanographer in education.     Drinks social alcohol & denies drug use.     Pt endorses smoking maybe 2 cigarettes/day.  States she placed most of the cigarette, may have 2 puffs.   Social Determinants of Health   Financial Resource Strain: Low Risk  (10/31/2021)   Overall Financial Resource Strain (CARDIA)    Difficulty of Paying Living Expenses: Not hard at all  Food Insecurity: No Food Insecurity (10/31/2021)   Hunger Vital Sign    Worried About Running Out of Food in the Last Year: Never true    Ran Out of Food in the Last Year: Never true  Transportation Needs: No Transportation Needs (10/31/2021)   PRAPARE - Hydrologist (Medical): No    Lack of Transportation (Non-Medical): No  Physical Activity: Inactive (10/31/2021)   Exercise Vital Sign    Days of Exercise per Week: 0 days    Minutes of Exercise per Session: 0 min  Stress: No Stress Concern Present (10/31/2021)   Oracle    Feeling of Stress : Not at all  Social Connections: [place] (10/31/2021)   Social Connection and Isolation Panel [NHANES]    Frequency of Communication with Friends and Family: More than three times a week    Frequency of Social Gatherings with Friends and Family: More than three times a week    Attends Religious Services: More than 4 times per year    Active Member of [employer] or Organizations: Yes    Attends Archivist Meetings: More than 4 times per year    Marital Status: Married  Human resources officer Violence: Not At Risk (10/31/2021)   Humiliation,  Afraid, Rape, and Kick questionnaire    Fear of Current or Ex-Partner: No    Emotionally Abused: No    Physically Abused: No    Sexually Abused: No  Family History:    Family History  Problem Relation Age of Onset   Leukemia Mother    Prostate cancer Father    Cancer Father    Stroke Maternal Grandmother    Lung cancer Maternal Grandfather    Leukemia Paternal Grandmother    Cancer Paternal Grandfather    Spina bifida Son        Multiple surgeries   Colon cancer Neg Hx    Esophageal cancer Neg Hx      Review of Systems: [y] = yes, _0  = no    General: Weight gain _1 ; Weight loss _2 ; Anorexia _3 ; Fatigue _4 ; Fever _5 ; Chills _6 ; Weakness _7   Cardiac: Chest pain/pressure _8 ; Resting SOB _9 ; Exertional SOB _10 ; Orthopnea _11 ; Pedal Edema _12 ; Palpitations _13 ; Syncope _14 ; Presyncope _15 ; Paroxysmal nocturnal dyspnea_16   Pulmonary: Cough _17 ; Wheezing_18 ; Hemoptysis_19 ; Sputum _20 ; Snoring _21   GI: Vomiting_22 ; Dysphagia_23 ; Melena_24 ; Hematochezia _25 ; Heartburn_26 ; Abdominal pain _27 ; Constipation _28 ; Diarrhea _29 ; BRBPR _30   GU: Hematuria_31 ; Dysuria _32 ; Nocturia_33   Vascular: Pain in legs with walking _34 ; Pain in feet with lying flat _35 ; Non-healing sores _36 ; Stroke _37 ; TIA _38 ; Slurred speech _39 ;  Neuro: Headaches_40 ; Vertigo_41 ; Seizures_42 ; Paresthesias_43 ;Blurred vision _44 ; Diplopia _45 ; Vision changes _46   Ortho/Skin: Arthritis _47 ; Joint pain _48 ; Muscle pain _49 ; Joint swelling _50 ; Back Pain _51 ; Rash _52   Psych: Depression_53 ; Anxiety_54   Heme: Bleeding problems _55 ; Clotting disorders _56 ; Anemia _57   Endocrine: Diabetes _58 ; Thyroid dysfunction_59   Physical Exam/Data:   Vitals:   06/05/22 1848 06/05/22 1915 06/05/22 1930 06/05/22 1945  BP:  128/72 (!) 160/73 138/81  Pulse:  86 (!) 104 99  Resp:  16 17 (!) 21  Temp:      TempSrc:      SpO2: 100% 100% 100% 100%  Weight:      Height:       No intake or output data in the 24 hours ending  06/05/22 2051 Filed Weights   06/05/22 1335  Weight: 68 kg   Body mass index is 25.34 kg/m.  General: Mildly uncomfortable HEENT: normal Lymph: no adenopathy Neck: no JVD Endocrine:  No thryomegaly Vascular: No carotid bruits; FA pulses 2+ on right and 1+ on left y without bruits  Cardiac:  normal S1, S2; RRR; no murmur  Lungs:  clear to auscultation bilaterally, no wheezing, rhonchi or rales  Abd: soft, nontender, no hepatomegaly  Ext: Very large, tense and painful hematoma in the left groin and left abdomen and in the pelvic area.  Large amount of ecchymoses around her abdomen and thigh. Musculoskeletal:  No deformities, BUE and BLE strength normal and equal Skin: warm and dry  Neuro:  CNs 2-12 intact, no focal abnormalities noted Psych:  Normal affect   Telemetry:  Telemetry was personally reviewed and demonstrates: Sinus tachycardia  Relevant CV Studies: 06/02/2022: Final Impression: Successful Hawk 1 directional atherectomy followed by [facility] using spider distal protection of a short segment right above-the-knee popliteal artery CTO for claudication.  Patient does have lupus anticoagulant.  She was difficult to anticoagulate.  We will restart Lovenox bridge tonight and Xarelto tomorrow.  She will need to be on "triple therapy"  for 1 month after which we will discontinue the aspirin.  The sheath will be removed once ACT falls below 170 pressure held.  She will be hydrated overnight and discharged home in the morning.  We will obtain lower extremity arterial Doppler studies in unrefined office next week and I will see her back the week after.  Laboratory Data:  Chemistry Recent Labs  Lab 06/03/22 0519 06/05/22 2000  NA 138 137  K 3.6 3.2*  CL 108 102  CO2 22 22  GLUCOSE 98 120*  BUN 8 6*  CREATININE 0.73 0.79  CALCIUM 8.4* 9.5  GFRNONAA >60 >60  ANIONGAP 8 13    Recent Labs  Lab 06/05/22 2000  PROT 6.4*  ALBUMIN 3.4*  AST 19  ALT 17  ALKPHOS 48  BILITOT 0.6    Hematology Recent Labs  Lab 06/03/22 0519 06/05/22 2000  WBC 7.5 12.5*  RBC 3.73* 3.61*  HGB 11.1* 10.8*  HCT 33.0* 32.5*  MCV 88.5 90.0  MCH 29.8 29.9  MCHC 33.6 33.2  RDW 12.8 12.8  PLT 381 414*   Cardiac EnzymesNo results for input(s): "TROPONINI" in the last 168 hours. No results for input(s): "TROPIPOC" in the last 168 hours.  BNPNo results for input(s): "BNP", "PROBNP" in the last 168 hours.  DDimer No results for input(s): "DDIMER" in the last 168 hours.  Radiology/Studies:  VAS [place] GROIN PSEUDOANEURYSM  Result Date: 06/05/2022  ARTERIAL PSEUDOANEURYSM  Patient Name:  [name] [name]  Date of Exam:   06/05/2022 Medical Rec #: [id number]            Accession #:    [id number] Date of Birth: [birth year]            Patient Gender: F Patient Age:   69 years Exam Location[facility]  [facility] Procedure:      VAS [place] GROIN PSEUDOANEURYSM Referring Phys: Cherlynn June --------------------------------------------------------------------------------  Exam: Left groin Indications: Patient complains of groin pain and bruising. Limitations: poor ultrasound/tissue interface Comparison Study: No prior studies. Performing Technologist: Oliver Hum RVT  Examination Guidelines: A complete evaluation includes B-mode imaging, spectral Doppler, color Doppler, and power Doppler as needed of all accessible portions of each vessel. Bilateral testing is considered an integral part of a complete examination. Limited examinations for reoccurring indications may be performed as noted. +-----------+----------+-----------+------+----------+ Left DuplexPSV (cm/s) Waveform  PlaqueComment(s) +-----------+----------+-----------+------+----------+ CFA                  multiphasic                 +-----------+----------+-----------+------+----------+ Prox SFA             Multiphasic                 +-----------+----------+-----------+------+----------+ Left Vein comments:  Summary: Negative for  obvious evidence of left groin pseudoaneurysm. Large anechoic area suggestive of hematoma is noted in the left groin.   --------------------------------------------------------------------------------    Preliminary    PERIPHERAL VASCULAR CATHETERIZATION  Result Date: 06/02/2022 Images from the original result were not included.  [id number] LOCATION:  FACILITY: [facility] PHYSICIAN: Quay Burow, M.D. 1956/02/01 DATE OF PROCEDURE:  06/02/2022 DATE OF DISCHARGE: PV Angiogram/Intervention History obtained from chart review.William Laske is a 66 y.o.   mild to moderately overweight married Caucasian female mother of 29, grandmother of 2 grandchildren referred by Almyra Deforest, PA-C for peripheral vascular valuation.  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  I last saw her in the office 03/21/2022.  Her risk factors include ongoing tobacco abuse 1/2 pack/day, treated hypertension and hyperlipidemia.  She does have CAD status post LAD and circumflex intervention back in January 2020.  She is never had a stroke.  She does have lupus anticoagulant with extensive lower extremity DVT and pulmonary emboli status post endovascular therapy with stenting of her lower extremity venous vasculature as well as placement of an IVC filter.  She was on Coumadin for some time and transition to Xarelto.  When she was on triple therapy for her LAD and circumflex stent she had GI bleeding.  Aspirin was discontinued.  She was not tolerant to Plavix.  She does complain of bilateral calf claudication over the last year with Dopplers performed 02/27/2022 revealing a right ABI of 0.57, left of 0.52.  She had high-grade lesions in her distal right SFA and popliteal artery and an occluded left SFA.  Since I saw her 2 months ago she has become more symptomatic on the right side.  She has dependent rubor, resting foot pain and right calf claudication that prevents her from exercising.  She  wishes to proceed with outpatient diagnostic peripheral angiography and potential endovascular therapy.  We will have to be mindful of her clotting issues with her lupus anticoagulant being on Xarelto.  I will have our Pharm.D.'s orchestrate a Lovenox bridge. Pre Procedure Diagnosis: Peripheral arterial disease Post Procedure Diagnosis: Peripheral arterial disease Operators: Dr. Quay Burow Procedures Performed:  1.  Ultrasound-guided left common femoral access  2.  Abdominal aortogram/bilateral iliac angiogram/bifemoral runoff  3.  Contralateral access (second-order catheter placement)  4.  Hawk 1 directional atherectomy/DCB right above-the-knee popliteal artery             5.  Spider distal protection device right below the knee popliteal artery PROCEDURE DESCRIPTION: The patient was brought to the second floor [facility] cath lab in the the postabsorptive state. She was premedicated with IV Versed and fentanyl. Her left groin was prepped and shaved in usual sterile fashion. Xylocaine 1% was used for local anesthesia. A 5 French sheath was inserted into the left common femoral artery using standard Seldinger technique.  Ultrasound was used to identify the left common femoral artery and guide access.  A digital image was captured and placed the patient's chart.  A 5 French pigtail catheter was placed in distal abdominal aorta.  Distal abdominal aortography, bilateral iliac angiography with bifemoral runoff was performed using bolus chase, digital subtraction and step table technique.  Omnipaque dye was used for the entirety of the case (170 cc contrast total to patient).  Retrograde aortic pressures monitored during the case.  Angiographic Data: 1: Abdominal aorta-mild atherosclerosis 2: Left lower extremity-moderately long segment mid left SFA CTO with three-vessel runoff 3: Right lower extremity-short CTO right above-the-knee popliteal artery with two-vessel runoff.  The anterior tibial is occluded in  its distal portion   [name] [name] has CTOs bilaterally, on the right just above the knee in the above-knee popliteal artery and a left mid SFA.  She is more symptomatic on the right.  We will proceed with right above-the-knee popliteal directional atherectomy followed by drug-coated balloon angioplasty using spider distal protection. Procedure Description: Contralateral access was obtained and a 7 French 55 cm multipurpose Ansell sheath was then advanced across the iliac bifurcation into the right common femoral artery (second-order cath placement).  The patient received 24,000's of heparin with an ACT of 299 at the maximum.  She was already on aspirin and clopidogrel.  She had stopped her Xarelto on 05/31/2022 and was placed on a Lovenox bridge. I was able to cross the CTO with a Viance CTO catheter and a 0.14/6 g Shepperd wire.  I then performed PTA of the right above-the-knee popliteal CTO with a 2.5 mm x 40 mm balloon.  Following this I successfully placed a 5 mm spider distal protection device in the right below the knee popliteal artery.  I then performed directional atherectomy with an LS directional atherectomy device removing a significant amount of atherosclerotic plaque.  I then performed DCB using a 4 mm x 60 mm long impact Admiral drug-coated balloon at 4 atm for 3 minutes resulting in reduction of a total occlusion to 0% residual with excellent flow.  The trifurcation remained intact.  There was a very small nonflow limiting flap within the balloon segment. Final Impression: Successful Hawk 1 directional atherectomy followed by [facility] using spider distal protection of a short segment right above-the-knee popliteal artery CTO for claudication.  Patient does have lupus anticoagulant.  She was difficult to anticoagulate.  We will restart Lovenox bridge tonight and Xarelto tomorrow.  She will need to be on "triple therapy" for 1 month after which we will discontinue the aspirin.  The sheath will be removed  once ACT falls below 170 pressure held.  She will be hydrated overnight and discharged home in the morning.  We will obtain lower extremity arterial Doppler studies in unrefined office next week and I will see her back the week after. Quay Burow. MD, [facility] 06/02/2022 2:50 PM     Assessment and Plan:   Left groin hematoma after arterial intervention.  Notably the patient is on dual antiplatelet therapy and anticoagulation after femoral artery intervention on 10/16.  Given her recent stent, discontinuing antiplatelet therapy would be worrisome and given her history of clotting and lupus anticoagulant, minimizing time off of anticoagulation is also prudent.  It is difficult to tell whether this has been an ongoing ooze from the procedure versus an arterial extravasation, therefore would recommend CT abdomen/pelvis/left thigh with contrast to evaluate arterial vasculature and for a RP hematoma as well.  She may need to be admitted for monitoring pending the results of the CT scan.      For questions or updates, please contact [place] Please consult [email or web address] for contact info under     Signed, Doyne Keel, MD  06/05/2022 8:51 PM

## 2022-06-05 NOTE — Telephone Encounter (Signed)
Called pt back regarding needed to get ultrasound per Dr. Gwenlyn Found to rule out pseudoaneurysm. Pt has already been taken to [facility] ED by her husband because she said the pain just got to much to handle. Explained to pt that I would make Dr. Gwenlyn Found aware. Pt verbalizes understanding.

## 2022-06-05 NOTE — ED Triage Notes (Signed)
Pt had procedure Monday. Pt has bruising and swelling that worsened this morning to left femoral area. Pt endorses severe pain, pt takes xaretlo, Plavix and baby ASA.

## 2022-06-05 NOTE — Telephone Encounter (Signed)
Spoke with pt regarding recent PV procedure and left groin site. Pt states that she has followed all post procedure instruction, however last night she stood to go to the restroom and felt a sharp pain at the groin site. Pt states that after a few minutes the pain went away. Pt states that she had a 2 more episodes of sharp pain at the site since last night. Pt denies bleeding from the site and area is not warm to the touch. Pt does describe bruising and soreness around the area which I told her is expected. Pt does say that the area is swollen but says that the area feels soft and swelling has not increased. Pt states there are no firm spots at or near insertion site. Will route to Dr. Gwenlyn Found to advise.

## 2022-06-05 NOTE — ED Provider Notes (Incomplete)
[facility] EMERGENCY DEPARTMENT Provider Note   CSN: [id number] Arrival date & time: 06/05/22  1300     History {Add pertinent medical, surgical, social history, OB history to HPI:1} No chief complaint on file.   [name] [name] is a 66 y.o. female  HPI     Home Medications Prior to Admission medications   Medication Sig Start Date End Date Taking? Authorizing Provider  acetaminophen (TYLENOL) 500 MG tablet Take 500 mg by mouth daily as needed for mild pain or moderate pain.    [provider]  acetaminophen (TYLENOL) 650 MG CR tablet Take 650 mg by mouth daily as needed for pain.    [provider]  amLODipine (NORVASC) 5 MG tablet TAKE 1 TABLET BY MOUTH TWICE A DAY Patient taking differently: Take 2.5 mg by mouth 2 (two) times daily. 04/28/22   Leonie Man, MD  aspirin EC 81 MG tablet Take 1 tablet (81 mg total) by mouth daily. Swallow whole. 06/04/22   Cheryln Manly, NP  carvedilol (COREG) 6.25 MG tablet Take 0.5 tablets (3.125 mg total) by mouth 2 (two) times daily with a meal. 08/16/21   Leonie Man, MD  clopidogrel (PLAVIX) 75 MG tablet Take 1 tablet (75 mg total) by mouth daily. 05/23/22   Lorretta Harp, MD  fenofibrate (TRICOR) 145 MG tablet TAKE 1 TABLET (145 MG TOTAL) BY MOUTH DAILY. TAKE WITH A MEAL. 09/16/21   Leonie Man, MD  lisinopril (ZESTRIL) 40 MG tablet TAKE 1 TABLET BY MOUTH EVERY DAY 05/13/22   Billie Ruddy, MD  nitroGLYCERIN (NITROSTAT) 0.4 MG SL tablet Place 1 tablet (0.4 mg total) under the tongue every 5 (five) minutes as needed for up to 3 doses for chest pain. 07/19/20   Leonie Man, MD  rivaroxaban (XARELTO) 20 MG TABS tablet Take 1 tablet (20 mg total) by mouth daily with supper. 06/04/22   Lorretta Harp, MD      Allergies    Prednisone, Tramadol, Crestor [rosuvastatin calcium], Hctz [hydrochlorothiazide], and Repatha [evolocumab]    Review of Systems   Review of  Systems  Physical Exam Updated Vital Signs BP 138/81   Pulse 99   Temp 98.5 F (36.9 C) (Oral)   Resp (!) 21   Ht 5' 4.5" (1.638 m)   Wt 68 kg   SpO2 100%   BMI 25.34 kg/m  Physical Exam  ED Results / Procedures / Treatments   Labs (all labs ordered are listed, but only abnormal results are displayed) Labs Reviewed  CBC WITH DIFFERENTIAL/PLATELET  COMPREHENSIVE METABOLIC PANEL    EKG None  Radiology VAS [place] GROIN PSEUDOANEURYSM  Result Date: 06/05/2022  ARTERIAL PSEUDOANEURYSM  Patient Name:  [name] [name]  Date of Exam:   06/05/2022 Medical Rec #: [id number]            Accession #:    [id number] Date of Birth: 02-05-1956            Patient Gender: F Patient Age:   12 years Exam Location:  [facility] Procedure:      VAS [place] GROIN PSEUDOANEURYSM Referring Phys: Cherlynn June --------------------------------------------------------------------------------  Exam: Left groin Indications: Patient complains of groin pain and bruising. Limitations: poor ultrasound/tissue interface Comparison Study: No prior studies. Performing Technologist: Oliver Hum RVT  Examination Guidelines: A complete evaluation includes B-mode imaging, spectral Doppler, color Doppler, and power Doppler as needed of all accessible portions of each vessel. Bilateral testing is considered  an integral part of a complete examination. Limited examinations for reoccurring indications may be performed as noted. +-----------+----------+-----------+------+----------+ Left DuplexPSV (cm/s) Waveform  PlaqueComment(s) +-----------+----------+-----------+------+----------+ CFA                  multiphasic                 +-----------+----------+-----------+------+----------+ Prox SFA             Multiphasic                 +-----------+----------+-----------+------+----------+ Left Vein comments:  Summary: Negative for obvious evidence of left groin pseudoaneurysm. Large anechoic area suggestive of  hematoma is noted in the left groin.   --------------------------------------------------------------------------------    Preliminary     Procedures Procedures   Medications Ordered in ED Medications  oxyCODONE-acetaminophen (PERCOCET/ROXICET) 5-325 MG per tablet 2 tablet (2 tablets Oral Given 06/05/22 1406)  HYDROmorphone (DILAUDID) injection 0.5 mg (0.5 mg Intravenous Given 06/05/22 2006)    ED Course/ Medical Decision Making/ A&P                           Medical Decision Making Amount and/or Complexity of Data Reviewed Labs: ordered. Radiology: ordered.  Risk Prescription drug management.   66 year old female presents emerged department for evaluation of left upper thigh/lower abdominal pain after left femoral cath.  Differential diagnosis includes but is not limited to arterial bleed, venous bleed, pseudoaneurysm, hematoma.  Vital signs show normotensive, afebrile, normal pulse rate, satting well on room air with any increased work of breathing.  Physical exam as noted above.  Care delayed due to patient wait times.  Ultrasound imaging ordered in triage.  Ultrasound is negative for obvious evidence of any left groin pseudoaneurysm.  There is a large anechoic area suggestive of a hematoma.  Patient received Percocet while in the waiting room.  On my evaluation, the patient has significant swelling and bruising noted to the lower abdomen, upper left thigh, and going into the mons pubis area.  Area is firm with surrounding bruising.  She has dopplerable pulses bilaterally.  Compartments are soft.  Coloration and hematuria appear and feel symmetric bilaterally.  Concern for the extent of this hematoma and given the patient is on blood thinners and antiplatelet therapy.  On chart evaluation, patient was discharged on 06-03-2022 after a " Hawk 1 directional atherectomy followed by [facility] using spider distal protection of a short segment right above-the-knee popliteal artery CTO for  claudication."     11:52 PM Care of [name] [name] transferred to [facility] at the end of my shift as the patient will require reassessment once labs/imaging have resulted. Patient presentation, ED course, and plan of care discussed with review of all pertinent labs and imaging. Please see his/her note for further details regarding further ED course and disposition. Plan at time of handoff is follow up on imaging. . This may be altered or completely changed at the discretion of the oncoming team pending results of further workup.  Final Clinical Impression(s) / ED Diagnoses Final diagnoses:  None    Rx / DC Orders ED Discharge Orders     None

## 2022-06-05 NOTE — ED Notes (Signed)
Pt has left upper thigh and lower abdomen with visible bruising.  Pulse in right leg strong; pulse in left leg moderate.  Pt can move toes on both legs without difficulty.  No new orders at this time.

## 2022-06-05 NOTE — ED Provider Triage Note (Signed)
Emergency Medicine Provider Triage Evaluation Note  [name] [name] , a 66 y.o. female  was evaluated in triage.  Pt complains of postoperative pain.  Patient had an abdominal aortogram with lower extremity on the 16th.  She states that she had mild swelling at the access site.  Overnight she noticed moderate to significant swelling between the site and the umbilicus.  Ecchymosis noted in the region with mild to moderate swelling.  Patient recommended to come to the emergency department by Dr. Gwenlyn Found, vascular surgery.  Ultrasound procedure ordered by vascular surgery.  Patient with significant clotting history on long-term Xarelto, currently also on Plavix and aspirin.  Patient complains of pain at the site, swelling. Denies shortness of breath, chest pain  Review of Systems  Positive: As above Negative: As above  Physical Exam  BP (!) 153/114 (BP Location: Right Arm)   Pulse 95   Temp 98.3 F (36.8 C) (Oral)   Resp 18   Ht 5' 4.5" (1.638 m)   Wt 68 kg   SpO2 100%   BMI 25.34 kg/m  Gen:   Awake, no distress   Resp:  Normal effort  MSK:   Moves extremities without difficulty  Other:  Swelling and ecchymosis noted to the left side of the groin between incision site and umbilicus  Medical Decision Making  Medically screening exam initiated at 1:38 PM.  Appropriate orders placed.  [name] [name] was informed that the remainder of the evaluation will be completed by another provider, this initial triage assessment does not replace that evaluation, and the importance of remaining in the ED until their evaluation is complete.     Dorothyann Peng, PA-C 06/05/22 1346

## 2022-06-05 NOTE — ED Provider Notes (Signed)
[place] EMERGENCY DEPARTMENT Provider Note   CSN: [id number] Arrival date & time: 06/05/22  1300     History No chief complaint on file.   [name] [name] is a 66 y.o. female with past medical h/o HTN, CAD s/p LAD/Lcx PCI, lupus, history of DVT/PE on chronic anticoagulation presents to the Er for evaluation of left groin pain and bruising.  Patient went an SFA intervention with Dr. Alvester Chou on 06-02-2022.  She reports that she was fine upon discharge and was not experiencing much pain.  She reports that yesterday she started have some pain, but significantly worsened this morning.  She denies any radiation of the pain down her leg, reports that it is mainly in her groin and radiates to her vagina and a little into her belly.  She denies any nausea, vomiting, or fevers.  Denies any numbness or tingling down her leg.  She called the her cardiologist who recommended getting an ultrasound to rule out a pseudoaneurysm.  She reports that she was experiencing too much pain and decided to come into the emergency department.  HPI     Home Medications Prior to Admission medications   Medication Sig Start Date End Date Taking? Authorizing Provider  acetaminophen (TYLENOL) 500 MG tablet Take 500 mg by mouth daily as needed for mild pain or moderate pain.    [provider]  acetaminophen (TYLENOL) 650 MG CR tablet Take 650 mg by mouth daily as needed for pain.    [provider]  amLODipine (NORVASC) 5 MG tablet TAKE 1 TABLET BY MOUTH TWICE A DAY Patient taking differently: Take 2.5 mg by mouth 2 (two) times daily. 04/28/22   Leonie Man, MD  aspirin EC 81 MG tablet Take 1 tablet (81 mg total) by mouth daily. Swallow whole. 06/04/22   Cheryln Manly, NP  carvedilol (COREG) 6.25 MG tablet Take 0.5 tablets (3.125 mg total) by mouth 2 (two) times daily with a meal. 08/16/21   Leonie Man, MD  clopidogrel (PLAVIX) 75 MG tablet Take 1 tablet (75 mg  total) by mouth daily. 05/23/22   Lorretta Harp, MD  fenofibrate (TRICOR) 145 MG tablet TAKE 1 TABLET (145 MG TOTAL) BY MOUTH DAILY. TAKE WITH A MEAL. 09/16/21   Leonie Man, MD  lisinopril (ZESTRIL) 40 MG tablet TAKE 1 TABLET BY MOUTH EVERY DAY 05/13/22   Billie Ruddy, MD  nitroGLYCERIN (NITROSTAT) 0.4 MG SL tablet Place 1 tablet (0.4 mg total) under the tongue every 5 (five) minutes as needed for up to 3 doses for chest pain. 07/19/20   Leonie Man, MD  rivaroxaban (XARELTO) 20 MG TABS tablet Take 1 tablet (20 mg total) by mouth daily with supper. 06/04/22   Lorretta Harp, MD      Allergies    Prednisone, Tramadol, Crestor [rosuvastatin calcium], Hctz [hydrochlorothiazide], and Repatha [evolocumab]    Review of Systems   Review of Systems  Constitutional:  Negative for chills and fever.  Respiratory:  Negative for shortness of breath.   Cardiovascular:  Negative for chest pain.  Gastrointestinal:  Positive for abdominal pain. Negative for nausea and vomiting.  Musculoskeletal:  Positive for myalgias.  Skin:  Positive for wound.    Physical Exam Updated Vital Signs BP 138/81   Pulse 99   Temp 98.5 F (36.9 C) (Oral)   Resp (!) 21   Ht 5' 4.5" (1.638 m)   Wt 68 kg   SpO2 100%  BMI 25.34 kg/m  Physical Exam Vitals and nursing note reviewed.  Constitutional:      General: She is not in acute distress.    Appearance: Normal appearance. She is not ill-appearing or toxic-appearing.  HENT:     Head: Normocephalic and atraumatic.  Eyes:     General: No scleral icterus. Cardiovascular:     Rate and Rhythm: Normal rate and regular rhythm.     Comments: Dopplerable pulses bilaterally. Pulmonary:     Effort: Pulmonary effort is normal. No respiratory distress.     Breath sounds: Normal breath sounds.  Abdominal:     General: Bowel sounds are normal. There is no distension.     Tenderness: There is abdominal tenderness. There is no guarding or rebound.      Comments: In the left groin, there is a small, slightly weeping clear fluid incision in the left groin.  There is significant amount of surrounding bruising and firmness that goes slightly inferior into the upper thigh, slightly superior into the lower left belly, and into the mons pubis of her vagina.  Areas are firm and tender to touch.  Overlying ecchymosis.  No overlying erythema.  Musculoskeletal:        General: No deformity.     Cervical back: Normal range of motion.     Comments: Dopplerable pulses.  Sensation intact.  Coloration and temperature appear and feel symmetric.  She is able to flex and extend her feet.  Skin:    General: Skin is warm and dry.  Neurological:     General: No focal deficit present.     Mental Status: She is alert. Mental status is at baseline.     ED Results / Procedures / Treatments   Labs (all labs ordered are listed, but only abnormal results are displayed) Labs Reviewed  CBC WITH DIFFERENTIAL/PLATELET  COMPREHENSIVE METABOLIC PANEL    EKG None  Radiology VAS [place] GROIN PSEUDOANEURYSM  Result Date: 06/05/2022  ARTERIAL PSEUDOANEURYSM  Patient Name:  [name] [name]  Date of Exam:   06/05/2022 Medical Rec #: [id number]            Accession #:    [id number] Date of Birth: 1955-10-05            Patient Gender: F Patient Age:   66 years Exam Location:  [facility] Procedure:      VAS [place] GROIN PSEUDOANEURYSM Referring Phys: Cherlynn June --------------------------------------------------------------------------------  Exam: Left groin Indications: Patient complains of groin pain and bruising. Limitations: poor ultrasound/tissue interface Comparison Study: No prior studies. Performing Technologist: Oliver Hum RVT  Examination Guidelines: A complete evaluation includes B-mode imaging, spectral Doppler, color Doppler, and power Doppler as needed of all accessible portions of each vessel. Bilateral testing is considered an integral part of a  complete examination. Limited examinations for reoccurring indications may be performed as noted. +-----------+----------+-----------+------+----------+ Left DuplexPSV (cm/s) Waveform  PlaqueComment(s) +-----------+----------+-----------+------+----------+ CFA                  multiphasic                 +-----------+----------+-----------+------+----------+ Prox SFA             Multiphasic                 +-----------+----------+-----------+------+----------+ Left Vein comments:  Summary: Negative for obvious evidence of left groin pseudoaneurysm. Large anechoic area suggestive of hematoma is noted in the left groin.   --------------------------------------------------------------------------------    Preliminary  Procedures Procedures   Medications Ordered in ED Medications  oxyCODONE-acetaminophen (PERCOCET/ROXICET) 5-325 MG per tablet 2 tablet (2 tablets Oral Given 06/05/22 1406)  HYDROmorphone (DILAUDID) injection 0.5 mg (0.5 mg Intravenous Given 06/05/22 2006)    ED Course/ Medical Decision Making/ A&P                           Medical Decision Making Amount and/or Complexity of Data Reviewed Labs: ordered. Radiology: ordered.  Risk Prescription drug management.   66 year old female presents emerged department for evaluation of left upper thigh/lower abdominal pain after left femoral cath.  Differential diagnosis includes but is not limited to arterial bleed, venous bleed, pseudoaneurysm, hematoma.  Vital signs show normotensive, afebrile, normal pulse rate, satting well on room air with any increased work of breathing.  Physical exam as noted above.  Care delayed due to patient wait times.  Ultrasound imaging ordered in triage.  Ultrasound is negative for obvious evidence of any left groin pseudoaneurysm.  There is a large anechoic area suggestive of a hematoma.  Patient received Percocet while in the waiting room.  On my evaluation, the patient has significant  swelling and bruising noted to the lower abdomen, upper left thigh, and going into the mons pubis area.  Area is firm with surrounding bruising.  She has dopplerable pulses bilaterally.  Compartments are soft.  Coloration and hematuria appear and feel symmetric bilaterally.  Concern for the extent of this hematoma and given the patient is on blood thinners and antiplatelet therapy.  On chart evaluation, patient was discharged on 06-03-2022 after a " Hawk 1 directional atherectomy followed by [facility] using spider distal protection of a short segment right above-the-knee popliteal artery CTO for claudication."  From the HPI on discharge summary, "She had lower extremity Doppler 02/2022 revealing right ABI of 0.57 and left of 0.52.  High-grade lesions in distal right SFA and popliteal artery as well as occluded left SFA.  She was seen in the office with Dr. Alvester Chou on 10/6 with her complaints.  It was recommended that she undergo outpatient PV angiography.  She was bridged with Lovenox while holding her Xarelto prior to procedure."   Plan will be for triple therapy with aspirin, Plavix and Xarelto for 1 month at which time aspirin will be discharged. Per chart, site was examined and was stable without bruising or hematoma.   I spoke with cardiology fellow on-call, Dr.Narcisse, who recommended following up with CT imaging.  He came down and personally evaluated the patient at bedside.  Please see his note for further information.  Patient was given 0.5 mg of Dilaudid.  On reevaluation, patient still experiencing significant amount of pain.  Will order 1 mg of Dilaudid.  On reevaluation, patient's pain is better under control and she was finally able to get some rest.  On reevaluation, patient sleeping with equal chest rise.  Patient is on monitor with stable vitals.  I independently reviewed and interpreted the patient's labs.  Patient's hemoglobin at 10.8, hemoglobin 2 days ago was 11.1, unsure if this is  preprocedure postprocedure.  Could have had a higher level of EBL during the procedure?  Patient's hemoglobin before that was 13.8.  BC also shows an elevated white blood cell count of 12.5 with a left shift.  Elevated platelets although this is constant for her.  CMP shows decreased potassium 3.2.  Glucose at 120, mildly decreased BUN at 6.  Total protein and albumin also decreased  as well.  Will order another CBC in a few hours to see if this is downtrending. Potassium ordered for mild hypokalemia.   Still awaiting CT imaging.  Will handoff to oncoming shift.  11:52 PM Care of Schylar Allard transferred to [facility] at the end of my shift as the patient will require reassessment once labs/imaging have resulted. Patient presentation, ED course, and plan of care discussed with review of all pertinent labs and imaging. Please see his/her note for further details regarding further ED course and disposition. Plan at time of handoff is follow up on imaging. If normal, likely admission for pain control and monitoring. This may be altered or completely changed at the discretion of the oncoming team pending results of further workup.  Final Clinical Impression(s) / ED Diagnoses Final diagnoses:  None    Rx / DC Orders ED Discharge Orders     None         Sherrell Puller, PA-C 06/06/22 0023    Audley Hose, MD 06/06/22 2213

## 2022-06-05 NOTE — Progress Notes (Signed)
Left groin pseudoaneurysm evaluation has been completed. Preliminary results can be found in CV Proc through chart review.  Results were given to Cherlynn June PA.  06/05/22 3:10 PM Carlos Levering RVT

## 2022-06-06 ENCOUNTER — Other Ambulatory Visit: Payer: Self-pay

## 2022-06-06 ENCOUNTER — Telehealth (HOSPITAL_COMMUNITY): Payer: Self-pay

## 2022-06-06 ENCOUNTER — Emergency Department (HOSPITAL_COMMUNITY): Payer: Medicare Other | Admitting: Certified Registered Nurse Anesthetist

## 2022-06-06 ENCOUNTER — Inpatient Hospital Stay (HOSPITAL_COMMUNITY): Payer: Medicare Other

## 2022-06-06 ENCOUNTER — Other Ambulatory Visit (HOSPITAL_COMMUNITY): Payer: Self-pay

## 2022-06-06 ENCOUNTER — Emergency Department (HOSPITAL_COMMUNITY): Payer: Medicare Other

## 2022-06-06 ENCOUNTER — Encounter (HOSPITAL_COMMUNITY)
Admission: EM | Disposition: A | Discharge status: Home / Self Care | Payer: Self-pay | Source: Home / Self Care | Attending: Cardiovascular Disease

## 2022-06-06 ENCOUNTER — Encounter (HOSPITAL_COMMUNITY): Payer: Self-pay | Admitting: Certified Registered Nurse Anesthetist

## 2022-06-06 DIAGNOSIS — T82838A Hemorrhage of vascular prosthetic devices, implants and grafts, initial encounter: Secondary | ICD-10-CM

## 2022-06-06 DIAGNOSIS — F17210 Nicotine dependence, cigarettes, uncomplicated: Secondary | ICD-10-CM

## 2022-06-06 DIAGNOSIS — Z7982 Long term (current) use of aspirin: Secondary | ICD-10-CM | POA: Diagnosis not present

## 2022-06-06 DIAGNOSIS — Z86711 Personal history of pulmonary embolism: Secondary | ICD-10-CM | POA: Diagnosis not present

## 2022-06-06 DIAGNOSIS — I739 Peripheral vascular disease, unspecified: Secondary | ICD-10-CM | POA: Diagnosis not present

## 2022-06-06 DIAGNOSIS — I70221 Atherosclerosis of native arteries of extremities with rest pain, right leg: Secondary | ICD-10-CM | POA: Diagnosis present

## 2022-06-06 DIAGNOSIS — S75002A Unspecified injury of femoral artery, left leg, initial encounter: Secondary | ICD-10-CM | POA: Diagnosis not present

## 2022-06-06 DIAGNOSIS — I700 Atherosclerosis of aorta: Secondary | ICD-10-CM | POA: Diagnosis present

## 2022-06-06 DIAGNOSIS — Z86718 Personal history of other venous thrombosis and embolism: Secondary | ICD-10-CM | POA: Diagnosis not present

## 2022-06-06 DIAGNOSIS — D62 Acute posthemorrhagic anemia: Secondary | ICD-10-CM | POA: Diagnosis not present

## 2022-06-06 DIAGNOSIS — Z1152 Encounter for screening for COVID-19: Secondary | ICD-10-CM | POA: Diagnosis not present

## 2022-06-06 DIAGNOSIS — Z8042 Family history of malignant neoplasm of prostate: Secondary | ICD-10-CM | POA: Diagnosis not present

## 2022-06-06 DIAGNOSIS — I2510 Atherosclerotic heart disease of native coronary artery without angina pectoris: Secondary | ICD-10-CM | POA: Diagnosis present

## 2022-06-06 DIAGNOSIS — Z806 Family history of leukemia: Secondary | ICD-10-CM | POA: Diagnosis not present

## 2022-06-06 DIAGNOSIS — D6862 Lupus anticoagulant syndrome: Secondary | ICD-10-CM | POA: Diagnosis present

## 2022-06-06 DIAGNOSIS — Z888 Allergy status to other drugs, medicaments and biological substances status: Secondary | ICD-10-CM | POA: Diagnosis not present

## 2022-06-06 DIAGNOSIS — I252 Old myocardial infarction: Secondary | ICD-10-CM | POA: Diagnosis not present

## 2022-06-06 DIAGNOSIS — Y713 Surgical instruments, materials and cardiovascular devices (including sutures) associated with adverse incidents: Secondary | ICD-10-CM | POA: Diagnosis present

## 2022-06-06 DIAGNOSIS — Z955 Presence of coronary angioplasty implant and graft: Secondary | ICD-10-CM | POA: Diagnosis not present

## 2022-06-06 DIAGNOSIS — F17200 Nicotine dependence, unspecified, uncomplicated: Secondary | ICD-10-CM | POA: Diagnosis not present

## 2022-06-06 DIAGNOSIS — I10 Essential (primary) hypertension: Secondary | ICD-10-CM | POA: Diagnosis present

## 2022-06-06 DIAGNOSIS — Y838 Other surgical procedures as the cause of abnormal reaction of the patient, or of later complication, without mention of misadventure at the time of the procedure: Secondary | ICD-10-CM | POA: Diagnosis present

## 2022-06-06 DIAGNOSIS — Z7902 Long term (current) use of antithrombotics/antiplatelets: Secondary | ICD-10-CM | POA: Diagnosis not present

## 2022-06-06 DIAGNOSIS — T81718A Complication of other artery following a procedure, not elsewhere classified, initial encounter: Secondary | ICD-10-CM | POA: Diagnosis present

## 2022-06-06 DIAGNOSIS — G8918 Other acute postprocedural pain: Secondary | ICD-10-CM | POA: Diagnosis present

## 2022-06-06 DIAGNOSIS — I724 Aneurysm of artery of lower extremity: Secondary | ICD-10-CM | POA: Diagnosis present

## 2022-06-06 DIAGNOSIS — D649 Anemia, unspecified: Secondary | ICD-10-CM | POA: Diagnosis not present

## 2022-06-06 DIAGNOSIS — I97638 Postprocedural hematoma of a circulatory system organ or structure following other circulatory system procedure: Secondary | ICD-10-CM | POA: Diagnosis present

## 2022-06-06 DIAGNOSIS — Z79899 Other long term (current) drug therapy: Secondary | ICD-10-CM | POA: Diagnosis not present

## 2022-06-06 DIAGNOSIS — R509 Fever, unspecified: Secondary | ICD-10-CM | POA: Diagnosis not present

## 2022-06-06 DIAGNOSIS — E781 Pure hyperglyceridemia: Secondary | ICD-10-CM | POA: Diagnosis present

## 2022-06-06 DIAGNOSIS — Z7901 Long term (current) use of anticoagulants: Secondary | ICD-10-CM | POA: Diagnosis not present

## 2022-06-06 HISTORY — PX: ARTERY REPAIR: SHX5117

## 2022-06-06 HISTORY — PX: HEMATOMA EVACUATION: SHX5118

## 2022-06-06 HISTORY — PX: WOUND EXPLORATION: SHX6188

## 2022-06-06 HISTORY — PX: APPLICATION OF WOUND VAC: SHX5189

## 2022-06-06 LAB — CBC
HCT: 30.5 % — ABNORMAL LOW (ref 36.0–46.0)
HCT: 24.5 % — ABNORMAL LOW (ref 36.0–46.0)
HCT: 29.7 % — ABNORMAL LOW (ref 36.0–46.0)
Hemoglobin: 9.8 g/dL — ABNORMAL LOW (ref 12.0–15.0)
Hemoglobin: 8.4 g/dL — ABNORMAL LOW (ref 12.0–15.0)
Hemoglobin: 10 g/dL — ABNORMAL LOW (ref 12.0–15.0)
MCH: 29.6 pg (ref 26.0–34.0)
MCH: 29.4 pg (ref 26.0–34.0)
MCH: 28.2 pg (ref 26.0–34.0)
MCHC: 32.1 g/dL (ref 30.0–36.0)
MCHC: 34.3 g/dL (ref 30.0–36.0)
MCHC: 33.7 g/dL (ref 30.0–36.0)
MCV: 92.1 fL (ref 80.0–100.0)
MCV: 85.7 fL (ref 80.0–100.0)
MCV: 83.9 fL (ref 80.0–100.0)
Platelets: 392 10*3/uL (ref 150–400)
Platelets: 331 10*3/uL (ref 150–400)
Platelets: 316 10*3/uL (ref 150–400)
RBC: 3.31 MIL/uL — ABNORMAL LOW (ref 3.87–5.11)
RBC: 2.86 MIL/uL — ABNORMAL LOW (ref 3.87–5.11)
RBC: 3.54 MIL/uL — ABNORMAL LOW (ref 3.87–5.11)
RDW: 13 % (ref 11.5–15.5)
RDW: 14.6 % (ref 11.5–15.5)
RDW: 17.1 % — ABNORMAL HIGH (ref 11.5–15.5)
WBC: 10.5 10*3/uL (ref 4.0–10.5)
WBC: 9.3 10*3/uL (ref 4.0–10.5)
WBC: 14.8 10*3/uL — ABNORMAL HIGH (ref 4.0–10.5)
nRBC: 0 % (ref 0.0–0.2)
nRBC: 0 % (ref 0.0–0.2)
nRBC: 0 % (ref 0.0–0.2)

## 2022-06-06 LAB — BASIC METABOLIC PANEL
Anion gap: 9 (ref 5–15)
BUN: 6 mg/dL — ABNORMAL LOW (ref 8–23)
CO2: 22 mmol/L (ref 22–32)
Calcium: 8.6 mg/dL — ABNORMAL LOW (ref 8.9–10.3)
Chloride: 105 mmol/L (ref 98–111)
Creatinine, Ser: 0.74 mg/dL (ref 0.44–1.00)
GFR, Estimated: >60 mL/min (ref >60)
Glucose, Bld: 111 mg/dL — ABNORMAL HIGH (ref 70–99)
Potassium: 3.7 mmol/L (ref 3.5–5.1)
Sodium: 136 mmol/L (ref 135–145)

## 2022-06-06 LAB — PROTIME-INR
INR: 1.4 — ABNORMAL HIGH (ref 0.8–1.2)
Prothrombin Time: 16.9 seconds — ABNORMAL HIGH (ref 11.4–15.2)

## 2022-06-06 LAB — URINALYSIS, ROUTINE W REFLEX MICROSCOPIC
Bilirubin Urine: NEGATIVE
Glucose, UA: NEGATIVE mg/dL
Ketones, ur: NEGATIVE mg/dL
Nitrite: NEGATIVE
Protein, ur: NEGATIVE mg/dL
Specific Gravity, Urine: 1.021 (ref 1.005–1.030)
pH: 5 (ref 5.0–8.0)

## 2022-06-06 LAB — POCT I-STAT 7, (LYTES, BLD GAS, ICA,H+H)
Acid-base deficit: 3 mmol/L — ABNORMAL HIGH (ref 0.0–2.0)
Bicarbonate: 23 mmol/L (ref 20.0–28.0)
Calcium, Ion: 1.25 mmol/L (ref 1.15–1.40)
HCT: 22 % — ABNORMAL LOW (ref 36.0–46.0)
Hemoglobin: 7.5 g/dL — ABNORMAL LOW (ref 12.0–15.0)
O2 Saturation: 100 %
Patient temperature: 37
Potassium: 3.6 mmol/L (ref 3.5–5.1)
Sodium: 136 mmol/L (ref 135–145)
TCO2: 24 mmol/L (ref 22–32)
pCO2 arterial: 42.9 mmHg (ref 32–48)
pH, Arterial: 7.337 — ABNORMAL LOW (ref 7.35–7.45)
pO2, Arterial: 336 mmHg — ABNORMAL HIGH (ref 83–108)

## 2022-06-06 LAB — HEPARIN LEVEL (UNFRACTIONATED): Heparin Unfractionated: 0.11 IU/mL — ABNORMAL LOW (ref 0.30–0.70)

## 2022-06-06 LAB — HEMOGLOBIN AND HEMATOCRIT, BLOOD
HCT: 32.5 % — ABNORMAL LOW (ref 36.0–46.0)
Hemoglobin: 11.1 g/dL — ABNORMAL LOW (ref 12.0–15.0)

## 2022-06-06 LAB — PREPARE RBC (CROSSMATCH)

## 2022-06-06 LAB — HIV ANTIBODY (ROUTINE TESTING W REFLEX): HIV Screen 4th Generation wRfx: NONREACTIVE

## 2022-06-06 LAB — APTT: aPTT: 35 seconds (ref 24–36)

## 2022-06-06 SURGERY — WOUND EXPLORATION
Anesthesia: General | Site: Groin | Laterality: Left

## 2022-06-06 MED ORDER — ALUM & MAG HYDROXIDE-SIMETH 200-200-20 MG/5ML PO SUSP: 15.0000 mL | ORAL | Status: DC | PRN

## 2022-06-06 MED ORDER — PHENYLEPHRINE 80 MCG/ML (10ML) SYRINGE FOR IV PUSH (FOR BLOOD PRESSURE SUPPORT)
PREFILLED_SYRINGE | INTRAVENOUS | Status: AC
  Filled 2022-06-06: qty 10

## 2022-06-06 MED ORDER — POTASSIUM CHLORIDE CRYS ER 20 MEQ PO TBCR
40.0000 meq | EXTENDED_RELEASE_TABLET | Freq: Once | ORAL | Status: AC
  Administered 2022-06-06: 40 meq via ORAL
  Filled 2022-06-06: qty 2

## 2022-06-06 MED ORDER — PROPOFOL 10 MG/ML IV BOLUS
INTRAVENOUS | Status: DC | PRN
  Administered 2022-06-06: 120 mg via INTRAVENOUS

## 2022-06-06 MED ORDER — OXYCODONE HCL 5 MG PO TABS: 5.0000 mg | ORAL_TABLET | Freq: Four times a day (QID) | ORAL | Status: DC | PRN

## 2022-06-06 MED ORDER — ESMOLOL HCL 100 MG/10ML IV SOLN
INTRAVENOUS | Status: DC | PRN
  Administered 2022-06-06 (×2): 20 mg via INTRAVENOUS

## 2022-06-06 MED ORDER — HYDROMORPHONE HCL 1 MG/ML IJ SOLN: 1.0000 mg | INTRAMUSCULAR | Status: DC | PRN

## 2022-06-06 MED ORDER — PHENYLEPHRINE HCL-NACL 20-0.9 MG/250ML-% IV SOLN
INTRAVENOUS | Status: DC | PRN
  Administered 2022-06-06: 40 ug/min via INTRAVENOUS

## 2022-06-06 MED ORDER — CEFAZOLIN SODIUM-DEXTROSE 2-3 GM-%(50ML) IV SOLR
INTRAVENOUS | Status: DC | PRN
  Administered 2022-06-06: 2 g via INTRAVENOUS

## 2022-06-06 MED ORDER — HEPARIN (PORCINE) 25000 UT/250ML-% IV SOLN
1350.0000 [IU]/h | INTRAVENOUS | Status: DC
  Administered 2022-06-06: 850 [IU]/h via INTRAVENOUS
  Administered 2022-06-07: 1150 [IU]/h via INTRAVENOUS
  Administered 2022-06-08: 1350 [IU]/h via INTRAVENOUS
  Filled 2022-06-06 (×4): qty 250

## 2022-06-06 MED ORDER — LACTATED RINGERS IV SOLN: INTRAVENOUS | Status: DC | PRN

## 2022-06-06 MED ORDER — LISINOPRIL 20 MG PO TABS
40.0000 mg | ORAL_TABLET | Freq: Every day | ORAL | Status: DC
  Administered 2022-06-07: 40 mg via ORAL
  Filled 2022-06-06 (×2): qty 2

## 2022-06-06 MED ORDER — ONDANSETRON HCL 4 MG/2ML IJ SOLN: 4.0000 mg | Freq: Once | INTRAMUSCULAR | Status: DC | PRN

## 2022-06-06 MED ORDER — ONDANSETRON HCL 4 MG/2ML IJ SOLN
INTRAMUSCULAR | Status: DC | PRN
  Administered 2022-06-06: 4 mg via INTRAVENOUS

## 2022-06-06 MED ORDER — ESMOLOL HCL 100 MG/10ML IV SOLN
INTRAVENOUS | Status: AC
  Filled 2022-06-06: qty 10

## 2022-06-06 MED ORDER — CLOPIDOGREL BISULFATE 75 MG PO TABS
75.0000 mg | ORAL_TABLET | Freq: Every day | ORAL | Status: DC
  Administered 2022-06-06: 75 mg via ORAL
  Filled 2022-06-06: qty 1

## 2022-06-06 MED ORDER — 0.9 % SODIUM CHLORIDE (POUR BTL) OPTIME
TOPICAL | Status: DC | PRN
  Administered 2022-06-06: 2000 mL

## 2022-06-06 MED ORDER — SUCCINYLCHOLINE CHLORIDE 200 MG/10ML IV SOSY
PREFILLED_SYRINGE | INTRAVENOUS | Status: DC | PRN
  Administered 2022-06-06: 100 mg via INTRAVENOUS

## 2022-06-06 MED ORDER — ASPIRIN 81 MG PO TBEC
81.0000 mg | DELAYED_RELEASE_TABLET | Freq: Every day | ORAL | Status: DC
  Administered 2022-06-06 – 2022-06-09 (×4): 81 mg via ORAL
  Filled 2022-06-06 (×4): qty 1

## 2022-06-06 MED ORDER — SODIUM CHLORIDE 0.9% IV SOLUTION: Freq: Once | INTRAVENOUS | Status: DC

## 2022-06-06 MED ORDER — SUGAMMADEX SODIUM 200 MG/2ML IV SOLN
INTRAVENOUS | Status: DC | PRN
  Administered 2022-06-06: 200 mg via INTRAVENOUS

## 2022-06-06 MED ORDER — ALBUMIN HUMAN 5 % IV SOLN: INTRAVENOUS | Status: DC | PRN

## 2022-06-06 MED ORDER — PROPOFOL 10 MG/ML IV BOLUS
INTRAVENOUS | Status: AC
  Filled 2022-06-06: qty 20

## 2022-06-06 MED ORDER — HEPARIN 6000 UNIT IRRIGATION SOLUTION
Status: DC | PRN
  Administered 2022-06-06: 1

## 2022-06-06 MED ORDER — ACETAMINOPHEN 325 MG PO TABS
650.0000 mg | ORAL_TABLET | Freq: Four times a day (QID) | ORAL | Status: DC | PRN
  Administered 2022-06-06 – 2022-06-08 (×4): 650 mg via ORAL
  Filled 2022-06-06 (×4): qty 2

## 2022-06-06 MED ORDER — PHENYLEPHRINE 80 MCG/ML (10ML) SYRINGE FOR IV PUSH (FOR BLOOD PRESSURE SUPPORT)
PREFILLED_SYRINGE | INTRAVENOUS | Status: DC | PRN
  Administered 2022-06-06 (×2): 160 ug via INTRAVENOUS

## 2022-06-06 MED ORDER — BISACODYL 5 MG PO TBEC: 5.0000 mg | DELAYED_RELEASE_TABLET | Freq: Every day | ORAL | Status: DC | PRN

## 2022-06-06 MED ORDER — FENOFIBRATE 160 MG PO TABS
160.0000 mg | ORAL_TABLET | Freq: Every day | ORAL | Status: DC
  Administered 2022-06-06 – 2022-06-09 (×4): 160 mg via ORAL
  Filled 2022-06-06 (×4): qty 1

## 2022-06-06 MED ORDER — SUCCINYLCHOLINE CHLORIDE 200 MG/10ML IV SOSY
PREFILLED_SYRINGE | INTRAVENOUS | Status: AC
  Filled 2022-06-06: qty 10

## 2022-06-06 MED ORDER — CARVEDILOL 3.125 MG PO TABS
3.1250 mg | ORAL_TABLET | Freq: Two times a day (BID) | ORAL | Status: DC
  Administered 2022-06-06 – 2022-06-07 (×3): 3.125 mg via ORAL
  Filled 2022-06-06 (×3): qty 1

## 2022-06-06 MED ORDER — OXYCODONE HCL 5 MG PO TABS
5.0000 mg | ORAL_TABLET | Freq: Four times a day (QID) | ORAL | Status: DC | PRN
  Administered 2022-06-06 (×2): 5 mg via ORAL
  Filled 2022-06-06 (×2): qty 1

## 2022-06-06 MED ORDER — AMLODIPINE BESYLATE 2.5 MG PO TABS
2.5000 mg | ORAL_TABLET | Freq: Two times a day (BID) | ORAL | Status: DC
  Administered 2022-06-07: 2.5 mg via ORAL
  Filled 2022-06-06 (×2): qty 1

## 2022-06-06 MED ORDER — HYDROMORPHONE HCL 1 MG/ML IJ SOLN
1.0000 mg | INTRAMUSCULAR | Status: DC | PRN
  Administered 2022-06-06: 1 mg via SUBCUTANEOUS
  Filled 2022-06-06: qty 1

## 2022-06-06 MED ORDER — FENTANYL CITRATE (PF) 250 MCG/5ML IJ SOLN
INTRAMUSCULAR | Status: AC
  Filled 2022-06-06: qty 5

## 2022-06-06 MED ORDER — IOHEXOL 350 MG/ML SOLN
75.0000 mL | Freq: Once | INTRAVENOUS | Status: AC | PRN
  Administered 2022-06-06: 75 mL via INTRAVENOUS

## 2022-06-06 MED ORDER — LIDOCAINE 2% (20 MG/ML) 5 ML SYRINGE
INTRAMUSCULAR | Status: AC
  Filled 2022-06-06: qty 5

## 2022-06-06 MED ORDER — ACETAMINOPHEN 10 MG/ML IV SOLN
INTRAVENOUS | Status: AC
  Filled 2022-06-06: qty 100

## 2022-06-06 MED ORDER — ONDANSETRON HCL 4 MG/2ML IJ SOLN
INTRAMUSCULAR | Status: AC
  Filled 2022-06-06: qty 2

## 2022-06-06 MED ORDER — MIDAZOLAM HCL 2 MG/2ML IJ SOLN
INTRAMUSCULAR | Status: AC
  Filled 2022-06-06: qty 2

## 2022-06-06 MED ORDER — ONDANSETRON HCL 4 MG/2ML IJ SOLN: 4.0000 mg | Freq: Four times a day (QID) | INTRAMUSCULAR | Status: DC | PRN

## 2022-06-06 MED ORDER — FENTANYL CITRATE (PF) 100 MCG/2ML IJ SOLN
INTRAMUSCULAR | Status: DC | PRN
  Administered 2022-06-06: 100 ug via INTRAVENOUS
  Administered 2022-06-06: 50 ug via INTRAVENOUS

## 2022-06-06 MED ORDER — ROCURONIUM BROMIDE 100 MG/10ML IV SOLN
INTRAVENOUS | Status: DC | PRN
  Administered 2022-06-06: 30 mg via INTRAVENOUS

## 2022-06-06 MED ORDER — LIDOCAINE 2% (20 MG/ML) 5 ML SYRINGE
INTRAMUSCULAR | Status: DC | PRN
  Administered 2022-06-06: 80 mg via INTRAVENOUS

## 2022-06-06 MED ORDER — FENTANYL CITRATE (PF) 100 MCG/2ML IJ SOLN
25.0000 ug | INTRAMUSCULAR | Status: DC | PRN
  Administered 2022-06-06: 50 ug via INTRAVENOUS

## 2022-06-06 MED ORDER — ACETAMINOPHEN 10 MG/ML IV SOLN
INTRAVENOUS | Status: DC | PRN
  Administered 2022-06-06: 1000 mg via INTRAVENOUS

## 2022-06-06 MED ORDER — FENTANYL CITRATE (PF) 100 MCG/2ML IJ SOLN
INTRAMUSCULAR | Status: AC
  Filled 2022-06-06: qty 2

## 2022-06-06 SURGICAL SUPPLY — 32 items
BAG COUNTER SPONGE SURGICOUNT (BAG) ×2 IMPLANT
BAG SPNG CNTER NS LX DISP (BAG) ×2
CANISTER SUCT 3000ML PPV (MISCELLANEOUS) ×2 IMPLANT
COVER SURGICAL LIGHT HANDLE (MISCELLANEOUS) ×2 IMPLANT
DRESSING PEEL AND PLAC PRVNA20 (GAUZE/BANDAGES/DRESSINGS) IMPLANT
DRSG PEEL AND PLACE PREVENA 20 (GAUZE/BANDAGES/DRESSINGS) ×2
ELECT REM PT RETURN 9FT ADLT (ELECTROSURGICAL) ×2
ELECTRODE REM PT RTRN 9FT ADLT (ELECTROSURGICAL) ×2 IMPLANT
GLOVE BIO SURGEON STRL SZ7.5 (GLOVE) ×2 IMPLANT
GLOVE BIOGEL PI IND STRL 8 (GLOVE) ×2 IMPLANT
GLOVE SRG 8 PF TXTR STRL LF DI (GLOVE) ×2 IMPLANT
GLOVE SURG POLYISO LF SZ8 (GLOVE) IMPLANT
GLOVE SURG UNDER POLY LF SZ8 (GLOVE) ×2
GOWN STRL REUS W/ TWL LRG LVL3 (GOWN DISPOSABLE) ×6 IMPLANT
GOWN STRL REUS W/TWL 2XL LVL3 (GOWN DISPOSABLE) ×4 IMPLANT
GOWN STRL REUS W/TWL LRG LVL3 (GOWN DISPOSABLE) ×6
KIT BASIN OR (CUSTOM PROCEDURE TRAY) ×2 IMPLANT
KIT DRSG PREVENA PLUS 7DAY 125 (MISCELLANEOUS) IMPLANT
KIT TURNOVER KIT B (KITS) ×2 IMPLANT
NS IRRIG 1000ML POUR BTL (IV SOLUTION) ×4 IMPLANT
PACK GENERAL/GYN (CUSTOM PROCEDURE TRAY) ×2 IMPLANT
PAD ARMBOARD 7.5X6 YLW CONV (MISCELLANEOUS) ×4 IMPLANT
POWDER MYRIAD MORCELLS 1000MG (Miscellaneous) IMPLANT
SPONGE T-LAP 18X18 ~~LOC~~+RFID (SPONGE) IMPLANT
STAPLER VISISTAT 35W (STAPLE) IMPLANT
SUT PROLENE 5 0 C 1 24 (SUTURE) IMPLANT
SUT VIC AB 2-0 CT1 27 (SUTURE) ×4
SUT VIC AB 2-0 CT1 TAPERPNT 27 (SUTURE) IMPLANT
SUT VIC AB 3-0 SH 27 (SUTURE) ×2
SUT VIC AB 3-0 SH 27XBRD (SUTURE) IMPLANT
TOWEL GREEN STERILE (TOWEL DISPOSABLE) ×2 IMPLANT
WATER STERILE IRR 1000ML POUR (IV SOLUTION) ×4 IMPLANT

## 2022-06-06 NOTE — Progress Notes (Addendum)
Progress Note    06/06/2022 7:16 AM Day of Surgery  Subjective:  feels better but tired of laying in bed  Afebrile   Vitals:   06/06/22 0625 06/06/22 0641  BP: (!) 105/51   Pulse:    Resp:    Temp: 98 F (36.7 C) 98 F (36.7 C)  SpO2:      Physical Exam: Cardiac:  regular Lungs:  non labored Incisions:  left groin with wound vac with good seal; significant ecchymosis present over left groin, thigh and pubis area.  Extremities:  bilateral feet are warm and well perfused.  Left calf is soft and non tender   CBC    Component Value Date/Time   WBC 9.3 06/06/2022 0619   RBC 2.86 (L) 06/06/2022 0619   HGB 8.4 (L) 06/06/2022 0619   HGB 13.8 05/23/2022 1004   HGB 12.9 09/22/2013 1213   HCT 24.5 (L) 06/06/2022 0619   HCT 40.4 05/23/2022 1004   HCT 38.4 09/22/2013 1213   PLT 331 06/06/2022 0619   PLT 496 (H) 05/23/2022 1004   MCV 85.7 06/06/2022 0619   MCV 87 05/23/2022 1004   MCV 87.7 09/22/2013 1213   MCH 29.4 06/06/2022 0619   MCHC 34.3 06/06/2022 0619   RDW 14.6 06/06/2022 0619   RDW 12.2 05/23/2022 1004   RDW 13.2 09/22/2013 1213   LYMPHSABS 2.1 06/05/2022 2000   LYMPHSABS 2.6 11/15/2021 1133   LYMPHSABS 2.7 09/22/2013 1213   MONOABS 1.0 06/05/2022 2000   MONOABS 0.5 09/22/2013 1213   EOSABS 0.2 06/05/2022 2000   EOSABS 0.2 11/15/2021 1133   BASOSABS 0.0 06/05/2022 2000   BASOSABS 0.0 11/15/2021 1133   BASOSABS 0.1 09/22/2013 1213    BMET    Component Value Date/Time   NA 136 06/06/2022 0619   NA 142 05/23/2022 1004   NA 142 09/22/2013 1213   K 3.7 06/06/2022 0619   K 3.7 09/22/2013 1213   CL 105 06/06/2022 0619   CL 105 09/16/2012 0821   CO2 22 06/06/2022 0619   CO2 24 09/22/2013 1213   GLUCOSE 111 (H) 06/06/2022 0619   GLUCOSE 93 09/22/2013 1213   GLUCOSE 82 09/16/2012 0821   BUN 6 (L) 06/06/2022 0619   BUN 6 (L) 05/23/2022 1004   BUN 7.6 09/22/2013 1213   CREATININE 0.74 06/06/2022 0619   CREATININE 0.7 09/22/2013 1213   CALCIUM 8.6  (L) 06/06/2022 0619   CALCIUM 10.1 09/22/2013 1213   GFRNONAA >60 06/06/2022 0619   GFRAA 98 06/14/2020 0955    INR    Component Value Date/Time   INR 1.4 (H) 06/06/2022 0619     Intake/Output Summary (Last 24 hours) at 06/06/2022 0716 Last data filed at 06/06/2022 0608 Gross per 24 hour  Intake 1500 ml  Output 850 ml  Net 650 ml     Assessment/Plan:  66 y.o. female is s/p:  Left groin exploration Left groin hematoma evacuation Primary repair of left common femoral artery Myriad morcell biologic powder placement Prevena VAC dressing  Day of Surgery   -bilateral feet are warm and well perfused.  Left calf is soft -left groin with wound vac in place.  Extensive ecchymosis-discussed with pt that it will take quite some time for this to resolve.  Hopeful she does not have skin compromise.  Will continue to monitor.   -acute blood loss anemia-receiving transfusion currently.  Repeat hgb this am 8.4.   Leontine Locket, PA-C [facility] [phone] 06/06/2022 7:16 AM  VASCULAR  STAFF ADDENDUM: I agree with the above.  Plan to continue ASA, Heparin to resume at 12p with no bolus Hold plavix at this time, will restart in the coming days Signal DP and PT VAC dressing to remain in place for 7 days Bedrest today.  OOB tomorrow.   Cassandria Santee, MD [facility] Phone Number: [phone] 06/06/2022 7:42 AM

## 2022-06-06 NOTE — H&P (Signed)
Cardiology Admission History and Physical   Patient ID: [name] [name] MRN: [id number]; DOB: August 20, 1955   Admission date: 06/05/2022  PCP:  Billie Ruddy, MD   [facility] Cardiologist:  Glenetta Hew, MD  Cardiology APP:  Almyra Deforest, [place]       Chief Complaint:  leg bruising and pain  Patient Profile:   [name] [name] is a 66 y.o. female with a hx of HTN, CAD s/p LAD/Lcx PCI, lupus, history of DVT/PE on chronic anticoagulation who is being seen today for the evaluation of left groin hematoma at the request of emergency department.  History of Present Illness:  [name] [name] is a 66 yo F with HTN, CAD s/p LAD/Lcx PCI, lupus, history of DVT/PE on chronic anticoagulation who presents to the emergency department with 24 hours of worsening left groin hematoma.  She underwent left lower extremity SFA intervention with Dr. Gwenlyn Found on 06/02/2022.  Notably given her history of lupus anticoagulant, she was continued on aspirin, Plavix, and rivaroxaban.  Noted that she was doing well with the first 24 hours after intervention.  Notes that she had some discomfort in the leg and worsened throughout yesterday evening and into this morning.  Has not had any other symptoms and no pain below the thigh.  No fevers, chills, nausea, vomiting.  CT scan performed in the emergency department overnight revealed active bleeding from a left common femoral artery pseudoaneurysm and a large hematoma. Vascular surgery consulted by ED.    Past Medical History:  Diagnosis Date   Back pain    CAD S/P percutaneous coronary angioplasty 09/13/2018   09/13/2018 - Cath & Staged DES PCI on 09/14/2018: DES PCI: Cx99%&55% - Resolute Onyx DES 3.5 x 30 (3.6 mm), p-mLAD (prox of D1 almost to D2) - Resolute Onyx DES 3.0 x 22 (3.3 mm); DFR on pRCA 70% - 0.99, Not significant -> Rec Med Rx.   DVT (deep venous thrombosis) ([place])    Heart attack ([place]) 09/09/2018   Hypertension    Lupus anticoagulant  syndrome (HCC)    NSTEMI (non-ST elevated myocardial infarction) ([place]) 09/12/2018   Initial presentation with chest pain was on 09/09/2018, recurrent pain on 1/26 2020 with positive troponins.  Cath showed three-vessel CAD -> declined CABG, opted for two-vessel DES PCI (LAD and CX, negative DFR RCA)   Pulmonary embolism ([place])     Past Surgical History:  Procedure Laterality Date   ABDOMINAL AORTOGRAM W/LOWER EXTREMITY N/A 06/02/2022   Procedure: ABDOMINAL AORTOGRAM W/LOWER EXTREMITY;  Surgeon: Lorretta Harp, MD;  Location: [place] [facility];  Service: Cardiovascular;  Laterality: N/A;   BACK SURGERY     COLONOSCOPY  05/19/2012   Procedure: COLONOSCOPY;  Surgeon: Beryle Beams, MD;  Location: [place];  Service: Endoscopy;  Laterality: N/A;   COLONOSCOPY WITH PROPOFOL N/A 09/30/2018   Procedure: COLONOSCOPY WITH PROPOFOL;  Surgeon: Yetta Flock, MD;  Location: [facility];  Service: Gastroenterology;  Laterality: N/A;   CORONARY STENT INTERVENTION N/A 09/14/2018   Procedure: CORONARY STENT INTERVENTION;  Surgeon: Martinique, Peter M, MD;  Location: [facility];  Service: Cardiovascular: September 14, 2018- DES PCI: Cx99%&55% - Resolute Onyx DES 3.5 x 30 (3.6 mm), p-mLAD (prox of D1 almost to D2) - Resolute Onyx DES 3.0 x 22 (3.3 mm); DFR on pRCA 70% - 0.99, Not significant -> Rec Med Rx.   ESOPHAGOGASTRODUODENOSCOPY  05/19/2012   Procedure: ESOPHAGOGASTRODUODENOSCOPY (EGD);  Surgeon: Beryle Beams, MD;  Location: [facility]  ENDOSCOPY;  Service: Endoscopy;  Laterality: N/A;   ESOPHAGOGASTRODUODENOSCOPY (EGD) WITH PROPOFOL N/A 09/30/2018   Procedure: ESOPHAGOGASTRODUODENOSCOPY (EGD) WITH PROPOFOL;  Surgeon: Yetta Flock, MD;  Location: [place];  Service: Gastroenterology;  Laterality: N/A;   GIVENS CAPSULE STUDY N/A 09/30/2018   Procedure: GIVENS CAPSULE STUDY;  Surgeon: Yetta Flock, MD;  Location: [place];  Service: Gastroenterology;  Laterality: N/A;   HOT  HEMOSTASIS N/A 09/30/2018   Procedure: HOT HEMOSTASIS (ARGON PLASMA COAGULATION/BICAP);  Surgeon: Yetta Flock, MD;  Location: [facility] ENDOSCOPY;  Service: Gastroenterology;  Laterality: N/A;   INTRAVASCULAR PRESSURE WIRE/FFR STUDY N/A 09/14/2018   Procedure: INTRAVASCULAR PRESSURE WIRE/FFR STUDY;  Surgeon: Martinique, Peter M, MD;  Location: [place] [facility];  Service: Cardiovascular;  Laterality: RCA: DFR on pRCA 70% - 0.99, Not significant -> Rec Med Rx.   LEFT HEART CATH AND CORONARY ANGIOGRAPHY N/A 09/13/2018   Procedure: LEFT HEART CATH AND CORONARY ANGIOGRAPHY;  Surgeon: Leonie Man, MD;  Location: [place] [facility];  Service: Cardiovascular:: CULPRIT LESION 99% p-mCx followed by 55%mCx; pLAD 35% A D38fllowed by long 80% lesion @ SP1); pRCA 70%. Normal LVEDP. Global HK EF ~45%.  -Decision on PCI deferred until discussion about PCI versus CABG.  Concern because of long-term DOAC.   PERIPHERAL VASCULAR ATHERECTOMY  06/02/2022   Procedure: PERIPHERAL VASCULAR ATHERECTOMY;  Surgeon: BLorretta Harp MD;  [facility];  Service: Cardiovascular;;   PERIPHERAL VASCULAR BALLOON ANGIOPLASTY  06/02/2022   Procedure: PERIPHERAL VASCULAR BALLOON ANGIOPLASTY;  Surgeon: BLorretta Harp MD;  [facility];  Service: Cardiovascular;;   TRANSTHORACIC ECHOCARDIOGRAM  09/13/2018   Mildly reduced EF 45 to 50% with diffuse HK.  GR 1 DD.  Mild aortic valve calcification.  MAC.  Moderate pulmonic regurgitation.     Medications Prior to Admission: Prior to Admission medications   Medication Sig Start Date End Date Taking? Authorizing Provider  acetaminophen (TYLENOL) 500 MG tablet Take 500 mg by mouth daily as needed for mild pain or moderate pain.   Yes [provider]  acetaminophen (TYLENOL) 650 MG CR tablet Take 650 mg by mouth daily as needed for pain.   Yes [provider]  amLODipine (NORVASC) 5 MG tablet TAKE 1 TABLET BY MOUTH TWICE A DAY Patient  taking differently: Take 2.5 mg by mouth 2 (two) times daily. 04/28/22  Yes HLeonie Man MD  aspirin EC 81 MG tablet Take 1 tablet (81 mg total) by mouth daily. Swallow whole. 06/04/22  Yes RCheryln Manly NP  bisacodyl (DULCOLAX) 5 MG EC tablet Take 5 mg by mouth daily as needed for moderate constipation.   Yes [provider]  carvedilol (COREG) 6.25 MG tablet Take 0.5 tablets (3.125 mg total) by mouth 2 (two) times daily with a meal. 08/16/21  Yes HLeonie Man MD  clopidogrel (PLAVIX) 75 MG tablet Take 1 tablet (75 mg total) by mouth daily. 05/23/22  Yes BLorretta Harp MD  fenofibrate (TRICOR) 145 MG tablet TAKE 1 TABLET (145 MG TOTAL) BY MOUTH DAILY. TAKE WITH A MEAL. Patient taking differently: Take 145 mg by mouth daily. 09/16/21  Yes HLeonie Man MD  lisinopril (ZESTRIL) 40 MG tablet TAKE 1 TABLET BY MOUTH EVERY DAY Patient taking differently: Take 40 mg by mouth daily. 05/13/22  Yes BBillie Ruddy MD  nitroGLYCERIN (NITROSTAT) 0.4 MG SL tablet Place 1 tablet (0.4 mg total) under the tongue every 5 (five) minutes as needed for  up to 3 doses for chest pain. 07/19/20  Yes Leonie Man, MD  rivaroxaban (XARELTO) 20 MG TABS tablet Take 1 tablet (20 mg total) by mouth daily with supper. 06/04/22  Yes Lorretta Harp, MD     Allergies:    Allergies  Allergen Reactions   Prednisone Other (See Comments)    No energy "stalls out".    Makes pt  Jittery.   Tramadol Other (See Comments)    Constipation,  Makes pt  Jittery.   Crestor [Rosuvastatin Calcium]     Feels fatigued, RE  CHALLENGE - UNABLE TO USE 12/28/18   Hctz [Hydrochlorothiazide]     Dizzy    Repatha [Evolocumab] Other (See Comments)    Per patient never took this medication    Social History:   Social History   Socioeconomic History   Marital status: Married    Spouse name: Not on file   Number of children: 2   Years of education: Not on file   Highest education level: Not on file   Occupational History   Occupation: SERVICE MANAGER    Employer: [employer]  Tobacco Use   Smoking status: Every Day    Packs/day: 0.50    Years: 29.00    Total pack years: 14.50    Types: Cigarettes   Smokeless tobacco: Never   Tobacco comments:    Cut back to quarter pack a day  Vaping Use   Vaping Use: Never used  Substance and Sexual Activity   Alcohol use: Yes    Comment: Occasional   Drug use: No   Sexual activity: Not on file  Other Topics Concern   Not on file  Social History Narrative   Really is a married mother of 2.  1 son died from complications of spina bifida after multiple surgeries.   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     She has a has a Oceanographer in education.     Drinks social alcohol & denies drug use.     Pt endorses smoking maybe 2 cigarettes/day.  States she placed most of the cigarette, may have 2 puffs.   Social Determinants of Health   Financial Resource Strain: Low Risk  (10/31/2021)   Overall Financial Resource Strain (CARDIA)    Difficulty of Paying Living Expenses: Not hard at all  Food Insecurity: No Food Insecurity (10/31/2021)   Hunger Vital Sign    Worried About Running Out of Food in the Last Year: Never true    Ran Out of Food in the Last Year: Never true  Transportation Needs: No Transportation Needs (10/31/2021)   PRAPARE - Hydrologist (Medical): No    Lack of Transportation (Non-Medical): No  Physical Activity: Inactive (10/31/2021)   Exercise Vital Sign    Days of Exercise per Week: 0 days    Minutes of Exercise per Session: 0 min  Stress: No Stress Concern Present (10/31/2021)   [place]    Feeling of Stress : Not at all  Social Connections: [place] (10/31/2021)   Social Connection and Isolation Panel [NHANES]    Frequency of Communication with Friends and Family: More than  three times a week    Frequency of Social Gatherings with Friends and Family: More than three times a week    Attends Religious Services: More than 4 times per year    Active Member  of Clubs or Organizations: Yes    Attends Archivist Meetings: More than 4 times per year    Marital Status: Married  Human resources officer Violence: Not At Risk (10/31/2021)   Humiliation, Afraid, Rape, and Kick questionnaire    Fear of Current or Ex-Partner: No    Emotionally Abused: No    Physically Abused: No    Sexually Abused: No    Family History:   The patient's family history includes Cancer in her father and paternal grandfather; Leukemia in her mother and paternal grandmother; Lung cancer in her maternal grandfather; Prostate cancer in her father; Spina bifida in her son; Stroke in her maternal grandmother. There is no history of Colon cancer or Esophageal cancer.    ROS:  Please see the history of present illness.  All other ROS reviewed and negative.     Physical Exam/Data:   Vitals:   06/05/22 2353 06/06/22 0000 06/06/22 0030 06/06/22 0045  BP:  133/69 133/62 133/74  Pulse:  90 87 82  Resp:  _0 Temp: 98.4 F (36.9 C)     TempSrc: Oral     SpO2:  97% 96% 99%  Weight:      Height:       No intake or output data in the 24 hours ending 06/06/22 0215    06/05/2022    1:35 PM 06/02/2022   10:06 AM 05/23/2022    8:28 AM  Last 3 Weights  Weight (lbs) 149 lb 14.6 oz 150 lb 150 lb  Weight (kg) 68 kg 68.04 kg 68.04 kg     Body mass index is 25.34 kg/m.  General: Mildly uncomfortable HEENT: normal Lymph: no adenopathy Neck: no JVD Endocrine:  No thryomegaly Vascular: No carotid bruits; FA pulses 2+ on right and 1+ on left y without bruits  Cardiac:  normal S1, S2; RRR; no murmur  Lungs:  clear to auscultation bilaterally, no wheezing, rhonchi or rales  Abd: soft, nontender, no hepatomegaly  Ext: Very large, tense and painful hematoma in the left groin and left abdomen and  in the pelvic area.  Large amount of ecchymoses around her abdomen and thigh. Musculoskeletal:  No deformities, BUE and BLE strength normal and equal Skin: warm and dry  Neuro:  CNs 2-12 intact, no focal abnormalities noted Psych:  Normal affect    Relevant CV Studies: Lower extremity angiography / PVI 06/02/2022: Final Impression: Successful Hawk 1 directional atherectomy followed by [facility] using spider distal protection of a short segment right above-the-knee popliteal artery CTO for claudication.  Patient does have lupus anticoagulant.  She was difficult to anticoagulate.  We will restart Lovenox bridge tonight and Xarelto tomorrow.  She will need to be on "triple therapy" for 1 month after which we will discontinue the aspirin.  The sheath will be removed once ACT falls below 170 pressure held.  She will be hydrated overnight and discharged home in the morning.  We will obtain lower extremity arterial Doppler studies in unrefined office next week and I will see her back the week after.  Laboratory Data:  High Sensitivity Troponin:  No results for input(s): "TROPONINIHS" in the last 720 hours.    Chemistry Recent Labs  Lab 06/03/22 0519 06/05/22 2000  NA 138 137  K 3.6 3.2*  CL 108 102  CO2 22 22  GLUCOSE 98 120*  BUN 8 6*  CREATININE 0.73 0.79  CALCIUM 8.4* 9.5  GFRNONAA >60 >60  ANIONGAP 8 13  Recent Labs  Lab 06/05/22 2000  PROT 6.4*  ALBUMIN 3.4*  AST 19  ALT 17  ALKPHOS 48  BILITOT 0.6   Lipids  Recent Labs  Lab 06/03/22 0519  CHOL 207*  TRIG 327*  HDL 26*  LDLCALC 116*  CHOLHDL 8.0   Hematology Recent Labs  Lab 06/05/22 2000 06/06/22 0035  WBC 12.5* 10.5  RBC 3.61* 3.31*  HGB 10.8* 9.8*  HCT 32.5* 30.5*  MCV 90.0 92.1  MCH 29.9 29.6  MCHC 33.2 32.1  RDW 12.8 13.0  PLT 414* 392   Thyroid No results for input(s): "TSH", "FREET4" in the last 168 hours. BNPNo results for input(s): "BNP", "PROBNP" in the last 168 hours.  DDimer No results for  input(s): "DDIMER" in the last 168 hours.   Radiology/Studies:  CT Angio Pelvis W and/or Wo Contrast  Result Date: 06/06/2022 CLINICAL DATA:  Abdominal pain; postop catheterization through left groin significant bruising and firmness around the belly mons pubis and upper thigh EXAM: CTA PELVIS WITH CONTRAST TECHNIQUE: Multidetector CT imaging of the pelvis was performed using the standard protocol following the bolus administration of intravenous contrast. RADIATION DOSE REDUCTION: This exam was performed according to the departmental dose-optimization program which includes automated exposure control, adjustment of the mA and/or kV according to patient size and/or use of iterative reconstruction technique. CONTRAST:  63m OMNIPAQUE IOHEXOL 350 MG/ML SOLN COMPARISON:  None Available. FINDINGS: Urinary Tract:  No abnormality visualized. Bowel:  Unremarkable visualized pelvic bowel loops. Vascular/Lymphatic: Left common femoral pseudoaneurysm measuring 7 mm (series 5/image 90 with active contrast extravasation into a 6.9 cm hematoma within the left inguinal region (series 5/image 86. Marked edema within the left flank extending into the midline abdomen and mons pubis. No suspicious lymphadenopathy.  Bilateral common iliac vein stents. Reproductive:  No mass or other significant abnormality Other:  None. Musculoskeletal: Partially visualized lumbar spine fusion. No acute abnormality. IMPRESSION: Pseudoaneurysm with active bleeding from the left common femoral artery access site into a 6.9 cm left inguinal hematoma. These results were called by telephone at the time of interpretation on 06/06/2022 at 1:17 am to provider RILEY RANSOM , who verbally acknowledged these results. Electronically Signed   By: TPlacido SouM.D.   On: 06/06/2022 01:18   VAS [place]OIN PSEUDOANEURYSM  Result Date: 06/05/2022  ARTERIAL PSEUDOANEURYSM  Patient Name:  Adlean WNYSHA KOPLIN Date of Exam:   06/05/2022 Medical Rec #:  [id number]           Accession #:    [id number] of Birth: 106-30-1957           Patient Gender: F Patient Age:   67years [facility]      VAS [place] PSEUDOANEURYSM Referring Phys: LCherlynn June--------------------------------------------------------------------------------  Exam: Left groin Indications: Patient complains of groin pain and bruising. Limitations: poor ultrasound/tissue interface Comparison Study: No prior studies. Performing Technologist: GOliver HumRVT  Examination Guidelines: A complete evaluation includes B-mode imaging, spectral Doppler, color Doppler, and power Doppler as needed of all accessible portions of each vessel. Bilateral testing is considered an integral part of a complete examination. Limited examinations for reoccurring indications may be performed as noted. +-----------+----------+-----------+------+----------+ Left DuplexPSV (cm/s) Waveform  PlaqueComment(s) +-----------+----------+-----------+------+----------+ CFA                  multiphasic                 +-----------+----------+-----------+------+----------+ Prox SFA  Multiphasic                 +-----------+----------+-----------+------+----------+ Left Vein comments:  Summary: Negative for obvious evidence of left groin pseudoaneurysm. Large anechoic area suggestive of hematoma is noted in the left groin.   --------------------------------------------------------------------------------    Preliminary      Assessment and Plan:    # Left common femoral artery pseudoaneurysm with active bleeding hematoma # Lower extremity PAD s/p L SFA intervention 06/02/2022 Progressive hemotoma and pain post procedure, found to have active bleed on arrival to ED. Vascular surgery consulted and now undergoing left thigh exploration for bleed. - appreciate vascular surgery management and recs - holding anticoagulation for now  - continue ASA/plavix   #  Coronary Artery Disease with prior PCI to LAD/Lcx No recent intervention (on plavix for PAD intervention).  -asa/plavix as above  - fenofibrate   # HTN  - will restart BP meds post OR - on amlodipine, carvedilol, lisinopril   # History of DVT/PE #Lupus anticoagulant High propensity for clotting, significant in the past. Holding anticoagulation as above but will need to start heparin as soon as possible per vascular surgery recs - hold rivaroxaban  Risk Assessment/Risk Scores:            Severity of Illness: The appropriate patient status for this patient is INPATIENT. Inpatient status is judged to be reasonable and necessary in order to provide the required intensity of service to ensure the patient's safety. The patient's presenting symptoms, physical exam findings, and initial radiographic and laboratory data in the context of their chronic comorbidities is felt to place them at high risk for further clinical deterioration. Furthermore, it is not anticipated that the patient will be medically stable for discharge from the hospital within 2 midnights of admission.   * I certify that at the point of admission it is my clinical judgment that the patient will require inpatient hospital care spanning beyond 2 midnights from the point of admission due to high intensity of service, high risk for further deterioration and high frequency of surveillance required.*   For questions or updates, please contact [place] Please consult [email or web address] for contact info under     Signed, Doyne Keel, MD  06/06/2022 2:15 AM

## 2022-06-06 NOTE — Progress Notes (Signed)
ANTICOAGULATION CONSULT NOTE - Initial Consult  Pharmacy Consult for Heparin (Xarelto on hold) Indication: Lupus anti-coagulant, history of VTE  Allergies  Allergen Reactions   Prednisone Other (See Comments)    No energy "stalls out".    Makes pt  Jittery.   Tramadol Other (See Comments)    Constipation,  Makes pt  Jittery.   Crestor [Rosuvastatin Calcium]     Feels fatigued, RE  CHALLENGE - UNABLE TO USE 12/28/18   Hctz [Hydrochlorothiazide]     Dizzy    Repatha [Evolocumab] Other (See Comments)    Per patient never took this medication    Patient Measurements: Height: 5' 4.5" (163.8 cm) Weight: 68 kg (149 lb 14.6 oz) IBW/kg (Calculated) : 55.85   Vital Signs: Temp: 98.1 F (36.7 C) (10/20 0430) Temp Source: Oral (10/19 2353) BP: 145/70 (10/20 0428) Pulse Rate: 92 (10/20 0445)  Labs: Recent Labs    06/03/22 0519 06/05/22 2000 06/06/22 0035  HGB 11.1* 10.8* 9.8*  HCT 33.0* 32.5* 30.5*  PLT 381 414* 392  APTT 61*  --   --   HEPARINUNFRC 0.31  --   --   CREATININE 0.73 0.79  --     Estimated Creatinine Clearance: 65.4 mL/min (by C-G formula based on SCr of 0.79 mg/dL).   Medical History: Past Medical History:  Diagnosis Date   Back pain    CAD S/P percutaneous coronary angioplasty 09/13/2018   09/13/2018 - Cath & Staged DES PCI on 09/14/2018: DES PCI: Cx99%&55% - Resolute Onyx DES 3.5 x 30 (3.6 mm), p-mLAD (prox of D1 almost to D2) - Resolute Onyx DES 3.0 x 22 (3.3 mm); DFR on pRCA 70% - 0.99, Not significant -> Rec Med Rx.   DVT (deep venous thrombosis) ([place])    Heart attack ([place]) 09/09/2018   Hypertension    Lupus anticoagulant syndrome (HCC)    NSTEMI (non-ST elevated myocardial infarction) ([place]) 09/12/2018   Initial presentation with chest pain was on 09/09/2018, recurrent pain on 1/26 2020 with positive troponins.  Cath showed three-vessel CAD -> declined CABG, opted for two-vessel DES PCI (LAD and CX, negative DFR RCA)   Pulmonary embolism (HCC)      Assessment: 66 y/o F on Xarelto PTA for lupus anti-coagulant and history of VTE. Recent SFA intervention of 10/16. Found to have pseudoaneurysm and active bleeding. Pt now s/p OR with vascular. Consulted to start pt on heparin at 1200 today. Last Xarelto dose 10/18 at 2030.    Goal of Therapy:  Heparin level 0.3-0.5 units/ml aPTT 66-84 seconds Monitor platelets by anticoagulation protocol: Yes   Plan:  Start heparin drip at 850 units/hr at 1200 today 2000 aPTT and heparin level Daily CBC, aPTT, heparin level Monitor closely for any further bleeding  Narda Bonds, PharmD, BCPS Clinical Pharmacist Phone: [phone]

## 2022-06-06 NOTE — Op Note (Signed)
    NAME: [name] [name]    MRN: [id number] DOB: [birth year]    DATE OF OPERATION: 06/06/2022  PREOP DIAGNOSIS:    Left common femoral artery pseudoaneurysm, active hemorrhage  POSTOP DIAGNOSIS:    Same  PROCEDURE:    Left groin exploration Left groin hematoma evacuation Primary repair of left common femoral artery Myriad morcell biologic powder placement Prevena VAC dressing  SURGEON: Broadus John  ASSIST: Luisa Dago, PA  ANESTHESIA: General  EBL: 300 mL  INDICATIONS:    [name] [name] is a 66 y.o. female with known clotting disorder who presented status post right lower extremity peripheral vascular intervention with left-sided common femoral artery access site pseudoaneurysm that had an active bleed.  I discussed the risk and benefits of left groin exploration, common femoral artery repair, redo elected to proceed.  FINDINGS:   Common femoral artery 6 French arteriotomy Large left groin hematoma   TECHNIQUE:   Patient was brought to the OR laid in supine position.  General anesthesia was induced and the patient was prepped draped in standard fashion.  The case began with an oblique incision at the area of ecchymosis and hematoma over lying the left common femoral artery.  A large hematoma was encountered and this was evacuated.  The wound bed was irrigated with saline, and an active bleed was appreciated from the common femoral artery.  The common femoral artery was friable.  Three interrupted 5-0 Prolene sutures were used to control the arteriotomy.  Once controlled, a Doppler ultrasound probe was brought onto the field and the common femoral artery was insonated both proximally and distally.  This signal was unchanged.  There was a palpable pulse both proximally and distally as well.  There is a posterior tibial signal in the foot, and therefore I elected to begin closing the left groin wound.  The wound was irrigated with copious amounts of saline.   Hemostasis was achieved with use of cautery.  Once the wound bed was dry, myriad morcell powder was placed in the wound bed due to the significant soft tissue defect.  The wound was closed in multiple layers using running 2-0 Vicryl suture.  Staples were placed at the skin with a Prevena VAC dressing.  The dressing will remain in place for 7 days.    Macie Burows, MD [facility] DATE OF DICTATION:   06/06/2022

## 2022-06-06 NOTE — Anesthesia Procedure Notes (Signed)
Procedure Name: Intubation Date/Time: 06/06/2022 2:57 AM  Performed by: Jadee Golebiewski T, CRNAPre-anesthesia Checklist: Patient identified, Emergency Drugs available, Suction available and Patient being monitored Patient Re-evaluated:Patient Re-evaluated prior to induction Oxygen Delivery Method: Circle system utilized Preoxygenation: Pre-oxygenation with 100% oxygen Induction Type: IV induction, Rapid sequence and Cricoid Pressure applied Ventilation: Mask ventilation without difficulty Laryngoscope Size: Mac and 3 Grade View: Grade I Tube type: Oral Tube size: 7.5 mm Number of attempts: 1 Airway Equipment and Method: Stylet and Oral airway Placement Confirmation: ETT inserted through vocal cords under direct vision, positive ETCO2 and breath sounds checked- equal and bilateral Secured at: 21 cm Tube secured with: Tape Dental Injury: Teeth and Oropharynx as per pre-operative assessment

## 2022-06-06 NOTE — Progress Notes (Signed)
Paged vascular surgery, spoke with Dr. Donzetta Matters, updated on fever of 103.7 rectally and orders placed per cardiology.  No new orders given.

## 2022-06-06 NOTE — TOC Benefit Eligibility Note (Signed)
Patient Teacher, English as a foreign language completed.    The patient is currently admitted and upon discharge could be taking Eliquis '5mg'$ .  The current 30 day co-pay is $149.97.   The patient is insured through [employer] Folk, [place] Patient Advocate Specialist [place] Patient Advocate Team Direct Number: [phone] Fax: [phone]

## 2022-06-06 NOTE — Progress Notes (Addendum)
Rounding Note    Patient Name: [name] [name] Date of Encounter: 06/06/2022  [place] Cardiologist: Glenetta Hew, MD   Subjective   Patient appears tired but comfortable in bed. She is s/p emergent vascular surgery this morning for actively bleeding left common femoral pseudoaneurysm. Denies significant groin or leg pain.  Inpatient Medications    Scheduled Meds:  sodium chloride   Intravenous Once   amLODipine  2.5 mg Oral BID   aspirin EC  81 mg Oral Daily   carvedilol  3.125 mg Oral BID WC   clopidogrel  75 mg Oral Daily   fenofibrate  160 mg Oral Daily   fentaNYL       lisinopril  40 mg Oral Daily   Continuous Infusions:  heparin     PRN Meds: acetaminophen, alum & mag hydroxide-simeth, bisacodyl, fentaNYL, HYDROmorphone (DILAUDID) injection, ondansetron, oxyCODONE   Vital Signs    Vitals:   06/06/22 0607 06/06/22 0625 06/06/22 0641 06/06/22 0757  BP: 107/61 (!) 105/51  (!) 106/48  Pulse:    85  Resp: 12   19  Temp: 98 F (36.7 C) 98 F (36.7 C) 98 F (36.7 C) 98.2 F (36.8 C)  TempSrc: Oral Oral Oral Oral  SpO2:    95%  Weight:      Height:        Intake/Output Summary (Last 24 hours) at 06/06/2022 0800 Last data filed at 06/06/2022 0757 Gross per 24 hour  Intake 1865 ml  Output 850 ml  Net 1015 ml      06/05/2022    1:35 PM 06/02/2022   10:06 AM 05/23/2022    8:28 AM  Last 3 Weights  Weight (lbs) 149 lb 14.6 oz 150 lb 150 lb  Weight (kg) 68 kg 68.04 kg 68.04 kg      Telemetry    Sinus rhythm - Personally Reviewed  ECG    No new tracing  Physical Exam   GEN: No acute distress.   Neck: No JVD Cardiac: RRR, no murmurs, rubs, or gallops.  Respiratory: Clear to auscultation bilaterally. GI: Soft, nontender, non-distended  MS: left groin with ecchymosis across to pubis and down proximal thigh, wound vac in place. Strong distal pulses. Bilateral lower extremities warm and well-perfused. Neuro:  Nonfocal  Psych:  Normal affect   Labs    High Sensitivity Troponin:  No results for input(s): "TROPONINIHS" in the last 720 hours.   Chemistry Recent Labs  Lab 06/03/22 0519 06/05/22 2000 06/06/22 0338 06/06/22 0619  NA 138 137 136 136  K 3.6 3.2* 3.6 3.7  CL 108 102  --  105  CO2 22 22  --  22  GLUCOSE 98 120*  --  111*  BUN 8 6*  --  6*  CREATININE 0.73 0.79  --  0.74  CALCIUM 8.4* 9.5  --  8.6*  PROT  --  6.4*  --   --   ALBUMIN  --  3.4*  --   --   AST  --  19  --   --   ALT  --  17  --   --   ALKPHOS  --  48  --   --   BILITOT  --  0.6  --   --   GFRNONAA >60 >60  --  >60  ANIONGAP 8 13  --  9    Lipids  Recent Labs  Lab 06/03/22 0519  CHOL 207*  TRIG 327*  HDL  26*  LDLCALC 116*  CHOLHDL 8.0    Hematology Recent Labs  Lab 06/05/22 2000 06/06/22 0035 06/06/22 0338 06/06/22 0619  WBC 12.5* 10.5  --  9.3  RBC 3.61* 3.31*  --  2.86*  HGB 10.8* 9.8* 7.5* 8.4*  HCT 32.5* 30.5* 22.0* 24.5*  MCV 90.0 92.1  --  85.7  MCH 29.9 29.6  --  29.4  MCHC 33.2 32.1  --  34.3  RDW 12.8 13.0  --  14.6  PLT 414* 392  --  331   Thyroid No results for input(s): "TSH", "FREET4" in the last 168 hours.  BNPNo results for input(s): "BNP", "PROBNP" in the last 168 hours.  DDimer No results for input(s): "DDIMER" in the last 168 hours.   Radiology    CT Angio Pelvis W and/or Wo Contrast  Result Date: 06/06/2022 CLINICAL DATA:  Abdominal pain; postop catheterization through left groin significant bruising and firmness around the belly mons pubis and upper thigh EXAM: CTA PELVIS WITH CONTRAST TECHNIQUE: Multidetector CT imaging of the pelvis was performed using the standard protocol following the bolus administration of intravenous contrast. RADIATION DOSE REDUCTION: This exam was performed according to the departmental dose-optimization program which includes automated exposure control, adjustment of the mA and/or kV according to patient size and/or use of iterative reconstruction technique.  CONTRAST:  37m OMNIPAQUE IOHEXOL 350 MG/ML SOLN COMPARISON:  None Available. FINDINGS: Urinary Tract:  No abnormality visualized. Bowel:  Unremarkable visualized pelvic bowel loops. Vascular/Lymphatic: Left common femoral pseudoaneurysm measuring 7 mm (series 5/image 90 with active contrast extravasation into a 6.9 cm hematoma within the left inguinal region (series 5/image 86. Marked edema within the left flank extending into the midline abdomen and mons pubis. No suspicious lymphadenopathy.  Bilateral common iliac vein stents. Reproductive:  No mass or other significant abnormality Other:  None. Musculoskeletal: Partially visualized lumbar spine fusion. No acute abnormality. IMPRESSION: Pseudoaneurysm with active bleeding from the left common femoral artery access site into a 6.9 cm left inguinal hematoma. These results were called by telephone at the time of interpretation on 06/06/2022 at 1:17 am to provider RILEY RANSOM , who verbally acknowledged these results. Electronically Signed   By: TPlacido SouM.D.   On: 06/06/2022 01:18   VAS [place] PSEUDOANEURYSM  Result Date: 06/05/2022  ARTERIAL PSEUDOANEURYSM  Patient Name:  [name] [name] Date of Exam:   06/05/2022 Medical Rec #: [id number]           Accession #:    [id number] of Birth: 1November 20, 1957           Patient Gender: F Patient Age:   89years [facility] [place] PSEUDOANEURYSM Referring Phys: LCherlynn June--------------------------------------------------------------------------------  Exam: Left groin Indications: Patient complains of groin pain and bruising. Limitations: poor ultrasound/tissue interface Comparison Study: No prior studies. Performing Technologist: GOliver HumRVT  Examination Guidelines: A complete evaluation includes B-mode imaging, spectral Doppler, color Doppler, and power Doppler as needed of all accessible portions of each vessel. Bilateral testing is considered  an integral part of a complete examination. Limited examinations for reoccurring indications may be performed as noted. +-----------+----------+-----------+------+----------+ Left DuplexPSV (cm/s) Waveform  PlaqueComment(s) +-----------+----------+-----------+------+----------+ CFA                  multiphasic                 +-----------+----------+-----------+------+----------+ Prox SFA  Multiphasic                 +-----------+----------+-----------+------+----------+ Left Vein comments:  Summary: Negative for obvious evidence of left groin pseudoaneurysm. Large anechoic area suggestive of hematoma is noted in the left groin.   --------------------------------------------------------------------------------    Preliminary     Cardiac Studies   09/14/2018 LHC with PCI  Prox LAD-1 lesion is 35% stenosed with 20% stenosed side branch in Ost 1st Diag. Prox LAD-2 lesion is 80% stenosed with 90% stenosed side branch in Ost 1st Sept. A drug-eluting stent was successfully placed using a STENT RESOLUTE ONYX 3.0X22. Post intervention, there is a 0% residual stenosis. Prox Cx to Mid Cx lesion is 99% stenosed. Mid Cx lesion is 55% stenosed. A drug-eluting stent was successfully placed using a STENT RESOLUTE ONYX 3.5X30. Post intervention, there is a 0% residual stenosis. Prox RCA lesion is 70% stenosed. DFR was 0.99   1. Successful PCI of the mid LCx with DES x 1 2. Successful PCI of the proximal to mid LAD with DES x 1. 3. Proximal RCA lesion with normal DFR of 0.99  Diagnostic Dominance: Right  Intervention   Diagnostic Dominance: Right  Intervention    06/05/2022 Vas [place] Lower Extremity  +-----------+----------+-----------+------+----------+  Left DuplexPSV (cm/s) Waveform  PlaqueComment(s)  +-----------+----------+-----------+------+----------+  CFA                  multiphasic                   +-----------+----------+-----------+------+----------+  Prox SFA             Multiphasic                  +-----------+----------+-----------+------+----------+   Left Vein comments:     Summary:  Negative for obvious evidence of left groin pseudoaneurysm. Large anechoic  area  suggestive of hematoma is noted in the left groin.   Patient Profile     Jizel Cheeks is a 66 y.o. female with a hx of HTN, CAD s/p LAD/Lcx PCI, lupus, history of DVT/PE on chronic anticoagulation who is being seen today for the evaluation of left groin hematoma at the request of emergency department. Patient underwent left lower extremity SFA intervention with Dr. Gwenlyn Found on 06/02/2022. Given her history of lupus anticoagulant, she was continued on aspirin, Plavix, and rivaroxaban  Assessment & Plan    Left common femoral artery pseudoaneurysm with active bleeding hematoma Lower extremity PAD s/p L SFA intervention 06/02/2022  Patient with increasing pain following her peripheral intervention. CTA pelvis notable for a pseudoaneurysm with active bleeding from left common femoral artery access site into a 6.9 left inguinal hematoma. Patient underwent left thigh exploration with vascular surgery with evacuation of large hematoma and suture hemostasis of arteriotomy.   Per vascular surgery, VAC dressing to remain in place for 7 days. Continue ASA, Heparin to resume at 12pm today, no bolus. Hold Plavix with plans to resume in coming days. We will need to consider risk/benefit of triple therapy. Could consider dropping ASA once Plavix can safely be resumed.  CAD s/p PCI to LAD and Lcx Hypertriglyceridemia  Continue DAPT with ASA/Plavix (PCI in 2020, on Plavix given PAD). Continue Fenofibrate. Patient reportedly intolerant of statins.   Hypertension  Continue home regimen of Amlodipine, Carvedilol, Lisinopril  History of DVT/PE with Lupus anticoagulant  Patient with significant risk of clotting.  Resuming Heparin per Vascular at noon today. Will defer to vascular recs regarding timing of  Xarelto resumption.   For questions or updates, please contact [facility] Please consult [email or web address] for contact info under        Signed, Lily Kocher, PA-C  06/06/2022, 8:00 AM    Patient seen and examined.  Agree with above documentation.  On exam, patient is alert and oriented, regular rate and rhythm, no murmurs, lungs CTAB, no LE edema, large hematoma around L femoral access site.  Reports pain is controlled.  Plan per vascular surgery is to continue aspirin, hold Plavix at this time (still looks like patient received this morning, will discontinue).  Heparin planning to start at 12 PM with no bolus.  Donato Heinz, MD

## 2022-06-06 NOTE — Progress Notes (Signed)
Called by ED noting that CT scan completed. Noted to have pseudoaneurysm with active bleeding from the left CFA into a 6.9 cm left inguinal hematoma. Given that she is actively on antiplatelet and anticoagulation, I recommended consulted vascular surgery with this updated information for evaluation and recommendation of active extravasation.   Billey Chang MD  Cardiology

## 2022-06-06 NOTE — Transfer of Care (Signed)
Immediate Anesthesia Transfer of Care Note  Patient: [name] [name]  Procedure(s) Performed: LEFT GROIN EXPLORATION (Left) EVACUATION HEMATOMA LEFT GROIN (Left: Groin) APPLICATION OF WOUND VAC LEFT GROIN (Left: Groin) LEFT COMMON FEMORAL ARTERY REPAIR WITH MORCELLS (Left: Groin)  Patient Location: PACU  Anesthesia Type:General  Level of Consciousness: awake, alert , and oriented  Airway & Oxygen Therapy: Patient Spontanous Breathing  Post-op Assessment: Report given to RN, Post -op Vital signs reviewed and stable, and Patient moving all extremities  Post vital signs: Reviewed and stable  Last Vitals:  Vitals Value Taken Time  BP 145/70 06/06/22 0428  Temp    Pulse 96 06/06/22 0441  Resp 15 06/06/22 0441  SpO2 93 % 06/06/22 0441  Vitals shown include unvalidated device data.  Last Pain:  Vitals:   06/06/22 0100  TempSrc:   PainSc: 4          Complications: No notable events documented.

## 2022-06-06 NOTE — Progress Notes (Signed)
   06/06/22 1639  Assess: MEWS Score  Temp (!) 103.1 F (39.5 C)  BP (!) 140/55  MAP (mmHg) 76  Pulse Rate (!) 107  ECG Heart Rate (!) 108  Resp 20  SpO2 90 %  O2 Device Room Air  Assess: MEWS Score  MEWS Temp 2  MEWS Systolic 0  MEWS Pulse 1  MEWS RR 0  MEWS LOC 0  MEWS Score 3  MEWS Score Color Yellow  Assess: if the MEWS score is Yellow or Red  Were vital signs taken at a resting state? Yes  Focused Assessment Change from prior assessment (see assessment flowsheet)  Does the patient meet 2 or more of the SIRS criteria? Yes  Does the patient have a confirmed or suspected source of infection? No  MEWS guidelines implemented *See Row Information* Yes  Treat  MEWS Interventions Administered prn meds/treatments  Pain Scale 0-10  Pain Score 3  Take Vital Signs  Increase Vital Sign Frequency  Yellow: Q 2hr X 2 then Q 4hr X 2, if remains yellow, continue Q 4hrs  Escalate  MEWS: Escalate Yellow: discuss with charge nurse/RN and consider discussing with provider and RRT  Notify: Charge Nurse/RN  Name of Charge Nurse/RN Notified Yoko RN  Date Charge Nurse/RN Notified 06/06/22  Time Charge Nurse/RN Notified 1700  Notify: Provider  Provider Name/Title PA Williams  Date Provider Notified 06/06/22  Time Provider Notified 1645  Method of Notification Page  Notification Reason Change in status  Provider response Other (Comment) (waiting on response)  Document  Patient Outcome Other (Comment) (remains stable)  Progress note created (see row info) Yes  Assess: SIRS CRITERIA  SIRS Temperature  1  SIRS Pulse 1  SIRS Respirations  0  SIRS WBC 1  SIRS Score Sum  3

## 2022-06-06 NOTE — Anesthesia Procedure Notes (Signed)
Arterial Line Insertion Start/End10/20/2023 3:15 AM, 06/06/2022 3:23 AM Performed by: Santa Lighter, MD, anesthesiologist  Patient location: OR. Preanesthetic checklist: patient identified, IV checked and pre-op evaluation Patient sedated Right, radial was placed Catheter size: 20 G Hand hygiene performed  and maximum sterile barriers used   Attempts: 2 Procedure performed using ultrasound guided technique. Ultrasound Notes:anatomy identified Following insertion, dressing applied and Biopatch. Post procedure assessment: normal  Patient tolerated the procedure well with no immediate complications.

## 2022-06-06 NOTE — Progress Notes (Signed)
   06/06/22 1700  Assess: MEWS Score  Temp (!) 103.7 F (39.8 C)  Pulse Rate (!) 116  ECG Heart Rate (!) 116  Resp (!) 23  SpO2 95 %  O2 Device Nasal Cannula  O2 Flow Rate (L/min) 2 L/min  Assess: MEWS Score  MEWS Temp 2  MEWS Systolic 0  MEWS Pulse 2  MEWS RR 1  MEWS LOC 0  MEWS Score 5  MEWS Score Color Red  Assess: if the MEWS score is Yellow or Red  Were vital signs taken at a resting state? Yes  Focused Assessment Change from prior assessment (see assessment flowsheet)  Does the patient meet 2 or more of the SIRS criteria? Yes  Does the patient have a confirmed or suspected source of infection? No  MEWS guidelines implemented *See Row Information* Yes  Take Vital Signs  Increase Vital Sign Frequency  Red: Q 1hr X 4 then Q 4hr X 4, if remains red, continue Q 4hrs  Escalate  MEWS: Escalate Red: discuss with charge nurse/RN and provider, consider discussing with RRT  Notify: Charge Nurse/RN  Name of Charge Nurse/RN Notified Yoko RN  Notify: Provider  Provider Name/Title Dorene Ar  Date Provider Notified 06/06/22  Time Provider Notified 1710  Method of Notification Page  Notification Reason Change in status  Provider response Other (Comment) (awaiting response)  Document  Progress note created (see row info) Yes  Assess: SIRS CRITERIA  SIRS Temperature  1  SIRS Pulse 1  SIRS Respirations  1  SIRS WBC 1  SIRS Score Sum  4

## 2022-06-06 NOTE — Consult Note (Signed)
Hospital Consult    Reason for Consult: Left groin pseudoaneurysm Requesting Physician: ED MRN #:  [id number]  History of Present Illness: This is a 66 y.o. female with peripheral arterial disease, clotting disorder, currently on aspirin, Plavix, Xarelto who presented to the ED with swelling in the left leg status post lower extremity intervention.  Imaging demonstrated pseudoaneurysm with active extravasation into soft tissue.  Vascular surgery was called for further management.  On exam, [name] [name] was resting comfortably, with her husband at bedside.  Past Medical History:  Diagnosis Date   Back pain    CAD S/P percutaneous coronary angioplasty 09/13/2018   09/13/2018 - Cath & Staged DES PCI on 09/14/2018: DES PCI: Cx99%&55% - Resolute Onyx DES 3.5 x 30 (3.6 mm), p-mLAD (prox of D1 almost to D2) - Resolute Onyx DES 3.0 x 22 (3.3 mm); DFR on pRCA 70% - 0.99, Not significant -> Rec Med Rx.   DVT (deep venous thrombosis) ([place])    Heart attack ([place]) 09/09/2018   Hypertension    Lupus anticoagulant syndrome (HCC)    NSTEMI (non-ST elevated myocardial infarction) ([place]) 09/12/2018   Initial presentation with chest pain was on 09/09/2018, recurrent pain on 1/26 2020 with positive troponins.  Cath showed three-vessel CAD -> declined CABG, opted for two-vessel DES PCI (LAD and CX, negative DFR RCA)   Pulmonary embolism ([place])     Past Surgical History:  Procedure Laterality Date   ABDOMINAL AORTOGRAM W/LOWER EXTREMITY N/A 06/02/2022   Procedure: ABDOMINAL AORTOGRAM W/LOWER EXTREMITY;  Surgeon: Lorretta Harp, MD;  Location: [facility];  Service: Cardiovascular;  Laterality: N/A;   BACK SURGERY     COLONOSCOPY  05/19/2012   Procedure: COLONOSCOPY;  Surgeon: Beryle Beams, MD;  Location: [facility];  Service: Endoscopy;  Laterality: N/A;   COLONOSCOPY WITH PROPOFOL N/A 09/30/2018   Procedure: COLONOSCOPY WITH PROPOFOL;  Surgeon: Yetta Flock, MD;  Location: [facility];  Service:  Gastroenterology;  Laterality: N/A;   CORONARY STENT INTERVENTION N/A 09/14/2018   Procedure: CORONARY STENT INTERVENTION;  Surgeon: Martinique, Peter M, MD;  Location: [facility];  Service: Cardiovascular: September 14, 2018- DES PCI: Cx99%&55% - Resolute Onyx DES 3.5 x 30 (3.6 mm), p-mLAD (prox of D1 almost to D2) - Resolute Onyx DES 3.0 x 22 (3.3 mm); DFR on pRCA 70% - 0.99, Not significant -> Rec Med Rx.   ESOPHAGOGASTRODUODENOSCOPY  05/19/2012   Procedure: ESOPHAGOGASTRODUODENOSCOPY (EGD);  Surgeon: Beryle Beams, MD;  Location: [facility] ENDOSCOPY;  Service: Endoscopy;  Laterality: N/A;   ESOPHAGOGASTRODUODENOSCOPY (EGD) WITH PROPOFOL N/A 09/30/2018   Procedure: ESOPHAGOGASTRODUODENOSCOPY (EGD) WITH PROPOFOL;  Surgeon: Yetta Flock, MD;  Location: [facility];  Service: Gastroenterology;  Laterality: N/A;   GIVENS CAPSULE STUDY N/A 09/30/2018   Procedure: GIVENS CAPSULE STUDY;  Surgeon: Yetta Flock, MD;  Location: [facility];  Service: Gastroenterology;  Laterality: N/A;   HOT HEMOSTASIS N/A 09/30/2018   Procedure: HOT HEMOSTASIS (ARGON PLASMA COAGULATION/BICAP);  Surgeon: Yetta Flock, MD;  Location: [facility] ENDOSCOPY;  Service: Gastroenterology;  Laterality: N/A;   INTRAVASCULAR PRESSURE WIRE/FFR STUDY N/A 09/14/2018   Procedure: INTRAVASCULAR PRESSURE WIRE/FFR STUDY;  Surgeon: Martinique, Peter M, MD;  Location: [facility];  Service: Cardiovascular;  Laterality: RCA: DFR on pRCA 70% - 0.99, Not significant -> Rec Med Rx.   LEFT HEART CATH AND CORONARY ANGIOGRAPHY N/A 09/13/2018   Procedure: LEFT HEART CATH AND CORONARY ANGIOGRAPHY;  Surgeon: Leonie Man, MD;  Location: [facility];  Service: Cardiovascular:: CULPRIT LESION 99% p-mCx followed by 55%mCx; pLAD 35% A D11fllowed by long 80% lesion @ SP1); pRCA 70%. Normal LVEDP. Global HK EF ~45%.  -Decision on PCI deferred until discussion about PCI versus CABG.  Concern because of long-term DOAC.   PERIPHERAL VASCULAR  ATHERECTOMY  06/02/2022   Procedure: PERIPHERAL VASCULAR ATHERECTOMY;  Surgeon: BLorretta Harp MD;  [facility];  Service: Cardiovascular;;   PERIPHERAL VASCULAR BALLOON ANGIOPLASTY  06/02/2022   Procedure: PERIPHERAL VASCULAR BALLOON ANGIOPLASTY;  Surgeon: BLorretta Harp MD;  [facility];  Service: Cardiovascular;;   TRANSTHORACIC ECHOCARDIOGRAM  09/13/2018   Mildly reduced EF 45 to 50% with diffuse HK.  GR 1 DD.  Mild aortic valve calcification.  MAC.  Moderate pulmonic regurgitation.    Allergies  Allergen Reactions   Prednisone Other (See Comments)    No energy "stalls out".    Makes pt  Jittery.   Tramadol Other (See Comments)    Constipation,  Makes pt  Jittery.   Crestor [Rosuvastatin Calcium]     Feels fatigued, RE  CHALLENGE - UNABLE TO USE 12/28/18   Hctz [Hydrochlorothiazide]     Dizzy    Repatha [Evolocumab] Other (See Comments)    Per patient never took this medication    Prior to Admission medications   Medication Sig Start Date End Date Taking? Authorizing Provider  acetaminophen (TYLENOL) 500 MG tablet Take 500 mg by mouth daily as needed for mild pain or moderate pain.   Yes [provider]  acetaminophen (TYLENOL) 650 MG CR tablet Take 650 mg by mouth daily as needed for pain.   Yes [provider]  amLODipine (NORVASC) 5 MG tablet TAKE 1 TABLET BY MOUTH TWICE A DAY Patient taking differently: Take 2.5 mg by mouth 2 (two) times daily. 04/28/22  Yes HLeonie Man MD  aspirin EC 81 MG tablet Take 1 tablet (81 mg total) by mouth daily. Swallow whole. 06/04/22  Yes RCheryln Manly NP  bisacodyl (DULCOLAX) 5 MG EC tablet Take 5 mg by mouth daily as needed for moderate constipation.   Yes [provider]  carvedilol (COREG) 6.25 MG tablet Take 0.5 tablets (3.125 mg total) by mouth 2 (two) times daily with a meal. 08/16/21  Yes HLeonie Man MD  clopidogrel (PLAVIX) 75 MG tablet Take 1 tablet (75  mg total) by mouth daily. 05/23/22  Yes BLorretta Harp MD  fenofibrate (TRICOR) 145 MG tablet TAKE 1 TABLET (145 MG TOTAL) BY MOUTH DAILY. TAKE WITH A MEAL. Patient taking differently: Take 145 mg by mouth daily. 09/16/21  Yes HLeonie Man MD  lisinopril (ZESTRIL) 40 MG tablet TAKE 1 TABLET BY MOUTH EVERY DAY Patient taking differently: Take 40 mg by mouth daily. 05/13/22  Yes BBillie Ruddy MD  nitroGLYCERIN (NITROSTAT) 0.4 MG SL tablet Place 1 tablet (0.4 mg total) under the tongue every 5 (five) minutes as needed for up to 3 doses for chest pain. 07/19/20  Yes HLeonie Man MD  rivaroxaban (XARELTO) 20 MG TABS tablet Take 1 tablet (20 mg total) by mouth daily with supper. 06/04/22  Yes BLorretta Harp MD    Social History   Socioeconomic History   Marital status: Married    Spouse name: Not on file   Number of children: 2   Years of education: Not on file   Highest education level: Not on file  Occupational History   Occupation: SERVICE  MANAGER    Employer: [employer]  Tobacco Use   Smoking status: Every Day    Packs/day: 0.50    Years: 29.00    Total pack years: 14.50    Types: Cigarettes   Smokeless tobacco: Never   Tobacco comments:    Cut back to quarter pack a day  Vaping Use   Vaping Use: Never used  Substance and Sexual Activity   Alcohol use: Yes    Comment: Occasional   Drug use: No   Sexual activity: Not on file  Other Topics Concern   Not on file  Social History Narrative   Really is a married mother of 2.  1 son died from complications of spina bifida after multiple surgeries.   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     She has a has a Oceanographer in education.     Drinks social alcohol & denies drug use.     Pt endorses smoking maybe 2 cigarettes/day.  States she placed most of the cigarette, may have 2 puffs.   Social Determinants of Health   Financial Resource Strain: Low Risk  (10/31/2021)    Overall Financial Resource Strain (CARDIA)    Difficulty of Paying Living Expenses: Not hard at all  Food Insecurity: No Food Insecurity (10/31/2021)   Hunger Vital Sign    Worried About Running Out of Food in the Last Year: Never true    Ran Out of Food in the Last Year: Never true  Transportation Needs: No Transportation Needs (10/31/2021)   PRAPARE - Hydrologist (Medical): No    Lack of Transportation (Non-Medical): No  Physical Activity: Inactive (10/31/2021)   Exercise Vital Sign    Days of Exercise per Week: 0 days    Minutes of Exercise per Session: 0 min  Stress: No Stress Concern Present (10/31/2021)   [place]    Feeling of Stress : Not at all  Social Connections: [place] (10/31/2021)   Social Connection and Isolation Panel [NHANES]    Frequency of Communication with Friends and Family: More than three times a week    Frequency of Social Gatherings with Friends and Family: More than three times a week    Attends Religious Services: More than 4 times per year    Active Member of [employer] or Organizations: Yes    Attends Music therapist: More than 4 times per year    Marital Status: Married  Human resources officer Violence: Not At Risk (10/31/2021)   Humiliation, Afraid, Rape, and Kick questionnaire    Fear of Current or Ex-Partner: No    Emotionally Abused: No    Physically Abused: No    Sexually Abused: No    Family History  Problem Relation Age of Onset   Leukemia Mother    Prostate cancer Father    Cancer Father    Stroke Maternal Grandmother    Lung cancer Maternal Grandfather    Leukemia Paternal Grandmother    Cancer Paternal Grandfather    Spina bifida Son        Multiple surgeries   Colon cancer Neg Hx    Esophageal cancer Neg Hx     ROS: Otherwise negative unless mentioned in HPI  Physical Examination  Vitals:   06/06/22 0030 06/06/22 0045   BP: 133/62 133/74  Pulse: 87 82  Resp: 14 13  Temp:  SpO2: 96% 99%   Body mass index is 25.34 kg/m.  General:  WDWN in NAD Gait: Not observed HENT: WNL, normocephalic Pulmonary: normal non-labored breathing, without Rales, rhonchi,  wheezing Cardiac: regular Abdomen:  soft, NT/ND, no masses Skin: without rashes, severe ecchymosis in the left groin Vascular Exam/Pulses: Nonpalpable left femoral pulse due to large hematoma nonpalpable signals in the feet Extremities: without ischemic changes, without Gangrene , without cellulitis; without open wounds;  Musculoskeletal: no muscle wasting or atrophy  Neurologic: A&O X 3;  No focal weakness or paresthesias are detected; speech is fluent/normal Psychiatric:  The pt has Normal affect. Lymph:  Unremarkable  CBC    Component Value Date/Time   WBC 10.5 06/06/2022 0035   RBC 3.31 (L) 06/06/2022 0035   HGB 9.8 (L) 06/06/2022 0035   HGB 13.8 05/23/2022 1004   HGB 12.9 09/22/2013 1213   HCT 30.5 (L) 06/06/2022 0035   HCT 40.4 05/23/2022 1004   HCT 38.4 09/22/2013 1213   PLT 392 06/06/2022 0035   PLT 496 (H) 05/23/2022 1004   MCV 92.1 06/06/2022 0035   MCV 87 05/23/2022 1004   MCV 87.7 09/22/2013 1213   MCH 29.6 06/06/2022 0035   MCHC 32.1 06/06/2022 0035   RDW 13.0 06/06/2022 0035   RDW 12.2 05/23/2022 1004   RDW 13.2 09/22/2013 1213   LYMPHSABS 2.1 06/05/2022 2000   LYMPHSABS 2.6 11/15/2021 1133   LYMPHSABS 2.7 09/22/2013 1213   MONOABS 1.0 06/05/2022 2000   MONOABS 0.5 09/22/2013 1213   EOSABS 0.2 06/05/2022 2000   EOSABS 0.2 11/15/2021 1133   BASOSABS 0.0 06/05/2022 2000   BASOSABS 0.0 11/15/2021 1133   BASOSABS 0.1 09/22/2013 1213    BMET    Component Value Date/Time   NA 137 06/05/2022 2000   NA 142 05/23/2022 1004   NA 142 09/22/2013 1213   K 3.2 (L) 06/05/2022 2000   K 3.7 09/22/2013 1213   CL 102 06/05/2022 2000   CL 105 09/16/2012 0821   CO2 22 06/05/2022 2000   CO2 24 09/22/2013 1213   GLUCOSE 120  (H) 06/05/2022 2000   GLUCOSE 93 09/22/2013 1213   GLUCOSE 82 09/16/2012 0821   BUN 6 (L) 06/05/2022 2000   BUN 6 (L) 05/23/2022 1004   BUN 7.6 09/22/2013 1213   CREATININE 0.79 06/05/2022 2000   CREATININE 0.7 09/22/2013 1213   CALCIUM 9.5 06/05/2022 2000   CALCIUM 10.1 09/22/2013 1213   GFRNONAA >60 06/05/2022 2000   GFRAA 98 06/14/2020 0955    COAGS: Lab Results  Component Value Date   INR 1.2 05/19/2019   INR 1.17 09/29/2018   INR 1.10 09/12/2018     ASSESSMENT/PLAN: This is a 66 y.o. female with active bleeding from a left-sided common femoral artery pseudoaneurysm.  Patient needs operative exploration, common femoral artery repair.  After discussing the risk and benefits, Azrielle elected to proceed.  She understands that due to her anticoagulation, this could require another operation for wound washout.  She is also aware my biggest concern is poor healing due to the significant tissue trauma and ecchymosis.    Cassandria Santee MD MS [facility] [phone] 06/06/2022  2:59 AM

## 2022-06-06 NOTE — Anesthesia Preprocedure Evaluation (Addendum)
Anesthesia Evaluation  Patient identified by MRN, date of birth, ID band Patient awake    Reviewed: Allergy & Precautions, NPO status , Patient's Chart, lab work & pertinent test results, reviewed documented beta blocker date and time   Airway Mallampati: III  TM Distance: >3 FB Neck ROM: Full    Dental  (+) Dental Advisory Given, Poor Dentition, Missing   Pulmonary Current SmokerPatient did not abstain from smoking., PE   Pulmonary exam normal breath sounds clear to auscultation       Cardiovascular hypertension, Pt. on medications and Pt. on home beta blockers + CAD, + Past MI, + Cardiac Stents and + Peripheral Vascular Disease (left groin hemorrhage)  Normal cardiovascular exam Rhythm:Regular Rate:Normal     Neuro/Psych  PSYCHIATRIC DISORDERS Anxiety      Neuromuscular disease    GI/Hepatic Neg liver ROS,GERD  ,,  Endo/Other  negative endocrine ROS    Renal/GU negative Renal ROS     Musculoskeletal negative musculoskeletal ROS (+)    Abdominal   Peds  Hematology  (+) Blood dyscrasia (Plavix), anemia   Anesthesia Other Findings Day of surgery medications reviewed with the patient.  Reproductive/Obstetrics                             Anesthesia Physical Anesthesia Plan  ASA: 3 and emergent  Anesthesia Plan: General   Post-op Pain Management: Toradol IV (intra-op)*   Induction: Intravenous  PONV Risk Score and Plan: 2 and Dexamethasone and Ondansetron  Airway Management Planned: Oral ETT  Additional Equipment:   Intra-op Plan:   Post-operative Plan: Extubation in OR  Informed Consent: I have reviewed the patients History and Physical, chart, labs and discussed the procedure including the risks, benefits and alternatives for the proposed anesthesia with the patient or authorized representative who has indicated his/her understanding and acceptance.     Dental advisory  given  Plan Discussed with: CRNA  Anesthesia Plan Comments:          Anesthesia Quick Evaluation

## 2022-06-06 NOTE — Progress Notes (Signed)
Patient temperature 103.1 orally, HR 110, BP 140/55. Cardiology PA paged.  Awaiting call back.  Tylenol given.

## 2022-06-06 NOTE — Progress Notes (Signed)
ANTICOAGULATION CONSULT NOTE - Initial Consult  Pharmacy Consult for Heparin (Xarelto on hold) Indication: Lupus anti-coagulant, history of VTE  Allergies  Allergen Reactions   Prednisone Other (See Comments)    No energy "stalls out".    Makes pt  Jittery.   Tramadol Other (See Comments)    Constipation,  Makes pt  Jittery.   Crestor [Rosuvastatin Calcium]     Feels fatigued, RE  CHALLENGE - UNABLE TO USE 12/28/18   Hctz [Hydrochlorothiazide]     Dizzy    Repatha [Evolocumab] Other (See Comments)    Per patient never took this medication    Patient Measurements: Height: 5' 4.5" (163.8 cm) Weight: 68 kg (149 lb 14.6 oz) IBW/kg (Calculated) : 55.85   Vital Signs: Temp: 98.1 F (36.7 C) (10/20 2036) Temp Source: Oral (10/20 2036) BP: 105/43 (10/20 2036) Pulse Rate: 92 (10/20 2036)  Labs: Recent Labs    06/05/22 2000 06/06/22 0035 06/06/22 0338 06/06/22 0619 06/06/22 1405 06/06/22 1850  HGB 10.8* 9.8*   < > 8.4* 11.1* 10.0*  HCT 32.5* 30.5*   < > 24.5* 32.5* 29.7*  PLT 414* 392  --  331  --  316  APTT  --   --   --   --   --  35  LABPROT  --   --   --  16.9*  --   --   INR  --   --   --  1.4*  --   --   HEPARINUNFRC  --   --   --   --   --  0.11*  CREATININE 0.79  --   --  0.74  --   --    < > = values in this interval not displayed.     Estimated Creatinine Clearance: 65.4 mL/min (by C-G formula based on SCr of 0.74 mg/dL).   Medical History: Past Medical History:  Diagnosis Date   Back pain    CAD S/P percutaneous coronary angioplasty 09/13/2018   09/13/2018 - Cath & Staged DES PCI on 09/14/2018: DES PCI: Cx99%&55% - Resolute Onyx DES 3.5 x 30 (3.6 mm), p-mLAD (prox of D1 almost to D2) - Resolute Onyx DES 3.0 x 22 (3.3 mm); DFR on pRCA 70% - 0.99, Not significant -> Rec Med Rx.   DVT (deep venous thrombosis) ([place])    Heart attack ([place]) 09/09/2018   Hypertension    Lupus anticoagulant syndrome (HCC)    NSTEMI (non-ST elevated myocardial infarction) ([place])  09/12/2018   Initial presentation with chest pain was on 09/09/2018, recurrent pain on 1/26 2020 with positive troponins.  Cath showed three-vessel CAD -> declined CABG, opted for two-vessel DES PCI (LAD and CX, negative DFR RCA)   Pulmonary embolism (HCC)     Assessment: 66 y/o F on Xarelto PTA for lupus anti-coagulant and history of VTE. Recent SFA intervention of 10/16. Found to have pseudoaneurysm and active bleeding. Pt now s/p OR with vascular. Consulted to start pt on heparin at 1200 today. Last Xarelto dose 10/18 at 2030.   Heparin level and PTT came back subtherapeutic tonight. She was previously therapeutic on 1100 units/hr here so we will increase to that rate. Heparin level and PTT seem to be correlating but we will check one more time.  Goal of Therapy:  Heparin level 0.3-0.5 units/ml aPTT 66-84 seconds Monitor platelets by anticoagulation protocol: Yes   Plan:  Increase heparin drip to 1100 units/hr AM aPTT and heparin level Daily CBC, aPTT, heparin  level Monitor closely for any further bleeding  Onnie Boer, PharmD, BCIDP, AAHIVP, CPP Infectious Disease Pharmacist 06/06/2022 8:48 PM

## 2022-06-06 NOTE — Plan of Care (Signed)

## 2022-06-06 NOTE — Anesthesia Postprocedure Evaluation (Signed)
Anesthesia Post Note  Patient: [name] [name]  Procedure(s) Performed: LEFT GROIN EXPLORATION (Left) EVACUATION HEMATOMA LEFT GROIN (Left: Groin) APPLICATION OF WOUND VAC LEFT GROIN (Left: Groin) LEFT COMMON FEMORAL ARTERY REPAIR WITH MORCELLS (Left: Groin)     Patient location during evaluation: PACU Anesthesia Type: General Level of consciousness: awake and alert Pain management: pain level controlled Vital Signs Assessment: post-procedure vital signs reviewed and stable Respiratory status: spontaneous breathing, nonlabored ventilation, respiratory function stable and patient connected to nasal cannula oxygen Cardiovascular status: blood pressure returned to baseline and stable Postop Assessment: no apparent nausea or vomiting Anesthetic complications: no   No notable events documented.  Last Vitals:  Vitals:   06/06/22 0625 06/06/22 0641  BP: (!) 105/51   Pulse:    Resp:    Temp: 36.7 C 36.7 C  SpO2:      Last Pain:  Vitals:   06/06/22 0641  TempSrc: Oral  PainSc:                  Santa Lighter

## 2022-06-06 NOTE — Telephone Encounter (Signed)
Pharmacy Patient Advocate Encounter  Insurance verification completed.    The patient is insured through [employer]   The patient is currently admitted and ran test claims for the following: Eliquis.  Copays and coinsurance results were relayed to Inpatient clinical team.   

## 2022-06-06 NOTE — Progress Notes (Addendum)
  Called by RN for fever 103.  Sp02 to 91%  nasal cannula added at 2 L  Will check blood cultures and PCXR.  And u/a.  CBC she has had 2 tylenol.  RN will notify vascular.   Pt feeling better now.  Had chill then temp went up.   Cecilie Kicks, FNP-C [facility]  [phone] or after 5pm and on weekends call [phone] 06/06/2022.

## 2022-06-07 DIAGNOSIS — R509 Fever, unspecified: Secondary | ICD-10-CM

## 2022-06-07 DIAGNOSIS — I724 Aneurysm of artery of lower extremity: Secondary | ICD-10-CM | POA: Diagnosis not present

## 2022-06-07 DIAGNOSIS — I739 Peripheral vascular disease, unspecified: Secondary | ICD-10-CM | POA: Diagnosis not present

## 2022-06-07 DIAGNOSIS — D62 Acute posthemorrhagic anemia: Secondary | ICD-10-CM

## 2022-06-07 LAB — TYPE AND SCREEN
ABO/RH(D): A POS
Antibody Screen: NEGATIVE
Unit division: 0
Unit division: 0

## 2022-06-07 LAB — BPAM RBC
Blood Product Expiration Date: 202310262359
Blood Product Expiration Date: 202311112359
ISSUE DATE / TIME: 202310200458
ISSUE DATE / TIME: 202310201003
Unit Type and Rh: 6200
Unit Type and Rh: 6200

## 2022-06-07 LAB — COMPREHENSIVE METABOLIC PANEL
ALT: 10 U/L (ref 0–44)
AST: 15 U/L (ref 15–41)
Albumin: 2.5 g/dL — ABNORMAL LOW (ref 3.5–5.0)
Alkaline Phosphatase: 36 U/L — ABNORMAL LOW (ref 38–126)
Anion gap: 7 (ref 5–15)
BUN: 11 mg/dL (ref 8–23)
CO2: 22 mmol/L (ref 22–32)
Calcium: 8.1 mg/dL — ABNORMAL LOW (ref 8.9–10.3)
Chloride: 102 mmol/L (ref 98–111)
Creatinine, Ser: 0.94 mg/dL (ref 0.44–1.00)
GFR, Estimated: >60 mL/min (ref >60)
Glucose, Bld: 101 mg/dL — ABNORMAL HIGH (ref 70–99)
Potassium: 3.7 mmol/L (ref 3.5–5.1)
Sodium: 131 mmol/L — ABNORMAL LOW (ref 135–145)
Total Bilirubin: 1 mg/dL (ref 0.3–1.2)
Total Protein: 5.1 g/dL — ABNORMAL LOW (ref 6.5–8.1)

## 2022-06-07 LAB — HEPARIN LEVEL (UNFRACTIONATED)
Heparin Unfractionated: 0.24 IU/mL — ABNORMAL LOW (ref 0.30–0.70)
Heparin Unfractionated: 0.24 IU/mL — ABNORMAL LOW (ref 0.30–0.70)

## 2022-06-07 LAB — PROCALCITONIN: Procalcitonin: 0.83 ng/mL

## 2022-06-07 LAB — CBC
HCT: 27.8 % — ABNORMAL LOW (ref 36.0–46.0)
Hemoglobin: 9.6 g/dL — ABNORMAL LOW (ref 12.0–15.0)
MCH: 29.1 pg (ref 26.0–34.0)
MCHC: 34.5 g/dL (ref 30.0–36.0)
MCV: 84.2 fL (ref 80.0–100.0)
Platelets: 237 10*3/uL (ref 150–400)
RBC: 3.3 MIL/uL — ABNORMAL LOW (ref 3.87–5.11)
RDW: 16.8 % — ABNORMAL HIGH (ref 11.5–15.5)
WBC: 15.8 10*3/uL — ABNORMAL HIGH (ref 4.0–10.5)
nRBC: 0 % (ref 0.0–0.2)

## 2022-06-07 LAB — SARS CORONAVIRUS 2 BY RT PCR: SARS Coronavirus 2 by RT PCR: NEGATIVE

## 2022-06-07 LAB — APTT
aPTT: 53 seconds — ABNORMAL HIGH (ref 24–36)
aPTT: 46 seconds — ABNORMAL HIGH (ref 24–36)

## 2022-06-07 MED ORDER — CLOPIDOGREL BISULFATE 75 MG PO TABS
75.0000 mg | ORAL_TABLET | Freq: Every day | ORAL | Status: DC
  Administered 2022-06-07 – 2022-06-09 (×3): 75 mg via ORAL
  Filled 2022-06-07 (×3): qty 1

## 2022-06-07 MED ORDER — LISINOPRIL 10 MG PO TABS
10.0000 mg | ORAL_TABLET | Freq: Every day | ORAL | Status: DC
  Administered 2022-06-08 – 2022-06-09 (×2): 10 mg via ORAL
  Filled 2022-06-07 (×2): qty 1

## 2022-06-07 NOTE — Progress Notes (Signed)
ANTICOAGULATION CONSULT NOTE - Follow-up Consult  Pharmacy Consult for Heparin (Xarelto on hold) Indication: Lupus anti-coagulant, history of VTE  Allergies  Allergen Reactions   Prednisone Other (See Comments)    No energy "stalls out".    Makes pt  Jittery.   Tramadol Other (See Comments)    Constipation,  Makes pt  Jittery.   Crestor [Rosuvastatin Calcium]     Feels fatigued, RE  CHALLENGE - UNABLE TO USE 12/28/18   Hctz [Hydrochlorothiazide]     Dizzy    Repatha [Evolocumab] Other (See Comments)    Per patient never took this medication    Patient Measurements: Height: 5' 4.5" (163.8 cm) Weight: 68 kg (149 lb 14.6 oz) IBW/kg (Calculated) : 55.85   Vital Signs: Temp: 98.8 F (37.1 C) (10/21 0442) Temp Source: Oral (10/21 0442) BP: 102/42 (10/21 0442) Pulse Rate: 90 (10/21 0442)  Labs: Recent Labs    06/05/22 2000 06/06/22 0035 06/06/22 0619 06/06/22 1405 06/06/22 1850 06/07/22 0600  HGB 10.8*   < > 8.4* 11.1* 10.0* 9.6*  HCT 32.5*   < > 24.5* 32.5* 29.7* 27.8*  PLT 414*   < > 331  --  316 237  APTT  --   --   --   --  35 53*  LABPROT  --   --  16.9*  --   --   --   INR  --   --  1.4*  --   --   --   HEPARINUNFRC  --   --   --   --  0.11* 0.24*  CREATININE 0.79  --  0.74  --   --   --    < > = values in this interval not displayed.     Estimated Creatinine Clearance: 65.4 mL/min (by C-G formula based on SCr of 0.74 mg/dL).   Medical History: Past Medical History:  Diagnosis Date   Back pain    CAD S/P percutaneous coronary angioplasty 09/13/2018   09/13/2018 - Cath & Staged DES PCI on 09/14/2018: DES PCI: Cx99%&55% - Resolute Onyx DES 3.5 x 30 (3.6 mm), p-mLAD (prox of D1 almost to D2) - Resolute Onyx DES 3.0 x 22 (3.3 mm); DFR on pRCA 70% - 0.99, Not significant -> Rec Med Rx.   DVT (deep venous thrombosis) ([place])    Heart attack ([place]) 09/09/2018   Hypertension    Lupus anticoagulant syndrome (HCC)    NSTEMI (non-ST elevated myocardial infarction)  ([place]) 09/12/2018   Initial presentation with chest pain was on 09/09/2018, recurrent pain on 1/26 2020 with positive troponins.  Cath showed three-vessel CAD -> declined CABG, opted for two-vessel DES PCI (LAD and CX, negative DFR RCA)   Pulmonary embolism (HCC)     Assessment: 66 y/o F on Xarelto PTA for lupus anti-coagulant and history of VTE. Recent SFA intervention of 10/16. Found to have pseudoaneurysm and active bleeding. Pt now s/p OR with vascular. Consulted to start pt on heparin at 1200 today. Last Xarelto dose 10/18 at 2030.   Heparin level (0.24) and PTT (53) remain slightly subtherapeutic. Heparin level and PTT seem to be correlating but we will check one more time.  No issues with infusion or bleeding noted.   Goal of Therapy:  Heparin level 0.3-0.5 units/ml aPTT 66-84 seconds Monitor platelets by anticoagulation protocol: Yes   Plan:  Increase heparin drip to 1150 units/hr 1400 aPTT and heparin level Daily CBC, aPTT, heparin level Monitor closely for any further bleeding  Francena Hanly, PharmD Pharmacy Resident  06/07/2022 7:35 AM

## 2022-06-07 NOTE — Progress Notes (Signed)
  Progress Note    06/07/2022 9:52 AM 1 Day Post-Op  Subjective: Still having some left groin pain  Vitals:   06/07/22 0442 06/07/22 0816  BP: (!) 102/42 (!) 113/46  Pulse: 90 100  Resp: (!) 21 20  Temp: 98.8 F (37.1 C) 98.9 F (37.2 C)  SpO2: 95% 92%    Physical Exam: Awake alert oriented Left groin is soft with expected postop hematoma and Praveena to suction Feet are warm and well-perfused  CBC    Component Value Date/Time   WBC 15.8 (H) 06/07/2022 0600   RBC 3.30 (L) 06/07/2022 0600   HGB 9.6 (L) 06/07/2022 0600   HGB 13.8 05/23/2022 1004   HGB 12.9 09/22/2013 1213   HCT 27.8 (L) 06/07/2022 0600   HCT 40.4 05/23/2022 1004   HCT 38.4 09/22/2013 1213   PLT 237 06/07/2022 0600   PLT 496 (H) 05/23/2022 1004   MCV 84.2 06/07/2022 0600   MCV 87 05/23/2022 1004   MCV 87.7 09/22/2013 1213   MCH 29.1 06/07/2022 0600   MCHC 34.5 06/07/2022 0600   RDW 16.8 (H) 06/07/2022 0600   RDW 12.2 05/23/2022 1004   RDW 13.2 09/22/2013 1213   LYMPHSABS 2.1 06/05/2022 2000   LYMPHSABS 2.6 11/15/2021 1133   LYMPHSABS 2.7 09/22/2013 1213   MONOABS 1.0 06/05/2022 2000   MONOABS 0.5 09/22/2013 1213   EOSABS 0.2 06/05/2022 2000   EOSABS 0.2 11/15/2021 1133   BASOSABS 0.0 06/05/2022 2000   BASOSABS 0.0 11/15/2021 1133   BASOSABS 0.1 09/22/2013 1213    BMET    Component Value Date/Time   NA 131 (L) 06/07/2022 0600   NA 142 05/23/2022 1004   NA 142 09/22/2013 1213   K 3.7 06/07/2022 0600   K 3.7 09/22/2013 1213   CL 102 06/07/2022 0600   CL 105 09/16/2012 0821   CO2 22 06/07/2022 0600   CO2 24 09/22/2013 1213   GLUCOSE 101 (H) 06/07/2022 0600   GLUCOSE 93 09/22/2013 1213   GLUCOSE 82 09/16/2012 0821   BUN 11 06/07/2022 0600   BUN 6 (L) 05/23/2022 1004   BUN 7.6 09/22/2013 1213   CREATININE 0.94 06/07/2022 0600   CREATININE 0.7 09/22/2013 1213   CALCIUM 8.1 (L) 06/07/2022 0600   CALCIUM 10.1 09/22/2013 1213   GFRNONAA >60 06/07/2022 0600   GFRAA 98 06/14/2020  0955    INR    Component Value Date/Time   INR 1.4 (H) 06/06/2022 0619     Intake/Output Summary (Last 24 hours) at 06/07/2022 6606 Last data filed at 06/07/2022 3016 Gross per 24 hour  Intake 937.53 ml  Output 560 ml  Net 377.53 ml     Assessment:  66 y.o. female is s/p repair left common femoral psa  Plan: Okay for discharge arrange follow-up in 2 weeks  Preveena for discharge     Ivette Castronova C. Donzetta Matters, MD [facility] Office: [phone] Pager: [phone]  06/07/2022 9:52 AM

## 2022-06-07 NOTE — Progress Notes (Signed)
ANTICOAGULATION CONSULT NOTE - Follow-up Consult  Pharmacy Consult for Heparin (Xarelto on hold) Indication: Lupus anti-coagulant, history of VTE  Allergies  Allergen Reactions   Prednisone Other (See Comments)    No energy "stalls out".    Makes pt  Jittery.   Tramadol Other (See Comments)    Constipation,  Makes pt  Jittery.   Crestor [Rosuvastatin Calcium]     Feels fatigued, RE  CHALLENGE - UNABLE TO USE 12/28/18   Hctz [Hydrochlorothiazide]     Dizzy    Repatha [Evolocumab] Other (See Comments)    Per patient never took this medication    Patient Measurements: Height: 5' 4.5" (163.8 cm) Weight: 68 kg (149 lb 14.6 oz) IBW/kg (Calculated) : 55.85   Vital Signs: Temp: 98.9 F (37.2 C) (10/21 1234) Temp Source: Oral (10/21 1234) BP: 108/57 (10/21 1234) Pulse Rate: 100 (10/21 1234)  Labs: Recent Labs    06/05/22 2000 06/06/22 0035 06/06/22 0619 06/06/22 1405 06/06/22 1850 06/07/22 0600 06/07/22 1438  HGB 10.8*   < > 8.4* 11.1* 10.0* 9.6*  --   HCT 32.5*   < > 24.5* 32.5* 29.7* 27.8*  --   PLT 414*   < > 331  --  316 237  --   APTT  --   --   --   --  35 53* 46*  LABPROT  --   --  16.9*  --   --   --   --   INR  --   --  1.4*  --   --   --   --   HEPARINUNFRC  --   --   --   --  0.11* 0.24* 0.24*  CREATININE 0.79  --  0.74  --   --  0.94  --    < > = values in this interval not displayed.     Estimated Creatinine Clearance: 55.7 mL/min (by C-G formula based on SCr of 0.94 mg/dL).   Medical History: Past Medical History:  Diagnosis Date   Back pain    CAD S/P percutaneous coronary angioplasty 09/13/2018   09/13/2018 - Cath & Staged DES PCI on 09/14/2018: DES PCI: Cx99%&55% - Resolute Onyx DES 3.5 x 30 (3.6 mm), p-mLAD (prox of D1 almost to D2) - Resolute Onyx DES 3.0 x 22 (3.3 mm); DFR on pRCA 70% - 0.99, Not significant -> Rec Med Rx.   DVT (deep venous thrombosis) ([place])    Heart attack ([place]) 09/09/2018   Hypertension    Lupus anticoagulant syndrome  (HCC)    NSTEMI (non-ST elevated myocardial infarction) ([place]) 09/12/2018   Initial presentation with chest pain was on 09/09/2018, recurrent pain on 1/26 2020 with positive troponins.  Cath showed three-vessel CAD -> declined CABG, opted for two-vessel DES PCI (LAD and CX, negative DFR RCA)   Pulmonary embolism (HCC)     Assessment: 66 y/o F on Xarelto PTA for lupus anti-coagulant and history of VTE. Recent SFA intervention of 10/16. Found to have pseudoaneurysm and active bleeding. Pt now s/p OR with vascular. Consulted to start pt on heparin at 1200 today. Last Xarelto dose 10/18 at 2030.   Heparin level (0.24) and PTT (46) remain slightly subtherapeutic. Heparin level and PTT seem to be correlating but we will check one more time.  No issues with infusion or bleeding noted.   Goal of Therapy:  Heparin level 0.3-0.5 units/ml aPTT 66-84 seconds Monitor platelets by anticoagulation protocol: Yes   Plan:  Increase heparin drip  to 1250 units/hr Recheck aPTT and heparin level in 8 hrs. Daily CBC, aPTT, heparin level Monitor closely for any further bleeding  [place] Crane, Vena [place], BCPS, BCCP Clinical Pharmacist  06/07/2022 4:16 PM   [facility] pharmacy phone numbers are listed on [email or web address]

## 2022-06-07 NOTE — Progress Notes (Addendum)
Rounding Note    Patient Name: [name] [name] Date of Encounter: 06/07/2022  [place] Cardiologist: Glenetta Hew, MD   Subjective   Fever to 103.109F yesterday.  CXR clear.  Blood cultures NGTD.  UA with 21-50 WBCs, urine culture sent.  This morning BP 113/46. Denies any chest pain or dyspnea.  Hgb stable at 9.6  Inpatient Medications    Scheduled Meds:  sodium chloride   Intravenous Once   amLODipine  2.5 mg Oral BID   aspirin EC  81 mg Oral Daily   carvedilol  3.125 mg Oral BID WC   fenofibrate  160 mg Oral Daily   lisinopril  40 mg Oral Daily   Continuous Infusions:  heparin 1,150 Units/hr (06/07/22 0908)   PRN Meds: acetaminophen, alum & mag hydroxide-simeth, bisacodyl, HYDROmorphone (DILAUDID) injection, ondansetron, oxyCODONE   Vital Signs    Vitals:   06/07/22 0033 06/07/22 0427 06/07/22 0442 06/07/22 0816  BP: (!) 104/42 (!) 115/47 (!) 102/42 (!) 113/46  Pulse: 97 92 90 100  Resp: 19 20 (!) 21 20  Temp: 99.2 F (37.3 C) 99.9 F (37.7 C) 98.8 F (37.1 C) 98.9 F (37.2 C)  TempSrc: Oral Oral Oral Oral  SpO2: 95% 95% 95% 92%  Weight:      Height:        Intake/Output Summary (Last 24 hours) at 06/07/2022 1053 Last data filed at 06/07/2022 4627 Gross per 24 hour  Intake 697.53 ml  Output 560 ml  Net 137.53 ml       06/05/2022    1:35 PM 06/02/2022   10:06 AM 05/23/2022    8:28 AM  Last 3 Weights  Weight (lbs) 149 lb 14.6 oz 150 lb 150 lb  Weight (kg) 68 kg 68.04 kg 68.04 kg      Telemetry    Sinus rhythm - Personally Reviewed  ECG    No new tracing  Physical Exam   GEN: No acute distress.   Neck: No JVD Cardiac: RRR, no murmurs, rubs, or gallops.  Respiratory: Clear to auscultation bilaterally. GI: Soft, nontender, non-distended  MS: left groin with ecchymosis across to pubis and down proximal thigh, wound vac in place. Strong distal pulses. Bilateral lower extremities warm and well-perfused. Neuro:  Nonfocal   Psych: Normal affect   Labs    High Sensitivity Troponin:  No results for input(s): "TROPONINIHS" in the last 720 hours.   Chemistry Recent Labs  Lab 06/05/22 2000 06/06/22 0338 06/06/22 0619 06/07/22 0600  NA 137 136 136 131*  K 3.2* 3.6 3.7 3.7  CL 102  --  105 102  CO2 22  --  22 22  GLUCOSE 120*  --  111* 101*  BUN 6*  --  6* 11  CREATININE 0.79  --  0.74 0.94  CALCIUM 9.5  --  8.6* 8.1*  PROT 6.4*  --   --  5.1*  ALBUMIN 3.4*  --   --  2.5*  AST 19  --   --  15  ALT 17  --   --  10  ALKPHOS 48  --   --  36*  BILITOT 0.6  --   --  1.0  GFRNONAA >60  --  >60 >60  ANIONGAP 13  --  9 7     Lipids  Recent Labs  Lab 06/03/22 0519  CHOL 207*  TRIG 327*  HDL 26*  LDLCALC 116*  CHOLHDL 8.0     Hematology Recent Labs  Lab 06/06/22 0619 06/06/22 1405 06/06/22 1850 06/07/22 0600  WBC 9.3  --  14.8* 15.8*  RBC 2.86*  --  3.54* 3.30*  HGB 8.4* 11.1* 10.0* 9.6*  HCT 24.5* 32.5* 29.7* 27.8*  MCV 85.7  --  83.9 84.2  MCH 29.4  --  28.2 29.1  MCHC 34.3  --  33.7 34.5  RDW 14.6  --  17.1* 16.8*  PLT 331  --  316 237    Thyroid No results for input(s): "TSH", "FREET4" in the last 168 hours.  BNPNo results for input(s): "BNP", "PROBNP" in the last 168 hours.  DDimer No results for input(s): "DDIMER" in the last 168 hours.   Radiology    DG CHEST PORT 1 VIEW  Result Date: 06/06/2022 CLINICAL DATA:  Fever. EXAM: PORTABLE CHEST 1 VIEW COMPARISON:  Chest radiograph dated 02/03/2022. FINDINGS: No focal consolidation, pleural effusion, or pneumothorax. The cardiac silhouette is within normal limits. Atherosclerotic calcification of the aorta. No acute osseous pathology. IMPRESSION: No active disease. Electronically Signed   By: Anner Crete M.D.   On: 06/06/2022 18:38   VAS [place] GROIN PSEUDOANEURYSM  Result Date: 06/06/2022  ARTERIAL PSEUDOANEURYSM  Patient Name:  [name] [name]  Date of Exam:   06/05/2022 Medical Rec #: [id number]            Accession #:     [id number] Date of Birth: [birth year]            Patient Gender: F Patient Age:   66 years Exam Location:  [facility] Procedure:      VAS [place] GROIN PSEUDOANEURYSM Referring Phys: Cherlynn June --------------------------------------------------------------------------------  Exam: Left groin Indications: Patient complains of groin pain and bruising. Limitations: poor ultrasound/tissue interface Comparison Study: No prior studies. Performing Technologist: Oliver Hum RVT  Examination Guidelines: A complete evaluation includes B-mode imaging, spectral Doppler, color Doppler, and power Doppler as needed of all accessible portions of each vessel. Bilateral testing is considered an integral part of a complete examination. Limited examinations for reoccurring indications may be performed as noted. +-----------+----------+-----------+------+----------+ Left DuplexPSV (cm/s) Waveform  PlaqueComment(s) +-----------+----------+-----------+------+----------+ CFA                  multiphasic                 +-----------+----------+-----------+------+----------+ Prox SFA             Multiphasic                 +-----------+----------+-----------+------+----------+ Left Vein comments:  Summary: Negative for obvious evidence of left groin pseudoaneurysm. Large anechoic area suggestive of hematoma is noted in the left groin. Diagnosing physician: Servando Snare MD Electronically signed by Servando Snare MD on 06/06/2022 at 5:27:41 PM.    --------------------------------------------------------------------------------    Final    CT Angio Pelvis W and/or Wo Contrast  Result Date: 06/06/2022 CLINICAL DATA:  Abdominal pain; postop catheterization through left groin significant bruising and firmness around the belly mons pubis and upper thigh EXAM: CTA PELVIS WITH CONTRAST TECHNIQUE: Multidetector CT imaging of the pelvis was performed using the standard protocol following the bolus administration of  intravenous contrast. RADIATION DOSE REDUCTION: This exam was performed according to the departmental dose-optimization program which includes automated exposure control, adjustment of the mA and/or kV according to patient size and/or use of iterative reconstruction technique. CONTRAST:  43m OMNIPAQUE IOHEXOL 350 MG/ML SOLN COMPARISON:  None Available. FINDINGS: Urinary Tract:  No abnormality visualized. Bowel:  Unremarkable visualized pelvic bowel  loops. Vascular/Lymphatic: Left common femoral pseudoaneurysm measuring 7 mm (series 5/image 90 with active contrast extravasation into a 6.9 cm hematoma within the left inguinal region (series 5/image 86. Marked edema within the left flank extending into the midline abdomen and mons pubis. No suspicious lymphadenopathy.  Bilateral common iliac vein stents. Reproductive:  No mass or other significant abnormality Other:  None. Musculoskeletal: Partially visualized lumbar spine fusion. No acute abnormality. IMPRESSION: Pseudoaneurysm with active bleeding from the left common femoral artery access site into a 6.9 cm left inguinal hematoma. These results were called by telephone at the time of interpretation on 06/06/2022 at 1:17 am to provider RILEY RANSOM , who verbally acknowledged these results. Electronically Signed   By: Placido Sou M.D.   On: 06/06/2022 01:18    Cardiac Studies   09/14/2018 LHC with PCI  Prox LAD-1 lesion is 35% stenosed with 20% stenosed side branch in Ost 1st Diag. Prox LAD-2 lesion is 80% stenosed with 90% stenosed side branch in Ost 1st Sept. A drug-eluting stent was successfully placed using a STENT RESOLUTE ONYX 3.0X22. Post intervention, there is a 0% residual stenosis. Prox Cx to Mid Cx lesion is 99% stenosed. Mid Cx lesion is 55% stenosed. A drug-eluting stent was successfully placed using a STENT RESOLUTE ONYX 3.5X30. Post intervention, there is a 0% residual stenosis. Prox RCA lesion is 70% stenosed. DFR was 0.99   1.  Successful PCI of the mid LCx with DES x 1 2. Successful PCI of the proximal to mid LAD with DES x 1. 3. Proximal RCA lesion with normal DFR of 0.99  Diagnostic Dominance: Right  Intervention   Diagnostic Dominance: Right  Intervention    06/05/2022 Vas [place] Lower Extremity  +-----------+----------+-----------+------+----------+  Left DuplexPSV (cm/s) Waveform  PlaqueComment(s)  +-----------+----------+-----------+------+----------+  CFA                  multiphasic                  +-----------+----------+-----------+------+----------+  Prox SFA             Multiphasic                  +-----------+----------+-----------+------+----------+   Left Vein comments:     Summary:  Negative for obvious evidence of left groin pseudoaneurysm. Large anechoic  area  suggestive of hematoma is noted in the left groin.   Patient Profile     Ionna Avis is a 66 y.o. female with a hx of HTN, CAD s/p LAD/Lcx PCI, lupus, history of DVT/PE on chronic anticoagulation who is being seen today for the evaluation of left groin hematoma at the request of emergency department. Patient underwent left lower extremity SFA intervention with Dr. Gwenlyn Found on 06/02/2022. Given her history of lupus anticoagulant, she was continued on aspirin, Plavix, and rivaroxaban  Assessment & Plan    Left common femoral artery pseudoaneurysm with active bleeding hematoma Lower extremity PAD s/p L SFA intervention 06/02/2022 Patient with increasing pain following her peripheral intervention. CTA pelvis notable for a pseudoaneurysm with active bleeding from left common femoral artery access site into a 6.9 left inguinal hematoma. Patient underwent left thigh exploration with vascular surgery with evacuation of large hematoma and suture hemostasis of arteriotomy.  Per vascular surgery, VAC dressing to remain in place for 7 days.  Plan had been for triple therapy x 1 month then drop ASA. D/w vascular  surgery: recommend adding plavix today, continue heparin gtt. If stable can switch  to Xarelto tomorrow  CAD s/p PCI to LAD and Lcx Hypertriglyceridemia Continue ASA, restart plavix once OK per vascular surgery. Continue Fenofibrate. Patient reportedly intolerant of statins.   Hypertension BP soft, will hold Amlodipine, Carvedilol, decrease dose of Lisinopril  History of DVT/PE with Lupus anticoagulant Patient with significant risk of clotting. Resuming Heparin per Vascular. Plan switch to xarelto tomorrow if continues to tolerate  Fever To 103.7 on 10/20. Unclear source, could be 2/2 hematoma.  CXR clear.  Blood cultures NGTD.  UA with 21-50 WBCs, urine culture sent.  Denies any dysuria. Check procalcitonin.  D/w IM, recommend checking COVID swab.  -Diet heart healthy -DVT PPx: heparin gtt -Code: Full    For questions or updates, please contact [place] Please consult [email or web address] for contact info under        Signed, Donato Heinz, MD  06/07/2022, 10:53 AM

## 2022-06-08 DIAGNOSIS — I10 Essential (primary) hypertension: Secondary | ICD-10-CM

## 2022-06-08 DIAGNOSIS — D6862 Lupus anticoagulant syndrome: Secondary | ICD-10-CM

## 2022-06-08 DIAGNOSIS — I724 Aneurysm of artery of lower extremity: Secondary | ICD-10-CM | POA: Diagnosis not present

## 2022-06-08 DIAGNOSIS — R509 Fever, unspecified: Secondary | ICD-10-CM | POA: Diagnosis not present

## 2022-06-08 LAB — CBC
HCT: 28.4 % — ABNORMAL LOW (ref 36.0–46.0)
Hemoglobin: 9.5 g/dL — ABNORMAL LOW (ref 12.0–15.0)
MCH: 28.3 pg (ref 26.0–34.0)
MCHC: 33.5 g/dL (ref 30.0–36.0)
MCV: 84.5 fL (ref 80.0–100.0)
Platelets: 338 10*3/uL (ref 150–400)
RBC: 3.36 MIL/uL — ABNORMAL LOW (ref 3.87–5.11)
RDW: 16.3 % — ABNORMAL HIGH (ref 11.5–15.5)
WBC: 13.6 10*3/uL — ABNORMAL HIGH (ref 4.0–10.5)
nRBC: 0 % (ref 0.0–0.2)

## 2022-06-08 LAB — BASIC METABOLIC PANEL
Anion gap: 10 (ref 5–15)
BUN: 11 mg/dL (ref 8–23)
CO2: 21 mmol/L — ABNORMAL LOW (ref 22–32)
Calcium: 8.9 mg/dL (ref 8.9–10.3)
Chloride: 105 mmol/L (ref 98–111)
Creatinine, Ser: 0.85 mg/dL (ref 0.44–1.00)
GFR, Estimated: >60 mL/min (ref >60)
Glucose, Bld: 110 mg/dL — ABNORMAL HIGH (ref 70–99)
Potassium: 3.8 mmol/L (ref 3.5–5.1)
Sodium: 136 mmol/L (ref 135–145)

## 2022-06-08 LAB — APTT: aPTT: 60 seconds — ABNORMAL HIGH (ref 24–36)

## 2022-06-08 LAB — URINE CULTURE: Culture: NO GROWTH

## 2022-06-08 LAB — HEPARIN LEVEL (UNFRACTIONATED)
Heparin Unfractionated: 0.29 IU/mL — ABNORMAL LOW (ref 0.30–0.70)
Heparin Unfractionated: 0.25 IU/mL — ABNORMAL LOW (ref 0.30–0.70)

## 2022-06-08 LAB — PROCALCITONIN: Procalcitonin: 0.79 ng/mL

## 2022-06-08 MED ORDER — CARVEDILOL 3.125 MG PO TABS
3.1250 mg | ORAL_TABLET | Freq: Two times a day (BID) | ORAL | Status: DC
  Administered 2022-06-08 – 2022-06-09 (×2): 3.125 mg via ORAL
  Filled 2022-06-08 (×2): qty 1

## 2022-06-08 MED ORDER — RIVAROXABAN 20 MG PO TABS
20.0000 mg | ORAL_TABLET | Freq: Every day | ORAL | Status: DC
  Administered 2022-06-08: 20 mg via ORAL
  Filled 2022-06-08: qty 1

## 2022-06-08 NOTE — Progress Notes (Signed)
ANTICOAGULATION CONSULT NOTE - Follow-up Consult  Pharmacy Consult for Heparin (Xarelto on hold) Indication: Lupus anti-coagulant, history of VTE  Allergies  Allergen Reactions   Prednisone Other (See Comments)    No energy "stalls out".    Makes pt  Jittery.   Tramadol Other (See Comments)    Constipation,  Makes pt  Jittery.   Crestor [Rosuvastatin Calcium]     Feels fatigued, RE  CHALLENGE - UNABLE TO USE 12/28/18   Hctz [Hydrochlorothiazide]     Dizzy    Repatha [Evolocumab] Other (See Comments)    Per patient never took this medication    Patient Measurements: Height: 5' 4.5" (163.8 cm) Weight: 68 kg (149 lb 14.6 oz) IBW/kg (Calculated) : 55.85   Vital Signs: Temp: 98.1 F (36.7 C) (10/21 2001) Temp Source: Oral (10/21 2001) BP: 112/62 (10/21 2001) Pulse Rate: 90 (10/21 2001)  Labs: Recent Labs    06/06/22 0619 06/06/22 1405 06/06/22 1850 06/07/22 0600 06/07/22 1438 06/08/22 0157  HGB 8.4*   < > 10.0* 9.6*  --  9.5*  HCT 24.5*   < > 29.7* 27.8*  --  28.4*  PLT 331  --  316 237  --  338  APTT  --    < > 35 53* 46* 60*  LABPROT 16.9*  --   --   --   --   --   INR 1.4*  --   --   --   --   --   HEPARINUNFRC  --    < > 0.11* 0.24* 0.24* 0.29*  CREATININE 0.74  --   --  0.94  --  0.85   < > = values in this interval not displayed.     Estimated Creatinine Clearance: 61.5 mL/min (by C-G formula based on SCr of 0.85 mg/dL).   Medical History: Past Medical History:  Diagnosis Date   Back pain    CAD S/P percutaneous coronary angioplasty 09/13/2018   09/13/2018 - Cath & Staged DES PCI on 09/14/2018: DES PCI: Cx99%&55% - Resolute Onyx DES 3.5 x 30 (3.6 mm), p-mLAD (prox of D1 almost to D2) - Resolute Onyx DES 3.0 x 22 (3.3 mm); DFR on pRCA 70% - 0.99, Not significant -> Rec Med Rx.   DVT (deep venous thrombosis) ([place])    Heart attack ([place]) 09/09/2018   Hypertension    Lupus anticoagulant syndrome (HCC)    NSTEMI (non-ST elevated myocardial infarction) ([place])  09/12/2018   Initial presentation with chest pain was on 09/09/2018, recurrent pain on 1/26 2020 with positive troponins.  Cath showed three-vessel CAD -> declined CABG, opted for two-vessel DES PCI (LAD and CX, negative DFR RCA)   Pulmonary embolism (HCC)     Assessment: 66 y/o F on Xarelto PTA for lupus anti-coagulant and history of VTE. Recent SFA intervention of 10/16. Found to have pseudoaneurysm and active bleeding. Pt now s/p OR with vascular. Consulted to start pt on heparin at 1200 today. Last Xarelto dose 10/18 at 2030.   Heparin level (0.24) and PTT (46) remain slightly subtherapeutic. Heparin level and PTT seem to be correlating but we will check one more time.  No issues with infusion or bleeding noted.   10/22 AM update:  Heparin level just below goal No need for further aPTT's  Goal of Therapy:  Heparin level 0.3-0.5 units/mL Monitor platelets by anticoagulation protocol: Yes   Plan:  Increase heparin drip to 1350 units/hr Recheck heparin level in 8 hrs. Daily CBC,heparin level Monitor  closely for any bleeding  Narda Bonds, PharmD, BCPS Clinical Pharmacist Phone: [phone]

## 2022-06-08 NOTE — Progress Notes (Addendum)
Rounding Note    Patient Name: [name] [name] Date of Encounter: 06/08/2022  [place] Cardiologist: Glenetta Hew, MD   Subjective   No fevers yesterday. PCT elevated at 0.8.  Hgb stable at 9.5.  Denies any chest pain or dyspnea  Inpatient Medications    Scheduled Meds:  sodium chloride   Intravenous Once   aspirin EC  81 mg Oral Daily   clopidogrel  75 mg Oral Daily   fenofibrate  160 mg Oral Daily   lisinopril  10 mg Oral Daily   Continuous Infusions:  heparin 1,350 Units/hr (06/08/22 1121)   PRN Meds: acetaminophen, alum & mag hydroxide-simeth, bisacodyl, HYDROmorphone (DILAUDID) injection, ondansetron, oxyCODONE   Vital Signs    Vitals:   06/07/22 1629 06/07/22 2001 06/08/22 0553 06/08/22 1116  BP: (!) 115/48 112/62 (!) 129/57 (!) 126/56  Pulse: 92 90 95 92  Resp: '19 18 20 20  '$ Temp: 98.9 F (37.2 C) 98.1 F (36.7 C) 98.1 F (36.7 C) 98.4 F (36.9 C)  TempSrc: Oral Oral Oral Oral  SpO2: 100% 100% 95% 99%  Weight:      Height:        Intake/Output Summary (Last 24 hours) at 06/08/2022 1144 Last data filed at 06/08/2022 0325 Gross per 24 hour  Intake 249.37 ml  Output --  Net 249.37 ml       06/05/2022    1:35 PM 06/02/2022   10:06 AM 05/23/2022    8:28 AM  Last 3 Weights  Weight (lbs) 149 lb 14.6 oz 150 lb 150 lb  Weight (kg) 68 kg 68.04 kg 68.04 kg      Telemetry    Sinus rhythm - Personally Reviewed  ECG    No new tracing  Physical Exam   GEN: No acute distress.   Neck: No JVD Cardiac: RRR, no murmurs, rubs, or gallops.  Respiratory: Clear to auscultation bilaterally. GI: Soft, nontender, non-distended  MS: left groin with ecchymosis extending down proximal thigh, wound vac in place. Bilateral lower extremities warm and well-perfused. Neuro:  Nonfocal  Psych: Normal affect   Labs    High Sensitivity Troponin:  No results for input(s): "TROPONINIHS" in the last 720 hours.   Chemistry Recent Labs  Lab  06/05/22 2000 06/06/22 0338 06/06/22 0619 06/07/22 0600 06/08/22 0157  NA 137   < > 136 131* 136  K 3.2*   < > 3.7 3.7 3.8  CL 102  --  105 102 105  CO2 22  --  22 22 21*  GLUCOSE 120*  --  111* 101* 110*  BUN 6*  --  6* 11 11  CREATININE 0.79  --  0.74 0.94 0.85  CALCIUM 9.5  --  8.6* 8.1* 8.9  PROT 6.4*  --   --  5.1*  --   ALBUMIN 3.4*  --   --  2.5*  --   AST 19  --   --  15  --   ALT 17  --   --  10  --   ALKPHOS 48  --   --  36*  --   BILITOT 0.6  --   --  1.0  --   GFRNONAA >60  --  >60 >60 >60  ANIONGAP 13  --  '9 7 10   '$ < > = values in this interval not displayed.     Lipids  Recent Labs  Lab 06/03/22 0519  CHOL 207*  TRIG 327*  HDL 26*  LDLCALC 116*  CHOLHDL 8.0     Hematology Recent Labs  Lab 06/06/22 1850 06/07/22 0600 06/08/22 0157  WBC 14.8* 15.8* 13.6*  RBC 3.54* 3.30* 3.36*  HGB 10.0* 9.6* 9.5*  HCT 29.7* 27.8* 28.4*  MCV 83.9 84.2 84.5  MCH 28.2 29.1 28.3  MCHC 33.7 34.5 33.5  RDW 17.1* 16.8* 16.3*  PLT 316 237 338    Thyroid No results for input(s): "TSH", "FREET4" in the last 168 hours.  BNPNo results for input(s): "BNP", "PROBNP" in the last 168 hours.  DDimer No results for input(s): "DDIMER" in the last 168 hours.   Radiology    DG CHEST PORT 1 VIEW  Result Date: 06/06/2022 CLINICAL DATA:  Fever. EXAM: PORTABLE CHEST 1 VIEW COMPARISON:  Chest radiograph dated 02/03/2022. FINDINGS: No focal consolidation, pleural effusion, or pneumothorax. The cardiac silhouette is within normal limits. Atherosclerotic calcification of the aorta. No acute osseous pathology. IMPRESSION: No active disease. Electronically Signed   By: Anner Crete M.D.   On: 06/06/2022 18:38    Cardiac Studies   09/14/2018 LHC with PCI  Prox LAD-1 lesion is 35% stenosed with 20% stenosed side branch in Ost 1st Diag. Prox LAD-2 lesion is 80% stenosed with 90% stenosed side branch in Ost 1st Sept. A drug-eluting stent was successfully placed using a STENT  RESOLUTE ONYX 3.0X22. Post intervention, there is a 0% residual stenosis. Prox Cx to Mid Cx lesion is 99% stenosed. Mid Cx lesion is 55% stenosed. A drug-eluting stent was successfully placed using a STENT RESOLUTE ONYX 3.5X30. Post intervention, there is a 0% residual stenosis. Prox RCA lesion is 70% stenosed. DFR was 0.99   1. Successful PCI of the mid LCx with DES x 1 2. Successful PCI of the proximal to mid LAD with DES x 1. 3. Proximal RCA lesion with normal DFR of 0.99  Diagnostic Dominance: Right  Intervention   Diagnostic Dominance: Right  Intervention    06/05/2022 Vas [place] Lower Extremity  +-----------+----------+-----------+------+----------+  Left DuplexPSV (cm/s) Waveform  PlaqueComment(s)  +-----------+----------+-----------+------+----------+  CFA                  multiphasic                  +-----------+----------+-----------+------+----------+  Prox SFA             Multiphasic                  +-----------+----------+-----------+------+----------+   Left Vein comments:     Summary:  Negative for obvious evidence of left groin pseudoaneurysm. Large anechoic  area  suggestive of hematoma is noted in the left groin.   Patient Profile     [name] [name] is a 66 y.o. female with a hx of HTN, CAD s/p LAD/Lcx PCI, lupus, history of DVT/PE on chronic anticoagulation who is being seen today for the evaluation of left groin hematoma at the request of emergency department. Patient underwent left lower extremity SFA intervention with Dr. Gwenlyn Found on 06/02/2022. Given her history of lupus anticoagulant, she was continued on aspirin, Plavix, and rivaroxaban  Assessment & Plan    Left common femoral artery pseudoaneurysm with active bleeding hematoma Lower extremity PAD s/p L SFA intervention 06/02/2022 Patient with increasing pain following her peripheral intervention. CTA pelvis notable for a pseudoaneurysm with active bleeding from left common  femoral artery access site into a 6.9 left inguinal hematoma. Patient underwent left thigh exploration with vascular surgery with evacuation of large hematoma and  suture hemostasis of arteriotomy.  Per vascular surgery, VAC dressing to remain in place for 7 days.  Hgb stable. Plan had been for triple therapy x 1 month then drop ASA. D/w vascular surgery: Has been continued on ASA, plavix added yesterday, has been stable on heparin gtt will transition to Xarelto.   CAD s/p PCI to LAD and Lcx Hypertriglyceridemia Continue ASA, restarted plavix as above. Continue Fenofibrate. Patient reportedly intolerant of statins.   Hypertension BP soft, held Amlodipine, Carvedilol, and decreased dose of Lisinopril.  BP improved, will add back coreg  History of DVT/PE with Lupus anticoagulant Patient with significant risk of clotting.  Plan switch to xarelto today from heparin gtt  Fever To 103.7 on 10/20. Unclear source.  PCT 0.8.  CXR clear.  COVID negative. Blood cultures NGTD.  UA with 21-50 WBCs, urine culture negative, no urinary symptoms.  D/w IM, recommend holding off on treatment, suspect fever from hematoma.  Disposition Plan discharge tomorrow if no bleeding with adding xarelto  -Diet heart healthy -DVT PPx: heparin gtt -Code: Full    For questions or updates, please contact [place] Please consult [email or web address] for contact info under        Signed, Donato Heinz, MD  06/08/2022, 11:44 AM

## 2022-06-08 NOTE — Progress Notes (Signed)
ANTICOAGULATION CONSULT NOTE - Follow-up Consult  Pharmacy Consult for transition from Heparin to Xarelto Indication: Lupus anti-coagulant, history of VTE  Allergies  Allergen Reactions   Prednisone Other (See Comments)    No energy "stalls out".    Makes pt  Jittery.   Tramadol Other (See Comments)    Constipation,  Makes pt  Jittery.   Crestor [Rosuvastatin Calcium]     Feels fatigued, RE  CHALLENGE - UNABLE TO USE 12/28/18   Hctz [Hydrochlorothiazide]     Dizzy    Repatha [Evolocumab] Other (See Comments)    Per patient never took this medication    Patient Measurements: Height: 5' 4.5" (163.8 cm) Weight: 68 kg (149 lb 14.6 oz) IBW/kg (Calculated) : 55.85   Vital Signs: Temp: 98.4 F (36.9 C) (10/22 1116) Temp Source: Oral (10/22 1116) BP: 126/56 (10/22 1116) Pulse Rate: 92 (10/22 1116)  Labs: Recent Labs    06/06/22 0619 06/06/22 1405 06/06/22 1850 06/07/22 0600 06/07/22 1438 06/08/22 0157 06/08/22 1144  HGB 8.4*   < > 10.0* 9.6*  --  9.5*  --   HCT 24.5*   < > 29.7* 27.8*  --  28.4*  --   PLT 331  --  316 237  --  338  --   APTT  --    < > 35 53* 46* 60*  --   LABPROT 16.9*  --   --   --   --   --   --   INR 1.4*  --   --   --   --   --   --   HEPARINUNFRC  --    < > 0.11* 0.24* 0.24* 0.29* 0.25*  CREATININE 0.74  --   --  0.94  --  0.85  --    < > = values in this interval not displayed.     Estimated Creatinine Clearance: 61.5 mL/min (by C-G formula based on SCr of 0.85 mg/dL).   Medical History: Past Medical History:  Diagnosis Date   Back pain    CAD S/P percutaneous coronary angioplasty 09/13/2018   09/13/2018 - Cath & Staged DES PCI on 09/14/2018: DES PCI: Cx99%&55% - Resolute Onyx DES 3.5 x 30 (3.6 mm), p-mLAD (prox of D1 almost to D2) - Resolute Onyx DES 3.0 x 22 (3.3 mm); DFR on pRCA 70% - 0.99, Not significant -> Rec Med Rx.   DVT (deep venous thrombosis) ([place])    Heart attack ([place]) 09/09/2018   Hypertension    Lupus anticoagulant  syndrome (H[facility])    NSTEMI (non-ST elevated myocardial infarction) ([place]) 09/12/2018   Initial presentation with chest pain was on 09/09/2018, recurrent pain on 1/26 2020 with positive troponins.  Cath showed three-vessel CAD -> declined CABG, opted for two-vessel DES PCI (LAD and CX, negative DFR RCA)   Pulmonary embolism (HCC)     Assessment: 66 y/o F on Xarelto PTA for lupus anti-coagulant and history of VTE. Recent SFA intervention of 10/16. Found to have pseudoaneurysm and active bleeding. Pt now s/p OR with vascular. Consulted to start pt on heparin at 1200 today. Last Xarelto dose 10/18 at 2030.   CBC stable. Pharmacy consulted to transition heparin infusion to Xarelto.   Goal of Therapy:  Monitor platelets by anticoagulation protocol: Yes   Plan:  Stop heparin infusion at the time of Xarelto administration  Start Xarelto 20 mg PO daily with supper  Monitor for s/sx of bleeding  Francena Hanly, PharmD Pharmacy Resident  06/08/2022  12:39 PM

## 2022-06-08 NOTE — Progress Notes (Signed)
  Progress Note    06/08/2022 8:48 AM 2 Days Post-Op  Subjective: Pain much improved, wants to go home   Vitals:   06/07/22 2001 06/08/22 0553  BP: 112/62 (!) 129/57  Pulse: 90 95  Resp: 18 20  Temp: 98.1 F (36.7 C) 98.1 F (36.7 C)  SpO2: 100% 95%    Physical Exam: Awake alert oriented Left groin is soft with stable hematoma and Prevena VAC in place Both feet are warm and well-perfused  CBC    Component Value Date/Time   WBC 13.6 (H) 06/08/2022 0157   RBC 3.36 (L) 06/08/2022 0157   HGB 9.5 (L) 06/08/2022 0157   HGB 13.8 05/23/2022 1004   HGB 12.9 09/22/2013 1213   HCT 28.4 (L) 06/08/2022 0157   HCT 40.4 05/23/2022 1004   HCT 38.4 09/22/2013 1213   PLT 338 06/08/2022 0157   PLT 496 (H) 05/23/2022 1004   MCV 84.5 06/08/2022 0157   MCV 87 05/23/2022 1004   MCV 87.7 09/22/2013 1213   MCH 28.3 06/08/2022 0157   MCHC 33.5 06/08/2022 0157   RDW 16.3 (H) 06/08/2022 0157   RDW 12.2 05/23/2022 1004   RDW 13.2 09/22/2013 1213   LYMPHSABS 2.1 06/05/2022 2000   LYMPHSABS 2.6 11/15/2021 1133   LYMPHSABS 2.7 09/22/2013 1213   MONOABS 1.0 06/05/2022 2000   MONOABS 0.5 09/22/2013 1213   EOSABS 0.2 06/05/2022 2000   EOSABS 0.2 11/15/2021 1133   BASOSABS 0.0 06/05/2022 2000   BASOSABS 0.0 11/15/2021 1133   BASOSABS 0.1 09/22/2013 1213    BMET    Component Value Date/Time   NA 136 06/08/2022 0157   NA 142 05/23/2022 1004   NA 142 09/22/2013 1213   K 3.8 06/08/2022 0157   K 3.7 09/22/2013 1213   CL 105 06/08/2022 0157   CL 105 09/16/2012 0821   CO2 21 (L) 06/08/2022 0157   CO2 24 09/22/2013 1213   GLUCOSE 110 (H) 06/08/2022 0157   GLUCOSE 93 09/22/2013 1213   GLUCOSE 82 09/16/2012 0821   BUN 11 06/08/2022 0157   BUN 6 (L) 05/23/2022 1004   BUN 7.6 09/22/2013 1213   CREATININE 0.85 06/08/2022 0157   CREATININE 0.7 09/22/2013 1213   CALCIUM 8.9 06/08/2022 0157   CALCIUM 10.1 09/22/2013 1213   GFRNONAA >60 06/08/2022 0157   GFRAA 98 06/14/2020 0955     INR    Component Value Date/Time   INR 1.4 (H) 06/06/2022 0619     Intake/Output Summary (Last 24 hours) at 06/08/2022 0848 Last data filed at 06/08/2022 0325 Gross per 24 hour  Intake 249.37 ml  Output 400 ml  Net -150.63 ml     Assessment:  66 y.o. female is s/p repair of left common femoral pseudoaneurysm with Prevena VAC in place  Plan: From a wound standpoint she is okay for discharge with a Prevena wound VAC and will follow-up in our office in 2 weeks which has been arranged.   Satcha Storlie C. Donzetta Matters, MD [facility] Office: [phone] Pager: [phone]  06/08/2022 8:48 AM

## 2022-06-08 NOTE — Consult Note (Signed)
History and Physical    [name] [name]:[id number] DOB: 08-10-1956 DOA: 06/05/2022  PCP: Billie Ruddy, MD (Confirm with patient/family/NH records and if not entered, this has to be entered at [facility] point of entry) Patient coming from: Home  I have personally briefly reviewed patient's old medical records in [place]  Chief Complaint: Feeling ok  HPI: [name] [name] is a 66 y.o. female with medical history significant of CAD status post stenting, PAD with recent right-sided anterior tibial artery occlusion status post ballooning on 06/02/2022, lupus anticoagulant syndrome on Eliquis, PE, was hospitalized for a large left groin pseudoaneurysm bleeding and hematoma.  3 days ago, patient developed large left groin hematoma after the vascular intervention done last week.  And patient came to hospital 2 days ago, underwent emergent intervention including left groin hematoma evacuation and primary repair of the left, 3 and wound VAC dressing placement.  Surgery was done or in the morning around 5 AM.  On same day, patient spiked a fever around 1630 in the afternoon.  Patient denies any cough urinary symptoms or diarrhea that day.  Blood culture x2 sent on Friday remain negative as of today.  UA showed WBC 21-50, patient however is asymptomatic for dysuria urinary frequency or back pain or abdominal pain.  No more fever overnight after given Tylenol.  Next day, fever subsided without antibiotics.  And patient remained asymptomatic denies any left groin pain swelling rash.   Review of Systems: As per HPI otherwise 14 point review of systems negative.    Past Medical History:  Diagnosis Date   Back pain    CAD S/P percutaneous coronary angioplasty 09/13/2018   09/13/2018 - Cath & Staged DES PCI on 09/14/2018: DES PCI: Cx99%&55% - Resolute Onyx DES 3.5 x 30 (3.6 mm), p-mLAD (prox of D1 almost to D2) - Resolute Onyx DES 3.0 x 22 (3.3 mm); DFR on pRCA 70% - 0.99, Not significant ->  Rec Med Rx.   DVT (deep venous thrombosis) ([place])    Heart attack ([place]) 09/09/2018   Hypertension    Lupus anticoagulant syndrome (HCC)    NSTEMI (non-ST elevated myocardial infarction) ([place]) 09/12/2018   Initial presentation with chest pain was on 09/09/2018, recurrent pain on 1/26 2020 with positive troponins.  Cath showed three-vessel CAD -> declined CABG, opted for two-vessel DES PCI (LAD and CX, negative DFR RCA)   Pulmonary embolism ([place])     Past Surgical History:  Procedure Laterality Date   ABDOMINAL AORTOGRAM W/LOWER EXTREMITY N/A 06/02/2022   Procedure: ABDOMINAL AORTOGRAM W/LOWER EXTREMITY;  Surgeon: Lorretta Harp, MD;  Location: [place] [facility];  Service: Cardiovascular;  Laterality: N/A;   BACK SURGERY     COLONOSCOPY  05/19/2012   Procedure: COLONOSCOPY;  Surgeon: Beryle Beams, MD;  Location: [place];  Service: Endoscopy;  Laterality: N/A;   COLONOSCOPY WITH PROPOFOL N/A 09/30/2018   Procedure: COLONOSCOPY WITH PROPOFOL;  Surgeon: Yetta Flock, MD;  Location: [place];  Service: Gastroenterology;  Laterality: N/A;   CORONARY STENT INTERVENTION N/A 09/14/2018   Procedure: CORONARY STENT INTERVENTION;  Surgeon: Martinique, Peter M, MD;  Location: [place] [facility];  Service: Cardiovascular: September 14, 2018- DES PCI: Cx99%&55% - Resolute Onyx DES 3.5 x 30 (3.6 mm), p-mLAD (prox of D1 almost to D2) - Resolute Onyx DES 3.0 x 22 (3.3 mm); DFR on pRCA 70% - 0.99, Not significant -> Rec Med Rx.   ESOPHAGOGASTRODUODENOSCOPY  05/19/2012   Procedure: ESOPHAGOGASTRODUODENOSCOPY (EGD);  Surgeon: Beryle Beams, MD;  Location: [facility] ENDOSCOPY;  Service: Endoscopy;  Laterality: N/A;   ESOPHAGOGASTRODUODENOSCOPY (EGD) WITH PROPOFOL N/A 09/30/2018   Procedure: ESOPHAGOGASTRODUODENOSCOPY (EGD) WITH PROPOFOL;  Surgeon: Yetta Flock, MD;  Location: [place];  Service: Gastroenterology;  Laterality: N/A;   GIVENS CAPSULE STUDY N/A 09/30/2018   Procedure: GIVENS CAPSULE  STUDY;  Surgeon: Yetta Flock, MD;  Location: [place];  Service: Gastroenterology;  Laterality: N/A;   HOT HEMOSTASIS N/A 09/30/2018   Procedure: HOT HEMOSTASIS (ARGON PLASMA COAGULATION/BICAP);  Surgeon: Yetta Flock, MD;  Location: [facility] ENDOSCOPY;  Service: Gastroenterology;  Laterality: N/A;   INTRAVASCULAR PRESSURE WIRE/FFR STUDY N/A 09/14/2018   Procedure: INTRAVASCULAR PRESSURE WIRE/FFR STUDY;  Surgeon: Martinique, Peter M, MD;  Location: [place] [facility];  Service: Cardiovascular;  Laterality: RCA: DFR on pRCA 70% - 0.99, Not significant -> Rec Med Rx.   LEFT HEART CATH AND CORONARY ANGIOGRAPHY N/A 09/13/2018   Procedure: LEFT HEART CATH AND CORONARY ANGIOGRAPHY;  Surgeon: Leonie Man, MD;  Location: [place] [facility];  Service: Cardiovascular:: CULPRIT LESION 99% p-mCx followed by 55%mCx; pLAD 35% A D29fllowed by long 80% lesion @ SP1); pRCA 70%. Normal LVEDP. Global HK EF ~45%.  -Decision on PCI deferred until discussion about PCI versus CABG.  Concern because of long-term DOAC.   PERIPHERAL VASCULAR ATHERECTOMY  06/02/2022   Procedure: PERIPHERAL VASCULAR ATHERECTOMY;  Surgeon: BLorretta Harp MD;  [facility];  Service: Cardiovascular;;   PERIPHERAL VASCULAR BALLOON ANGIOPLASTY  06/02/2022   Procedure: PERIPHERAL VASCULAR BALLOON ANGIOPLASTY;  Surgeon: BLorretta Harp MD;  [facility];  Service: Cardiovascular;;   TRANSTHORACIC ECHOCARDIOGRAM  09/13/2018   Mildly reduced EF 45 to 50% with diffuse HK.  GR 1 DD.  Mild aortic valve calcification.  MAC.  Moderate pulmonic regurgitation.     reports that she has been smoking cigarettes. She has a 14.50 pack-year smoking history. She has never used smokeless tobacco. She reports current alcohol use. She reports that she does not use drugs.  Allergies  Allergen Reactions   Prednisone Other (See Comments)    No energy "stalls out".    Makes pt  Jittery.   Tramadol Other (See  Comments)    Constipation,  Makes pt  Jittery.   Crestor [Rosuvastatin Calcium]     Feels fatigued, RE  CHALLENGE - UNABLE TO USE 12/28/18   Hctz [Hydrochlorothiazide]     Dizzy    Repatha [Evolocumab] Other (See Comments)    Per patient never took this medication    Family History  Problem Relation Age of Onset   Leukemia Mother    Prostate cancer Father    Cancer Father    Stroke Maternal Grandmother    Lung cancer Maternal Grandfather    Leukemia Paternal Grandmother    Cancer Paternal Grandfather    Spina bifida Son        Multiple surgeries   Colon cancer Neg Hx    Esophageal cancer Neg Hx      Prior to Admission medications   Medication Sig Start Date End Date Taking? Authorizing Provider  acetaminophen (TYLENOL) 500 MG tablet Take 500 mg by mouth daily as needed for mild pain or moderate pain.   Yes [provider]  acetaminophen (TYLENOL) 650 MG CR tablet Take 650 mg by mouth daily as needed for pain.   Yes [provider]  amLODipine (NORVASC) 5 MG tablet TAKE 1 TABLET BY MOUTH TWICE  A DAY Patient taking differently: Take 2.5 mg by mouth 2 (two) times daily. 04/28/22  Yes Leonie Man, MD  aspirin EC 81 MG tablet Take 1 tablet (81 mg total) by mouth daily. Swallow whole. 06/04/22  Yes Cheryln Manly, NP  bisacodyl (DULCOLAX) 5 MG EC tablet Take 5 mg by mouth daily as needed for moderate constipation.   Yes [provider]  carvedilol (COREG) 6.25 MG tablet Take 0.5 tablets (3.125 mg total) by mouth 2 (two) times daily with a meal. 08/16/21  Yes Leonie Man, MD  clopidogrel (PLAVIX) 75 MG tablet Take 1 tablet (75 mg total) by mouth daily. 05/23/22  Yes Lorretta Harp, MD  fenofibrate (TRICOR) 145 MG tablet TAKE 1 TABLET (145 MG TOTAL) BY MOUTH DAILY. TAKE WITH A MEAL. Patient taking differently: Take 145 mg by mouth daily. 09/16/21  Yes Leonie Man, MD  lisinopril (ZESTRIL) 40 MG tablet TAKE 1 TABLET BY MOUTH EVERY  DAY Patient taking differently: Take 40 mg by mouth daily. 05/13/22  Yes Billie Ruddy, MD  nitroGLYCERIN (NITROSTAT) 0.4 MG SL tablet Place 1 tablet (0.4 mg total) under the tongue every 5 (five) minutes as needed for up to 3 doses for chest pain. 07/19/20  Yes Leonie Man, MD  rivaroxaban (XARELTO) 20 MG TABS tablet Take 1 tablet (20 mg total) by mouth daily with supper. 06/04/22  Yes Lorretta Harp, MD    Physical Exam: Vitals:   06/07/22 1629 06/07/22 2001 06/08/22 0553 06/08/22 1116  BP: (!) 115/48 112/62 (!) 129/57 (!) 126/56  Pulse: 92 90 95 92  Resp: _0 Temp: 98.9 F (37.2 C) 98.1 F (36.7 C) 98.1 F (36.7 C) 98.4 F (36.9 C)  TempSrc: Oral Oral Oral Oral  SpO2: 100% 100% 95% 99%  Weight:      Height:        Constitutional: NAD, calm, comfortable Vitals:   06/07/22 1629 06/07/22 2001 06/08/22 0553 06/08/22 1116  BP: (!) 115/48 112/62 (!) 129/57 (!) 126/56  Pulse: 92 90 95 92  Resp: _1 Temp: 98.9 F (37.2 C) 98.1 F (36.7 C) 98.1 F (36.7 C) 98.4 F (36.9 C)  TempSrc: Oral Oral Oral Oral  SpO2: 100% 100% 95% 99%  Weight:      Height:       Eyes: PERRL, lids and conjunctivae normal ENMT: Mucous membranes are moist. Posterior pharynx clear of any exudate or lesions.Normal dentition.  Neck: normal, supple, no masses, no thyromegaly Respiratory: clear to auscultation bilaterally, no wheezing, no crackles. Normal respiratory effort. No accessory muscle use.  Cardiovascular: Regular rate and rhythm, no murmurs / rubs / gallops. No extremity edema. 2+ pedal pulses. No carotid bruits.  Abdomen: no tenderness, no masses palpated. No hepatosplenomegaly. Bowel sounds positive.  Musculoskeletal: no clubbing / cyanosis. No joint deformity upper and lower extremities. Good ROM, no contractures. Normal muscle tone.  Skin: no rashes, lesions, ulcers. No induration Neurologic: CN 2-12 grossly intact. Sensation intact, DTR normal. Strength 5/5 in all  4.  Psychiatric: Normal judgment and insight. Alert and oriented x 3. Normal mood.     Labs on Admission: I have personally reviewed following labs and imaging studies  CBC: Recent Labs  Lab 06/05/22 2000 06/06/22 0035 06/06/22 0338 06/06/22 0619 06/06/22 1405 06/06/22 1850 06/07/22 0600 06/08/22 0157  WBC 12.5* 10.5  --  9.3  --  14.8* 15.8* 13.6*  NEUTROABS 9.2*  --   --   --   --   --   --   --  HGB 10.8* 9.8*   < > 8.4* 11.1* 10.0* 9.6* 9.5*  HCT 32.5* 30.5*   < > 24.5* 32.5* 29.7* 27.8* 28.4*  MCV 90.0 92.1  --  85.7  --  83.9 84.2 84.5  PLT 414* 392  --  331  --  316 237 338   < > = values in this interval not displayed.   Basic Metabolic Panel: Recent Labs  Lab 06/03/22 0519 06/05/22 2000 06/06/22 0338 06/06/22 0619 06/07/22 0600 06/08/22 0157  NA 138 137 136 136 131* 136  K 3.6 3.2* 3.6 3.7 3.7 3.8  CL 108 102  --  105 102 105  CO2 22 22  --  22 22 21*  GLUCOSE 98 120*  --  111* 101* 110*  BUN 8 6*  --  6* 11 11  CREATININE 0.73 0.79  --  0.74 0.94 0.85  CALCIUM 8.4* 9.5  --  8.6* 8.1* 8.9   GFR: Estimated Creatinine Clearance: 61.5 mL/min (by C-G formula based on SCr of 0.85 mg/dL). Liver Function Tests: Recent Labs  Lab 06/05/22 2000 06/07/22 0600  AST 19 15  ALT 17 10  ALKPHOS 48 36*  BILITOT 0.6 1.0  PROT 6.4* 5.1*  ALBUMIN 3.4* 2.5*   No results for input(s): "LIPASE", "AMYLASE" in the last 168 hours. No results for input(s): "AMMONIA" in the last 168 hours. Coagulation Profile: Recent Labs  Lab 06/06/22 0619  INR 1.4*   Cardiac Enzymes: No results for input(s): "CKTOTAL", "CKMB", "CKMBINDEX", "TROPONINI" in the last 168 hours. BNP (last 3 results) No results for input(s): "PROBNP" in the last 8760 hours. HbA1C: No results for input(s): "HGBA1C" in the last 72 hours. CBG: No results for input(s): "GLUCAP" in the last 168 hours. Lipid Profile: No results for input(s): "CHOL", "HDL", "LDLCALC", "TRIG", "CHOLHDL", "LDLDIRECT" in  the last 72 hours. Thyroid Function Tests: No results for input(s): "TSH", "T4TOTAL", "FREET4", "T3FREE", "THYROIDAB" in the last 72 hours. Anemia Panel: No results for input(s): "VITAMINB12", "FOLATE", "FERRITIN", "TIBC", "IRON", "RETICCTPCT" in the last 72 hours. Urine analysis:    Component Value Date/Time   COLORURINE YELLOW 06/06/2022 1717   APPEARANCEUR CLEAR 06/06/2022 1717   APPEARANCEUR Clear 12/03/2015 1122   LABSPEC 1.021 06/06/2022 1717   PHURINE 5.0 06/06/2022 1717   GLUCOSEU NEGATIVE 06/06/2022 1717   HGBUR MODERATE (A) 06/06/2022 1717   BILIRUBINUR NEGATIVE 06/06/2022 1717   BILIRUBINUR Negative 09/28/2019 1504   BILIRUBINUR Negative 12/03/2015 1122   KETONESUR NEGATIVE 06/06/2022 1717   PROTEINUR NEGATIVE 06/06/2022 1717   UROBILINOGEN 0.2 09/28/2019 1504   UROBILINOGEN 0.2 05/22/2012 0254   NITRITE NEGATIVE 06/06/2022 1717   LEUKOCYTESUR SMALL (A) 06/06/2022 1717    Radiological Exams on Admission: DG CHEST PORT 1 VIEW  Result Date: 06/06/2022 CLINICAL DATA:  Fever. EXAM: PORTABLE CHEST 1 VIEW COMPARISON:  Chest radiograph dated 02/03/2022. FINDINGS: No focal consolidation, pleural effusion, or pneumothorax. The cardiac silhouette is within normal limits. Atherosclerotic calcification of the aorta. No acute osseous pathology. IMPRESSION: No active disease. Electronically Signed   By: Anner Crete M.D.   On: 06/06/2022 18:38    EKG: Independently reviewed.   Assessment/Plan Principal Problem:   Pseudoaneurysm of left femoral artery (HCC) Active Problems:   Acute blood loss anemia   Fever   Lupus anticoagulant syndrome (HCC)   PAD (peripheral artery disease) ([place])  (please populate well all problems here in Problem List. (For example, if patient is on BP meds at home and you resume or decide to  hold them, it is a problem that needs to be her. Same for CAD, COPD, HLD and so on)  Fever -Likely absorption fever from the left groin hematoma, large in size  of about 300 ml. And more importantly, fever subsided after hematoma patient and normal fever the next day and today.  Blood culture remain negative, urine culture negative for 2 days.  Active infection less likely.  Procalcitonin trending 0.8> 0.79, no spiking up of procalcitonin level which can be explained by left groin hematoma and wound, and not trending up without antibiotics intervention, implying less likely active infection.  WBC also stable last 2 days without antibiotics.  Clinically, no significant symptoms signs of active infection, will continue to hold off antibiotics and monitor.  Agreed with that if patient remain afebrile tomorrow and no significant spiking up of WBC or/and procalcitonin, patient can be discharged home.  Otherwise, reconsult internal medicine for further recommendation.  Left groin hematoma -Stable, no significant H&H drop  CAD, PVD, PE -Stable, as per primary team    Lequita Halt MD [facility] Pager [phone]  06/08/2022, 3:36 PM

## 2022-06-09 ENCOUNTER — Encounter (HOSPITAL_COMMUNITY): Payer: Self-pay | Admitting: Vascular Surgery

## 2022-06-09 ENCOUNTER — Encounter: Payer: Self-pay | Admitting: Cardiovascular Disease

## 2022-06-09 DIAGNOSIS — I724 Aneurysm of artery of lower extremity: Secondary | ICD-10-CM | POA: Diagnosis not present

## 2022-06-09 DIAGNOSIS — I739 Peripheral vascular disease, unspecified: Secondary | ICD-10-CM | POA: Diagnosis not present

## 2022-06-09 LAB — CBC
HCT: 26.1 % — ABNORMAL LOW (ref 36.0–46.0)
Hemoglobin: 8.6 g/dL — ABNORMAL LOW (ref 12.0–15.0)
MCH: 28 pg (ref 26.0–34.0)
MCHC: 33 g/dL (ref 30.0–36.0)
MCV: 85 fL (ref 80.0–100.0)
Platelets: 405 10*3/uL — ABNORMAL HIGH (ref 150–400)
RBC: 3.07 MIL/uL — ABNORMAL LOW (ref 3.87–5.11)
RDW: 15.9 % — ABNORMAL HIGH (ref 11.5–15.5)
WBC: 10 10*3/uL (ref 4.0–10.5)
nRBC: 0 % (ref 0.0–0.2)

## 2022-06-09 LAB — BASIC METABOLIC PANEL
Anion gap: 11 (ref 5–15)
BUN: 13 mg/dL (ref 8–23)
CO2: 21 mmol/L — ABNORMAL LOW (ref 22–32)
Calcium: 8.6 mg/dL — ABNORMAL LOW (ref 8.9–10.3)
Chloride: 104 mmol/L (ref 98–111)
Creatinine, Ser: 0.71 mg/dL (ref 0.44–1.00)
GFR, Estimated: >60 mL/min (ref >60)
Glucose, Bld: 107 mg/dL — ABNORMAL HIGH (ref 70–99)
Potassium: 3.5 mmol/L (ref 3.5–5.1)
Sodium: 136 mmol/L (ref 135–145)

## 2022-06-09 LAB — PROCALCITONIN: Procalcitonin: 0.4 ng/mL

## 2022-06-09 MED ORDER — LISINOPRIL 10 MG PO TABS: 10.0000 mg | ORAL_TABLET | Freq: Every day | ORAL | 1 refills | Status: DC

## 2022-06-09 MED ORDER — FENOFIBRATE 160 MG PO TABS: 160.0000 mg | ORAL_TABLET | Freq: Every day | ORAL | 2 refills | Status: DC

## 2022-06-09 MED ORDER — ASPIRIN 81 MG PO TBEC: 81.0000 mg | DELAYED_RELEASE_TABLET | Freq: Every day | ORAL | 0 refills | Status: DC

## 2022-06-09 NOTE — Plan of Care (Signed)

## 2022-06-09 NOTE — Progress Notes (Signed)
Mobility Specialist - Progress Note   06/09/22 0959  Mobility  Activity Ambulated with assistance in hallway  Level of Assistance Standby assist, set-up cues, supervision of patient - no hands on  Assistive Device None  Distance Ambulated (ft) 470 ft  Activity Response Tolerated well  Mobility Referral Yes  $Mobility charge 1 Mobility   Pt received in bed and agreeable. No complaints throughout. Pt returned to EOB with all needs met.  Larey Seat

## 2022-06-09 NOTE — Discharge Summary (Signed)
Discharge Summary    Patient ID: [name] [name] MRN: [id number]; DOB: 06-15-1956  Admit date: 06/05/2022 Discharge date: 06/09/2022  PCP:  Billie Ruddy, MD   [place] Providers Cardiologist:  Glenetta Hew, MD  Cardiology APP:  Almyra Deforest, [place]       Discharge Diagnoses    Principal Problem:   Pseudoaneurysm of left femoral artery [facility]) Active Problems:   Acute blood loss anemia   Fever   Lupus anticoagulant syndrome (HCC)   PAD (peripheral artery disease) ([place])    Diagnostic Studies/Procedures  06/05/2022 Vas [place] Lower Extremity   +-----------+----------+-----------+------+----------+  Left DuplexPSV (cm/s) Waveform  PlaqueComment(s)  +-----------+----------+-----------+------+----------+  CFA                  multiphasic                  +-----------+----------+-----------+------+----------+  Prox SFA             Multiphasic                  +-----------+----------+-----------+------+----------+   Left Vein comments:     Summary:  Negative for obvious evidence of left groin pseudoaneurysm. Large anechoic  area  suggestive of hematoma is noted in the left groin.     06/06/22 CTA Pelvis  IMPRESSION: Pseudoaneurysm with active bleeding from the left common femoral artery access site into a 6.9 cm left inguinal hematoma. .  06/06/22 Vascular Intervention  Patient underwent left thigh exploration with vascular surgery with evacuation of large hematoma and suture hemostasis of arteriotomy. Wound vac placed (to be kept on x7 days).  _____________   History of Present Illness     [name] [name] is a 66 y.o. female with a hx of HTN, CAD s/p LAD/Lcx PCI, lupus, history of DVT/PE on chronic anticoagulation who is being seen today for the evaluation of left groin hematoma at the request of emergency department. Patient underwent left lower extremity SFA intervention with Dr. Gwenlyn Found on 06/02/2022. Given her history of lupus  anticoagulant, she was continued on aspirin, Plavix, and rivaroxaban.   Patient recovered well in the initial 24 hours following her procedure. However, approximately 24 hours following SFA intervention, patient noted discomfort in her left leg that worsened throughout the day. On the morning of 10/20, patient had severe left groin pain and presented to the ED.   Hospital Course     Consultants: Vascular surgery   Left common femoral artery pseudoaneurysm with active bleeding hematoma Lower extremity PAD s/p L SFA intervention 06/02/2022  In the ED, CTA pelvis notable for a pseudoaneurysm with active bleeding from left common femoral artery access site into a 6.9 left inguinal hematoma. Patient underwent left thigh exploration with vascular surgery with evacuation of large hematoma and suture hemostasis of arteriotomy. Following surgery, VAC dressing placed. Initially, patient was continued on ASA only. Heparin was resumed on 10/20, with plavix added back on 10/21. On 10/22, patient was transitioned to [place]. She has had no recurrence of bleeding and continues to recover well following vascular intervention of bleeding pseudoaneurysm. Plan for triple therapy x 1 month, then drop ASA. Vascular follow up to be scheduled by vascular surgery. Follow up with Dr. Alvester Chou on 07/04/22.  CAD s/p PCI to LAD and Lcx Hypertriglyceridemia Continue ASA, restarted plavix as above. Continue Fenofibrate. Patient reportedly intolerant of statins.   Fever To 103.7 on 10/20. Unclear source.  PCT 0.8.  CXR clear.  COVID negative. Blood cultures  NGTD.  UA with 21-50 WBCs, urine culture negative, no urinary symptoms.  D/w IM, recommend holding off on treatment, suspected fever from hematoma.      Did the patient have an acute coronary syndrome (MI, NSTEMI, STEMI, etc) this admission?:  No                               Did the patient have a percutaneous coronary intervention (stent / angioplasty)?:  No.           _____________  Discharge Vitals Blood pressure 114/89, pulse 92, temperature 98.4 F (36.9 C), temperature source Oral, resp. rate 20, height 5' 4.5" (1.638 m), weight 68 kg, SpO2 99 %.  Filed Weights   06/05/22 1335  Weight: 68 kg   Physical Exam Constitutional:      General: She is not in acute distress.    Appearance: Normal appearance.  HENT:     Head: Normocephalic and atraumatic.  Eyes:     Pupils: Pupils are equal, round, and reactive to light.  Cardiovascular:     Rate and Rhythm: Normal rate and regular rhythm.     Heart sounds: No murmur heard. Pulmonary:     Effort: Pulmonary effort is normal. No respiratory distress.     Breath sounds: Normal breath sounds.  Abdominal:     General: Abdomen is flat. There is no distension.     Palpations: Abdomen is soft.  Musculoskeletal:        General: Normal range of motion.     Cervical back: Normal range of motion.     Right lower leg: No edema.     Left lower leg: No edema.  Skin:    Comments: Dressing w/ wound vac in place left groin. No bleeding. Diffuse ecchymosis (stable).  Neurological:     General: No focal deficit present.     Mental Status: She is alert and oriented to person, place, and time.     Cranial Nerves: No cranial nerve deficit.  Psychiatric:        Mood and Affect: Mood normal.        Behavior: Behavior normal.        Thought Content: Thought content normal.        Judgment: Judgment normal.      Labs & Radiologic Studies    CBC Recent Labs    06/08/22 0157 06/09/22 0203  WBC 13.6* 10.0  HGB 9.5* 8.6*  HCT 28.4* 26.1*  MCV 84.5 85.0  PLT 338 381*   Basic Metabolic Panel Recent Labs    06/08/22 0157 06/09/22 0203  NA 136 136  K 3.8 3.5  CL 105 104  CO2 21* 21*  GLUCOSE 110* 107*  BUN 11 13  CREATININE 0.85 0.71  CALCIUM 8.9 8.6*   Liver Function Tests Recent Labs    06/07/22 0600  AST 15  ALT 10  ALKPHOS 36*  BILITOT 1.0  PROT 5.1*  ALBUMIN 2.5*   No results for  input(s): "LIPASE", "AMYLASE" in the last 72 hours. High Sensitivity Troponin:   No results for input(s): "TROPONINIHS" in the last 720 hours.  BNP Invalid input(s): "POCBNP" D-Dimer No results for input(s): "DDIMER" in the last 72 hours. Hemoglobin A1C No results for input(s): "HGBA1C" in the last 72 hours. Fasting Lipid Panel No results for input(s): "CHOL", "HDL", "LDLCALC", "TRIG", "CHOLHDL", "LDLDIRECT" in the last 72 hours. Thyroid Function Tests No results for input(s): "TSH", "  T4TOTAL", "T3FREE", "THYROIDAB" in the last 72 hours.  Invalid input(s): "FREET3" _____________  DG CHEST PORT 1 VIEW  Result Date: 06/06/2022 CLINICAL DATA:  Fever. EXAM: PORTABLE CHEST 1 VIEW COMPARISON:  Chest radiograph dated 02/03/2022. FINDINGS: No focal consolidation, pleural effusion, or pneumothorax. The cardiac silhouette is within normal limits. Atherosclerotic calcification of the aorta. No acute osseous pathology. IMPRESSION: No active disease. Electronically Signed   By: Anner Crete M.D.   On: 06/06/2022 18:38   VAS [place] GROIN PSEUDOANEURYSM  Result Date: 06/06/2022  ARTERIAL PSEUDOANEURYSM  Patient Name:  [name] [name]  Date of Exam:   06/05/2022 Medical Rec #: [id number]            Accession #:    [id number] Date of Birth: November 22, 1955            Patient Gender: F Patient Age:   66 years Exam Location:  [facility] Procedure:      VAS [place] GROIN PSEUDOANEURYSM Referring Phys: Cherlynn June --------------------------------------------------------------------------------  Exam: Left groin Indications: Patient complains of groin pain and bruising. Limitations: poor ultrasound/tissue interface Comparison Study: No prior studies. Performing Technologist: Oliver Hum RVT  Examination Guidelines: A complete evaluation includes B-mode imaging, spectral Doppler, color Doppler, and power Doppler as needed of all accessible portions of each vessel. Bilateral testing is considered an integral  part of a complete examination. Limited examinations for reoccurring indications may be performed as noted. +-----------+----------+-----------+------+----------+ Left DuplexPSV (cm/s) Waveform  PlaqueComment(s) +-----------+----------+-----------+------+----------+ CFA                  multiphasic                 +-----------+----------+-----------+------+----------+ Prox SFA             Multiphasic                 +-----------+----------+-----------+------+----------+ Left Vein comments:  Summary: Negative for obvious evidence of left groin pseudoaneurysm. Large anechoic area suggestive of hematoma is noted in the left groin. Diagnosing physician: Servando Snare MD Electronically signed by Servando Snare MD on 06/06/2022 at 5:27:41 PM.    --------------------------------------------------------------------------------    Final    CT Angio Pelvis W and/or Wo Contrast  Result Date: 06/06/2022 CLINICAL DATA:  Abdominal pain; postop catheterization through left groin significant bruising and firmness around the belly mons pubis and upper thigh EXAM: CTA PELVIS WITH CONTRAST TECHNIQUE: Multidetector CT imaging of the pelvis was performed using the standard protocol following the bolus administration of intravenous contrast. RADIATION DOSE REDUCTION: This exam was performed according to the departmental dose-optimization program which includes automated exposure control, adjustment of the mA and/or kV according to patient size and/or use of iterative reconstruction technique. CONTRAST:  61m OMNIPAQUE IOHEXOL 350 MG/ML SOLN COMPARISON:  None Available. FINDINGS: Urinary Tract:  No abnormality visualized. Bowel:  Unremarkable visualized pelvic bowel loops. Vascular/Lymphatic: Left common femoral pseudoaneurysm measuring 7 mm (series 5/image 90 with active contrast extravasation into a 6.9 cm hematoma within the left inguinal region (series 5/image 86. Marked edema within the left flank extending into  the midline abdomen and mons pubis. No suspicious lymphadenopathy.  Bilateral common iliac vein stents. Reproductive:  No mass or other significant abnormality Other:  None. Musculoskeletal: Partially visualized lumbar spine fusion. No acute abnormality. IMPRESSION: Pseudoaneurysm with active bleeding from the left common femoral artery access site into a 6.9 cm left inguinal hematoma. These results were called by telephone at the time of interpretation on 06/06/2022 at 1:17 am  to provider RILEY RANSOM , who verbally acknowledged these results. Electronically Signed   By: Placido Sou M.D.   On: 06/06/2022 01:18   PERIPHERAL VASCULAR CATHETERIZATION  Result Date: 06/02/2022 Images from the original result were not included.  [id number] LOCATION:  FACILITY: [facility] PHYSICIAN: Quay Burow, M.D. 10-29-1955 DATE OF PROCEDURE:  06/02/2022 DATE OF DISCHARGE: PV Angiogram/Intervention History obtained from chart review.[name] [name] is a 66 y.o.   mild to moderately overweight married Caucasian female mother of 46, grandmother of 2 grandchildren referred by Almyra Deforest, PA-C for peripheral vascular valuation.  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  I last saw her in the office 03/21/2022.  Her risk factors include ongoing tobacco abuse 1/2 pack/day, treated hypertension and hyperlipidemia.  She does have CAD status post LAD and circumflex intervention back in January 2020.  She is never had a stroke.  She does have lupus anticoagulant with extensive lower extremity DVT and pulmonary emboli status post endovascular therapy with stenting of her lower extremity venous vasculature as well as placement of an IVC filter.  She was on Coumadin for some time and transition to Xarelto.  When she was on triple therapy for her LAD and circumflex stent she had GI bleeding.  Aspirin was discontinued.  She was not tolerant to Plavix.  She does complain of bilateral calf  claudication over the last year with Dopplers performed 02/27/2022 revealing a right ABI of 0.57, left of 0.52.  She had high-grade lesions in her distal right SFA and popliteal artery and an occluded left SFA.  Since I saw her 2 months ago she has become more symptomatic on the right side.  She has dependent rubor, resting foot pain and right calf claudication that prevents her from exercising.  She wishes to proceed with outpatient diagnostic peripheral angiography and potential endovascular therapy.  We will have to be mindful of her clotting issues with her lupus anticoagulant being on Xarelto.  I will have our Pharm.D.'s orchestrate a Lovenox bridge. Pre Procedure Diagnosis: Peripheral arterial disease Post Procedure Diagnosis: Peripheral arterial disease Operators: Dr. Quay Burow Procedures Performed:  1.  Ultrasound-guided left common femoral access  2.  Abdominal aortogram/bilateral iliac angiogram/bifemoral runoff  3.  Contralateral access (second-order catheter placement)  4.  Hawk 1 directional atherectomy/DCB right above-the-knee popliteal artery             5.  Spider distal protection device right below the knee popliteal artery PROCEDURE DESCRIPTION: The patient was brought to the second floor [facility] cath lab in the the postabsorptive state. She was premedicated with IV Versed and fentanyl. Her left groin was prepped and shaved in usual sterile fashion. Xylocaine 1% was used for local anesthesia. A 5 French sheath was inserted into the left common femoral artery using standard Seldinger technique.  Ultrasound was used to identify the left common femoral artery and guide access.  A digital image was captured and placed the patient's chart.  A 5 French pigtail catheter was placed in distal abdominal aorta.  Distal abdominal aortography, bilateral iliac angiography with bifemoral runoff was performed using bolus chase, digital subtraction and step table technique.  Omnipaque dye was used  for the entirety of the case (170 cc contrast total to patient).  Retrograde aortic pressures monitored during the case.  Angiographic Data: 1: Abdominal aorta-mild atherosclerosis 2: Left lower extremity-moderately long segment mid left SFA CTO with three-vessel runoff 3: Right lower extremity-short  CTO right above-the-knee popliteal artery with two-vessel runoff.  The anterior tibial is occluded in its distal portion   Ms. Lahman has CTOs bilaterally, on the right just above the knee in the above-knee popliteal artery and a left mid SFA.  She is more symptomatic on the right.  We will proceed with right above-the-knee popliteal directional atherectomy followed by drug-coated balloon angioplasty using spider distal protection. Procedure Description: Contralateral access was obtained and a 7 French 55 cm multipurpose Ansell sheath was then advanced across the iliac bifurcation into the right common femoral artery (second-order cath placement).  The patient received 24,000's of heparin with an ACT of 299 at the maximum.  She was already on aspirin and clopidogrel.  She had stopped her Xarelto on 05/31/2022 and was placed on a Lovenox bridge. I was able to cross the CTO with a Viance CTO catheter and a 0.14/6 g Shepperd wire.  I then performed PTA of the right above-the-knee popliteal CTO with a 2.5 mm x 40 mm balloon.  Following this I successfully placed a 5 mm spider distal protection device in the right below the knee popliteal artery.  I then performed directional atherectomy with an LS directional atherectomy device removing a significant amount of atherosclerotic plaque.  I then performed DCB using a 4 mm x 60 mm long impact Admiral drug-coated balloon at 4 atm for 3 minutes resulting in reduction of a total occlusion to 0% residual with excellent flow.  The trifurcation remained intact.  There was a very small nonflow limiting flap within the balloon segment. Final Impression: Successful Hawk 1 directional  atherectomy followed by [facility] using spider distal protection of a short segment right above-the-knee popliteal artery CTO for claudication.  Patient does have lupus anticoagulant.  She was difficult to anticoagulate.  We will restart Lovenox bridge tonight and Xarelto tomorrow.  She will need to be on "triple therapy" for 1 month after which we will discontinue the aspirin.  The sheath will be removed once ACT falls below 170 pressure held.  She will be hydrated overnight and discharged home in the morning.  We will obtain lower extremity arterial Doppler studies in unrefined office next week and I will see her back the week after. Quay Burow. MD, [facility] 06/02/2022 2:50 PM    Disposition   Pt was seen by myself and Dr. Debara Pickett and is being discharged home today in good condition.  Follow-up Plans & Appointments     Follow-up Information     Lenna Sciara, NP Follow up on 06/16/2022.   Specialties: Nurse Practitioner, Family Medicine Why: Follow up at 8:25am. Contact information: [address] [place] [address] [phone]         Lorretta Harp, MD Follow up on 07/04/2022.   Specialties: Cardiology, Radiology Why: Follow up at 10:30am. Contact information: [address] [place] [address] [phone]                Discharge Instructions     Call MD for:  persistant dizziness or light-headedness   Complete by: As directed    Call MD for:  redness, tenderness, or signs of infection (pain, swelling, redness, odor or green/yellow discharge around incision site)   Complete by: As directed    Call MD for:  severe uncontrolled pain   Complete by: As directed    Call MD for:  temperature >100.4   Complete by: As directed    Diet - low sodium heart  healthy   Complete by: As directed    Discharge instructions   Complete by: As directed    Please follow vascular surgery wound care instructions.   Discharge wound care:   Complete by: As  directed    Per vascular surgery, patient to remove wound vac dressing on Friday 10/27. Follow up appointment to be scheduled by vascular surgery.   Increase activity slowly   Complete by: As directed         Discharge Medications   Allergies as of 06/09/2022       Reactions   Prednisone Other (See Comments)   No energy "stalls out".    Makes pt  Jittery.   Tramadol Other (See Comments)   Constipation,  Makes pt  Jittery.   Crestor [rosuvastatin Calcium]    Feels fatigued, RE  CHALLENGE - UNABLE TO USE 12/28/18   Hctz [hydrochlorothiazide]    Dizzy   Repatha [evolocumab] Other (See Comments)   Per patient never took this medication        Medication List     STOP taking these medications    amLODipine 5 MG tablet Commonly known as: NORVASC       TAKE these medications    acetaminophen 500 MG tablet Commonly known as: TYLENOL Take 500 mg by mouth daily as needed for mild pain or moderate pain.   acetaminophen 650 MG CR tablet Commonly known as: TYLENOL Take 650 mg by mouth daily as needed for pain.   aspirin EC 81 MG tablet Take 1 tablet (81 mg total) by mouth daily. Swallow whole. Plan to stop taking after your follow up with Dr. Alvester Chou on 07/04/22. What changed: additional instructions   bisacodyl 5 MG EC tablet Commonly known as: DULCOLAX Take 5 mg by mouth daily as needed for moderate constipation.   carvedilol 6.25 MG tablet Commonly known as: COREG Take 0.5 tablets (3.125 mg total) by mouth 2 (two) times daily with a meal.   clopidogrel 75 MG tablet Commonly known as: PLAVIX Take 1 tablet (75 mg total) by mouth daily.   fenofibrate 160 MG tablet Take 1 tablet (160 mg total) by mouth daily. Start taking on: June 10, 2022 What changed:  medication strength how much to take additional instructions   lisinopril 10 MG tablet Commonly known as: ZESTRIL Take 1 tablet (10 mg total) by mouth daily. Start taking on: June 10, 2022 What  changed:  medication strength how much to take   nitroGLYCERIN 0.4 MG SL tablet Commonly known as: NITROSTAT Place 1 tablet (0.4 mg total) under the tongue every 5 (five) minutes as needed for up to 3 doses for chest pain.   rivaroxaban 20 MG Tabs tablet Commonly known as: Xarelto Take 1 tablet (20 mg total) by mouth daily with supper.               Discharge Care Instructions  (From admission, onward)           Start     Ordered   06/09/22 0000  Discharge wound care:       Comments: Per vascular surgery, patient to remove wound vac dressing on Friday 10/27. Follow up appointment to be scheduled by vascular surgery.   06/09/22 1211               Outstanding Labs/Studies     Duration of Discharge Encounter   Greater than 30 minutes including physician time.  Delton Coombes, PA-C 06/09/2022, 12:12 PM

## 2022-06-09 NOTE — Progress Notes (Addendum)
Progress Note    06/09/2022 8:05 AM 3 Days Post-Op  Subjective:  wants to go home   Vitals:   06/08/22 1920 06/09/22 0508  BP: (!) 118/47 130/68  Pulse: 89 97  Resp: 16 20  Temp:  98.3 F (36.8 C)  SpO2:  99%   Physical Exam: Lungs:  non labored Incisions:  L groin with prevena vac, good seal; palpable hematoma but soft Extremities:  feet warm and well perfused Neurologic: A&O  CBC    Component Value Date/Time   WBC 10.0 06/09/2022 0203   RBC 3.07 (L) 06/09/2022 0203   HGB 8.6 (L) 06/09/2022 0203   HGB 13.8 05/23/2022 1004   HGB 12.9 09/22/2013 1213   HCT 26.1 (L) 06/09/2022 0203   HCT 40.4 05/23/2022 1004   HCT 38.4 09/22/2013 1213   PLT 405 (H) 06/09/2022 0203   PLT 496 (H) 05/23/2022 1004   MCV 85.0 06/09/2022 0203   MCV 87 05/23/2022 1004   MCV 87.7 09/22/2013 1213   MCH 28.0 06/09/2022 0203   MCHC 33.0 06/09/2022 0203   RDW 15.9 (H) 06/09/2022 0203   RDW 12.2 05/23/2022 1004   RDW 13.2 09/22/2013 1213   LYMPHSABS 2.1 06/05/2022 2000   LYMPHSABS 2.6 11/15/2021 1133   LYMPHSABS 2.7 09/22/2013 1213   MONOABS 1.0 06/05/2022 2000   MONOABS 0.5 09/22/2013 1213   EOSABS 0.2 06/05/2022 2000   EOSABS 0.2 11/15/2021 1133   BASOSABS 0.0 06/05/2022 2000   BASOSABS 0.0 11/15/2021 1133   BASOSABS 0.1 09/22/2013 1213    BMET    Component Value Date/Time   NA 136 06/09/2022 0203   NA 142 05/23/2022 1004   NA 142 09/22/2013 1213   K 3.5 06/09/2022 0203   K 3.7 09/22/2013 1213   CL 104 06/09/2022 0203   CL 105 09/16/2012 0821   CO2 21 (L) 06/09/2022 0203   CO2 24 09/22/2013 1213   GLUCOSE 107 (H) 06/09/2022 0203   GLUCOSE 93 09/22/2013 1213   GLUCOSE 82 09/16/2012 0821   BUN 13 06/09/2022 0203   BUN 6 (L) 05/23/2022 1004   BUN 7.6 09/22/2013 1213   CREATININE 0.71 06/09/2022 0203   CREATININE 0.7 09/22/2013 1213   CALCIUM 8.6 (L) 06/09/2022 0203   CALCIUM 10.1 09/22/2013 1213   GFRNONAA >60 06/09/2022 0203   GFRAA 98 06/14/2020 0955    INR     Component Value Date/Time   INR 1.4 (H) 06/06/2022 0619     Intake/Output Summary (Last 24 hours) at 06/09/2022 0805 Last data filed at 06/09/2022 0720 Gross per 24 hour  Intake 360 ml  Output --  Net 360 ml     Assessment/Plan:  66 y.o. female is s/p L CFA pseudoaneurysm repair 3 Days Post-Op   H&H stable LLE warm and well perfused L groin hematoma soft.  Prevena vac with good seal.  Ok to switch from hospital pump to portable prevena pump for discharge home.  Discussed removing prevena dressing on Friday at home with patient and her husband and they demonstrate good understanding.  We will then remove staples in office in about 2 weeks.  Ok for discharge from vascular standpoint   Dagoberto Ligas, PA-C [facility] [phone] 06/09/2022 8:05 AM  VASCULAR STAFF ADDENDUM: I have independently interviewed and examined the patient. I agree with the above.  Pt progressing appropriately. Home today. Follow up scheduled.  Will continue to see Dr. Gwenlyn Found for PAD. I will ensure left groin wound healing.  Cassandria Santee, MD [facility] Phone Number: [phone] 06/09/2022 10:03 AM

## 2022-06-10 ENCOUNTER — Telehealth: Payer: Self-pay | Admitting: Licensed Clinical Social Worker

## 2022-06-11 ENCOUNTER — Telehealth: Payer: Self-pay | Admitting: Vascular Surgery

## 2022-06-11 LAB — CULTURE, BLOOD (ROUTINE X 2)
Culture: NO GROWTH
Culture: NO GROWTH
Special Requests: ADEQUATE
Special Requests: ADEQUATE

## 2022-06-11 NOTE — Telephone Encounter (Signed)
-----   Message from Waynetta Sandy, MD sent at 06/07/2022  9:53 AM EDT ----- [name] [name] [id number] 06-Oct-1955  F/u in 2-3 weeks with Virl Cagey or PA on a Friday when he is in office.   Erlene Quan

## 2022-06-12 ENCOUNTER — Other Ambulatory Visit (HOSPITAL_COMMUNITY): Payer: Self-pay | Admitting: Cardiovascular Disease

## 2022-06-12 ENCOUNTER — Ambulatory Visit (HOSPITAL_COMMUNITY)
Admission: RE | Admit: 2022-06-12 | Discharge: 2022-06-12 | Disposition: A | Discharge status: Home / Self Care | Payer: Medicare Other | Source: Ambulatory Visit | Attending: Cardiovascular Disease | Admitting: Cardiovascular Disease

## 2022-06-12 DIAGNOSIS — Z9862 Peripheral vascular angioplasty status: Secondary | ICD-10-CM

## 2022-06-12 DIAGNOSIS — I739 Peripheral vascular disease, unspecified: Secondary | ICD-10-CM

## 2022-06-16 ENCOUNTER — Ambulatory Visit: Payer: Medicare Other | Admitting: Nurse Practitioner

## 2022-06-16 NOTE — Patient Outreach (Signed)
  Care Coordination   06/16/2022 Name: [name] [name] MRN: [id number] DOB: 1955/11/04   Care Coordination Outreach Attempts:  An unsuccessful telephone outreach was attempted today to offer the patient information about available care coordination services as a benefit of their health plan.   Follow Up Plan:  Additional outreach attempts will be made to offer the patient care coordination information and services.   Encounter Outcome:  No Answer  Care Coordination Interventions Activated:  No   Care Coordination Interventions:  No, not indicated    Christa See, MSW, [email or web address] Phone [phone] 4:15 PM

## 2022-06-16 NOTE — Progress Notes (Deleted)
Office Visit    Patient Name: [name] [name] Date of Encounter: 06/16/2022  Primary Care Provider:  Billie Ruddy, MD Primary Cardiologist:  Glenetta Hew, MD  Chief Complaint    66 year old female with a history of CAD, PAD, palpitations, atrial tachycardia, PVCs, hypertension, hyperlipidemia, CVA, DVT/PE, and lupus anticoagulant syndrome who presents for follow-up related to CAD and hypertension.   Past Medical History    Past Medical History:  Diagnosis Date   Back pain    CAD S/P percutaneous coronary angioplasty 09/13/2018   09/13/2018 - Cath & Staged DES PCI on 09/14/2018: DES PCI: Cx99%&55% - Resolute Onyx DES 3.5 x 30 (3.6 mm), p-mLAD (prox of D1 almost to D2) - Resolute Onyx DES 3.0 x 22 (3.3 mm); DFR on pRCA 70% - 0.99, Not significant -> Rec Med Rx.   DVT (deep venous thrombosis) ([place])    Heart attack ([place]) 09/09/2018   Hypertension    Lupus anticoagulant syndrome (HCC)    NSTEMI (non-ST elevated myocardial infarction) ([place]) 09/12/2018   Initial presentation with chest pain was on 09/09/2018, recurrent pain on 1/26 2020 with positive troponins.  Cath showed three-vessel CAD -> declined CABG, opted for two-vessel DES PCI (LAD and CX, negative DFR RCA)   Pulmonary embolism ([place])    Past Surgical History:  Procedure Laterality Date   ABDOMINAL AORTOGRAM W/LOWER EXTREMITY N/A 06/02/2022   Procedure: ABDOMINAL AORTOGRAM W/LOWER EXTREMITY;  Surgeon: Lorretta Harp, MD;  Location: [place] [facility];  Service: Cardiovascular;  Laterality: N/A;   APPLICATION OF WOUND VAC Left 06/06/2022   Procedure: APPLICATION OF WOUND VAC LEFT GROIN;  Surgeon: Broadus John, MD;  Location: [place];  Service: Vascular;  Laterality: Left;   ARTERY REPAIR Left 06/06/2022   Procedure: LEFT COMMON FEMORAL ARTERY REPAIR WITH MORCELLS;  Surgeon: Broadus John, MD;  Location: [place];  Service: Vascular;  Laterality: Left;   BACK SURGERY     COLONOSCOPY  05/19/2012   Procedure:  COLONOSCOPY;  Surgeon: Beryle Beams, MD;  Location: [place];  Service: Endoscopy;  Laterality: N/A;   COLONOSCOPY WITH PROPOFOL N/A 09/30/2018   Procedure: COLONOSCOPY WITH PROPOFOL;  Surgeon: Yetta Flock, MD;  Location: [place];  Service: Gastroenterology;  Laterality: N/A;   CORONARY STENT INTERVENTION N/A 09/14/2018   Procedure: CORONARY STENT INTERVENTION;  Surgeon: Martinique, Peter M, MD;  Location: [place] [facility];  Service: Cardiovascular: September 14, 2018- DES PCI: Cx99%&55% - Resolute Onyx DES 3.5 x 30 (3.6 mm), p-mLAD (prox of D1 almost to D2) - Resolute Onyx DES 3.0 x 22 (3.3 mm); DFR on pRCA 70% - 0.99, Not significant -> Rec Med Rx.   ESOPHAGOGASTRODUODENOSCOPY  05/19/2012   Procedure: ESOPHAGOGASTRODUODENOSCOPY (EGD);  Surgeon: Beryle Beams, MD;  Location: [facility];  Service: Endoscopy;  Laterality: N/A;   ESOPHAGOGASTRODUODENOSCOPY (EGD) WITH PROPOFOL N/A 09/30/2018   Procedure: ESOPHAGOGASTRODUODENOSCOPY (EGD) WITH PROPOFOL;  Surgeon: Yetta Flock, MD;  Location: [place];  Service: Gastroenterology;  Laterality: N/A;   GIVENS CAPSULE STUDY N/A 09/30/2018   Procedure: GIVENS CAPSULE STUDY;  Surgeon: Yetta Flock, MD;  Location: [place];  Service: Gastroenterology;  Laterality: N/A;   HEMATOMA EVACUATION Left 06/06/2022   Procedure: EVACUATION HEMATOMA LEFT GROIN;  Surgeon: Broadus John, MD;  Location: [place];  Service: Vascular;  Laterality: Left;   HOT HEMOSTASIS N/A 09/30/2018   Procedure: HOT HEMOSTASIS (ARGON PLASMA COAGULATION/BICAP);  Surgeon: Yetta Flock, MD;  Location: [place];  Service:  Gastroenterology;  Laterality: N/A;   INTRAVASCULAR PRESSURE WIRE/FFR STUDY N/A 09/14/2018   Procedure: INTRAVASCULAR PRESSURE WIRE/FFR STUDY;  Surgeon: Martinique, Peter M, MD;  Location: [place] [facility];  Service: Cardiovascular;  Laterality: RCA: DFR on pRCA 70% - 0.99, Not significant -> Rec Med Rx.   LEFT HEART CATH AND  CORONARY ANGIOGRAPHY N/A 09/13/2018   Procedure: LEFT HEART CATH AND CORONARY ANGIOGRAPHY;  Surgeon: Leonie Man, MD;  Location: [place] [facility];  Service: Cardiovascular:: CULPRIT LESION 99% p-mCx followed by 55%mCx; pLAD 35% A D41fllowed by long 80% lesion @ SP1); pRCA 70%. Normal LVEDP. Global HK EF ~45%.  -Decision on PCI deferred until discussion about PCI versus CABG.  Concern because of long-term DOAC.   PERIPHERAL VASCULAR ATHERECTOMY  06/02/2022   Procedure: PERIPHERAL VASCULAR ATHERECTOMY;  Surgeon: BLorretta Harp MD;  [place];  Service: Cardiovascular;;   PERIPHERAL VASCULAR BALLOON ANGIOPLASTY  06/02/2022   Procedure: PERIPHERAL VASCULAR BALLOON ANGIOPLASTY;  Surgeon: BLorretta Harp MD;  [facility];  Service: Cardiovascular;;   TRANSTHORACIC ECHOCARDIOGRAM  09/13/2018   Mildly reduced EF 45 to 50% with diffuse HK.  GR 1 DD.  Mild aortic valve calcification.  MAC.  Moderate pulmonic regurgitation.   WOUND EXPLORATION Left 06/06/2022   Procedure: LEFT GROIN EXPLORATION;  Surgeon: RBroadus John MD;  [place]  Service: Vascular;  Laterality: Left;    Allergies  Allergies  Allergen Reactions   Prednisone Other (See Comments)    No energy "stalls out".    Makes pt  Jittery.   Tramadol Other (See Comments)    Constipation,  Makes pt  Jittery.   Crestor [Rosuvastatin Calcium]     Feels fatigued, RE  CHALLENGE - UNABLE TO USE 12/28/18   Hctz [Hydrochlorothiazide]     Dizzy    Repatha [Evolocumab] Other (See Comments)    Per patient never took this medication    History of Present Illness    66year old female with the above past medical history including CAD, PAD, palpitations, atrial tachycardia, PVCs, hypertension, hyperlipidemia, CVA, DVT/PE, and lupus anticoagulant syndrome.   Cardiac catheterization in 08/2018 revealed 90% mid left circumflex lesion, 80% proximal LAD lesion, 70% proximal RCA lesion.  Her case was  discussed with CT surgery and ultimately she underwent DES-LAD, DES-p-mLCx.  She is intolerant to Crestor and Repatha.  Beta-blocker was previously decreased in the setting of bradycardia and fatigue.  She has a history of recurrent PE/DVT in the setting of lupus anticoagulant syndrome on chronic Xarelto.  She was evaluated in the ED in 01/2022 in the setting of chest pain.  Troponin was negative x2.  Chest x-ray was normal.   She was last seen in the office on 02/13/2022 and reported intermittent palpitations, claudication.  14-day Zio patch revealed sinus rhythm, asymptomatic atrial runs, and symptomatic PVCs.  Lower extremity arterial Dopplers revealed ABIs of 0.57 on the right, left of 0.52, high-grade lesions in the distal right SFA and popliteal artery, occluded left SFA.  She was referred to Dr. BGwenlyn Foundwho recommended ongoing medical therapy given lack of critical limb ischemia, and extensive thrombotic history as well as previous intolerance to clopidogrel.  She was last seen in the office on 05/23/2022 by Dr. BGwenlyn Foundand agreed to proceed with lower extremity angiography and potential endovascular therapy.  She underwent successful [facility] 1 [facility] a short segment right above-the-knee popliteal artery for CTO  claudication on 06/02/2022.  She was discharged home in stable condition the following day span, Plavix, and Xarelto.  She presented to the ED on 06/05/2022 with leg discomfort.  CT scan revealed active bleeding from a left common femoral artery pseudoaneurysm with large hematoma.  Vascular surgery was consulted and she underwent left thigh exploration with evacuation of large hematoma and suture and hemostasis of arteriotomy, wound VAC placement on 06/06/2022.  Therapy with aspirin, Plavix, and Xarelto was recommended times a month followed by discontinuation of aspirin.  She was noted to have a fever during her hospitalization.  Checks x-ray  was unremarkable, blood cultures were negative, UA did show some WBCs, culture was negative.  Treatment was deferred.  Discharged home in stable condition on 06/09/2022.   She presents today for follow-up. Since her hospitalization   1. CAD: S/p DES-LAD, p-mLCx in 2020. Stable with no anginal symptoms. No indication for ischemic evaluation.  Continue amlodipine, carvedilol, lisinopril, and fenofibrate.   2. Palpitations:  14-day Zio patch revealed sinus rhythm, asymptomatic atrial runs, and symptomatic PVCs.  Denies any recent palpitations, continue carvedilol.   3. PAD: Lower extremity dopplers revealed ABIs of 0.57 on the right, left of 0.52, high-grade lesions in the distal right SFA and popliteal artery, occluded left SFA.  She was referred to Dr. Gwenlyn Found who recommended ongoing medical therapy even lack of critical limb ischemia, and extensive thrombotic history as well as previous intolerance to clopidogrel.  Now s/p successful [facility] 1 directional [facility] spider distal protection of a short segment right above-the-knee popliteal artery for CTO claudication on 06/02/2022.   4.  Pseudoaneurysm/L groin hematoma: S/p left thigh exploration with evacuation of large hematoma and suture and hemostasis of arteriotomy, wound VAC placement on 06/06/2022. Following with vascular surgery.   5. Hypertension: BP well controlled. Continue current antihypertensive regimen.    6. Hyperlipidemia: LDL was 170 in 04/2021.  She has seen Dr. Debara Pickett in the past, she has declined research trials, intolerant of statins, PCSK9 inhibitor.  Discussed risks of elevated cholesterol given history of CAD, PAD.  She is not interested in pursuing any further treatment at this time. Continue fenofibrate.   7. H/o DVT/PE: Continue Xarelto.   8. H/o CVA: No recurrence. On ASA given chronic Xarelto.  Continue fenofibrate.   9. Lupus anticoagulant syndrome: Continue Xarelto.   10. Tobacco use: She  continues to smoke.  Full cessation advised.   11. Disposition: Follow-up in Home Medications    Current Outpatient Medications  Medication Sig Dispense Refill   acetaminophen (TYLENOL) 500 MG tablet Take 500 mg by mouth daily as needed for mild pain or moderate pain.     acetaminophen (TYLENOL) 650 MG CR tablet Take 650 mg by mouth daily as needed for pain.     aspirin EC 81 MG tablet Take 1 tablet (81 mg total) by mouth daily. Swallow whole. Plan to stop taking after your follow up with Dr. Alvester Chou on 07/04/22. 30 tablet 0   bisacodyl (DULCOLAX) 5 MG EC tablet Take 5 mg by mouth daily as needed for moderate constipation.     carvedilol (COREG) 6.25 MG tablet Take 0.5 tablets (3.125 mg total) by mouth 2 (two) times daily with a meal. 90 tablet 3   clopidogrel (PLAVIX) 75 MG tablet Take 1 tablet (75 mg total) by mouth daily. 90 tablet 3   fenofibrate 160 MG tablet Take 1 tablet (160 mg total) by mouth daily. 30 tablet 2   lisinopril (  ZESTRIL) 10 MG tablet Take 1 tablet (10 mg total) by mouth daily. 30 tablet 1   nitroGLYCERIN (NITROSTAT) 0.4 MG SL tablet Place 1 tablet (0.4 mg total) under the tongue every 5 (five) minutes as needed for up to 3 doses for chest pain. 25 tablet 5   rivaroxaban (XARELTO) 20 MG TABS tablet Take 1 tablet (20 mg total) by mouth daily with supper. 42 tablet 0   No current facility-administered medications for this visit.     Review of Systems    ***.  All other systems reviewed and are otherwise negative except as noted above.    Physical Exam    VS:  There were no vitals taken for this visit. , BMI There is no height or weight on file to calculate BMI.     GEN: Well nourished, well developed, in no acute distress. HEENT: normal. Neck: Supple, no JVD, carotid bruits, or masses. Cardiac: RRR, no murmurs, rubs, or gallops. No clubbing, cyanosis, edema.  Radials/DP/PT 2+ and equal bilaterally.  Respiratory:  Respirations regular and unlabored, clear to  auscultation bilaterally. GI: Soft, nontender, nondistended, BS + x 4. MS: no deformity or atrophy. Skin: warm and dry, no rash. Neuro:  Strength and sensation are intact. Psych: Normal affect.  Accessory Clinical Findings    ECG personally reviewed by me today - *** - no acute changes.   Lab Results  Component Value Date   WBC 10.0 06/09/2022   HGB 8.6 (L) 06/09/2022   HCT 26.1 (L) 06/09/2022   MCV 85.0 06/09/2022   PLT 405 (H) 06/09/2022   Lab Results  Component Value Date   CREATININE 0.71 06/09/2022   BUN 13 06/09/2022   NA 136 06/09/2022   K 3.5 06/09/2022   CL 104 06/09/2022   CO2 21 (L) 06/09/2022   Lab Results  Component Value Date   ALT 10 06/07/2022   AST 15 06/07/2022   ALKPHOS 36 (L) 06/07/2022   BILITOT 1.0 06/07/2022   Lab Results  Component Value Date   CHOL 207 (H) 06/03/2022   HDL 26 (L) 06/03/2022   LDLCALC 116 (H) 06/03/2022   LDLDIRECT 141.0 11/25/2018   TRIG 327 (H) 06/03/2022   CHOLHDL 8.0 06/03/2022    No results found for: "HGBA1C"  Assessment & Plan    1.  ***  No BP recorded.  {Refresh Note OR Click here to enter BP  :1}***   Lenna Sciara, NP 06/16/2022, 4:35 AM

## 2022-06-17 ENCOUNTER — Ambulatory Visit: Payer: Medicare Other | Admitting: Physician Assistant

## 2022-06-17 ENCOUNTER — Telehealth: Payer: Self-pay

## 2022-06-17 NOTE — Telephone Encounter (Signed)
Pt called stating that she had some slight oozing from her incision and intermittent pinching at her staples/stitches. She wanted to know if this was normal. She has an appt on 11/10 for removal.  Reviewed pt's chart, returned call for clarification, no answer, no voicemail setup.

## 2022-06-23 NOTE — Progress Notes (Signed)
Cardiology Office Note:    Date:  06/27/2022   ID:  [name] [name], DOB [birth year], MRN [id number]  PCP:  Billie Ruddy, MD  Cardiologist:  Glenetta Hew, MD  Electrophysiologist:  None   Referring MD: Billie Ruddy, MD   Chief Complaint: hospital follow-up of femoral pseudoaneurysm   History of Present Illness:    [name] [name] is a 66 y.o. female with a history of CAD s/p DES to LAD and LCX in 08/2018, PAD with right above-the-knee popliteal artery CTO and left SFA CTO s/p popliteal directional atherectomy followed by drug-coated balloon angioplasty of the right CTO on 84/66/5993 complicated by left common femoral artery pseudoaneurysm requiring evacuation of large hematoma and suture hemostasis of arteriotomy as well as wound VAC,  hypertension, hyperlipidemia intolerant to statins, acute blood loss anemia, lupus anticoagulant syndrome with history of extensive lower extremity DVT and PE s/p endovascular therapy with stenting of lower extremity venous vasculature and placement of IVC filter on chronic anticoagulation with Xarelto, CVA, chronic back pain, and tobacco abuse who is followed by Dr. Ellyn Hack and Dr. Gwenlyn Found and presents today for hospital follow-up of femoral pseudoaneurysm.   Patient was admitted in 08/2018 with NSTEMI. Left cardiac catheterization at that time showed severe 3 vessel disease with 99% stenosis of proximal to mid LCX (culprit lesion) and 80% stenosis of the proximal LAD both of which were successful treated with DES. Echo at that time showed LVEF of 45-50% with diffuse hypokinesis and grade 1 diastolic dysfunction. Lower extremity arterial ultrasounds and ABIs were ordered in 02/2022 for further evaluation of claudication and significant disease bilaterally with CTO of the left SFA as well as 75-99% stenosis of the right popliteal artery and 50-74% stenosis of the right SFA. Left ABI was 0.52 and right ABI was 0.57.  She was referred to Dr. Gwenlyn Found and  underwent peripheral angiography on 06/02/2022 which showed CTO of mid left SFA and short CTO of right above knee popliteal artery. Given she was more symptomatic on the right, decision was made to proceed with right above the knee popliteal direction atherectomy followed by drug-coated balloon angioplasty. She was admitted overnight for observation and had no early complications. Plan was for triple therapy with Aspirin, Plavix, and Xarelto for 1 month at which time Aspirin can be discontinued. Unfortunately, she presented back to the ED on 06/05/2022 for further evaluation of severe left groin pain and hematoma. Pelvic CTA showed a pseudoaneurysm with active bleeding from the left common femoral artery access site into a 6.9 cm left inguinal hematoma. She underwent left thigh exploration with Vascular Surgery with evacuation of the large hematoma and suture hemostasis of arteriotomy and placed of wound VAC on 06/06/2022. Hemoglobin dropped to 7.5 and she required 2 units of PRBCs on day of surgery. Hemoglobin stabilized and she was gradually restarted on Aspirin, Plavix, and Xarelto with no recurrent bleeding. She was able to be discharged on 06/09/2022 on triple therapy.  Of note, patient was noted to have a fever of 103.7 on arrival to the ED with no clear infectious source. Chest x-ray, UA, and blood cultures were all negative for infection. It was felt that fever was likely just secondary to hematoma and she was not treated with antibiotics. Her BP did drop some during admission so her antihypertensives were adjusted (Amlodipine was stopped and Lisinopril was decreased).  Repeat lower extremity arterial ultrasound on 06/12/2022 showed moderate improvement on the right with 1-49% stenosis of the above-the-knee popliteal  artery. Right ABI had normalized to 0.95.   Patient presents today for follow-up. Here alone. She is doing well since recent hospitalization. She is recovering well. She saw Dr. Virl Cagey with  Vascular Surgery earlier today and had staples removed. Incision of left arm is healing well. She has some numbness over incision and in left leg but Dr. Virl Cagey expects will likely improved (but may be permanent). Dopplers reportedly showed normal posterior tibial and distal pedal pulses. She denies any claudication but has not been walking as fast as before. She states that she has no interest in having any other surgeries for her PAD in the future. From a cardiac standpoint she is doing well with no  chest pain, shortness of breath, orthopnea, PND, lower extremity edema, palpitations, lightheadedness, dizziness, or syncope. She has quit smoking since recent admission which I congratulated her on. She states she did stop taking Aspirin a couple of days and has no plans to restart this as she states it the triple therapy thins her blood too much and she is worried she will "spring a leak" and start bleeding somewhere. She is still taking her Plavix and Xarelto but states she was under the impression she could stop Plavix after one month from the intervention.  Of note, she called our office a couple of days ago on 06/24/2022 and reported that her BP was markedly elevated as high as the 180s/90s and request to go back on her usual BP medications (Amlodipine 2.64m daily and Lisinopril 425mdaily). She was given the OK to restart these and BP has significantly improved. BP 138/76 today in the office.  Past Medical History:  Diagnosis Date   Back pain    CAD S/P percutaneous coronary angioplasty 09/13/2018   09/13/2018 - Cath & Staged DES PCI on 09/14/2018: DES PCI: Cx99%&55% - Resolute Onyx DES 3.5 x 30 (3.6 mm), p-mLAD (prox of D1 almost to D2) - Resolute Onyx DES 3.0 x 22 (3.3 mm); DFR on pRCA 70% - 0.99, Not significant -> Rec Med Rx.   DVT (deep venous thrombosis) ([facility]   Heart attack ([facility]/23/2020   Hypertension    Lupus anticoagulant syndrome (HCC)    NSTEMI (non-ST elevated myocardial infarction)  ([facility]/26/2020   Initial presentation with chest pain was on 09/09/2018, recurrent pain on 1/26 2020 with positive troponins.  Cath showed three-vessel CAD -> declined CABG, opted for two-vessel DES PCI (LAD and CX, negative DFR RCA)   Pulmonary embolism ([facility]    Past Surgical History:  Procedure Laterality Date   ABDOMINAL AORTOGRAM W/LOWER EXTREMITY N/A 06/02/2022   Procedure: ABDOMINAL AORTOGRAM W/LOWER EXTREMITY;  Surgeon: BeLorretta HarpMD;  [facility];  Service: Cardiovascular;  Laterality: N/A;   APPLICATION OF WOUND VAC Left 06/06/2022   Procedure: APPLICATION OF WOUND VAC LEFT GROIN;  Surgeon: RoBroadus JohnMD;  [facility] Service: Vascular;  Laterality: Left;   ARTERY REPAIR Left 06/06/2022   Procedure: LEFT COMMON FEMORAL ARTERY REPAIR WITH MORCELLS;  Surgeon: RoBroadus JohnMD;  [facility] Service: Vascular;  Laterality: Left;   BACK SURGERY     COLONOSCOPY  05/19/2012   Procedure: COLONOSCOPY;  Surgeon: PaBeryle BeamsMD;  [facility] Service: Endoscopy;  Laterality: N/A;   COLONOSCOPY WITH PROPOFOL N/A 09/30/2018   Procedure: COLONOSCOPY WITH PROPOFOL;  Surgeon: ArYetta FlockMD;  [place] [facility] Service: Gastroenterology;  Laterality: N/A;   CORONARY STENT  INTERVENTION N/A 09/14/2018   Procedure: CORONARY STENT INTERVENTION;  Surgeon: Martinique, Peter M, MD;  Location: [place] CV LAB;  Service: Cardiovascular: September 14, 2018- DES PCI: Cx99%&55% - Resolute Onyx DES 3.5 x 30 (3.6 mm), p-mLAD (prox of D1 almost to D2) - Resolute Onyx DES 3.0 x 22 (3.3 mm); DFR on pRCA 70% - 0.99, Not significant -> Rec Med Rx.   ESOPHAGOGASTRODUODENOSCOPY  05/19/2012   Procedure: ESOPHAGOGASTRODUODENOSCOPY (EGD);  Surgeon: Beryle Beams, MD;  Location: [facility] ENDOSCOPY;  Service: Endoscopy;  Laterality: N/A;   ESOPHAGOGASTRODUODENOSCOPY (EGD) WITH PROPOFOL N/A 09/30/2018   Procedure: ESOPHAGOGASTRODUODENOSCOPY (EGD) WITH  PROPOFOL;  Surgeon: Yetta Flock, MD;  Location: [place];  Service: Gastroenterology;  Laterality: N/A;   GIVENS CAPSULE STUDY N/A 09/30/2018   Procedure: GIVENS CAPSULE STUDY;  Surgeon: Yetta Flock, MD;  Location: [place];  Service: Gastroenterology;  Laterality: N/A;   HEMATOMA EVACUATION Left 06/06/2022   Procedure: EVACUATION HEMATOMA LEFT GROIN;  Surgeon: Broadus John, MD;  Location: [place];  Service: Vascular;  Laterality: Left;   HOT HEMOSTASIS N/A 09/30/2018   Procedure: HOT HEMOSTASIS (ARGON PLASMA COAGULATION/BICAP);  Surgeon: Yetta Flock, MD;  Location: [facility] ENDOSCOPY;  Service: Gastroenterology;  Laterality: N/A;   INTRAVASCULAR PRESSURE WIRE/FFR STUDY N/A 09/14/2018   Procedure: INTRAVASCULAR PRESSURE WIRE/FFR STUDY;  Surgeon: Martinique, Peter M, MD;  Location: [place] CV LAB;  Service: Cardiovascular;  Laterality: RCA: DFR on pRCA 70% - 0.99, Not significant -> Rec Med Rx.   LEFT HEART CATH AND CORONARY ANGIOGRAPHY N/A 09/13/2018   Procedure: LEFT HEART CATH AND CORONARY ANGIOGRAPHY;  Surgeon: Leonie Man, MD;  Location: [place] CV LAB;  Service: Cardiovascular:: CULPRIT LESION 99% p-mCx followed by 55%mCx; pLAD 35% A D63fllowed by long 80% lesion @ SP1); pRCA 70%. Normal LVEDP. Global HK EF ~45%.  -Decision on PCI deferred until discussion about PCI versus CABG.  Concern because of long-term DOAC.   PERIPHERAL VASCULAR ATHERECTOMY  06/02/2022   Procedure: PERIPHERAL VASCULAR ATHERECTOMY;  Surgeon: BLorretta Harp MD;  [facility];  Service: Cardiovascular;;   PERIPHERAL VASCULAR BALLOON ANGIOPLASTY  06/02/2022   Procedure: PERIPHERAL VASCULAR BALLOON ANGIOPLASTY;  Surgeon: BLorretta Harp MD;  [facility];  Service: Cardiovascular;;   TRANSTHORACIC ECHOCARDIOGRAM  09/13/2018   Mildly reduced EF 45 to 50% with diffuse HK.  GR 1 DD.  Mild aortic valve calcification.  MAC.  Moderate pulmonic regurgitation.    WOUND EXPLORATION Left 06/06/2022   Procedure: LEFT GROIN EXPLORATION;  Surgeon: RBroadus John MD;  [facility];  Service: Vascular;  Laterality: Left;    Current Medications: No outpatient medications have been marked as taking for the 06/27/22 encounter (Office Visit) with GDarreld Mclean PA-C.     Allergies:   Prednisone, Tramadol, Crestor [rosuvastatin calcium], Hctz [hydrochlorothiazide], and Repatha [evolocumab]   Social History   Socioeconomic History   Marital status: Married    Spouse name: Not on file   Number of children: 2   Years of education: Not on file   Highest education level: Not on file  Occupational History   Occupation: SERVICE MANAGER    Employer: [employer]  Tobacco Use   Smoking status: Former    Packs/day: 0.50    Years: 29.00    Total pack years: 14.50    Types: Cigarettes    Quit date: 05/2022    Years since quitting: 0.1   Smokeless tobacco: Never  Tobacco comments:    Cut back to quarter pack a day  Vaping Use   Vaping Use: Never used  Substance and Sexual Activity   Alcohol use: Yes    Comment: Occasional   Drug use: No   Sexual activity: Not on file  Other Topics Concern   Not on file  Social History Narrative   Really is a married mother of 2.  1 son died from complications of spina bifida after multiple surgeries.   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     She has a has a Oceanographer in education.     Drinks social alcohol & denies drug use.     Pt endorses smoking maybe 2 cigarettes/day.  States she placed most of the cigarette, may have 2 puffs.   Social Determinants of Health   Financial Resource Strain: Low Risk  (10/31/2021)   Overall Financial Resource Strain (CARDIA)    Difficulty of Paying Living Expenses: Not hard at all  Food Insecurity: Unknown (06/06/2022)   Hunger Vital Sign    Worried About Running Out of Food in the Last Year: Patient refused    [place] in the Last Year: Patient refused  Transportation Needs: No Transportation Needs (06/06/2022)   PRAPARE - Hydrologist (Medical): No    Lack of Transportation (Non-Medical): No  Physical Activity: Inactive (10/31/2021)   Exercise Vital Sign    Days of Exercise per Week: 0 days    Minutes of Exercise per Session: 0 min  Stress: No Stress Concern Present (10/31/2021)   [place]    Feeling of Stress : Not at all  Social Connections: [place] (10/31/2021)   Social Connection and Isolation Panel [NHANES]    Frequency of Communication with Friends and Family: More than three times a week    Frequency of Social Gatherings with Friends and Family: More than three times a week    Attends Religious Services: More than 4 times per year    Active Member of [employer] or Organizations: Yes    Attends Music therapist: More than 4 times per year    Marital Status: Married     Family History: The patient's family history includes Cancer in her father and paternal grandfather; Leukemia in her mother and paternal grandmother; Lung cancer in her maternal grandfather; Prostate cancer in her father; Spina bifida in her son; Stroke in her maternal grandmother. There is no history of Colon cancer or Esophageal cancer.  ROS:   Please see the history of present illness.     EKGs/Labs/Other Studies Reviewed:    The following studies were reviewed :  Echocardiogram 09/13/2018: Study Conclusions: - Left ventricle: The cavity size was normal. Systolic function was    mildly reduced. The estimated ejection fraction was in the range    of 45% to 50%. Diffuse hypokinesis. Doppler parameters are    consistent with abnormal left ventricular relaxation (grade 1    diastolic dysfunction). Doppler parameters are consistent with    high ventricular filling pressure.  - Aortic valve: Trileaflet;  mildly thickened, mildly calcified    leaflets.  - Mitral valve: Calcified annulus. There was trivial regurgitation.  - Pulmonic valve: There was moderate regurgitation.  _______________  Left Cardiac Catheterization 1/95/0932: LV end diastolic pressure is normal. Prox Cx to Mid Cx lesion is 99% stenosed (CULPRIT LESION). Mid  Cx lesion is 55% stenosed. Prox LAD-1 lesion is 35% stenosed with 20% stenosed side branch in Ost 1st Diag. Prox LAD-2 lesion is 80% stenosed with 60% stenosed side branch in Ost 1st Sept. Prox RCA lesion is 70% stenosed.   Summary: Severe three-vessel disease with culprit lesion being 99% mid circumflex stenosis (followed by 60% lesion just beyond it), additional 80% proximal to mid LAD and likely 70% proximal RCA. Normal LVEDP. Reduced EF by echocardiogram with global hypokinesis.     Recommendation: Reviewed images with interventional colleague, will review with additional colleagues.  All 3 lesions are PCI amenable, but would likely require at least long stents in the LAD and circumflex and a moderate size stent in the RCA.  Concern going forward would be need for long-term antiplatelet therapy in a patient who has mandatory long-term anticoagulation with Xarelto. Will review images with interventional colleagues, would want to determine duration of antiplatelet therapy prior to deciding  PCI We will consult CVTS to provide surgical option with risks and benefits as well. Standard post cath hydration and TR band removal. Restart IV heparin 8 hours post pending decision for PCI versus CABG Continue with respect modification including statin and beta-blocker along with smoking cessation counseling       Will determine plan for PCI versus CABG in the next day or so, if possible would like to do PCI starting tomorrow if that is decision is made. Patient would like to be discussed the risks and benefits with her husband when no longer  sedated. _______________  Coronary Stent Intervention 09/14/2018: Prox LAD-1 lesion is 35% stenosed with 20% stenosed side branch in Ost 1st Diag. Prox LAD-2 lesion is 80% stenosed with 90% stenosed side branch in Ost 1st Sept. A drug-eluting stent was successfully placed using a STENT RESOLUTE ONYX 3.0X22. Post intervention, there is a 0% residual stenosis. Prox Cx to Mid Cx lesion is 99% stenosed. Mid Cx lesion is 55% stenosed. A drug-eluting stent was successfully placed using a STENT RESOLUTE ONYX 3.5X30. Post intervention, there is a 0% residual stenosis. Prox RCA lesion is 70% stenosed. DFR was 0.99   1. Successful PCI of the mid LCx with DES x 1 2. Successful PCI of the proximal to mid LAD with DES x 1. 3. Proximal RCA lesion with normal DFR of 0.99   Plan: ASA 81 mg daily for one month. Plavix 75 mg daily for one year. Plan resuming Xarelto 20 mg daily tomorrow. Anticipate DC tomorrow.   Diagnostic Dominance: Right  Intervention     _______________  Lower Extremity Arterial Ultrasound/ ABIs 02/27/2022: Summary:  Right: 50-74% stenosis noted in the superficial femoral artery. 75-99%  stenosis noted in the popliteal artery. Echogenic plaque noted throughout  extremity with evidence of luminal narrowing in some areas.   Left: Total occlusion noted in the superficial femoral artery. Echogenic  plaque noted throughout extremity with evidence of luminal narrowing in  some areas.   ABI Summary: Right: Resting right ankle-brachial index indicates moderate right lower  extremity arterial disease (0.57). The right toe-brachial index is abnormal.   Left: Resting left ankle-brachial index indicates moderate left lower  extremity arterial disease (0.52). The left toe-brachial index is abnormal.  _______________  Elwyn Reach Monitor 02/2022:   Zio Patch Wear Time:  12 days and 10 hours (2023-07-04T21:01:36-0400 to 2023-07-17T07:08:36-0400)   Predominant underlying rhythm was Sinus  Rhythm: HR Range 59-128 bpm, Avg 86 bpm   31 Atrial Runs: Fastest: 17 beats, HR Range 94 - 184  bpm, avg 136 bpm, 7.3 sec; Longest 39  beats, HR Range 76 - 144 bpm, Avg 107 bpm, 22.9 Sec.   Rare Isolated PACs & PVCs with very rare copulets & triplets.  Rare Ventriculat Bigeminy noted.   No Sustained Arrhythmias: Atrial tachycardia (AT) supraventricular tachycardia (SVT), atrial fibrillation (A-fib), atrial flutter (A-flutter), ventricular tachycardia (VT)   Additionally, symptoms noted PVCs, not with Atrial Runs.   Relatively normal study with fair number of asymptomatic atrial runs.  No sustained arrhythmias.   Symptoms are noted with rare PVCs. Relatively benign study. _______________  Peripheral Angiogram 06/02/2022: Angiographic Data:  1: Abdominal aorta-mild atherosclerosis 2: Left lower extremity-moderately long segment mid left SFA CTO with three-vessel runoff 3: Right lower extremity-short CTO right above-the-knee popliteal artery with two-vessel runoff.  The anterior tibial is occluded in its distal portion   Impression: Ms. Hyun has CTOs bilaterally, on the right just above the knee in the above-knee popliteal artery and a left mid SFA.  She is more symptomatic on the right.  We will proceed with right above-the-knee popliteal directional atherectomy followed by drug-coated balloon angioplasty using spider distal protection.  Final Impression: Successful Hawk 1 directional atherectomy followed by [facility] using spider distal protection of a short segment right above-the-knee popliteal artery CTO for claudication.  Patient does have lupus anticoagulant.  She was difficult to anticoagulate.  We will restart Lovenox bridge tonight and Xarelto tomorrow.  She will need to be on "triple therapy" for 1 month after which we will discontinue the aspirin.  The sheath will be removed once ACT falls below 170 pressure held.  She will be hydrated overnight and discharged home in the morning.  We will  obtain lower extremity arterial Doppler studies in unrefined office next week and I will see her back the week after.   _______________  Lower Extremity Arterial Ultrasound/ ABIs 06/12/2022: Summary:  Right: Moderate improvement is noted compared to previous study.   Atherosclerosis throughout.  1-49% stenosis in the above-the-knee popliteal artery, s/p atherectomy.   ABI Summary: Right: Resting right ankle-brachial index is within normal range. The  right toe-brachial index is abnormal.   Left: The left toe-brachial index is abnormal.   Right ABIs and TBIs appear increased to 0.95 compared to 0.57 on prior study on  02/28/2012. Left TBIs appear increased compared to prior study on  02/28/2012.  EKG:  EKG not ordered today.   Recent Labs: 02/27/2022: TSH 1.050 06/07/2022: ALT 10 06/09/2022: BUN 13; Creatinine, Ser 0.71; Hemoglobin 8.6; Platelets 405; Potassium 3.5; Sodium 136  Recent Lipid Panel    Component Value Date/Time   CHOL 207 (H) 06/03/2022 0519   CHOL 269 (H) 05/02/2021 0951   TRIG 327 (H) 06/03/2022 0519   TRIG 681 (HH) 10/27/2014 1155   HDL 26 (L) 06/03/2022 0519   HDL 33 (L) 05/02/2021 0951   HDL 29 (L) 10/27/2014 1155   CHOLHDL 8.0 06/03/2022 0519   VLDL 65 (H) 06/03/2022 0519   LDLCALC 116 (H) 06/03/2022 0519   LDLCALC 170 (H) 05/02/2021 0951   LDLCALC NOTES: 08/25/2013 1612   LDLDIRECT 141.0 11/25/2018 1008    Physical Exam:    Vital Signs: BP 138/76   Pulse 95   Ht 5' 4.5" (1.638 m)   Wt 150 lb 9.6 oz (68.3 kg)   SpO2 98%   BMI 25.45 kg/m     Wt Readings from Last 3 Encounters:  06/27/22 150 lb 9.6 oz (68.3 kg)  06/27/22 148 lb (67.1 kg)  06/05/22 149 lb 14.6 oz (68 kg)     General: 66 y.o. Caucasian female in no acute distress. HEENT: Normocephalic and atraumatic. Sclera clear.  Neck: Supple. No carotid bruits. No JVD. Heart: RRR. Distinct S1 and S2. No murmurs, gallops, or rubs.  Lungs: No increased work of breathing. Clear to  ausculation bilaterally. No wheezes, rhonchi, or rales.  Abdomen: Soft, non-distended, and non-tender to palpation.  Extremities: No lower extremity edema. Incision of left groin healing well with no erythema or drainage.  Skin: Warm and dry. Neuro: Alert and oriented x3. No focal deficits. Psych: Normal affect. Responds appropriately.   Assessment:    1. PAD (peripheral artery disease) ([place])   2. Pseudoaneurysm of left femoral artery ([place])   3. Coronary artery disease involving native coronary artery of native heart without angina pectoris   4. Primary hypertension   5. Hyperlipidemia, unspecified hyperlipidemia type   6. Acute blood loss anemia   7. Lupus anticoagulant syndrome ([place])   8. Tobacco abuse     Plan:    PAD Left Femoral Pseudoaneurysm Recent peripheral angiography on 06/02/2022 showed CTO of mid left SFA and short CTO of right above knee popliteal artery. Given she was more symptomatic on the right, decision was made to proceed with right above the knee popliteal direction atherectomy followed by drug-coated balloon angioplasty. She presented back to the ED a couple of days later with severe left groin pain and was found to have a pseudoaneurysm with active bleeding from the left common femoral artery access site into a 6.9 cm left inguinal hematoma. She underwent left thigh exploration with Vascular Surgery with evacuation of the large hematoma and suture hemostasis of arteriotomy and placed of wound VAC. Repeat lower extremity arterial ultrasound on 06/12/2022 showed moderate improvement on the right with 1-49% stenosis of the above-the-knee popliteal artery. Right ABI had normalized from 0.57 to.95.  - Recovering well. She was seen by Dr. Virl Cagey in Vascular Surgery today and had staples removed. - Plan was for triple therapy with Aspirin 88m daily, Plavix 711mdaily, and Xarelto 2036maily for 1 month after intervention at which time Aspirin can be stopped. However,  patient stopped the Aspirin given concern that it would then her blood too much. She also was under the impression that she was going to be able to stop her Plavix after 1 month. Advised patient not to stop Plavix until Dr. BerGwenlyn Foundstructs her to. She is scheduled to see him next week.  - Intolerant to statins in the past.  - Will repeat CBC today.   CAD History of NSTEMI in 08/2018 s/p DES to LAD and LCX. - No angina.  - Currently on triple therapy as above. Can stop Aspirin on 07/06/2022 but then will be continued on Plavix. - Intolerant to statins in the past.   Hypertension BP borderline elevated at 138/76. However, a few of her antihypertensive were just restarted/ increased a couple of days ago. - Continue current medications:Amlodipine 2.5mg25mily, Lisinopril 40mg31mly, and Coreg 3.125mg 56me daily. - BP goal <130/80. She does state that when her systolic BP has gotten as low as the 120s in the past, she has not tolerated this well and felt very tired and out of it. Advised patient to continue to monitor BP at home and notify us if [place] is increasing with systolic BP >130s.>784Oill repeat BMET today.  Hyperlipidemia Most recent lipid panel on 06/03/2022: Total Cholesterol 207, Triglycerides 327, HDL 26, LDL  116. LDL goal <55. - Continue Fenofibrate. - She has been intolerant to statins and PCKS9 inhibitors in the past. She has previously declined research trials and has not been interested in pursuing any further treatment.   Acute Blood Loss Anemia In setting of femoral pseudoaneurysm with active hemorrhage. Hemoglobin dropped as low as 7.5 during this recent admission and she required 2 units of PRBCs.  Hemoglobin stable at 8.6 on day of discharge. - Will repeat CBC today.   Lupus Anticoagulant Syndrome History of DVT and PE History of extensive lower extremity DVT and PE s/p endovascular therapy with stenting of lower extremity venous vasculature and placement of IVC filter.  Previously on Coumadin but has now been on chronic anticoagulation with Xarelto. - Continue Xarelto 43m daily.  Tobacco Abuse She has quit smoking since recent admission. Congratulated patient on this.  Disposition: Follow-up with Dr. BGwenlyn Foundnext week as previously scheduled.    Medication Adjustments/Labs and Tests Ordered: Current medicines are reviewed at length with the patient today.  Concerns regarding medicines are outlined above.  Orders Placed This Encounter  Procedures   CBC   Basic metabolic panel   No orders of the defined types were placed in this encounter.   Patient Instructions  Medication Instructions:  Your physician recommends that you continue on your current medications as directed. Please refer to the Current Medication list given to you today.  *If you need a refill on your cardiac medications before your next appointment, please call your pharmacy*   Lab Work: TODAY:  BMET & CBC  If you have labs (blood work) drawn today and your tests are completely normal, you will receive your results only by: [place](if you have MyChart) OR A paper copy in the mail If you have any lab test that is abnormal or we need to change your treatment, we will call you to review the results.   Testing/Procedures: None ordered   Follow-Up: [facility] you and your health needs are our priority.  As part of our continuing mission to provide you with exceptional heart care, we have created designated Provider Care Teams.  These Care Teams include your primary Cardiologist (physician) and Advanced Practice Providers (APPs -  Physician Assistants and Nurse Practitioners) who all work together to provide you with the care you need, when you need it.  We recommend signing up for the patient portal called "MyChart".  Sign up information is provided on this After Visit Summary.  MyChart is used to connect with patients for Virtual Visits (Telemedicine).   Patients are able to view lab/test results, encounter notes, upcoming appointments, etc.  Non-urgent messages can be sent to your provider as well.   To learn more about what you can do with MyChart, go to [email or web address]    Your next appointment:   Keep scheduled appt  The format for your next appointment:   In Person  Provider:   Dr. BGwenlyn Found  Other Instructions   Important Information About Sugar         Signed, CDarreld Mclean PA-C  06/27/2022 10:29 PM    [place]

## 2022-06-24 ENCOUNTER — Telehealth: Payer: Self-pay | Admitting: Cardiology

## 2022-06-24 MED ORDER — AMLODIPINE BESYLATE 2.5 MG PO TABS: 2.5000 mg | ORAL_TABLET | Freq: Two times a day (BID) | ORAL | 3 refills | Status: DC

## 2022-06-24 MED ORDER — LISINOPRIL 40 MG PO TABS: 40.0000 mg | ORAL_TABLET | Freq: Every day | ORAL | 3 refills | Status: DC

## 2022-06-24 NOTE — Telephone Encounter (Signed)
Patient stated that after surgery to clear a hematoma, she needed 2 units of blood, her BP was low, and her BP medications were changed. She was taken of amlodipine 2.'5mg'$  twice daily and decreased lisinopril from '40mg'$  to '10mg'$  daily. She is not taking ASA per her choice. Now that she is recovering, her BP this morning was 188/92, 188/90, 166/80 within a 2-hour period. She took '40mg'$  of lisinopril, hoping to get her BP down. She is asking to go back on original doses of amlodipine and lisinopril. Dr. Harl Bowie (DOD) agreed patient can go back to lisinopril '40mg'$  daily and amlodipine 2.5 mg twice daily. Patient advised of medication orders and to keep diary of BP and P. She has appointment with Virgie Dad on 11/10. Prescriptions sent to patient's preferred pharmacy.

## 2022-06-24 NOTE — Telephone Encounter (Signed)
Patient called to talk with Dr. Percival Spanish or nurse in regards to high blood pressure. Please call back to discuss

## 2022-06-27 ENCOUNTER — Ambulatory Visit: Payer: Medicare Other | Attending: Student | Admitting: Student

## 2022-06-27 ENCOUNTER — Encounter: Payer: Self-pay | Admitting: Student

## 2022-06-27 ENCOUNTER — Encounter: Payer: Self-pay | Admitting: Vascular Surgery

## 2022-06-27 ENCOUNTER — Ambulatory Visit: Payer: Medicare Other | Admitting: Vascular Surgery

## 2022-06-27 VITALS — BP 138/76 | HR 95 | Ht 64.5 in | Wt 150.6 lb

## 2022-06-27 VITALS — BP 154/97 | HR 86 | Temp 97.8°F | Resp 20 | Ht 64.5 in | Wt 148.0 lb

## 2022-06-27 DIAGNOSIS — I739 Peripheral vascular disease, unspecified: Secondary | ICD-10-CM

## 2022-06-27 DIAGNOSIS — I724 Aneurysm of artery of lower extremity: Secondary | ICD-10-CM

## 2022-06-27 DIAGNOSIS — I10 Essential (primary) hypertension: Secondary | ICD-10-CM

## 2022-06-27 DIAGNOSIS — D62 Acute posthemorrhagic anemia: Secondary | ICD-10-CM | POA: Diagnosis not present

## 2022-06-27 DIAGNOSIS — I2510 Atherosclerotic heart disease of native coronary artery without angina pectoris: Secondary | ICD-10-CM

## 2022-06-27 DIAGNOSIS — E785 Hyperlipidemia, unspecified: Secondary | ICD-10-CM | POA: Diagnosis not present

## 2022-06-27 DIAGNOSIS — D6862 Lupus anticoagulant syndrome: Secondary | ICD-10-CM

## 2022-06-27 DIAGNOSIS — Z720 Tobacco use: Secondary | ICD-10-CM | POA: Diagnosis not present

## 2022-06-27 NOTE — Progress Notes (Signed)
Office Note    HPI: [name] [name] is a 66 y.o. ([birth year]) female presenting in follow-up status post 06/06/2022 left common femoral artery primary repair for pseudoaneurysm status post endovascular intervention.  Exam today, [name] was doing well, accompanied by her brother.  She denied recent drainage, erythema, induration.  She has noticed that there is some firmness at the suture line which she wanted to ensure was normal.  Some cutaneous paresthesias in the thigh. She denied fevers, chills.  Past Medical History:  Diagnosis Date   Back pain    CAD S/P percutaneous coronary angioplasty 09/13/2018   09/13/2018 - Cath & Staged DES PCI on 09/14/2018: DES PCI: Cx99%&55% - Resolute Onyx DES 3.5 x 30 (3.6 mm), p-mLAD (prox of D1 almost to D2) - Resolute Onyx DES 3.0 x 22 (3.3 mm); DFR on pRCA 70% - 0.99, Not significant -> Rec Med Rx.   DVT (deep venous thrombosis) ([place])    Heart attack ([place]) 09/09/2018   Hypertension    Lupus anticoagulant syndrome (HCC)    NSTEMI (non-ST elevated myocardial infarction) ([place]) 09/12/2018   Initial presentation with chest pain was on 09/09/2018, recurrent pain on 1/26 2020 with positive troponins.  Cath showed three-vessel CAD -> declined CABG, opted for two-vessel DES PCI (LAD and CX, negative DFR RCA)   Pulmonary embolism ([place])     Past Surgical History:  Procedure Laterality Date   ABDOMINAL AORTOGRAM W/LOWER EXTREMITY N/A 06/02/2022   Procedure: ABDOMINAL AORTOGRAM W/LOWER EXTREMITY;  Surgeon: Lorretta Harp, MD;  Location: [place] [facility];  Service: Cardiovascular;  Laterality: N/A;   APPLICATION OF WOUND VAC Left 06/06/2022   Procedure: APPLICATION OF WOUND VAC LEFT GROIN;  Surgeon: Broadus John, MD;  Location: [place];  Service: Vascular;  Laterality: Left;   ARTERY REPAIR Left 06/06/2022   Procedure: LEFT COMMON FEMORAL ARTERY REPAIR WITH MORCELLS;  Surgeon: Broadus John, MD;  Location: [place];  Service: Vascular;  Laterality: Left;    BACK SURGERY     COLONOSCOPY  05/19/2012   Procedure: COLONOSCOPY;  Surgeon: Beryle Beams, MD;  Location: [place];  Service: Endoscopy;  Laterality: N/A;   COLONOSCOPY WITH PROPOFOL N/A 09/30/2018   Procedure: COLONOSCOPY WITH PROPOFOL;  Surgeon: Yetta Flock, MD;  Location: [place];  Service: Gastroenterology;  Laterality: N/A;   CORONARY STENT INTERVENTION N/A 09/14/2018   Procedure: CORONARY STENT INTERVENTION;  Surgeon: Martinique, Peter M, MD;  Location: [place] [facility];  Service: Cardiovascular: September 14, 2018- DES PCI: Cx99%&55% - Resolute Onyx DES 3.5 x 30 (3.6 mm), p-mLAD (prox of D1 almost to D2) - Resolute Onyx DES 3.0 x 22 (3.3 mm); DFR on pRCA 70% - 0.99, Not significant -> Rec Med Rx.   ESOPHAGOGASTRODUODENOSCOPY  05/19/2012   Procedure: ESOPHAGOGASTRODUODENOSCOPY (EGD);  Surgeon: Beryle Beams, MD;  Location: [facility];  Service: Endoscopy;  Laterality: N/A;   ESOPHAGOGASTRODUODENOSCOPY (EGD) WITH PROPOFOL N/A 09/30/2018   Procedure: ESOPHAGOGASTRODUODENOSCOPY (EGD) WITH PROPOFOL;  Surgeon: Yetta Flock, MD;  Location: [place];  Service: Gastroenterology;  Laterality: N/A;   GIVENS CAPSULE STUDY N/A 09/30/2018   Procedure: GIVENS CAPSULE STUDY;  Surgeon: Yetta Flock, MD;  Location: [place];  Service: Gastroenterology;  Laterality: N/A;   HEMATOMA EVACUATION Left 06/06/2022   Procedure: EVACUATION HEMATOMA LEFT GROIN;  Surgeon: Broadus John, MD;  Location: [place];  Service: Vascular;  Laterality: Left;   HOT HEMOSTASIS N/A 09/30/2018   Procedure: HOT HEMOSTASIS (ARGON PLASMA COAGULATION/BICAP);  Surgeon: Yetta Flock, MD;  Location: [facility];  Service: Gastroenterology;  Laterality: N/A;   INTRAVASCULAR PRESSURE WIRE/FFR STUDY N/A 09/14/2018   Procedure: INTRAVASCULAR PRESSURE WIRE/FFR STUDY;  Surgeon: Martinique, Peter M, MD;  Location: [place] [facility];  Service: Cardiovascular;  Laterality: RCA: DFR on pRCA 70% - 0.99, Not  significant -> Rec Med Rx.   LEFT HEART CATH AND CORONARY ANGIOGRAPHY N/A 09/13/2018   Procedure: LEFT HEART CATH AND CORONARY ANGIOGRAPHY;  Surgeon: Leonie Man, MD;  Location: [place] [facility];  Service: Cardiovascular:: CULPRIT LESION 99% p-mCx followed by 55%mCx; pLAD 35% A D25fllowed by long 80% lesion @ SP1); pRCA 70%. Normal LVEDP. Global HK EF ~45%.  -Decision on PCI deferred until discussion about PCI versus CABG.  Concern because of long-term DOAC.   PERIPHERAL VASCULAR ATHERECTOMY  06/02/2022   Procedure: PERIPHERAL VASCULAR ATHERECTOMY;  Surgeon: BLorretta Harp MD;  [place];  Service: Cardiovascular;;   PERIPHERAL VASCULAR BALLOON ANGIOPLASTY  06/02/2022   Procedure: PERIPHERAL VASCULAR BALLOON ANGIOPLASTY;  Surgeon: BLorretta Harp MD;  [place];  Service: Cardiovascular;;   TRANSTHORACIC ECHOCARDIOGRAM  09/13/2018   Mildly reduced EF 45 to 50% with diffuse HK.  GR 1 DD.  Mild aortic valve calcification.  MAC.  Moderate pulmonic regurgitation.   WOUND EXPLORATION Left 06/06/2022   Procedure: LEFT GROIN EXPLORATION;  Surgeon: RBroadus John MD;  [facility];  Service: Vascular;  Laterality: Left;    Social History   Socioeconomic History   Marital status: Married    Spouse name: Not on file   Number of children: 2   Years of education: Not on file   Highest education level: Not on file  Occupational History   Occupation: SERVICE MANAGER    Employer: [employer]  Tobacco Use   Smoking status: Former    Packs/day: 0.50    Years: 29.00    Total pack years: 14.50    Types: Cigarettes    Quit date: 05/2022    Years since quitting: 0.1   Smokeless tobacco: Never   Tobacco comments:    Cut back to quarter pack a day  Vaping Use   Vaping Use: Never used  Substance and Sexual Activity   Alcohol use: Yes    Comment: Occasional   Drug use: No   Sexual activity: Not on file  Other Topics Concern   Not on  file  Social History Narrative   Really is a married mother of 2.  1 son died from complications of spina bifida after multiple surgeries.   She currently works as a sTherapist, nutritionalat [employer]     She has a has a mOceanographerin education.     Drinks social alcohol & denies drug use.     Pt endorses smoking maybe 2 cigarettes/day.  States she placed most of the cigarette, may have 2 puffs.   Social Determinants of Health   Financial Resource Strain: Low Risk  (10/31/2021)   Overall Financial Resource Strain (CARDIA)    Difficulty of Paying Living Expenses: Not hard at all  Food Insecurity: Unknown (06/06/2022)   Hunger Vital Sign    Worried About Running Out of Food in the Last Year: Patient refused    RHazel Dellin the Last Year: Patient refused  Transportation Needs: No Transportation Needs (06/06/2022)   PRAPARE - THydrologist(Medical): No  Lack of Transportation (Non-Medical): No  Physical Activity: Inactive (10/31/2021)   Exercise Vital Sign    Days of Exercise per Week: 0 days    Minutes of Exercise per Session: 0 min  Stress: No Stress Concern Present (10/31/2021)   [place]    Feeling of Stress : Not at all  Social Connections: [place] (10/31/2021)   Social Connection and Isolation Panel [NHANES]    Frequency of Communication with Friends and Family: More than three times a week    Frequency of Social Gatherings with Friends and Family: More than three times a week    Attends Religious Services: More than 4 times per year    Active Member of [employer] or Organizations: Yes    Attends Music therapist: More than 4 times per year    Marital Status: Married  Human resources officer Violence: Not At Risk (06/06/2022)   Humiliation, Afraid, Rape, and Kick questionnaire    Fear of Current or Ex-Partner: No    Emotionally Abused: No     Physically Abused: No    Sexually Abused: No   Family History  Problem Relation Age of Onset   Leukemia Mother    Prostate cancer Father    Cancer Father    Stroke Maternal Grandmother    Lung cancer Maternal Grandfather    Leukemia Paternal Grandmother    Cancer Paternal Grandfather    Spina bifida Son        Multiple surgeries   Colon cancer Neg Hx    Esophageal cancer Neg Hx     Current Outpatient Medications  Medication Sig Dispense Refill   acetaminophen (TYLENOL) 500 MG tablet Take 500 mg by mouth daily as needed for mild pain or moderate pain.     acetaminophen (TYLENOL) 650 MG CR tablet Take 650 mg by mouth daily as needed for pain.     amLODipine (NORVASC) 2.5 MG tablet Take 1 tablet (2.5 mg total) by mouth 2 (two) times daily. 180 tablet 3   bisacodyl (DULCOLAX) 5 MG EC tablet Take 5 mg by mouth daily as needed for moderate constipation.     carvedilol (COREG) 6.25 MG tablet Take 0.5 tablets (3.125 mg total) by mouth 2 (two) times daily with a meal. 90 tablet 3   clopidogrel (PLAVIX) 75 MG tablet Take 1 tablet (75 mg total) by mouth daily. 90 tablet 3   fenofibrate 160 MG tablet Take 1 tablet (160 mg total) by mouth daily. 30 tablet 2   lisinopril (ZESTRIL) 40 MG tablet Take 1 tablet (40 mg total) by mouth daily. 90 tablet 3   nitroGLYCERIN (NITROSTAT) 0.4 MG SL tablet Place 1 tablet (0.4 mg total) under the tongue every 5 (five) minutes as needed for up to 3 doses for chest pain. 25 tablet 5   rivaroxaban (XARELTO) 20 MG TABS tablet Take 1 tablet (20 mg total) by mouth daily with supper. 42 tablet 0   No current facility-administered medications for this visit.    Allergies  Allergen Reactions   Prednisone Other (See Comments)    No energy "stalls out".    Makes pt  Jittery.   Tramadol Other (See Comments)    Constipation,  Makes pt  Jittery.   Crestor [Rosuvastatin Calcium]     Feels fatigued, RE  CHALLENGE - UNABLE TO USE 12/28/18   Hctz [Hydrochlorothiazide]      Dizzy    Repatha [Evolocumab] Other (See Comments)  Per patient never took this medication     REVIEW OF SYSTEMS:  _0  denotes positive finding, _1  denotes negative finding Cardiac  Comments:  Chest pain or chest pressure:    Shortness of breath upon exertion:    Short of breath when lying flat:    Irregular heart rhythm:        Vascular    Pain in calf, thigh, or hip brought on by ambulation:    Pain in feet at night that wakes you up from your sleep:     Blood clot in your veins:    Leg swelling:         Pulmonary    Oxygen at home:    Productive cough:     Wheezing:         Neurologic    Sudden weakness in arms or legs:     Sudden numbness in arms or legs:     Sudden onset of difficulty speaking or slurred speech:    Temporary loss of vision in one eye:     Problems with dizziness:         Gastrointestinal    Blood in stool:     Vomited blood:         Genitourinary    Burning when urinating:     Blood in urine:        Psychiatric    Major depression:         Hematologic    Bleeding problems:    Problems with blood clotting too easily:        Skin    Rashes or ulcers:        Constitutional    Fever or chills:      PHYSICAL EXAMINATION:  There were no vitals filed for this visit.  General:  WDWN in NAD; vital signs documented above Gait: Not observed HENT: WNL, normocephalic Pulmonary: normal non-labored breathing , without wheezing Cardiac: regular HR,  Abdomen: soft, NT, no masses Skin: without rashes Vascular Exam/Pulses:  Right Left  Radial 2+ (normal) 2+ (normal)  Ulnar    Femoral    Popliteal    DP triphasic multiphasic  PT triphasic multiphasic   Extremities: without ischemic changes, without Gangrene , without cellulitis; without open wounds;  Musculoskeletal: no muscle wasting or atrophy  Neurologic: A&O X 3;  No focal weakness or paresthesias are detected Psychiatric:  The pt has Normal affect.   Non-Invasive Vascular  Imaging:   -    ASSESSMENT/PLAN: Lataja Newland is a 66 y.o. female presenting status post left common femoral artery repair for pseudoaneurysm status post endovascular intervention.  Overall, Amirah is doing well.  Her left groin has healed nicely.  Staples removed. Cutaneous paraesthesias in the anterior thigh will likely improve over the coming months. May be permanent.  She has an excellent signal in the foot. At this time, I will transition her care back to Dr. Gwenlyn Found.  I asked that she continue her current medication regimen and call should any questions or concerns arise.   Broadus John, MD [facility] [phone]

## 2022-06-27 NOTE — Patient Instructions (Signed)
Medication Instructions:  Your physician recommends that you continue on your current medications as directed. Please refer to the Current Medication list given to you today.  *If you need a refill on your cardiac medications before your next appointment, please call your pharmacy*   Lab Work: TODAY:  BMET & CBC  If you have labs (blood work) drawn today and your tests are completely normal, you will receive your results only by: [place] (if you have MyChart) OR A paper copy in the mail If you have any lab test that is abnormal or we need to change your treatment, we will call you to review the results.   Testing/Procedures: None ordered   Follow-Up: At [facility], you and your health needs are our priority.  As part of our continuing mission to provide you with exceptional heart care, we have created designated Provider Care Teams.  These Care Teams include your primary Cardiologist (physician) and Advanced Practice Providers (APPs -  Physician Assistants and Nurse Practitioners) who all work together to provide you with the care you need, when you need it.  We recommend signing up for the patient portal called "MyChart".  Sign up information is provided on this After Visit Summary.  MyChart is used to connect with patients for Virtual Visits (Telemedicine).  Patients are able to view lab/test results, encounter notes, upcoming appointments, etc.  Non-urgent messages can be sent to your provider as well.   To learn more about what you can do with MyChart, go to [email or web address].    Your next appointment:   Keep scheduled appt  The format for your next appointment:   In Person  Provider:   Dr. Gwenlyn Found   Other Instructions   Important Information About Sugar

## 2022-06-28 LAB — BASIC METABOLIC PANEL
BUN/Creatinine Ratio: 12 (ref 12–28)
BUN: 10 mg/dL (ref 8–27)
CO2: 23 mmol/L (ref 20–29)
Calcium: 9.6 mg/dL (ref 8.7–10.3)
Chloride: 104 mmol/L (ref 96–106)
Creatinine, Ser: 0.85 mg/dL (ref 0.57–1.00)
Glucose: 93 mg/dL (ref 70–99)
Potassium: 4.8 mmol/L (ref 3.5–5.2)
Sodium: 143 mmol/L (ref 134–144)
eGFR: 75 mL/min/{1.73_m2} (ref >59)

## 2022-06-28 LAB — CBC
Hematocrit: 31.9 % — ABNORMAL LOW (ref 34.0–46.6)
Hemoglobin: 10.5 g/dL — ABNORMAL LOW (ref 11.1–15.9)
MCH: 28.5 pg (ref 26.6–33.0)
MCHC: 32.9 g/dL (ref 31.5–35.7)
MCV: 86 fL (ref 79–97)
Platelets: 515 10*3/uL — ABNORMAL HIGH (ref 150–450)
RBC: 3.69 x10E6/uL — ABNORMAL LOW (ref 3.77–5.28)
RDW: 14.3 % (ref 11.7–15.4)
WBC: 7.2 10*3/uL (ref 3.4–10.8)

## 2022-07-04 ENCOUNTER — Encounter: Payer: Self-pay | Admitting: Cardiovascular Disease

## 2022-07-04 ENCOUNTER — Ambulatory Visit: Payer: Medicare Other | Attending: Cardiovascular Disease | Admitting: Cardiovascular Disease

## 2022-07-04 VITALS — BP 125/77 | HR 90 | Ht 65.0 in | Wt 148.6 lb

## 2022-07-04 DIAGNOSIS — I739 Peripheral vascular disease, unspecified: Secondary | ICD-10-CM | POA: Diagnosis not present

## 2022-07-04 NOTE — Progress Notes (Signed)
07/04/2022 [name] [name]   1956/01/18  [id number]  Primary Physician Billie Ruddy, MD Primary Cardiologist: Lorretta Harp MD Garret Reddish, [place], [place]  HPI:  [name] [name] is a 66 y.o.   mild to moderately overweight married Caucasian female mother of 30, grandmother of 2 grandchildren referred by Almyra Deforest, PA-C for peripheral vascular valuation.  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  I last saw her in the office 05/23/2022.  Her risk factors include ongoing tobacco abuse 1/2 pack/day, treated hypertension and hyperlipidemia.  She does have CAD status post LAD and circumflex intervention back in January 2020.  She is never had a stroke.  She does have lupus anticoagulant with extensive lower extremity DVT and pulmonary emboli status post endovascular therapy with stenting of her lower extremity venous vasculature as well as placement of an IVC filter.  She was on Coumadin for some time and transition to Xarelto.  When she was on triple therapy for her LAD and circumflex stent she had GI bleeding.  Aspirin was discontinued.  She was not tolerant to Plavix.  She does complain of bilateral calf claudication over the last year with Dopplers performed 02/27/2022 revealing a right ABI of 0.57, left of 0.52.  She had high-grade lesions in her distal right SFA and popliteal artery and an occluded left SFA.   Since I saw her in the office 6 weeks ago I did perform peripheral angiography intervention 06/02/2022.  I performed [facility] 1 directional arthrectomy followed by [facility] short segment CTO in the mid right SFA.  She also had a mid left SFA CTO.  She was discharged home the following day and was readmitted that Friday with pain in her left groin, CT showed hematoma, Doppler showed pseudoaneurysm.  She underwent surgical evacuation and correction of the pseudoaneurysm by Dr. Unk Lightning.  She just saw Dr. Unk Lightning in the office last week  who noted that the incision was dry with some surrounding edema.  She does have some paresthesias down the medial aspect of her left thigh.  Her claudication has resolved and her right SFA remains widely patent by duplex ultrasound 06/12/2022.     Current Meds  Medication Sig   acetaminophen (TYLENOL) 500 MG tablet Take 500 mg by mouth daily as needed for mild pain or moderate pain.   acetaminophen (TYLENOL) 650 MG CR tablet Take 650 mg by mouth daily as needed for pain.   amLODipine (NORVASC) 2.5 MG tablet Take 1 tablet (2.5 mg total) by mouth 2 (two) times daily.   bisacodyl (DULCOLAX) 5 MG EC tablet Take 5 mg by mouth daily as needed for moderate constipation.   carvedilol (COREG) 6.25 MG tablet Take 0.5 tablets (3.125 mg total) by mouth 2 (two) times daily with a meal.   clopidogrel (PLAVIX) 75 MG tablet Take 1 tablet (75 mg total) by mouth daily.   fenofibrate 160 MG tablet Take 1 tablet (160 mg total) by mouth daily.   lisinopril (ZESTRIL) 40 MG tablet Take 1 tablet (40 mg total) by mouth daily.   nitroGLYCERIN (NITROSTAT) 0.4 MG SL tablet Place 1 tablet (0.4 mg total) under the tongue every 5 (five) minutes as needed for up to 3 doses for chest pain.   rivaroxaban (XARELTO) 20 MG TABS tablet Take 1 tablet (20 mg total) by mouth daily with supper.     Allergies  Allergen Reactions   Prednisone Other (See Comments)  No energy "stalls out".    Makes pt  Jittery.   Tramadol Other (See Comments)    Constipation,  Makes pt  Jittery.   Crestor [Rosuvastatin Calcium]     Feels fatigued, RE  CHALLENGE - UNABLE TO USE 12/28/18   Hctz [Hydrochlorothiazide]     Dizzy    Repatha [Evolocumab] Other (See Comments)    Per patient never took this medication    Social History   Socioeconomic History   Marital status: Married    Spouse name: Not on file   Number of children: 2   Years of education: Not on file   Highest education level: Not on file  Occupational History   Occupation:  [place]    Employer: [employer]  Tobacco Use   Smoking status: Former    Packs/day: 0.50    Years: 29.00    Total pack years: 14.50    Types: Cigarettes    Quit date: 05/2022    Years since quitting: 0.1   Smokeless tobacco: Never   Tobacco comments:    Cut back to quarter pack a day  Vaping Use   Vaping Use: Never used  Substance and Sexual Activity   Alcohol use: Yes    Comment: Occasional   Drug use: No   Sexual activity: Not on file  Other Topics Concern   Not on file  Social History Narrative   Really is a married mother of 2.  1 son died from complications of spina bifida after multiple surgeries.   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     She has a has a Oceanographer in education.     Drinks social alcohol & denies drug use.     Pt endorses smoking maybe 2 cigarettes/day.  States she placed most of the cigarette, may have 2 puffs.   Social Determinants of Health   Financial Resource Strain: Low Risk  (10/31/2021)   Overall Financial Resource Strain (CARDIA)    Difficulty of Paying Living Expenses: Not hard at all  Food Insecurity: Unknown (06/06/2022)   Hunger Vital Sign    Worried About Running Out of Food in the Last Year: Patient refused    [place] in the Last Year: Patient refused  Transportation Needs: No Transportation Needs (06/06/2022)   PRAPARE - Hydrologist (Medical): No    Lack of Transportation (Non-Medical): No  Physical Activity: Inactive (10/31/2021)   Exercise Vital Sign    Days of Exercise per Week: 0 days    Minutes of Exercise per Session: 0 min  Stress: No Stress Concern Present (10/31/2021)   [place]    Feeling of Stress : Not at all  Social Connections: [place] (10/31/2021)   Social Connection and Isolation Panel [NHANES]    Frequency of Communication with Friends and Family:  More than three times a week    Frequency of Social Gatherings with Friends and Family: More than three times a week    Attends Religious Services: More than 4 times per year    Active Member of [employer] or Organizations: Yes    Attends Archivist Meetings: More than 4 times per year    Marital Status: Married  Human resources officer Violence: Not At Risk (06/06/2022)   Humiliation, Afraid, Rape, and Kick questionnaire    Fear of Current or Ex-Partner: No    Emotionally  Abused: No    Physically Abused: No    Sexually Abused: No     Review of Systems: General: negative for chills, fever, night sweats or weight changes.  Cardiovascular: negative for chest pain, dyspnea on exertion, edema, orthopnea, palpitations, paroxysmal nocturnal dyspnea or shortness of breath Dermatological: negative for rash Respiratory: negative for cough or wheezing Urologic: negative for hematuria Abdominal: negative for nausea, vomiting, diarrhea, bright red blood per rectum, melena, or hematemesis Neurologic: negative for visual changes, syncope, or dizziness All other systems reviewed and are otherwise negative except as noted above.    Blood pressure 125/77, pulse 90, height '5\' 5"'$  (1.651 m), weight 148 lb 9.6 oz (67.4 kg), SpO2 99 %.  General appearance: alert and no distress Neck: no adenopathy, no carotid bruit, no JVD, supple, symmetrical, trachea midline, and thyroid not enlarged, symmetric, no tenderness/mass/nodules Lungs: clear to auscultation bilaterally Heart: regular rate and rhythm, S1, S2 normal, no murmur, click, rub or gallop Extremities: Edema left groin around the previous incision of the incision is dry. Pulses: Intact right pedal pulse Skin: Skin color, texture, turgor normal. No rashes or lesions Neurologic: Grossly normal  EKG not performed today  ASSESSMENT AND PLAN:   PAD (peripheral artery disease) (HCC) History of PAD status post directional atherectomy followed by [facility] of a  short segment CTO right SFA on 06/02/2022.  She did have a left SFA CTO.  She was discharged the following day and was readmitted later that week with a pain in her left groin ended up being a pseudoaneurysm and hematoma.  This was operated on by Dr. Unk Lightning with an excellent result.  Her follow-up Doppler studies performed in our office 06/12/2022 revealed a right ABI of 0.95 with a widely patent right SFA.  We will repeat Doppler studies in 6 months.      Lorretta Harp MD FACP,FACC,FAHA, [facility] 07/04/2022 11:02 AM

## 2022-07-04 NOTE — Patient Instructions (Signed)
Medication Instructions:  Your physician recommends that you continue on your current medications as directed. Please refer to the Current Medication list given to you today.  *If you need a refill on your cardiac medications before your next appointment, please call your pharmacy*   Testing/Procedures: Your physician has requested that you have a lower extremity arterial duplex. During this test, ultrasound is used to evaluate arterial blood flow in the legs. Allow one hour for this exam. There are no restrictions or special instructions. This will take place at [place], [address]. To be done in April 2024.  Your physician has requested that you have an ankle brachial index (ABI). During this test an ultrasound and blood pressure cuff are used to evaluate the arteries that supply the arms and legs with blood. Allow thirty minutes for this exam. There are no restrictions or special instructions. This will take place at [place], [address].  To be done in April 2024.     Follow-Up: At [facility], you and your health needs are our priority.  As part of our continuing mission to provide you with exceptional heart care, we have created designated Provider Care Teams.  These Care Teams include your primary Cardiologist (physician) and Advanced Practice Providers (APPs -  Physician Assistants and Nurse Practitioners) who all work together to provide you with the care you need, when you need it.  We recommend signing up for the patient portal called "MyChart".  Sign up information is provided on this After Visit Summary.  MyChart is used to connect with patients for Virtual Visits (Telemedicine).  Patients are able to view lab/test results, encounter notes, upcoming appointments, etc.  Non-urgent messages can be sent to your provider as well.   To learn more about what you can do with MyChart, go to [email or web address].    Your next appointment:   6 month(s)  The  format for your next appointment:   In Person  Provider:   Quay Burow, MD

## 2022-07-04 NOTE — Assessment & Plan Note (Signed)
History of PAD status post directional atherectomy followed by [facility] of a short segment CTO right SFA on 06/02/2022.  She did have a left SFA CTO.  She was discharged the following day and was readmitted later that week with a pain in her left groin ended up being a pseudoaneurysm and hematoma.  This was operated on by Dr. Unk Lightning with an excellent result.  Her follow-up Doppler studies performed in our office 06/12/2022 revealed a right ABI of 0.95 with a widely patent right SFA.  We will repeat Doppler studies in 6 months.

## 2022-07-07 ENCOUNTER — Telehealth: Payer: Self-pay | Admitting: *Deleted

## 2022-07-07 DIAGNOSIS — D62 Acute posthemorrhagic anemia: Secondary | ICD-10-CM

## 2022-07-07 DIAGNOSIS — I724 Aneurysm of artery of lower extremity: Secondary | ICD-10-CM

## 2022-07-07 DIAGNOSIS — I2510 Atherosclerotic heart disease of native coronary artery without angina pectoris: Secondary | ICD-10-CM

## 2022-07-07 NOTE — Telephone Encounter (Signed)
Darreld Mclean, PA-C 06/30/2022  2:19 PM EST     Please notify patient of results: Hemoglobin has improved since discharge which is good. Platelets are elevated which can be seen after surgery. I would just recommend repeating a CBC in about 1 month to make sure this is not continuing to increase. Otherwise, labs look good.   Thank you!

## 2022-07-07 NOTE — Telephone Encounter (Signed)
The patient has been notified of the result and verbalized understanding.  All questions (if any) were answered. Aware to have CBC done in one month . Lab order placed. Raiford Simmonds, RN 07/07/2022 4:34 PM

## 2022-07-07 NOTE — Telephone Encounter (Signed)
-----   Message from Leonie Man, MD sent at 06/28/2022  3:00 PM EST ----- Appropriate increase in Hgb after transfusion.   Look good.   [place]

## 2022-07-15 NOTE — Telephone Encounter (Signed)
[name] Harp, MD  You    That's her decision. There's really no data in the periphery like there is in the coronary circulation. I typically recommend 6 months for neo intimal to grow but if she's adamant it's her decision.  JJB

## 2022-07-18 ENCOUNTER — Other Ambulatory Visit: Payer: Self-pay

## 2022-07-18 DIAGNOSIS — I2510 Atherosclerotic heart disease of native coronary artery without angina pectoris: Secondary | ICD-10-CM | POA: Diagnosis not present

## 2022-07-18 DIAGNOSIS — D62 Acute posthemorrhagic anemia: Secondary | ICD-10-CM | POA: Diagnosis not present

## 2022-07-18 DIAGNOSIS — I724 Aneurysm of artery of lower extremity: Secondary | ICD-10-CM | POA: Diagnosis not present

## 2022-07-18 LAB — CBC
Hematocrit: 39.4 % (ref 34.0–46.6)
Hemoglobin: 12.7 g/dL (ref 11.1–15.9)
MCH: 28.5 pg (ref 26.6–33.0)
MCHC: 32.2 g/dL (ref 31.5–35.7)
MCV: 88 fL (ref 79–97)
Platelets: 581 10*3/uL — ABNORMAL HIGH (ref 150–450)
RBC: 4.46 x10E6/uL (ref 3.77–5.28)
RDW: 13.7 % (ref 11.7–15.4)
WBC: 9.4 10*3/uL (ref 3.4–10.8)

## 2022-07-21 NOTE — Telephone Encounter (Signed)
Berry, on the follow-up CBC was that both the hemoglobin level and platelet levels are stable and not going back down.  I think given her risk factors, it would be reasonable to consider CABG.  Glenetta Hew, MD

## 2022-07-23 NOTE — Telephone Encounter (Signed)
Clarification --COVID not CABG  [facility]

## 2022-07-26 DIAGNOSIS — R52 Pain, unspecified: Secondary | ICD-10-CM | POA: Diagnosis not present

## 2022-07-26 DIAGNOSIS — Z20822 Contact with and (suspected) exposure to covid-19: Secondary | ICD-10-CM | POA: Diagnosis not present

## 2022-07-26 DIAGNOSIS — R051 Acute cough: Secondary | ICD-10-CM | POA: Diagnosis not present

## 2022-07-26 DIAGNOSIS — J329 Chronic sinusitis, unspecified: Secondary | ICD-10-CM | POA: Diagnosis not present

## 2022-08-08 DIAGNOSIS — J209 Acute bronchitis, unspecified: Secondary | ICD-10-CM | POA: Diagnosis not present

## 2022-08-08 DIAGNOSIS — R059 Cough, unspecified: Secondary | ICD-10-CM | POA: Diagnosis not present

## 2022-08-08 DIAGNOSIS — J019 Acute sinusitis, unspecified: Secondary | ICD-10-CM | POA: Diagnosis not present

## 2022-08-09 ENCOUNTER — Other Ambulatory Visit: Payer: Self-pay | Admitting: Cardiology

## 2022-08-14 NOTE — Telephone Encounter (Signed)
Appt has been scheduled.

## 2022-08-22 ENCOUNTER — Ambulatory Visit (INDEPENDENT_AMBULATORY_CARE_PROVIDER_SITE_OTHER): Payer: Medicare Other | Admitting: Family Medicine

## 2022-08-22 ENCOUNTER — Telehealth: Payer: Self-pay | Admitting: Family Medicine

## 2022-08-22 ENCOUNTER — Ambulatory Visit (INDEPENDENT_AMBULATORY_CARE_PROVIDER_SITE_OTHER): Payer: Medicare Other

## 2022-08-22 VITALS — BP 144/72 | HR 95 | Temp 98.5°F | Wt 146.8 lb

## 2022-08-22 DIAGNOSIS — R052 Subacute cough: Secondary | ICD-10-CM | POA: Diagnosis not present

## 2022-08-22 DIAGNOSIS — I2510 Atherosclerotic heart disease of native coronary artery without angina pectoris: Secondary | ICD-10-CM | POA: Diagnosis not present

## 2022-08-22 DIAGNOSIS — R059 Cough, unspecified: Secondary | ICD-10-CM | POA: Diagnosis not present

## 2022-08-22 MED ORDER — HYDROCODONE BIT-HOMATROP MBR 5-1.5 MG/5ML PO SOLN: 5.0000 mL | Freq: Every evening | ORAL | 0 refills | Status: DC | PRN

## 2022-08-22 MED ORDER — DOXYCYCLINE HYCLATE 100 MG PO TABS: 100.0000 mg | ORAL_TABLET | Freq: Two times a day (BID) | ORAL | 0 refills | Status: AC

## 2022-08-22 NOTE — Patient Instructions (Addendum)
We will obtain a chest x-ray today to evaluate for pneumonia or worsening bronchitis.  I have included some information about COPD exacerbations given your smoking history.  Prescriptions for doxycycline an antibiotic and cough medicine Hycodan was sent to your pharmacy.  Please let us know if you feel like you are getting worse or having new symptoms.

## 2022-08-22 NOTE — Telephone Encounter (Signed)
Okay.  It was not specified which [facility] [address] pt wanted med sent to, but it was sent to the [address] location.

## 2022-08-22 NOTE — Progress Notes (Signed)
Acute Office Visit  Subjective:     Patient ID: [name] [name], female    DOB: 1956-05-23, 67 y.o.   MRN: [id number]  Chief Complaint  Patient presents with   Follow-up    Pt reports she got the flu first wk of December. Was treated with amoxicillin and inhaler. Then was seen with [facility]. [facility] in [place] 10 days ago for cough. Completed 10 days of amoxicillin and albuterol inhaler on 08/18/2022. Pt updates her cough has not gone way. Keeping her awake at night.     HPI Patient is a 67 yo female with pmh sig for h/o MI, CAD s/p PCI, chronic anticoagulation 2/2 PVT and PE, HTN, pseudoaneurysm of L femoral artery in today for ongoing concern.  Pt [facility] in early Dec for influenza.  Pt returned to UC due to continued cough.  Dx'd with bronchitis and sinusitis. Completed 10 d course amoxicillin on 08/18/22, but cough continued. Also given albuterol inhaler.  Cough is productive with a rattle sound, has wheezing.  Pt smoking less than 1/2 ppd.  Review of Systems  Respiratory:  Positive for cough.   Positive symptoms as stated above, otherwise negative.      Objective:    BP (!) 144/72 (BP Location: Right Arm, Patient Position: Sitting, Cuff Size: Normal)   Pulse 95   Temp 98.5 F (36.9 C) (Oral)   Wt 146 lb 12.8 oz (66.6 kg)   SpO2 97%   BMI 24.43 kg/m    Physical Exam Constitutional:      Appearance: Normal appearance.  HENT:     Head: Normocephalic and atraumatic.     Left Ear: Tympanic membrane normal.     Nose: Nose normal.  Eyes:     Extraocular Movements: Extraocular movements intact.     Conjunctiva/sclera: Conjunctivae normal.     Pupils: Pupils are equal, round, and reactive to light.  Cardiovascular:     Rate and Rhythm: Normal rate and regular rhythm.     Heart sounds: Normal heart sounds. No murmur heard.    No gallop.  Pulmonary:     Effort: Pulmonary effort is normal.     Breath sounds: Normal breath sounds.     Comments:  cough Musculoskeletal:        General: No swelling.     Right lower leg: No edema.     Left lower leg: No edema.  Skin:    General: Skin is warm and dry.  Neurological:     Mental Status: She is alert and oriented to person, place, and time. Mental status is at baseline.     No results found for any visits on 08/22/22.      Assessment & Plan:   Problem List Items Addressed This Visit   None Visit Diagnoses     Subacute cough    -  Primary   Relevant Medications   HYDROcodone bit-homatropine (HYCODAN) 5-1.5 MG/5ML syrup   Other Relevant Orders   DG Chest 2 View (Completed)       Meds ordered this encounter  Medications   doxycycline (VIBRA-TABS) 100 MG tablet    Sig: Take 1 tablet (100 mg total) by mouth 2 (two) times daily for 7 days.    Dispense:  14 tablet    Refill:  0   HYDROcodone bit-homatropine (HYCODAN) 5-1.5 MG/5ML syrup    Sig: Take 5 mLs by mouth at bedtime as needed for cough.    Dispense:  120 mL  Refill:  0   Continued cough s/p course of amoxicillin.  Obtain CXR to evaluate for pna.  Treat like COPD exacerbation.  Will not start steroid 2/2 intolerance to prednisone.  Start doxycycline and hycodan cough syrup qhs prn.   Continue supportive care.  Given strict precautions.    Return if symptoms worsen or fail to improve.  Billie Ruddy, MD

## 2022-08-22 NOTE — Telephone Encounter (Signed)
Pt was notified on that. Request for [place] to call pharmacy.   Call the pharmacy and they stated they don't have the rx also, it is in back order.   Attempted to reach pt twice at mobile number. Voicemail has not been set up. Unable to leave message.

## 2022-08-22 NOTE — Telephone Encounter (Signed)
Pt is calling and would like [facility] [address], [place] no one has HYDROcodone bit-homatropine (HYCODAN) 5-1.5 MG/5ML syrup  in stock. Please advise

## 2022-08-25 NOTE — Telephone Encounter (Signed)
Updates patient while on phone for x-ray result, that we tried to reach her on Friday regarding to cough syrup. Pt updates she is taking her husband cough syrup, not coughing as much at night. Advise patient to give [place] a call when she find a pharmacy that has the cough syrup or she can contact her pharmacy to give her a call when once they have the med.  Verbalized understanding.

## 2022-08-26 ENCOUNTER — Ambulatory Visit: Payer: Medicare Other | Attending: Cardiology | Admitting: Cardiology

## 2022-08-26 ENCOUNTER — Encounter: Payer: Self-pay | Admitting: Cardiology

## 2022-08-26 VITALS — BP 148/78 | HR 97 | Ht 64.0 in | Wt 146.2 lb

## 2022-08-26 DIAGNOSIS — I2510 Atherosclerotic heart disease of native coronary artery without angina pectoris: Secondary | ICD-10-CM | POA: Diagnosis not present

## 2022-08-26 DIAGNOSIS — E7849 Other hyperlipidemia: Secondary | ICD-10-CM | POA: Diagnosis not present

## 2022-08-26 DIAGNOSIS — D6859 Other primary thrombophilia: Secondary | ICD-10-CM | POA: Diagnosis not present

## 2022-08-26 DIAGNOSIS — I724 Aneurysm of artery of lower extremity: Secondary | ICD-10-CM

## 2022-08-26 DIAGNOSIS — Z86711 Personal history of pulmonary embolism: Secondary | ICD-10-CM | POA: Diagnosis not present

## 2022-08-26 DIAGNOSIS — I10 Essential (primary) hypertension: Secondary | ICD-10-CM

## 2022-08-26 DIAGNOSIS — G720 Drug-induced myopathy: Secondary | ICD-10-CM | POA: Diagnosis not present

## 2022-08-26 DIAGNOSIS — I739 Peripheral vascular disease, unspecified: Secondary | ICD-10-CM

## 2022-08-26 DIAGNOSIS — Z716 Tobacco abuse counseling: Secondary | ICD-10-CM

## 2022-08-26 DIAGNOSIS — Z720 Tobacco use: Secondary | ICD-10-CM | POA: Diagnosis not present

## 2022-08-26 DIAGNOSIS — I214 Non-ST elevation (NSTEMI) myocardial infarction: Secondary | ICD-10-CM

## 2022-08-26 MED ORDER — RIVAROXABAN 20 MG PO TABS: 20.0000 mg | ORAL_TABLET | Freq: Every day | ORAL | 3 refills | Status: DC

## 2022-08-26 MED ORDER — PRAVASTATIN SODIUM 20 MG PO TABS: 20.0000 mg | ORAL_TABLET | Freq: Every evening | ORAL | 6 refills | Status: DC

## 2022-08-26 NOTE — Progress Notes (Signed)
Primary Care Provider: Billie Ruddy, [place] Cardiologist: Glenetta Hew, MD  PV Cardiologist: Dr. Gwenlyn Found Vascular Surgeon: Dr. Unk Lightning Electrophysiologist: None  Clinic Note: Chief Complaint  Patient presents with   Follow-up    12-monthgeneral cardiology follow-up.  Had the flu in December.  Slowly getting back to normal again.   Coronary Artery Disease    Doing fairly well.  No major issues. Had    ===================================  ASSESSMENT/PLAN   Problem List Items Addressed This Visit       Cardiology Problems   Non-ST elevation (NSTEMI) myocardial infarction (HCC) (Chronic)    4 years out from MI, doing well.  No further angina symptoms.  She has had two-vessel PCI.  Unfortunately because of her need to be on long-term Xarelto, we are not continue to cover with Thienopyridine.  She is on monotherapy with Xarelto.  Thankfully, no active angina or heart failure symptoms.       Relevant Medications   rivaroxaban (XARELTO) 20 MG TABS tablet   pravastatin (PRAVACHOL) 20 MG tablet   Other Relevant Orders   EKG 12-Lead (Completed)   Familial hyperlipidemia (Chronic)    Most recent lipids showed LDL of 116 which is not as bad as of expected.  Unfortunately, we still would like it to be less than 70.  Has tried simvastatin, atorvastatin, rosuvastatin all of which cause myalgias.  She had similar symptoms on Zetia and did not tolerate Repatha.  Was not interested in trying Praluent or inclisiran.  She is now willing to try low-dose pravastatin.  Perhaps if she is able to tolerate pravastatin we can potentially convince her to go forward with inclisiran evaluation.  Plan: Gently start Pravachol   Pravastatin 20 mg  the first week  1/2 tablet  for 3 days  week, then  2nd week  take 5 days week, 3rd week  7 days  for 2 weeks then increase  20 mg  -> if not tolerated, go back to 10 mg.  If it anytime she does not tolerate the next move up, then go back  to the previous setting that she was able to tolerate.  Recheck lipids and chemistry panel in May      Relevant Medications   rivaroxaban (XARELTO) 20 MG TABS tablet   pravastatin (PRAVACHOL) 20 MG tablet   Other Relevant Orders   EKG 12-Lead (Completed)   Lipid panel   Comprehensive metabolic panel   Pseudoaneurysm of left femoral artery ([facility]    Surgical repair by Dr. RUnk Lightning  Seems to doing relatively well.      Relevant Medications   rivaroxaban (XARELTO) 20 MG TABS tablet   pravastatin (PRAVACHOL) 20 MG tablet   PAD (peripheral artery disease) (HCC) (Chronic)    Followed by Dr. BGwenlyn Found  Thankfully, the right ABI is improved back to 0.95.  She is not having any significant left leg claudication at this point.  She is really very reluctant to think about doing any further procedures unless likely clearly she has critical limb ischemia.  For now we will continue trying to treat her risk factors.  She is actually willing to try taking pravastatin.  Continue to recommend walking as this is probably the best bet to increase collateral flow distally.      Relevant Medications   rivaroxaban (XARELTO) 20 MG TABS tablet   pravastatin (PRAVACHOL) 20 MG tablet   Essential hypertension (Chronic)    BP is a little high today.  I  rechecked and it was slightly lower.  Initial blood pressure was 158/80.  She tells me at home her pressure was 128/70.  She will continue to monitor at home.  For now continue current dose of amlodipine which she has not taken 2.5 mg twice daily as well as carvedilol 3.125 mg twice daily. => If pressures were tolerate, I would first increase carvedilol back to 6.25 mg twice daily because of her resting heart rate in the 90s.  (However, in the past there was concern for possible fatigue with higher dose.) Otherwise continue lisinopril 40 mg daily.  She is not on diuretic, has had issues with them in the past.      Relevant Medications   rivaroxaban (XARELTO) 20  MG TABS tablet   pravastatin (PRAVACHOL) 20 MG tablet   Coronary artery disease involving native heart without angina pectoris - Primary (Chronic)    Doing well, no active angina or heart failure symptoms.  Plan: She is on low-dose amlodipine and carvedilol for antianginal benefit along with high-dose lisinopril for afterload reduction. Currently only on fenofibrate for lipids.  We will start pravastatin at low-dose, and gradually titrate up. Not on aspirin or Plavix because of Xarelto.      Relevant Medications   rivaroxaban (XARELTO) 20 MG TABS tablet   pravastatin (PRAVACHOL) 20 MG tablet   Other Relevant Orders   Lipid panel   Comprehensive metabolic panel     Other   Statin myopathy    She has tried multiple different statins.  Has been reluctant to try anything else-including Zetia and bempedoic acid.  She did not tolerate Repatha.  Was not interested in it).  Now that she has had issues with PAD as well as CAD, she is willing to try low-dose pravastatin.  Will follow-up closely.      Tobacco use (Chronic)   Hypercoagulation syndrome (HCC) (Chronic)    Long symptoms anticoagulant syndrome with history of DVT and PEs.  As such, she is on lifelong Xarelto which is actually reasonable to bridge for procedures.  Trying to avoid the use of aspirin or Plavix in conjunction with Xarelto because she has had bleeding issues.      History of pulmonary embolus (PE) (Chronic)   Encounter for smoking cessation counseling (Chronic)    She is really had a hard time she had done well having fully quit, but because of stress over the holidays went back to smoking.  She is down to maybe 2 or 3 a day.  Hopefully she will be able to fully.      ===================================  HPI:    [name] [name] is a 67 y.o. female with a PMH below who presents today for 35-monthfollow-up.  PMH notable for: Recurrent DVT-PE related to LUPUS ANTICOAGULANT Reluctant to take antiplatelet  agent along with DOAC (despite the fact to be clearly said she needed to be on it post PCI. ->   No longer on Thienopyridine after PAD procedure.. 3V CAD (NSTEMI Jan 2020 --> she opted for PCI and medical management over CABG -> LAD and LCx PCI with medical management of RCA. PAD: -> ABIs-R0.57, L 0.52.  Occluded L SFA, high-grade distal R SFA and Pop A Angiography revealed Right AK Pop A CTO & L SFA CTO --> s/p thrombolic atherectomy and DCB PTCA of R Pop A Likely from familial hyperlipidemia with difficult to control lipids: Intolerant of simvastatin, atorvastatin, rosuvastatin as well as Vascepa and PCSK9 inhibitors (Repatha)  Seen and CVRR  lipid clinic-apparently did not tolerate Repatha. Followed by Dr. Debara Pickett -> has not yet gone for evaluation for inclisiran. Hypertension also difficult to control due to medication intolerance: Did not tolerate thiazides or spironolactone. Currently taking amlodipine 5 mg twice daily and taking 1/2 of a 6.25 mg carvedilol tablet (3.125 mg) twice daily.  I last saw Merari Pion in September 2022.  She was doing well with no major issues.  Recent Hospitalizations/Clinic Visits:  February 03, 2022-ER visit for chest pain and PVCs.  Ruled out for MI. Seen by Almyra Deforest, PA on June 29 for follow-up.  He indicated that she actually denied ever having chest pain.  She had noted palpitations for several weeks.  Described as a fluttering in the chest.  No other associated symptoms. => 2-week Zio patch ordered along with TSH.  Also recommended ABIs because of bilateral calf claudication symptoms. She was initially seen by Dr. Dr. Quay Burow on August 4 in response to lower extremity Dopplers showing ABIs 0.57 on the right and 0.52 on the left and high-grade lesions in the distal R SFA and Pop A as well as occluded L SFA. => In the absence of critical limb ischemia, and the intention to try to avoid triple therapy with aspirin Plavix and Xarelto, the initially planned  medical management. Seen by Diona Browner, NP on 05/01/2022 -> noted that her palpitations probably influenced by stress.  Noted persistent claudication, still smoking.  Noted that Zio patch was relatively benign.  Asymptomatic atrial runs but symptomatic PVCs.  Doing well on carvedilol.  Indicated that she was not interested in doing any further therapy other than fenofibrate for her lipids.  She was seen in follow-up with Dr. Gwenlyn Found on October 6-at this time she was noting more significant almost resting right-sided foot and calf pain with dependent rubor and resting claudication.  Concern for critical limb ischemia.  At this point I decided to proceed with peripheral angiography using Lovenox bridge. 1016/2023: BLE Angio -Angiography revealed Right AK Pop A CTO & L SFA CTO --> s/p thrombolic atherectomy and DCB PTCA of R Pop A 06/06/2022: Return to ER with left groin pain.   Procedure complication: Left common femoral artery pseudoaneurysm requiring evacuation of large hematoma and suture hemostasis of arteriotomy plus wound VAC. => Had significant anemia with hemoglobin dropped to 7.5 requiring 2 units PRBC.  Seen by Sande Rives, PA on June 27, 2022 for follow-up after pseudoaneurysm.  Repeat LE Doppler showed moderate improvement in the right with less than 50% stenosis in the AK Pop A.  Right ABI improved to 0.95.  Doing fairly well.  Noted some numbness over the incision site on the left leg.  Denies any claudication.  Just not walking as fast as before. => Indicated that she did not want any further vascular procedures in the future.  No active cardiac-angina/CHF/palpitation symptoms.   She had fully quit smoking, but is subsequently reverted back to smoking. She also stopped her aspirin and has now subsequently been taken off of Plavix. Had noted that her blood pressure was getting back up high after having been off of her medications for her pseudoaneurysm.  Had restarted amlodipine 2.5 mg  daily along with lisinopril 40 mg daily..  Reviewed  CV studies:    The following studies were reviewed today: (if available, images/films reviewed: From Epic Chart or Care Everywhere) 02/2022: R ABI 0.57, L ABI 0.52. 50-74% R SFA & 75-99% PopA, L SFA CTO Zio 02/2022: SR -  59-128, avg 86 bpm. 31 Atrial Runs (17 beats - avg 136 bpm w/ max 184 bpm; 39 beats - avg 107 bpm, ~23 sec) - not symptomatic; rare isolated PACs PVCs with rare couplets and triplets.  Symptoms noted with PVCs.  No sustained arrhythmias. Peripheral Angiography 06/02/2022: Mild abdominal aortic atherosclerosis.  Moderate to the long mid L SFA CTO with three-vessel runoff.  Short CTO of R AK Pop A -> two-vessel runoff with AT CTO => [place] 1 directional atherectomy and DCB of R Pop A  Interval History:   Elaiza Shoberg returns today for first visit with me in several years.  She is doing overall pretty well.  She is very happy to have the PA D procedures behind her.  She was very grateful to have restoration of flow in the right leg, but does not want to go through that procedure again.  She held his no fault with anyone, but 5 that she had to go back) and the surgery was very concerning for her.  Part of this is because of having to be on multiple anticoagulation medications.  Overall she is gradually getting better.  She is getting her energy level back and getting more active.  She had the flu in December and it is just taken her a while, regain her strength.  This was right on top of her PAD issues with the surgery.  Her leg is finally started to get better.  No longer having any claudication symptoms.  She is trying to get back and exercise now that she is recovered from her leg issues, but is just slow to get back into it.  She unfortunately went back to smoking, but is  down to 2 cigarettes a day.  She had quit but was somewhat stressed about back on cigarettes.  Has gradually weaned down to 2 cigarettes a day.  Hoping to  fully quit again.  She really denies any active cardiac symptoms of chest pain or pressure at rest or exertion, heart failure symptoms of PND, orthopnea or edema.  Palpitations seem to be pretty well-controlled now on beta-blocker and with reduced stress overall.  She actually has been talking to her her husband who is now on pravastatin and tolerating it pretty well.  She is willing to give that a try as an attempt to reduce her lipids.  No syncope or near CP, TIA or amaurosis fugax.  REVIEWED OF SYSTEMS   Review of Systems  Constitutional:  Negative for malaise/fatigue (Energy level Promus try to get better after dealing with the flu.) and weight loss.  HENT:  Negative for congestion and sinus pain.        No longer congested from flu.  Respiratory:  Negative for cough (Mild smoker's cough in the morning but otherwise doing well.) and shortness of breath.   Cardiovascular:        Per HPI.  No claudication.  Gastrointestinal:  Negative for abdominal pain, blood in stool, constipation, diarrhea and melena.  Genitourinary:  Negative for hematuria.  Musculoskeletal:  Negative for falls and joint pain.  Neurological:  Negative for dizziness and focal weakness.  Endo/Heme/Allergies:  Does not bruise/bleed easily.  Psychiatric/Behavioral: Negative.      I have reviewed and (if needed) personally updated the patient's problem list, medications, allergies, past medical and surgical history, social and family history.   PAST MEDICAL HISTORY   Past Medical History:  Diagnosis Date   Back pain    CAD S/P percutaneous coronary  angioplasty 09/13/2018   09/13/2018 - Cath & Staged DES PCI on 09/14/2018: DES PCI: Cx99%&55% - Resolute Onyx DES 3.5 x 30 (3.6 mm), p-mLAD (prox of D1 almost to D2) - Resolute Onyx DES 3.0 x 22 (3.3 mm); DFR on pRCA 70% - 0.99, Not significant -> Rec Med Rx.   DVT (deep venous thrombosis) ([place])    Heart attack ([place]) 09/09/2018   Hypertension    Lupus anticoagulant  syndrome (HCC)    NSTEMI (non-ST elevated myocardial infarction) ([place]) 09/12/2018   Initial presentation with chest pain was on 09/09/2018, recurrent pain on 1/26 2020 with positive troponins.  Cath showed three-vessel CAD -> declined CABG, opted for two-vessel DES PCI (LAD and CX, negative DFR RCA)   Pulmonary embolism ([place])     PAST SURGICAL HISTORY   Past Surgical History:  Procedure Laterality Date   ABDOMINAL AORTOGRAM W/LOWER EXTREMITY N/A 06/02/2022   Procedure: ABDOMINAL AORTOGRAM W/LOWER EXTREMITY;  Surgeon: Lorretta Harp, MD;  Location: [facility];  Service: Cardiovascular;  Laterality: N/A;   APPLICATION OF WOUND VAC Left 06/06/2022   Procedure: APPLICATION OF WOUND VAC LEFT GROIN;  Surgeon: Broadus John, MD;  Location: [facility];  Service: Vascular;  Laterality: Left;   ARTERY REPAIR Left 06/06/2022   Procedure: LEFT COMMON FEMORAL ARTERY REPAIR WITH MORCELLS;  Surgeon: Broadus John, MD;  Location: [facility];  Service: Vascular;  Laterality: Left;   BACK SURGERY     COLONOSCOPY  05/19/2012   Procedure: COLONOSCOPY;  Surgeon: Beryle Beams, MD;  Location: [facility];  Service: Endoscopy;  Laterality: N/A;   COLONOSCOPY WITH PROPOFOL N/A 09/30/2018   Procedure: COLONOSCOPY WITH PROPOFOL;  Surgeon: Yetta Flock, MD;  Location: [place];  Service: Gastroenterology;  Laterality: N/A;   CORONARY STENT INTERVENTION N/A 09/14/2018   Procedure: CORONARY STENT INTERVENTION;  Surgeon: Martinique, Peter M, MD;  Location: [facility];  Service: Cardiovascular: September 14, 2018- DES PCI: Cx99%&55% - Resolute Onyx DES 3.5 x 30 (3.6 mm), p-mLAD (prox of D1 almost to D2) - Resolute Onyx DES 3.0 x 22 (3.3 mm); DFR on pRCA 70% - 0.99, Not significant -> Rec Med Rx.   ESOPHAGOGASTRODUODENOSCOPY  05/19/2012   Procedure: ESOPHAGOGASTRODUODENOSCOPY (EGD);  Surgeon: Beryle Beams, MD;  Location: [facility];  Service: Endoscopy;  Laterality: N/A;   ESOPHAGOGASTRODUODENOSCOPY  (EGD) WITH PROPOFOL N/A 09/30/2018   Procedure: ESOPHAGOGASTRODUODENOSCOPY (EGD) WITH PROPOFOL;  Surgeon: Yetta Flock, MD;  Location: [place];  Service: Gastroenterology;  Laterality: N/A;   GIVENS CAPSULE STUDY N/A 09/30/2018   Procedure: GIVENS CAPSULE STUDY;  Surgeon: Yetta Flock, MD;  Location: [place];  Service: Gastroenterology;  Laterality: N/A;   HEMATOMA EVACUATION Left 06/06/2022   Procedure: EVACUATION HEMATOMA LEFT GROIN;  Surgeon: Broadus John, MD;  Location: [place];  Service: Vascular;  Laterality: Left;   HOT HEMOSTASIS N/A 09/30/2018   Procedure: HOT HEMOSTASIS (ARGON PLASMA COAGULATION/BICAP);  Surgeon: Yetta Flock, MD;  Location: [facility] ENDOSCOPY;  Service: Gastroenterology;  Laterality: N/A;   INTRAVASCULAR PRESSURE WIRE/FFR STUDY N/A 09/14/2018   Procedure: INTRAVASCULAR PRESSURE WIRE/FFR STUDY;  Surgeon: Martinique, Peter M, MD;  Location: [facility];  Service: Cardiovascular;  Laterality: RCA: DFR on pRCA 70% - 0.99, Not significant -> Rec Med Rx.   LEFT HEART CATH AND CORONARY ANGIOGRAPHY N/A 09/13/2018   Procedure: LEFT HEART CATH AND CORONARY ANGIOGRAPHY;  Surgeon: Leonie Man, MD;  Location: [facility];  Service: Cardiovascular::  CULPRIT LESION 99% p-mCx followed by 55%mCx; pLAD 35% A D71fllowed by long 80% lesion @ SP1); pRCA 70%. Normal LVEDP. Global HK EF ~45%.  -Decision on PCI deferred until discussion about PCI versus CABG.  Concern because of long-term DOAC.   PERIPHERAL VASCULAR ATHERECTOMY  06/02/2022   Procedure: PERIPHERAL VASCULAR ATHERECTOMY;  Surgeon: BLorretta Harp MD;  [facility];  Service: Cardiovascular;;   PERIPHERAL VASCULAR BALLOON ANGIOPLASTY  06/02/2022   Procedure: PERIPHERAL VASCULAR BALLOON ANGIOPLASTY;  Surgeon: BLorretta Harp MD;  [facility];  Service: Cardiovascular;;   TRANSTHORACIC ECHOCARDIOGRAM  09/13/2018   Mildly reduced EF 45 to 50% with diffuse  HK.  GR 1 DD.  Mild aortic valve calcification.  MAC.  Moderate pulmonic regurgitation.   WOUND EXPLORATION Left 06/06/2022   Procedure: LEFT GROIN EXPLORATION;  Surgeon: RBroadus John MD;  [place]  Service: Vascular;  Laterality: Left;   :Post 2 V DES PCI Jan 2020: mLCx - Resolute Onyx 3.5 x 30; LAD Resolute Onyx 3.0x22; RCA DFR 0.99 (med Rx)   Immunization History  Administered Date(s) Administered   Influenza Inj Mdck Quad Pf 05/12/2018   Influenza, High Dose Seasonal PF 06/12/2021   Influenza, Quadrivalent, Recombinant, Inj, Pf 06/12/2019   Influenza,inj,Quad PF,6+ Mos 08/25/2013, 06/21/2015, 06/04/2017   Influenza-Unspecified 05/12/2018   Moderna Sars-Covid-2 Vaccination 08/27/2019, 09/24/2019   PFIZER(Purple Top)SARS-COV-2 Vaccination 02/21/2021    MEDICATIONS/ALLERGIES   Current Meds  Medication Sig   acetaminophen (TYLENOL) 500 MG tablet Take 500 mg by mouth daily as needed for mild pain or moderate pain.   acetaminophen (TYLENOL) 650 MG CR tablet Take 650 mg by mouth daily as needed for pain.   amLODipine (NORVASC) 2.5 MG tablet Take 1 tablet (2.5 mg total) by mouth 2 (two) times daily.   bisacodyl (DULCOLAX) 5 MG EC tablet Take 5 mg by mouth daily as needed for moderate constipation.   carvedilol (COREG) 6.25 MG tablet TAKE 0.5 TABLETS (3.125 MG TOTAL) BY MOUTH 2 (TWO) TIMES DAILY WITH A MEAL.   [EXPIRED] doxycycline (VIBRA-TABS) 100 MG tablet Take 1 tablet (100 mg total) by mouth 2 (two) times daily for 7 days.   fenofibrate 160 MG tablet Take 1 tablet (160 mg total) by mouth daily.   HYDROcodone bit-homatropine (HYCODAN) 5-1.5 MG/5ML syrup Take 5 mLs by mouth at bedtime as needed for cough.   lisinopril (ZESTRIL) 40 MG tablet Take 1 tablet (40 mg total) by mouth daily.   nitroGLYCERIN (NITROSTAT) 0.4 MG SL tablet Place 1 tablet (0.4 mg total) under the tongue every 5 (five) minutes as needed for up to 3 doses for chest pain.   pravastatin (PRAVACHOL) 20 MG  tablet Take 1 tablet (20 mg total) by mouth every evening. Or as directed   [DISCONTINUED] rivaroxaban (XARELTO) 20 MG TABS tablet Take 1 tablet (20 mg total) by mouth daily with supper.    Allergies  Allergen Reactions   Prednisone Other (See Comments)    No energy "stalls out".    Makes pt  Jittery.   Tramadol Other (See Comments)    Constipation,  Makes pt  Jittery.   Crestor [Rosuvastatin Calcium]     Feels fatigued, RE  CHALLENGE - UNABLE TO USE 12/28/18   Hctz [Hydrochlorothiazide]     Dizzy    Repatha [Evolocumab] Other (See Comments)    Per patient never took this medication    SOCIAL HISTORY/FAMILY HISTORY   Reviewed in Epic:  Pertinent findings:  Social History   Tobacco Use   Smoking status: Former    Packs/day: 0.50    Years: 29.00    Total pack years: 14.50    Types: Cigarettes    Quit date: 05/2022    Years since quitting: 0.2   Smokeless tobacco: Never   Tobacco comments:    Cut back to quarter pack a day  Vaping Use   Vaping Use: Never used  Substance Use Topics   Alcohol use: Yes    Comment: Occasional   Drug use: No   Social History   Social History Narrative   Really is a married mother of 2.  1 son died from complications of spina bifida after multiple surgeries.   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     She has a has a Oceanographer in education.     Drinks social alcohol & denies drug use.     Pt endorses smoking maybe 2 cigarettes/day.  States she placed most of the cigarette, may have 2 puffs.  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   Constitutional:      General: She is not in acute distress.    Appearance: Normal appearance. She is normal weight. She is not ill-appearing (Healthy-appearing.  Well-groomed.  Well-nourished.) or toxic-appearing.  HENT:     Head: Normocephalic and atraumatic.  Neck:     Vascular: No carotid bruit (Radiated AoV murmur.) or JVD.  Cardiovascular:     Rate and Rhythm: Normal rate and regular rhythm. No extrasystoles are present.    Chest Wall: PMI is not displaced.     Pulses: Normal pulses.     Heart sounds: S1 normal and S2 normal. Murmur (1/6 SEM at RUSB.) heard.     No friction rub. No gallop.  Pulmonary:     Effort: Pulmonary effort is normal. No respiratory distress.     Breath sounds: Normal breath sounds. No wheezing, rhonchi or rales.  Musculoskeletal:        General: No swelling. Normal range of motion.     Cervical back: Normal range of motion and neck supple.  Skin:    General: Skin is warm and dry.  Neurological:     General: No focal deficit present.     Mental Status: She is alert and oriented to person, place, and time. Mental status is at baseline.     Cranial Nerves: No cranial nerve deficit.     Gait: Gait normal.  Psychiatric:        Mood and Affect: Mood normal.        Behavior: Behavior normal.        Thought Content: Thought content normal.        Judgment: Judgment normal.     Adult ECG Report  Rate: 97 ;  Rhythm: normal sinus rhythm and nonspecific ST-T wave changes.  Normal axis, intervals durations. ;   Narrative Interpretation: Stable  Recent Labs: Reviewed Lab Results  Component Value Date   CHOL 207 (H) 06/03/2022   HDL 26 (L) 06/03/2022   LDLCALC 116 (H) 06/03/2022   TRIG 327 (H) 06/03/2022   CHOLHDL 8.0 06/03/2022   Lab Results  Component Value Date   CREATININE 0.85 06/27/2022   BUN 10 06/27/2022   NA 143 06/27/2022   K 4.8 06/27/2022   CL 104 06/27/2022   CO2 23 06/27/2022      Latest Ref Rng & Units 07/18/2022   12:21 PM  06/27/2022    3:01 PM 06/09/2022    2:03 AM  CBC  WBC 3.4 - 10.8 x10E3/uL 9.4  7.2  10.0   Hemoglobin 11.1 - 15.9 g/dL 12.7  10.5  8.6   Hematocrit 34.0 - 46.6 % 39.4  31.9  26.1   Platelets 150 - 450 x10E3/uL 581  515  405     No results found for: "HGBA1C" Lab Results  Component Value Date   TSH 1.050 02/27/2022    ================================================== I spent a total of 34 minutes with the patient spent in direct patient consultation.  Additional time spent with chart review  / charting (studies, outside notes, etc): 27 min -> Extensive chart review.  I have not seen her in a year and a half. Total Time: 61 min  Current medicines are reviewed at length with the patient today.  (+/- concerns) n/a  Notice: This dictation was prepared with Dragon dictation along with smart phrase technology. Any transcriptional errors that result from this process are unintentional and may not be corrected upon review.  Studies Ordered:   Orders Placed This Encounter  Procedures   Lipid panel   Comprehensive metabolic panel   EKG 77-OEUM   Meds ordered this encounter  Medications   rivaroxaban (XARELTO) 20 MG TABS tablet    Sig: Take 1 tablet (20 mg total) by mouth daily with supper.    Dispense:  90 tablet    Refill:  3    Order Specific Question:   Lot Number?    Answer:   [id number]    Order Specific Question:   Expiration Date?    Answer:   11/16/2023   pravastatin (PRAVACHOL) 20 MG tablet    Sig: Take 1 tablet (20 mg total) by mouth every evening. Or as directed    Dispense:  30 tablet    Refill:  6    Patient Instructions / Medication Changes & Studies & Tests Ordered   Patient Instructions  Medication Instructions:    Pravastatin 20 mg  the first week  1/2 tablet  for 3 days  week, then  2nd week  take 5 days week, 3rd week  7 days  for 2 weeks then increase  20 mg  If  Symptoms increase go back 10 mg day.  *If you need a refill on your cardiac medications  before your next appointment, please call your pharmacy*   Lab Work: [place] in May 2024  If you have labs (blood work) drawn today and your tests are completely normal, you will receive your results only by: [place] (if you have MyChart) OR A paper copy in the mail If you have any lab test that is abnormal or we need to change your treatment, we will call you to review the results.   Testing/Procedures: Not needed   Follow-Up: At [facility], you and your health needs are our priority.  As part of our continuing mission to provide you with exceptional heart care, we  have created designated Provider Care Teams.  These Care Teams include your primary Cardiologist (physician) and Advanced Practice Providers (APPs -  Physician Assistants and Nurse Practitioners) who all work together to provide you with the care you need, when you need it.     Your next appointment:   6 month(s)  The format for your next appointment:   In Person  Provider:   Glenetta Hew, MD    Other Instructions      Leonie Man, MD, MS Glenetta Hew, M.D., M.S. Interventional Cardiologist  [facility]  Pager # [phone] Phone # [phone] [address] [place], [place] [address]   Thank you for choosing [facility]!!

## 2022-08-26 NOTE — Patient Instructions (Signed)
Medication Instructions:    Pravastatin 20 mg  the first week  1/2 tablet  for 3 days  week, then  2nd week  take 5 days week, 3rd week  7 days  for 2 weeks then increase  20 mg  If  Symptoms increase go back 10 mg day.  *If you need a refill on your cardiac medications before your next appointment, please call your pharmacy*   Lab Work: [facility] in May 2024  If you have labs (blood work) drawn today and your tests are completely normal, you will receive your results only by: [place] (if you have MyChart) OR A paper copy in the mail If you have any lab test that is abnormal or we need to change your treatment, we will call you to review the results.   Testing/Procedures: Not needed   Follow-Up: At [facility], you and your health needs are our priority.  As part of our continuing mission to provide you with exceptional heart care, we have created designated Provider Care Teams.  These Care Teams include your primary Cardiologist (physician) and Advanced Practice Providers (APPs -  Physician Assistants and Nurse Practitioners) who all work together to provide you with the care you need, when you need it.     Your next appointment:   6 month(s)  The format for your next appointment:   In Person  Provider:   Glenetta Hew, MD    Other Instructions

## 2022-09-03 ENCOUNTER — Encounter: Payer: Self-pay | Admitting: Cardiology

## 2022-09-03 NOTE — Assessment & Plan Note (Addendum)
Most recent lipids showed LDL of 116 which is not as bad as of expected.  Unfortunately, we still would like it to be less than 70.  Has tried simvastatin, atorvastatin, rosuvastatin all of which cause myalgias.  She had similar symptoms on Zetia and did not tolerate Repatha.  Was not interested in trying Praluent or inclisiran.  She is now willing to try low-dose pravastatin.  Perhaps if she is able to tolerate pravastatin we can potentially convince her to go forward with inclisiran evaluation.  Plan: Gently start Pravachol   Pravastatin 20 mg  the first week  1/2 tablet  for 3 days  week, then  2nd week  take 5 days week, 3rd week  7 days  for 2 weeks then increase  20 mg  -> if not tolerated, go back to 10 mg.  If it anytime she does not tolerate the next move up, then go back to the previous setting that she was able to tolerate.  Recheck lipids and chemistry panel in May

## 2022-09-03 NOTE — Assessment & Plan Note (Signed)
Surgical repair by Dr. Unk Lightning.  Seems to doing relatively well.

## 2022-09-03 NOTE — Assessment & Plan Note (Signed)
4 years out from MI, doing well.  No further angina symptoms.  She has had two-vessel PCI.  Unfortunately because of her need to be on long-term Xarelto, we are not continue to cover with Thienopyridine.  She is on monotherapy with Xarelto.  Thankfully, no active angina or heart failure symptoms.

## 2022-09-03 NOTE — Assessment & Plan Note (Signed)
BP is a little high today.  I rechecked and it was slightly lower.  Initial blood pressure was 158/80.  She tells me at home her pressure was 128/70.  She will continue to monitor at home.  For now continue current dose of amlodipine which she has not taken 2.5 mg twice daily as well as carvedilol 3.125 mg twice daily. => If pressures were tolerate, I would first increase carvedilol back to 6.25 mg twice daily because of her resting heart rate in the 90s.  (However, in the past there was concern for possible fatigue with higher dose.) Otherwise continue lisinopril 40 mg daily.  She is not on diuretic, has had issues with them in the past.

## 2022-09-03 NOTE — Assessment & Plan Note (Signed)
Doing well, no active angina or heart failure symptoms.  Plan: She is on low-dose amlodipine and carvedilol for antianginal benefit along with high-dose lisinopril for afterload reduction. Currently only on fenofibrate for lipids.  We will start pravastatin at low-dose, and gradually titrate up. Not on aspirin or Plavix because of Xarelto.

## 2022-09-03 NOTE — Assessment & Plan Note (Signed)
Long symptoms anticoagulant syndrome with history of DVT and PEs.  As such, she is on lifelong Xarelto which is actually reasonable to bridge for procedures.  Trying to avoid the use of aspirin or Plavix in conjunction with Xarelto because she has had bleeding issues.

## 2022-09-03 NOTE — Assessment & Plan Note (Signed)
She has tried multiple different statins.  Has been reluctant to try anything else-including Zetia and bempedoic acid.  She did not tolerate Repatha.  Was not interested in it).  Now that she has had issues with PAD as well as CAD, she is willing to try low-dose pravastatin.  Will follow-up closely.

## 2022-09-03 NOTE — Assessment & Plan Note (Signed)
She is really had a hard time she had done well having fully quit, but because of stress over the holidays went back to smoking.  She is down to maybe 2 or 3 a day.  Hopefully she will be able to fully.

## 2022-09-03 NOTE — Assessment & Plan Note (Signed)
Followed by Dr. Gwenlyn Found.  Thankfully, the right ABI is improved back to 0.95.  She is not having any significant left leg claudication at this point.  She is really very reluctant to think about doing any further procedures unless likely clearly she has critical limb ischemia.  For now we will continue trying to treat her risk factors.  She is actually willing to try taking pravastatin.  Continue to recommend walking as this is probably the best bet to increase collateral flow distally.

## 2022-09-06 ENCOUNTER — Encounter: Payer: Self-pay | Admitting: Family Medicine

## 2022-09-11 ENCOUNTER — Encounter: Payer: Self-pay | Admitting: Family Medicine

## 2022-09-11 ENCOUNTER — Ambulatory Visit (INDEPENDENT_AMBULATORY_CARE_PROVIDER_SITE_OTHER): Payer: Medicare Other | Admitting: Family Medicine

## 2022-09-11 VITALS — BP 144/80 | HR 95 | Temp 98.5°F | Ht 64.0 in | Wt 145.0 lb

## 2022-09-11 DIAGNOSIS — R058 Other specified cough: Secondary | ICD-10-CM | POA: Diagnosis not present

## 2022-09-11 DIAGNOSIS — R0982 Postnasal drip: Secondary | ICD-10-CM

## 2022-09-11 DIAGNOSIS — Z720 Tobacco use: Secondary | ICD-10-CM

## 2022-09-11 DIAGNOSIS — J3489 Other specified disorders of nose and nasal sinuses: Secondary | ICD-10-CM | POA: Diagnosis not present

## 2022-09-11 MED ORDER — FEXOFENADINE HCL 180 MG PO TABS: 180.0000 mg | ORAL_TABLET | Freq: Every day | ORAL | 1 refills | Status: DC

## 2022-09-11 MED ORDER — BENZONATATE 100 MG PO CAPS: 100.0000 mg | ORAL_CAPSULE | Freq: Two times a day (BID) | ORAL | 0 refills | Status: DC | PRN

## 2022-09-11 NOTE — Patient Instructions (Signed)
A prescription for allergy medicine Allegra (fexofenadine) was sent to your pharmacy.  You can take this to see if that helps stop the postnasal drainage/runny nose.  A prescription for Tessalon or benzonatate was also sent to pharmacy.  This is a prescription for cough medicine that is in pill form.

## 2022-09-11 NOTE — Progress Notes (Signed)
Established Patient Office Visit   Subjective  Patient ID: [name] [name], female    DOB: Dec 22, 1955  Age: 67 y.o. MRN: [id number]  Chief Complaint  Patient presents with   Cough    Pt is following up with cough. Going on since December. Reports the cough syrup- hydrocodone-Bit-Homat didn't help. Made her feel nauseas.     Patient is a 67 year old female with pmh sig for CAD, NSTEMI, s/p PCI, lupus anticoagulant syndrome, history of DVTs and PEs on chronic anticoagulation, HTN, GERD, history of GIB, tobacco use, HLD, statin induced myopathy, pseudoaneurysm of left femoral artery who presents for follow-up on ongoing concerns.  Patient with intermittent cough and cold symptoms since the first week of December.  Seen at [facility] given amoxicillin and albuterol inhaler for flu.  Patient then seen by Dr. Jenny Reichmann and on 08/22/2022 by this provider.  Patient notes continued cough, rhinorrhea, congestion.-Cough syrup cause nausea.  Patient denies fever, chills, headaches, ear pain/pressure.      ROS Negative unless stated above    Objective:     BP (!) 144/80 (BP Location: Left Arm, Cuff Size: Normal)   Pulse 95   Temp 98.5 F (36.9 C) (Oral)   Ht 5' 4"$  (1.626 m)   Wt 145 lb (65.8 kg)   SpO2 98%   BMI 24.89 kg/m    Physical Exam Constitutional:      General: She is not in acute distress.    Appearance: Normal appearance.  HENT:     Head: Normocephalic and atraumatic.     Ears:     Comments: Bilateral TMs full.  No effusion, erythema, or bulging.    Nose: Congestion and rhinorrhea present.     Mouth/Throat:     Mouth: Mucous membranes are moist.     Comments: Clear postnasal drainage. Cardiovascular:     Rate and Rhythm: Normal rate and regular rhythm.     Heart sounds: Normal heart sounds. No murmur heard.    No gallop.  Pulmonary:     Effort: Pulmonary effort is normal. No respiratory distress.     Breath sounds: Normal breath sounds. No wheezing, rhonchi or rales.      Comments: Productive cough. Skin:    General: Skin is warm and dry.  Neurological:     Mental Status: She is alert and oriented to person, place, and time.    No results found for any visits on 09/11/22.    Assessment & Plan:  Productive cough -     RESPIRATORY PATHOGEN PANEL -     Benzonatate; Take 1 capsule (100 mg total) by mouth 2 (two) times daily as needed for cough.  Dispense: 20 capsule; Refill: 0  Rhinorrhea -     Fexofenadine HCl; Take 1 tablet (180 mg total) by mouth daily.  Dispense: 30 tablet; Refill: 1  Post-nasal drainage -     Fexofenadine HCl; Take 1 tablet (180 mg total) by mouth daily.  Dispense: 30 tablet; Refill: 1  Tobacco use -Smoking cessation encouraged.  Patient not interested in quitting at this time.   Given continued symptoms discussed obtaining respiratory viral panel, however unable to properly collect sample in clinic.  PE less likely given stable vitals and chronic anticoagulation therapy with Xarelto.  Discussed starting antihistamine for postnasal drainage as may be contributing to cough.  Also discussed obtaining chest x-ray for continued or worsening symptoms.  No x-ray tech available in clinic.  If needed can try to place order for x-ray close  to patient's residence as she lives out of town.  Given strict precautions for continued or worsening symptoms.  Patient to contact clinic in the next few days if still not feeling better.  Return if symptoms worsen or fail to improve.   Billie Ruddy, MD

## 2022-10-04 ENCOUNTER — Other Ambulatory Visit: Payer: Self-pay | Admitting: Family Medicine

## 2022-10-04 DIAGNOSIS — J3489 Other specified disorders of nose and nasal sinuses: Secondary | ICD-10-CM

## 2022-10-04 DIAGNOSIS — R0982 Postnasal drip: Secondary | ICD-10-CM

## 2022-10-27 ENCOUNTER — Ambulatory Visit (INDEPENDENT_AMBULATORY_CARE_PROVIDER_SITE_OTHER): Payer: Medicare Other

## 2022-10-27 VITALS — Ht 64.5 in | Wt 145.0 lb

## 2022-10-27 DIAGNOSIS — Z0000 Encounter for general adult medical examination without abnormal findings: Secondary | ICD-10-CM

## 2022-10-27 NOTE — Progress Notes (Signed)
Subjective:   [name] [name] is a 67 y.o. female who presents for Medicare Annual (Subsequent) preventive examination.  Review of Systems    Virtual Visit via Telephone Note  I connected with  [name] [name] on 10/27/22 at  9:45 AM EDT by telephone and verified that I am speaking with the correct person using two identifiers.  Location: Patient: Home Provider: Office Persons participating in the virtual visit: patient/Nurse Health Advisor   I discussed the limitations, risks, security and privacy concerns of performing an evaluation and management service by telephone and the availability of in person appointments. The patient expressed understanding and agreed to proceed.  Interactive audio and video telecommunications were attempted between this nurse and patient, however failed, due to patient having technical difficulties OR patient did not have access to video capability.  We continued and completed visit with audio only.  Some vital signs may be absent or patient reported.   Criselda Peaches, LPN  Cardiac Risk Factors include: advanced age (>37mn, >>34women);smoking/ tobacco exposure;hypertension     Objective:    Today's Vitals   10/27/22 0936  Weight: 145 lb (65.8 kg)  Height: 5' 4.5" (1.638 m)   Body mass index is 24.5 kg/m.     10/27/2022    9:47 AM 06/06/2022    2:00 PM 06/02/2022   10:12 AM 02/03/2022    1:28 AM 10/31/2021    8:38 AM 05/19/2019    1:28 PM 05/16/2019    9:02 PM  Advanced Directives  Does Patient Have a Medical Advance Directive? No No No No No No No  Would patient like information on creating a medical advance directive? No - Patient declined No - Patient declined No - Patient declined  No - Patient declined No - Patient declined No - Patient declined    Current Medications (verified) Outpatient Encounter Medications as of 10/27/2022  Medication Sig   acetaminophen (TYLENOL) 500 MG tablet Take 500 mg by mouth daily as needed for  mild pain or moderate pain.   acetaminophen (TYLENOL) 650 MG CR tablet Take 650 mg by mouth daily as needed for pain.   amLODipine (NORVASC) 2.5 MG tablet Take 1 tablet (2.5 mg total) by mouth 2 (two) times daily.   benzonatate (TESSALON) 100 MG capsule Take 1 capsule (100 mg total) by mouth 2 (two) times daily as needed for cough. (Patient not taking: Reported on 10/27/2022)   bisacodyl (DULCOLAX) 5 MG EC tablet Take 5 mg by mouth daily as needed for moderate constipation.   carvedilol (COREG) 6.25 MG tablet TAKE 0.5 TABLETS (3.125 MG TOTAL) BY MOUTH 2 (TWO) TIMES DAILY WITH A MEAL.   fenofibrate 160 MG tablet Take 1 tablet (160 mg total) by mouth daily.   fexofenadine (ALLEGRA) 180 MG tablet TAKE 1 TABLET BY MOUTH EVERY DAY   HYDROcodone bit-homatropine (HYCODAN) 5-1.5 MG/5ML syrup Take 5 mLs by mouth at bedtime as needed for cough. (Patient not taking: Reported on 09/11/2022)   lisinopril (ZESTRIL) 40 MG tablet Take 1 tablet (40 mg total) by mouth daily.   nitroGLYCERIN (NITROSTAT) 0.4 MG SL tablet Place 1 tablet (0.4 mg total) under the tongue every 5 (five) minutes as needed for up to 3 doses for chest pain.   pravastatin (PRAVACHOL) 20 MG tablet Take 1 tablet (20 mg total) by mouth every evening. Or as directed   rivaroxaban (XARELTO) 20 MG TABS tablet Take 1 tablet (20 mg total) by mouth daily with supper.   No facility-administered  encounter medications on file as of 10/27/2022.    Allergies (verified) Prednisone, Tramadol, Crestor [rosuvastatin calcium], Hctz [hydrochlorothiazide], and Repatha [evolocumab]   History: Past Medical History:  Diagnosis Date   Back pain    CAD S/P percutaneous coronary angioplasty 09/13/2018   09/13/2018 - Cath & Staged DES PCI on 09/14/2018: DES PCI: Cx99%&55% - Resolute Onyx DES 3.5 x 30 (3.6 mm), p-mLAD (prox of D1 almost to D2) - Resolute Onyx DES 3.0 x 22 (3.3 mm); DFR on pRCA 70% - 0.99, Not significant -> Rec Med Rx.   DVT (deep venous thrombosis)  ([place])    Heart attack ([place]) 09/09/2018   Hypertension    Lupus anticoagulant syndrome (HCC)    NSTEMI (non-ST elevated myocardial infarction) ([place]) 09/12/2018   Initial presentation with chest pain was on 09/09/2018, recurrent pain on 1/26 2020 with positive troponins.  Cath showed three-vessel CAD -> declined CABG, opted for two-vessel DES PCI (LAD and CX, negative DFR RCA)   Pulmonary embolism ([place])    Past Surgical History:  Procedure Laterality Date   ABDOMINAL AORTOGRAM W/LOWER EXTREMITY N/A 06/02/2022   Procedure: ABDOMINAL AORTOGRAM W/LOWER EXTREMITY;  Surgeon: Lorretta Harp, MD;  Location: [facility] [facility];  Service: Cardiovascular;  Laterality: N/A;   APPLICATION OF WOUND VAC Left 06/06/2022   Procedure: APPLICATION OF WOUND VAC LEFT GROIN;  Surgeon: Broadus John, MD;  Location: [place];  Service: Vascular;  Laterality: Left;   ARTERY REPAIR Left 06/06/2022   Procedure: LEFT COMMON FEMORAL ARTERY REPAIR WITH MORCELLS;  Surgeon: Broadus John, MD;  Location: [facility];  Service: Vascular;  Laterality: Left;   BACK SURGERY     COLONOSCOPY  05/19/2012   Procedure: COLONOSCOPY;  Surgeon: Beryle Beams, MD;  Location: [place];  Service: Endoscopy;  Laterality: N/A;   COLONOSCOPY WITH PROPOFOL N/A 09/30/2018   Procedure: COLONOSCOPY WITH PROPOFOL;  Surgeon: Yetta Flock, MD;  Location: [place];  Service: Gastroenterology;  Laterality: N/A;   CORONARY STENT INTERVENTION N/A 09/14/2018   Procedure: CORONARY STENT INTERVENTION;  Surgeon: Martinique, Peter M, MD;  Location: [place] [facility];  Service: Cardiovascular: September 14, 2018- DES PCI: Cx99%&55% - Resolute Onyx DES 3.5 x 30 (3.6 mm), p-mLAD (prox of D1 almost to D2) - Resolute Onyx DES 3.0 x 22 (3.3 mm); DFR on pRCA 70% - 0.99, Not significant -> Rec Med Rx.   ESOPHAGOGASTRODUODENOSCOPY  05/19/2012   Procedure: ESOPHAGOGASTRODUODENOSCOPY (EGD);  Surgeon: Beryle Beams, MD;  Location[facility]COPY;  Service:  Endoscopy;  Laterality: N/A;   ESOPHAGOGASTRODUODENOSCOPY (EGD) WITH PROPOFOL N/A 09/30/2018   Procedure: ESOPHAGOGASTRODUODENOSCOPY (EGD) WITH PROPOFOL;  Surgeon: Yetta Flock, MD;  Location: [facility];  Service: Gastroenterology;  Laterality: N/A;   GIVENS CAPSULE STUDY N/A 09/30/2018   Procedure: GIVENS CAPSULE STUDY;  Surgeon: Yetta Flock, MD;  Location: [place];  Service: Gastroenterology;  Laterality: N/A;   HEMATOMA EVACUATION Left 06/06/2022   Procedure: EVACUATION HEMATOMA LEFT GROIN;  Surgeon: Broadus John, MD;  Location: [place];  Service: Vascular;  Laterality: Left;   HOT HEMOSTASIS N/A 09/30/2018   Procedure: HOT HEMOSTASIS (ARGON PLASMA COAGULATION/BICAP);  Surgeon: Yetta Flock, MD;  [facility];  Service: Gastroenterology;  Laterality: N/A;   INTRAVASCULAR PRESSURE WIRE/FFR STUDY N/A 09/14/2018   Procedure: INTRAVASCULAR PRESSURE WIRE/FFR STUDY;  Surgeon: Martinique, Peter M, MD;  Location: [place] [facility];  Service: Cardiovascular;  Laterality: RCA: DFR on pRCA 70% - 0.99, Not significant -> Rec  Med Rx.   LEFT HEART CATH AND CORONARY ANGIOGRAPHY N/A 09/13/2018   Procedure: LEFT HEART CATH AND CORONARY ANGIOGRAPHY;  Surgeon: Leonie Man, MD;  Location: [place] [facility];  Service: Cardiovascular:: CULPRIT LESION 99% p-mCx followed by 55%mCx; pLAD 35% A D13fllowed by long 80% lesion @ SP1); pRCA 70%. Normal LVEDP. Global HK EF ~45%.  -Decision on PCI deferred until discussion about PCI versus CABG.  Concern because of long-term DOAC.   PERIPHERAL VASCULAR ATHERECTOMY  06/02/2022   Procedure: PERIPHERAL VASCULAR ATHERECTOMY;  Surgeon: BLorretta Harp MD;  [facility];  Service: Cardiovascular;;   PERIPHERAL VASCULAR BALLOON ANGIOPLASTY  06/02/2022   Procedure: PERIPHERAL VASCULAR BALLOON ANGIOPLASTY;  Surgeon: BLorretta Harp MD;  [facility];  Service: Cardiovascular;;   TRANSTHORACIC ECHOCARDIOGRAM   09/13/2018   Mildly reduced EF 45 to 50% with diffuse HK.  GR 1 DD.  Mild aortic valve calcification.  MAC.  Moderate pulmonic regurgitation.   WOUND EXPLORATION Left 06/06/2022   Procedure: LEFT GROIN EXPLORATION;  Surgeon: RBroadus John MD;  [facility];  Service: Vascular;  Laterality: Left;   Family History  Problem Relation Age of Onset   Leukemia Mother    Prostate cancer Father    Cancer Father    Stroke Maternal Grandmother    Lung cancer Maternal Grandfather    Leukemia Paternal Grandmother    Cancer Paternal Grandfather    Spina bifida Son        Multiple surgeries   Colon cancer Neg Hx    Esophageal cancer Neg Hx    Social History   Socioeconomic History   Marital status: Married    Spouse name: Not on file   Number of children: 2   Years of education: Not on file   Highest education level: Not on file  Occupational History   Occupation: SERVICE MANAGER    Employer: [employer]  Tobacco Use   Smoking status: Every Day    Packs/day: 0.50    Years: 29.00    Total pack years: 14.50    Types: Cigarettes    Last attempt to quit: 05/2022    Years since quitting: 0.4   Smokeless tobacco: Never   Tobacco comments:    Cut back to quarter pack a day  Vaping Use   Vaping Use: Never used  Substance and Sexual Activity   Alcohol use: Yes    Comment: Occasional   Drug use: No   Sexual activity: Not on file  Other Topics Concern   Not on file  Social History Narrative   Really is a married mother of 2.  1 son died from complications of spina bifida after multiple surgeries.   She currently works as a sTherapist, nutritionalat [employer]     She has a has a mOceanographerin education.     Drinks social alcohol & denies drug use.     Pt endorses smoking maybe 2 cigarettes/day.  States she placed most of the cigarette, may have 2 puffs.   Social Determinants of Health   Financial Resource Strain: Low Risk  (10/27/2022)   Overall Financial  Resource Strain (CARDIA)    Difficulty of Paying Living Expenses: Not hard at all  Food Insecurity: No Food Insecurity (10/27/2022)   Hunger Vital Sign    Worried About Running Out of Food in the Last Year: Never true    Ran Out of Food in the  Last Year: Never true  Transportation Needs: No Transportation Needs (10/27/2022)   PRAPARE - Hydrologist (Medical): No    Lack of Transportation (Non-Medical): No  Physical Activity: Inactive (10/27/2022)   Exercise Vital Sign    Days of Exercise per Week: 0 days    Minutes of Exercise per Session: 0 min  Stress: No Stress Concern Present (10/27/2022)   [place]    Feeling of Stress : Not at all  Social Connections: [place] (10/27/2022)   Social Connection and Isolation Panel [NHANES]    Frequency of Communication with Friends and Family: More than three times a week    Frequency of Social Gatherings with Friends and Family: More than three times a week    Attends Religious Services: More than 4 times per year    Active Member of [employer] or Organizations: Yes    Attends Music therapist: More than 4 times per year    Marital Status: Married    Tobacco Counseling Ready to quit: Yes Counseling given: Yes Tobacco comments: Cut back to quarter pack a day   Clinical Intake:  Pre-visit preparation completed: Yes  Pain : No/denies pain     BMI - recorded: 24.5 Nutritional Status: BMI of 19-24  Normal Nutritional Risks: None Diabetes: No  How often do you need to have someone help you when you read instructions, pamphlets, or other written materials from your doctor or pharmacy?: 1 - Never  Diabetic?  No  Interpreter Needed?: No  Information entered by :: Rolene Arbour LPN   Activities of Daily Living    10/27/2022    9:44 AM 10/21/2022    9:35 AM  In your present state of health, do you have any difficulty  performing the following activities:  Hearing? 0 0  Vision? 0 0  Difficulty concentrating or making decisions? 0 0  Walking or climbing stairs? 1 1  Comment Followed by medical attention/ Weakness in legs   Dressing or bathing? 0 0  Doing errands, shopping? 0 0  Preparing Food and eating ? N N  Using the Toilet? N N  In the past six months, have you accidently leaked urine? N N  Do you have problems with loss of bowel control? N N  Managing your Medications? N N  Managing your Finances? N N  Housekeeping or managing your Housekeeping? N N    Patient Care Team: Billie Ruddy, MD as PCP - General (Family Medicine) Leonie Man, MD as PCP - Cardiology (Cardiology) Leonie Man, MD as Consulting Physician (Cardiology) Almyra Deforest, [place] as Physician Assistant (Cardiology)  Indicate any recent Medical Services you may have received from other than [facility] providers in the past year (date may be approximate).     Assessment:   This is a routine wellness examination for [facility].  Hearing/Vision screen Hearing Screening - Comments:: Denies hearing difficulties   Vision Screening - Comments:: Wears rx glasses - up to date with routine eye exams with  [place] issues and exercise activities discussed: Exercise limited by: orthopedic condition(s)   Goals Addressed               This Visit's Progress     Increase physical activity (pt-stated)        Would like to walk more.       Depression Screen    10/27/2022    9:43 AM  10/31/2021    8:33 AM 09/08/2018   11:56 AM 12/25/2016    9:58 AM 01/24/2016    4:31 PM 12/03/2015   10:57 AM 06/21/2015    4:40 PM  PHQ 2/9 Scores  PHQ - 2 Score 0 0 0 0 2 0 0  PHQ- 9 Score   3  3      Fall Risk    10/27/2022    9:46 AM 10/21/2022    9:35 AM 09/11/2022    3:10 PM 10/31/2021    8:36 AM 12/25/2016    9:58 AM  Fall Risk   Falls in the past year? 0 0 0 0 No  Number falls in past yr: 0 0 0 0   Injury with Fall? 0 0 0 0    Risk for fall due to : No Fall Risks  No Fall Risks No Fall Risks   Follow up Falls prevention discussed  Falls evaluation completed      FALL RISK PREVENTION PERTAINING TO THE HOME:  Any stairs in or around the home? No  If so, are there any without handrails? No  Home free of loose throw rugs in walkways, pet beds, electrical cords, etc? Yes  Adequate lighting in your home to reduce risk of falls? Yes   ASSISTIVE DEVICES UTILIZED TO PREVENT FALLS:  Life alert? No  Use of a cane, walker or w/c? No  Grab bars in the bathroom? No  Shower chair or bench in shower? No  Elevated toilet seat or a handicapped toilet? No   TIMED UP AND GO:  Was the test performed? No . Audio Visit  Cognitive Function:        10/27/2022    9:47 AM 10/31/2021    8:38 AM  6CIT Screen  What Year? 0 points 0 points  What month? 0 points 0 points  What time? 0 points 0 points  Count back from 20 0 points 0 points  Months in reverse 0 points 0 points  Repeat phrase 0 points 0 points  Total Score 0 points 0 points    Immunizations Immunization History  Administered Date(s) Administered   Influenza Inj Mdck Quad Pf 05/12/2018   Influenza, High Dose Seasonal PF 06/12/2021   Influenza, Quadrivalent, Recombinant, Inj, Pf 06/12/2019   Influenza,inj,Quad PF,6+ Mos 08/25/2013, 06/21/2015, 06/04/2017   Influenza-Unspecified 05/12/2018   Moderna Sars-Covid-2 Vaccination 08/27/2019, 09/24/2019   PFIZER(Purple Top)SARS-COV-2 Vaccination 02/21/2021    TDAP status: Due, Education has been provided regarding the importance of this vaccine. Advised may receive this vaccine at local pharmacy or Health Dept. Aware to provide a copy of the vaccination record if obtained from local pharmacy or Health Dept. Verbalized acceptance and understanding.  Flu Vaccine status: Up to date  Pneumococcal vaccine status: Due, Education has been provided regarding the importance of this vaccine. Advised may receive this  vaccine at local pharmacy or Health Dept. Aware to provide a copy of the vaccination record if obtained from local pharmacy or Health Dept. Verbalized acceptance and understanding.  Covid-19 vaccine status: Completed vaccines  Qualifies for Shingles Vaccine? Yes   Zostavax completed No   Shingrix Completed?: No.    Education has been provided regarding the importance of this vaccine. Patient has been advised to call insurance company to determine out of pocket expense if they have not yet received this vaccine. Advised may also receive vaccine at local pharmacy or Health Dept. Verbalized acceptance and understanding.  Screening Tests Health Maintenance  Topic Date Due  DTaP/Tdap/Td (1 - Tdap) Never done   Pneumonia Vaccine 62+ Years old (1 of 2 - PCV) 11/01/2022 (Originally 05/24/1962)   MAMMOGRAM  11/01/2022 (Originally 05/24/2005)   DEXA SCAN  11/01/2022 (Originally 05/24/2020)   COVID-19 Vaccine (4 - 2023-24 season) 11/12/2022 (Originally 04/18/2022)   INFLUENZA VACCINE  11/16/2022 (Originally 03/18/2022)   Zoster Vaccines- Shingrix (1 of 2) 01/27/2023 (Originally 05/24/1974)   Medicare Annual Wellness (AWV)  10/27/2023   COLONOSCOPY (Pts 45-62yr Insurance coverage will need to be confirmed)  09/30/2028   Hepatitis C Screening  Completed   HPV VACCINES  Aged Out    Health Maintenance  Health Maintenance Due  Topic Date Due   DTaP/Tdap/Td (1 - Tdap) Never done    Colorectal cancer screening: Type of screening: Colonoscopy. Completed 09/30/18. Repeat every 10 years  Mammogram status: Ordered Patient deferred. Pt provided with contact info and advised to call to schedule appt.   Bone Density status: Ordered Patient deferred. Pt provided with contact info and advised to call to schedule appt.  Lung Cancer Screening: (Low Dose CT Chest recommended if Age 68-80years, 30 pack-year currently smoking OR have quit w/in 15years.) does qualify.   Lung Cancer Screening Referral:  Deferred  Additional Screening:  Hepatitis C Screening: does qualify; Completed 12/25/16  Vision Screening: Recommended annual ophthalmology exams for early detection of glaucoma and other disorders of the eye. Is the patient up to date with their annual eye exam?  Yes  Who is the provider or what is the name of the office in which the patient attends annual eye exams? [facility] is not established with a provider, would they like to be referred to a provider to establish care? No .   Dental Screening: Recommended annual dental exams for proper oral hygiene  Community Resource Referral / Chronic Care Management:  CRR required this visit?  No   CCM required this visit?  No      Plan:     I have personally reviewed and noted the following in the patient's chart:   Medical and social history Use of alcohol, tobacco or illicit drugs  Current medications and supplements including opioid prescriptions. Patient is not currently taking opioid prescriptions. Functional ability and status Nutritional status Physical activity Advanced directives List of other physicians Hospitalizations, surgeries, and ER visits in previous 12 months Vitals Screenings to include cognitive, depression, and falls Referrals and appointments  In addition, I have reviewed and discussed with patient certain preventive protocols, quality metrics, and best practice recommendations. A written personalized care plan for preventive services as well as general preventive health recommendations were provided to patient.     BCriselda Peaches LPN   [id number]  Nurse Notes: None

## 2022-10-27 NOTE — Patient Instructions (Addendum)
[name] [name] , Thank you for taking time to come for your Medicare Wellness Visit. I appreciate your ongoing commitment to your health goals. Please review the following plan we discussed and let me know if I can assist you in the future.   These are the goals we discussed:  Goals       Increase physical activity (pt-stated)      Would like to walk more.        This is a list of the screening recommended for you and due dates:  Health Maintenance  Topic Date Due   DTaP/Tdap/Td vaccine (1 - Tdap) Never done   Pneumonia Vaccine (1 of 2 - PCV) 11/01/2022*   Mammogram  11/01/2022*   DEXA scan (bone density measurement)  11/01/2022*   COVID-19 Vaccine (4 - 2023-24 season) 11/12/2022*   Flu Shot  11/16/2022*   Zoster (Shingles) Vaccine (1 of 2) 01/27/2023*   Medicare Annual Wellness Visit  10/27/2023   Colon Cancer Screening  09/30/2028   Hepatitis C Screening: USPSTF Recommendation to screen - Ages 18-79 yo.  Completed   HPV Vaccine  Aged Out  *Topic was postponed. The date shown is not the original due date.    Advanced directives: Advance directive discussed with you today. Even though you declined this today, please call our office should you change your mind, and we can give you the proper paperwork for you to fill out.   Conditions/risks identified: None  Next appointment: Follow up in one year for your annual wellness visit     Preventive Care 65 Years and Older, Female Preventive care refers to lifestyle choices and visits with your health care provider that can promote health and wellness. What does preventive care include? A yearly physical exam. This is also called an annual well check. Dental exams once or twice a year. Routine eye exams. Ask your health care provider how often you should have your eyes checked. Personal lifestyle choices, including: Daily care of your teeth and gums. Regular physical activity. Eating a healthy diet. Avoiding tobacco and drug  use. Limiting alcohol use. Practicing safe sex. Taking low-dose aspirin every day. Taking vitamin and mineral supplements as recommended by your health care provider. What happens during an annual well check? The services and screenings done by your health care provider during your annual well check will depend on your age, overall health, lifestyle risk factors, and family history of disease. Counseling  Your health care provider may ask you questions about your: Alcohol use. Tobacco use. Drug use. Emotional well-being. Home and relationship well-being. Sexual activity. Eating habits. History of falls. Memory and ability to understand (cognition). Work and work Statistician. Reproductive health. Screening  You may have the following tests or measurements: Height, weight, and BMI. Blood pressure. Lipid and cholesterol levels. These may be checked every 5 years, or more frequently if you are over 40 years old. Skin check. Lung cancer screening. You may have this screening every year starting at age 8 if you have a 30-pack-year history of smoking and currently smoke or have quit within the past 15 years. Fecal occult blood test (FOBT) of the stool. You may have this test every year starting at age 76. Flexible sigmoidoscopy or colonoscopy. You may have a sigmoidoscopy every 5 years or a colonoscopy every 10 years starting at age 57. Hepatitis C blood test. Hepatitis B blood test. Sexually transmitted disease (STD) testing. Diabetes screening. This is done by checking your blood sugar (glucose) after you  have not eaten for a while (fasting). You may have this done every 1-3 years. Bone density scan. This is done to screen for osteoporosis. You may have this done starting at age 75. Mammogram. This may be done every 1-2 years. Talk to your health care provider about how often you should have regular mammograms. Talk with your health care provider about your test results, treatment  options, and if necessary, the need for more tests. Vaccines  Your health care provider may recommend certain vaccines, such as: Influenza vaccine. This is recommended every year. Tetanus, diphtheria, and acellular pertussis (Tdap, Td) vaccine. You may need a Td booster every 10 years. Zoster vaccine. You may need this after age 80. Pneumococcal 13-valent conjugate (PCV13) vaccine. One dose is recommended after age 42. Pneumococcal polysaccharide (PPSV23) vaccine. One dose is recommended after age 61. Talk to your health care provider about which screenings and vaccines you need and how often you need them. This information is not intended to replace advice given to you by your health care provider. Make sure you discuss any questions you have with your health care provider. Document Released: 08/31/2015 Document Revised: 04/23/2016 Document Reviewed: 06/05/2015 Elsevier Interactive Patient Education  2017 [employer] Prevention in the Home Falls can cause injuries. They can happen to people of all ages. There are many things you can do to make your home safe and to help prevent falls. What can I do on the outside of my home? Regularly fix the edges of walkways and driveways and fix any cracks. Remove anything that might make you trip as you walk through a door, such as a raised step or threshold. Trim any bushes or trees on the path to your home. Use bright outdoor lighting. Clear any walking paths of anything that might make someone trip, such as rocks or tools. Regularly check to see if handrails are loose or broken. Make sure that both sides of any steps have handrails. Any raised decks and porches should have guardrails on the edges. Have any leaves, snow, or ice cleared regularly. Use sand or salt on walking paths during winter. Clean up any spills in your garage right away. This includes oil or grease spills. What can I do in the bathroom? Use night lights. Install grab  bars by the toilet and in the tub and shower. Do not use towel bars as grab bars. Use non-skid mats or decals in the tub or shower. If you need to sit down in the shower, use a plastic, non-slip stool. Keep the floor dry. Clean up any water that spills on the floor as soon as it happens. Remove soap buildup in the tub or shower regularly. Attach bath mats securely with double-sided non-slip rug tape. Do not have throw rugs and other things on the floor that can make you trip. What can I do in the bedroom? Use night lights. Make sure that you have a light by your bed that is easy to reach. Do not use any sheets or blankets that are too big for your bed. They should not hang down onto the floor. Have a firm chair that has side arms. You can use this for support while you get dressed. Do not have throw rugs and other things on the floor that can make you trip. What can I do in the kitchen? Clean up any spills right away. Avoid walking on wet floors. Keep items that you use a lot in easy-to-reach places. If you need  to reach something above you, use a strong step stool that has a grab bar. Keep electrical cords out of the way. Do not use floor polish or wax that makes floors slippery. If you must use wax, use non-skid floor wax. Do not have throw rugs and other things on the floor that can make you trip. What can I do with my stairs? Do not leave any items on the stairs. Make sure that there are handrails on both sides of the stairs and use them. Fix handrails that are broken or loose. Make sure that handrails are as long as the stairways. Check any carpeting to make sure that it is firmly attached to the stairs. Fix any carpet that is loose or worn. Avoid having throw rugs at the top or bottom of the stairs. If you do have throw rugs, attach them to the floor with carpet tape. Make sure that you have a light switch at the top of the stairs and the bottom of the stairs. If you do not have them,  ask someone to add them for you. What else can I do to help prevent falls? Wear shoes that: Do not have high heels. Have rubber bottoms. Are comfortable and fit you well. Are closed at the toe. Do not wear sandals. If you use a stepladder: Make sure that it is fully opened. Do not climb a closed stepladder. Make sure that both sides of the stepladder are locked into place. Ask someone to hold it for you, if possible. Clearly mark and make sure that you can see: Any grab bars or handrails. First and last steps. Where the edge of each step is. Use tools that help you move around (mobility aids) if they are needed. These include: Canes. Walkers. Scooters. Crutches. Turn on the lights when you go into a dark area. Replace any light bulbs as soon as they burn out. Set up your furniture so you have a clear path. Avoid moving your furniture around. If any of your floors are uneven, fix them. If there are any pets around you, be aware of where they are. Review your medicines with your doctor. Some medicines can make you feel dizzy. This can increase your chance of falling. Ask your doctor what other things that you can do to help prevent falls. This information is not intended to replace advice given to you by your health care provider. Make sure you discuss any questions you have with your health care provider. Document Released: 05/31/2009 Document Revised: 01/10/2016 Document Reviewed: 09/08/2014 Elsevier Interactive Patient Education  2017 [employer].

## 2022-11-10 ENCOUNTER — Other Ambulatory Visit: Payer: Self-pay | Admitting: Family Medicine

## 2022-11-30 ENCOUNTER — Ambulatory Visit
Admission: RE | Admit: 2022-11-30 | Discharge: 2022-11-30 | Disposition: A | Discharge status: Home / Self Care | Payer: Medicare Other | Source: Ambulatory Visit | Attending: Nurse Practitioner | Admitting: Nurse Practitioner

## 2022-11-30 VITALS — BP 137/79 | HR 89 | Temp 98.0°F | Resp 16 | Ht 64.5 in | Wt 146.0 lb

## 2022-11-30 DIAGNOSIS — H0289 Other specified disorders of eyelid: Secondary | ICD-10-CM

## 2022-11-30 DIAGNOSIS — H02841 Edema of right upper eyelid: Secondary | ICD-10-CM

## 2022-11-30 DIAGNOSIS — H02842 Edema of right lower eyelid: Secondary | ICD-10-CM | POA: Diagnosis not present

## 2022-11-30 MED ORDER — AMOXICILLIN-POT CLAVULANATE 875-125 MG PO TABS: 1.0000 | ORAL_TABLET | Freq: Two times a day (BID) | ORAL | 0 refills | Status: AC

## 2022-11-30 MED ORDER — CLINDAMYCIN HCL 300 MG PO CAPS: 300.0000 mg | ORAL_CAPSULE | Freq: Three times a day (TID) | ORAL | 0 refills | Status: AC

## 2022-11-30 MED ORDER — OLOPATADINE HCL 0.1 % OP SOLN: 1.0000 [drp] | Freq: Two times a day (BID) | OPHTHALMIC | 0 refills | Status: DC

## 2022-11-30 NOTE — ED Triage Notes (Signed)
  Right eye redness, drainage and swelling. X2 days

## 2022-11-30 NOTE — Discharge Instructions (Addendum)
Take medication as prescribed.  Take the medication with a probiotic or with yogurt. May take over-the-counter Tylenol as needed for pain, fever, or general discomfort. Cool compresses to the right to help with swelling.  May use warm compresses to help with pain or discomfort. Strict handwashing when applying the medication to the right eye. Do not rub, scratch, or manipulate the eye while symptoms persist. If needed, may use normal saline eyedrops to help keep the eye moist and lubricated.  If you develop sudden loss of vision, worsening swelling, worsening pain of the right eye, fever, chills, or other concerns, please go to the emergency department immediately. As discussed, I would like for you to follow-up in this clinic in 24 hours for a recheck. If symptoms fail to improve with this treatment, it is recommended that you follow-up with an eye doctor for further evaluation. Follow-up as needed.

## 2022-11-30 NOTE — ED Provider Notes (Signed)
[facility]    CSN: [id number] Arrival date & time: 11/30/22  1134      History   Chief Complaint Chief Complaint  Patient presents with   Eye Problem    First felt a film over right eye then drainage and now my eye is swollen and painful when touched around the eye. Thought maybe allergies at first. - Entered by patient    HPI [name] [name] is a 67 y.o. female.   The history is provided by the patient.   The patient presents for complaints of redness, swelling, and drainage from the right eye.  Patient states symptoms started 1 day ago.  She states symptoms started with a "film" over the right eye.  Patient states that she thought it was her allergies.  She states that she went to a local pharmacy, and check with the pharmacist and it is recommended that she start Visine allergy eyedrops.  She states that she used them later in the evening, but approximately 1 hour later, she did not notice any improvement.  She states that "it actually felt a little worse".  She states this morning when she woke up, she had swelling to her upper and lower eyelids and increased tearing.  She states that the area under her eye is painful to touch and so is the upper eyelid.  She states that the drainage has been clear, but has been continuous.  She denies change in vision, loss of vision, headache, dizziness, light sensitivity, fever, chills, or recent upper respiratory symptoms.  Patient reports history of cataracts in the right eye, at baseline, she states her vision is blurry.  Patient states that she does not wear eyeglasses or contacts.  She also denies any sick contacts.  Past Medical History:  Diagnosis Date   Back pain    CAD S/P percutaneous coronary angioplasty 09/13/2018   09/13/2018 - Cath & Staged DES PCI on 09/14/2018: DES PCI: Cx99%&55% - Resolute Onyx DES 3.5 x 30 (3.6 mm), p-mLAD (prox of D1 almost to D2) - Resolute Onyx DES 3.0 x 22 (3.3 mm); DFR on pRCA 70% - 0.99, Not  significant -> Rec Med Rx.   DVT (deep venous thrombosis)    Heart attack 09/09/2018   Hypertension    Lupus anticoagulant syndrome    NSTEMI (non-ST elevated myocardial infarction) 09/12/2018   Initial presentation with chest pain was on 09/09/2018, recurrent pain on 1/26 2020 with positive troponins.  Cath showed three-vessel CAD -> declined CABG, opted for two-vessel DES PCI (LAD and CX, negative DFR RCA)   Pulmonary embolism     Patient Active Problem List   Diagnosis Date Noted   Pseudoaneurysm of left femoral artery 06/06/2022   Claudication in peripheral vascular disease 06/02/2022   Myalgia due to statin 03/25/2022   PAD (peripheral artery disease) 03/21/2022   Coronary artery disease involving native heart without angina pectoris 05/28/2021   Statin myopathy 05/10/2021   Encounter for smoking cessation counseling 04/20/2020   Accelerated hypertension 05/29/2019   Lupus anticoagulant syndrome 05/29/2019   Antiplatelet or antithrombotic long-term use    Acute on chronic anemia 09/28/2018   CAD -S/P PCI 09/24/2018   Non-ST elevation (NSTEMI) myocardial infarction    History of pulmonary embolus (PE) 09/12/2018   Microcytic anemia 01/24/2016   Hypertriglyceridemia 01/24/2016   Familial hyperlipidemia 06/21/2015   History of DVT of lower extremity    Edema of left lower extremity    Hypercoagulation syndrome 05/19/2012   Iron  deficiency 05/19/2012   Thrombocytosis 05/19/2012   Acute blood loss anemia 05/18/2012   Sinus tachycardia 04/21/2012   Hypokalemia 04/20/2012   Tobacco use 04/20/2012   Essential hypertension 04/20/2012    Past Surgical History:  Procedure Laterality Date   ABDOMINAL AORTOGRAM W/LOWER EXTREMITY N/A 06/02/2022   Procedure: ABDOMINAL AORTOGRAM W/LOWER EXTREMITY;  Surgeon: Runell Gess, MD;  Location: [facility] INVASIVE CV LAB;  Service: Cardiovascular;  Laterality: N/A;   APPLICATION OF WOUND VAC Left 06/06/2022   Procedure: APPLICATION OF WOUND VAC  LEFT GROIN;  Surgeon: Victorino Sparrow, MD;  Location: [facility] OR;  Service: Vascular;  Laterality: Left;   ARTERY REPAIR Left 06/06/2022   Procedure: LEFT COMMON FEMORAL ARTERY REPAIR WITH MORCELLS;  Surgeon: Victorino Sparrow, MD;  Location: [facility] OR;  Service: Vascular;  Laterality: Left;   BACK SURGERY     COLONOSCOPY  05/19/2012   Procedure: COLONOSCOPY;  Surgeon: Theda Belfast, MD;  Location: [facility] ENDOSCOPY;  Service: Endoscopy;  Laterality: N/A;   COLONOSCOPY WITH PROPOFOL N/A 09/30/2018   Procedure: COLONOSCOPY WITH PROPOFOL;  Surgeon: Benancio Deeds, MD;  Location: [facility] ENDOSCOPY;  Service: Gastroenterology;  Laterality: N/A;   CORONARY PRESSURE/FFR STUDY N/A 09/14/2018   Procedure: INTRAVASCULAR PRESSURE WIRE/FFR STUDY;  Surgeon: Swaziland, Peter M, MD;  Location: [facility] INVASIVE CV LAB;  Service: Cardiovascular;  Laterality: RCA: DFR on pRCA 70% - 0.99, Not significant -> Rec Med Rx.   CORONARY STENT INTERVENTION N/A 09/14/2018   Procedure: CORONARY STENT INTERVENTION;  Surgeon: Swaziland, Peter M, MD;  Location: [facility] INVASIVE CV LAB;  Service: Cardiovascular: September 14, 2018- DES PCI: Cx99%&55% - Resolute Onyx DES 3.5 x 30 (3.6 mm), p-mLAD (prox of D1 almost to D2) - Resolute Onyx DES 3.0 x 22 (3.3 mm); DFR on pRCA 70% - 0.99, Not significant -> Rec Med Rx.   ESOPHAGOGASTRODUODENOSCOPY  05/19/2012   Procedure: ESOPHAGOGASTRODUODENOSCOPY (EGD);  Surgeon: Theda Belfast, MD;  Location: [facility] ENDOSCOPY;  Service: Endoscopy;  Laterality: N/A;   ESOPHAGOGASTRODUODENOSCOPY (EGD) WITH PROPOFOL N/A 09/30/2018   Procedure: ESOPHAGOGASTRODUODENOSCOPY (EGD) WITH PROPOFOL;  Surgeon: Benancio Deeds, MD;  Location: [facility] ENDOSCOPY;  Service: Gastroenterology;  Laterality: N/A;   GIVENS CAPSULE STUDY N/A 09/30/2018   Procedure: GIVENS CAPSULE STUDY;  Surgeon: Benancio Deeds, MD;  Location: [facility] ENDOSCOPY;  Service: Gastroenterology;  Laterality: N/A;   HEMATOMA EVACUATION Left 06/06/2022   Procedure: EVACUATION HEMATOMA  LEFT GROIN;  Surgeon: Victorino Sparrow, MD;  Location: [facility] OR;  Service: Vascular;  Laterality: Left;   HOT HEMOSTASIS N/A 09/30/2018   Procedure: HOT HEMOSTASIS (ARGON PLASMA COAGULATION/BICAP);  Surgeon: Benancio Deeds, MD;  Location: [facility] ENDOSCOPY;  Service: Gastroenterology;  Laterality: N/A;   LEFT HEART CATH AND CORONARY ANGIOGRAPHY N/A 09/13/2018   Procedure: LEFT HEART CATH AND CORONARY ANGIOGRAPHY;  Surgeon: Marykay Lex, MD;  Location: [facility] INVASIVE CV LAB;  Service: Cardiovascular:: CULPRIT LESION 99% p-mCx followed by 55%mCx; pLAD 35% A D32followed by long 80% lesion @ SP1); pRCA 70%. Normal LVEDP. Global HK EF ~45%.  -Decision on PCI deferred until discussion about PCI versus CABG.  Concern because of long-term DOAC.   PERIPHERAL VASCULAR ATHERECTOMY  06/02/2022   Procedure: PERIPHERAL VASCULAR ATHERECTOMY;  Surgeon: Runell Gess, MD;  Location: [facility] INVASIVE CV LAB;  Service: Cardiovascular;;   PERIPHERAL VASCULAR BALLOON ANGIOPLASTY  06/02/2022   Procedure: PERIPHERAL VASCULAR BALLOON ANGIOPLASTY;  Surgeon: Runell Gess, MD;  Location: [facility] INVASIVE CV LAB;  Service: Cardiovascular;;   TRANSTHORACIC ECHOCARDIOGRAM  09/13/2018   Mildly reduced EF 45 to 50% with diffuse HK.  GR 1 DD.  Mild aortic valve calcification.  MAC.  Moderate pulmonic regurgitation.   WOUND EXPLORATION Left 06/06/2022   Procedure: LEFT GROIN EXPLORATION;  Surgeon: Victorino Sparrow, MD;  Location: [facility] OR;  Service: Vascular;  Laterality: Left;    OB History   No obstetric history on file.      Home Medications    Prior to Admission medications   Medication Sig Start Date End Date Taking? Authorizing Provider  acetaminophen (TYLENOL) 500 MG tablet Take 500 mg by mouth daily as needed for mild pain or moderate pain.   Yes [provider]  acetaminophen (TYLENOL) 650 MG CR tablet Take 650 mg by mouth daily as needed for pain.   Yes [provider]  amLODipine (NORVASC) 2.5 MG  tablet Take 1 tablet (2.5 mg total) by mouth 2 (two) times daily. 06/24/22  Yes Maisie Fus, MD  amoxicillin-clavulanate (AUGMENTIN) 875-125 MG tablet Take 1 tablet by mouth every 12 (twelve) hours for 5 days. 11/30/22 12/05/22 Yes Maebry Obrien-Warren, Sadie Haber, NP  bisacodyl (DULCOLAX) 5 MG EC tablet Take 5 mg by mouth daily as needed for moderate constipation.   Yes [provider]  carvedilol (COREG) 6.25 MG tablet TAKE 0.5 TABLETS (3.125 MG TOTAL) BY MOUTH 2 (TWO) TIMES DAILY WITH A MEAL. 08/12/22  Yes Marykay Lex, MD  clindamycin (CLEOCIN) 300 MG capsule Take 1 capsule (300 mg total) by mouth 3 (three) times daily for 5 days. 11/30/22 12/05/22 Yes Tiare Rohlman-Warren, Sadie Haber, NP  fexofenadine (ALLEGRA) 180 MG tablet TAKE 1 TABLET BY MOUTH EVERY DAY 10/08/22  Yes Deeann Saint, MD  lisinopril (ZESTRIL) 40 MG tablet Take 1 tablet (40 mg total) by mouth daily. 06/24/22  Yes BranchAlben Spittle, MD  nitroGLYCERIN (NITROSTAT) 0.4 MG SL tablet Place 1 tablet (0.4 mg total) under the tongue every 5 (five) minutes as needed for up to 3 doses for chest pain. 07/19/20  Yes Marykay Lex, MD  olopatadine (PATADAY) 0.1 % ophthalmic solution Place 1 drop into the right eye 2 (two) times daily. 11/30/22  Yes Pamella Samons-Warren, Sadie Haber, NP  pravastatin (PRAVACHOL) 20 MG tablet Take 1 tablet (20 mg total) by mouth every evening. Or as directed 08/26/22  Yes Marykay Lex, MD  rivaroxaban (XARELTO) 20 MG TABS tablet Take 1 tablet (20 mg total) by mouth daily with supper. 08/26/22  Yes Marykay Lex, MD  benzonatate (TESSALON) 100 MG capsule Take 1 capsule (100 mg total) by mouth 2 (two) times daily as needed for cough. Patient not taking: Reported on 10/27/2022 09/11/22   Deeann Saint, MD  fenofibrate 160 MG tablet Take 1 tablet (160 mg total) by mouth daily. 06/10/22 09/08/22  Perlie Gold, PA-C  HYDROcodone bit-homatropine (HYCODAN) 5-1.5 MG/5ML syrup Take 5 mLs by mouth at bedtime as needed for  cough. Patient not taking: Reported on 09/11/2022 08/22/22   Deeann Saint, MD    Family History Family History  Problem Relation Age of Onset   Leukemia Mother    Prostate cancer Father    Cancer Father    Stroke Maternal Grandmother    Lung cancer Maternal Grandfather    Leukemia Paternal Grandmother    Cancer Paternal Grandfather    Spina bifida Son        Multiple surgeries   Colon cancer Neg Hx    Esophageal cancer Neg Hx     Social  History Social History   Tobacco Use   Smoking status: Every Day    Packs/day: 0.50    Years: 29.00    Additional pack years: 0.00    Total pack years: 14.50    Types: Cigarettes    Last attempt to quit: 05/2022    Years since quitting: 0.5   Smokeless tobacco: Never   Tobacco comments:    Cut back to quarter pack a day  Vaping Use   Vaping Use: Never used  Substance Use Topics   Alcohol use: Yes    Comment: Occasional   Drug use: No     Allergies   Prednisone, Tramadol, Crestor [rosuvastatin calcium], Hctz [hydrochlorothiazide], and Repatha [evolocumab]   Review of Systems Review of Systems Per HPI  Physical Exam Triage Vital Signs ED Triage Vitals  Enc Vitals Group     BP 11/30/22 1154 137/79     Pulse Rate 11/30/22 1154 89     Resp 11/30/22 1154 16     Temp 11/30/22 1154 98 F (36.7 C)     Temp Source 11/30/22 1154 Oral     SpO2 11/30/22 1154 95 %     Weight 11/30/22 1149 146 lb (66.2 kg)     Height 11/30/22 1149 5' 4.5" (1.638 m)     Head Circumference --      Peak Flow --      Pain Score 11/30/22 1149 0     Pain Loc --      Pain Edu? --      Excl. in GC? --    No data found.  Updated Vital Signs BP 137/79 (BP Location: Right Arm)   Pulse 89   Temp 98 F (36.7 C) (Oral)   Resp 16   Ht 5' 4.5" (1.638 m)   Wt 146 lb (66.2 kg)   SpO2 95%   BMI 24.67 kg/m   Visual Acuity Right Eye Distance:  (could not see any letters on the chart with right eye) Left Eye Distance: 20/16 Bilateral  Distance: 20/16  Right Eye Near:   Left Eye Near:    Bilateral Near:     Physical Exam Vitals and nursing note reviewed.  Constitutional:      General: She is not in acute distress.    Appearance: Normal appearance.  HENT:     Head: Normocephalic.     Right Ear: Tympanic membrane, ear canal and external ear normal.     Left Ear: Tympanic membrane, ear canal and external ear normal.     Nose: Nose normal.     Mouth/Throat:     Mouth: Mucous membranes are moist.  Eyes:     General: Gaze aligned appropriately. No visual field deficit.       Right eye: Discharge present. No foreign body or hordeolum.        Left eye: No foreign body, discharge or hordeolum.     Extraocular Movements: Extraocular movements intact.     Right eye: Normal extraocular motion and no nystagmus.     Conjunctiva/sclera:     Right eye: Right conjunctiva is injected. No exudate or hemorrhage.    Pupils: Pupils are equal, round, and reactive to light.     Comments: Periorbital swelling with tenderness present to the right eye. Copious clear drainage noted to the right eye.   Cardiovascular:     Pulses: Normal pulses.     Heart sounds: Normal heart sounds.  Pulmonary:     Effort: Pulmonary effort is  normal.     Breath sounds: Normal breath sounds.  Abdominal:     General: Bowel sounds are normal.     Palpations: Abdomen is soft.  Musculoskeletal:     Cervical back: Normal range of motion.  Skin:    General: Skin is warm and dry.  Neurological:     General: No focal deficit present.     Mental Status: She is alert and oriented to person, place, and time.  Psychiatric:        Mood and Affect: Mood normal.        Behavior: Behavior normal.         UC Treatments / Results  Labs (all labs ordered are listed, but only abnormal results are displayed) Labs Reviewed - No data to display  EKG   Radiology No results found.  Procedures Procedures (including critical care time)  Medications  Ordered in UC Medications - No data to display  Initial Impression / Assessment and Plan / UC Course  I have reviewed the triage vital signs and the nursing notes.  Pertinent labs & imaging results that were available during my care of the patient were reviewed by me and considered in my medical decision making (see chart for details).  The patient is well-appearing, she is in no acute distress, vital signs are stable.  Patient with moderate swelling of the upper and lower lids of the right eye, with tenderness noted.  Patient has excessive drainage present.  Will treat empirically for possible preseptal/periorbital cellulitis.  Patient was started on Augmentin 875/125 mg along with clindamycin 300 mg.  Patient also was prescribed Pataday 0.1% ophthalmic solution to place into the right eye.  Supportive care recommendations were provided and discussed with the patient to include warm or cool compresses as needed, over-the-counter analgesics for pain or discomfort, and strict handwashing when applying medication.  Discussed with patient strict follow-up precautions; however, would like for the patient to follow-up in this clinic within the next 24 hours for reevaluation.  Patient is in agreement with this plan of care and verbalizes understanding.  All questions were answered.  Patient stable for discharge.  Final Clinical Impressions(s) / UC Diagnoses   Final diagnoses:  Pain and swelling of upper eyelid of right eye  Pain and swelling of lower eyelid of right eye     Discharge Instructions      Take medication as prescribed.  Take the medication with a probiotic or with yogurt. May take over-the-counter Tylenol as needed for pain, fever, or general discomfort. Cool compresses to the right to help with swelling.  May use warm compresses to help with pain or discomfort. Strict handwashing when applying the medication to the right eye. Do not rub, scratch, or manipulate the eye while  symptoms persist. If needed, may use normal saline eyedrops to help keep the eye moist and lubricated.  If you develop sudden loss of vision, worsening swelling, worsening pain of the right eye, fever, chills, or other concerns, please go to the emergency department immediately. As discussed, I would like for you to follow-up in this clinic in 24 hours for a recheck. If symptoms fail to improve with this treatment, it is recommended that you follow-up with an eye doctor for further evaluation. Follow-up as needed.     ED Prescriptions     Medication Sig Dispense Auth. Provider   clindamycin (CLEOCIN) 300 MG capsule Take 1 capsule (300 mg total) by mouth 3 (three) times daily for 5 days.  15 capsule Romualdo Prosise-Warren, Sadie Haber, NP   amoxicillin-clavulanate (AUGMENTIN) 875-125 MG tablet Take 1 tablet by mouth every 12 (twelve) hours for 5 days. 10 tablet Cordarryl Monrreal-Warren, Sadie Haber, NP   olopatadine (PATADAY) 0.1 % ophthalmic solution Place 1 drop into the right eye 2 (two) times daily. 5 mL Kellen Hover-Warren, Sadie Haber, NP      PDMP not reviewed this encounter.   Abran Cantor, NP 11/30/22 1326

## 2022-12-01 ENCOUNTER — Ambulatory Visit
Admission: EM | Admit: 2022-12-01 | Discharge: 2022-12-01 | Disposition: A | Discharge status: Home / Self Care | Payer: Medicare Other | Attending: Family Medicine | Admitting: Family Medicine

## 2022-12-01 VITALS — BP 143/80 | HR 90 | Temp 98.0°F | Resp 20

## 2022-12-01 DIAGNOSIS — H02841 Edema of right upper eyelid: Secondary | ICD-10-CM | POA: Diagnosis not present

## 2022-12-01 DIAGNOSIS — H02842 Edema of right lower eyelid: Secondary | ICD-10-CM

## 2022-12-01 DIAGNOSIS — Z09 Encounter for follow-up examination after completed treatment for conditions other than malignant neoplasm: Secondary | ICD-10-CM | POA: Diagnosis not present

## 2022-12-01 DIAGNOSIS — H0289 Other specified disorders of eyelid: Secondary | ICD-10-CM | POA: Diagnosis not present

## 2022-12-01 NOTE — ED Provider Notes (Signed)
[facility]    CSN: [id number] Arrival date & time: 12/01/22  1148      History   Chief Complaint No chief complaint on file.   HPI [name] [name] is a 67 y.o. female.   Patient presents today for eye recheck.  Reports she believes the swelling and pain has gone down quite a bit.  No new vision changes, headache, fever, body aches or chills.  There is no eye pain today.  Reports this morning, she had a little bit of crusting of her eyelids and this improves with use of warm compresses.  Patient reports she has a cataract in the right eye and has decreased visual acuity from the right eye at baseline.    Past Medical History:  Diagnosis Date   Back pain    CAD S/P percutaneous coronary angioplasty 09/13/2018   09/13/2018 - Cath & Staged DES PCI on 09/14/2018: DES PCI: Cx99%&55% - Resolute Onyx DES 3.5 x 30 (3.6 mm), p-mLAD (prox of D1 almost to D2) - Resolute Onyx DES 3.0 x 22 (3.3 mm); DFR on pRCA 70% - 0.99, Not significant -> Rec Med Rx.   DVT (deep venous thrombosis)    Heart attack 09/09/2018   Hypertension    Lupus anticoagulant syndrome    NSTEMI (non-ST elevated myocardial infarction) 09/12/2018   Initial presentation with chest pain was on 09/09/2018, recurrent pain on 1/26 2020 with positive troponins.  Cath showed three-vessel CAD -> declined CABG, opted for two-vessel DES PCI (LAD and CX, negative DFR RCA)   Pulmonary embolism     Patient Active Problem List   Diagnosis Date Noted   Pseudoaneurysm of left femoral artery 06/06/2022   Claudication in peripheral vascular disease 06/02/2022   Myalgia due to statin 03/25/2022   PAD (peripheral artery disease) 03/21/2022   Coronary artery disease involving native heart without angina pectoris 05/28/2021   Statin myopathy 05/10/2021   Encounter for smoking cessation counseling 04/20/2020   Accelerated hypertension 05/29/2019   Lupus anticoagulant syndrome 05/29/2019   Antiplatelet or antithrombotic  long-term use    Acute on chronic anemia 09/28/2018   CAD -S/P PCI 09/24/2018   Non-ST elevation (NSTEMI) myocardial infarction    History of pulmonary embolus (PE) 09/12/2018   Microcytic anemia 01/24/2016   Hypertriglyceridemia 01/24/2016   Familial hyperlipidemia 06/21/2015   History of DVT of lower extremity    Edema of left lower extremity    Hypercoagulation syndrome 05/19/2012   Iron deficiency 05/19/2012   Thrombocytosis 05/19/2012   Acute blood loss anemia 05/18/2012   Sinus tachycardia 04/21/2012   Hypokalemia 04/20/2012   Tobacco use 04/20/2012   Essential hypertension 04/20/2012    Past Surgical History:  Procedure Laterality Date   ABDOMINAL AORTOGRAM W/LOWER EXTREMITY N/A 06/02/2022   Procedure: ABDOMINAL AORTOGRAM W/LOWER EXTREMITY;  Surgeon: Runell Gess, MD;  Location: [facility] INVASIVE CV LAB;  Service: Cardiovascular;  Laterality: N/A;   APPLICATION OF WOUND VAC Left 06/06/2022   Procedure: APPLICATION OF WOUND VAC LEFT GROIN;  Surgeon: Victorino Sparrow, MD;  Location: [facility] OR;  Service: Vascular;  Laterality: Left;   ARTERY REPAIR Left 06/06/2022   Procedure: LEFT COMMON FEMORAL ARTERY REPAIR WITH MORCELLS;  Surgeon: Victorino Sparrow, MD;  Location: [facility] OR;  Service: Vascular;  Laterality: Left;   BACK SURGERY     COLONOSCOPY  05/19/2012   Procedure: COLONOSCOPY;  Surgeon: Theda Belfast, MD;  Location: [facility] ENDOSCOPY;  Service: Endoscopy;  Laterality: N/A;   COLONOSCOPY WITH  PROPOFOL N/A 09/30/2018   Procedure: COLONOSCOPY WITH PROPOFOL;  Surgeon: Benancio Deeds, MD;  Location: [facility] ENDOSCOPY;  Service: Gastroenterology;  Laterality: N/A;   CORONARY PRESSURE/FFR STUDY N/A 09/14/2018   Procedure: INTRAVASCULAR PRESSURE WIRE/FFR STUDY;  Surgeon: Swaziland, Peter M, MD;  Location: [facility] INVASIVE CV LAB;  Service: Cardiovascular;  Laterality: RCA: DFR on pRCA 70% - 0.99, Not significant -> Rec Med Rx.   CORONARY STENT INTERVENTION N/A 09/14/2018   Procedure: CORONARY STENT  INTERVENTION;  Surgeon: Swaziland, Peter M, MD;  Location: [facility] INVASIVE CV LAB;  Service: Cardiovascular: September 14, 2018- DES PCI: Cx99%&55% - Resolute Onyx DES 3.5 x 30 (3.6 mm), p-mLAD (prox of D1 almost to D2) - Resolute Onyx DES 3.0 x 22 (3.3 mm); DFR on pRCA 70% - 0.99, Not significant -> Rec Med Rx.   ESOPHAGOGASTRODUODENOSCOPY  05/19/2012   Procedure: ESOPHAGOGASTRODUODENOSCOPY (EGD);  Surgeon: Theda Belfast, MD;  Location: [facility] ENDOSCOPY;  Service: Endoscopy;  Laterality: N/A;   ESOPHAGOGASTRODUODENOSCOPY (EGD) WITH PROPOFOL N/A 09/30/2018   Procedure: ESOPHAGOGASTRODUODENOSCOPY (EGD) WITH PROPOFOL;  Surgeon: Benancio Deeds, MD;  Location: [facility] ENDOSCOPY;  Service: Gastroenterology;  Laterality: N/A;   GIVENS CAPSULE STUDY N/A 09/30/2018   Procedure: GIVENS CAPSULE STUDY;  Surgeon: Benancio Deeds, MD;  Location: [facility] ENDOSCOPY;  Service: Gastroenterology;  Laterality: N/A;   HEMATOMA EVACUATION Left 06/06/2022   Procedure: EVACUATION HEMATOMA LEFT GROIN;  Surgeon: Victorino Sparrow, MD;  Location: [facility] OR;  Service: Vascular;  Laterality: Left;   HOT HEMOSTASIS N/A 09/30/2018   Procedure: HOT HEMOSTASIS (ARGON PLASMA COAGULATION/BICAP);  Surgeon: Benancio Deeds, MD;  Location: [facility] ENDOSCOPY;  Service: Gastroenterology;  Laterality: N/A;   LEFT HEART CATH AND CORONARY ANGIOGRAPHY N/A 09/13/2018   Procedure: LEFT HEART CATH AND CORONARY ANGIOGRAPHY;  Surgeon: Marykay Lex, MD;  Location: [facility] INVASIVE CV LAB;  Service: Cardiovascular:: CULPRIT LESION 99% p-mCx followed by 55%mCx; pLAD 35% A D92followed by long 80% lesion @ SP1); pRCA 70%. Normal LVEDP. Global HK EF ~45%.  -Decision on PCI deferred until discussion about PCI versus CABG.  Concern because of long-term DOAC.   PERIPHERAL VASCULAR ATHERECTOMY  06/02/2022   Procedure: PERIPHERAL VASCULAR ATHERECTOMY;  Surgeon: Runell Gess, MD;  Location: [facility] INVASIVE CV LAB;  Service: Cardiovascular;;   PERIPHERAL VASCULAR BALLOON ANGIOPLASTY   06/02/2022   Procedure: PERIPHERAL VASCULAR BALLOON ANGIOPLASTY;  Surgeon: Runell Gess, MD;  Location: [facility] INVAS[facility] CV LAB;  Service: Cardiovascular;;   TRANSTHORACIC ECHOCARDIOGRAM  09/13/2018   Mildly reduced EF 45 to 50% with diffuse HK.  GR 1 DD.  Mild aortic valve calcification.  MAC.  Moderate pulmonic regurgitation.   WOUND EXPLORATION Left 06/06/2022   Procedure: LEFT GROIN EXPLORATION;  Surgeon: Victorino Sparrow, MD;  Location: [facility] OR;  Service: Vascular;  Laterality: Left;    OB History   No obstetric history on file.      Home Medications    Prior to Admission medications   Medication Sig Start Date End Date Taking? Authorizing Provider  acetaminophen (TYLENOL) 500 MG tablet Take 500 mg by mouth daily as needed for mild pain or moderate pain.    [provider]  acetaminophen (TYLENOL) 650 MG CR tablet Take 650 mg by mouth daily as needed for pain.    [provider]  amLODipine (NORVASC) 2.5 MG tablet Take 1 tablet (2.5 mg total) by mouth 2 (two) times daily. 06/24/22   Maisie Fus, MD  amoxicillin-clavulanate (AUGMENTIN) 875-125 MG tablet Take 1  tablet by mouth every 12 (twelve) hours for 5 days. 11/30/22 12/05/22  Leath-Warren, Sadie Haber, NP  benzonatate (TESSALON) 100 MG capsule Take 1 capsule (100 mg total) by mouth 2 (two) times daily as needed for cough. Patient not taking: Reported on 10/27/2022 09/11/22   Deeann Saint, MD  bisacodyl (DULCOLAX) 5 MG EC tablet Take 5 mg by mouth daily as needed for moderate constipation.    [provider]  carvedilol (COREG) 6.25 MG tablet TAKE 0.5 TABLETS (3.125 MG TOTAL) BY MOUTH 2 (TWO) TIMES DAILY WITH A MEAL. 08/12/22   Marykay Lex, MD  clindamycin (CLEOCIN) 300 MG capsule Take 1 capsule (300 mg total) by mouth 3 (three) times daily for 5 days. 11/30/22 12/05/22  Leath-Warren, Sadie Haber, NP  fenofibrate 160 MG tablet Take 1 tablet (160 mg total) by mouth daily. 06/10/22 09/08/22  Perlie Gold,  PA-C  fexofenadine (ALLEGRA) 180 MG tablet TAKE 1 TABLET BY MOUTH EVERY DAY 10/08/22   Deeann Saint, MD  HYDROcodone bit-homatropine (HYCODAN) 5-1.5 MG/5ML syrup Take 5 mLs by mouth at bedtime as needed for cough. Patient not taking: Reported on 09/11/2022 08/22/22   Deeann Saint, MD  lisinopril (ZESTRIL) 40 MG tablet Take 1 tablet (40 mg total) by mouth daily. 06/24/22   Maisie Fus, MD  nitroGLYCERIN (NITROSTAT) 0.4 MG SL tablet Place 1 tablet (0.4 mg total) under the tongue every 5 (five) minutes as needed for up to 3 doses for chest pain. 07/19/20   Marykay Lex, MD  olopatadine (PATADAY) 0.1 % ophthalmic solution Place 1 drop into the right eye 2 (two) times daily. 11/30/22   Leath-Warren, Sadie Haber, NP  pravastatin (PRAVACHOL) 20 MG tablet Take 1 tablet (20 mg total) by mouth every evening. Or as directed 08/26/22   Marykay Lex, MD  rivaroxaban (XARELTO) 20 MG TABS tablet Take 1 tablet (20 mg total) by mouth daily with supper. 08/26/22   Marykay Lex, MD    Family History Family History  Problem Relation Age of Onset   Leukemia Mother    Prostate cancer Father    Cancer Father    Stroke Maternal Grandmother    Lung cancer Maternal Grandfather    Leukemia Paternal Grandmother    Cancer Paternal Grandfather    Spina bifida Son        Multiple surgeries   Colon cancer Neg Hx    Esophageal cancer Neg Hx     Social History Social History   Tobacco Use   Smoking status: Every Day    Packs/day: 0.50    Years: 29.00    Additional pack years: 0.00    Total pack years: 14.50    Types: Cigarettes    Last attempt to quit: 05/2022    Years since quitting: 0.5   Smokeless tobacco: Never   Tobacco comments:    Cut back to quarter pack a day  Vaping Use   Vaping Use: Never used  Substance Use Topics   Alcohol use: Yes    Comment: Occasional   Drug use: No     Allergies   Prednisone, Tramadol, Crestor [rosuvastatin calcium], Hctz [hydrochlorothiazide], and  Repatha [evolocumab]   Review of Systems Review of Systems Per HPI  Physical Exam Triage Vital Signs ED Triage Vitals  Enc Vitals Group     BP 12/01/22 1153 (!) 143/80     Pulse Rate 12/01/22 1153 90     Resp 12/01/22 1153 20  Temp 12/01/22 1153 98 F (36.7 C)     Temp Source 12/01/22 1153 Oral     SpO2 12/01/22 1153 96 %     Weight --      Height --      Head Circumference --      Peak Flow --      Pain Score 12/01/22 1157 0     Pain Loc --      Pain Edu? --      Excl. in GC? --    No data found.  Updated Vital Signs BP (!) 143/80 (BP Location: Right Arm)   Pulse 90   Temp 98 F (36.7 C) (Oral)   Resp 20   SpO2 96%   Visual Acuity Right Eye Distance: n/a Left Eye Distance: 20/50 Bilateral Distance: 20/20  Right Eye Near:   Left Eye Near:    Bilateral Near:     Physical Exam Vitals and nursing note reviewed.  Constitutional:      General: She is not in acute distress.    Appearance: Normal appearance. She is not toxic-appearing.  HENT:     Head: Normocephalic and atraumatic.  Eyes:     General: No scleral icterus.       Right eye: No discharge.        Left eye: No discharge.     Extraocular Movements: Extraocular movements intact.     Pupils: Pupils are equal, round, and reactive to light.  Pulmonary:     Effort: Pulmonary effort is normal. No respiratory distress.  Skin:    General: Skin is warm and dry.     Capillary Refill: Capillary refill takes less than 2 seconds.     Coloration: Skin is not jaundiced or pale.     Findings: Erythema present.     Comments: Mild periorbital erythema of right eye as depicted in pictures below  Neurological:     Mental Status: She is alert and oriented to person, place, and time.  Psychiatric:        Behavior: Behavior is cooperative.          UC Treatments / Results  Labs (all labs ordered are listed, but only abnormal results are displayed) Labs Reviewed - No data to  display  EKG   Radiology No results found.  Procedures Procedures (including critical care time)  Medications Ordered in UC Medications - No data to display  Initial Impression / Assessment and Plan / UC Course  I have reviewed the triage vital signs and the nursing notes.  Pertinent labs & imaging results that were available during my care of the patient were reviewed by me and considered in my medical decision making (see chart for details).   Patient is well-appearing, normotensive, afebrile, not tachycardic, not tachypneic, oxygenating well on room air.    1. Follow-up exam after treatment 2. Pain and swelling of upper eyelid of right eye 3. Pain and swelling of lower eyelid of right eye Continue with plan of care as prescribed previously Symptoms do appear to be improving substantially Strict ER and return precautions discussed with patient  The patient was given the opportunity to ask questions.  All questions answered to their satisfaction.  The patient is in agreement to this plan.    Final Clinical Impressions(s) / UC Diagnoses   Final diagnoses:  Follow-up exam after treatment  Pain and swelling of upper eyelid of right eye  Pain and swelling of lower eyelid of right eye  Discharge Instructions      As we discussed, your eyes looking better today.  He is continue the medication as previously prescribed.  Seek care in emergency room if your symptoms suddenly worsen.     ED Prescriptions   None    PDMP not reviewed this encounter.   Valentino Nose, NP 12/01/22 1243

## 2022-12-01 NOTE — Discharge Instructions (Addendum)
As we discussed, your eyes looking better today.  He is continue the medication as previously prescribed.  Seek care in emergency room if your symptoms suddenly worsen.

## 2022-12-01 NOTE — ED Triage Notes (Signed)
Pt reports she is here to get her right eye re-evaluated for swelling and redness.

## 2022-12-29 ENCOUNTER — Other Ambulatory Visit: Payer: Self-pay | Admitting: Cardiology

## 2023-01-01 DIAGNOSIS — T161XXA Foreign body in right ear, initial encounter: Secondary | ICD-10-CM | POA: Diagnosis not present

## 2023-02-04 ENCOUNTER — Ambulatory Visit: Payer: Medicare Other | Admitting: Physician Assistant

## 2023-02-16 ENCOUNTER — Ambulatory Visit (HOSPITAL_COMMUNITY)
Admission: RE | Admit: 2023-02-16 | Discharge: 2023-02-16 | Disposition: A | Discharge status: Home / Self Care | Payer: Medicare Other | Source: Ambulatory Visit | Attending: Cardiology | Admitting: Cardiology

## 2023-02-16 DIAGNOSIS — I739 Peripheral vascular disease, unspecified: Secondary | ICD-10-CM | POA: Diagnosis not present

## 2023-02-16 DIAGNOSIS — I2510 Atherosclerotic heart disease of native coronary artery without angina pectoris: Secondary | ICD-10-CM | POA: Diagnosis not present

## 2023-02-16 DIAGNOSIS — E7849 Other hyperlipidemia: Secondary | ICD-10-CM | POA: Diagnosis not present

## 2023-02-16 DIAGNOSIS — Z9862 Peripheral vascular angioplasty status: Secondary | ICD-10-CM | POA: Insufficient documentation

## 2023-02-16 DIAGNOSIS — Z9861 Coronary angioplasty status: Secondary | ICD-10-CM | POA: Diagnosis not present

## 2023-02-16 LAB — VAS US ABI WITH/WO TBI
Left ABI: 0.81
Right ABI: 0.45

## 2023-02-17 LAB — COMPREHENSIVE METABOLIC PANEL
ALT: 10 IU/L (ref 0–32)
AST: 18 IU/L (ref 0–40)
Albumin: 4.6 g/dL (ref 3.9–4.9)
Alkaline Phosphatase: 73 IU/L (ref 44–121)
BUN/Creatinine Ratio: 10 — ABNORMAL LOW (ref 12–28)
BUN: 8 mg/dL (ref 8–27)
Bilirubin Total: 0.3 mg/dL (ref 0.0–1.2)
CO2: 24 mmol/L (ref 20–29)
Calcium: 10.2 mg/dL (ref 8.7–10.3)
Chloride: 103 mmol/L (ref 96–106)
Creatinine, Ser: 0.8 mg/dL (ref 0.57–1.00)
Globulin, Total: 2.9 g/dL (ref 1.5–4.5)
Glucose: 94 mg/dL (ref 70–99)
Potassium: 4.8 mmol/L (ref 3.5–5.2)
Sodium: 139 mmol/L (ref 134–144)
Total Protein: 7.5 g/dL (ref 6.0–8.5)
eGFR: 81 mL/min/{1.73_m2} (ref >59)

## 2023-02-17 LAB — LIPID PANEL
Chol/HDL Ratio: 6.5 ratio — ABNORMAL HIGH (ref 0.0–4.4)
Cholesterol, Total: 248 mg/dL — ABNORMAL HIGH (ref 100–199)
HDL: 38 mg/dL — ABNORMAL LOW (ref >39)
LDL Chol Calc (NIH): 143 mg/dL — ABNORMAL HIGH (ref 0–99)
Triglycerides: 362 mg/dL — ABNORMAL HIGH (ref 0–149)
VLDL Cholesterol Cal: 67 mg/dL — ABNORMAL HIGH (ref 5–40)

## 2023-02-20 ENCOUNTER — Encounter: Payer: Self-pay | Admitting: Cardiovascular Disease

## 2023-02-20 ENCOUNTER — Telehealth: Payer: Self-pay | Admitting: Pharmacist Clinician (PhC)/ Clinical Pharmacy Specialist

## 2023-02-20 ENCOUNTER — Ambulatory Visit: Payer: Medicare Other | Attending: Cardiovascular Disease | Admitting: Cardiovascular Disease

## 2023-02-20 VITALS — BP 148/70 | HR 88 | Ht 64.5 in | Wt 147.8 lb

## 2023-02-20 DIAGNOSIS — I739 Peripheral vascular disease, unspecified: Secondary | ICD-10-CM

## 2023-02-20 DIAGNOSIS — Z01818 Encounter for other preprocedural examination: Secondary | ICD-10-CM | POA: Diagnosis not present

## 2023-02-20 MED ORDER — ENOXAPARIN SODIUM 100 MG/ML IJ SOSY: 100.0000 mg | PREFILLED_SYRINGE | INTRAMUSCULAR | 0 refills | Status: DC

## 2023-02-20 NOTE — Assessment & Plan Note (Signed)
History of PAD status post right SFA directional atherectomy followed by drug-coated balloon angioplasty 06/02/2022.  She does have a known left SFA CTO which she is asymptomatic from.  After the procedure her Dopplers improved and her claudication resolved.  Over the last several weeks she has had recurrent claudication with Dopplers performed 02/16/2023 that revealed an occluded distal SFA/popliteal artery with a right ABI of 0.48.  She did have a left groin bleed with pseudoaneurysm requiring surgical intervention by Dr. Karin Lieu post procedure.  Because of her lupus anticoagulant on Xarelto she will need "a Lovenox bridge".

## 2023-02-20 NOTE — Progress Notes (Signed)
02/20/2023 [name] [name]   1955/12/26  [id number]  Primary Physician [name] Saint, MD Primary Cardiologist: [name] Gess MD [name] [name], [name] [name], [place]  HPI:  [name] [name] is a 67 y.o.  mild to moderately overweight married Caucasian female mother of 1, grandmother of 2 grandchildren referred by Azalee Course, PA-C for peripheral vascular valuation.  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  I last saw her in the office 05/23/2022.  Her risk factors include ongoing tobacco abuse 1/2 pack/day, 11/17/3.  She is accompanied by her husband [name] [name] today.  Hypertension and hyperlipidemia.  She does have CAD status post LAD and circumflex intervention back in January 2020.  She is never had a stroke.  She does have lupus anticoagulant with extensive lower extremity DVT and pulmonary emboli status post endovascular therapy with stenting of her lower extremity venous vasculature as well as placement of an IVC filter.  She was on Coumadin for some time and transition to Xarelto.  When she was on triple therapy for her LAD and circumflex stent she had GI bleeding.  Aspirin was discontinued.  She was not tolerant to Plavix.  She does complain of bilateral calf claudication over the last year with Dopplers performed 02/27/2022 revealing a right ABI of 0.57, left of 0.52.  She had high-grade lesions in her distal right SFA and popliteal artery and an occluded left SFA.   I performed peripheral angiography intervention 06/02/2022.  I performed [facility] 1 directional arthrectomy followed by [facility] of 8 short segment CTO in the mid right SFA.  She also had a mid left SFA CTO.  She was discharged home the following day and was readmitted that Friday with pain in her left groin, CT showed hematoma, Doppler showed pseudoaneurysm.  She underwent surgical evacuation and correction of the pseudoaneurysm by Dr. Sherral Hammers.  She just saw Dr. Sherral Hammers in the office last  week who noted that the incision was dry with some surrounding edema.  She does have some paresthesias down the medial aspect of her left thigh.  Her claudication has resolved and her right SFA remained widely patent by duplex ultrasound 06/12/2022.  Since I saw her in November of last year she has had recurrent claudication over the last 3 to 4 weeks with Doppler study performed 02/16/2023 revealed a decline in her right ABI to 0.48 with an occlusion of her right popliteal artery.  She wishes to proceed with outpatient peripheral angiography and potential endovascular therapy.  Because of her lupus anticoagulant, and being on Xarelto, she will need a Lovenox bridge as she did with her prior procedure.   Current Meds  Medication Sig   acetaminophen (TYLENOL) 500 MG tablet Take 500 mg by mouth daily as needed for mild pain or moderate pain.   acetaminophen (TYLENOL) 650 MG CR tablet Take 650 mg by mouth daily as needed for pain.   amLODipine (NORVASC) 2.5 MG tablet Take 1 tablet (2.5 mg total) by mouth 2 (two) times daily.   bisacodyl (DULCOLAX) 5 MG EC tablet Take 5 mg by mouth daily as needed for moderate constipation.   carvedilol (COREG) 6.25 MG tablet TAKE 0.5 TABLETS (3.125 MG TOTAL) BY MOUTH 2 (TWO) TIMES DAILY WITH A MEAL.   lisinopril (ZESTRIL) 40 MG tablet Take 1 tablet (40 mg total) by mouth daily.   nitroGLYCERIN (NITROSTAT) 0.4 MG SL tablet Place 1 tablet (0.4 mg total) under the tongue every 5 (  five) minutes as needed for up to 3 doses for chest pain.   rivaroxaban (XARELTO) 20 MG TABS tablet Take 1 tablet (20 mg total) by mouth daily with supper.     Allergies  Allergen Reactions   Prednisone Other (See Comments)    No energy "stalls out".    Makes pt  Jittery.   Tramadol Other (See Comments)    Constipation,  Makes pt  Jittery.   Crestor [Rosuvastatin Calcium]     Feels fatigued, RE  CHALLENGE - UNABLE TO USE 12/28/18   Hctz [Hydrochlorothiazide]     Dizzy    Repatha  [Evolocumab] Other (See Comments)    Per patient never took this medication    Social History   Socioeconomic History   Marital status: Married    Spouse name: Not on file   Number of children: 2   Years of education: Not on file   Highest education level: Not on file  Occupational History   Occupation: SERVICE MANAGER    Employer: [employer]  Tobacco Use   Smoking status: Every Day    Packs/day: 0.50    Years: 29.00    Additional pack years: 0.00    Total pack years: 14.50    Types: Cigarettes    Last attempt to quit: 05/2022    Years since quitting: 0.7   Smokeless tobacco: Never   Tobacco comments:    Cut back to quarter pack a day  Vaping Use   Vaping Use: Never used  Substance and Sexual Activity   Alcohol use: Yes    Comment: Occasional   Drug use: No   Sexual activity: Not on file  Other Topics Concern   Not on file  Social History Narrative   Really is a married mother of 2.  1 son died from complications of spina bifida after multiple surgeries.   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     She has a has a Scientist, water quality in education.     Drinks social alcohol & denies drug use.     Pt endorses smoking maybe 2 cigarettes/day.  States she placed most of the cigarette, may have 2 puffs.   Social Determinants of Health   Financial Resource Strain: Low Risk  (10/27/2022)   Overall Financial Resource Strain (CARDIA)    Difficulty of Paying Living Expenses: Not hard at all  Food Insecurity: No Food Insecurity (10/27/2022)   Hunger Vital Sign    Worried About Running Out of Food in the Last Year: Never true    Ran Out of Food in the Last Year: Never true  Transportation Needs: No Transportation Needs (10/27/2022)   PRAPARE - Administrator, Civil Service (Medical): No    Lack of Transportation (Non-Medical): No  Physical Activity: Inactive (10/27/2022)   Exercise Vital Sign    Days of Exercise per Week: 0 days     Minutes of Exercise per Session: 0 min  Stress: No Stress Concern Present (10/27/2022)   [employer] of Occupational Health - Occupational Stress Questionnaire    Feeling of Stress : Not at all  Social Connections: Socially Integrated (10/27/2022)   Social Connection and Isolation Panel [NHANES]    Frequency of Communication with Friends and Family: More than three times a week    Frequency of Social Gatherings with Friends and Family: More than three times a week    Attends Religious Services: More than 4 times per  year    Active Member of Clubs or Organizations: Yes    Attends Banker Meetings: More than 4 times per year    Marital Status: Married  Catering manager Violence: Not At Risk (10/27/2022)   Humiliation, Afraid, Rape, and Kick questionnaire    Fear of Current or Ex-Partner: No    Emotionally Abused: No    Physically Abused: No    Sexually Abused: No     Review of Systems: General: negative for chills, fever, night sweats or weight changes.  Cardiovascular: negative for chest pain, dyspnea on exertion, edema, orthopnea, palpitations, paroxysmal nocturnal dyspnea or shortness of breath Dermatological: negative for rash Respiratory: negative for cough or wheezing Urologic: negative for hematuria Abdominal: negative for nausea, vomiting, diarrhea, bright red blood per rectum, melena, or hematemesis Neurologic: negative for visual changes, syncope, or dizziness All other systems reviewed and are otherwise negative except as noted above.    Blood pressure (!) 148/70, pulse 88, height 5' 4.5" (1.638 m), weight 147 lb 12.8 oz (67 kg), SpO2 99 %.  General appearance: alert and no distress Neck: no adenopathy, no carotid bruit, no JVD, supple, symmetrical, trachea midline, and thyroid not enlarged, symmetric, no tenderness/mass/nodules Lungs: clear to auscultation bilaterally Heart: regular rate and rhythm, S1, S2 normal, no murmur, click, rub or  gallop Extremities: extremities normal, atraumatic, no cyanosis or edema Pulses: Decreased pedal pulses Skin: Skin color, texture, turgor normal. No rashes or lesions Neurologic: Grossly normal  EKG not performed today      ASSESSMENT AND PLAN:   PAD (peripheral artery disease) (HCC) History of PAD status post right SFA directional atherectomy followed by drug-coated balloon angioplasty 06/02/2022.  She does have a known left SFA CTO which she is asymptomatic from.  After the procedure her Dopplers improved and her claudication resolved.  Over the last several weeks she has had recurrent claudication with Dopplers performed 02/16/2023 that revealed an occluded distal SFA/popliteal artery with a right ABI of 0.48.  She did have a left groin bleed with pseudoaneurysm requiring surgical intervention by Dr. Karin Lieu post procedure.  Because of her lupus anticoagulant on Xarelto she will need "a Lovenox bridge".     [name] Gess MD FACP,FACC,FAHA, [facility] 02/20/2023 11:25 AM

## 2023-02-20 NOTE — Telephone Encounter (Signed)
Patient in to see Dr. Allyson Sabal, scheduled for PAD procedure on 8/1, has history of lupus anticoagulant and DVT, will need bridging per Dr. Allyson Sabal.  7/28: Last dose of Xarelto    7/29: Inject enoxaparin 100mg   in the fatty tissue in the evening; no Xarelto   7/30: Inject enoxaparin 100mg  in the fatty tissue in the evening; no Xarelto   7/31: No enoxaparin,  no Xarelto   8/1: Procedure Day - No enoxaparin - Resume Xarelto in the evening or as directed by physician

## 2023-02-20 NOTE — Patient Instructions (Addendum)
Medication Instructions:   *If you need a refill on your cardiac medications before your next appointment, please call your pharmacy*  Bridging instructions:  7/28: Last dose of Xarelto    7/29: Inject enoxaparin 100mg   in the fatty tissue in the evening; no Xarelto   7/30: Inject enoxaparin 100mg  in the fatty tissue in the evening; no Xarelto   7/31: No enoxaparin,  no Xarelto   8/1: Procedure Day - No enoxaparin - Resume Xarelto in the evening or as directed by physician   Lab Work: Your physician recommends that you have labs drawn today: CBC  If you have labs (blood work) drawn today and your tests are completely normal, you will receive your results only by: MyChart Message (if you have MyChart) OR A paper copy in the mail If you have any lab test that is abnormal or we need to change your treatment, we will call you to review the results.   Testing/Procedures: Your physician has requested that you have a lower extremity arterial duplex. During this test, ultrasound is used to evaluate arterial blood flow in the legs. Allow one hour for this exam. There are no restrictions or special instructions. This will take place at [address] [facility], [address]. To be done 1-2 weeks after your procedure (8/1)  Your physician has requested that you have an ankle brachial index (ABI). During this test an ultrasound and blood pressure cuff are used to evaluate the arteries that supply the arms and legs with blood. Allow thirty minutes for this exam. There are no restrictions or special instructions. This will take place at [address] [facility], [address]. To be done 1-2 weeks after your procedure (8/1)     Follow-Up: At [facility], you and your health needs are our priority.  As part of our continuing mission to provide you with exceptional heart care, we have created designated Provider Care Teams.  These Care Teams include your primary Cardiologist (physician) and Advanced  Practice Providers (APPs -  Physician Assistants and Nurse Practitioners) who all work together to provide you with the care you need, when you need it.  We recommend signing up for the patient portal called "MyChart".  Sign up information is provided on this After Visit Summary.  MyChart is used to connect with patients for Virtual Visits (Telemedicine).  Patients are able to view lab/test results, encounter notes, upcoming appointments, etc.  Non-urgent messages can be sent to your provider as well.   To learn more about what you can do with MyChart, go to [email or web address].    Your next appointment:   2-3 week(s)  Provider:   Nanetta Batty, MD    Other Instructions       Cardiac/Peripheral Catheterization   You are scheduled for a Peripheral Angiogram on Thursday, August 1 with Dr. Nanetta Batty.  1. Please arrive at the [facility] (Main Entrance A) at [facility]: [address] [place], [place] [address] at 9:30 AM (This time is 2 hour(s) before your procedure to ensure your preparation). Free valet parking service is available. You will check in at ADMITTING. The support person will be asked to wait in the waiting room.  It is OK to have someone drop you off and come back when you are ready to be discharged.        Special note: Every effort is made to have your procedure done on time. Please understand that emergencies sometimes delay scheduled procedures.  2. Diet: Do  not eat solid foods after midnight.  You may have clear liquids until 5 AM the day of the procedure.  3. Labs: You will need to have blood drawn today (7/5)  4. Medication instructions in preparation for your procedure:    On the morning of your procedure, take Aspirin 81 mg and any morning medicines NOT listed above.  You may use sips of water.  5. Plan to go home the same day, you will only stay overnight if medically necessary. 6. You MUST have a responsible adult to drive you home. 7. An  adult MUST be with you the first 24 hours after you arrive home. 8. Bring a current list of your medications, and the last time and date medication taken. 9. Bring ID and current insurance cards. 10.Please wear clothes that are easy to get on and off and wear slip-on shoes.  Thank you for allowing [place] to care for you!   -- [facility]

## 2023-02-20 NOTE — H&P (View-Only) (Signed)
02/20/2023 [name] [name]   1955/12/26  [id number]  Primary Physician Deeann Saint, MD Primary Cardiologist: Runell Gess MD Milagros Loll, [place], [place]  HPI:  [name] [name] is a 67 y.o.  mild to moderately overweight married Caucasian female mother of 1, grandmother of 2 grandchildren referred by Azalee Course, PA-C for peripheral vascular valuation.  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  I last saw her in the office 05/23/2022.  Her risk factors include ongoing tobacco abuse 1/2 pack/day, 11/17/3.  She is accompanied by her husband Thayer Ohm today.  Hypertension and hyperlipidemia.  She does have CAD status post LAD and circumflex intervention back in January 2020.  She is never had a stroke.  She does have lupus anticoagulant with extensive lower extremity DVT and pulmonary emboli status post endovascular therapy with stenting of her lower extremity venous vasculature as well as placement of an IVC filter.  She was on Coumadin for some time and transition to Xarelto.  When she was on triple therapy for her LAD and circumflex stent she had GI bleeding.  Aspirin was discontinued.  She was not tolerant to Plavix.  She does complain of bilateral calf claudication over the last year with Dopplers performed 02/27/2022 revealing a right ABI of 0.57, left of 0.52.  She had high-grade lesions in her distal right SFA and popliteal artery and an occluded left SFA.   I performed peripheral angiography intervention 06/02/2022.  I performed [facility] 1 directional arthrectomy followed by [facility] of 8 short segment CTO in the mid right SFA.  She also had a mid left SFA CTO.  She was discharged home the following day and was readmitted that Friday with pain in her left groin, CT showed hematoma, Doppler showed pseudoaneurysm.  She underwent surgical evacuation and correction of the pseudoaneurysm by Dr. Sherral Hammers.  She just saw Dr. Sherral Hammers in the office last  week who noted that the incision was dry with some surrounding edema.  She does have some paresthesias down the medial aspect of her left thigh.  Her claudication has resolved and her right SFA remained widely patent by duplex ultrasound 06/12/2022.  Since I saw her in November of last year she has had recurrent claudication over the last 3 to 4 weeks with Doppler study performed 02/16/2023 revealed a decline in her right ABI to 0.48 with an occlusion of her right popliteal artery.  She wishes to proceed with outpatient peripheral angiography and potential endovascular therapy.  Because of her lupus anticoagulant, and being on Xarelto, she will need a Lovenox bridge as she did with her prior procedure.   Current Meds  Medication Sig   acetaminophen (TYLENOL) 500 MG tablet Take 500 mg by mouth daily as needed for mild pain or moderate pain.   acetaminophen (TYLENOL) 650 MG CR tablet Take 650 mg by mouth daily as needed for pain.   amLODipine (NORVASC) 2.5 MG tablet Take 1 tablet (2.5 mg total) by mouth 2 (two) times daily.   bisacodyl (DULCOLAX) 5 MG EC tablet Take 5 mg by mouth daily as needed for moderate constipation.   carvedilol (COREG) 6.25 MG tablet TAKE 0.5 TABLETS (3.125 MG TOTAL) BY MOUTH 2 (TWO) TIMES DAILY WITH A MEAL.   lisinopril (ZESTRIL) 40 MG tablet Take 1 tablet (40 mg total) by mouth daily.   nitroGLYCERIN (NITROSTAT) 0.4 MG SL tablet Place 1 tablet (0.4 mg total) under the tongue every 5 (  five) minutes as needed for up to 3 doses for chest pain.   rivaroxaban (XARELTO) 20 MG TABS tablet Take 1 tablet (20 mg total) by mouth daily with supper.     Allergies  Allergen Reactions   Prednisone Other (See Comments)    No energy "stalls out".    Makes pt  Jittery.   Tramadol Other (See Comments)    Constipation,  Makes pt  Jittery.   Crestor [Rosuvastatin Calcium]     Feels fatigued, RE  CHALLENGE - UNABLE TO USE 12/28/18   Hctz [Hydrochlorothiazide]     Dizzy    Repatha  [Evolocumab] Other (See Comments)    Per patient never took this medication    Social History   Socioeconomic History   Marital status: Married    Spouse name: Not on file   Number of children: 2   Years of education: Not on file   Highest education level: Not on file  Occupational History   Occupation: SERVICE MANAGER    Employer: [employer]  Tobacco Use   Smoking status: Every Day    Packs/day: 0.50    Years: 29.00    Additional pack years: 0.00    Total pack years: 14.50    Types: Cigarettes    Last attempt to quit: 05/2022    Years since quitting: 0.7   Smokeless tobacco: Never   Tobacco comments:    Cut back to quarter pack a day  Vaping Use   Vaping Use: Never used  Substance and Sexual Activity   Alcohol use: Yes    Comment: Occasional   Drug use: No   Sexual activity: Not on file  Other Topics Concern   Not on file  Social History Narrative   Really is a married mother of 2.  1 son died from complications of spina bifida after multiple surgeries.   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     She has a has a Scientist, water quality in education.     Drinks social alcohol & denies drug use.     Pt endorses smoking maybe 2 cigarettes/day.  States she placed most of the cigarette, may have 2 puffs.   Social Determinants of Health   Financial Resource Strain: Low Risk  (10/27/2022)   Overall Financial Resource Strain (CARDIA)    Difficulty of Paying Living Expenses: Not hard at all  Food Insecurity: No Food Insecurity (10/27/2022)   Hunger Vital Sign    Worried About Running Out of Food in the Last Year: Never true    Ran Out of Food in the Last Year: Never true  Transportation Needs: No Transportation Needs (10/27/2022)   PRAPARE - Administrator, Civil Service (Medical): No    Lack of Transportation (Non-Medical): No  Physical Activity: Inactive (10/27/2022)   Exercise Vital Sign    Days of Exercise per Week: 0 days     Minutes of Exercise per Session: 0 min  Stress: No Stress Concern Present (10/27/2022)   [employer] of Occupational Health - Occupational Stress Questionnaire    Feeling of Stress : Not at all  Social Connections: Socially Integrated (10/27/2022)   Social Connection and Isolation Panel [NHANES]    Frequency of Communication with Friends and Family: More than three times a week    Frequency of Social Gatherings with Friends and Family: More than three times a week    Attends Religious Services: More than 4 times per  year    Active Member of Clubs or Organizations: Yes    Attends Banker Meetings: More than 4 times per year    Marital Status: Married  Catering manager Violence: Not At Risk (10/27/2022)   Humiliation, Afraid, Rape, and Kick questionnaire    Fear of Current or Ex-Partner: No    Emotionally Abused: No    Physically Abused: No    Sexually Abused: No     Review of Systems: General: negative for chills, fever, night sweats or weight changes.  Cardiovascular: negative for chest pain, dyspnea on exertion, edema, orthopnea, palpitations, paroxysmal nocturnal dyspnea or shortness of breath Dermatological: negative for rash Respiratory: negative for cough or wheezing Urologic: negative for hematuria Abdominal: negative for nausea, vomiting, diarrhea, bright red blood per rectum, melena, or hematemesis Neurologic: negative for visual changes, syncope, or dizziness All other systems reviewed and are otherwise negative except as noted above.    Blood pressure (!) 148/70, pulse 88, height 5' 4.5" (1.638 m), weight 147 lb 12.8 oz (67 kg), SpO2 99 %.  General appearance: alert and no distress Neck: no adenopathy, no carotid bruit, no JVD, supple, symmetrical, trachea midline, and thyroid not enlarged, symmetric, no tenderness/mass/nodules Lungs: clear to auscultation bilaterally Heart: regular rate and rhythm, S1, S2 normal, no murmur, click, rub or  gallop Extremities: extremities normal, atraumatic, no cyanosis or edema Pulses: Decreased pedal pulses Skin: Skin color, texture, turgor normal. No rashes or lesions Neurologic: Grossly normal  EKG not performed today      ASSESSMENT AND PLAN:   PAD (peripheral artery disease) (HCC) History of PAD status post right SFA directional atherectomy followed by drug-coated balloon angioplasty 06/02/2022.  She does have a known left SFA CTO which she is asymptomatic from.  After the procedure her Dopplers improved and her claudication resolved.  Over the last several weeks she has had recurrent claudication with Dopplers performed 02/16/2023 that revealed an occluded distal SFA/popliteal artery with a right ABI of 0.48.  She did have a left groin bleed with pseudoaneurysm requiring surgical intervention by Dr. Karin Lieu post procedure.  Because of her lupus anticoagulant on Xarelto she will need "a Lovenox bridge".     Runell Gess MD FACP,FACC,FAHA, [facility] 02/20/2023 11:25 AM

## 2023-02-21 LAB — CBC
Hematocrit: 37.7 % (ref 34.0–46.6)
Hemoglobin: 12.5 g/dL (ref 11.1–15.9)
MCH: 29.2 pg (ref 26.6–33.0)
MCHC: 33.2 g/dL (ref 31.5–35.7)
MCV: 88 fL (ref 79–97)
Platelets: 492 10*3/uL — ABNORMAL HIGH (ref 150–450)
RBC: 4.28 x10E6/uL (ref 3.77–5.28)
RDW: 12.5 % (ref 11.7–15.4)
WBC: 10.8 10*3/uL (ref 3.4–10.8)

## 2023-02-24 ENCOUNTER — Other Ambulatory Visit: Payer: Self-pay

## 2023-02-24 DIAGNOSIS — I739 Peripheral vascular disease, unspecified: Secondary | ICD-10-CM

## 2023-02-27 ENCOUNTER — Other Ambulatory Visit: Payer: Self-pay | Admitting: Cardiology

## 2023-02-27 ENCOUNTER — Ambulatory Visit: Payer: Medicare Other | Admitting: Physician Assistant

## 2023-03-16 ENCOUNTER — Ambulatory Visit (INDEPENDENT_AMBULATORY_CARE_PROVIDER_SITE_OTHER): Payer: Medicare Other | Admitting: Family Medicine

## 2023-03-16 ENCOUNTER — Encounter: Payer: Self-pay | Admitting: Family Medicine

## 2023-03-16 VITALS — BP 134/72 | HR 74 | Temp 98.8°F | Wt 148.8 lb

## 2023-03-16 DIAGNOSIS — R102 Pelvic and perineal pain: Secondary | ICD-10-CM | POA: Diagnosis not present

## 2023-03-16 DIAGNOSIS — R309 Painful micturition, unspecified: Secondary | ICD-10-CM | POA: Diagnosis not present

## 2023-03-16 DIAGNOSIS — R3989 Other symptoms and signs involving the genitourinary system: Secondary | ICD-10-CM

## 2023-03-16 LAB — POC URINALSYSI DIPSTICK (AUTOMATED)
Bilirubin, UA: NEGATIVE
Glucose, UA: NEGATIVE
Ketones, UA: NEGATIVE
Nitrite, UA: NEGATIVE
Protein, UA: POSITIVE — AB
Spec Grav, UA: 1.02 (ref 1.010–1.025)
Urobilinogen, UA: NEGATIVE E.U./dL — AB
pH, UA: 6 (ref 5.0–8.0)

## 2023-03-16 MED ORDER — AMOXICILLIN 500 MG PO TABS
500.0000 mg | ORAL_TABLET | Freq: Two times a day (BID) | ORAL | 0 refills | Status: AC
Start: 2023-03-16 — End: 2023-03-23

## 2023-03-16 NOTE — Progress Notes (Signed)
Established Patient Office Visit   Subjective  Patient ID: [name] [name], female    DOB: [birth year]  Age: 67 y.o. MRN: [id number]  Chief Complaint  Patient presents with   Urinary Tract Infection    Started 3 days ago, no burning, usually has clear urine. Had a pressure at the end of urinating.     Patient is a 44 female seen for acute concern.  Patient with discomfort/mild burn at the end of urinary stream and pressure x 3 days.  Unsure about frequency as increasing intake of water.  Patient denies nausea, vomiting, fever, chills, back pain, hematuria, abdominal pain.  May have been holding urine while at work prior to start of symptoms.  Patient scheduled to have stent placed in vein of right leg on Thursday.  Patient states right leg is discolored, feels tingly and numb due to decreased blood flow.  On Xarelto due to history of NSTEMI and CAD with two-vessel DES.  Stopping for surgery  Urinary Tract Infection     Past Medical History:  Diagnosis Date   Back pain    CAD S/P percutaneous coronary angioplasty 09/13/2018   09/13/2018 - Cath & Staged DES PCI on 09/14/2018: DES PCI: Cx99%&55% - Resolute Onyx DES 3.5 x 30 (3.6 mm), p-mLAD (prox of D1 almost to D2) - Resolute Onyx DES 3.0 x 22 (3.3 mm); DFR on pRCA 70% - 0.99, Not significant -> Rec Med Rx.   DVT (deep venous thrombosis) (HCC)    Heart attack (HCC) 09/09/2018   Hypertension    Lupus anticoagulant syndrome (HCC)    NSTEMI (non-ST elevated myocardial infarction) (HCC) 09/12/2018   Initial presentation with chest pain was on 09/09/2018, recurrent pain on 1/26 2020 with positive troponins.  Cath showed three-vessel CAD -> declined CABG, opted for two-vessel DES PCI (LAD and CX, negative DFR RCA)   Pulmonary embolism (HCC)    Past Surgical History:  Procedure Laterality Date   ABDOMINAL AORTOGRAM W/LOWER EXTREMITY N/A 06/02/2022   Procedure: ABDOMINAL AORTOGRAM W/LOWER EXTREMITY;  Surgeon: Runell Gess, MD;  [facility];  Service: Cardiovascular;  Laterality: N/A;   APPLICATION OF WOUND VAC Left 06/06/2022   Procedure: APPLICATION OF WOUND VAC LEFT GROIN;  Surgeon: Victorino Sparrow, MD;  Location: [facility] OR;  Service: Vascular;  Laterality: Left;   ARTERY REPAIR Left 06/06/2022   Procedure: LEFT COMMON FEMORAL ARTERY REPAIR WITH MORCELLS;  Surgeon: Victorino Sparrow, MD;  Location: [facility] OR;  Service: Vascular;  Laterality: Left;   BACK SURGERY     COLONOSCOPY  05/19/2012   Procedure: COLONOSCOPY;  Surgeon: Theda Belfast, MD;  Location: [facility] ENDOSCOPY;  Service: Endoscopy;  Laterality: N/A;   COLONOSCOPY WITH PROPOFOL N/A 09/30/2018   Procedure: COLONOSCOPY WITH PROPOFOL;  Surgeon: Benancio Deeds, MD;  Location: [facility] ENDOSCOPY;  Service: Gastroenterology;  Laterality: N/A;   CORONARY PRESSURE/FFR STUDY N/A 09/14/2018   Procedure: INTRAVASCULAR PRESSURE WIRE/FFR STUDY;  Surgeon: Swaziland, Peter M, MD;  Location: [facility] INVASIVE CV LAB;  Service: Cardiovascular;  Laterality: RCA: DFR on pRCA 70% - 0.99, Not significant -> Rec Med Rx.   CORONARY STENT INTERVENTION N/A 09/14/2018   Procedure: CORONARY STENT INTERVENTION;  Surgeon: Swaziland, Peter M, MD;  Location: [facility] INVASIVE CV LAB;  Service: Cardiovascular: September 14, 2018- DES PCI: Cx99%&55% - Resolute Onyx DES 3.5 x 30 (3.6 mm), p-mLAD (prox of D1 almost to D2) - Resolute Onyx DES 3.0 x 22 (3.3 mm); DFR on pRCA 70% -  0.99, Not significant -> Rec Med Rx.   ESOPHAGOGASTRODUODENOSCOPY  05/19/2012   Procedure: ESOPHAGOGASTRODUODENOSCOPY (EGD);  Surgeon: Theda Belfast, MD;  Location: [facility] ENDOSCOPY;  Service: Endoscopy;  Laterality: N/A;   ESOPHAGOGASTRODUODENOSCOPY (EGD) WITH PROPOFOL N/A 09/30/2018   Procedure: ESOPHAGOGASTRODUODENOSCOPY (EGD) WITH PROPOFOL;  Surgeon: Benancio Deeds, MD;  Location: [facility] ENDOSCOPY;  Service: Gastroenterology;  Laterality: N/A;   GIVENS CAPSULE STUDY N/A 09/30/2018   Procedure: GIVENS CAPSULE STUDY;  Surgeon: Benancio Deeds,  MD;  Location: [facility] ENDOSCOPY;  Service: Gastroenterology;  Laterality: N/A;   HEMATOMA EVACUATION Left 06/06/2022   Procedure: EVACUATION HEMATOMA LEFT GROIN;  Surgeon: Victorino Sparrow, MD;  Location: [facility] OR;  Service: Vascular;  Laterality: Left;   HOT HEMOSTASIS N/A 09/30/2018   Procedure: HOT HEMOSTASIS (ARGON PLASMA COAGULATION/BICAP);  Surgeon: Benancio Deeds, MD;  Location: [facility] ENDOSCOPY;  Service: Gastroenterology;  Laterality: N/A;   LEFT HEART CATH AND CORONARY ANGIOGRAPHY N/A 09/13/2018   Procedure: LEFT HEART CATH AND CORONARY ANGIOGRAPHY;  Surgeon: Marykay Lex, MD;  Location: [facility] INVASIVE CV LAB;  Service: Cardiovascular:: CULPRIT LESION 99% p-mCx followed by 55%mCx; pLAD 35% A D77followed by long 80% lesion @ SP1); pRCA 70%. Normal LVEDP. Global HK EF ~45%.  -Decision on PCI deferred until discussion about PCI versus CABG.  Concern because of long-term DOAC.   PERIPHERAL VASCULAR ATHERECTOMY  06/02/2022   Procedure: PERIPHERAL VASCULAR ATHERECTOMY;  Surgeon: Runell Gess, MD;  Location: [facility] INVASIVE CV LAB;  Service: Cardiovascular;;   PERIPHERAL VASCULAR BALLOON ANGIOPLASTY  06/02/2022   Procedure: PERIPHERAL VASCULAR BALLOON ANGIOPLASTY;  Surgeon: Runell Gess, MD;  Location: [facility];  Service: Cardiovascular;;   TRANSTHORACIC ECHOCARDIOGRAM  09/13/2018   Mildly reduced EF 45 to 50% with diffuse HK.  GR 1 DD.  Mild aortic valve calcification.  MAC.  Moderate pulmonic regurgitation.   WOUND EXPLORATION Left 06/06/2022   Procedure: LEFT GROIN EXPLORATION;  Surgeon: Victorino Sparrow, MD;  Location: [facility] OR;  Service: Vascular;  Laterality: Left;   Social History   Tobacco Use   Smoking status: Every Day    Current packs/day: 0.00    Average packs/day: 0.5 packs/day for 29.0 years (14.5 ttl pk-yrs)    Types: Cigarettes    Start date: 05/1993    Last attempt to quit: 05/2022    Years since quitting: 0.8   Smokeless tobacco: Never   Tobacco comments:    Cut  back to quarter pack a day  Vaping Use   Vaping status: Never Used  Substance Use Topics   Alcohol use: Yes    Comment: Occasional   Drug use: No   Family History  Problem Relation Age of Onset   Leukemia Mother    Prostate cancer Father    Cancer Father    Stroke Maternal Grandmother    Lung cancer Maternal Grandfather    Leukemia Paternal Grandmother    Cancer Paternal Grandfather    Spina bifida Son        Multiple surgeries   Colon cancer Neg Hx    Esophageal cancer Neg Hx    Allergies  Allergen Reactions   Prednisone Other (See Comments)    No energy "stalls out".    Makes pt  Jittery.   Tramadol Other (See Comments)    Constipation,  Makes pt  Jittery.   Crestor [Rosuvastatin Calcium]     Feels fatigued, RE  CHALLENGE - UNABLE TO USE 12/28/18   Hctz [Hydrochlorothiazide]  Dizzy    Repatha [Evolocumab] Other (See Comments)    Per patient never took this medication      ROS Negative unless stated above    Objective:     BP 134/72 (BP Location: Left Arm, Patient Position: Sitting, Cuff Size: Normal)   Pulse 74   Temp 98.8 F (37.1 C) (Oral)   Wt 148 lb 12.8 oz (67.5 kg)   SpO2 98%   BMI 25.15 kg/m  BP Readings from Last 3 Encounters:  03/16/23 134/72  02/20/23 (!) 148/70  12/01/22 (!) 143/80   Wt Readings from Last 3 Encounters:  03/16/23 148 lb 12.8 oz (67.5 kg)  02/20/23 147 lb 12.8 oz (67 kg)  11/30/22 146 lb (66.2 kg)      Physical Exam Constitutional:      General: She is not in acute distress.    Appearance: Normal appearance.  HENT:     Head: Normocephalic and atraumatic.     Nose: Nose normal.     Mouth/Throat:     Mouth: Mucous membranes are moist.  Cardiovascular:     Rate and Rhythm: Normal rate.  Pulmonary:     Effort: Pulmonary effort is normal.  Abdominal:     General: Abdomen is flat. Bowel sounds are normal.     Palpations: Abdomen is soft.     Tenderness: There is no abdominal tenderness. There is no right CVA  tenderness, left CVA tenderness or rebound.  Skin:    General: Skin is warm and dry.  Neurological:     Mental Status: She is alert and oriented to person, place, and time. Mental status is at baseline.      Results for orders placed or performed in visit on 03/16/23  POCT Urinalysis Dipstick (Automated)  Result Value Ref Range   Color, UA yellow    Clarity, UA cloudy    Glucose, UA Negative Negative   Bilirubin, UA neg    Ketones, UA neg    Spec Grav, UA 1.020 1.010 - 1.025   Blood, UA 2+    pH, UA 6.0 5.0 - 8.0   Protein, UA Positive (A) Negative   Urobilinogen, UA negative (A) 0.2 or 1.0 E.U./dL   Nitrite, UA neg    Leukocytes, UA Moderate (2+) (A) Negative      Assessment & Plan:  Suspected UTI -     Amoxicillin; Take 1 tablet (500 mg total) by mouth 2 (two) times daily for 7 days.  Dispense: 14 tablet; Refill: 0  Suprapubic pressure -     POCT Urinalysis Dipstick (Automated) -     Urine Culture  Acute symptoms concerning for UTI.  POC UA with 2+ leuks, 2+ RBCs, protein, SG 1.020.  Start ABX while awaiting culture results.  Change antibiotic if needed based on results.  Given precautions.  Return if symptoms worsen or fail to improve.   Deeann Saint, MD

## 2023-03-18 ENCOUNTER — Telehealth: Payer: Self-pay | Admitting: *Deleted

## 2023-03-18 NOTE — Telephone Encounter (Addendum)
Abdominal aortogram scheduled at [facility] for: Thursday March 19, 2023 11:30 AM Arrival time [facility] Main Entrance A at: 9:30 AM  Nothing to eat after midnight prior to procedure, clear liquids until 5 AM day of procedure.  Medication instructions: -Hold:  Xarelto-last dose 03/15/23 until post procedure/Lovenox bridge/see 02/20/23 Pharmacy Note for details -Other usual morning medications can be taken with sips of water including aspirin 81 mg.  Confirmed patient has responsible adult to drive home post procedure and be with patient first 24 hours after arriving home.  Plan to go home the same day, you will only stay overnight if medically necessary.  Reviewed procedure instructions with patient.

## 2023-03-19 ENCOUNTER — Other Ambulatory Visit: Payer: Self-pay

## 2023-03-19 ENCOUNTER — Encounter (HOSPITAL_COMMUNITY)
Admission: RE | Disposition: A | Discharge status: Home / Self Care | Payer: Self-pay | Source: Ambulatory Visit | Attending: Cardiovascular Disease

## 2023-03-19 ENCOUNTER — Observation Stay (HOSPITAL_COMMUNITY)
Admission: RE | Admit: 2023-03-19 | Discharge: 2023-03-21 | Disposition: A | Discharge status: Home / Self Care | Payer: Medicare Other | Source: Ambulatory Visit | Attending: Cardiovascular Disease | Admitting: Cardiovascular Disease

## 2023-03-19 DIAGNOSIS — Z79899 Other long term (current) drug therapy: Secondary | ICD-10-CM | POA: Insufficient documentation

## 2023-03-19 DIAGNOSIS — F17210 Nicotine dependence, cigarettes, uncomplicated: Secondary | ICD-10-CM | POA: Diagnosis not present

## 2023-03-19 DIAGNOSIS — Z86718 Personal history of other venous thrombosis and embolism: Secondary | ICD-10-CM | POA: Diagnosis not present

## 2023-03-19 DIAGNOSIS — D6859 Other primary thrombophilia: Secondary | ICD-10-CM | POA: Diagnosis present

## 2023-03-19 DIAGNOSIS — I2510 Atherosclerotic heart disease of native coronary artery without angina pectoris: Secondary | ICD-10-CM | POA: Diagnosis not present

## 2023-03-19 DIAGNOSIS — I70211 Atherosclerosis of native arteries of extremities with intermittent claudication, right leg: Secondary | ICD-10-CM

## 2023-03-19 DIAGNOSIS — I10 Essential (primary) hypertension: Secondary | ICD-10-CM | POA: Diagnosis not present

## 2023-03-19 DIAGNOSIS — Z7901 Long term (current) use of anticoagulants: Secondary | ICD-10-CM | POA: Diagnosis not present

## 2023-03-19 DIAGNOSIS — Z86711 Personal history of pulmonary embolism: Secondary | ICD-10-CM | POA: Diagnosis not present

## 2023-03-19 DIAGNOSIS — I739 Peripheral vascular disease, unspecified: Principal | ICD-10-CM | POA: Diagnosis present

## 2023-03-19 HISTORY — PX: ABDOMINAL AORTOGRAM W/LOWER EXTREMITY: CATH118223

## 2023-03-19 LAB — POCT ACTIVATED CLOTTING TIME
Activated Clotting Time: 214 seconds
Activated Clotting Time: 220 seconds
Activated Clotting Time: 275 seconds
Activated Clotting Time: 287 seconds
Activated Clotting Time: 275 seconds
Activated Clotting Time: 281 seconds
Activated Clotting Time: 213 seconds
Activated Clotting Time: 207 s
Activated Clotting Time: 177 seconds
Activated Clotting Time: 195 s

## 2023-03-19 SURGERY — ABDOMINAL AORTOGRAM W/LOWER EXTREMITY
Anesthesia: LOCAL

## 2023-03-19 MED ORDER — IODIXANOL 320 MG/ML IV SOLN
INTRAVENOUS | Status: DC | PRN
  Administered 2023-03-19: 100 mL via INTRA_ARTERIAL

## 2023-03-19 MED ORDER — SODIUM CHLORIDE 0.9 % WEIGHT BASED INFUSION
3.0000 mL/kg/h | INTRAVENOUS | Status: DC
  Administered 2023-03-19: 3 mL/kg/h via INTRAVENOUS

## 2023-03-19 MED ORDER — RIVAROXABAN 20 MG PO TABS
20.0000 mg | ORAL_TABLET | Freq: Every day | ORAL | Status: DC
  Administered 2023-03-20: 20 mg via ORAL
  Filled 2023-03-19: qty 1

## 2023-03-19 MED ORDER — SODIUM CHLORIDE 0.9% FLUSH: 3.0000 mL | INTRAVENOUS | Status: DC | PRN

## 2023-03-19 MED ORDER — SODIUM CHLORIDE 0.9 % IV SOLN: 250.0000 mL | INTRAVENOUS | Status: DC | PRN

## 2023-03-19 MED ORDER — HEPARIN (PORCINE) IN NACL 1000-0.9 UT/500ML-% IV SOLN
INTRAVENOUS | Status: DC | PRN
  Administered 2023-03-19 (×2): 500 mL

## 2023-03-19 MED ORDER — SODIUM CHLORIDE 0.9% FLUSH: 3.0000 mL | Freq: Two times a day (BID) | INTRAVENOUS | Status: DC

## 2023-03-19 MED ORDER — HYDRALAZINE HCL 20 MG/ML IJ SOLN
INTRAMUSCULAR | Status: AC
  Filled 2023-03-19: qty 1

## 2023-03-19 MED ORDER — HEPARIN SODIUM (PORCINE) 1000 UNIT/ML IJ SOLN
INTRAMUSCULAR | Status: AC
  Filled 2023-03-19: qty 10

## 2023-03-19 MED ORDER — MIDAZOLAM HCL 2 MG/2ML IJ SOLN
INTRAMUSCULAR | Status: DC | PRN
  Administered 2023-03-19 (×3): 1 mg via INTRAVENOUS

## 2023-03-19 MED ORDER — LIDOCAINE HCL (PF) 1 % IJ SOLN
INTRAMUSCULAR | Status: AC
  Filled 2023-03-19: qty 30

## 2023-03-19 MED ORDER — AMLODIPINE BESYLATE 2.5 MG PO TABS
2.5000 mg | ORAL_TABLET | Freq: Two times a day (BID) | ORAL | Status: DC
  Administered 2023-03-19 – 2023-03-21 (×4): 2.5 mg via ORAL
  Filled 2023-03-19 (×4): qty 1

## 2023-03-19 MED ORDER — ASPIRIN 81 MG PO CHEW
81.0000 mg | CHEWABLE_TABLET | ORAL | Status: AC
  Administered 2023-03-19: 81 mg via ORAL
  Filled 2023-03-19: qty 1

## 2023-03-19 MED ORDER — LIDOCAINE HCL (PF) 1 % IJ SOLN
INTRAMUSCULAR | Status: DC | PRN
  Administered 2023-03-19: 10 mL

## 2023-03-19 MED ORDER — ACETAMINOPHEN 325 MG PO TABS: 650.0000 mg | ORAL_TABLET | ORAL | Status: DC | PRN

## 2023-03-19 MED ORDER — SODIUM CHLORIDE 0.9 % WEIGHT BASED INFUSION: 1.0000 mL/kg/h | INTRAVENOUS | Status: DC

## 2023-03-19 MED ORDER — SODIUM CHLORIDE 0.9 % IV SOLN: INTRAVENOUS | Status: AC

## 2023-03-19 MED ORDER — LABETALOL HCL 5 MG/ML IV SOLN: 10.0000 mg | INTRAVENOUS | Status: DC | PRN

## 2023-03-19 MED ORDER — CLOPIDOGREL BISULFATE 300 MG PO TABS
ORAL_TABLET | ORAL | Status: DC | PRN
  Administered 2023-03-19: 300 mg via ORAL

## 2023-03-19 MED ORDER — HYDRALAZINE HCL 20 MG/ML IJ SOLN
INTRAMUSCULAR | Status: DC | PRN
  Administered 2023-03-19: 10 mg via INTRAVENOUS

## 2023-03-19 MED ORDER — ONDANSETRON HCL 4 MG/2ML IJ SOLN: 4.0000 mg | Freq: Four times a day (QID) | INTRAMUSCULAR | Status: DC | PRN

## 2023-03-19 MED ORDER — MIDAZOLAM HCL 2 MG/2ML IJ SOLN
INTRAMUSCULAR | Status: AC
  Filled 2023-03-19: qty 2

## 2023-03-19 MED ORDER — ASPIRIN 81 MG PO TBEC
81.0000 mg | DELAYED_RELEASE_TABLET | Freq: Every day | ORAL | Status: DC
  Administered 2023-03-20: 81 mg via ORAL
  Filled 2023-03-19: qty 1

## 2023-03-19 MED ORDER — SODIUM CHLORIDE 0.9% FLUSH
3.0000 mL | Freq: Two times a day (BID) | INTRAVENOUS | Status: DC
  Administered 2023-03-20 – 2023-03-21 (×2): 3 mL via INTRAVENOUS

## 2023-03-19 MED ORDER — HEPARIN SODIUM (PORCINE) 1000 UNIT/ML IJ SOLN
INTRAMUSCULAR | Status: DC | PRN
  Administered 2023-03-19: 7000 [IU] via INTRAVENOUS
  Administered 2023-03-19: 10000 [IU] via INTRAVENOUS
  Administered 2023-03-19: 2000 [IU] via INTRAVENOUS
  Administered 2023-03-19: 5000 [IU] via INTRAVENOUS

## 2023-03-19 MED ORDER — FENTANYL CITRATE (PF) 100 MCG/2ML IJ SOLN
INTRAMUSCULAR | Status: AC
  Filled 2023-03-19: qty 2

## 2023-03-19 MED ORDER — PRAVASTATIN SODIUM 10 MG PO TABS
20.0000 mg | ORAL_TABLET | Freq: Every evening | ORAL | Status: DC
  Administered 2023-03-19 – 2023-03-20 (×2): 20 mg via ORAL
  Filled 2023-03-19 (×2): qty 2

## 2023-03-19 MED ORDER — CLOPIDOGREL BISULFATE 300 MG PO TABS
ORAL_TABLET | ORAL | Status: AC
  Filled 2023-03-19: qty 1

## 2023-03-19 MED ORDER — LISINOPRIL 20 MG PO TABS
40.0000 mg | ORAL_TABLET | Freq: Every day | ORAL | Status: DC
  Administered 2023-03-20 – 2023-03-21 (×2): 40 mg via ORAL
  Filled 2023-03-19 (×2): qty 2

## 2023-03-19 MED ORDER — HYDRALAZINE HCL 20 MG/ML IJ SOLN: 5.0000 mg | INTRAMUSCULAR | Status: DC | PRN

## 2023-03-19 MED ORDER — OXYCODONE-ACETAMINOPHEN 5-325 MG PO TABS
1.0000 | ORAL_TABLET | Freq: Once | ORAL | Status: AC
  Administered 2023-03-19: 1 via ORAL

## 2023-03-19 MED ORDER — CLOPIDOGREL BISULFATE 75 MG PO TABS
75.0000 mg | ORAL_TABLET | Freq: Every day | ORAL | Status: DC
  Administered 2023-03-20 – 2023-03-21 (×2): 75 mg via ORAL
  Filled 2023-03-19 (×2): qty 1

## 2023-03-19 MED ORDER — FENTANYL CITRATE (PF) 100 MCG/2ML IJ SOLN
INTRAMUSCULAR | Status: DC | PRN
  Administered 2023-03-19 (×3): 25 ug via INTRAVENOUS

## 2023-03-19 MED ORDER — CARVEDILOL 3.125 MG PO TABS
3.1250 mg | ORAL_TABLET | Freq: Two times a day (BID) | ORAL | Status: DC
  Administered 2023-03-20 – 2023-03-21 (×3): 3.125 mg via ORAL
  Filled 2023-03-19 (×3): qty 1

## 2023-03-19 MED ORDER — OXYCODONE-ACETAMINOPHEN 5-325 MG PO TABS
ORAL_TABLET | ORAL | Status: AC
  Filled 2023-03-19: qty 1

## 2023-03-19 MED ORDER — HEPARIN (PORCINE) 25000 UT/250ML-% IV SOLN
500.0000 [IU]/h | INTRAVENOUS | Status: AC
  Administered 2023-03-20: 500 [IU]/h via INTRAVENOUS
  Filled 2023-03-19: qty 250

## 2023-03-19 SURGICAL SUPPLY — 20 items
BAG SNAP BAND KOVER 36X36 (MISCELLANEOUS) ×2
BALLN CHOCOLATE 4.0X120X135 (BALLOONS) ×1
BALLN IN.PACT DCB 5X150 (BALLOONS) ×1
CATH ANGIO 5F PIGTAIL 65CM (CATHETERS) ×1
CATH CROSS OVER TEMPO 5F (CATHETERS) ×1
CATH STRAIGHT 5FR 65CM (CATHETERS) ×1
CATH VIANCE CROSS STAND 150CM (MICROCATHETER) ×1
COVER DOME SNAP 22 D (MISCELLANEOUS) ×1
KIT ENCORE 26 ADVANTAGE (KITS) ×1
KIT ESSENTIALS PG (KITS) ×1
PROTECTION STATION PRESSURIZED (MISCELLANEOUS) ×1
SET ATX-X65L (MISCELLANEOUS) ×1
SHEATH CATAPULT 6FR 45 (SHEATH) ×1
SHEATH PINNACLE 5F 10CM (SHEATH) ×1
SHEATH PINNACLE 6F 10CM (SHEATH) ×1
SHEATH PROBE COVER 6X72 (BAG) ×1
STENT SUPERA 5.0X120X120 (Permanent Stent) ×1 IMPLANT
TRAY PV CATH (CUSTOM PROCEDURE TRAY) ×1
WIRE HITORQ VERSACORE ST 145CM (WIRE) ×2
WIRE SHEPHERD 4G .014 (WIRE) ×1

## 2023-03-19 NOTE — Progress Notes (Signed)
6 fr sheath removed from lef; sheath tip inact, pressure held x 30- small ooze noted - pressure held x 10 more in.R dp+ with doppler;dry, sterile dressing applied. I v infusing w/out difficulty- pt advised if she laughs , coughs, sneezes bears down with lower abd. To hold pressure top lfa- pt. Return demonstrated understanding of these instruction.   Transported to bed, RN, TEle- stable

## 2023-03-19 NOTE — Progress Notes (Signed)
ANTICOAGULATION CONSULT NOTE - Initial Consult  Pharmacy Consult for heparin Indication:  Lupus Anticoagulant / hx DVT  Allergies  Allergen Reactions   Prednisone Other (See Comments)    No energy "stalls out".    Makes pt  Jittery.   Tramadol Other (See Comments)    Constipation,  Makes pt  Jittery.   Crestor [Rosuvastatin Calcium]     Feels fatigued, RE  CHALLENGE - UNABLE TO USE 12/28/18   Hctz [Hydrochlorothiazide]     Dizzy    Repatha [Evolocumab] Other (See Comments)    Per patient never took this medication    Patient Measurements: Height: 5' 4.5" (163.8 cm) Weight: 65.8 kg (145 lb) IBW/kg (Calculated) : 55.85 Heparin Dosing Weight: 65kg  Vital Signs: BP: 137/72 (08/01 2000) Pulse Rate: 97 (08/01 2000)  Labs: No results for input(s): "HGB", "HCT", "PLT", "APTT", "LABPROT", "INR", "HEPARINUNFRC", "HEPRLOWMOCWT", "CREATININE", "CKTOTAL", "CKMB", "TROPONINIHS" in the last 72 hours.  CrCl cannot be calculated (Patient's most recent lab result is older than the maximum 21 days allowed.).   Medical History: Past Medical History:  Diagnosis Date   Back pain    CAD S/P percutaneous coronary angioplasty 09/13/2018   09/13/2018 - Cath & Staged DES PCI on 09/14/2018: DES PCI: Cx99%&55% - Resolute Onyx DES 3.5 x 30 (3.6 mm), p-mLAD (prox of D1 almost to D2) - Resolute Onyx DES 3.0 x 22 (3.3 mm); DFR on pRCA 70% - 0.99, Not significant -> Rec Med Rx.   DVT (deep venous thrombosis) (HCC)    Heart attack (HCC) 09/09/2018   Hypertension    Lupus anticoagulant syndrome (HCC)    NSTEMI (non-ST elevated myocardial infarction) (HCC) 09/12/2018   Initial presentation with chest pain was on 09/09/2018, recurrent pain on 1/26 2020 with positive troponins.  Cath showed three-vessel CAD -> declined CABG, opted for two-vessel DES PCI (LAD and CX, negative DFR RCA)   Pulmonary embolism (HCC)      Assessment: 66 yoF on Xarelto PTA for hx lupus anticoagulant and VTE. Pt admitted for PVD  angio. Pharmacy asked to start low-dose IV heparin overnight starting 6h after sheath removal (~2000).  Goal of Therapy:  Heparin level <0.1 units/ml Monitor platelets by anticoagulation protocol: Yes   Plan:  Heparin 500 units/h no bolus starting at 0200 Xarelto scheduled to restart 8/2 at 1700 - will stop heparin at this time  Fredonia Highland, PharmD, BCPS, [facility] Clinical Pharmacist [phone] Please check AMION for all [facility] Pharmacy numbers 03/19/2023

## 2023-03-19 NOTE — Interval H&P Note (Signed)
History and Physical Interval Note:  03/19/2023 1:24 PM  [name] [name]  has presented today for surgery, with the diagnosis of pad.  The various methods of treatment have been discussed with the patient and family. After consideration of risks, benefits and other options for treatment, the patient has consented to  Procedure(s): ABDOMINAL AORTOGRAM W/LOWER EXTREMITY (N/A) as a surgical intervention.  The patient's history has been reviewed, patient examined, no change in status, stable for surgery.  I have reviewed the patient's chart and labs.  Questions were answered to the patient's satisfaction.     Nanetta Batty

## 2023-03-19 NOTE — Progress Notes (Signed)
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 FYI for AM rounding team: Dr. Allyson Sabal relayed that patient will need to remain inpatient for 48 hrs due to prior groin bleed.

## 2023-03-19 NOTE — Progress Notes (Signed)
Resting ; 700 cc via purewick- dp remain with doppler- awaiting act to be in therapeutic range.

## 2023-03-20 ENCOUNTER — Encounter (HOSPITAL_COMMUNITY): Payer: Self-pay | Admitting: Cardiovascular Disease

## 2023-03-20 DIAGNOSIS — I739 Peripheral vascular disease, unspecified: Secondary | ICD-10-CM | POA: Diagnosis not present

## 2023-03-20 DIAGNOSIS — Z86718 Personal history of other venous thrombosis and embolism: Secondary | ICD-10-CM | POA: Diagnosis not present

## 2023-03-20 DIAGNOSIS — F17210 Nicotine dependence, cigarettes, uncomplicated: Secondary | ICD-10-CM | POA: Diagnosis not present

## 2023-03-20 DIAGNOSIS — Z86711 Personal history of pulmonary embolism: Secondary | ICD-10-CM | POA: Diagnosis not present

## 2023-03-20 DIAGNOSIS — I10 Essential (primary) hypertension: Secondary | ICD-10-CM | POA: Diagnosis not present

## 2023-03-20 DIAGNOSIS — Z79899 Other long term (current) drug therapy: Secondary | ICD-10-CM | POA: Diagnosis not present

## 2023-03-20 DIAGNOSIS — I2510 Atherosclerotic heart disease of native coronary artery without angina pectoris: Secondary | ICD-10-CM | POA: Diagnosis not present

## 2023-03-20 DIAGNOSIS — Z7901 Long term (current) use of anticoagulants: Secondary | ICD-10-CM | POA: Diagnosis not present

## 2023-03-20 LAB — LIPID PANEL
Cholesterol: 177 mg/dL (ref 0–200)
HDL: 31 mg/dL — ABNORMAL LOW (ref >40)
LDL Cholesterol: 96 mg/dL (ref 0–99)
Total CHOL/HDL Ratio: 5.7 RATIO
Triglycerides: 248 mg/dL — ABNORMAL HIGH (ref <150)
VLDL: 50 mg/dL — ABNORMAL HIGH (ref 0–40)

## 2023-03-20 MED ORDER — FENOFIBRATE 160 MG PO TABS
160.0000 mg | ORAL_TABLET | Freq: Every day | ORAL | Status: DC
  Administered 2023-03-20 – 2023-03-21 (×2): 160 mg via ORAL
  Filled 2023-03-20 (×2): qty 1

## 2023-03-20 MED ORDER — AMOXICILLIN 500 MG PO CAPS
500.0000 mg | ORAL_CAPSULE | Freq: Two times a day (BID) | ORAL | Status: DC
  Administered 2023-03-20 – 2023-03-21 (×3): 500 mg via ORAL
  Filled 2023-03-20 (×3): qty 1

## 2023-03-20 MED ORDER — FENOFIBRATE 145 MG PO TABS
145.0000 mg | ORAL_TABLET | Freq: Every day | ORAL | Status: DC
  Filled 2023-03-20: qty 1

## 2023-03-20 NOTE — Care Management Obs Status (Signed)
MEDICARE OBSERVATION STATUS NOTIFICATION   Patient Details  Name: [name] [name] MRN: [id number] Date of Birth: Mar 16, 1956   Medicare Observation Status Notification Given:  Yes    Gala Lewandowsky, RN 03/20/2023, 4:26 PM

## 2023-03-20 NOTE — Progress Notes (Signed)
ANTICOAGULATION CONSULT NOTE - Initial Consult  Pharmacy Consult for heparin Indication:  Lupus Anticoagulant / hx DVT  Allergies  Allergen Reactions   Prednisone Other (See Comments)    No energy "stalls out".    Makes pt  Jittery.   Tramadol Other (See Comments)    Constipation,  Makes pt  Jittery.   Crestor [Rosuvastatin Calcium]     Feels fatigued, RE  CHALLENGE - UNABLE TO USE 12/28/18   Hctz [Hydrochlorothiazide]     Dizzy    Repatha [Evolocumab] Other (See Comments)    Per patient never took this medication    Patient Measurements: Height: 5' 4.5" (163.8 cm) Weight: 65.8 kg (145 lb) IBW/kg (Calculated) : 55.85 Heparin Dosing Weight: 65kg  Vital Signs: Temp: 98.5 F (36.9 C) (08/02 0420) Temp Source: Oral (08/02 0420) BP: 139/69 (08/02 0420) Pulse Rate: 96 (08/02 0420)  Labs: Recent Labs    03/20/23 0057  HGB 11.4*  HCT 34.4*  PLT 400  CREATININE 0.74    Estimated Creatinine Clearance: 60.2 mL/min (by C-G formula based on SCr of 0.74 mg/dL).   Medical History: Past Medical History:  Diagnosis Date   Back pain    CAD S/P percutaneous coronary angioplasty 09/13/2018   09/13/2018 - Cath & Staged DES PCI on 09/14/2018: DES PCI: Cx99%&55% - Resolute Onyx DES 3.5 x 30 (3.6 mm), p-mLAD (prox of D1 almost to D2) - Resolute Onyx DES 3.0 x 22 (3.3 mm); DFR on pRCA 70% - 0.99, Not significant -> Rec Med Rx.   DVT (deep venous thrombosis) (HCC)    Heart attack (HCC) 09/09/2018   Hypertension    Lupus anticoagulant syndrome (HCC)    NSTEMI (non-ST elevated myocardial infarction) (HCC) 09/12/2018   Initial presentation with chest pain was on 09/09/2018, recurrent pain on 1/26 2020 with positive troponins.  Cath showed three-vessel CAD -> declined CABG, opted for two-vessel DES PCI (LAD and CX, negative DFR RCA)   Pulmonary embolism (HCC)      Assessment: 66 yoF on Xarelto PTA for hx lupus anticoagulant and VTE. Pt admitted for PVD angio. Pharmacy asked to start  low-dose IV heparin overnight starting 6h after sheath removal (~2000).  Heparin level <0.1 with heparin running at 500 units/hr. Patient is on low, fixed dose heparin will continue with goal of heparin level < 0.1. Hgb (11.4) and PLTs (400) are stable.   Goal of Therapy:  Heparin level <0.1 units/ml Monitor platelets by anticoagulation protocol: Yes   Plan:  Continue heparin 500 units/hr until 8/2 1700 Xarelto scheduled to restart 8/2 at 1700 Monitor signs/sx of bleeding  Romie Minus, PharmD PGY1 Pharmacy Resident  Please check AMION for all [facility] Pharmacy phone numbers After 10:00 PM, call Main Pharmacy [phone]

## 2023-03-20 NOTE — Plan of Care (Signed)

## 2023-03-20 NOTE — Progress Notes (Addendum)
Rounding Note    Patient Name: [name] [name] Date of Encounter: 03/20/2023  [facility] Cardiologist: Bryan Lemma, MD   Subjective   Feeling good no complaints post procedure  Inpatient Medications    Scheduled Meds:  amLODipine  2.5 mg Oral BID   amoxicillin  500 mg Oral Q12H   aspirin EC  81 mg Oral Daily   carvedilol  3.125 mg Oral BID WC   clopidogrel  75 mg Oral Q breakfast   fenofibrate  160 mg Oral Daily   lisinopril  40 mg Oral Daily   pravastatin  20 mg Oral QPM   rivaroxaban  20 mg Oral Q supper   sodium chloride flush  3 mL Intravenous Q12H   Continuous Infusions:  sodium chloride 75 mL/hr at 03/19/23 2141   sodium chloride     heparin 500 Units/hr (03/20/23 0206)   PRN Meds: sodium chloride, acetaminophen, hydrALAZINE, labetalol, ondansetron (ZOFRAN) IV, sodium chloride flush   Vital Signs    Vitals:   03/19/23 2134 03/19/23 2330 03/20/23 0420 03/20/23 0850  BP: (!) 141/73 124/69 139/69 (!) 150/68  Pulse:  94 96   Resp:  16 17   Temp:  97.9 F (36.6 C) 98.5 F (36.9 C)   TempSrc:  Oral Oral   SpO2:  97% 97%   Weight:      Height:        Intake/Output Summary (Last 24 hours) at 03/20/2023 0907 Last data filed at 03/20/2023 0420 Gross per 24 hour  Intake --  Output 300 ml  Net -300 ml      03/19/2023    9:56 AM 03/16/2023    3:33 PM 02/20/2023   11:02 AM  Last 3 Weights  Weight (lbs) 145 lb 148 lb 12.8 oz 147 lb 12.8 oz  Weight (kg) 65.772 kg 67.495 kg 67.042 kg      Telemetry    Sinus tachycardia 105- Personally Reviewed  ECG    No new tracings- Personally Reviewed  Physical Exam   GEN: No acute distress.   Neck: No JVD Cardiac: RRR, no murmurs, rubs, or gallops.  Respiratory: Clear to auscultation bilaterally. GI: Soft, nontender, non-distended  MS: No edema; No deformity. Neuro:  Nonfocal  Psych: Normal affect   L groin cath site free of redness, discharge, bleeding.  Labs    High Sensitivity Troponin:  No  results for input(s): "TROPONINIHS" in the last 720 hours.   Chemistry Recent Labs  Lab 03/20/23 0057  NA 138  K 3.5  CL 102  CO2 20*  GLUCOSE 97  BUN 6*  CREATININE 0.74  CALCIUM 8.7*  GFRNONAA >60  ANIONGAP 16*    Lipids  Recent Labs  Lab 03/20/23 0057  CHOL 177  TRIG 248*  HDL 31*  LDLCALC 96  CHOLHDL 5.7    Hematology Recent Labs  Lab 03/20/23 0057  WBC 8.9  RBC 3.83*  HGB 11.4*  HCT 34.4*  MCV 89.8  MCH 29.8  MCHC 33.1  RDW 12.5  PLT 400   Thyroid No results for input(s): "TSH", "FREET4" in the last 168 hours.  BNPNo results for input(s): "BNP", "PROBNP" in the last 168 hours.  DDimer No results for input(s): "DDIMER" in the last 168 hours.   Radiology    PERIPHERAL VASCULAR CATHETERIZATION  Result Date: 03/19/2023 Images from the original result were not included.  [id number] LOCATION:  FACILITY: [facility] PHYSICIAN: Nanetta Batty, M.D. 1955-12-21 DATE OF PROCEDURE:  03/19/2023 DATE OF DISCHARGE:  PV Angiogram/Intervention History obtained from chart review.[name] [name] is a 67 y.o.  mild to moderately overweight married Caucasian female mother of 1, grandmother of 2 grandchildren referred by Azalee Course, PA-C for peripheral vascular valuation.  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  I last saw her in the office 05/23/2022.  Her risk factors include ongoing tobacco abuse 1/2 pack/day, 11/17/3.  She is accompanied by her husband Thayer Ohm today.  Hypertension and hyperlipidemia.  She does have CAD status post LAD and circumflex intervention back in January 2020.  She is never had a stroke.  She does have lupus anticoagulant with extensive lower extremity DVT and pulmonary emboli status post endovascular therapy with stenting of her lower extremity venous vasculature as well as placement of an IVC filter.  She was on Coumadin for some time and transition to Xarelto.  When she was on triple therapy for her LAD and  circumflex stent she had GI bleeding.  Aspirin was discontinued.  She was not tolerant to Plavix.  She does complain of bilateral calf claudication over the last year with Dopplers performed 02/27/2022 revealing a right ABI of 0.57, left of 0.52.  She had high-grade lesions in her distal right SFA and popliteal artery and an occluded left SFA.  I performed peripheral angiography intervention 06/02/2022.  I performed [facility] 1 directional arthrectomy followed by [facility] of 8 short segment CTO in the mid right SFA.  She also had a mid left SFA CTO.  She was discharged home the following day and was readmitted that Friday with pain in her left groin, CT showed hematoma, Doppler showed pseudoaneurysm.  She underwent surgical evacuation and correction of the pseudoaneurysm by Dr. Sherral Hammers.  She just saw Dr. Sherral Hammers in the office last week who noted that the incision was dry with some surrounding edema.  She does have some paresthesias down the medial aspect of her left thigh.  Her claudication has resolved and her right SFA remained widely patent by duplex ultrasound 06/12/2022.  Since I saw her in November of last year she has had recurrent claudication over the last 3 to 4 weeks with Doppler study performed 02/16/2023 revealed a decline in her right ABI to 0.48 with an occlusion of her right popliteal artery.  She wishes to proceed with outpatient peripheral angiography and potential endovascular therapy.  Because of her lupus anticoagulant, and being on Xarelto, she will need a Lovenox bridge as she did with her prior procedure. Pre Procedure Diagnosis: Peripheral arterial disease Post Procedure Diagnosis: Peripheral arterial disease Operators: Dr. Nanetta Batty Procedures Performed:  1.  Ultrasound-guided left common femoral access  2.  Abdominal aortogram/bilateral iliac angiogram  3.  Contralateral access  4.  Chocolate balloon angioplasty followed by [facility] distal right SFA/popliteal artery             5.  Stent distal right  SFA/right popliteal artery PROCEDURE DESCRIPTION: The patient was brought to the second floor [place] [facility] cath lab in the the postabsorptive state. She was premedicated with IV Versed and fentanyl. Her left groin was prepped and shaved in usual sterile fashion. Xylocaine 1% was used for local anesthesia. A 5 French sheath was inserted into the left common femoral artery using standard Seldinger technique.  A 5 French pigtail catheters placed the distal abdominal aorta.  Distal abdominal aortography and bilateral iliac angiography were performed.  Following this contralateral access was obtained with a crossover catheter and endhole  catheter.  Right lower extremity angiography runoff was performed using bolus chase, digital subtraction and step table technique.  Omnipaque dye was used for the entirety of the case (100 cc of contrast total to patient).  Retrograde with pressures monitored during the case.  Angiographic Data: 1: Abdominal aortogram-distal abdominal aorta was free of significant disease 2: Right lower extremity-there was a CTO in the distal right SFA and proximal right popliteal artery with three-vessel runoff   Ms. Boardley has early restenosis of her distal right SFA and popliteal artery status post directional atherectomy and DCB a year ago.  She has resting foot pain/critical ischemia.  Will proceed with attempt at PTA plus or minus stenting. Procedure Description: The 6 French sheath in the left common femoral artery was exchanged for a 6 French 45 cm catapulte sheath.  Patient received a total of 24,000's of heparin with an ACT of 275.  Omnipaque dye was used for the entirety of the intervention.  I was able to cross the CTO with a Viance CTO catheter along the 0.143 g shepherd wire.  Following that I performed PTA with a 4 mm x 120 mm long chocolate balloon.  Following this I performed DCB with a 5 mm x 150 mm long Impact Admiral balloon at nominal pressures for 2 minutes.  I then per  performed stenting using a 5 mm x 120 mm long SUPERA self-expanding stent resulting in reduction of a total occlusion to 0% residual with excellent blood flow and preserved trifurcation.  The patient received 3 to milligrams of clopidogrel at the end of the case. Final Impression: Successful PTA, DCB and stenting of a distal right SFA/proximal proximal right popliteal CTO in the setting of rest pain/critical ischemia.  Patient has lupus anticoagulant.  She was difficult to anticoagulate.  She will need "triple therapy including aspirin 81 mg, clopidogrel and Xarelto for 30 days after which aspirin can be discontinued.  I am going to start her on low-dose heparin and will transition her to Xarelto tomorrow.  The sheath will be removed once ACT falls below 170 and pressure will be held.  Patient will be hydrated overnight.  Because of a prior groin bleed I anticipate discharge on Saturday. Nanetta Batty. MD, [facility] 03/19/2023 3:50 PM     Cardiac Studies   Abdominal aortogram with lower extremity 03/19/2023 1: Abdominal aortogram-distal abdominal aorta was free of significant disease 2: Right lower extremity-there was a CTO in the distal right SFA and proximal right popliteal artery with three-vessel runoff   IMPRESSION: Ms. Elsey has early restenosis of her distal right SFA and popliteal artery status post directional atherectomy and DCB a year ago.  She has resting foot pain/critical ischemia.  Will proceed with attempt at PTA plus or minus stenting.   Procedure Description: The 6 French sheath in the left common femoral artery was exchanged for a 6 French 45 cm catapulte sheath.  Patient received a total of 24,000's of heparin with an ACT of 275.  Omnipaque dye was used for the entirety of the intervention.  I was able to cross the CTO with a Viance CTO catheter along the 0.143 g shepherd wire.  Following that I performed PTA with a 4 mm x 120 mm long chocolate balloon.  Following this I performed DCB with a  5 mm x 150 mm long Impact Admiral balloon at nominal pressures for 2 minutes.  I then per performed stenting using a 5 mm x 120 mm long SUPERA self-expanding stent  resulting in reduction of a total occlusion to 0% residual with excellent blood flow and preserved trifurcation.  The patient received 3 to milligrams of clopidogrel at the end of the case.   Final Impression: Successful PTA, DCB and stenting of a distal right SFA/proximal proximal right popliteal CTO in the setting of rest pain/critical ischemia.  Patient has lupus anticoagulant.  She was difficult to anticoagulate.  She will need "triple therapy including aspirin 81 mg, clopidogrel and Xarelto for 30 days after which aspirin can be discontinued.  I am going to start her on low-dose heparin and will transition her to Xarelto tomorrow.  The sheath will be removed once ACT falls below 170 and pressure will be held.  Patient will be hydrated overnight.  Because of a prior groin bleed I anticipate discharge on Saturday.   Patient Profile     67 y.o. female  with hx of CAD s/p DES to LAD and LCX in 08/2018, PAD with right above-the-knee popliteal artery CTO and left SFA CTO s/p popliteal directional atherectomy followed by drug-coated balloon angioplasty of the right CTO on 06/02/2022 complicated by left common femoral artery pseudoaneurysm requiring evacuation of large hematoma and suture hemostasis of arteriotomy as well as wound VAC,  hypertension, hyperlipidemia intolerant to statins, acute blood loss anemia, lupus anticoagulant syndrome with history of extensive lower extremity DVT and PE s/p endovascular therapy with stenting of lower extremity venous vasculature and placement of IVC filter on chronic anticoagulation with Xarelto, CVA, chronic back pain, and tobacco abuse   Assessment & Plan    PAD Left Femoral Pseudoaneurysm Had CTO in the distal right SFA and proximal right popliteal artery with three-vessel runoff. Successful PTA, DCB and  stenting of a distal right SFA/proximal proximal right popliteal CTO in the setting of rest pain/critical ischemia.  Ideally would be on triple therapy with aspirin, Plavix and Xarelto for 30 days.  However patient very adamant about not being on aspirin given previous history of significant bleeding event.  Discussed with Dr. Allyson Sabal who is okay with dropping the aspirin and continuing with Plavix and Xarelto. Also discussed with Dr. Gery Pray.  Patient is to be discharged tomorrow if stable and doing well. Stop heparin 2 hours prior to starting Xarelto.  Per Dr. Allyson Sabal.    CAD History of NSTEMI in 08/2018 s/p DES to LAD and LCX.  No anginal symptoms.  Continue Xarelto and Plavix as above.  Intolerant to statins in the past.   Hypertension Has significantly elevated blood pressures here between 150-180.  Patient states that this is well-controlled at home and she has whitecoat syndrome being here.  She is very assertive about her medical management and has outlined the following medications as below.  She states that she feels poorly with lower blood pressures. Continue Amlodipine 2.5mg  daily, Lisinopril 40mg  daily, and Coreg 3.125mg  twice daily.   Hyperlipidemia Continue Fenofibrate. Continue pravastatin 20 mg.  Previously reported to be intolerant to statins and PCSK9 inhibitor. LDL 96. Discuss up titratrion outpatient.      Lupus Anticoagulant Syndrome History of DVT and PE History of extensive lower extremity DVT and PE s/p endovascular therapy with stenting of lower extremity venous vasculature and placement of IVC filter. Continue Xarelto 20mg  daily.   For questions or updates, please contact [facility] Please consult [email or web address] for contact info under        Signed, Abagail Kitchens, PA-C  03/20/2023, 9:07 AM     Patient seen and examined with Cephus Shelling  [employer].  Agree as above, with the following exceptions and changes as noted below. Feels well, groin site stable. Gen: NAD,  CV: RRR, no murmurs, Lungs: clear, Abd: soft, Extrem: Warm, well perfused, no edema, Neuro/Psych: alert and oriented x 3, normal mood and affect. All available labs, radiology testing, previous records reviewed. Overnight observation will continue per procedural plan. Dc tomorrow likely.   Parke Poisson, MD 03/20/23 12:17 PM

## 2023-03-21 ENCOUNTER — Other Ambulatory Visit (HOSPITAL_COMMUNITY): Payer: Self-pay

## 2023-03-21 ENCOUNTER — Other Ambulatory Visit: Payer: Self-pay | Admitting: Cardiology

## 2023-03-21 DIAGNOSIS — Z86711 Personal history of pulmonary embolism: Secondary | ICD-10-CM | POA: Diagnosis not present

## 2023-03-21 DIAGNOSIS — I2510 Atherosclerotic heart disease of native coronary artery without angina pectoris: Secondary | ICD-10-CM | POA: Diagnosis not present

## 2023-03-21 DIAGNOSIS — F17210 Nicotine dependence, cigarettes, uncomplicated: Secondary | ICD-10-CM | POA: Diagnosis not present

## 2023-03-21 DIAGNOSIS — Z79899 Other long term (current) drug therapy: Secondary | ICD-10-CM | POA: Diagnosis not present

## 2023-03-21 DIAGNOSIS — I10 Essential (primary) hypertension: Secondary | ICD-10-CM | POA: Diagnosis not present

## 2023-03-21 DIAGNOSIS — Z86718 Personal history of other venous thrombosis and embolism: Secondary | ICD-10-CM | POA: Diagnosis not present

## 2023-03-21 DIAGNOSIS — Z7901 Long term (current) use of anticoagulants: Secondary | ICD-10-CM | POA: Diagnosis not present

## 2023-03-21 DIAGNOSIS — Z7902 Long term (current) use of antithrombotics/antiplatelets: Secondary | ICD-10-CM

## 2023-03-21 DIAGNOSIS — D6859 Other primary thrombophilia: Secondary | ICD-10-CM

## 2023-03-21 DIAGNOSIS — I739 Peripheral vascular disease, unspecified: Secondary | ICD-10-CM | POA: Diagnosis not present

## 2023-03-21 MED ORDER — CLOPIDOGREL BISULFATE 75 MG PO TABS
75.0000 mg | ORAL_TABLET | Freq: Every day | ORAL | 11 refills | Status: DC
  Filled 2023-03-21: qty 30, 30d supply, fill #0

## 2023-03-21 NOTE — Discharge Summary (Addendum)
Discharge Summary    Patient ID: [name] [name] MRN: [id number]; DOB: [birth year]  Admit date: 03/19/2023 Discharge date: 03/21/2023  PCP:  Deeann Saint, MD   [facility] Providers Cardiologist:  Bryan Lemma, MD  Cardiology APP:  Azalee Course, [place]       Discharge Diagnoses    Principal Problem:   Claudication in peripheral vascular disease [facility]) Active Problems:   Essential hypertension   Hypercoagulation syndrome (HCC)   History of DVT of lower extremity   History of pulmonary embolus (PE)   CAD -S/P PCI   PAD (peripheral artery disease) (HCC)    Diagnostic Studies/Procedures    Abdominal Aortogram with lower extremity 03/19/23 Angiographic Data:    1: Abdominal aortogram-distal abdominal aorta was free of significant disease 2: Right lower extremity-there was a CTO in the distal right SFA and proximal right popliteal artery with three-vessel runoff   IMPRESSION: [name] has early restenosis of her distal right SFA and popliteal artery status post directional atherectomy and DCB a year ago.  She has resting foot pain/critical ischemia.  Will proceed with attempt at PTA plus or minus stenting.  Final Impression: Successful PTA, DCB and stenting of a distal right SFA/proximal proximal right popliteal CTO in the setting of rest pain/critical ischemia.  Patient has lupus anticoagulant.  She was difficult to anticoagulate.  She will need "triple therapy including aspirin 81 mg, clopidogrel and Xarelto for 30 days after which aspirin can be discontinued.  I am going to start her on low-dose heparin and will transition her to Xarelto tomorrow.  The sheath will be removed once ACT falls below 170 and pressure will be held.  Patient will be hydrated overnight.  Because of a prior groin bleed I anticipate discharge on Saturday.   _____________   History of Present Illness     [name] [name] is a 67 y.o. female with a past medical history of HTN, HLD, CAD s/p LAD and  circumflex intervention in 2020, lupus anticoagulant with history of extensive lower extremity DVTs and PE s/p endovascular therapy with stenting of her lower extremity venous vasculature and placement of IVC filter on xarelto.  Patient is followed by Dr. Herbie Baltimore and is seen by Dr. Allyson Sabal for treatment of peripheral artery disease.  Patient has been complaining of bilateral calf claudication over the past year.  Dopplers performed 02/2022 revealed a right ABI of 0.57 and a left of 0.52.  Patient had a high-grade lesions in her distal right SFA and popliteal artery and an occluded left SFA.  Dr. Allyson Sabal performed peripheral angiography intervention on 06/02/2022.  He performed [facility] 1 directional atherectomy followed by [facility] of 8 short segment CTO of the mid right SFA.  Patient was also noted to have mid SFA CTO.  Patient was discharged the following day, but was readmitted with pain in her left groin.  CT showed a hematoma and Doppler showed pseudoaneurysm.  Patient underwent surgical evacuation and correction of the pseudoaneurysm by Dr. Sherral Hammers.  Her claudication resolved and her right SFA remained widely patent by duplex ultrasound in 05/2022.  Patient was seen by Dr. Allyson Sabal in 06/2022 and patient reported having recurrent claudication over the last 3 to 4 weeks.  Patient had Doppler studies performed 02/16/2023 that revealed a decline in her right ABI to 0.48 with an occlusion of her right popliteal artery.  Although she has a known left SFA CTO, she is asymptomatic in her left leg. After discussion with Dr. Allyson Sabal,  patient wished to proceed with outpatient peripheral angiography and potential endovascular therapy.  Because of her lupus anticoagulant on Xarelto, patient required a Lovenox bridge prior to procedure.  Hospital Course     Consultants: None  Patient presented on 8/1 for planned peripheral angiography and potential endovascular therapy.  She was taken to the Cath Lab with Dr. Allyson Sabal.  Her distal  abdominal aorta was free of significant disease.  In the right lower extremity, there was a CTO in the distal right SFA and proximal right popliteal artery with 3 vessel runoff.  Patient underwent successful PTA, DCB, and stenting of the distal right SFA/proximal right popliteal CTO.  As patient has lupus anticoagulant, there is concern that she will be difficult to anticoagulate.  Dr. Allyson Sabal recommends that patient be on "triple therapy" with aspirin 81 mg, clopidogrel, and Xarelto for 30 days.  After 30 days, aspirin can be discontinued and patient will remain on clopidogrel and Xarelto.  After the procedure, patient was treated with low-dose heparin and her Xarelto was resumed on 8/2.  Because of patient's history of prior groin bleed after peripheral intervention in the past, patient was held in the hospital for 48 hours for monitoring.  While admitted, patient was very adamant about not being on aspirin given previous history of significant bleeding event.  This was discussed with Dr. Allyson Sabal who agreed to stop aspirin and continuing Plavix and Xarelto.  Patient was seen by Dr. Jacques Navy on 8/3 and was deemed stable for discharge.  Peripheral artery disease History of left femoral pseudoaneurysm, hematoma - After her peripheral angiography intervention in 05/2022, patient developed a groin hematoma and pseudoaneurysm.  Required surgical evacuation and correction of pseudoaneurysm with Dr. Sherral Hammers -Patient presented 8/1 for planned peripheral angiography with Dr. Allyson Sabal.  Patient was found to have a right SFA/proximal right popliteal CTO and was treated with successful PTA, DCB, and stenting -Dr. Allyson Sabal initially recommended that patient be on "triple therapy" with aspirin, Plavix, Xarelto for 30 days.  However, patient was adamant that she did not want to take aspirin given her history of significant bleeding event.  This was discussed with Dr. Allyson Sabal who agreed to stop aspirin and continue Plavix,  Xarelto -Continue Xarelto 20 mg daily, Plavix 75 mg daily, fenofibrate 145 mg daily, pravastatin 20 mg daily  CAD -Patient has a history of NSTEMI in 08/2018 and is s/p DES to the LAD and left circumflex -Continue Xarelto, Plavix as above -Continue pravastatin 20 mg daily. Patient has been intolerant to higher-intensity statins in the past  Hypertension - Continue amlodipine 2.5 mg BID, carvedilol 3.125 mg BID, lisinopril 40 mg daily   Hyperlipidemia - LDL 96 this admission  - Continue fenofibrate 145 mg daily, pravastatin 20 mg daily. Patient reported being intolerant to higher intensity statins and PCSK9i in the past  - Patient has an appointment with the PharmD lipid clinic on 8/19  Lupus anticoagulant syndrome History of DVT and PE - Continue xarelto 20 mg daily    Patient was seen and examined by Dr. Jacques Navy and deemed stable for DC. Patient has a follow up appointment with Dr. Allyson Sabal on 04/10/23  Did the patient have an acute coronary syndrome (MI, NSTEMI, STEMI, etc) this admission?:  No                               Did the patient have a percutaneous coronary intervention (stent / angioplasty)?:  No.  _____________  Discharge Vitals Blood pressure (!) 152/72, pulse 86, temperature 97.9 F (36.6 C), temperature source Oral, resp. rate 18, height 5' 4.5" (1.638 m), weight 65.8 kg, SpO2 98%.  Filed Weights   03/19/23 0956  Weight: 65.8 kg    Labs & Radiologic Studies    CBC Recent Labs    03/20/23 0057 03/21/23 0427  WBC 8.9 7.9  HGB 11.4* 10.5*  HCT 34.4* 31.8*  MCV 89.8 91.1  PLT 400 362   Basic Metabolic Panel Recent Labs    16/10/96 0057  NA 138  K 3.5  CL 102  CO2 20*  GLUCOSE 97  BUN 6*  CREATININE 0.74  CALCIUM 8.7*   Liver Function Tests No results for input(s): "AST", "ALT", "ALKPHOS", "BILITOT", "PROT", "ALBUMIN" in the last 72 hours. No results for input(s): "LIPASE", "AMYLASE" in the last 72 hours. High Sensitivity Troponin:    No results for input(s): "TROPONINIHS" in the last 720 hours.  BNP Invalid input(s): "POCBNP" D-Dimer No results for input(s): "DDIMER" in the last 72 hours. Hemoglobin A1C No results for input(s): "HGBA1C" in the last 72 hours. Fasting Lipid Panel Recent Labs    03/20/23 0057  CHOL 177  HDL 31*  LDLCALC 96  TRIG 045*  CHOLHDL 5.7   Thyroid Function Tests No results for input(s): "TSH", "T4TOTAL", "T3FREE", "THYROIDAB" in the last 72 hours.  Invalid input(s): "FREET3" _____________  PERIPHERAL VASCULAR CATHETERIZATION  Result Date: 03/19/2023 Images from the original result were not included.  [id number] LOCATION:  FACILITY: [facility] PHYSICIAN: Nanetta Batty, M.D. [birth year] DATE OF PROCEDURE:  03/19/2023 DATE OF DISCHARGE: PV Angiogram/Intervention History obtained from chart review.[name] [name] is a 67 y.o.  mild to moderately overweight married Caucasian female mother of 1, grandmother of 2 grandchildren referred by Azalee Course, PA-C for peripheral vascular valuation.  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  I last saw her in the office 05/23/2022.  Her risk factors include ongoing tobacco abuse 1/2 pack/day, 11/17/3.  She is accompanied by her husband Thayer Ohm today.  Hypertension and hyperlipidemia.  She does have CAD status post LAD and circumflex intervention back in January 2020.  She is never had a stroke.  She does have lupus anticoagulant with extensive lower extremity DVT and pulmonary emboli status post endovascular therapy with stenting of her lower extremity venous vasculature as well as placement of an IVC filter.  She was on Coumadin for some time and transition to Xarelto.  When she was on triple therapy for her LAD and circumflex stent she had GI bleeding.  Aspirin was discontinued.  She was not tolerant to Plavix.  She does complain of bilateral calf claudication over the last year with Dopplers performed 02/27/2022  revealing a right ABI of 0.57, left of 0.52.  She had high-grade lesions in her distal right SFA and popliteal artery and an occluded left SFA.  I performed peripheral angiography intervention 06/02/2022.  I performed [facility] 1 directional arthrectomy followed by [facility] of 8 short segment CTO in the mid right SFA.  She also had a mid left SFA CTO.  She was discharged home the following day and was readmitted that Friday with pain in her left groin, CT showed hematoma, Doppler showed pseudoaneurysm.  She underwent surgical evacuation and correction of the pseudoaneurysm by Dr. Sherral Hammers.  She just saw Dr. Sherral Hammers in the office last week who noted that the incision was dry with some surrounding edema.  She  does have some paresthesias down the medial aspect of her left thigh.  Her claudication has resolved and her right SFA remained widely patent by duplex ultrasound 06/12/2022.  Since I saw her in November of last year she has had recurrent claudication over the last 3 to 4 weeks with Doppler study performed 02/16/2023 revealed a decline in her right ABI to 0.48 with an occlusion of her right popliteal artery.  She wishes to proceed with outpatient peripheral angiography and potential endovascular therapy.  Because of her lupus anticoagulant, and being on Xarelto, she will need a Lovenox bridge as she did with her prior procedure. Pre Procedure Diagnosis: Peripheral arterial disease Post Procedure Diagnosis: Peripheral arterial disease Operators: Dr. Nanetta Batty Procedures Performed:  1.  Ultrasound-guided left common femoral access  2.  Abdominal aortogram/bilateral iliac angiogram  3.  Contralateral access  4.  Chocolate balloon angioplasty followed by [facility] distal right SFA/popliteal artery             5.  Stent distal right SFA/right popliteal artery PROCEDURE DESCRIPTION: The patient was brought to the second floor [place] [facility] cath lab in the the postabsorptive state. She was premedicated with IV Versed and  fentanyl. Her left groin was prepped and shaved in usual sterile fashion. Xylocaine 1% was used for local anesthesia. A 5 French sheath was inserted into the left common femoral artery using standard Seldinger technique.  A 5 French pigtail catheters placed the distal abdominal aorta.  Distal abdominal aortography and bilateral iliac angiography were performed.  Following this contralateral access was obtained with a crossover catheter and endhole catheter.  Right lower extremity angiography runoff was performed using bolus chase, digital subtraction and step table technique.  Omnipaque dye was used for the entirety of the case (100 cc of contrast total to patient).  Retrograde with pressures monitored during the case.  Angiographic Data: 1: Abdominal aortogram-distal abdominal aorta was free of significant disease 2: Right lower extremity-there was a CTO in the distal right SFA and proximal right popliteal artery with three-vessel runoff   Ms. Waldvogel has early restenosis of her distal right SFA and popliteal artery status post directional atherectomy and DCB a year ago.  She has resting foot pain/critical ischemia.  Will proceed with attempt at PTA plus or minus stenting. Procedure Description: The 6 French sheath in the left common femoral artery was exchanged for a 6 French 45 cm catapulte sheath.  Patient received a total of 24,000's of heparin with an ACT of 275.  Omnipaque dye was used for the entirety of the intervention.  I was able to cross the CTO with a Viance CTO catheter along the 0.143 g shepherd wire.  Following that I performed PTA with a 4 mm x 120 mm long chocolate balloon.  Following this I performed DCB with a 5 mm x 150 mm long Impact Admiral balloon at nominal pressures for 2 minutes.  I then per performed stenting using a 5 mm x 120 mm long SUPERA self-expanding stent resulting in reduction of a total occlusion to 0% residual with excellent blood flow and preserved trifurcation.  The  patient received 3 to milligrams of clopidogrel at the end of the case. Final Impression: Successful PTA, DCB and stenting of a distal right SFA/proximal proximal right popliteal CTO in the setting of rest pain/critical ischemia.  Patient has lupus anticoagulant.  She was difficult to anticoagulate.  She will need "triple therapy including aspirin 81 mg, clopidogrel and Xarelto for 30 days  after which aspirin can be discontinued.  I am going to start her on low-dose heparin and will transition her to Xarelto tomorrow.  The sheath will be removed once ACT falls below 170 and pressure will be held.  Patient will be hydrated overnight.  Because of a prior groin bleed I anticipate discharge on Saturday. Nanetta Batty. MD, [facility] 03/19/2023 3:50 PM    Disposition   Pt is being discharged home today in good condition.  Follow-up Plans & Appointments     Discharge Instructions     Diet - low sodium heart healthy   Complete by: As directed    Discharge instructions   Complete by: As directed    Groin Site Care Refer to this sheet in the next few weeks. These instructions provide you with information on caring for yourself after your procedure. Your caregiver may also give you more specific instructions. Your treatment has been planned according to current medical practices, but problems sometimes occur. Call your caregiver if you have any problems or questions after your procedure. HOME CARE INSTRUCTIONS You may shower 24 hours after the procedure. Remove the bandage (dressing) and gently wash the site with plain soap and water. Gently pat the site dry.  Do not apply powder or lotion to the site.  Do not sit in a bathtub, swimming pool, or whirlpool for 5 to 7 days.  No bending, squatting, or lifting anything over 10 pounds (4.5 kg) as directed by your caregiver.  Inspect the site at least twice daily.  Do not drive home if you are discharged the same day of the procedure. Have someone else drive you.   You may drive 24 hours after the procedure unless otherwise instructed by your caregiver.  What to expect: Any bruising will usually fade within 1 to 2 weeks.  Blood that collects in the tissue (hematoma) may be painful to the touch. It should usually decrease in size and tenderness within 1 to 2 weeks.  SEEK IMMEDIATE MEDICAL CARE IF: You have unusual pain at the groin site or down the affected leg.  You have redness, warmth, swelling, or pain at the groin site.  You have drainage (other than a small amount of blood on the dressing).  You have chills.  You have a fever or persistent symptoms for more than 72 hours.  You have a fever and your symptoms suddenly get worse.  Your leg becomes pale, cool, tingly, or numb.  You have heavy bleeding from the site. Hold pressure on the site. .   Increase activity slowly   Complete by: As directed         Discharge Medications   Allergies as of 03/21/2023       Reactions   Prednisone Other (See Comments)   No energy "stalls out".    Makes pt  Jittery.   Tramadol Other (See Comments)   Constipation,  Makes pt  Jittery.   Crestor [rosuvastatin Calcium]    Feels fatigued, RE  CHALLENGE - UNABLE TO USE 12/28/18   Hctz [hydrochlorothiazide]    Dizzy   Repatha [evolocumab] Other (See Comments)   Per patient never took this medication        Medication List     STOP taking these medications    enoxaparin 100 MG/ML injection Commonly known as: LOVENOX       TAKE these medications    acetaminophen 650 MG CR tablet Commonly known as: TYLENOL Take 650 mg by mouth daily as needed  for pain.   amLODipine 2.5 MG tablet Commonly known as: NORVASC Take 1 tablet (2.5 mg total) by mouth 2 (two) times daily.   amoxicillin 500 MG tablet Commonly known as: AMOXIL Take 1 tablet (500 mg total) by mouth 2 (two) times daily for 7 days.   bisacodyl 5 MG EC tablet Commonly known as: DULCOLAX Take 5 mg by mouth daily as needed for moderate  constipation.   carvedilol 6.25 MG tablet Commonly known as: COREG TAKE 0.5 TABLETS (3.125 MG TOTAL) BY MOUTH 2 (TWO) TIMES DAILY WITH A MEAL.   cholecalciferol 25 MCG (1000 UNIT) tablet Commonly known as: VITAMIN D3 Take 1,000 Units by mouth daily.   clopidogrel 75 MG tablet Commonly known as: PLAVIX Take 1 tablet (75 mg total) by mouth daily with breakfast. Start taking on: March 22, 2023   fenofibrate 145 MG tablet Commonly known as: TRICOR TAKE 1 TABLET (145 MG TOTAL) BY MOUTH DAILY. TAKE WITH A MEAL.   lisinopril 40 MG tablet Commonly known as: ZESTRIL Take 1 tablet (40 mg total) by mouth daily.   nitroGLYCERIN 0.4 MG SL tablet Commonly known as: NITROSTAT Place 1 tablet (0.4 mg total) under the tongue every 5 (five) minutes as needed for up to 3 doses for chest pain.   pravastatin 20 MG tablet Commonly known as: PRAVACHOL TAKE 1 TABLET (20 MG TOTAL) BY MOUTH EVERY EVENING. OR AS DIRECTED   rivaroxaban 20 MG Tabs tablet Commonly known as: Xarelto Take 1 tablet (20 mg total) by mouth daily with supper.           Outstanding Labs/Studies     Duration of Discharge Encounter   Greater than 30 minutes including physician time.  Signed, Jonita Albee, PA-C 03/21/2023, 9:07 AM  Patient seen and examined with KJ PA-C.  Agree as above, with the following exceptions and changes as noted below.  Gen: NAD, CV: RRR, Lungs: clear, Abd: soft, Extrem: no edema, Neuro/Psych: alert and oriented x 3, normal mood and affect. All available labs, radiology testing, previous records reviewed. Stable for hospital discharge.   Parke Poisson, MD

## 2023-03-23 ENCOUNTER — Encounter: Payer: Self-pay | Admitting: Cardiovascular Disease

## 2023-03-30 ENCOUNTER — Encounter: Payer: Self-pay | Admitting: Cardiovascular Disease

## 2023-03-30 ENCOUNTER — Ambulatory Visit (HOSPITAL_COMMUNITY)
Admission: RE | Admit: 2023-03-30 | Discharge: 2023-03-30 | Disposition: A | Discharge status: Home / Self Care | Payer: Medicare Other | Source: Ambulatory Visit | Attending: Cardiovascular Disease | Admitting: Cardiovascular Disease

## 2023-03-30 DIAGNOSIS — I739 Peripheral vascular disease, unspecified: Secondary | ICD-10-CM | POA: Diagnosis not present

## 2023-03-30 LAB — VAS US ABI WITH/WO TBI
Left ABI: 0.75
Right ABI: 1.06

## 2023-04-06 ENCOUNTER — Encounter: Payer: Self-pay | Admitting: Pharmacist Clinician (PhC)/ Clinical Pharmacy Specialist

## 2023-04-06 ENCOUNTER — Ambulatory Visit: Payer: Medicare Other | Attending: Cardiology | Admitting: Pharmacist Clinician (PhC)/ Clinical Pharmacy Specialist

## 2023-04-06 ENCOUNTER — Telehealth: Payer: Self-pay | Admitting: Pharmacist Clinician (PhC)/ Clinical Pharmacy Specialist

## 2023-04-06 ENCOUNTER — Other Ambulatory Visit (HOSPITAL_COMMUNITY): Payer: Self-pay

## 2023-04-06 DIAGNOSIS — E7849 Other hyperlipidemia: Secondary | ICD-10-CM | POA: Diagnosis not present

## 2023-04-06 MED ORDER — PRAVASTATIN SODIUM 40 MG PO TABS: 40.0000 mg | ORAL_TABLET | Freq: Every evening | ORAL | 3 refills | Status: DC

## 2023-04-06 MED ORDER — VASCEPA 1 G PO CAPS: 1.0000 g | ORAL_CAPSULE | Freq: Two times a day (BID) | ORAL | 6 refills | Status: DC

## 2023-04-06 NOTE — Patient Instructions (Addendum)
Your Results:             Your most recent labs Goal  Total Cholesterol 177 < 200  Triglycerides 248 < 150  HDL (happy/good cholesterol) 31 > 40  LDL (lousy/bad cholesterol 96 < 55   Medication changes:  Increase the pravastatin to 40 mg three days a week, continue with the 20 mg on the other 4 days.  If that works well for you, after 3 weeks, increase to 40 mg each night.    Start icosapent ethyl (fish oil) - once capsule twice daily.  Keep the bottle in the refrigerator in order to avoid that fishy burp that can occur  Take your fenofibrate with breakfast each morning  Lab orders:  We want to repeat labs after 2-3 months.  We will send you a lab order to remind you once we get closer to that time.    Thank you for choosing [facility]   High Triglycerides Eating Plan Triglycerides are a type of fat in the blood. High levels of triglycerides can increase your risk of heart disease and stroke. If your triglyceride levels are high, choosing the right foods can help lower your triglycerides and keep your heart healthy. Work with your health care provider or a dietitian to develop an eating plan that is right for you. What are tips for following this plan? General guidelines  Lose weight, if you are overweight. For most people, losing 5-10 lb (2-5 kg) helps lower triglyceride levels. A weight-loss plan may include: 30 minutes of exercise at least 5 days a week. Reducing the amount of calories, sugar, and fat you eat. Eat a wide variety of fresh fruits, vegetables, and whole grains. These foods are high in fiber. Eat foods that contain healthy fats, such as fatty fish, nuts, seeds, and olive oil. Avoid foods that are high in added sugar, added salt (sodium), and saturated fat. Avoid low-fiber, refined carbohydrates such as white bread, crackers, noodles, and white rice. Avoid foods with trans fats or partially hydrogenated oils, such as fried foods or stick margarine. If you drink  alcohol: Limit how much you have to: 0-1 drink a day for women who are not pregnant. 0-2 drinks a day for men. Your health care provider may recommend that you drink less than these amounts depending on your overall health. Know how much alcohol is in a drink. In the [place] one drink equals one 12 oz bottle of beer (355 mL), one 5 oz glass of wine (148 mL), or one 1 oz glass of hard liquor (44 mL). Reading food labels Check food labels for: The amount of saturated fat. Choose foods with no or very little saturated fat (less than 2 g). The amount of trans fat. Choose foods with no transfat. The amount of cholesterol. Choose foods that are low in cholesterol. The amount of sodium. Choose foods with less than 140 milligrams (mg) per serving. Shopping Buy dairy products labeled as nonfat (skim) or low-fat (1%). Avoid buying processed or prepackaged foods. These are often high in added sugar, sodium, and fat. Cooking Choose healthy fats when cooking, such as olive oil, avocado oil, or canola oil. Cook foods using lower fat methods, such as baking, broiling, boiling, or grilling. Make your own sauces, dressings, and marinades when possible, instead of buying them. Store-bought sauces, dressings, and marinades are often high in sodium and sugar. Meal planning Eat more home-cooked food and less restaurant, buffet, and fast food. Eat fatty fish at least  2 times each week. Examples of fatty fish include salmon, trout, sardines, mackerel, tuna, and herring. If you eat whole eggs, do not eat more than 4 egg yolks per week. What foods should I eat? Fruits All fresh, canned (in natural juice), or frozen fruits. Vegetables Fresh or frozen vegetables. Low-sodium canned vegetables. Grains Whole wheat or whole grain breads, crackers, cereals, and pasta. Unsweetened oatmeal. Bulgur. Barley. Quinoa. Brown rice. Whole wheat flour tortillas. Meats and other proteins Skinless chicken or [place]. Ground  chicken or [place]. Lean cuts of pork, trimmed of fat. Fish and seafood, especially salmon, trout, and herring. Egg whites. Dried beans, peas, or lentils. Unsalted nuts or seeds. Unsalted canned beans. Natural peanut or almond butter or other nut butters. Dairy Low-fat dairy products. Skim or low-fat (1%) milk. Reduced fat (2%) and low-sodium cheese. Low-fat ricotta cheese. Low-fat cottage cheese. Plain, low-fat yogurt. Fats and oils Tub margarine without trans fats. Light or reduced-fat mayonnaise. Light or reduced-fat salad dressings. Avocado. Safflower, olive, sunflower, soybean, and canola oils. The items listed above may not be a complete list of recommended foods and beverages. Talk with your dietitian about what dietary choices are best for you. What foods should I avoid? Fruits Sweetened dried fruit. Canned fruit in syrup. Fruit juice. Vegetables Creamed or fried vegetables. Vegetables in a cheese sauce. Grains White bread. White (regular) pasta. White rice. Cornbread. Bagels. Pastries. Crackers that contain trans fat. Meats and other proteins Fatty cuts of meat. Ribs. Chicken wings. Tomasa Blase. Sausage. Bologna. Salami. Chitterlings. Fatback. Hot dogs. Bratwurst. Packaged lunch meats. Dairy Whole or reduced-fat (2%) milk. Half-and-half. Cream cheese. Full-fat or sweetened yogurt. Full-fat cheese. Nondairy creamers. Whipped toppings. Processed cheese or cheese spreads. Cheese curds. Fats and oils Butter. Stick margarine. Lard. Shortening. Ghee. Bacon fat. Tropical oils, such as coconut, palm kernel, or palm oils. Beverages Alcohol. Sweetened drinks, such as soda, lemonade, fruit drinks, or punches. Sweets and desserts Corn syrup. Sugars. Honey. Molasses. Candy. Jam and jelly. Syrup. Sweetened cereals. Cookies. Pies. Cakes. Donuts. Muffins. Ice cream. Condiments Store-bought sauces, dressings, and marinades that are high in sugar, such as ketchup and barbecue sauce. The items listed above  may not be a complete list of foods and beverages you should avoid. Talk with your dietitian about what dietary choices are best for you. Summary High levels of triglycerides can increase the risk of heart disease and stroke. Choosing the right foods can help lower your triglycerides. Eat plenty of fresh fruits, vegetables, and whole grains. Choose low-fat dairy and lean meats. Eat fatty fish at least twice a week. Avoid processed and prepackaged foods with added sugar, sodium, saturated fat, and trans fat. If you need suggestions or have questions about what types of food are good for you, talk with your health care provider or a dietitian. This information is not intended to replace advice given to you by your health care provider. Make sure you discuss any questions you have with your health care provider. Document Revised: 12/14/2020 Document Reviewed: 12/14/2020 Elsevier Patient Education  2024 [employer].

## 2023-04-06 NOTE — Assessment & Plan Note (Signed)
Assessment: Patient with ASCVD/familial hyperlipidemia not at LDL goal of < 55 Most recent LDL 96 on 03/20/23 Has been compliant with low intensity statin: pravastatin 20 mg Not able to tolerate higher intensity statins secondary to myalgias - has not pushed up dose of pravastatin in the past Triglycerides elevated on fenofibrate 145 mg.  Will have patient adjust time of day to take with food for better absorption.   Plan: Patient agreeable to increasing pravastatin.  Will start with 40 mg three times weekly, 20 mg other days.  If she tolerates this for 3 weeks, increase to 40 mg daily. Willing to re-challenge with icosapent ethyl, will start with 1 gm bid and advised to refrigerate to decrease likelihood of fishy aftertaste.   Repeat labs after:  3 months Lipid Liver function

## 2023-04-06 NOTE — Progress Notes (Unsigned)
Office Visit    Patient Name: [name] [name] Date of Encounter: 04/06/2023  Primary Care Provider:  Deeann Saint, MD Primary Cardiologist:  Bryan Lemma, MD  Chief Complaint    Hyperlipidemia - familial  Significant Past Medical History   PAD 10/23 Arthrectomy mid right SFA, mid left SFA CTO; 8/24 CTO distal R SFA and prox R popliteal - stents placed,  CAD 2020 NSTEMI w/2 vessel PCI  VTE Lupus anticoagulant - extensive lower extremity DVT, PE, IVC filter; on Xarelto 20  HTN On lisinopril 40, carvedilol 3.125 bid, amlodipine 2.5 bid        Allergies  Allergen Reactions   Prednisone Other (See Comments)    No energy "stalls out".    Makes pt  Jittery.   Tramadol Other (See Comments)    Constipation,  Makes pt  Jittery.   Crestor [Rosuvastatin Calcium]     Feels fatigued, RE  CHALLENGE - UNABLE TO USE 12/28/18   Hctz [Hydrochlorothiazide]     Dizzy    Repatha [Evolocumab] Other (See Comments)    Per patient never took this medication    History of Present Illness    [name] [name] is a 67 y.o. female patient of Dr Herbie Baltimore, in the office today to discuss options for cholesterol management.  She has a history of familial hyperlipidemia, with LDL at 202 in 2020.  Triglycerides have also been severely elevated in the past, well into the 700's.   Currently she is on pravastatin 20 mg and fenofibrate 145 mg.    Insurance Carrier: [employer]age  LDL Cholesterol goal:  LDL < 55  Current Medications:  fenofibrate 145 mg every day, pravastatin 20 mg every day    Previously tried:  atorvastatin 40,  pitavastatin 4, rosuvastatin 10, 40 - myalgias bempedoic acid 180 - not sure if she took this evolocumab 140 - never took icoaspent ethyl 2 gm bid, omega 3 acid ethyl esters 2 gm bid, - caused fishy aftertaste  Family Hx: does not recall anyone in family with elevated cholesterol, mother died just a few years ago at age 61  Social Hx: Tobacco: down to 3 cigarettes  per day, admits doesn't even smoke most of that, only takes a few puffs Alcohol:   no  Diet:  pescatarian, lots of shrimp,  loves pasta and breads, fresh vegetables in the summer months (has garden); no meats or eggs  Exercise:  started walking - will walk track at nearby highschool   Accessory Clinical Findings   Lab Results  Component Value Date   CHOL 177 03/20/2023   HDL 31 (L) 03/20/2023   LDLCALC 96 03/20/2023   LDLDIRECT 141.0 11/25/2018   TRIG 248 (H) 03/20/2023   CHOLHDL 5.7 03/20/2023    Lipoprotein (a)  Date/Time Value Ref Range Status  07/19/2020 11:22 AM 22.2 <75.0 nmol/L Final    Comment:    Note:  Values greater than or equal to 75.0 nmol/L may        indicate an independent risk factor for CHD,        but must be evaluated with caution when applied        to non-Caucasian populations due to the        influence of genetic factors on Lp(a) across        ethnicities.     Lab Results  Component Value Date   ALT 10 02/16/2023   AST 18 02/16/2023   ALKPHOS 73 02/16/2023  BILITOT 0.3 02/16/2023   Lab Results  Component Value Date   CREATININE 0.74 03/20/2023   BUN 6 (L) 03/20/2023   NA 138 03/20/2023   K 3.5 03/20/2023   CL 102 03/20/2023   CO2 20 (L) 03/20/2023   No results found for: "HGBA1C"  Home Medications    Current Outpatient Medications  Medication Sig Dispense Refill   acetaminophen (TYLENOL) 650 MG CR tablet Take 650 mg by mouth daily as needed for pain.     amLODipine (NORVASC) 2.5 MG tablet Take 1 tablet (2.5 mg total) by mouth 2 (two) times daily. 180 tablet 3   bisacodyl (DULCOLAX) 5 MG EC tablet Take 5 mg by mouth daily as needed for moderate constipation.     carvedilol (COREG) 6.25 MG tablet TAKE 0.5 TABLETS (3.125 MG TOTAL) BY MOUTH 2 (TWO) TIMES DAILY WITH A MEAL. 90 tablet 3   clopidogrel (PLAVIX) 75 MG tablet Take 1 tablet (75 mg total) by mouth daily with breakfast. 30 tablet 11   fenofibrate (TRICOR) 145 MG tablet TAKE 1  TABLET (145 MG TOTAL) BY MOUTH DAILY. TAKE WITH A MEAL. 90 tablet 3   lisinopril (ZESTRIL) 40 MG tablet Take 1 tablet (40 mg total) by mouth daily. 90 tablet 3   nitroGLYCERIN (NITROSTAT) 0.4 MG SL tablet Place 1 tablet (0.4 mg total) under the tongue every 5 (five) minutes as needed for up to 3 doses for chest pain. 25 tablet 5   pravastatin (PRAVACHOL) 20 MG tablet TAKE 1 TABLET (20 MG TOTAL) BY MOUTH EVERY EVENING. OR AS DIRECTED 90 tablet 2   rivaroxaban (XARELTO) 20 MG TABS tablet Take 1 tablet (20 mg total) by mouth daily with supper. 90 tablet 3   No current facility-administered medications for this visit.     Assessment & Plan    Familial hyperlipidemia Assessment: Patient with ASCVD/familial hyperlipidemia not at LDL goal of < 55 Most recent LDL 96 on 03/20/23 Has been compliant with low intensity statin: pravastatin 20 mg Not able to tolerate higher intensity statins secondary to myalgias - has not pushed up dose of pravastatin in the past Triglycerides elevated on fenofibrate 145 mg.  Will have patient adjust time of day to take with food for better absorption.   Plan: Patient agreeable to increasing pravastatin.  Will start with 40 mg three times weekly, 20 mg other days.  If she tolerates this for 3 weeks, increase to 40 mg daily. Willing to re-challenge with icosapent ethyl, will start with 1 gm bid and advised to refrigerate to decrease likelihood of fishy aftertaste.   Repeat labs after:  3 months Lipid Liver function    Phillips Hay, PharmD CPP [facility] [address]  [place], [place] [address] [phone]  04/06/2023, 2:10 PM

## 2023-04-06 NOTE — Telephone Encounter (Signed)
Pharmacy Patient Advocate Encounter   Received notification from Physician's Office that prior authorization for VASCEPA is required/requested.  Per test claim: The current 30 day co-pay is, $89.27 (PATIENT IN COVERAGE GAP (DONUT HOLE)) PLEASE SEND VASCEPA RX TO PHARMACY WITH A DAW1.  No PA needed at this time. This test claim was processed through [facility]- copay amounts may vary at other pharmacies due to pharmacy/plan contracts, or as the patient moves through the different stages of their insurance plan.

## 2023-04-06 NOTE — Telephone Encounter (Signed)
Please do PA for Vasceapa

## 2023-04-10 ENCOUNTER — Encounter: Payer: Self-pay | Admitting: Cardiovascular Disease

## 2023-04-10 ENCOUNTER — Ambulatory Visit: Payer: Medicare Other | Attending: Cardiovascular Disease | Admitting: Cardiovascular Disease

## 2023-04-10 VITALS — BP 128/68 | HR 93 | Ht 64.5 in | Wt 151.6 lb

## 2023-04-10 DIAGNOSIS — I10 Essential (primary) hypertension: Secondary | ICD-10-CM | POA: Diagnosis not present

## 2023-04-10 DIAGNOSIS — I739 Peripheral vascular disease, unspecified: Secondary | ICD-10-CM

## 2023-04-10 DIAGNOSIS — I214 Non-ST elevation (NSTEMI) myocardial infarction: Secondary | ICD-10-CM

## 2023-04-10 DIAGNOSIS — E7849 Other hyperlipidemia: Secondary | ICD-10-CM

## 2023-04-10 MED ORDER — NITROGLYCERIN 0.4 MG SL SUBL: 0.4000 mg | SUBLINGUAL_TABLET | SUBLINGUAL | 3 refills | Status: AC | PRN

## 2023-04-10 NOTE — Assessment & Plan Note (Signed)
History of peripheral arterial disease status post peripheral angiography 06/02/2022 with directional atherectomy, DCB of a short segment right SFA CTO.  Unfortunately her procedure was complicated by a fairly large groin hematoma with pseudoaneurysm which was repaired by Dr. Sherral Hammers.  She has had recurrent claudication with Dopplers besides 1/24 that showed a decline in her right ABI to 0.48 with what appears to be an occluded vessel.  She underwent peripheral angiography by myself 03/19/2023 with demonstration of a distal right SFA CTO which I was able to get across and perform stenting with a Supera  stent with an excellent angiographic result.  Her postprocedure Dopplers revealed normalization of her ABIs to 1.06.  Her claudication has resolved.  She still has some numbness in her toes which I think are unrelated.

## 2023-04-10 NOTE — Patient Instructions (Signed)
Medication Instructions:  NO CHANGES  *If you need a refill on your cardiac medications before your next appointment, please call your pharmacy*   Lab Work: FASTING lipid panel in 6 months  If you have labs (blood work) drawn today and your tests are completely normal, you will receive your results only by: MyChart Message (if you have MyChart) OR A paper copy in the mail If you have any lab test that is abnormal or we need to change your treatment, we will call you to review the results.   Testing/Procedures: Lower Extremity Arterial Doppler in 6 months   Follow-Up: At [facility], you and your health needs are our priority.  As part of our continuing mission to provide you with exceptional heart care, we have created designated Provider Care Teams.  These Care Teams include your primary Cardiologist (physician) and Advanced Practice Providers (APPs -  Physician Assistants and Nurse Practitioners) who all work together to provide you with the care you need, when you need it.  We recommend signing up for the patient portal called "MyChart".  Sign up information is provided on this After Visit Summary.  MyChart is used to connect with patients for Virtual Visits (Telemedicine).  Patients are able to view lab/test results, encounter notes, upcoming appointments, etc.  Non-urgent messages can be sent to your provider as well.   To learn more about what you can do with MyChart, go to [email or web address].    Your next appointment:   6 months with Dr. Allyson Sabal

## 2023-04-10 NOTE — Progress Notes (Signed)
04/10/2023 [name] [name]   1956-07-19  [id number]  Primary Physician Deeann Saint, MD Primary Cardiologist: Runell Gess MD Milagros Loll, [place], [place]  HPI:  [name] [name] is a 67 y.o.   mild to moderately overweight married Caucasian female mother of 1, grandmother of 2 grandchildren referred by Azalee Course, PA-C for peripheral vascular valuation.  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  I last saw her in the office 02/20/2023.  Her risk factors include ongoing tobacco abuse 1/2 pack/day, 11/17/3.  She is accompanied by her husband Thayer Ohm today.  Hypertension and hyperlipidemia.  She does have CAD status post LAD and circumflex intervention back in January 2020.  She is never had a stroke.  She does have lupus anticoagulant with extensive lower extremity DVT and pulmonary emboli status post endovascular therapy with stenting of her lower extremity venous vasculature as well as placement of an IVC filter.  She was on Coumadin for some time and transition to Xarelto.  When she was on triple therapy for her LAD and circumflex stent she had GI bleeding.  Aspirin was discontinued.  She was not tolerant to Plavix.  She does complain of bilateral calf claudication over the last year with Dopplers performed 02/27/2022 revealing a right ABI of 0.57, left of 0.52.  She had high-grade lesions in her distal right SFA and popliteal artery and an occluded left SFA.   I performed peripheral angiography intervention 06/02/2022.  I performed [facility] 1 directional arthrectomy followed by [facility] of 8 short segment CTO in the mid right SFA.  She also had a mid left SFA CTO.  She was discharged home the following day and was readmitted that Friday with pain in her left groin, CT showed hematoma, Doppler showed pseudoaneurysm.  She underwent surgical evacuation and correction of the pseudoaneurysm by Dr. Sherral Hammers.  She just saw Dr. Sherral Hammers in the office last week who  noted that the incision was dry with some surrounding edema.  She does have some paresthesias down the medial aspect of her left thigh.  Her claudication has resolved and her right SFA remained widely patent by duplex ultrasound 06/12/2022.   When I saw her on July 5 of this year she was complaining of recurrent claudication over the last 3 to 4 weeks with Doppler study performed 02/16/2023 revealed a decline in her right ABI to 0.48 with an occlusion of her right popliteal artery.  Our plans are to proceed with angiography and reintervention.  The major issue is going to be Lovenox bridging and careful attention to her left groin given her previous pseudoaneurysm formation.  I performed peripheral angiography on her 03/19/2023 revealing a distal right SFA CTO.  I performed PTA and stenting using a SUPERA self-expanding stent.  She was discharged home 2 days later.  Her groin is remained stable.  Follow-up Doppler studies performed 03/30/2023 revealed marked improvement in her ABI on the right up to 1.06 with a widely patent stent.  Her claudication has resolved.  Current Meds  Medication Sig   acetaminophen (TYLENOL) 650 MG CR tablet Take 650 mg by mouth daily as needed for pain.   amLODipine (NORVASC) 2.5 MG tablet Take 1 tablet (2.5 mg total) by mouth 2 (two) times daily.   bisacodyl (DULCOLAX) 5 MG EC tablet Take 5 mg by mouth daily as needed for moderate constipation.   carvedilol (COREG) 6.25 MG tablet TAKE 0.5 TABLETS (3.125 MG  TOTAL) BY MOUTH 2 (TWO) TIMES DAILY WITH A MEAL.   clopidogrel (PLAVIX) 75 MG tablet Take 1 tablet (75 mg total) by mouth daily with breakfast.   fenofibrate (TRICOR) 145 MG tablet TAKE 1 TABLET (145 MG TOTAL) BY MOUTH DAILY. TAKE WITH A MEAL.   lisinopril (ZESTRIL) 40 MG tablet Take 1 tablet (40 mg total) by mouth daily.   pravastatin (PRAVACHOL) 40 MG tablet Take 1 tablet (40 mg total) by mouth every evening.   rivaroxaban (XARELTO) 20 MG TABS tablet Take 1 tablet (20 mg  total) by mouth daily with supper.   VASCEPA 1 g capsule Take 1 capsule (1 g total) by mouth 2 (two) times daily.   [DISCONTINUED] nitroGLYCERIN (NITROSTAT) 0.4 MG SL tablet Place 1 tablet (0.4 mg total) under the tongue every 5 (five) minutes as needed for up to 3 doses for chest pain.     Allergies  Allergen Reactions   Prednisone Other (See Comments)    No energy "stalls out".    Makes pt  Jittery.   Tramadol Other (See Comments)    Constipation,  Makes pt  Jittery.   Crestor [Rosuvastatin Calcium]     Feels fatigued, RE  CHALLENGE - UNABLE TO USE 12/28/18   Hctz [Hydrochlorothiazide]     Dizzy    Repatha [Evolocumab] Other (See Comments)    Per patient never took this medication    Social History   Socioeconomic History   Marital status: Married    Spouse name: Not on file   Number of children: 2   Years of education: Not on file   Highest education level: Not on file  Occupational History   Occupation: SERVICE MANAGER    Employer: [employer]  Tobacco Use   Smoking status: Every Day    Current packs/day: 0.00    Average packs/day: 0.5 packs/day for 29.0 years (14.5 ttl pk-yrs)    Types: Cigarettes    Start date: 05/1993    Last attempt to quit: 05/2022    Years since quitting: 0.8   Smokeless tobacco: Never   Tobacco comments:    Cut back to quarter pack a day  Vaping Use   Vaping status: Never Used  Substance and Sexual Activity   Alcohol use: Yes    Comment: Occasional   Drug use: No   Sexual activity: Not on file  Other Topics Concern   Not on file  Social History Narrative   Really is a married mother of 2.  1 son died from complications of spina bifida after multiple surgeries.   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     She has a has a Scientist, water quality in education.     Drinks social alcohol & denies drug use.     Pt endorses smoking maybe 2 cigarettes/day.  States she placed most of the cigarette, may have 2  puffs.   Social Determinants of Health   Financial Resource Strain: Low Risk  (10/27/2022)   Overall Financial Resource Strain (CARDIA)    Difficulty of Paying Living Expenses: Not hard at all  Food Insecurity: No Food Insecurity (10/27/2022)   Hunger Vital Sign    Worried About Running Out of Food in the Last Year: Never true    Ran Out of Food in the Last Year: Never true  Transportation Needs: No Transportation Needs (10/27/2022)   PRAPARE - Administrator, Civil Service (Medical): No    Lack  of Transportation (Non-Medical): No  Physical Activity: Inactive (10/27/2022)   Exercise Vital Sign    Days of Exercise per Week: 0 days    Minutes of Exercise per Session: 0 min  Stress: No Stress Concern Present (10/27/2022)   [employer] of Occupational Health - Occupational Stress Questionnaire    Feeling of Stress : Not at all  Social Connections: Socially Integrated (10/27/2022)   Social Connection and Isolation Panel [NHANES]    Frequency of Communication with Friends and Family: More than three times a week    Frequency of Social Gatherings with Friends and Family: More than three times a week    Attends Religious Services: More than 4 times per year    Active Member of [employer] or Organizations: Yes    Attends Engineer, structural: More than 4 times per year    Marital Status: Married  Catering manager Violence: Not At Risk (10/27/2022)   Humiliation, Afraid, Rape, and Kick questionnaire    Fear of Current or Ex-Partner: No    Emotionally Abused: No    Physically Abused: No    Sexually Abused: No     Review of Systems: General: negative for chills, fever, night sweats or weight changes.  Cardiovascular: negative for chest pain, dyspnea on exertion, edema, orthopnea, palpitations, paroxysmal nocturnal dyspnea or shortness of breath Dermatological: negative for rash Respiratory: negative for cough or wheezing Urologic: negative for hematuria Abdominal:  negative for nausea, vomiting, diarrhea, bright red blood per rectum, melena, or hematemesis Neurologic: negative for visual changes, syncope, or dizziness All other systems reviewed and are otherwise negative except as noted above.    Blood pressure 128/68, pulse 93, height 5' 4.5" (1.638 m), weight 151 lb 9.6 oz (68.8 kg), SpO2 97%.  General appearance: alert and no distress Neck: no adenopathy, no carotid bruit, no JVD, supple, symmetrical, trachea midline, and thyroid not enlarged, symmetric, no tenderness/mass/nodules Lungs: clear to auscultation bilaterally Heart: Regular rate and rhythm without murmurs gallops rubs or clicks Extremities: extremities normal, atraumatic, no cyanosis or edema Pulses: 2+ and symmetric Skin: Skin color, texture, turgor normal. No rashes or lesions Neurologic: Grossly normal  EKG EKG Interpretation Date/Time:  Friday April 10 2023 14:21:22 EDT Ventricular Rate:  94 PR Interval:  180 QRS Duration:  66 QT Interval:  342 QTC Calculation: 427 R Axis:   79  Text Interpretation: Normal sinus rhythm Septal infarct , age undetermined When compared with ECG of 03-Feb-2022 01:29, Premature ventricular complexes are no longer Present Confirmed by Nanetta Batty [id number]) on 04/10/2023 3:02:21 PM    ASSESSMENT AND PLAN:   PAD (peripheral artery disease) (HCC) History of peripheral arterial disease status post peripheral angiography 06/02/2022 with directional atherectomy, DCB of a short segment right SFA CTO.  Unfortunately her procedure was complicated by a fairly large groin hematoma with pseudoaneurysm which was repaired by Dr. Sherral Hammers.  She has had recurrent claudication with Dopplers besides 1/24 that showed a decline in her right ABI to 0.48 with what appears to be an occluded vessel.  She underwent peripheral angiography by myself 03/19/2023 with demonstration of a distal right SFA CTO which I was able to get across and perform stenting with a Supera  stent  with an excellent angiographic result.  Her postprocedure Dopplers revealed normalization of her ABIs to 1.06.  Her claudication has resolved.  She still has some numbness in her toes which I think are unrelated.     Runell Gess MD FACP,FACC,FAHA, [facility] 04/10/2023 3:15 PM

## 2023-04-13 ENCOUNTER — Other Ambulatory Visit: Payer: Self-pay | Admitting: Pharmacist Clinician (PhC)/ Clinical Pharmacy Specialist

## 2023-04-13 MED ORDER — CLOPIDOGREL BISULFATE 75 MG PO TABS: 75.0000 mg | ORAL_TABLET | Freq: Every day | ORAL | 2 refills | Status: DC

## 2023-05-14 ENCOUNTER — Other Ambulatory Visit: Payer: Self-pay

## 2023-06-01 ENCOUNTER — Ambulatory Visit (INDEPENDENT_AMBULATORY_CARE_PROVIDER_SITE_OTHER): Payer: Medicare Other | Admitting: Family Medicine

## 2023-06-01 VITALS — BP 160/75 | HR 91 | Temp 98.6°F | Ht 64.5 in | Wt 150.6 lb

## 2023-06-01 DIAGNOSIS — Z23 Encounter for immunization: Secondary | ICD-10-CM

## 2023-06-01 DIAGNOSIS — Z029 Encounter for administrative examinations, unspecified: Secondary | ICD-10-CM | POA: Diagnosis not present

## 2023-06-01 DIAGNOSIS — Z720 Tobacco use: Secondary | ICD-10-CM

## 2023-06-01 DIAGNOSIS — I739 Peripheral vascular disease, unspecified: Secondary | ICD-10-CM

## 2023-06-01 DIAGNOSIS — E782 Mixed hyperlipidemia: Secondary | ICD-10-CM | POA: Diagnosis not present

## 2023-06-01 DIAGNOSIS — D6862 Lupus anticoagulant syndrome: Secondary | ICD-10-CM | POA: Diagnosis not present

## 2023-06-01 NOTE — Progress Notes (Unsigned)
Established Patient Office Visit   Subjective  Patient ID: [name] [name], female    DOB: 01-14-1956  Age: 67 y.o. MRN: [id number]  Chief Complaint  Patient presents with   Medical Management of Chronic Issues    Pt is a 67 yo female with pmh sig for PAD with claudication, CAD s/p PCI, DVT, PE, lupus anticoagulant syndrome on long-term anticoagulation, tobacco use, HLD, statin intolerance who was seen for administrative encounter.  Pt requesting permanent handicap placard completion.  Endorses difficulty walking for extended.  Due to history of vascular disease causing claudication.  Also with right knee pain.  Advised would need TKR however surgeon declines due to patient's history of DVT, PE, and lupus anticoagulat syndrome.  Unable to do had 5-year handicap placards in the past.  Requesting permanent tag and plaque for 4 additional vehicle.  Patient states she is doing well.  Had vascular procedure for stent placement on RLE without issue.  In the past developed pseudoaneurysm and hematoma in left LE after procedure.  Stable on blood thinners.  Patient continues to smoke cigarettes.  Had follow-up with cardiology.  Pt states she was advised by pharmacist Vascepa along with Plavix and so could significantly increase bleeding risk.  Patient now hesitant to take Vascepa.  Triglycerides 248, HDL 31 on 03/20/2023.    Patient Active Problem List   Diagnosis Date Noted   Pseudoaneurysm of left femoral artery (HCC) 06/06/2022   Claudication in peripheral vascular disease (HCC) 06/02/2022   Myalgia due to statin 03/25/2022   PAD (peripheral artery disease) (HCC) 03/21/2022   Coronary artery disease involving native heart without angina pectoris 05/28/2021   Statin myopathy 05/10/2021   Encounter for smoking cessation counseling 04/20/2020   Accelerated hypertension 05/29/2019   Lupus anticoagulant syndrome (HCC) 05/29/2019   Antiplatelet or antithrombotic long-term use    Acute on chronic anemia  09/28/2018   CAD -S/P PCI 09/24/2018   Non-ST elevation (NSTEMI) myocardial infarction [facility])    History of pulmonary embolus (PE) 09/12/2018   Microcytic anemia 01/24/2016   Hypertriglyceridemia 01/24/2016   Familial hyperlipidemia 06/21/2015   History of DVT of lower extremity    Edema of left lower extremity    Hypercoagulation syndrome (HCC) 05/19/2012   Iron deficiency 05/19/2012   Thrombocytosis 05/19/2012   Acute blood loss anemia 05/18/2012   Sinus tachycardia 04/21/2012   Hypokalemia 04/20/2012   Tobacco use 04/20/2012   Essential hypertension 04/20/2012   Past Medical History:  Diagnosis Date   Back pain    CAD S/P percutaneous coronary angioplasty 09/13/2018   09/13/2018 - Cath & Staged DES PCI on 09/14/2018: DES PCI: Cx99%&55% - Resolute Onyx DES 3.5 x 30 (3.6 mm), p-mLAD (prox of D1 almost to D2) - Resolute Onyx DES 3.0 x 22 (3.3 mm); DFR on pRCA 70% - 0.99, Not significant -> Rec Med Rx.   DVT (deep venous thrombosis) (HCC)    Heart attack (HCC) 09/09/2018   Hypertension    Lupus anticoagulant syndrome (HCC)    NSTEMI (non-ST elevated myocardial infarction) (HCC) 09/12/2018   Initial presentation with chest pain was on 09/09/2018, recurrent pain on 1/26 2020 with positive troponins.  Cath showed three-vessel CAD -> declined CABG, opted for two-vessel DES PCI (LAD and CX, negative DFR RCA)   Pulmonary embolism (HCC)    Past Surgical History:  Procedure Laterality Date   ABDOMINAL AORTOGRAM W/LOWER EXTREMITY N/A 06/02/2022   Procedure: ABDOMINAL AORTOGRAM W/LOWER EXTREMITY;  Surgeon: Runell Gess, MD;  Location:  [facility] INVASIVE CV LAB;  Service: Cardiovascular;  Laterality: N/A;   ABDOMINAL AORTOGRAM W/LOWER EXTREMITY N/A 03/19/2023   Procedure: ABDOMINAL AORTOGRAM W/LOWER EXTREMITY;  Surgeon: Runell Gess, MD;  Location: [facility]ASIVE CV LAB;  Service: Cardiovascular;  Laterality: N/A;   APPLICATION OF WOUND VAC Left 06/06/2022   Procedure: APPLICATION OF WOUND VAC  LEFT GROIN;  Surgeon: Victorino Sparrow, MD;  Location: [facility] OR;  Service: Vascular;  Laterality: Left;   ARTERY REPAIR Left 06/06/2022   Procedure: LEFT COMMON FEMORAL ARTERY REPAIR WITH MORCELLS;  Surgeon: Victorino Sparrow, MD;  Location: [facility] OR;  Service: Vascular;  Laterality: Left;   BACK SURGERY     COLONOSCOPY  05/19/2012   Procedure: COLONOSCOPY;  Surgeon: Theda Belfast, MD;  Location: [facility] ENDOSCOPY;  Service: Endoscopy;  Laterality: N/A;   COLONOSCOPY WITH PROPOFOL N/A 09/30/2018   Procedure: COLONOSCOPY WITH PROPOFOL;  Surgeon: Benancio Deeds, MD;  Location: [facility] ENDOSCOPY;  Service: Gastroenterology;  Laterality: N/A;   CORONARY PRESSURE/FFR STUDY N/A 09/14/2018   Procedure: INTRAVASCULAR PRESSURE WIRE/FFR STUDY;  Surgeon: Swaziland, Peter M, MD;  Location: [facility] INVASIVE CV LAB;  Service: Cardiovascular;  Laterality: RCA: DFR on pRCA 70% - 0.99, Not significant -> Rec Med Rx.   CORONARY STENT INTERVENTION N/A 09/14/2018   Procedure: CORONARY STENT INTERVENTION;  Surgeon: Swaziland, Peter M, MD;  Location: [facility] INVASIVE CV LAB;  Service: Cardiovascular: September 14, 2018- DES PCI: Cx99%&55% - Resolute Onyx DES 3.5 x 30 (3.6 mm), p-mLAD (prox of D1 almost to D2) - Resolute Onyx DES 3.0 x 22 (3.3 mm); DFR on pRCA 70% - 0.99, Not significant -> Rec Med Rx.   ESOPHAGOGASTRODUODENOSCOPY  05/19/2012   Procedure: ESOPHAGOGASTRODUODENOSCOPY (EGD);  Surgeon: Theda Belfast, MD;  Location: [facility] ENDOSCOPY;  Service: Endoscopy;  Laterality: N/A;   ESOPHAGOGASTRODUODENOSCOPY (EGD) WITH PROPOFOL N/A 09/30/2018   Procedure: ESOPHAGOGASTRODUODENOSCOPY (EGD) WITH PROPOFOL;  Surgeon: Benancio Deeds, MD;  Location: [facility] ENDOSCOPY;  Service: Gastroenterology;  Laterality: N/A;   GIVENS CAPSULE STUDY N/A 09/30/2018   Procedure: GIVENS CAPSULE STUDY;  Surgeon: Benancio Deeds, MD;  Location: [facility] ENDOSCOPY;  Service: Gastroenterology;  Laterality: N/A;   HEMATOMA EVACUATION Left 06/06/2022   Procedure: EVACUATION HEMATOMA  LEFT GROIN;  Surgeon: Victorino Sparrow, MD;  Location: [facility] OR;  Service: Vascular;  Laterality: Left;   HOT HEMOSTASIS N/A 09/30/2018   Procedure: HOT HEMOSTASIS (ARGON PLASMA COAGULATION/BICAP);  Surgeon: Benancio Deeds, MD;  Location: [facility] ENDOSCOPY;  Service: Gastroenterology;  Laterality: N/A;   LEFT HEART CATH AND CORONARY ANGIOGRAPHY N/A 09/13/2018   Procedure: LEFT HEART CATH AND CORONARY ANGIOGRAPHY;  Surgeon: Marykay Lex, MD;  Location: [facility] INVASIVE CV LAB;  Service: Cardiovascular:: CULPRIT LESION 99% p-mCx followed by 55%mCx; pLAD 35% A D70followed by long 80% lesion @ SP1); pRCA 70%. Normal LVEDP. Global HK EF ~45%.  -Decision on PCI deferred until discussion about PCI versus CABG.  Concern because of long-term DOAC.   PERIPHERAL VASCULAR ATHERECTOMY  06/02/2022   Procedure: PERIPHERAL VASCULAR ATHERECTOMY;  Surgeon: Runell Gess, MD;  Location: [facility] INVASIVE CV LAB;  Service: Cardiovascular;;   PERIPHERAL VASCULAR BALLOON ANGIOPLASTY  06/02/2022   Procedure: PERIPHERAL VASCULAR BALLOON ANGIOPLASTY;  Surgeon: Runell Gess, MD;  Location: [facility]VASIVE CV LAB;  Service: Cardiovascular;;   TRANSTHORACIC ECHOCARDIOGRAM  09/13/2018   Mildly reduced EF 45 to 50% with diffuse HK.  GR 1 DD.  Mild aortic valve calcification.  MAC.  Moderate pulmonic regurgitation.   WOUND EXPLORATION  Left 06/06/2022   Procedure: LEFT GROIN EXPLORATION;  Surgeon: Victorino Sparrow, MD;  Location: [facility] OR;  Service: Vascular;  Laterality: Left;   Social History   Tobacco Use   Smoking status: Every Day    Current packs/day: 0.00    Average packs/day: 0.5 packs/day for 29.0 years (14.5 ttl pk-yrs)    Types: Cigarettes    Start date: 05/1993    Last attempt to quit: 05/2022    Years since quitting: 1.0   Smokeless tobacco: Never   Tobacco comments:    Cut back to quarter pack a day  Vaping Use   Vaping status: Never Used  Substance Use Topics   Alcohol use: Yes    Comment: Occasional   Drug  use: No   Family History  Problem Relation Age of Onset   Leukemia Mother    Prostate cancer Father    Cancer Father    Stroke Maternal Grandmother    Lung cancer Maternal Grandfather    Leukemia Paternal Grandmother    Cancer Paternal Grandfather    Spina bifida Son        Multiple surgeries   Colon cancer Neg Hx    Esophageal cancer Neg Hx    Allergies  Allergen Reactions   Prednisone Other (See Comments)    No energy "stalls out".    Makes pt  Jittery.  **makes pt have no energy**   Tramadol Other (See Comments) and Nausea Only    Constipation,  Makes pt  Jittery.  constipation   Crestor [Rosuvastatin Calcium]     Feels fatigued, RE  CHALLENGE - UNABLE TO USE 12/28/18   Hctz [Hydrochlorothiazide]     Dizzy    Repatha [Evolocumab] Other (See Comments)    Per patient never took this medication      ROS Negative unless stated above    Objective:     BP (!) 160/75   Pulse 91   Temp 98.6 F (37 C) (Oral)   Ht 5' 4.5" (1.638 m)   Wt 150 lb 9.6 oz (68.3 kg)   SpO2 98%   BMI 25.45 kg/m  BP Readings from Last 3 Encounters:  06/01/23 (!) 160/75  04/10/23 128/68  03/21/23 (!) 152/72   Wt Readings from Last 3 Encounters:  06/01/23 150 lb 9.6 oz (68.3 kg)  04/10/23 151 lb 9.6 oz (68.8 kg)  03/19/23 145 lb (65.8 kg)      Physical Exam Constitutional:      General: She is not in acute distress.    Appearance: Normal appearance.  HENT:     Head: Normocephalic and atraumatic.     Nose: Nose normal.     Mouth/Throat:     Mouth: Mucous membranes are moist.  Eyes:     Extraocular Movements: Extraocular movements intact.     Conjunctiva/sclera: Conjunctivae normal.  Cardiovascular:     Rate and Rhythm: Normal rate and regular rhythm.  Pulmonary:     Effort: Pulmonary effort is normal.  Skin:    General: Skin is warm and dry.  Neurological:     Mental Status: She is alert and oriented to person, place, and time. Mental status is at baseline.    No  results found for any visits on 06/01/23.    Assessment & Plan:  Administrative encounter  PAD (peripheral artery disease) (HCC) -stable -continue f/u with Cards and Vascular  Need for influenza vaccination -     Flu Vaccine Trivalent High Dose (Fluad); Future  Mixed  hyperlipidemia -Cholesterol improving though triglycerides remain elevated at 248 and HDL 31 on 03/20/2023. -Continue lifestyle modifications -Acknowledged increased bleeding risk associated with Vascepa and current anticoag.  Discussed r/b/a.  Consider holding Vascepa and OTC fish oil though the risk still remains. -Encouraged to discuss with cardiology  Lupus anticoagulant syndrome (HCC) -Continue current medication regimen status post procedure including Plavix and Xarelto.  Tobacco use -Smoking cessation counseling greater than 3 minutes, less than 10 minutes -Patient is not ready to quit. -Discussed cutting down. -Continue to offer quit aids and assess readiness at each visit.  Handicap placard form completed.  Influenza vaccine given.  Clinic lab closed 2/2 staffing issues.  Patient to have labs done next week at cardiology office.  Return in about 3 months (around 09/01/2023), or if symptoms worsen or fail to improve.   Deeann Saint, MD

## 2023-06-04 NOTE — Addendum Note (Signed)
Addended by: Abbe Amsterdam R on: 06/04/2023 01:13 PM   Modules accepted: Level of Service

## 2023-06-08 ENCOUNTER — Other Ambulatory Visit: Payer: Self-pay | Admitting: Family Medicine

## 2023-06-08 ENCOUNTER — Telehealth: Payer: Self-pay

## 2023-06-08 DIAGNOSIS — Z1231 Encounter for screening mammogram for malignant neoplasm of breast: Secondary | ICD-10-CM

## 2023-06-08 NOTE — Patient Outreach (Signed)
Successful call to patient on today regarding preventative mammogram screening. Patient scheduled with mammogram bus at [facility] on 06/19/23 at 10:40 a.m.   Baruch Gouty [facility] Assistant [facility] Population Health [phone]

## 2023-06-19 ENCOUNTER — Ambulatory Visit
Admission: RE | Admit: 2023-06-19 | Discharge: 2023-06-19 | Disposition: A | Discharge status: Home / Self Care | Payer: Medicare Other | Source: Ambulatory Visit | Attending: Family Medicine | Admitting: Family Medicine

## 2023-06-19 DIAGNOSIS — Z1231 Encounter for screening mammogram for malignant neoplasm of breast: Secondary | ICD-10-CM

## 2023-06-30 ENCOUNTER — Encounter: Payer: Self-pay | Admitting: Pharmacist Clinician (PhC)/ Clinical Pharmacy Specialist

## 2023-07-08 ENCOUNTER — Other Ambulatory Visit: Payer: Self-pay | Admitting: Internal Medicine

## 2023-08-07 ENCOUNTER — Other Ambulatory Visit: Payer: Self-pay | Admitting: Cardiology

## 2023-08-31 ENCOUNTER — Ambulatory Visit: Payer: Medicare Other | Admitting: Cardiovascular Disease

## 2023-09-05 ENCOUNTER — Other Ambulatory Visit: Payer: Self-pay | Admitting: Cardiology

## 2023-09-07 NOTE — Telephone Encounter (Signed)
Prescription refill request for Xarelto received.  Indication:pad Last office visit:8/24 Weight:68.3  kg Age:68 Scr:0.74  8/24 CrCl:78.45  ml/min  Prescription refilled

## 2023-09-11 ENCOUNTER — Telehealth: Payer: Self-pay | Admitting: Cardiology

## 2023-09-11 NOTE — Telephone Encounter (Signed)
Pt c/o medication issue:  1. Name of Medication: XARELTO 20 MG TABS tablet   2. How are you currently taking this medication (dosage and times per day)? As written  3. Are you having a reaction (difficulty breathing--STAT)? no  4. What is your medication issue? Pt said [employer] is not going to pay for this medication in 2025. She cannot afford it and wants to be put on something else.  Pt is out of medication.

## 2023-09-11 NOTE — Telephone Encounter (Signed)
She will need to decide what she wants to do Xarelto is in the cost more but not as much probably is Eliquis.  There are options such as Pradaxa and Savaysa that she could look into.  Otherwise doing generic would be going to warfarin and I do not think she wants to do that.  Bryan Lemma, MD

## 2023-09-11 NOTE — Telephone Encounter (Signed)
Patient identification verified by 2 forms. Marilynn Rail, RN    Called and spoke to patient  Relayed message per pharmacy  Patient states:   -was not told about deductible by pharmacy   -call insurance today and was informed $250 deductible for Xarelto only   [employer] did not specify if this was a one time deductible or what cost of medication will be after deductible is covered   -would like to know if she can get a different prescription   -Warfarin would be a cheaper option  Advised patient:   [employer] insurance to review plan/deductible   -clarify with insurance what cost of Rx will be after deductible is met   -even if a new prescription is sent for different Rx, there could still be a deductible issue   -Warfarin might be a cheaper out of pocket cost additional copayment could be needed for frequent labs and office visits to monitor levels  Patient verbalized understanding, states she will contact insurnace and clarify and then outreach [facility] for prescription  Patient has no further questions at this time

## 2023-09-11 NOTE — Telephone Encounter (Signed)
Tylene Fantasia, RPH  You2 minutes ago (3:57 PM)    Please provide 2 weeks of Xarelto 20 mg samples - dose is appropriate.  Here are some alternatives   Eliquis: You can apply for patient assistance through [employer] patient assistance. The website for the application is [email or web address] The website can tell you if you meet the income requirement. Note to Medicare Patients: In addition to your application, you will need to submit documentation showing you have spent 3% of your annual household income on out-of-pocket prescription expenses for you and/or other members of your household for the year in which you are seeking assistance. Your pharmacy can provide this report.   Dabigatran: Dabigatran is the generic of Pradaxa (it recently went generic). Like Eliquis and Xarelto, it does not require a lot of monitoring. You might consider contacting your insurance to see what the cost of this medication is through your insurance. There is also a GoodRx coupon for ~$60 per month if your insurance does not cover it well.  Warfarin: Warfarin is very inexpensive but does require more monitoring. You will need to come to clinic for weekly INR (blood test) checks with our Anticoagulation Clinic for the first month. These visits may have a copay. Then as we find a stable dose for you, you will start to come less often. You will be required to have your INR checked at least every 6 weeks. There are also more drug interactions and dietary interactions with warfarin.

## 2023-09-11 NOTE — Telephone Encounter (Signed)
Called and spoke to [facility] pharmacy  Pharmacy states:   -Xarelto cost is $302   -This cost is due to deductible   -they are not certain if cost will reduce after $302 is paid   -can only give partial prescription if patient guaranteed to pay remaining balance  Informed Pharmacy nursing will outreach and review with patient

## 2023-09-14 NOTE — Telephone Encounter (Signed)
Patient identification verified by 2 forms. Marilynn Rail, RN    Called and spoke to patient  Patient states:   -she received clarification from insurance   -purchased the 30day supply of Xarelto fpr $302   -cost of Xarelto moving forward will be $47/month   -she is okay with continuing with this prescription at this time  Patient has no further questions, call disconnected by patient

## 2023-09-21 ENCOUNTER — Ambulatory Visit: Payer: Medicare Other | Attending: Cardiovascular Disease | Admitting: Cardiovascular Disease

## 2023-09-21 ENCOUNTER — Encounter: Payer: Self-pay | Admitting: Cardiovascular Disease

## 2023-09-21 VITALS — BP 126/84 | HR 95 | Ht 64.5 in | Wt 151.0 lb

## 2023-09-21 DIAGNOSIS — I739 Peripheral vascular disease, unspecified: Secondary | ICD-10-CM | POA: Diagnosis not present

## 2023-09-21 DIAGNOSIS — E785 Hyperlipidemia, unspecified: Secondary | ICD-10-CM | POA: Diagnosis not present

## 2023-09-21 MED ORDER — EZETIMIBE 10 MG PO TABS: 10.0000 mg | ORAL_TABLET | Freq: Every day | ORAL | 3 refills | Status: DC

## 2023-09-21 NOTE — Progress Notes (Signed)
09/21/2023 [name] [name]   05-Jul-1956  [id number]  Primary Physician Deeann Saint, MD Primary Cardiologist: Runell Gess MD Milagros Loll, [place], [place]  HPI:  [name] [name] is a 68 y.o.  mild to moderately overweight married Caucasian female mother of 1, grandmother of 2 grandchildren referred by Azalee Course, PA-C for peripheral vascular valuation.  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  I last saw her in the office 04/10/2023.  Her risk factors include ongoing tobacco abuse 1/2 pack/day, 11/17/3.  She is accompanied by her husband Thayer Ohm today.  Hypertension and hyperlipidemia.  She does have CAD status post LAD and circumflex intervention back in January 2020.  She is never had a stroke.  She does have lupus anticoagulant with extensive lower extremity DVT and pulmonary emboli status post endovascular therapy with stenting of her lower extremity venous vasculature as well as placement of an IVC filter.  She was on Coumadin for some time and transition to Xarelto.  When she was on triple therapy for her LAD and circumflex stent she had GI bleeding.  Aspirin was discontinued.  She was not tolerant to Plavix.  She does complain of bilateral calf claudication over the last year with Dopplers performed 02/27/2022 revealing a right ABI of 0.57, left of 0.52.  She had high-grade lesions in her distal right SFA and popliteal artery and an occluded left SFA.   I performed peripheral angiography intervention 06/02/2022.  I performed [facility] 1 directional arthrectomy followed by [facility] of 8 short segment CTO in the mid right SFA.  She also had a mid left SFA CTO.  She was discharged home the following day and was readmitted that Friday with pain in her left groin, CT showed hematoma, Doppler showed pseudoaneurysm.  She underwent surgical evacuation and correction of the pseudoaneurysm by Dr. Sherral Hammers.  She just saw Dr. Sherral Hammers in the office last week who  noted that the incision was dry with some surrounding edema.  She does have some paresthesias down the medial aspect of her left thigh.  Her claudication has resolved and her right SFA remained widely patent by duplex ultrasound 06/12/2022.   When I saw her on July 5 of this year she was complaining of recurrent claudication over the last 3 to 4 weeks with Doppler study performed 02/16/2023 revealed a decline in her right ABI to 0.48 with an occlusion of her right popliteal artery.  Our plans are to proceed with angiography and reintervention.  The major issue is going to be Lovenox bridging and careful attention to her left groin given her previous pseudoaneurysm formation.   I performed peripheral angiography on her 03/19/2023 revealing a distal right SFA CTO.  I performed PTA and stenting using a SUPERA self-expanding stent.  She was discharged home 2 days later.  Her groin is remained stable.  Follow-up Doppler studies performed 03/30/2023 revealed marked improvement in her ABI on the right up to 1.06 with a widely patent stent.  Her claudication has resolved.  Since I saw her 6 months ago she continues to do well.  She has no claudication.  She denies chest pain or shortness of breath.   Current Meds  Medication Sig   acetaminophen (TYLENOL) 650 MG CR tablet Take 650 mg by mouth daily as needed for pain.   amLODipine (NORVASC) 2.5 MG tablet TAKE 1 TABLET BY MOUTH TWICE A DAY   bisacodyl (DULCOLAX) 5 MG EC  tablet Take 5 mg by mouth daily as needed for moderate constipation.   carvedilol (COREG) 6.25 MG tablet TAKE 0.5 TABLETS (3.125 MG TOTAL) BY MOUTH 2 (TWO) TIMES DAILY WITH A MEAL.   fenofibrate (TRICOR) 145 MG tablet TAKE 1 TABLET (145 MG TOTAL) BY MOUTH DAILY. TAKE WITH A MEAL.   lisinopril (ZESTRIL) 40 MG tablet TAKE 1 TABLET BY MOUTH EVERY DAY   nitroGLYCERIN (NITROSTAT) 0.4 MG SL tablet Place 1 tablet (0.4 mg total) under the tongue every 5 (five) minutes as needed for up to 3 doses for chest  pain.   XARELTO 20 MG TABS tablet TAKE 1 TABLET BY MOUTH DAILY WITH SUPPER.     Allergies  Allergen Reactions   Prednisone Other (See Comments)    No energy "stalls out".    Makes pt  Jittery.  **makes pt have no energy**   Tramadol Other (See Comments) and Nausea Only    Constipation,  Makes pt  Jittery.  constipation   Crestor [Rosuvastatin Calcium]     Feels fatigued, RE  CHALLENGE - UNABLE TO USE 12/28/18   Hctz [Hydrochlorothiazide]     Dizzy    Repatha [Evolocumab] Other (See Comments)    Per patient never took this medication    Social History   Socioeconomic History   Marital status: Married    Spouse name: Not on file   Number of children: 2   Years of education: Not on file   Highest education level: Bachelor's degree (e.g., BA, AB, BS)  Occupational History   Occupation: SERVICE MANAGER    Employer: [employer]  Tobacco Use   Smoking status: Every Day    Current packs/day: 0.00    Average packs/day: 0.5 packs/day for 29.0 years (14.5 ttl pk-yrs)    Types: Cigarettes    Start date: 05/1993    Last attempt to quit: 05/2022    Years since quitting: 1.3   Smokeless tobacco: Never   Tobacco comments:    Cut back to quarter pack a day  Vaping Use   Vaping status: Never Used  Substance and Sexual Activity   Alcohol use: Yes    Comment: Occasional   Drug use: No   Sexual activity: Not on file  Other Topics Concern   Not on file  Social History Narrative   Really is a married mother of 2.  1 son died from complications of spina bifida after multiple surgeries.   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     She has a has a Scientist, water quality in education.     Drinks social alcohol & denies drug use.     Pt endorses smoking maybe 2 cigarettes/day.  States she placed most of the cigarette, may have 2 puffs.   Social Drivers of Corporate investment banker Strain: Low Risk  (06/01/2023)   Overall Financial Resource Strain  (CARDIA)    Difficulty of Paying Living Expenses: Not hard at all  Food Insecurity: No Food Insecurity (06/01/2023)   Hunger Vital Sign    Worried About Running Out of Food in the Last Year: Never true    Ran Out of Food in the Last Year: Never true  Transportation Needs: No Transportation Needs (06/01/2023)   PRAPARE - Administrator, Civil Service (Medical): No    Lack of Transportation (Non-Medical): No  Physical Activity: Insufficiently Active (06/01/2023)   Exercise Vital Sign    Days of Exercise per  Week: 1 day    Minutes of Exercise per Session: 20 min  Stress: No Stress Concern Present (06/01/2023)   [employer] of Occupational Health - Occupational Stress Questionnaire    Feeling of Stress : Not at all  Social Connections: Socially Integrated (06/01/2023)   Social Connection and Isolation Panel [NHANES]    Frequency of Communication with Friends and Family: More than three times a week    Frequency of Social Gatherings with Friends and Family: Twice a week    Attends Religious Services: More than 4 times per year    Active Member of [employer] or Organizations: Yes    Attends Banker Meetings: 1 to 4 times per year    Marital Status: Married  Catering manager Violence: Not At Risk (10/27/2022)   Humiliation, Afraid, Rape, and Kick questionnaire    Fear of Current or Ex-Partner: No    Emotionally Abused: No    Physically Abused: No    Sexually Abused: No     Review of Systems: General: negative for chills, fever, night sweats or weight changes.  Cardiovascular: negative for chest pain, dyspnea on exertion, edema, orthopnea, palpitations, paroxysmal nocturnal dyspnea or shortness of breath Dermatological: negative for rash Respiratory: negative for cough or wheezing Urologic: negative for hematuria Abdominal: negative for nausea, vomiting, diarrhea, bright red blood per rectum, melena, or hematemesis Neurologic: negative for visual changes,  syncope, or dizziness All other systems reviewed and are otherwise negative except as noted above.    Blood pressure 126/84, pulse 95, height 5' 4.5" (1.638 m), weight 151 lb (68.5 kg), SpO2 99%.  General appearance: alert and no distress Neck: no adenopathy, no carotid bruit, no JVD, supple, symmetrical, trachea midline, and thyroid not enlarged, symmetric, no tenderness/mass/nodules Lungs: clear to auscultation bilaterally Heart: regular rate and rhythm, S1, S2 normal, no murmur, click, rub or gallop Extremities: extremities normal, atraumatic, no cyanosis or edema Pulses: Decreased left pedal pulse Skin: Skin color, texture, turgor normal. No rashes or lesions Neurologic: Grossly normal  EKG not performed today      ASSESSMENT AND PLAN:   PAD (peripheral artery disease) (HCC) History of PAD status post right SFA intervention 06/02/2022 with [facility] 1 directional atherectomy followed by [facility] of a short segment CTO of her mid right SFA.  Her left SFA had a CTO as well but she was asymptomatic.  She ultimately occluded this and had repeat angiography by myself 03/19/2023 with stenting using a superior self expanding stent.  She was discharged home 2 days later.  Her follow-up Doppler studies normalized and her claudication has resolved.     Runell Gess MD FACP,FACC,FAHA, [facility] 09/21/2023 10:47 AM

## 2023-09-21 NOTE — Patient Instructions (Signed)
Medication Instructions:  Your physician has recommended you make the following change in your medication:   -Start taking ezetimibe (zetia) 10mg  once daily.  *If you need a refill on your cardiac medications before your next appointment, please call your pharmacy*   Lab Work: Your physician recommends that you return for lab work in: 3 months for FASTING lipid/liver panel.  If you have labs (blood work) drawn today and your tests are completely normal, you will receive your results only by: MyChart Message (if you have MyChart) OR A paper copy in the mail If you have any lab test that is abnormal or we need to change your treatment, we will call you to review the results.   Testing/Procedures: Your physician has requested that you have a lower extremity arterial duplex. During this test, ultrasound is used to evaluate arterial blood flow in the legs. Allow one hour for this exam. There are no restrictions or special instructions. This will take place at [address], [address]. **To do in August**  Please note: We ask at that you not bring children with you during ultrasound (echo/ vascular) testing. Due to room size and safety concerns, children are not allowed in the ultrasound rooms during exams. Our front office staff cannot provide observation of children in our lobby area while testing is being conducted. An adult accompanying a patient to their appointment will only be allowed in the ultrasound room at the discretion of the ultrasound technician under special circumstances. We apologize for any inconvenience.  Your physician has requested that you have an ankle brachial index (ABI). During this test an ultrasound and blood pressure cuff are used to evaluate the arteries that supply the arms and legs with blood. Allow thirty minutes for this exam. There are no restrictions or special instructions. This will take place at [address], [address].  **To do in  August**   Please note: We ask at that you not bring children with you during ultrasound (echo/ vascular) testing. Due to room size and safety concerns, children are not allowed in the ultrasound rooms during exams. Our front office staff cannot provide observation of children in our lobby area while testing is being conducted. An adult accompanying a patient to their appointment will only be allowed in the ultrasound room at the discretion of the ultrasound technician under special circumstances. We apologize for any inconvenience.    Follow-Up: At [facility], you and your health needs are our priority.  As part of our continuing mission to provide you with exceptional heart care, we have created designated Provider Care Teams.  These Care Teams include your primary Cardiologist (physician) and Advanced Practice Providers (APPs -  Physician Assistants and Nurse Practitioners) who all work together to provide you with the care you need, when you need it.  We recommend signing up for the patient portal called "MyChart".  Sign up information is provided on this After Visit Summary.  MyChart is used to connect with patients for Virtual Visits (Telemedicine).  Patients are able to view lab/test results, encounter notes, upcoming appointments, etc.  Non-urgent messages can be sent to your provider as well.   To learn more about what you can do with MyChart, go to [email or web address].    Your next appointment:   12 month(s)  Provider:   Nanetta Batty, MD

## 2023-09-21 NOTE — Assessment & Plan Note (Signed)
History of PAD status post right SFA intervention 06/02/2022 with [facility] 1 directional atherectomy followed by [facility] of a short segment CTO of her mid right SFA.  Her left SFA had a CTO as well but she was asymptomatic.  She ultimately occluded this and had repeat angiography by myself 03/19/2023 with stenting using a superior self expanding stent.  She was discharged home 2 days later.  Her follow-up Doppler studies normalized and her claudication has resolved.

## 2023-10-26 ENCOUNTER — Ambulatory Visit (INDEPENDENT_AMBULATORY_CARE_PROVIDER_SITE_OTHER): Admitting: Family Medicine

## 2023-10-26 ENCOUNTER — Ambulatory Visit: Payer: Self-pay | Admitting: Family Medicine

## 2023-10-26 ENCOUNTER — Encounter: Payer: Self-pay | Admitting: Family Medicine

## 2023-10-26 VITALS — BP 158/78 | HR 96 | Temp 98.4°F | Wt 151.3 lb

## 2023-10-26 DIAGNOSIS — R059 Cough, unspecified: Secondary | ICD-10-CM

## 2023-10-26 MED ORDER — BENZONATATE 100 MG PO CAPS: 100.0000 mg | ORAL_CAPSULE | Freq: Three times a day (TID) | ORAL | 0 refills | Status: DC | PRN

## 2023-10-26 MED ORDER — AMOXICILLIN-POT CLAVULANATE 875-125 MG PO TABS: 1.0000 | ORAL_TABLET | Freq: Two times a day (BID) | ORAL | 0 refills | Status: DC

## 2023-10-26 NOTE — Progress Notes (Signed)
 Established Patient Office Visit  Subjective   Patient ID: [name] [name], female    DOB: [birth year]  Age: 68 y.o. MRN: [id number]  Chief Complaint  Patient presents with   Cough    Patient complains of cough, Productive, x5 days     HPI   [name] [name] is seen as a work and with about 5-day history of productive cough.  [name] [name] does have history of nicotine use but smokes less than half pack a day.  Her husband has similar symptoms.  [name] [name] states [name] [name] had some low-grade fever on Friday but none since then.  Some mild nasal congestion.  Cough has been fairly intense at times.  No reported COPD though [name] [name] has smoked for several years.  [name] [name] does have history of CAD, hypertension, peripheral artery disease.  Denies any dyspnea at rest.  Past Medical History:  Diagnosis Date   Back pain    CAD S/P percutaneous coronary angioplasty 09/13/2018   09/13/2018 - Cath & Staged DES PCI on 09/14/2018: DES PCI: Cx99%&55% - Resolute Onyx DES 3.5 x 30 (3.6 mm), p-mLAD (prox of D1 almost to D2) - Resolute Onyx DES 3.0 x 22 (3.3 mm); DFR on pRCA 70% - 0.99, Not significant -> Rec Med Rx.   DVT (deep venous thrombosis) (HCC)    Heart attack (HCC) 09/09/2018   Hypertension    Lupus anticoagulant syndrome (HCC)    NSTEMI (non-ST elevated myocardial infarction) (HCC) 09/12/2018   Initial presentation with chest pain was on 09/09/2018, recurrent pain on 1/26 2020 with positive troponins.  Cath showed three-vessel CAD -> declined CABG, opted for two-vessel DES PCI (LAD and CX, negative DFR RCA)   Pulmonary embolism (HCC)    Past Surgical History:  Procedure Laterality Date   ABDOMINAL AORTOGRAM W/LOWER EXTREMITY N/A 06/02/2022   Procedure: ABDOMINAL AORTOGRAM W/LOWER EXTREMITY;  Surgeon: Runell Gess, MD;  Location: [facility];  Service: Cardiovascular;  Laterality: N/A;   ABDOMINAL AORTOGRAM W/LOWER EXTREMITY N/A 03/19/2023   Procedure: ABDOMINAL AORTOGRAM W/LOWER EXTREMITY;  Surgeon: Runell Gess, MD;   Location: [facility];  Service: Cardiovascular;  Laterality: N/A;   APPLICATION OF WOUND VAC Left 06/06/2022   Procedure: APPLICATION OF WOUND VAC LEFT GROIN;  Surgeon: Victorino Sparrow, MD;  Location: [facility] OR;  Service: Vascular;  Laterality: Left;   ARTERY REPAIR Left 06/06/2022   Procedure: LEFT COMMON FEMORAL ARTERY REPAIR WITH MORCELLS;  Surgeon: Victorino Sparrow, MD;  Location: [facility] OR;  Service: Vascular;  Laterality: Left;   BACK SURGERY     COLONOSCOPY  05/19/2012   Procedure: COLONOSCOPY;  Surgeon: Theda Belfast, MD;  Location: [facility] ENDOSCOPY;  Service: Endoscopy;  Laterality: N/A;   COLONOSCOPY WITH PROPOFOL N/A 09/30/2018   Procedure: COLONOSCOPY WITH PROPOFOL;  Surgeon: Benancio Deeds, MD;  Location: [facility] ENDOSCOPY;  Service: Gastroenterology;  Laterality: N/A;   CORONARY PRESSURE/FFR STUDY N/A 09/14/2018   Procedure: INTRAVASCULAR PRESSURE WIRE/FFR STUDY;  Surgeon: Swaziland, Peter M, MD;  Location: [facility] INVASIVE CV LAB;  Service: Cardiovascular;  Laterality: RCA: DFR on pRCA 70% - 0.99, Not significant -> Rec Med Rx.   CORONARY STENT INTERVENTION N/A 09/14/2018   Procedure: CORONARY STENT INTERVENTION;  Surgeon: Swaziland, Peter M, MD;  Location: [facility] INVASIVE CV LAB;  Service: Cardiovascular: September 14, 2018- DES PCI: Cx99%&55% - Resolute Onyx DES 3.5 x 30 (3.6 mm), p-mLAD (prox of D1 almost to D2) - Resolute Onyx DES 3.0 x 22 (3.3 mm); DFR on pRCA 70% -  0.99, Not significant -> Rec Med Rx.   ESOPHAGOGASTRODUODENOSCOPY  05/19/2012   Procedure: ESOPHAGOGASTRODUODENOSCOPY (EGD);  Surgeon: Theda Belfast, MD;  Location: [facility] ENDOSCOPY;  Service: Endoscopy;  Laterality: N/A;   ESOPHAGOGASTRODUODENOSCOPY (EGD) WITH PROPOFOL N/A 09/30/2018   Procedure: ESOPHAGOGASTRODUODENOSCOPY (EGD) WITH PROPOFOL;  Surgeon: Benancio Deeds, MD;  Location: [facility] ENDOSCOPY;  Service: Gastroenterology;  Laterality: N/A;   GIVENS CAPSULE STUDY N/A 09/30/2018   Procedure: GIVENS CAPSULE STUDY;  Surgeon: Benancio Deeds, MD;  Location: [facility] ENDOSCOPY;  Service: Gastroenterology;  Laterality: N/A;   HEMATOMA EVACUATION Left 06/06/2022   Procedure: EVACUATION HEMATOMA LEFT GROIN;  Surgeon: Victorino Sparrow, MD;  Location: [facility] OR;  Service: Vascular;  Laterality: Left;   HOT HEMOSTASIS N/A 09/30/2018   Procedure: HOT HEMOSTASIS (ARGON PLASMA COAGULATION/BICAP);  Surgeon: Benancio Deeds, MD;  Location: [facility] ENDOSCOPY;  Service: Gastroenterology;  Laterality: N/A;   LEFT HEART CATH AND CORONARY ANGIOGRAPHY N/A 09/13/2018   Procedure: LEFT HEART CATH AND CORONARY ANGIOGRAPHY;  Surgeon: Marykay Lex, MD;  Location: [facility] INVASIVE CV LAB;  Service: Cardiovascular:: CULPRIT LESION 99% p-mCx followed by 55%mCx; pLAD 35% A D54followed by long 80% lesion @ SP1); pRCA 70%. Normal LVEDP. Global HK EF ~45%.  -Decision on PCI deferred until discussion about PCI versus CABG.  Concern because of long-term DOAC.   PERIPHERAL VASCULAR ATHERECTOMY  06/02/2022   Procedure: PERIPHERAL VASCULAR ATHERECTOMY;  Surgeon: Runell Gess, MD;  Location: [facility] INVASIVE CV LAB;  Service: Cardiovascular;;   PERIPHERAL VASCULAR BALLOON ANGIOPLASTY  06/02/2022   Procedure: PERIPHERAL VASCULAR BALLOON ANGIOPLASTY;  Surgeon: Runell Gess, MD;  Location: [facility] CV [facility];  Service: Cardiovascular;;   TRANSTHORACIC ECHOCARDIOGRAM  09/13/2018   Mildly reduced EF 45 to 50% with diffuse HK.  GR 1 DD.  Mild aortic valve calcification.  MAC.  Moderate pulmonic regurgitation.   WOUND EXPLORATION Left 06/06/2022   Procedure: LEFT GROIN EXPLORATION;  Surgeon: Victorino Sparrow, MD;  Location: [facility] OR;  Service: Vascular;  Laterality: Left;    reports that [name] [name] has been smoking cigarettes. [name] [name] started smoking about 30 years ago. [name] [name] has a 14.5 pack-year smoking history. [name] [name] has never used smokeless tobacco. [name] [name] reports current alcohol use. [name] [name] reports that [name] [name] does not use drugs. family history includes Cancer in her father and paternal grandfather;  Leukemia in her mother and paternal grandmother; Lung cancer in her maternal grandfather; Prostate cancer in her father; Spina bifida in her son; Stroke in her maternal grandmother. Allergies  Allergen Reactions   Prednisone Other (See Comments)    No energy "stalls out".    Makes pt  Jittery.  **makes pt have no energy**   Tramadol Other (See Comments) and Nausea Only    Constipation,  Makes pt  Jittery.  constipation   Crestor [Rosuvastatin Calcium]     Feels fatigued, RE  CHALLENGE - UNABLE TO USE 12/28/18   Hctz [Hydrochlorothiazide]     Dizzy    Repatha [Evolocumab] Other (See Comments)    Per patient never took this medication   Zithromax [Azithromycin] Other (See Comments)    jittery    Review of Systems  Constitutional:  Negative for chills and fever.  HENT:  Negative for sore throat.   Respiratory:  Positive for cough and sputum production. Negative for hemoptysis and wheezing.   Cardiovascular:  Negative for chest pain.      Objective:     BP (!) 158/78 (BP Location: Left Arm, Patient Position: Sitting, Cuff  Size: Normal)   Pulse 96   Temp 98.4 F (36.9 C) (Oral)   Wt 151 lb 4.8 oz (68.6 kg)   SpO2 95%   BMI 25.57 kg/m  BP Readings from Last 3 Encounters:  10/26/23 (!) 158/78  09/21/23 126/84  06/01/23 (!) 160/75   Wt Readings from Last 3 Encounters:  10/26/23 151 lb 4.8 oz (68.6 kg)  09/21/23 151 lb (68.5 kg)  06/01/23 150 lb 9.6 oz (68.3 kg)      Physical Exam Vitals reviewed.  Constitutional:      General: [name] [name] is not in acute distress.    Appearance: [name] [name] is not ill-appearing.  Cardiovascular:     Rate and Rhythm: Normal rate.  Pulmonary:     Comments: Diminished breath sounds throughout.  No rales.  Only a couple of faint expiratory wheezes.  No retractions. Neurological:     Mental Status: [name] [name] is alert.      No results found for any visits on 10/26/23.    The ASCVD Risk score (Arnett DK, et al., 2019) failed to calculate for the  following reasons:   Risk score cannot be calculated because patient has a medical history suggesting prior/existing ASCVD    Assessment & Plan:   Patient presents with partially 1 week history of productive cough.  [name] [name] has had prior intolerance with prednisone.  Low-grade fever late last week but none since then.  We explained this may be viral in terms of etiology but [name] [name] does have increased risk with multi decade history of smoking -Recommend Tessalon Perles 100 mg every 8 hours as needed for cough -Start Augmentin 875 mg twice daily for 7 days -Follow-up immediately for any recurrent fever or increased shortness of breath  Evelena Peat, MD

## 2023-10-26 NOTE — Patient Instructions (Signed)
 Follow up for any fever or increased shortness of breath.

## 2023-10-26 NOTE — Telephone Encounter (Signed)
 Patient is seeing provider in office 3/10

## 2023-10-26 NOTE — Telephone Encounter (Signed)
  Chief Complaint: Cough - runny nose Symptoms: Above Frequency: Thursday Pertinent Negatives: Patient denies Fever - resolved, sob Disposition: [] ED /[] Urgent Care (no appt availability in office) / [x] Appointment(In office/virtual)/ []  [place] [facility]/ [] Home Care/ [] Refused Recommended Disposition /[] [place] [facility]/ []  Follow-up with PCP Additional Notes: Pt and her husband started with cough Thursday. Pt also had LGF which has resolved. Pt now has wet sounding cough and brings up clear phlegm. Pt wants to be seen by provider.    Copied from CRM [id number]. Topic: Clinical - Pink Word Triage >> Oct 26, 2023  8:08 AM Kathryne Eriksson wrote: Reason for Triage: Concerns Of Possible Flu Or COVID Reason for Disposition  Cough with cold symptoms (e.g., runny nose, postnasal drip, throat clearing)  Answer Assessment - Initial Assessment Questions 1. ONSET: "When did the cough begin?"      Thursday afternoon 2. SEVERITY: "How bad is the cough today?"      Near constant 3. SPUTUM: "Describe the color of your sputum" (none, dry cough; clear, white, yellow, green)     clear 4. HEMOPTYSIS: "Are you coughing up any blood?" If so ask: "How much?" (flecks, streaks, tablespoons, etc.)     no 5. DIFFICULTY BREATHING: "Are you having difficulty breathing?" If Yes, ask: "How bad is it?" (e.g., mild, moderate, severe)    - MILD: No SOB at rest, mild SOB with walking, speaks normally in sentences, can lie down, no retractions, pulse < 100.    - MODERATE: SOB at rest, SOB with minimal exertion and prefers to sit, cannot lie down flat, speaks in phrases, mild retractions, audible wheezing, pulse 100-120.    - SEVERE: Very SOB at rest, speaks in single words, struggling to breathe, sitting hunched forward, retractions, pulse > 120      With coughing 6. FEVER: "Do you have a fever?" If Yes, ask: "What is your temperature, how was it measured, and when did it start?"     Fever has resolved 7.  CARDIAC HISTORY: "Do you have any history of heart disease?" (e.g., heart attack, congestive heart failure)      Yes - htn 8. LUNG HISTORY: "Do you have any history of lung disease?"  (e.g., pulmonary embolus, asthma, emphysema)     no 10. OTHER SYMPTOMS: "Do you have any other symptoms?" (e.g., runny nose, wheezing, chest pain)       Feels horrible  Protocols used: Cough - Acute Productive-A-AH

## 2023-11-09 ENCOUNTER — Ambulatory Visit: Payer: Self-pay

## 2023-11-09 NOTE — Telephone Encounter (Signed)
 Chief Complaint: upper abd Symptoms: pain, Frequency: 2 days ago started Pertinent Negatives: Patient denies N/V/D, SOB, chills, fever, major abd surgeries Disposition: [] ED /[] Urgent Care (no appt availability in office) / [] Appointment(In office/virtual)/ []  [place] [facility]/ [] Home Care/ [] Refused Recommended Disposition /[] [place] [facility]/ []  Follow-up with PCP Additional Notes: Pt states 2 days ago upper ABD pain started. Pt states that the pain is worse after eating. Pt states when palpating on the RUQ, and radiates to the back. Pt states she ate restaurant food all weekend.  Pt states she has never had any major abd surgeries. Offered pt appts today, pt declined, stating that she has guests coming over today. Pt offered appts tomorrow, pt declined as she has to work. Pt requested appt 3/26 in afternoon d/t sched conflicts. Pt sched 3/26. Pt advised to call back per Epic instructions. Pt agreeable and understands.  Copied from CRM [id number]. Topic: Clinical - Red Word Triage >> Nov 09, 2023  9:26 AM Mackie Pai E wrote: [facility] that prompted transfer to Nurse Triage: Pain in right side rib cage. Patient has been experiencing pain in her right side after she eats. States it is under her rib cage and makes it uncomfortable to sleep. Patient rated pain at a level 6-7 out of 10. Patient is worried it could be her gallbladder. Symptoms have been going on since Saturday 3/22. Reason for Disposition  [1] MODERATE pain (e.g., interferes with normal activities) AND [2] comes and goes (cramps) AND [3] present > 24 hours  (Exception: Pain with Vomiting or Diarrhea - see that Guideline.)  Answer Assessment - Initial Assessment Questions 1. LOCATION: "Where does it hurt?"      RUQ 2. RADIATION: "Does the pain shoot anywhere else?" (e.g., chest, back)     back 3. ONSET: "When did the pain begin?" (e.g., minutes, hours or days ago)      About 2 days ago 4. SUDDEN: "Gradual or sudden  onset?"     gradual 5. PATTERN "Does the pain come and go, or is it constant?"    - If it comes and goes: "How long does it last?" "Do you have pain now?"     (Note: Comes and goes means the pain is intermittent. It goes away completely between bouts.)    - If constant: "Is it getting better, staying the same, or getting worse?"      (Note: Constant means the pain never goes away completely; most serious pain is constant and gets worse.)      Intermittent, worse with food 6. SEVERITY: "How bad is the pain?"  (e.g., Scale 1-10; mild, moderate, or severe)    - MILD (1-3): Doesn't interfere with normal activities, abdomen soft and not tender to touch..     - MODERATE (4-7): Interferes with normal activities or awakens from sleep, abdomen tender to touch.     - SEVERE (8-10): Excruciating pain, doubled over, unable to do any normal activities.       7 at worst 7. RECURRENT SYMPTOM: "Have you ever had this type of stomach pain before?" If Yes, ask: "When was the last time?" and "What happened that time?"      denies 8. AGGRAVATING FACTORS: "Does anything seem to cause this pain?" (e.g., foods, stress, alcohol)     food 9. CARDIAC SYMPTOMS: "Do you have any of the following symptoms: chest pain, difficulty breathing, sweating, nausea?"     denies 10. OTHER SYMPTOMS: "Do you have any other symptoms?" (e.g.,  back pain, diarrhea, fever, urination pain, vomiting)       denies  Protocols used: Abdominal Pain - Upper-A-AH

## 2023-11-09 NOTE — Telephone Encounter (Signed)
 Patient has an appt sch with Burchette, MD 11/11/2023 at 4:00 PM

## 2023-11-11 ENCOUNTER — Ambulatory Visit: Admitting: Family Medicine

## 2023-11-12 ENCOUNTER — Encounter: Payer: Self-pay | Admitting: Family Medicine

## 2023-11-12 ENCOUNTER — Ambulatory Visit (INDEPENDENT_AMBULATORY_CARE_PROVIDER_SITE_OTHER): Admitting: Family Medicine

## 2023-11-12 VITALS — BP 160/80 | HR 87 | Temp 98.3°F | Wt 150.6 lb

## 2023-11-12 DIAGNOSIS — J302 Other seasonal allergic rhinitis: Secondary | ICD-10-CM

## 2023-11-12 DIAGNOSIS — Z7901 Long term (current) use of anticoagulants: Secondary | ICD-10-CM

## 2023-11-12 DIAGNOSIS — R1011 Right upper quadrant pain: Secondary | ICD-10-CM | POA: Diagnosis not present

## 2023-11-12 NOTE — Progress Notes (Signed)
 Established Patient Office Visit   Subjective  Patient ID: [name] [name], female    DOB: 05/06/1956  Age: 68 y.o. MRN: [id number]  Chief Complaint  Patient presents with   Abdominal Pain    Patient complains of right upper abdominal pain, x5 days   Diarrhea         Patient is a 68 year old female seen for acute concern.  Patient endorses RUQ abdominal pain 5 days ago after eating fish and chips.  Pain started that evening with radiation into back.  The next day pain returned after eating fried shrimp.  Also had loose stools.  Denies nausea, vomiting, fever, chills, constipation.  Patient has her gallbladder.  Patient endorses improvement in symptoms since now eating a more bland diet.  Patient notes increased postnasal drainage and nasal congestion causing productive sounding cough.  Patient seen a few weeks ago in clinic by Dr. Caryl Never for symptoms.  Patient states starting to think symptoms now related to allergies.  Has never had allergies before.  Has not tried anything for symptoms as was unsure of what she could take with blood thinner    Patient Active Problem List   Diagnosis Date Noted   Pseudoaneurysm of left femoral artery (HCC) 06/06/2022   Claudication in peripheral vascular disease (HCC) 06/02/2022   Myalgia due to statin 03/25/2022   PAD (peripheral artery disease) (HCC) 03/21/2022   Coronary artery disease involving native heart without angina pectoris 05/28/2021   Statin myopathy 05/10/2021   Encounter for smoking cessation counseling 04/20/2020   Accelerated hypertension 05/29/2019   Lupus anticoagulant syndrome (HCC) 05/29/2019   Antiplatelet or antithrombotic long-term use    Acute on chronic anemia 09/28/2018   CAD -S/P PCI 09/24/2018   Non-ST elevation (NSTEMI) myocardial infarction [facility])    History of pulmonary embolus (PE) 09/12/2018   Microcytic anemia 01/24/2016   Hypertriglyceridemia 01/24/2016   Familial hyperlipidemia 06/21/2015   History of  DVT of lower extremity    Edema of left lower extremity    Hypercoagulation syndrome (HCC) 05/19/2012   Iron deficiency 05/19/2012   Thrombocytosis 05/19/2012   Acute blood loss anemia 05/18/2012   Sinus tachycardia 04/21/2012   Hypokalemia 04/20/2012   Tobacco use 04/20/2012   Essential hypertension 04/20/2012   Past Medical History:  Diagnosis Date   Back pain    CAD S/P percutaneous coronary angioplasty 09/13/2018   09/13/2018 - Cath & Staged DES PCI on 09/14/2018: DES PCI: Cx99%&55% - Resolute Onyx DES 3.5 x 30 (3.6 mm), p-mLAD (prox of D1 almost to D2) - Resolute Onyx DES 3.0 x 22 (3.3 mm); DFR on pRCA 70% - 0.99, Not significant -> Rec Med Rx.   DVT (deep venous thrombosis) (HCC)    Heart attack (HCC) 09/09/2018   Hypertension    Lupus anticoagulant syndrome (HCC)    NSTEMI (non-ST elevated myocardial infarction) (HCC) 09/12/2018   Initial presentation with chest pain was on 09/09/2018, recurrent pain on 1/26 2020 with positive troponins.  Cath showed three-vessel CAD -> declined CABG, opted for two-vessel DES PCI (LAD and CX, negative DFR RCA)   Pulmonary embolism (HCC)    Past Surgical History:  Procedure Laterality Date   ABDOMINAL AORTOGRAM W/LOWER EXTREMITY N/A 06/02/2022   Procedure: ABDOMINAL AORTOGRAM W/LOWER EXTREMITY;  Surgeon: Runell Gess, MD;  Location: [facility] INVASIVE CV LAB;  Service: Cardiovascular;  Laterality: N/A;   ABDOMINAL AORTOGRAM W/LOWER EXTREMITY N/A 03/19/2023   Procedure: ABDOMINAL AORTOGRAM W/LOWER EXTREMITY;  Surgeon: Runell Gess, MD;  Location: [facility] INVASIVE CV LAB;  Service: Cardiovascular;  Laterality: N/A;   APPLICATION OF WOUND VAC Left 06/06/2022   Procedure: APPLICATION OF WOUND VAC LEFT GROIN;  Surgeon: Victorino Sparrow, MD;  Location: [facility] OR;  Service: Vascular;  Laterality: Left;   ARTERY REPAIR Left 06/06/2022   Procedure: LEFT COMMON FEMORAL ARTERY REPAIR WITH MORCELLS;  Surgeon: Victorino Sparrow, MD;  Location: [facility] OR;  Service:  Vascular;  Laterality: Left;   BACK SURGERY     COLONOSCOPY  05/19/2012   Procedure: COLONOSCOPY;  Surgeon: Theda Belfast, MD;  Location: [facility] ENDOSCOPY;  Service: Endoscopy;  Laterality: N/A;   COLONOSCOPY WITH PROPOFOL N/A 09/30/2018   Procedure: COLONOSCOPY WITH PROPOFOL;  Surgeon: Benancio Deeds, MD;  Location: [facility] ENDOSCOPY;  Service: Gastroenterology;  Laterality: N/A;   CORONARY PRESSURE/FFR STUDY N/A 09/14/2018   Procedure: INTRAVASCULAR PRESSURE WIRE/FFR STUDY;  Surgeon: Swaziland, Peter M, MD;  Location: [facility] INVASIVE CV LAB;  Service: Cardiovascular;  Laterality: RCA: DFR on pRCA 70% - 0.99, Not significant -> Rec Med Rx.   CORONARY STENT INTERVENTION N/A 09/14/2018   Procedure: CORONARY STENT INTERVENTION;  Surgeon: Swaziland, Peter M, MD;  Location: [facility] INVASIVE CV LAB;  Service: Cardiovascular: September 14, 2018- DES PCI: Cx99%&55% - Resolute Onyx DES 3.5 x 30 (3.6 mm), p-mLAD (prox of D1 almost to D2) - Resolute Onyx DES 3.0 x 22 (3.3 mm); DFR on pRCA 70% - 0.99, Not significant -> Rec Med Rx.   ESOPHAGOGASTRODUODENOSCOPY  05/19/2012   Procedure: ESOPHAGOGASTRODUODENOSCOPY (EGD);  Surgeon: Theda Belfast, MD;  Location: [facility] ENDOSCOPY;  Service: Endoscopy;  Laterality: N/A;   ESOPHAGOGASTRODUODENOSCOPY (EGD) WITH PROPOFOL N/A 09/30/2018   Procedure: ESOPHAGOGASTRODUODENOSCOPY (EGD) WITH PROPOFOL;  Surgeon: Benancio Deeds, MD;  Location: [facility] ENDOSCOPY;  Service: Gastroenterology;  Laterality: N/A;   GIVENS CAPSULE STUDY N/A 09/30/2018   Procedure: GIVENS CAPSULE STUDY;  Surgeon: Benancio Deeds, MD;  Location: [facility] ENDOSCOPY;  Service: Gastroenterology;  Laterality: N/A;   HEMATOMA EVACUATION Left 06/06/2022   Procedure: EVACUATION HEMATOMA LEFT GROIN;  Surgeon: Victorino Sparrow, MD;  Location: [facility] OR;  Service: Vascular;  Laterality: Left;   HOT HEMOSTASIS N/A 09/30/2018   Procedure: HOT HEMOSTASIS (ARGON PLASMA COAGULATION/BICAP);  Surgeon: Benancio Deeds, MD;  Location: [facility] ENDOSCOPY;   Service: Gastroenterology;  Laterality: N/A;   LEFT HEART CATH AND CORONARY ANGIOGRAPHY N/A 09/13/2018   Procedure: LEFT HEART CATH AND CORONARY ANGIOGRAPHY;  Surgeon: Marykay Lex, MD;  Location: [facility] INVASIVE CV LAB;  Service: Cardiovascular:: CULPRIT LESION 99% p-mCx followed by 55%mCx; pLAD 35% A D24followed by long 80% lesion @ SP1); pRCA 70%. Normal LVEDP. Global HK EF ~45%.  -Decision on PCI deferred until discussion about PCI versus CABG.  Concern because of long-term DOAC.   PERIPHERAL VASCULAR ATHERECTOMY  06/02/2022   Procedure: PERIPHERAL VASCULAR ATHERECTOMY;  Surgeon: Runell Gess, MD;  Location: [facility] INVASIVE CV LAB;  Service: Cardiovascular;;   PERIPHERAL VASCULAR BALLOON ANGIOPLASTY  06/02/2022   Procedure: PERIPHERAL VASCULAR BALLOON ANGIOPLASTY;  Surgeon: Runell Gess, MD;  Location: [facility] INVASIVE CV LAB;  Service: Cardiovascular;;   TRANSTHORACIC ECHOCARDIOGRAM  09/13/2018   Mildly reduced EF 45 to 50% with diffuse HK.  GR 1 DD.  Mild aortic valve calcification.  MAC.  Moderate pulmonic regurgitation.   WOUND EXPLORATION Left 06/06/2022   Procedure: LEFT GROIN EXPLORATION;  Surgeon: Victorino Sparrow, MD;  Location: [facility] OR;  Service: Vascular;  Laterality: Left;   Social History   Tobacco Use  Smoking status: Every Day    Current packs/day: 0.00    Average packs/day: 0.5 packs/day for 29.0 years (14.5 ttl pk-yrs)    Types: Cigarettes    Start date: 05/1993    Last attempt to quit: 05/2022    Years since quitting: 1.4   Smokeless tobacco: Never   Tobacco comments:    Cut back to quarter pack a day  Vaping Use   Vaping status: Never Used  Substance Use Topics   Alcohol use: Yes    Comment: Occasional   Drug use: No   Family History  Problem Relation Age of Onset   Leukemia Mother    Prostate cancer Father    Cancer Father    Stroke Maternal Grandmother    Lung cancer Maternal Grandfather    Leukemia Paternal Grandmother    Cancer Paternal Grandfather     Spina bifida Son        Multiple surgeries   Colon cancer Neg Hx    Esophageal cancer Neg Hx    Allergies  Allergen Reactions   Prednisone Other (See Comments)    No energy "stalls out".    Makes pt  Jittery.  **makes pt have no energy**   Tramadol Other (See Comments) and Nausea Only    Constipation,  Makes pt  Jittery.  constipation   Crestor [Rosuvastatin Calcium]     Feels fatigued, RE  CHALLENGE - UNABLE TO USE 12/28/18   Hctz [Hydrochlorothiazide]     Dizzy    Repatha [Evolocumab] Other (See Comments)    Per patient never took this medication   Zithromax [Azithromycin] Other (See Comments)    jittery      ROS Negative unless stated above    Objective:     BP (!) 160/80 (BP Location: Left Arm, Patient Position: Sitting, Cuff Size: Normal)   Pulse 87   Temp 98.3 F (36.8 C) (Oral)   Wt 150 lb 9.6 oz (68.3 kg)   SpO2 99%   BMI 25.45 kg/m  BP Readings from Last 3 Encounters:  11/12/23 (!) 160/80  10/26/23 (!) 158/78  09/21/23 126/84   Wt Readings from Last 3 Encounters:  11/12/23 150 lb 9.6 oz (68.3 kg)  10/26/23 151 lb 4.8 oz (68.6 kg)  09/21/23 151 lb (68.5 kg)      Physical Exam Constitutional:      General: She is not in acute distress.    Appearance: Normal appearance.  HENT:     Head: Normocephalic and atraumatic.     Nose: Nose normal.     Mouth/Throat:     Mouth: Mucous membranes are moist.  Cardiovascular:     Rate and Rhythm: Normal rate and regular rhythm.     Heart sounds: Normal heart sounds. No murmur heard.    No gallop.  Pulmonary:     Effort: Pulmonary effort is normal. No respiratory distress.     Breath sounds: Normal breath sounds. No wheezing, rhonchi or rales.  Abdominal:     General: Bowel sounds are increased.     Palpations: Abdomen is soft. There is no hepatomegaly.     Tenderness: There is abdominal tenderness in the right upper quadrant. There is rebound.  Skin:    General: Skin is warm and dry.  Neurological:      Mental Status: She is alert and oriented to person, place, and time.      No results found for any visits on 11/12/23.    Assessment & Plan:  RUQ  pain -     Comprehensive metabolic panel with GFR -     Lipase -     CBC with Differential/Platelet -     Gamma GT -     US ABDOMEN LIMITED RUQ (LIVER/GB); Future  Seasonal allergies  Chronic anticoagulation  Patient with acute RUQ pain concerning for gallbladder etiology.  Obtain labs and imaging.  Patient given strict precautions.  If ultrasound positive for cholelithiasis or cholecystitis place referral to GEN surgery.  Given strict ED precautions.  OTC Allegra or other antihistamine as needed for allergic rhinitis.  Continue Xarelto for history of lupus anticoagulant syndrome, CAD status post PCI with DES.  Return if symptoms worsen or fail to improve.   Deeann Saint, MD

## 2023-11-12 NOTE — Patient Instructions (Signed)
 Okay to take Allegra.

## 2023-11-13 ENCOUNTER — Encounter: Payer: Self-pay | Admitting: Family Medicine

## 2023-11-13 LAB — CBC WITH DIFFERENTIAL/PLATELET
Basophils Absolute: 0.1 10*3/uL (ref 0.0–0.1)
Basophils Relative: 1.5 % (ref 0.0–3.0)
Eosinophils Absolute: 0.2 10*3/uL (ref 0.0–0.7)
Eosinophils Relative: 2.2 % (ref 0.0–5.0)
HCT: 34.9 % — ABNORMAL LOW (ref 36.0–46.0)
Hemoglobin: 11.6 g/dL — ABNORMAL LOW (ref 12.0–15.0)
Lymphocytes Relative: 27.2 % (ref 12.0–46.0)
Lymphs Abs: 2.2 10*3/uL (ref 0.7–4.0)
MCHC: 33.1 g/dL (ref 30.0–36.0)
MCV: 87.5 fl (ref 78.0–100.0)
Monocytes Absolute: 0.5 10*3/uL (ref 0.1–1.0)
Monocytes Relative: 6.5 % (ref 3.0–12.0)
Neutro Abs: 5.1 10*3/uL (ref 1.4–7.7)
Neutrophils Relative %: 62.6 % (ref 43.0–77.0)
Platelets: 526 10*3/uL — ABNORMAL HIGH (ref 150.0–400.0)
RBC: 3.99 Mil/uL (ref 3.87–5.11)
RDW: 13 % (ref 11.5–15.5)
WBC: 8.2 10*3/uL (ref 4.0–10.5)

## 2023-11-13 LAB — COMPREHENSIVE METABOLIC PANEL WITH GFR
ALT: 11 U/L (ref 0–35)
AST: 18 U/L (ref 0–37)
Albumin: 4.2 g/dL (ref 3.5–5.2)
Alkaline Phosphatase: 48 U/L (ref 39–117)
BUN: 13 mg/dL (ref 6–23)
CO2: 26 meq/L (ref 19–32)
Calcium: 9.6 mg/dL (ref 8.4–10.5)
Chloride: 102 meq/L (ref 96–112)
Creatinine, Ser: 0.86 mg/dL (ref 0.40–1.20)
GFR: 69.39 mL/min (ref >60.00)
Glucose, Bld: 91 mg/dL (ref 70–99)
Potassium: 3.4 meq/L — ABNORMAL LOW (ref 3.5–5.1)
Sodium: 137 meq/L (ref 135–145)
Total Bilirubin: 0.3 mg/dL (ref 0.2–1.2)
Total Protein: 7.2 g/dL (ref 6.0–8.3)

## 2023-11-13 LAB — GAMMA GT: GGT: 28 U/L (ref 7–51)

## 2023-11-13 LAB — LIPASE: Lipase: 40 U/L (ref 11.0–59.0)

## 2023-11-20 ENCOUNTER — Telehealth: Payer: Self-pay

## 2023-11-20 ENCOUNTER — Ambulatory Visit
Admission: RE | Admit: 2023-11-20 | Discharge: 2023-11-20 | Disposition: A | Discharge status: Home / Self Care | Source: Ambulatory Visit | Attending: Family Medicine | Admitting: Family Medicine

## 2023-11-20 ENCOUNTER — Encounter: Payer: Self-pay | Admitting: Family Medicine

## 2023-11-20 ENCOUNTER — Telehealth: Payer: Self-pay | Admitting: Family Medicine

## 2023-11-20 DIAGNOSIS — K810 Acute cholecystitis: Secondary | ICD-10-CM

## 2023-11-20 DIAGNOSIS — R1011 Right upper quadrant pain: Secondary | ICD-10-CM | POA: Diagnosis not present

## 2023-11-20 MED ORDER — METRONIDAZOLE 500 MG PO TABS: 500.0000 mg | ORAL_TABLET | Freq: Two times a day (BID) | ORAL | 0 refills | Status: AC

## 2023-11-20 MED ORDER — CIPROFLOXACIN HCL 500 MG PO TABS: 500.0000 mg | ORAL_TABLET | Freq: Two times a day (BID) | ORAL | 0 refills | Status: AC

## 2023-11-20 NOTE — Telephone Encounter (Signed)
 Critical lab call from [place] [facility]  of the Abd shows  acute cholecystitis.

## 2023-11-20 NOTE — Telephone Encounter (Signed)
 Patient contacted about results of RUQ ultrasound done today.  Acute cholecystitis noted.  Patient no longer having RUQ pain since adhering to diet modifications.  Endorses being sore from ultrasound.  Discussed starting ABX and placing referral to GEN surg for cholecystectomy.  Patient in agreements with plan.  Given strict ED precautions.  Questions answered to satisfaction.  Abbe Amsterdam, MD

## 2023-11-25 NOTE — Telephone Encounter (Signed)
 Matter addressed.  See phone note from that day.

## 2023-11-26 NOTE — Telephone Encounter (Signed)
 There are not really any other options.  It is not that these are "strong medications". Two antibiotics are indicated to cover all possible pathogens (bugs or bacteria) that could be causing the inflammation of your gallbladder.  If you are worried about having an allergic reaction you could try taking 1 medication at a time, however the prolonged course of antibiotics (2 weeks) may cause diarrhea.  You could elect to forego taking the medication however if the infection becomes worse it could spread into your bloodstream making you septic.  This can make you really sick requiring hospitalization and even lead to death.

## 2023-11-27 ENCOUNTER — Telehealth: Payer: Self-pay

## 2023-11-27 DIAGNOSIS — K8020 Calculus of gallbladder without cholecystitis without obstruction: Secondary | ICD-10-CM | POA: Diagnosis not present

## 2023-11-27 NOTE — Telephone Encounter (Signed)
 I called and s/w the surgeon office and they do need recommendations to hold Xarelto.

## 2023-11-27 NOTE — Telephone Encounter (Signed)
   Pre-operative Risk Assessment    Patient Name: [name] [name]  DOB: 1956/07/26 MRN: [id number]   Date of last office visit: 09/21/23 Date of next office visit: Not scheduled   Request for Surgical Clearance    Procedure:   Cholecystectomy   Date of Surgery:  Clearance TBD                                Surgeon:  Melody Haver Surgeon's Group or Practice Name:  [facility] Phone number:  [phone] Fax number:  [phone]   Type of Clearance Requested:   - Medical    Type of Anesthesia:  General    Additional requests/questions:    Garrel Ridgel   11/27/2023, 3:21 PM

## 2023-11-30 ENCOUNTER — Telehealth: Payer: Self-pay

## 2023-11-30 NOTE — Telephone Encounter (Signed)
  Patient Consent for Virtual Visit        [name] [name] has provided verbal consent on 11/30/2023 for a virtual visit (video or telephone).   CONSENT FOR VIRTUAL VISIT FOR:  [name] [name]  By participating in this virtual visit I agree to the following:  I hereby voluntarily request, consent and authorize [facility] and its employed or contracted physicians, physician assistants, nurse practitioners or other licensed health care professionals (the Practitioner), to provide me with telemedicine health care services (the "Services") as deemed necessary by the treating Practitioner. I acknowledge and consent to receive the Services by the Practitioner via telemedicine. I understand that the telemedicine visit will involve communicating with the Practitioner through live audiovisual communication technology and the disclosure of certain medical information by electronic transmission. I acknowledge that I have been given the opportunity to request an in-person assessment or other available alternative prior to the telemedicine visit and am voluntarily participating in the telemedicine visit.  I understand that I have the right to withhold or withdraw my consent to the use of telemedicine in the course of my care at any time, without affecting my right to future care or treatment, and that the Practitioner or I may terminate the telemedicine visit at any time. I understand that I have the right to inspect all information obtained and/or recorded in the course of the telemedicine visit and may receive copies of available information for a reasonable fee.  I understand that some of the potential risks of receiving the Services via telemedicine include:  Delay or interruption in medical evaluation due to technological equipment failure or disruption; Information transmitted may not be sufficient (e.g. poor resolution of images) to allow for appropriate medical decision making by the Practitioner;  and/or  In rare instances, security protocols could fail, causing a breach of personal health information.  Furthermore, I acknowledge that it is my responsibility to provide information about my medical history, conditions and care that is complete and accurate to the best of my ability. I acknowledge that Practitioner's advice, recommendations, and/or decision may be based on factors not within their control, such as incomplete or inaccurate data provided by me or distortions of diagnostic images or specimens that may result from electronic transmissions. I understand that the practice of medicine is not an exact science and that Practitioner makes no warranties or guarantees regarding treatment outcomes. I acknowledge that a copy of this consent can be made available to me via my patient portal [facility] MyChart), or I can request a printed copy by calling the office of [facility].    I understand that my insurance will be billed for this visit.   I have read or had this consent read to me. I understand the contents of this consent, which adequately explains the benefits and risks of the Services being provided via telemedicine.  I have been provided ample opportunity to ask questions regarding this consent and the Services and have had my questions answered to my satisfaction. I give my informed consent for the services to be provided through the use of telemedicine in my medical care

## 2023-11-30 NOTE — Telephone Encounter (Signed)
 Patient with diagnosis of DVT/PE on Xarelto for anticoagulation.    Procedure: Cholecystectomy  Date of procedure: TBD  She does have lupus anticoagulant with extensive lower extremity DVT and pulmonary emboli status post endovascular therapy with stenting of her lower extremity venous vasculature as well as placement of an IVC filte.  CrCl 67 ml/min Platelet count 526  Per office protocol, patient can hold Xarelto for 2 days prior to procedure.    Please resume Xarelto as soon as safely possible. Patient previously cleared by Dr. Addie Holstein to hold 2 days (02/02/2019)  **This guidance is not considered finalized until pre-operative APP has relayed final recommendations.**

## 2023-11-30 NOTE — Telephone Encounter (Signed)
 Correction: pt tele appt is 12/03/23 @ 3 pm.

## 2023-11-30 NOTE — Telephone Encounter (Signed)
   Name: [name] [name]  DOB: 06/27/1956  MRN: [id number]  Primary Cardiologist: Randene Bustard, MD   Preoperative team, please contact this patient and set up a phone call appointment for further preoperative risk assessment. Please obtain consent and complete medication review. Thank you for your help.  I confirm that guidance regarding antiplatelet and oral anticoagulation therapy has been completed and, if necessary, noted below.  Per office protocol, patient can hold Xarelto for 2 days prior to procedure.     Please resume Xarelto as soon as safely possible. Patient previously cleared by Dr. Addie Holstein to hold 2 days (02/02/2019)  I also confirmed the patient resides in the state of [place] . As per [facility] Medical Board telemedicine laws, the patient must reside in the state in which the provider is licensed.   Ava Boatman, NP 11/30/2023, 12:05 PM [facility]

## 2023-11-30 NOTE — Telephone Encounter (Signed)
 Spoke with patient who is agreeable to do a tele visit on 4/18 at 3 pm. Med rec and consent have been done.

## 2023-12-03 ENCOUNTER — Encounter (HOSPITAL_COMMUNITY): Payer: Self-pay | Admitting: General Surgery

## 2023-12-03 ENCOUNTER — Other Ambulatory Visit: Payer: Self-pay

## 2023-12-03 ENCOUNTER — Ambulatory Visit: Attending: Nurse Practitioner

## 2023-12-03 DIAGNOSIS — Z01810 Encounter for preprocedural cardiovascular examination: Secondary | ICD-10-CM | POA: Diagnosis not present

## 2023-12-03 NOTE — Progress Notes (Signed)
 Virtual Visit via Telephone Note   Because of [name] [name] co-morbid illnesses, she is at least at moderate risk for complications without adequate follow up.  This format is felt to be most appropriate for this patient at this time.  Due to technical limitations with video connection (technology), today's appointment will be conducted as an audio only telehealth visit, and [name] [name] verbally agreed to proceed in this manner.   All issues noted in this document were discussed and addressed.  No physical exam could be performed with this format.  Evaluation Performed:  Preoperative cardiovascular risk assessment _____________   Date:  12/03/2023   Patient ID:  [name] [name], DOB Jan 04, 1956, MRN [id number] Patient Location:  Home Provider location:   Office  Primary Care Provider:  Deeann Saint, MD Primary Cardiologist:  Bryan Lemma, MD  Chief Complaint / Patient Profile   68 y.o. y/o female with a h/o CAD s/p DES to LAD and LCX in 08/2018, PAD with right above-the-knee popliteal artery CTO and left SFA CTO s/p popliteal directional atherectomy, HTN, HLD, DVT and PE, CVA who is pending cholecystectomy and presents today for telephonic preoperative cardiovascular risk assessment.  History of Present Illness    [name] [name] is a 68 y.o. female who presents via audio/video conferencing for a telehealth visit today.  Pt was last seen in cardiology clinic on 09/21/2023 by Dr. Allyson Sabal.  At that time [name] [name] was doing well with no leg pain, chest pain or SOB.  The patient is now pending procedure as outlined above. Since her last visit, she has been doing well with no new cardiac complaints.  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  She does lots of walking daily and denies any chest or leg discomfort with ambulation.  She is able to complete greater than 4 METS of activity without any difficulty.  She denies chest pain, shortness of breath, lower  extremity edema, fatigue, palpitations, melena, hematuria, hemoptysis, diaphoresis, weakness, presyncope, syncope, orthopnea, and PND.    Past Medical History    Past Medical History:  Diagnosis Date   Back pain    CAD S/P percutaneous coronary angioplasty 09/13/2018   09/13/2018 - Cath & Staged DES PCI on 09/14/2018: DES PCI: Cx99%&55% - Resolute Onyx DES 3.5 x 30 (3.6 mm), p-mLAD (prox of D1 almost to D2) - Resolute Onyx DES 3.0 x 22 (3.3 mm); DFR on pRCA 70% - 0.99, Not significant -> Rec Med Rx.   DVT (deep venous thrombosis) (HCC)    Heart attack (HCC) 09/09/2018   Hypertension    Lupus anticoagulant syndrome (HCC)    NSTEMI (non-ST elevated myocardial infarction) (HCC) 09/12/2018   Initial presentation with chest pain was on 09/09/2018, recurrent pain on 1/26 2020 with positive troponins.  Cath showed three-vessel CAD -> declined CABG, opted for two-vessel DES PCI (LAD and CX, negative DFR RCA)   Pulmonary embolism (HCC)    Past Surgical History:  Procedure Laterality Date   ABDOMINAL AORTOGRAM W/LOWER EXTREMITY N/A 06/02/2022   Procedure: ABDOMINAL AORTOGRAM W/LOWER EXTREMITY;  Surgeon: Runell Gess, MD;  Location: [facility] INVASIVE CV LAB;  Service: Cardiovascular;  Laterality: N/A;   ABDOMINAL AORTOGRAM W/LOWER EXTREMITY N/A 03/19/2023   Procedure: ABDOMINAL AORTOGRAM W/LOWER EXTREMITY;  Surgeon: Runell Gess, MD;  Location: [facility];  Service: Cardiovascular;  Laterality: N/A;   APPLICATION OF WOUND VAC Left 06/06/2022   Procedure: APPLICATION OF WOUND VAC  LEFT GROIN;  Surgeon: Victorino Sparrow, MD;  Location[facility] OR;  Service: Vascular;  Laterality: Left;   ARTERY REPAIR Left 06/06/2022   Procedure: LEFT COMMON FEMORAL ARTERY REPAIR WITH MORCELLS;  Surgeon: Victorino Sparrow, MD;  Location: [facility] OR;  Service: Vascular;  Laterality: Left;   BACK SURGERY     COLONOSCOPY  05/19/2012   Procedure: COLONOSCOPY;  Surgeon: Theda Belfast, MD;  Location: [facility] ENDOSCOPY;  Service:  Endoscopy;  Laterality: N/A;   COLONOSCOPY WITH PROPOFOL N/A 09/30/2018   Procedure: COLONOSCOPY WITH PROPOFOL;  Surgeon: Benancio Deeds, MD;  Location: [facility] ENDOSCOPY;  Service: Gastroenterology;  Laterality: N/A;   CORONARY PRESSURE/FFR STUDY N/A 09/14/2018   Procedure: INTRAVASCULAR PRESSURE WIRE/FFR STUDY;  Surgeon: Swaziland, Peter M, MD;  Location: [facility] INVASIVE CV LAB;  Service: Cardiovascular;  Laterality: RCA: DFR on pRCA 70% - 0.99, Not significant -> Rec Med Rx.   CORONARY STENT INTERVENTION N/A 09/14/2018   Procedure: CORONARY STENT INTERVENTION;  Surgeon: Swaziland, Peter M, MD;  Location: [facility] INVASIVE CV LAB;  Service: Cardiovascular: September 14, 2018- DES PCI: Cx99%&55% - Resolute Onyx DES 3.5 x 30 (3.6 mm), p-mLAD (prox of D1 almost to D2) - Resolute Onyx DES 3.0 x 22 (3.3 mm); DFR on pRCA 70% - 0.99, Not significant -> Rec Med Rx.   ESOPHAGOGASTRODUODENOSCOPY  05/19/2012   Procedure: ESOPHAGOGASTRODUODENOSCOPY (EGD);  Surgeon: Theda Belfast, MD;  Location: [facility] ENDOSCOPY;  Service: Endoscopy;  Laterality: N/A;   ESOPHAGOGASTRODUODENOSCOPY (EGD) WITH PROPOFOL N/A 09/30/2018   Procedure: ESOPHAGOGASTRODUODENOSCOPY (EGD) WITH PROPOFOL;  Surgeon: Benancio Deeds, MD;  Location: [facility] ENDOSCOPY;  Service: Gastroenterology;  Laterality: N/A;   GIVENS CAPSULE STUDY N/A 09/30/2018   Procedure: GIVENS CAPSULE STUDY;  Surgeon: Benancio Deeds, MD;  Location: [facility] ENDOSCOPY;  Service: Gastroenterology;  Laterality: N/A;   HEMATOMA EVACUATION Left 06/06/2022   Procedure: EVACUATION HEMATOMA LEFT GROIN;  Surgeon: Victorino Sparrow, MD;  Location: [facility] OR;  Service: Vascular;  Laterality: Left;   HOT HEMOSTASIS N/A 09/30/2018   Procedure: HOT HEMOSTASIS (ARGON PLASMA COAGULATION/BICAP);  Surgeon: Benancio Deeds, MD;  Location: [facility] ENDOSCOPY;  Service: Gastroenterology;  Laterality: N/A;   LEFT HEART CATH AND CORONARY ANGIOGRAPHY N/A 09/13/2018   Procedure: LEFT HEART CATH AND CORONARY ANGIOGRAPHY;   Surgeon: Marykay Lex, MD;  Location: [facility] INVASIVE CV LAB;  Service: Cardiovascular:: CULPRIT LESION 99% p-mCx followed by 55%mCx; pLAD 35% A D4followed by long 80% lesion @ SP1); pRCA 70%. Normal LVEDP. Global HK EF ~45%.  -Decision on PCI deferred until discussion about PCI versus CABG.  Concern because of long-term DOAC.   PERIPHERAL VASCULAR ATHERECTOMY  06/02/2022   Procedure: PERIPHERAL VASCULAR ATHERECTOMY;  Surgeon: Runell Gess, MD;  Location: [facility] INVASIVE CV LAB;  Service: Cardiovascular;;   PERIPHERAL VASCULAR BALLOON ANGIOPLASTY  06/02/2022   Procedure: PERIPHERAL VASCULAR BALLOON ANGIOPLASTY;  Surgeon: Runell Gess, MD;  Location: [facility] INVASIVE CV LAB;  Service: Cardiovascular;;   TRANSTHORACIC ECHOCARDIOGRAM  09/13/2018   Mildly reduced EF 45 to 50% with diffuse HK.  GR 1 DD.  Mild aortic valve calcification.  MAC.  Moderate pulmonic regurgitation.   WOUND EXPLORATION Left 06/06/2022   Procedure: LEFT GROIN EXPLORATION;  Surgeon: Victorino Sparrow, MD;  Location: [facility] OR;  Service: Vascular;  Laterality: Left;    Allergies  Allergies  Allergen Reactions   Prednisone Other (See Comments)    No energy "stalls out".    Makes pt  Jittery.  **makes pt have no  energy**   Tramadol Other (See Comments) and Nausea Only    Constipation,  Makes pt  Jittery.  constipation   Crestor [Rosuvastatin Calcium]     Feels fatigued, RE  CHALLENGE - UNABLE TO USE 12/28/18   Hctz [Hydrochlorothiazide]     Dizzy    Repatha [Evolocumab] Other (See Comments)    Per patient never took this medication   Zithromax [Azithromycin] Other (See Comments)    jittery    Home Medications    Prior to Admission medications   Medication Sig Start Date End Date Taking? Authorizing Provider  acetaminophen (TYLENOL) 650 MG CR tablet Take 650 mg by mouth daily as needed for pain.    [provider]  amLODipine (NORVASC) 2.5 MG tablet TAKE 1 TABLET BY MOUTH TWICE A DAY 07/09/23   Maisie Fus, MD  bisacodyl (DULCOLAX) 5 MG EC tablet Take 5 mg by mouth daily as needed for moderate constipation.    [provider]  carvedilol (COREG) 6.25 MG tablet TAKE 0.5 TABLETS (3.125 MG TOTAL) BY MOUTH 2 (TWO) TIMES DAILY WITH A MEAL. 08/07/23   Runell Gess, MD  ezetimibe (ZETIA) 10 MG tablet Take 1 tablet (10 mg total) by mouth daily. Patient not taking: Reported on 12/01/2023 09/21/23 12/20/23  Runell Gess, MD  fenofibrate (TRICOR) 145 MG tablet TAKE 1 TABLET (145 MG TOTAL) BY MOUTH DAILY. TAKE WITH A MEAL. 12/29/22   Marykay Lex, MD  lisinopril (ZESTRIL) 40 MG tablet TAKE 1 TABLET BY MOUTH EVERY DAY 07/09/23   Maisie Fus, MD  nitroGLYCERIN (NITROSTAT) 0.4 MG SL tablet Place 1 tablet (0.4 mg total) under the tongue every 5 (five) minutes as needed for up to 3 doses for chest pain. 04/10/23   Runell Gess, MD  pravastatin (PRAVACHOL) 40 MG tablet Take 1 tablet (40 mg total) by mouth every evening. 04/06/23 12/01/26  Marykay Lex, MD  XARELTO 20 MG TABS tablet TAKE 1 TABLET BY MOUTH DAILY WITH SUPPER. 09/07/23   Marykay Lex, MD    Physical Exam    Vital Signs:  [name] [name] does not have vital signs available for review today.  Given telephonic nature of communication, physical exam is limited. AAOx3. NAD. Normal affect.  Speech and respirations are unlabored.  Accessory Clinical Findings    None  Assessment & Plan    1.  Preoperative Cardiovascular Risk Assessment: - Patient's RCRI score is 11%  The patient affirms she has been doing well without any new cardiac symptoms. They are able to achieve 7 METS without cardiac limitations. Therefore, based on ACC/AHA guidelines, the patient would be at acceptable risk for the planned procedure without further cardiovascular testing. The patient was advised that if she develops new symptoms prior to surgery to contact our office to arrange for a follow-up visit, and she verbalized understanding.   The patient  was advised that if she develops new symptoms prior to surgery to contact our office to arrange for a follow-up visit, and she verbalized understanding.  Per office protocol, patient can hold Xarelto for 2 days prior to procedure with no instruction to bridge with Lovenox per clinical pharmacist recommendation.  A copy of this note will be routed to requesting surgeon.  Time:   Today, I have spent 6 minutes with the patient with telehealth technology discussing medical history, symptoms, and management plan.     Napoleon Form, Leodis Rains, NP  12/03/2023, 7:28 AM

## 2023-12-03 NOTE — Progress Notes (Signed)
 Anesthesia Chart Review: Maury Dus  Case: [id number] Date/Time: 12/07/23 1415   Procedure: LAPAROSCOPIC CHOLECYSTECTOMY   Anesthesia type: General   Diagnosis: Cholecystitis, acute [K81.0]   Pre-op diagnosis: CHOLECYSTITIS   [facility] / [facility]   Surgeons: Moise Boring, MD       DISCUSSION: Patient is a 68 year old female scheduled for the above procedure.  History includes smoking, CAD (NSTEMI, DES LCX, DES LAD 09/14/18), HTN, PAD (right SFA/popliteal artery atherectomy, DCB 06/02/22; PTA/DCB/stent right SFA/popliteal artery 03/19/23), DVT/PE (post-op LLE DVT & bilateral PE 04/20/12, RLE DVT 05/18/12, suprarenal IVC filter 05/18/12 due to GI bleed), lupus anticoagulant syndrome, spinal surgery (L4-5 PLIF 04/05/12)  She underwent lumbar fusion in August 2013. At postoperative visit LLE noted to be swollen.  Subsequent venous US and CT of the chest revealed LLE DVT and bilateral PE 04/20/12.  She was started on Lovenox bridge and warfarin.  On 05/18/2012 she presented with GI bleed and new RLE DVT.  She required PRBC. IVC filter placed 05/18/12, but suprarenal placement secondary to extensive near occlusive thrombus extending throughout the IVC with non-visualization of the left renal vein. She underwent thrombolysis followed by angioplasty and stenting of the IVC and BLE veins on 05/24/12. IVC filter was retrieved 07/22/12.  Hypercoagulable workup was positive for lupus anticoagulant. She was initially managed with Lovenox given new DVT while on warfarin and was later transitioned to rivaroxaban.  She had NSTEMI on January 2020. LVEF 45-50%. She opted for PCI over CABG and had DES to LCX and LAD on 09/14/18. 70% proximal RCA disease was treated medically as it had normal DRF of 0.99. She had another GI bleed while on Xarelto with ASA and Plavix. She was managed on Plavix for a year post DES and then started on monotherapy with Xarelto due to bleeding issues. She did not tolerate Repatha.  Currently on pravastatin and fenofibrate, as well as amlodipine, Coreg, lisinopril.  He is followed by cardiologists Dr. Herbie Baltimore and Dr. Allyson Sabal (PV).   She had a preoperative cardiology telephonic evaluation done on 12/03/2023 by Neila Gear, NP. He wrote, "Patient's RCRI score is 11%   The patient affirms she has been doing well without any new cardiac symptoms. They are able to achieve 7 METS without cardiac limitations. Therefore, based on ACC/AHA guidelines, the patient would be at acceptable risk for the planned procedure without further cardiovascular testing...  Per office protocol, patient can hold Xarelto for 2 days prior to procedure with no instruction to bridge with Lovenox per clinical pharmacist recommendation."  Anesthesia team to evaluate on the day of surgery. Updated labs on arrival as indicated. As of 11/12/23, Cr 0.86, glucose 91, H/H 11.6/34.9, PLT 526K.   VS: Ht 5\' 4"  (1.626 m)   Wt 68.3 kg   BMI 25.85 kg/m  BP Readings from Last 3 Encounters:  11/12/23 (!) 160/80  10/26/23 (!) 158/78  09/21/23 126/84   Pulse Readings from Last 3 Encounters:  11/12/23 87  10/26/23 96  09/21/23 95     PROVIDERS: Deeann Saint, MD is PCP Bryan Lemma, MD is primary cardiologist  Nanetta Batty, MD is [facility] cardiologist  Sherral Hammers, MD is vascular surgeon   LABS: For day of surgery as indicated.  Most recent labs results in [facility] include: Lab Results  Component Value Date   WBC 8.2 11/12/2023   HGB 11.6 (L) 11/12/2023   HCT 34.9 (L) 11/12/2023   PLT 526.0 (H) 11/12/2023   GLUCOSE 91  11/12/2023   CHOL 177 03/20/2023   TRIG 248 (H) 03/20/2023   HDL 31 (L) 03/20/2023   LDLDIRECT 141.0 11/25/2018   LDLCALC 96 03/20/2023   ALT 11 11/12/2023   AST 18 11/12/2023   NA 137 11/12/2023   K 3.4 (L) 11/12/2023   CL 102 11/12/2023   CREATININE 0.86 11/12/2023   BUN 13 11/12/2023   CO2 26 11/12/2023   TSH 1.050 02/27/2022   INR 1.4 (H) 06/06/2022     IMAGES: RUQ Abd US   11/20/23: IMPRESSION: Distended, fluid-filled gallbladder containing multiple small stones with mild wall thickening. In the absence of pericholecystic fluid and a sonographic Murphy sign, the study is equivocal for acute cholecystitis. If this remains of clinical concern, a nuclear medicine hepatobiliary scan would be recommended.   EKG: 04/10/23: Normal sinus rhythm Septal infarct , age undetermined When compared with ECG of 03-Feb-2022 01:29, Premature ventricular complexes are no longer Present Confirmed by Lauro Portal [phone]) on 04/10/2023 3:02:21 PM  CV: Zio Patch Wear Time 02/18/22 - 03/03/22:   Predominant underlying rhythm was Sinus Rhythm: HR Range 59-128 bpm, Avg 86 bpm   31 Atrial Runs: Fastest: 17 beats, HR Range 94 - 184 bpm, avg 136 bpm, 7.3 sec; Longest 39  beats, HR Range 76 - 144 bpm, Avg 107 bpm, 22.9 Sec.   Rare Isolated PACs & PVCs with very rare copulets & triplets.  Rare Ventriculat Bigeminy noted.   No Sustained Arrhythmias: Atrial tachycardia (AT) supraventricular tachycardia (SVT), atrial fibrillation (A-fib), atrial flutter (A-flutter), ventricular tachycardia (VT)   Additionally, symptoms noted PVCs, not with Atrial Runs.   Relatively normal study with fair number of asymptomatic atrial runs.  No sustained arrhythmias.   Symptoms are noted with rare PVCs. Relatively benign study.   LHC 09/13/18: LV end diastolic pressure is normal. Prox Cx to Mid Cx lesion is 99% stenosed (CULPRIT LESION). Mid Cx lesion is 55% stenosed. Prox LAD-1 lesion is 35% stenosed with 20% stenosed side branch in Ost 1st Diag. Prox LAD-2 lesion is 80% stenosed with 60% stenosed side branch in Ost 1st Sept. Prox RCA lesion is 70% stenosed.   SUMMARY Severe three-vessel disease with culprit lesion being 99% mid circumflex stenosis (followed by 60% lesion just beyond it), additional 80% proximal to mid LAD and likely 70% proximal RCA. Normal LVEDP. Reduced EF by echocardiogram with  global hypokinesis.   RECOMMENDATION Reviewed images with interventional colleague, will review with additional colleagues.  All 3 lesions are PCI amenable, but would likely require at least long stents in the LAD and circumflex and a moderate size stent in the RCA.  Concern going forward would be need for long-term antiplatelet therapy in a patient who has mandatory long-term anticoagulation with Xarelto. Will review images with interventional colleagues, would want to determine duration of antiplatelet therapy prior to deciding  PCI We will consult CVTS to provide surgical option with risks and benefits as well...  PCI 09/14/18: Prox LAD-1 lesion is 35% stenosed with 20% stenosed side branch in Ost 1st Diag. Prox LAD-2 lesion is 80% stenosed with 90% stenosed side branch in Ost 1st Sept. A drug-eluting stent was successfully placed using a STENT RESOLUTE ONYX 3.0X22. Post intervention, there is a 0% residual stenosis. Prox Cx to Mid Cx lesion is 99% stenosed. Mid Cx lesion is 55% stenosed. A drug-eluting stent was successfully placed using a STENT RESOLUTE ONYX 3.5X30. Post intervention, there is a 0% residual stenosis. Prox RCA lesion is 70% stenosed. DFR was 0.99  1. Successful PCI of the mid LCx with DES x 1 2. Successful PCI of the proximal to mid LAD with DES x 1. 3. Proximal RCA lesion with normal DFR of 0.99   Plan: ASA 81 mg daily for one month. Plavix 75 mg daily for one year. Plan resuming Xarelto 20 mg daily tomorrow. Anticipate DC tomorrow.    Echo 09/13/18: Study Conclusions  - Left ventricle: The cavity size was normal. Systolic function was    mildly reduced. The estimated ejection fraction was in the range    of 45% to 50%. Diffuse hypokinesis. Doppler parameters are    consistent with abnormal left ventricular relaxation (grade 1    diastolic dysfunction). Doppler parameters are consistent with    high ventricular filling pressure.  - Aortic valve: Trileaflet;  mildly thickened, mildly calcified    leaflets.  - Mitral valve: Calcified annulus. There was trivial regurgitation.  - Pulmonic valve: There was moderate regurgitation.    Past Medical History:  Diagnosis Date   Back pain    CAD S/P percutaneous coronary angioplasty 09/13/2018   09/13/2018 - Cath & Staged DES PCI on 09/14/2018: DES PCI: Cx99%&55% - Resolute Onyx DES 3.5 x 30 (3.6 mm), p-mLAD (prox of D1 almost to D2) - Resolute Onyx DES 3.0 x 22 (3.3 mm); DFR on pRCA 70% - 0.99, Not significant -> Rec Med Rx.   DVT (deep venous thrombosis) (HCC)    Heart attack (HCC) 09/09/2018   Hypertension    Lupus anticoagulant syndrome (HCC)    NSTEMI (non-ST elevated myocardial infarction) (HCC) 09/12/2018   Initial presentation with chest pain was on 09/09/2018, recurrent pain on 1/26 2020 with positive troponins.  Cath showed three-vessel CAD -> declined CABG, opted for two-vessel DES PCI (LAD and CX, negative DFR RCA)   Pulmonary embolism (HCC)     Past Surgical History:  Procedure Laterality Date   ABDOMINAL AORTOGRAM W/LOWER EXTREMITY N/A 06/02/2022   Procedure: ABDOMINAL AORTOGRAM W/LOWER EXTREMITY;  Surgeon: Avanell Leigh, MD;  [facility] IN[facility] CV [facility];  Service: Cardiovascular;  Laterality: N/A;   ABDOMINAL AORTOGRAM W/LOWER EXTREMITY N/A 03/19/2023   Procedure: ABDOMINAL AORTOGRAM W/LOWER EXTREMITY;  Surgeon: Avanell Leigh, MD;  [facility];  Service: Cardiovascular;  Laterality: N/A;   APPLICATION OF WOUND VAC Left 06/06/2022   Procedure: APPLICATION OF WOUND VAC LEFT GROIN;  Surgeon: Kayla Part, MD;  Location: [facility] OR;  Service: Vascular;  Laterality: Left;   ARTERY REPAIR Left 06/06/2022   Procedure: LEFT COMMON FEMORAL ARTERY REPAIR WITH MORCELLS;  Surgeon: Kayla Part, MD;  Location: [facility] OR;  Service: Vascular;  Laterality: Left;   BACK SURGERY     COLONOSCOPY  05/19/2012   Procedure: COLONOSCOPY;  Surgeon: Almeda Aris, MD;  Location: [facility]  ENDOSCOPY;  Service: Endoscopy;  Laterality: N/A;   COLONOSCOPY WITH PROPOFOL N/A 09/30/2018   Procedure: COLONOSCOPY WITH PROPOFOL;  Surgeon: Ace Holder, MD;  Location: [facility] ENDOSCOPY;  Service: Gastroenterology;  Laterality: N/A;   CORONARY PRESSURE/FFR STUDY N/A 09/14/2018   Procedure: INTRAVASCULAR PRESSURE WIRE/FFR STUDY;  Surgeon: Swaziland, Peter M, MD;  Location: [facility] INVASIVE CV LAB;  Service: Cardiovascular;  Laterality: RCA: DFR on pRCA 70% - 0.99, Not significant -> Rec Med Rx.   CORONARY STENT INTERVENTION N/A 09/14/2018   Procedure: CORONARY STENT INTERVENTION;  Surgeon: Swaziland, Peter M, MD;  Location: [facility] INVASIVE CV LAB;  Service: Cardiovascular: September 14, 2018- DES PCI: Cx99%&55% - Resolute Onyx DES 3.5 x  30 (3.6 mm), p-mLAD (prox of D1 almost to D2) - Resolute Onyx DES 3.0 x 22 (3.3 mm); DFR on pRCA 70% - 0.99, Not significant -> Rec Med Rx.   ESOPHAGOGASTRODUODENOSCOPY  05/19/2012   Procedure: ESOPHAGOGASTRODUODENOSCOPY (EGD);  Surgeon: Almeda Aris, MD;  Location: [facility] ENDOSCOPY;  Service: Endoscopy;  Laterality: N/A;   ESOPHAGOGASTRODUODENOSCOPY (EGD) WITH PROPOFOL N/A 09/30/2018   Procedure: ESOPHAGOGASTRODUODENOSCOPY (EGD) WITH PROPOFOL;  Surgeon: Ace Holder, MD;  Location: [facility] ENDOSCOPY;  Service: Gastroenterology;  Laterality: N/A;   GIVENS CAPSULE STUDY N/A 09/30/2018   Procedure: GIVENS CAPSULE STUDY;  Surgeon: Ace Holder, MD;  Location: [facility] ENDOSCOPY;  Service: Gastroenterology;  Laterality: N/A;   HEMATOMA EVACUATION Left 06/06/2022   Procedure: EVACUATION HEMATOMA LEFT GROIN;  Surgeon: Kayla Part, MD;  Location: [facility] OR;  Service: Vascular;  Laterality: Left;   HOT HEMOSTASIS N/A 09/30/2018   Procedure: HOT HEMOSTASIS (ARGON PLASMA COAGULATION/BICAP);  Surgeon: Ace Holder, MD;  Location: [facility] ENDOSCOPY;  Service: Gastroenterology;  Laterality: N/A;   LEFT HEART CATH AND CORONARY ANGIOGRAPHY N/A 09/13/2018   Procedure: LEFT HEART CATH AND  CORONARY ANGIOGRAPHY;  Surgeon: Arleen Lacer, MD;  Location: [facility] INVASIVE CV LAB;  Service: Cardiovascular:: CULPRIT LESION 99% p-mCx followed by 55%mCx; pLAD 35% A D41followed by long 80% lesion @ SP1); pRCA 70%. Normal LVEDP. Global HK EF ~45%.  -Decision on PCI deferred until discussion about PCI versus CABG.  Concern because of long-term DOAC.   PERIPHERAL VASCULAR ATHERECTOMY  06/02/2022   Procedure: PERIPHERAL VASCULAR ATHERECTOMY;  Surgeon: Avanell Leigh, MD;  Location: [facility] INVASIVE CV LAB;  Service: Cardiovascular;;   PERIPHERAL VASCULAR BALLOON ANGIOPLASTY  06/02/2022   Procedure: PERIPHERAL VASCULAR BALLOON ANGIOPLASTY;  Surgeon: Avanell Leigh, MD;  [facility];  Service: Cardiovascular;;   TRANSTHORACIC ECHOCARDIOGRAM  09/13/2018   Mildly reduced EF 45 to 50% with diffuse HK.  GR 1 DD.  Mild aortic valve calcification.  MAC.  Moderate pulmonic regurgitation.   WOUND EXPLORATION Left 06/06/2022   Procedure: LEFT GROIN EXPLORATION;  Surgeon: Kayla Part, MD;  Location: [facility] OR;  Service: Vascular;  Laterality: Left;    MEDICATIONS: No current facility-administered medications for this encounter.    acetaminophen (TYLENOL) 650 MG CR tablet   amLODipine (NORVASC) 2.5 MG tablet   bisacodyl (DULCOLAX) 5 MG EC tablet   carvedilol (COREG) 6.25 MG tablet   fenofibrate (TRICOR) 145 MG tablet   lisinopril (ZESTRIL) 40 MG tablet   nitroGLYCERIN (NITROSTAT) 0.4 MG SL tablet   pravastatin (PRAVACHOL) 40 MG tablet   XARELTO 20 MG TABS tablet   ezetimibe (ZETIA) 10 MG tablet    Ella Gun, PA-C Surgical Short Stay/Anesthesiology [facility] Phone [phone] [facility] Phone [phone] 12/03/2023 5:12 PM

## 2023-12-03 NOTE — Anesthesia Preprocedure Evaluation (Addendum)
 Anesthesia Evaluation  Patient identified by MRN, date of birth, ID band Patient awake    Reviewed: Allergy & Precautions, NPO status , Patient's Chart, lab work & pertinent test results, reviewed documented beta blocker date and time   History of Anesthesia Complications Negative for: history of anesthetic complications  Airway Mallampati: II  TM Distance: >3 FB Neck ROM: Full    Dental  (+) Dental Advisory Given   Pulmonary Current SmokerPatient did not abstain from smoking., PE   Pulmonary exam normal        Cardiovascular hypertension, Pt. on medications and Pt. on home beta blockers + CAD, + Past MI, + Peripheral Vascular Disease, +CHF and + DVT  Normal cardiovascular exam   '20 Cath - Prox LAD-1 lesion is 35% stenosed with 20% stenosed side branch in Ost 1st Diag. Prox LAD-2 lesion is 80% stenosed with 90% stenosed side branch in Ost 1st Sept. A drug-eluting stent was successfully placed using a STENT RESOLUTE ONYX 3.0X22. Post intervention, there is a 0% residual stenosis. Prox Cx to Mid Cx lesion is 99% stenosed. Mid Cx lesion is 55% stenosed. A drug-eluting stent was successfully placed using a STENT RESOLUTE ONYX 3.5X30. Post intervention, there is a 0% residual stenosis. Prox RCA lesion is 70% stenosed. DFR was 0.99  '20 TTE - EF 45% to 50%. Diffuse hypokinesis. Grade 1 diastolic dysfunction. Trivial MR, moderate PR.    Neuro/Psych negative neurological ROS  negative psych ROS   GI/Hepatic negative GI ROS, Neg liver ROS,,,  Endo/Other  negative endocrine ROS    Renal/GU negative Renal ROS     Musculoskeletal negative musculoskeletal ROS (+)    Abdominal   Peds  Hematology  Lupus anticoagulant syndrome On xarelto     Anesthesia Other Findings   Reproductive/Obstetrics                              Anesthesia Physical Anesthesia Plan  ASA: 3  Anesthesia Plan:  General   Post-op Pain Management: Tylenol  PO (pre-op)*   Induction: Intravenous  PONV Risk Score and Plan: 3 and Treatment may vary due to age or medical condition, Ondansetron  and Dexamethasone  Airway Management Planned: Oral ETT  Additional Equipment: None  Intra-op Plan:   Post-operative Plan: Extubation in OR  Informed Consent: I have reviewed the patients History and Physical, chart, labs and discussed the procedure including the risks, benefits and alternatives for the proposed anesthesia with the patient or authorized representative who has indicated his/her understanding and acceptance.     Dental advisory given  Plan Discussed with: CRNA and Anesthesiologist  Anesthesia Plan Comments: (PAT note written)        Anesthesia Quick Evaluation

## 2023-12-03 NOTE — Progress Notes (Signed)
 PCP - Viola Greulich, MD  Cardiologist - Arleen Lacer, MD   PPM/ICD - denies Device Orders - n/a Rep Notified - n/a  Chest x-ray - 04-10-23 EKG - 04-10-23 Stress Test -  ECHO - 09-13-18 Cardiac Cath - 09-13-18  CPAP - denies  DM -denies  Blood Thinner Instructions: XARELTO Hold 2 days prior to procedure and per patient awaiting a phone appointment at 3:00 p.m. today for bridge instructions to Lovenox Aspirin Instructions: denies  ERAS Protcol - clear liquids until 11:30  COVID TEST- no  Anesthesia review: yes  Patient verbally denies any shortness of breath, fever, cough and chest pain during phone call   -------------  SDW INSTRUCTIONS given:  Your procedure is scheduled on December 07, 2023.  Report to [facility] "A" at 12:00 P.M., and check in at the Admitting office.  Call this number if you have problems the morning of surgery:  [phone]   Remember:  Do not eat after midnight the night before your surgery  You may drink clear liquids until 11:30 the morning of your surgery.   Clear liquids allowed are: Water, Non-Citrus Juices (without pulp), Carbonated Beverages, Clear Tea, Black Coffee Only, and Gatorade    Take these medicines the morning of surgery with A SIP OF WATER  amLODipine (NORVASC)  acetaminophen (TYLENOL)  carvedilol (COREG)  fenofibrate (TRICOR)  nitroGLYCERIN (NITROSTAT)   As of today, STOP taking any Aspirin (unless otherwise instructed by your surgeon) Aleve, Naproxen, Ibuprofen, Motrin, Advil, Goody's, BC's, all herbal medications, fish oil, and all vitamins.                      Do not wear jewelry, make up, or nail polish            Do not wear lotions, powders, perfumes/colognes, or deodorant.            Do not shave 48 hours prior to surgery.  Men may shave face and neck.            Do not bring valuables to the hospital.            [facility] is not responsible for any belongings or valuables.  Do NOT Smoke  (Tobacco/Vaping) 24 hours prior to your procedure If you use a CPAP at night, you may bring all equipment for your overnight stay.   Contacts, glasses, dentures or bridgework may not be worn into surgery.      For patients admitted to the hospital, discharge time will be determined by your treatment team.   Patients discharged the day of surgery will not be allowed to drive home, and someone needs to stay with them for 24 hours.    Special instructions:   [facility]- Preparing For Surgery  Before surgery, you can play an important role. Because skin is not sterile, your skin needs to be as free of germs as possible. You can reduce the number of germs on your skin by washing with CHG (chlorahexidine gluconate) Soap before surgery.  CHG is an antiseptic cleaner which kills germs and bonds with the skin to continue killing germs even after washing.    Oral Hygiene is also important to reduce your risk of infection.  Remember - BRUSH YOUR TEETH THE MORNING OF SURGERY WITH YOUR REGULAR TOOTHPASTE  Please do not use if you have an allergy to CHG or antibacterial soaps. If your skin becomes reddened/irritated stop using the CHG.  Do  not shave (including legs and underarms) for at least 48 hours prior to first CHG shower. It is OK to shave your face.  Please follow these instructions carefully.   Shower the NIGHT BEFORE SURGERY and the MORNING OF SURGERY with DIAL Soap.   Pat yourself dry with a CLEAN TOWEL.  Wear CLEAN PAJAMAS to bed the night before surgery  Place CLEAN SHEETS on your bed the night of your first shower and DO NOT SLEEP WITH PETS.   Day of Surgery: Please shower morning of surgery  Wear Clean/Comfortable clothing the morning of surgery Do not apply any deodorants/lotions.   Remember to brush your teeth WITH YOUR REGULAR TOOTHPASTE.   Questions were answered. Patient verbalized understanding of instructions.

## 2023-12-06 ENCOUNTER — Ambulatory Visit: Payer: Self-pay | Admitting: General Surgery

## 2023-12-07 ENCOUNTER — Other Ambulatory Visit: Payer: Self-pay

## 2023-12-07 ENCOUNTER — Encounter (HOSPITAL_COMMUNITY): Payer: Self-pay | Admitting: General Surgery

## 2023-12-07 ENCOUNTER — Ambulatory Visit (HOSPITAL_COMMUNITY)
Admission: RE | Admit: 2023-12-07 | Discharge: 2023-12-07 | Disposition: A | Discharge status: Home / Self Care | Source: Ambulatory Visit | Attending: General Surgery | Admitting: General Surgery

## 2023-12-07 ENCOUNTER — Ambulatory Visit (HOSPITAL_BASED_OUTPATIENT_CLINIC_OR_DEPARTMENT_OTHER): Admitting: Vascular Surgery

## 2023-12-07 ENCOUNTER — Encounter (HOSPITAL_COMMUNITY)
Admission: RE | Disposition: A | Discharge status: Home / Self Care | Payer: Self-pay | Source: Ambulatory Visit | Attending: General Surgery

## 2023-12-07 ENCOUNTER — Ambulatory Visit (HOSPITAL_COMMUNITY): Admitting: Vascular Surgery

## 2023-12-07 DIAGNOSIS — I739 Peripheral vascular disease, unspecified: Secondary | ICD-10-CM | POA: Insufficient documentation

## 2023-12-07 DIAGNOSIS — D6862 Lupus anticoagulant syndrome: Secondary | ICD-10-CM | POA: Insufficient documentation

## 2023-12-07 DIAGNOSIS — I252 Old myocardial infarction: Secondary | ICD-10-CM | POA: Insufficient documentation

## 2023-12-07 DIAGNOSIS — I110 Hypertensive heart disease with heart failure: Secondary | ICD-10-CM | POA: Insufficient documentation

## 2023-12-07 DIAGNOSIS — I2510 Atherosclerotic heart disease of native coronary artery without angina pectoris: Secondary | ICD-10-CM | POA: Diagnosis not present

## 2023-12-07 DIAGNOSIS — K8010 Calculus of gallbladder with chronic cholecystitis without obstruction: Secondary | ICD-10-CM | POA: Insufficient documentation

## 2023-12-07 DIAGNOSIS — I509 Heart failure, unspecified: Secondary | ICD-10-CM | POA: Insufficient documentation

## 2023-12-07 DIAGNOSIS — K819 Cholecystitis, unspecified: Secondary | ICD-10-CM | POA: Diagnosis not present

## 2023-12-07 DIAGNOSIS — Z79899 Other long term (current) drug therapy: Secondary | ICD-10-CM | POA: Insufficient documentation

## 2023-12-07 DIAGNOSIS — K810 Acute cholecystitis: Secondary | ICD-10-CM

## 2023-12-07 HISTORY — PX: CHOLECYSTECTOMY: SHX55

## 2023-12-07 LAB — BASIC METABOLIC PANEL WITH GFR
Anion gap: 12 (ref 5–15)
BUN: 16 mg/dL (ref 8–23)
CO2: 17 mmol/L — ABNORMAL LOW (ref 22–32)
Calcium: 9.9 mg/dL (ref 8.9–10.3)
Chloride: 106 mmol/L (ref 98–111)
Creatinine, Ser: 1.12 mg/dL — ABNORMAL HIGH (ref 0.44–1.00)
GFR, Estimated: 54 mL/min — ABNORMAL LOW (ref >60)
Glucose, Bld: 94 mg/dL (ref 70–99)
Potassium: 3.9 mmol/L (ref 3.5–5.1)
Sodium: 135 mmol/L (ref 135–145)

## 2023-12-07 LAB — CBC
HCT: 41.4 % (ref 36.0–46.0)
Hemoglobin: 13 g/dL (ref 12.0–15.0)
MCH: 28.5 pg (ref 26.0–34.0)
MCHC: 31.4 g/dL (ref 30.0–36.0)
MCV: 90.8 fL (ref 80.0–100.0)
Platelets: 492 10*3/uL — ABNORMAL HIGH (ref 150–400)
RBC: 4.56 MIL/uL (ref 3.87–5.11)
RDW: 12.8 % (ref 11.5–15.5)
WBC: 10.1 10*3/uL (ref 4.0–10.5)
nRBC: 0 % (ref 0.0–0.2)

## 2023-12-07 SURGERY — LAPAROSCOPIC CHOLECYSTECTOMY
Anesthesia: General | Site: Abdomen

## 2023-12-07 MED ORDER — ACETAMINOPHEN 500 MG PO TABS
1000.0000 mg | ORAL_TABLET | ORAL | Status: AC
  Administered 2023-12-07: 1000 mg via ORAL
  Filled 2023-12-07: qty 2

## 2023-12-07 MED ORDER — MIDAZOLAM HCL 2 MG/2ML IJ SOLN
INTRAMUSCULAR | Status: AC
  Filled 2023-12-07: qty 2

## 2023-12-07 MED ORDER — PHENYLEPHRINE 80 MCG/ML (10ML) SYRINGE FOR IV PUSH (FOR BLOOD PRESSURE SUPPORT)
PREFILLED_SYRINGE | INTRAVENOUS | Status: DC | PRN
  Administered 2023-12-07 (×2): 160 ug via INTRAVENOUS

## 2023-12-07 MED ORDER — SODIUM CHLORIDE 0.9% FLUSH: 3.0000 mL | INTRAVENOUS | Status: DC | PRN

## 2023-12-07 MED ORDER — SODIUM CHLORIDE 0.9% FLUSH
3.0000 mL | Freq: Two times a day (BID) | INTRAVENOUS | Status: DC
Start: 2023-12-07 — End: 2023-12-07

## 2023-12-07 MED ORDER — CHLORHEXIDINE GLUCONATE CLOTH 2 % EX PADS: 6.0000 | MEDICATED_PAD | Freq: Once | CUTANEOUS | Status: DC

## 2023-12-07 MED ORDER — FENTANYL CITRATE (PF) 100 MCG/2ML IJ SOLN
25.0000 ug | INTRAMUSCULAR | Status: DC | PRN
  Administered 2023-12-07 (×2): 50 ug via INTRAVENOUS

## 2023-12-07 MED ORDER — SUGAMMADEX SODIUM 200 MG/2ML IV SOLN
INTRAVENOUS | Status: DC | PRN
  Administered 2023-12-07: 200 mg via INTRAVENOUS

## 2023-12-07 MED ORDER — ONDANSETRON HCL 4 MG/2ML IJ SOLN: 4.0000 mg | Freq: Once | INTRAMUSCULAR | Status: DC | PRN

## 2023-12-07 MED ORDER — ONDANSETRON HCL 4 MG/2ML IJ SOLN
INTRAMUSCULAR | Status: DC | PRN
  Administered 2023-12-07: 4 mg via INTRAVENOUS

## 2023-12-07 MED ORDER — OXYCODONE HCL 5 MG PO TABS
5.0000 mg | ORAL_TABLET | ORAL | Status: DC | PRN
  Administered 2023-12-07: 10 mg via ORAL

## 2023-12-07 MED ORDER — HYDROMORPHONE HCL 1 MG/ML IJ SOLN
INTRAMUSCULAR | Status: DC | PRN
  Administered 2023-12-07: .5 mg via INTRAVENOUS

## 2023-12-07 MED ORDER — INDOCYANINE GREEN 25 MG IV SOLR
2.5000 mg | Freq: Once | INTRAVENOUS | Status: AC
  Administered 2023-12-07: 2.5 mg via INTRAVENOUS
  Filled 2023-12-07: qty 10

## 2023-12-07 MED ORDER — LACTATED RINGERS IV SOLN: INTRAVENOUS | Status: DC

## 2023-12-07 MED ORDER — CEFAZOLIN SODIUM-DEXTROSE 2-4 GM/100ML-% IV SOLN
2.0000 g | INTRAVENOUS | Status: DC
  Filled 2023-12-07: qty 100

## 2023-12-07 MED ORDER — HYDROMORPHONE HCL 1 MG/ML IJ SOLN
INTRAMUSCULAR | Status: AC
  Filled 2023-12-07: qty 1

## 2023-12-07 MED ORDER — OXYCODONE HCL 5 MG/5ML PO SOLN: 5.0000 mg | Freq: Once | ORAL | Status: DC | PRN

## 2023-12-07 MED ORDER — OXYCODONE HCL 5 MG PO TABS
ORAL_TABLET | ORAL | Status: AC
  Filled 2023-12-07: qty 2

## 2023-12-07 MED ORDER — GABAPENTIN 300 MG PO CAPS
300.0000 mg | ORAL_CAPSULE | ORAL | Status: AC
  Administered 2023-12-07: 300 mg via ORAL
  Filled 2023-12-07: qty 1

## 2023-12-07 MED ORDER — ROCURONIUM BROMIDE 10 MG/ML (PF) SYRINGE
PREFILLED_SYRINGE | INTRAVENOUS | Status: DC | PRN
  Administered 2023-12-07: 50 mg via INTRAVENOUS

## 2023-12-07 MED ORDER — PROPOFOL 10 MG/ML IV BOLUS
INTRAVENOUS | Status: AC
  Filled 2023-12-07: qty 20

## 2023-12-07 MED ORDER — AMISULPRIDE (ANTIEMETIC) 5 MG/2ML IV SOLN
10.0000 mg | Freq: Once | INTRAVENOUS | Status: AC | PRN
  Administered 2023-12-07: 10 mg via INTRAVENOUS

## 2023-12-07 MED ORDER — SODIUM CHLORIDE 0.9 % IR SOLN: Status: DC | PRN

## 2023-12-07 MED ORDER — ORAL CARE MOUTH RINSE: 15.0000 mL | Freq: Once | OROMUCOSAL | Status: AC

## 2023-12-07 MED ORDER — PROPOFOL 10 MG/ML IV BOLUS
INTRAVENOUS | Status: DC | PRN
Start: 2023-12-07 — End: 2023-12-07
  Administered 2023-12-07: 90 mg via INTRAVENOUS

## 2023-12-07 MED ORDER — ACETAMINOPHEN 325 MG PO TABS: 650.0000 mg | ORAL_TABLET | ORAL | Status: DC | PRN

## 2023-12-07 MED ORDER — IBUPROFEN 200 MG PO TABS: 600.0000 mg | ORAL_TABLET | Freq: Four times a day (QID) | ORAL | 0 refills | Status: AC

## 2023-12-07 MED ORDER — ACETAMINOPHEN 325 MG PO TABS: 650.0000 mg | ORAL_TABLET | Freq: Four times a day (QID) | ORAL | 0 refills | Status: AC

## 2023-12-07 MED ORDER — FENTANYL CITRATE (PF) 250 MCG/5ML IJ SOLN
INTRAMUSCULAR | Status: DC | PRN
Start: 2023-12-07 — End: 2023-12-07
  Administered 2023-12-07: 50 ug via INTRAVENOUS
  Administered 2023-12-07: 100 ug via INTRAVENOUS

## 2023-12-07 MED ORDER — CHLORHEXIDINE GLUCONATE 0.12 % MT SOLN
OROMUCOSAL | Status: AC
  Administered 2023-12-07: 15 mL via OROMUCOSAL
  Filled 2023-12-07: qty 15

## 2023-12-07 MED ORDER — ACETAMINOPHEN 650 MG RE SUPP: 650.0000 mg | RECTAL | Status: DC | PRN

## 2023-12-07 MED ORDER — AMISULPRIDE (ANTIEMETIC) 5 MG/2ML IV SOLN
INTRAVENOUS | Status: AC
  Filled 2023-12-07: qty 4

## 2023-12-07 MED ORDER — BUPIVACAINE-EPINEPHRINE (PF) 0.25% -1:200000 IJ SOLN
INTRAMUSCULAR | Status: AC
  Filled 2023-12-07: qty 30

## 2023-12-07 MED ORDER — FENTANYL CITRATE (PF) 100 MCG/2ML IJ SOLN
INTRAMUSCULAR | Status: AC
  Filled 2023-12-07: qty 2

## 2023-12-07 MED ORDER — OXYCODONE HCL 5 MG PO TABS: 5.0000 mg | ORAL_TABLET | Freq: Once | ORAL | Status: DC | PRN

## 2023-12-07 MED ORDER — MORPHINE SULFATE (PF) 2 MG/ML IV SOLN: 1.0000 mg | INTRAVENOUS | Status: DC | PRN

## 2023-12-07 MED ORDER — SODIUM CHLORIDE 0.9 % IV SOLN: 250.0000 mL | INTRAVENOUS | Status: DC | PRN

## 2023-12-07 MED ORDER — FENTANYL CITRATE (PF) 250 MCG/5ML IJ SOLN
INTRAMUSCULAR | Status: AC
Start: 2023-12-07 — End: ?
  Filled 2023-12-07: qty 5

## 2023-12-07 MED ORDER — CHLORHEXIDINE GLUCONATE 0.12 % MT SOLN: 15.0000 mL | Freq: Once | OROMUCOSAL | Status: AC

## 2023-12-07 MED ORDER — HYDROMORPHONE HCL 1 MG/ML IJ SOLN
INTRAMUSCULAR | Status: AC
  Filled 2023-12-07: qty 0.5

## 2023-12-07 MED ORDER — MIDAZOLAM HCL 2 MG/2ML IJ SOLN
INTRAMUSCULAR | Status: DC | PRN
  Administered 2023-12-07: 1 mg via INTRAVENOUS

## 2023-12-07 MED ORDER — 0.9 % SODIUM CHLORIDE (POUR BTL) OPTIME
TOPICAL | Status: DC | PRN
  Administered 2023-12-07: 1000 mL

## 2023-12-07 MED ORDER — OXYCODONE HCL 5 MG PO TABS: 5.0000 mg | ORAL_TABLET | Freq: Three times a day (TID) | ORAL | 0 refills | Status: AC | PRN

## 2023-12-07 MED ORDER — BUPIVACAINE-EPINEPHRINE (PF) 0.25% -1:200000 IJ SOLN
INTRAMUSCULAR | Status: DC | PRN
  Administered 2023-12-07: 30 mL

## 2023-12-07 MED ORDER — LIDOCAINE 2% (20 MG/ML) 5 ML SYRINGE
INTRAMUSCULAR | Status: DC | PRN
Start: 2023-12-07 — End: 2023-12-07
  Administered 2023-12-07: 2.5 mg via INTRAVENOUS

## 2023-12-07 SURGICAL SUPPLY — 37 items
BAG COUNTER SPONGE SURGICOUNT (BAG) ×1 IMPLANT
CANISTER SUCT 3000ML PPV (MISCELLANEOUS) ×1 IMPLANT
CHLORAPREP W/TINT 26 (MISCELLANEOUS) ×1 IMPLANT
CLIP APPLIE ROT 10 11.4 M/L (STAPLE) ×1 IMPLANT
CNTNR URN SCR LID CUP LEK RST (MISCELLANEOUS) IMPLANT
COVER SURGICAL LIGHT HANDLE (MISCELLANEOUS) ×1 IMPLANT
DERMABOND ADVANCED .7 DNX12 (GAUZE/BANDAGES/DRESSINGS) ×1 IMPLANT
ELECTRODE REM PT RTRN 9FT ADLT (ELECTROSURGICAL) ×1 IMPLANT
ENDOLOOP SUT PDS II 0 18 (SUTURE) IMPLANT
GLOVE BIO SURGEON STRL SZ7 (GLOVE) ×1 IMPLANT
GOWN STRL REUS W/ TWL LRG LVL3 (GOWN DISPOSABLE) ×2 IMPLANT
GOWN STRL REUS W/ TWL XL LVL3 (GOWN DISPOSABLE) ×1 IMPLANT
GRASPER SUT TROCAR 14GX15 (MISCELLANEOUS) ×1 IMPLANT
IRRIGATION SUCT STRKRFLW 2 WTP (MISCELLANEOUS) ×1 IMPLANT
KIT BASIN OR (CUSTOM PROCEDURE TRAY) ×1 IMPLANT
KIT IMAGING PINPOINTPAQ (MISCELLANEOUS) IMPLANT
KIT TURNOVER KIT B (KITS) ×1 IMPLANT
LHOOK LAP DISP 36CM (ELECTROSURGICAL) ×1 IMPLANT
NDL 22X1.5 STRL (OR ONLY) (MISCELLANEOUS) ×1 IMPLANT
NDL INSUFFLATION 14GA 120MM (NEEDLE) ×1 IMPLANT
NEEDLE 22X1.5 STRL (OR ONLY) (MISCELLANEOUS) ×1 IMPLANT
NEEDLE INSUFFLATION 14GA 120MM (NEEDLE) ×1 IMPLANT
NS IRRIG 1000ML POUR BTL (IV SOLUTION) ×1 IMPLANT
PAD ARMBOARD POSITIONER FOAM (MISCELLANEOUS) ×1 IMPLANT
PENCIL BUTTON HOLSTER BLD 10FT (ELECTRODE) ×1 IMPLANT
POUCH RETRIEVAL ECOSAC 10 (ENDOMECHANICALS) ×1 IMPLANT
SCISSORS LAP 5X35 DISP (ENDOMECHANICALS) ×1 IMPLANT
SET TUBE SMOKE EVAC HIGH FLOW (TUBING) ×1 IMPLANT
SLEEVE Z-THREAD 5X100MM (TROCAR) ×2 IMPLANT
SUT MNCRL AB 4-0 PS2 18 (SUTURE) ×1 IMPLANT
TOWEL GREEN STERILE (TOWEL DISPOSABLE) ×1 IMPLANT
TOWEL GREEN STERILE FF (TOWEL DISPOSABLE) ×1 IMPLANT
TRAY LAPAROSCOPIC MC (CUSTOM PROCEDURE TRAY) ×1 IMPLANT
TROCAR Z THREAD OPTICAL 12X100 (TROCAR) ×1 IMPLANT
TROCAR Z-THREAD OPTICAL 5X100M (TROCAR) ×1 IMPLANT
WARMER LAPAROSCOPE (MISCELLANEOUS) ×1 IMPLANT
WATER STERILE IRR 1000ML POUR (IV SOLUTION) ×1 IMPLANT

## 2023-12-07 NOTE — Anesthesia Postprocedure Evaluation (Signed)
 Anesthesia Post Note  Patient: [name]  Procedure(s) Performed: LAPAROSCOPIC CHOLECYSTECTOMY (Abdomen)     Patient location during evaluation: PACU Anesthesia Type: General Level of consciousness: awake and alert Pain management: pain level controlled Vital Signs Assessment: post-procedure vital signs reviewed and stable Respiratory status: spontaneous breathing, nonlabored ventilation and respiratory function stable Cardiovascular status: stable and blood pressure returned to baseline Anesthetic complications: no   No notable events documented.  Last Vitals:  Vitals:   12/07/23 1500 12/07/23 1506  BP: 128/61 127/64  Pulse: 84 85  Resp: 14 12  Temp:  (!) 36.4 C  SpO2: 95% 95%    Last Pain:  Vitals:   12/07/23 1449  TempSrc:   PainSc: 5                  Juventino Oppenheim

## 2023-12-07 NOTE — Transfer of Care (Signed)
 Immediate Anesthesia Transfer of Care Note  Patient: [name] [name]  Procedure(s) Performed: LAPAROSCOPIC CHOLECYSTECTOMY (Abdomen)  Patient Location: PACU  Anesthesia Type:General  Level of Consciousness: awake, alert , and oriented  Airway & Oxygen Therapy: Patient Spontanous Breathing and Patient connected to nasal cannula oxygen  Post-op Assessment: Report given to RN and Post -op Vital signs reviewed and stable  Post vital signs: Reviewed and stable  Last Vitals:  Vitals Value Taken Time  BP 115/55 12/07/23 1408  Temp    Pulse    Resp 17 12/07/23 1412  SpO2    Vitals shown include unfiled device data.  Last Pain:  Vitals:   12/07/23 1131  TempSrc:   PainSc: 0-No pain         Complications: No notable events documented.

## 2023-12-07 NOTE — H&P (Signed)
 [name] [name] 1955-09-16  [id number].    HPI:  67 y/o F with gallbladder disease who presents for elective cholecystectomy. She reports that she has continued to have RUQ pain and has altered her diet to help with this. She otherwise denies new changes in her medical history or medications. She is on Xarelto  and has held this for the past 2 days.   ROS: Review of Systems  Constitutional: Negative.   HENT: Negative.    Eyes: Negative.   Respiratory: Negative.    Cardiovascular: Negative.   Gastrointestinal:  Positive for abdominal pain.  Genitourinary: Negative.   Musculoskeletal: Negative.   Skin: Negative.   Neurological: Negative.   Endo/Heme/Allergies: Negative.   Psychiatric/Behavioral: Negative.      Family History  Problem Relation Age of Onset   Leukemia Mother    Prostate cancer Father    Cancer Father    Stroke Maternal Grandmother    Lung cancer Maternal Grandfather    Leukemia Paternal Grandmother    Cancer Paternal Grandfather    Spina bifida Son        Multiple surgeries   Colon cancer Neg Hx    Esophageal cancer Neg Hx     Past Medical History:  Diagnosis Date   Back pain    CAD S/P percutaneous coronary angioplasty 09/13/2018   09/13/2018 - Cath & Staged DES PCI on 09/14/2018: DES PCI: Cx99%&55% - Resolute Onyx DES 3.5 x 30 (3.6 mm), p-mLAD (prox of D1 almost to D2) - Resolute Onyx DES 3.0 x 22 (3.3 mm); DFR on pRCA 70% - 0.99, Not significant -> Rec Med Rx.   DVT (deep venous thrombosis) (HCC)    Heart attack (HCC) 09/09/2018   Hypertension    Lupus anticoagulant syndrome (HCC)    NSTEMI (non-ST elevated myocardial infarction) (HCC) 09/12/2018   Initial presentation with chest pain was on 09/09/2018, recurrent pain on 1/26 2020 with positive troponins.  Cath showed three-vessel CAD -> declined CABG, opted for two-vessel DES PCI (LAD and CX, negative DFR RCA)   Pulmonary embolism (HCC)     Past Surgical History:  Procedure Laterality Date    ABDOMINAL AORTOGRAM W/LOWER EXTREMITY N/A 06/02/2022   Procedure: ABDOMINAL AORTOGRAM W/LOWER EXTREMITY;  Surgeon: Avanell Leigh, MD;  Location: [facility] LAB;  Service: Cardiovascular;  Laterality: N/A;   ABDOMINAL AORTOGRAM W/LOWER EXTREMITY N/A 03/19/2023   Procedure: ABDOMINAL AORTOGRAM W/LOWER EXTREMITY;  Surgeon: Avanell Leigh, MD;  Location: [facility];  Service: Cardiovascular;  Laterality: N/A;   APPLICATION OF WOUND VAC Left 06/06/2022   Procedure: APPLICATION OF WOUND VAC LEFT GROIN;  Surgeon: Kayla Part, MD;  Location: [facility] OR;  Service: Vascular;  Laterality: Left;   ARTERY REPAIR Left 06/06/2022   Procedure: LEFT COMMON FEMORAL ARTERY REPAIR WITH MORCELLS;  Surgeon: Kayla Part, MD;  Location: [facility] OR;  Service: Vascular;  Laterality: Left;   BACK SURGERY     COLONOSCOPY  05/19/2012   Procedure: COLONOSCOPY;  Surgeon: Almeda Aris, MD;  Location: [facility] ENDOSCOPY;  Service: Endoscopy;  Laterality: N/A;   COLONOSCOPY WITH PROPOFOL  N/A 09/30/2018   Procedure: COLONOSCOPY WITH PROPOFOL ;  Surgeon: Ace Holder, MD;  Location: [facility] ENDOSCOPY;  Service: Gastroenterology;  Laterality: N/A;   CORONARY PRESSURE/FFR STUDY N/A 09/14/2018   Procedure: INTRAVASCULAR PRESSURE WIRE/FFR STUDY;  Surgeon: Swaziland, Peter M, MD;  Location: [facility] INVASIVE CV LAB;  Service: Cardiovascular;  Laterality: RCA: DFR on pRCA 70% - 0.99, Not significant ->  Rec Med Rx.   CORONARY STENT INTERVENTION N/A 09/14/2018   Procedure: CORONARY STENT INTERVENTION;  Surgeon: Swaziland, Peter M, MD;  Location: [facility] INVASIVE CV LAB;  Service: Cardiovascular: September 14, 2018- DES PCI: Cx99%&55% - Resolute Onyx DES 3.5 x 30 (3.6 mm), p-mLAD (prox of D1 almost to D2) - Resolute Onyx DES 3.0 x 22 (3.3 mm); DFR on pRCA 70% - 0.99, Not significant -> Rec Med Rx.   ESOPHAGOGASTRODUODENOSCOPY  05/19/2012   Procedure: ESOPHAGOGASTRODUODENOSCOPY (EGD);  Surgeon: Almeda Aris, MD;  Location: [facility] ENDOSCOPY;  Service:  Endoscopy;  Laterality: N/A;   ESOPHAGOGASTRODUODENOSCOPY (EGD) WITH PROPOFOL  N/A 09/30/2018   Procedure: ESOPHAGOGASTRODUODENOSCOPY (EGD) WITH PROPOFOL ;  Surgeon: Ace Holder, MD;  Location: [facility] ENDOSCOPY;  Service: Gastroenterology;  Laterality: N/A;   GIVENS CAPSULE STUDY N/A 09/30/2018   Procedure: GIVENS CAPSULE STUDY;  Surgeon: Ace Holder, MD;  Location: [facility] ENDOSCOPY;  Service: Gastroenterology;  Laterality: N/A;   HEMATOMA EVACUATION Left 06/06/2022   Procedure: EVACUATION HEMATOMA LEFT GROIN;  Surgeon: Kayla Part, MD;  Location: [facility] OR;  Service: Vascular;  Laterality: Left;   HOT HEMOSTASIS N/A 09/30/2018   Procedure: HOT HEMOSTASIS (ARGON PLASMA COAGULATION/BICAP);  Surgeon: Ace Holder, MD;  Location: [facility] ENDOSCOPY;  Service: Gastroenterology;  Laterality: N/A;   LEFT HEART CATH AND CORONARY ANGIOGRAPHY N/A 09/13/2018   Procedure: LEFT HEART CATH AND CORONARY ANGIOGRAPHY;  Surgeon: Arleen Lacer, MD;  Location: [facility] INVASIVE CV LAB;  Service: Cardiovascular:: CULPRIT LESION 99% p-mCx followed by 55%mCx; pLAD 35% A D31followed by long 80% lesion @ SP1); pRCA 70%. Normal LVEDP. Global HK EF ~45%.  -Decision on PCI deferred until discussion about PCI versus CABG.  Concern because of long-term DOAC.   PERIPHERAL VASCULAR ATHERECTOMY  06/02/2022   Procedure: PERIPHERAL VASCULAR ATHERECTOMY;  Surgeon: Avanell Leigh, MD;  Location: [facility] INVASIVE CV LAB;  Service: Cardiovascular;;   PERIPHERAL VASCULAR BALLOON ANGIOPLASTY  06/02/2022   Procedure: PERIPHERAL VASCULAR BALLOON ANGIOPLASTY;  Surgeon: Avanell Leigh, MD;  Location: [facility]VASIVE CV LAB;  Service: Cardiovascular;;   TRANSTHORACIC ECHOCARDIOGRAM  09/13/2018   Mildly reduced EF 45 to 50% with diffuse HK.  GR 1 DD.  Mild aortic valve calcification.  MAC.  Moderate pulmonic regurgitation.   WOUND EXPLORATION Left 06/06/2022   Procedure: LEFT GROIN EXPLORATION;  Surgeon: Kayla Part, MD;  Location: [facility] OR;   Service: Vascular;  Laterality: Left;    Social History:  reports that she has been smoking cigarettes. She started smoking about 30 years ago. She has a 14.5 pack-year smoking history. She has never used smokeless tobacco. She reports current alcohol use. She reports that she does not use drugs.  Allergies:  Allergies  Allergen Reactions   Prednisone Other (See Comments)    No energy "stalls out".    Makes pt  Jittery.  **makes pt have no energy**   Tramadol Other (See Comments) and Nausea Only    Constipation,  Makes pt  Jittery.  constipation   Crestor  [Rosuvastatin  Calcium ]     Feels fatigued, RE  CHALLENGE - UNABLE TO USE 12/28/18   Hctz [Hydrochlorothiazide ]     Dizzy    Repatha  [Evolocumab ] Other (See Comments)    Per patient never took this medication   Zithromax [Azithromycin] Other (See Comments)    jittery    Medications Prior to Admission  Medication Sig Dispense Refill   acetaminophen  (TYLENOL ) 650 MG CR tablet Take 650 mg by mouth daily as needed for pain.  amLODipine  (NORVASC ) 2.5 MG tablet TAKE 1 TABLET BY MOUTH TWICE A DAY 180 tablet 2   bisacodyl  (DULCOLAX) 5 MG EC tablet Take 5 mg by mouth daily as needed for moderate constipation.     carvedilol  (COREG ) 6.25 MG tablet TAKE 0.5 TABLETS (3.125 MG TOTAL) BY MOUTH 2 (TWO) TIMES DAILY WITH A MEAL. 90 tablet 2   fenofibrate  (TRICOR ) 145 MG tablet TAKE 1 TABLET (145 MG TOTAL) BY MOUTH DAILY. TAKE WITH A MEAL. 90 tablet 3   lisinopril  (ZESTRIL ) 40 MG tablet TAKE 1 TABLET BY MOUTH EVERY DAY 90 tablet 2   pravastatin  (PRAVACHOL ) 40 MG tablet Take 1 tablet (40 mg total) by mouth every evening. 90 tablet 3   XARELTO  20 MG TABS tablet TAKE 1 TABLET BY MOUTH DAILY WITH SUPPER. 90 tablet 3   ezetimibe  (ZETIA ) 10 MG tablet Take 1 tablet (10 mg total) by mouth daily. (Patient not taking: Reported on 12/01/2023) 90 tablet 3   nitroGLYCERIN  (NITROSTAT ) 0.4 MG SL tablet Place 1 tablet (0.4 mg total) under the tongue every 5  (five) minutes as needed for up to 3 doses for chest pain. 25 tablet 3    Physical Exam: Blood pressure 134/72, pulse (!) 101, temperature 97.6 F (36.4 C), temperature source Oral, resp. rate 18, height 5\' 4"  (1.626 m), weight 61.7 kg, SpO2 100%. Gen: female, NAD Abd: soft, non-distended  Results for orders placed or performed during the hospital encounter of 12/07/23 (from the past 48 hours)  CBC     Status: Abnormal   Collection Time: 12/07/23 11:30 AM  Result Value Ref Range   WBC 10.1 4.0 - 10.5 K/uL   RBC 4.56 3.87 - 5.11 MIL/uL   Hemoglobin 13.0 12.0 - 15.0 g/dL   HCT 16.1 09.6 - 04.5 %   MCV 90.8 80.0 - 100.0 fL   MCH 28.5 26.0 - 34.0 pg   MCHC 31.4 30.0 - 36.0 g/dL   RDW 40.9 81.1 - 91.4 %   Platelets 492 (H) 150 - 400 K/uL   nRBC 0.0 0.0 - 0.2 %    Comment: Performed at [facility], [address], [place], [place] [address]   No results found.  Assessment/Plan 68 y/o F who presents for cholecystectomy.  - Will proceed to the OR. We discussed the alternatives and potential risks of surgery, including but not limited to: bleeding, infection, damage to bowel or surrounding structures, bile leak, damage to the biliary system, pancreatitis, retained stone, and need for additional procedures. All questions were addressed and consent was obtained.    Trula Gable Surgery 12/07/2023, 12:24 PM Please see Amion for pager number during day hours 7:00am-4:30pm or 7:00am -11:30am on weekends

## 2023-12-07 NOTE — Discharge Instructions (Signed)
 Home Care After Gallbladder Removal  Activity  Limit activity for the first 24 hours, then you may return to normal daily activities. Returning to normal daily activities as soon as you can following surgery will enhance recovery time.  No heavy lifting pushing or pulling, anything heavier than 10 pounds (gallon of milk weighs approx. 8.8 pounds) for 2 weeks from surgery date.  Do not mow the lawn, use a vacuum cleaner, or do any other strenuous activities without first consulting your surgical team.  Climb stairs slowly and watch your step.  Walk as often as you feel able to increase strength and endurance.  No driving or operating heavy machinery within 24 hours of taking narcotic pain medication.  Diet  Drink plenty of fluids and eat a light meal on the night of surgery. Some patients may find their appetite is poor for a week or two after surgery. This is a normal result of the stress of surgery-your appetite will return in time.   There are no specific diet restrictions after surgery and you resume regular diet as tolerated. However, you may want to refrain from fatty, heavy, and/or greasy foods and follow a low fat diet for 2-4 weeks to avoid temporary diarrhea and nausea. Slowly add fats back into diet.   If you have diarrhea, try avoiding spicy foods, dairy products, fatty foods, and alcohol. You can also watch to see if specific foods cause it, and stop eating them. If the diarrhea continues for more than 2 weeks, talk to your doctor.  Dressings and Wound Care  Keep your wound or incision site clean and dry.  You may have different types of dressings covering your incisions depending on your operation and your surgeon: o Dermabond/Durabond (skin glue): This will usually remain in place for 10-14 days, then naturally fall off your skin. You may take a shower 24 hrs after surgery, carefully wash, not scrub the incision site with a mild non-scented soap. Pat dry with a soft towel.  Do not pick  or peel skin glue off.  You can shower and let the water fall on the dressings above. Do not soak or submerge your incision(s) in a bath tub, hot tub, or swimming pool, until your doctor says it is ok to do so or the incision(s) have completely healed, usually about 2-4 weeks.  Do not use creams, powder, salves or balms on your incision(s).  What to Expect After Surgery  Moderate discomfort controlled with medications  You may feel pain in one or both shoulders. This pain comes from the gas still left in your belly after the surgery, if you had laparoscopic surgery (several small incisions). The pain should ease over several days to a week. Ambulation will help with this pain.   Minimal drainage from incision  Belly swelling  Feeling fatigue and weak  Loose stools after eating. This may occur for 4-8 weeks; however longer in some cases.   Constipation after surgery is common. Drink plenty fluids and eat a high fiber diet.   Pain Control: Prescribed Non-Narcotic Pain Medication  You will be given three prescriptions.  Two of them will be for prescription strength ibuprofen (i.e. Advil) and prescription strength acetaminophen (i.e. Tylenol).  The vast majority of patients will just need these two medications.  One prescription will be for a 'rescue' prescription of an oral narcotic (oxycodone).  You may fill this if needed.  You will alternate taking the ibuprofen (600mg ) every 6 hours and also the acetaminophen (  650mg ) every 6 hours so that you are taking one of those medications every 3 hours.  For example: o 0800 - take ibuprofen 600mg  o 1100 - take acetaminophen 650mg  o 1400 - take ibuprofen 600mg  o 1700 - take acetaminophen 650mg  o Etc.  Continue taking this alternating pattern of ibuprofen and acetaminophen for 3 days  If you cannot take one or the other of these medications, just take the one you can every 6 hours.  If you are comfortable at night, you don't have to wake up and take a  medication.  If you are still uncomfortable after taking either ibuprofen or acetaminophen, try gentle stretching exercise and ice packs (a bag of frozen vegetables works great).  If you are still uncomfortable, you may fill the narcotic prescription of Oxycodone and take as directed.  Once you have completed these prescriptions, your pain level should be low enough to stop taking medications altogether or just use an over the counter medication (ibuprofen or acetaminophen) as needed.   Pain Control: Over the Counter Medications to take as needed  Colace/Docusate: May be prescribed by your surgeon to prevent constipation caused by the combination of narcotics, effects of anesthesia, and decreased ambulation.  Hold for loose stools or diarrhea. Take 100 mg 1-2 times a day starting tonight.   Fiber: High fiber foods, extra liquids (water 9-13 cups/day) can also assist with constipation. Examples of high fiber foods are fruit, bran. Prune juice and water are also good liquids to drink.  Milk of Magnesia/Miralax:  If constipated despite take the over the counter stool softeners, you may take Milk of Magnesia or Miralax as directed on bottle to assist with constipation.     Pepcid/Famotidine: May be prescribed while taking naproxen (Aleve) or other NSAIDs such as ibuprofen (Motrin/Advil) to prevent stomach upset or Acid-reflux symptoms. Take 1 tablet 1-2 times a day.   **Constipation: The first bowel movement may occur anywhere between 1-5 days after surgery.  As long as you are not nauseated or not having significant abdominal pain this variation is acceptable. Narcotic pain medications can cause constipation increasing discomfort; early discontinuation will assist with bowel management.If constipated despite taking stool softeners,  you may take Milk of Magnesia or Miralax as directed on the bottle.     **Home medications: You may restart your home medications as directed by your respective Primary Care  Physician or Surgeon.   When to notify your Doctor or Healthcare Team  Sign of Wound Infection   Fever over 100 degrees.  Wound becomes extremely swollen, shows red streaks, warm to the touch, and/or drainage from the incision site or foul-smelling drainage.  Wound edges separate or opens up  Bleeding or bruising   If you have bleeding, apply pressure to the site and hold the pressure firmly for 5 minutes. If the bleeding continues, apply pressure again and call 911. If the bleeding stopped, call your doctor to report it.   Call your doctor or nurse if you have increased bleeding from your site and increased bruising or a lump forms or gets larger under your skin at the site.  Unrelieved Pain    Call your doctor or nurse if your pain gets worse or is not eased 1 hour after taking your pain medicine, or if it is severe and uncontrolled.  Fever, Flu-like symptoms   Fever over 100 degrees and/or chills  Gastrointestinal Symptoms    If you have yellowing of your eyes or skin (jaundice)  If you  have dark or rust-colored urine  If your stool is gray colored   If you have nausea and vomiting that continues more than 24 hours, will not let you keep medicine down and will not let you keep fluids down  Black tarry bowel movements. This can be normal after surgery on the stomach, but should resolve in a day or two.    Call 911 if you suddenly have signs of blood loss such as:  Vomiting blood  Fast heart rate  Feeling faint, sweaty, or blacking out  Passing bright red blood from your rectum  Blood Clot Symptoms   Tender, swollen or reddened areas in your calf muscle or thighs.  Numbness or tingling in your lower leg or calf, or at the top of your leg or groin  Skin on your leg looks pale or blue or feels cold to touch  Chest pain or have trouble breathing, lightheadedness, fast heart rate  Sudden Onset of Symptoms    Call 911 if you suddenly have:  Leg weakness and spasm  Loss of bladder  or bowel function  Seizure  Confusion, severe headache, dizziness or feeling unsteady, problems talking, difficulty swallowing, and/or numbness or muscle weakness as these could be signs of a stroke.   Follow up Appointment Your follow up appointment should be scheduled 2-3 weeks after your surgery date.  If you have not previously scheduled for a follow-up visit you can be scheduled by contacting [phone]

## 2023-12-07 NOTE — Op Note (Signed)
 Patient: [name] [name] MRN: [id number] DOB: 1956/04/10 Sex: female Operation/Procedure Date: 12/07/2023 Surgeons and Role:    * Ogechi Kuehnel, Willeen Harold, MD - Primary  Pre-operative Diagnoses: CHOLECYSTITIS Postoperative Diagnoses: CHOLECYSTITIS  Procedure performed: Procedure:    LAPAROSCOPIC CHOLECYSTECTOMY CPT(R) Code:  13086 - PR LAPAROSCOPY SURG CHOLECYSTECTOMY  Anesthesia: General endotracheal anesthesia  Indications: [name] [name] is a 68 year old female who was evaluated in the office. Her workup was consistent with gallbladder disease, and I offered to take her for a cholecystectomy. Preoperatively, I discussed in detail the risks, benefits, alternatives, and potential complications. The patient understands and requests to proceed.  Operative Findings: Inflammation c/w cholecystitis.  Operative Narrative: The patient was positively identified and was taken to the operating room and placed supine on the operating table. A time-out was performed confirming correct patient and procedure. We also confirmed initiation of deep venous thrombosis prophylaxis and wound prophylaxis. After successful induction of general endotracheal anesthesia, the arms were carefully padded. An orogastric tube and footboard were placed. The abdomen was prepped and draped in the usual sterile surgical fashion.  We began our peritoneal access with a veress needle inserted at Palmer's point.  After aspiration showed return of air bubbles and there was a positive saline drop test, the insufflation was connected and the abdomen brought to a pressure of .  We then used an opti-view technique to place a 5mm port just to the right of midline, superior to the umbilicus.  A laparoscope was introduced into the abdomen, and there were no signs of injury from entry.  A 12mm port was placed in the subxiphoid position.  Two additional 5mm ports were placed in the RUQ.  A 360-degree visualization with a 30-degree 5-mm  laparoscope revealed grossly normal intra-abdominal contents.   The patient was placed in the head up position and tilted slightly to the left. The dome of the gallbladder was then grasped, elevated, and retracted anteriorly and cephalad.  The infundibulum was retracted laterally and inferiorly exposing Calot's triangle. The investing visceral peritoneal attachments overlying the infundibulum of the gallbladder were incised using the hook electrocautery and dissected free from the gallbladder itself. We soon developed two structures into the gallbladder consistent with the cystic duct and cystic artery. The loose areolar tissue around these structures was dissected free. The gallbladder was separated from the gallbladder fossa for approximately a third the distance up from the cystic plate, establishing the critical view of safety. We then transitioned to infrared viewing mode to allow for visualization of the ICG tracer. There was tracer throughout the liver. There was tracer within the candidate cystic duct as well as in a separate structure medially to the duct, consistent with where the common bile duct would be expected.  There was no tracer within the candidate cystic artery.  There was tracer within the duodenum, indicating no presence of a biliary obstruction. We transitioned back to normal viewing mode. The cystic duct and cystic artery were triply clipped. These were divided using laparoscopic scissors leaving a single clip on the removal side. The gallbladder was elevated off the gallbladder fossa using the hook Bovie. The gallbladder was then exteriorized through the subxiphoid port site using an EndoCatch bag. We reestablished pneumoperitoneum and confirmed no leakage of blood or bile. We confirmed integrity of our clips. The subhepatic space was irrigated with warm sterile saline and suctioned free. The 12mm port site was closed using an 0-vicryl on a suture passer.We placed 0.25% Marcaine  with  epinephrine   at each incision site for local anesthesia. The skin was closed using 4-0 Monocryl subcuticular suture. Dermabond was applied. The patient tolerated the procedure well, was extubated, and taken to the recovery room.  Estimated Blood Loss: Minimal  Specimens: Gallbladder Implants: None Drains: None Complications: None Condition of the patient: Good, extubated Disposition: PACU  Cannon Champion Date: 12/07/2023 Time: 1:54 PM

## 2023-12-07 NOTE — Anesthesia Procedure Notes (Signed)
 Procedure Name: Intubation Date/Time: 12/07/2023 1:14 PM  Performed by: Derral Flick, CRNAPre-anesthesia Checklist: Patient identified, Emergency Drugs available, Suction available and Patient being monitored Patient Re-evaluated:Patient Re-evaluated prior to induction Oxygen Delivery Method: Circle system utilized Preoxygenation: Pre-oxygenation with 100% oxygen Induction Type: IV induction Ventilation: Mask ventilation without difficulty Laryngoscope Size: Mac and 3 Grade View: Grade I Tube type: Oral Tube size: 6.5 mm Number of attempts: 1 Airway Equipment and Method: Stylet and Oral airway Placement Confirmation: ETT inserted through vocal cords under direct vision, positive ETCO2 and breath sounds checked- equal and bilateral Secured at: 21 cm Tube secured with: Tape Dental Injury: Teeth and Oropharynx as per pre-operative assessment

## 2023-12-08 ENCOUNTER — Encounter (HOSPITAL_COMMUNITY): Payer: Self-pay | Admitting: General Surgery

## 2023-12-09 LAB — SURGICAL PATHOLOGY

## 2023-12-14 NOTE — Telephone Encounter (Signed)
 Patient will need to be seen in clinic or by vascular for evaluation.  For worsening symptoms proceed to ED.

## 2023-12-31 ENCOUNTER — Encounter: Payer: Self-pay | Admitting: Family Medicine

## 2023-12-31 ENCOUNTER — Ambulatory Visit: Admitting: Family Medicine

## 2023-12-31 DIAGNOSIS — E2839 Other primary ovarian failure: Secondary | ICD-10-CM | POA: Diagnosis not present

## 2023-12-31 DIAGNOSIS — Z0000 Encounter for general adult medical examination without abnormal findings: Secondary | ICD-10-CM | POA: Diagnosis not present

## 2023-12-31 DIAGNOSIS — Z720 Tobacco use: Secondary | ICD-10-CM

## 2023-12-31 NOTE — Patient Instructions (Addendum)
 I really enjoyed getting to talk with you today! I am available on Tuesdays and Thursdays for virtual visits if you have any questions or concerns, or if I can be of any further assistance.   CHECKLIST FROM ANNUAL WELLNESS VISIT:  -Follow up (please call to schedule if not scheduled after visit):   -yearly for annual wellness visit with primary care office  Here is a list of your preventive care/health maintenance measures and the plan for each if any are due:  PLAN For any measures below that may be due:  can get the vaccine at the pharmacy, please let us  know if you do so that we can update your record  2. I sent referral for the lung cancer screeing program and for the bone density test. Pleas let us  know if you hav enot been contacted in the next few weeks about theses.    Health Maintenance  Topic Date Due   DTaP/Tdap/Td (1 - Tdap) Never done   Pneumonia Vaccine 63+ Years old (1 of 2 - PCV) Never done   Lung Cancer Screening  04/20/2013   DEXA SCAN  Never done   COVID-19 Vaccine (4 - 2024-25 season) 04/19/2023   Zoster Vaccines- Shingrix (1 of 2) 04/01/2024 (Originally 05/24/1975)   INFLUENZA VACCINE  03/18/2024   Medicare Annual Wellness (AWV)  12/30/2024   MAMMOGRAM  06/18/2025   Colonoscopy  09/30/2028   Hepatitis C Screening  Completed   HPV VACCINES  Aged Out   Meningococcal B Vaccine  Aged Out    -See a dentist at least yearly  -Get your eyes checked and then per your eye specialist's recommendations  -Other issues addressed today:   -I have included below further information regarding a healthy whole foods based diet, physical activity guidelines for adults, stress management and opportunities for social connections. I hope you find this information useful.    -----------------------------------------------------------------------------------------------------------------------------------------------------------------------------------------------------------------------------------------------------------    NUTRITION: -eat real food: lots of colorful vegetables (half the plate) and fruits -5-7 servings of vegetables and fruits per day (fresh or steamed is best), exp. 2 servings of vegetables with lunch and dinner and 2 servings of fruit per day. Berries and greens such as kale and collards are great choices.  -consume on a regular basis:  fresh fruits, fresh veggies, fish, nuts, seeds, healthy oils (such as olive oil, avocado oil), whole grains (make sure for bread/pasta/crackers/etc., that the first ingredient on label contains the word "whole"), legumes. -can eat small amounts of dairy and lean meat (no larger than the palm of your hand), but avoid processed meats such as ham, bacon, lunch meat, etc. -drink water -try to avoid fast food and pre-packaged foods, processed meat, ultra processed foods/beverages (donuts, candy, etc.) -most experts advise limiting sodium to < 2300mg  per day, should limit further is any chronic conditions such as high blood pressure, heart disease, diabetes, etc. The American Heart Association advised that < 1500mg  is is ideal -try to avoid foods/beverages that contain any ingredients with names you do not recognize  -try to avoid foods/beverages  with added sugar or sweeteners/sweets  -try to avoid sweet drinks (including diet drinks): soda, juice, Gatorade, sweet tea, power drinks, diet drinks -try to avoid white rice, white bread, pasta (unless whole grain)  EXERCISE GUIDELINES FOR ADULTS: -if you wish to increase your physical activity, do so gradually and with the approval of your doctor -STOP and seek medical care immediately if you have any chest pain, chest discomfort or trouble breathing when starting  or  increasing exercise  -move and stretch your body, legs, feet and arms when sitting for long periods -Physical activity guidelines for optimal health in adults: -get at least 150 minutes per week of moderate exercise (can talk, but not sing); this is about 20-30 minutes of sustained activity 5-7 days per week or two 10-15 minute episodes of sustained activity 5-7 days per week -do some muscle building/resistance training/strength training at least 2 days per week  -balance exercises 3+ days per week:   Stand somewhere where you have something sturdy to hold onto if you lose balance    1) lift up on toes, then back down, start with 5x per day and work up to 20x   2) stand and lift one leg straight out to the side so that foot is a few inches of the floor, start with 5x each side and work up to 20x each side   3) stand on one foot, start with 5 seconds each side and work up to 20 seconds on each side  If you need ideas or help with getting more active:  -Silver sneakers https://tools.silversneakers.com  -Walk with a Doc: http://www.duncan-[email or web address]/  -try to include resistance (weight lifting/strength building) and balance exercises twice per week: or the following link for ideas: http://castillo-powell.com/  BuyDucts.dk  STRESS MANAGEMENT: -can try meditating, or just sitting quietly with deep breathing while intentionally relaxing all parts of your body for 5 minutes daily -if you need further help with stress, anxiety or depression please follow up with your primary doctor or contact the wonderful folks at [facility]: [phone]  SOCIAL CONNECTIONS: -options in [place] if you wish to engage in more social and exercise related activities:  -Silver sneakers https://tools.silversneakers.com  -Walk with a Doc: http://www.duncan-[email or web address]/  -Check out the [facility] Active Adults 50+  section on the [place] of [employer] (hiking clubs, book clubs, cards and games, chess, exercise classes, aquatic classes and much more) - see the website for details: https://www[place].gov/departments/parks-recreation/active-adults50  -YouTube has lots of exercise videos for different ages and abilities as well  -[facility] (a variety of indoor and outdoor inperson activities for adults). [phone]. [address]  -Virtual Online Classes (a variety of topics): see seniorplanet.org or call [phone])[phone]  -consider volunteering at a school, hospice center, church, senior center or elsewhere  ADVANCED HEALTHCARE DIRECTIVES:   Advanced Directives assistance:   ExpressWeek.com.cy  Everyone should have advanced health care directives in place. This is so that you get the care you want, should you ever be in a situation where you are unable to make your own medical decisions.   From the [place] Advanced Directive Website: "Advance Health Care Directives are legal documents in which you give written instructions about your health care if, in the future, you cannot speak for yourself.   A health care power of attorney allows you to name a person you trust to make your health care decisions if you cannot make them yourself. A declaration of a desire for a natural death (or living will) is document, which states that you desire not to have your life prolonged by extraordinary measures if you have a terminal or incurable illness or if you are in a vegetative state. An advance instruction for mental health treatment makes a declaration of instructions, information and preferences regarding your mental health treatment. It also states that you are aware that the advance instruction authorizes a mental health treatment provider to act according to your wishes. It may  also outline your consent or refusal of mental  health treatment. A declaration of an anatomical gift allows anyone over the age of 53 to make a gift by will, organ donor card or other document."   Please see the following website or an elder law attorney for forms, FAQs and for completion of advanced directives: [employer]  Print production planner Health Care Directives Advance Health Care Directives ([email or web address]/)  Or copy and paste the following to your web browser: [email or web address].fi

## 2023-12-31 NOTE — Progress Notes (Signed)
 PATIENT CHECK-IN and HEALTH RISK ASSESSMENT QUESTIONNAIRE:  -completed by phone/video for upcoming Medicare Preventive Visit  Pre-Visit Check-in: 1)Vitals (height, wt, BP, etc) - record in vitals section for visit on day of visit Request home vitals (wt, BP, etc.) and enter into vitals, THEN update Vital Signs SmartPhrase below at the top of the HPI. See below.  2)Review and Update Medications, Allergies PMH, Surgeries, Social history in Epic 3)Hospitalizations in the last year with date/reason? No, had day surgery for GB surgery  4)Review and Update Care Team (patient's specialists) in Epic 5) Complete PHQ9 in Epic  6) Complete Fall Screening in Epic 7)Review all Health Maintenance Due and order under PCP if not done.  Medicare Wellness Patient Questionnaire:  Answer theses question about your habits: How often do you have a drink containing alcohol? Rare once a month How many drinks containing alcohol do you have on a typical day when you are drinking? 1 How often do you have six or more drinks on one occasion? never Have you ever smoked? Current smoker - 15 cigs per day How many packs a day do/did you smoke? 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 Y, one partner, no concerns Changed die to veggies, fruits, whole grains, beans, nuts, fish, some dairy - has been avoiding fried foods, processed grains, chips and ultra-processed foods. She has lost weight from this.   Beverages: water, hot tea, coffee   Answer theses question about your everyday activities: Can you perform most household chores?y Are you deaf or have significant trouble hearing?n Do you feel that you have a problem with memory?n Do you feel safe at home?y Last dentist  visit? Twice a year 8. Do you have any difficulty performing your everyday activities?n Are you having any difficulty walking, taking medications on your own, and or difficulty managing daily home needs?n Do you have difficulty walking or climbing stairs?n Do you have difficulty dressing or bathing?n Do you have difficulty doing errands alone such as visiting a doctor's office or shopping?n Do you currently have any difficulty preparing food and eating?n Do you currently have any difficulty using the toilet?n Do you have any difficulty managing your finances?n Do you have any difficulties with housekeeping of managing your housekeeping?n   Do you have Advanced Directives in place (Living Will, Healthcare Power or Attorney)? no   Last eye Exam and location? Goes on regular basis   Do you currently use prescribed or non-prescribed narcotic or opioid pain medications?no  Do you have a history or close family history of breast, ovarian, tubal or peritoneal cancer or a family member with BRCA (breast cancer susceptibility 1 and 2) gene mutations?      ----------------------------------------------------------------------------------------------------------------------------------------------------------------------------------------------------------------------  Because this visit was a virtual/telehealth visit, some criteria may be missing or patient reported. Any vitals not documented were not able to be obtained and vitals that have been documented are patient reported.    MEDICARE ANNUAL PREVENTIVE VISIT WITH PROVIDER: (Welcome to Medicare, initial annual wellness or annual wellness exam)  Virtual Visit via Video Note  I connected with [name] [name] on 12/31/23 by  a video enabled telemedicine application and verified that I am speaking with the correct person using two identifiers.  Location patient: home Location  provider:work or home office Persons participating in the  virtual visit: patient, provider  Concerns and/or follow up today: recently had goal bladder surgery. Reports went really well and recovered really well. Seeing Dr. Katheryne Pane tomorrow.    See HM section in Epic for other details of completed HM.    ROS: negative for report of fevers, unintentional weight loss, vision changes, vision loss, hearing loss or change, chest pain, sob, hemoptysis, melena, hematochezia, hematuria, falls, bleeding or bruising, thoughts of suicide or self harm, memory loss  Patient-completed extensive health risk assessment - reviewed and discussed with the patient: See Health Risk Assessment completed with patient prior to the visit either above or in recent phone note. This was reviewed in detailed with the patient today and appropriate recommendations, orders and referrals were placed as needed per Summary below and patient instructions.   Review of Medical History: -PMH, PSH, Family History and current specialty and care providers reviewed and updated and listed below   Patient Care Team: Viola Greulich, MD as PCP - General (Family Medicine) Arleen Lacer, MD as PCP - Cardiology (Cardiology) Arleen Lacer, MD as Consulting Physician (Cardiology) Ervin Heath, [place] as Physician Assistant (Cardiology)   Past Medical History:  Diagnosis Date   Back pain    CAD S/P percutaneous coronary angioplasty 09/13/2018   09/13/2018 - Cath & Staged DES PCI on 09/14/2018: DES PCI: Cx99%&55% - Resolute Onyx DES 3.5 x 30 (3.6 mm), p-mLAD (prox of D1 almost to D2) - Resolute Onyx DES 3.0 x 22 (3.3 mm); DFR on pRCA 70% - 0.99, Not significant -> Rec Med Rx.   DVT (deep venous thrombosis) (HCC)    Heart attack (HCC) 09/09/2018   Hypertension    Lupus anticoagulant syndrome (HCC)    NSTEMI (non-ST elevated myocardial infarction) (HCC) 09/12/2018   Initial presentation with chest pain was on 09/09/2018, recurrent pain on 1/26 2020 with positive troponins.  Cath showed three-vessel  CAD -> declined CABG, opted for two-vessel DES PCI (LAD and CX, negative DFR RCA)   Pulmonary embolism (HCC)     Past Surgical History:  Procedure Laterality Date   ABDOMINAL AORTOGRAM W/LOWER EXTREMITY N/A 06/02/2022   Procedure: ABDOMINAL AORTOGRAM W/LOWER EXTREMITY;  Surgeon: Avanell Leigh, MD;  [facility]: [facility] INVAS[facility] CV LAB;  Service: Cardiovascular;  Laterality: N/A;   ABDOMINAL AORTOGRAM W/LOWER EXTREMITY N/A 03/19/2023   Procedure: ABDOMINAL AORTOGRAM W/LOWER EXTREMITY;  Surgeon: Avanell Leigh, MD;  Location: [facility];  Service: Cardiovascular;  Laterality: N/A;   APPLICATION OF WOUND VAC Left 06/06/2022   Procedure: APPLICATION OF WOUND VAC LEFT GROIN;  Surgeon: Kayla Part, MD;  Location: [facility] OR;  Service: Vascular;  Laterality: Left;   ARTERY REPAIR Left 06/06/2022   Procedure: LEFT COMMON FEMORAL ARTERY REPAIR WITH MORCELLS;  Surgeon: Kayla Part, MD;  Location: [facility] OR;  Service: Vascular;  Laterality: Left;   BACK SURGERY     CHOLECYSTECTOMY N/A 12/07/2023   Procedure: LAPAROSCOPIC CHOLECYSTECTOMY;  Surgeon: Cannon Champion, MD;  Location: [facility] OR;  Service: General;  Laterality: N/A;   COLONOSCOPY  05/19/2012   Procedure: COLONOSCOPY;  Surgeon: Almeda Aris, MD;  Location: [facility] ENDOSCOPY;  Service: Endoscopy;  Laterality: N/A;   COLONOSCOPY WITH PROPOFOL  N/A 09/30/2018   Procedure: COLONOSCOPY WITH PROPOFOL ;  Surgeon: Ace Holder, MD;  Location: [facility] ENDOSCOPY;  Service: Gastroenterology;  Laterality: N/A;   CORONARY PRESSURE/FFR STUDY N/A 09/14/2018   Procedure: INTRAVASCULAR PRESSURE WIRE/FFR STUDY;  Surgeon: [place],  Hubert Madden, MD;  [facility]: [facility];  Service: Cardiovascular;  Laterality: RCA: DFR on pRCA 70% - 0.99, Not significant -> Rec Med Rx.   CORONARY STENT INTERVENTION N/A 09/14/2018   Procedure: CORONARY STENT INTERVENTION;  Surgeon: Swazil[place], Peter M, MD;  Location: [facility] INVASIVE CV LAB;  Service: Cardiovascular: September 14, 2018- DES  PCI: Cx99%&55% - Resolute Onyx DES 3.5 x 30 (3.6 mm), p-mLAD (prox of D1 almost to D2) - Resolute Onyx DES 3.0 x 22 (3.3 mm); DFR on pRCA 70% - 0.99, Not significant -> Rec Med Rx.   ESOPHAGOGASTRODUODENOSCOPY  05/19/2012   Procedure: ESOPHAGOGASTRODUODENOSCOPY (EGD);  Surgeon: Almeda Aris, MD;  Location: [facility] ENDOSCOPY;  Service: Endoscopy;  Laterality: N/A;   ESOPHAGOGASTRODUODENOSCOPY (EGD) WITH PROPOFOL  N/A 09/30/2018   Procedure: ESOPHAGOGASTRODUODENOSCOPY (EGD) WITH PROPOFOL ;  Surgeon: Ace Holder, MD;  Location: [facility] ENDOSCOPY;  Service: Gastroenterology;  Laterality: N/A;   GIVENS CAPSULE STUDY N/A 09/30/2018   Procedure: GIVENS CAPSULE STUDY;  Surgeon: Ace Holder, MD;  Location: [facility] ENDOSCOPY;  Service: Gastroenterology;  Laterality: N/A;   HEMATOMA EVACUATION Left 06/06/2022   Procedure: EVACUATION HEMATOMA LEFT GROIN;  Surgeon: Kayla Part, MD;  Location: [facility] OR;  Service: Vascular;  Laterality: Left;   HOT HEMOSTASIS N/A 09/30/2018   Procedure: HOT HEMOSTASIS (ARGON PLASMA COAGULATION/BICAP);  Surgeon: Ace Holder, MD;  Location: [facility] ENDOSCOPY;  Service: Gastroenterology;  Laterality: N/A;   LEFT HEART CATH AND CORONARY ANGIOGRAPHY N/A 09/13/2018   Procedure: LEFT HEART CATH AND CORONARY ANGIOGRAPHY;  Surgeon: Arleen Lacer, MD;  Location: [facility] INVASIVE CV LAB;  Service: Cardiovascular:: CULPRIT LESION 99% p-mCx followed by 55%mCx; pLAD 35% A D66followed by long 80% lesion @ SP1); pRCA 70%. Normal LVEDP. Global HK EF ~45%.  -Decision on PCI deferred until discussion about PCI versus CABG.  Concern because of long-term DOAC.   PERIPHERAL VASCULAR ATHERECTOMY  06/02/2022   Procedure: PERIPHERAL VASCULAR ATHERECTOMY;  Surgeon: Avanell Leigh, MD;  Location: [facility] INVASIVE CV LAB;  Service: Cardiovascular;;   PERIPHERAL VASCULAR BALLOON ANGIOPLASTY  06/02/2022   Procedure: PERIPHERAL VASCULAR BALLOON ANGIOPLASTY;  Surgeon: Avanell Leigh, MD;  [facility] INVAS[facility]  CV LAB;  Service: Cardiovascular;;   TRANSTHORACIC ECHOCARDIOGRAM  09/13/2018   Mildly reduced EF 45 to 50% with diffuse HK.  GR 1 DD.  Mild aortic valve calcification.  MAC.  Moderate pulmonic regurgitation.   WOUND EXPLORATION Left 06/06/2022   Procedure: LEFT GROIN EXPLORATION;  Surgeon: Kayla Part, MD;  Location: [facility] OR;  Service: Vascular;  Laterality: Left;    Social History   Socioeconomic History   Marital status: Married    Spouse name: Not on file   Number of children: 2   Years of education: Not on file   Highest education level: Bachelor's degree (e.g., BA, AB, BS)  Occupational History   Occupation: SERVICE MANAGER    Employer: [employer]  Tobacco Use   Smoking status: Every Day    Current packs/day: 0.50    Average packs/day: 0.5 packs/day for 50.4 years (25.2 ttl pk-yrs)    Types: Cigarettes    Start date: 1975   Smokeless tobacco: Never   Tobacco comments:    Cut back to quarter pack a day  Vaping Use   Vaping status: Never Used  Substance and Sexual Activity   Alcohol use: Yes    Comment: Occasional   Drug use: No   Sexual activity: Not on file  Other Topics Concern  Not on file  Social History Narrative   Really is a married mother of 2.  1 son died from complications of spina bifida after multiple surgeries.   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, [place].     She has a has a Scientist, water quality in education.     Drinks social alcohol & denies drug use.     Pt endorses smoking maybe 2 cigarettes/day.  States she placed most of the cigarette, may have 2 puffs.   Social Drivers of Corporate investment banker Strain: Low Risk  (12/28/2023)   Overall Financial Resource Strain (CARDIA)    Difficulty of Paying Living Expenses: Not hard at all  Food Insecurity: No Food Insecurity (12/28/2023)   Hunger Vital Sign    Worried About Running Out of Food in the Last Year: Never true    Ran Out of Food in the Last Year: Never true   Transportation Needs: No Transportation Needs (12/28/2023)   PRAPARE - Administrator, Civil Service (Medical): No    Lack of Transportation (Non-Medical): No  Physical Activity: Insufficiently Active (12/28/2023)   Exercise Vital Sign    Days of Exercise per Week: 2 days    Minutes of Exercise per Session: 20 min  Stress: No Stress Concern Present (12/28/2023)   [employer] of Occupational Health - Occupational Stress Questionnaire    Feeling of Stress : Not at all  Social Connections: Socially Integrated (12/28/2023)   Social Connection and Isolation Panel [NHANES]    Frequency of Communication with Friends and Family: More than three times a week    Frequency of Social Gatherings with Friends and Family: Three times a week    Attends Religious Services: More than 4 times per year    Active Member of Clubs or Organizations: Yes    Attends Banker Meetings: 1 to 4 times per year    Marital Status: Married  Catering manager Violence: Not At Risk (10/27/2022)   Humiliation, Afraid, Rape, and Kick questionnaire    Fear of Current or Ex-Partner: No    Emotionally Abused: No    Physically Abused: No    Sexually Abused: No    Family History  Problem Relation Age of Onset   Leukemia Mother    Prostate cancer Father    Cancer Father    Stroke Maternal Grandmother    Lung cancer Maternal Grandfather    Leukemia Paternal Grandmother    Cancer Paternal Grandfather    Spina bifida Son        Multiple surgeries   Colon cancer Neg Hx    Esophageal cancer Neg Hx     Current Outpatient Medications on File Prior to Visit  Medication Sig Dispense Refill   amLODipine  (NORVASC ) 2.5 MG tablet TAKE 1 TABLET BY MOUTH TWICE A DAY 180 tablet 2   bisacodyl  (DULCOLAX) 5 MG EC tablet Take 5 mg by mouth daily as needed for moderate constipation.     carvedilol  (COREG ) 6.25 MG tablet TAKE 0.5 TABLETS (3.125 MG TOTAL) BY MOUTH 2 (TWO) TIMES DAILY WITH A MEAL. 90 tablet 2    ezetimibe  (ZETIA ) 10 MG tablet Take 1 tablet (10 mg total) by mouth daily. (Patient not taking: Reported on 12/01/2023) 90 tablet 3   fenofibrate  (TRICOR ) 145 MG tablet TAKE 1 TABLET (145 MG TOTAL) BY MOUTH DAILY. TAKE WITH A MEAL. 90 tablet 3   lisinopril  (ZESTRIL ) 40 MG tablet TAKE 1 TABLET  BY MOUTH EVERY DAY 90 tablet 2   nitroGLYCERIN  (NITROSTAT ) 0.4 MG SL tablet Place 1 tablet (0.4 mg total) under the tongue every 5 (five) minutes as needed for up to 3 doses for chest pain. 25 tablet 3   pravastatin  (PRAVACHOL ) 40 MG tablet Take 1 tablet (40 mg total) by mouth every evening. 90 tablet 3   XARELTO  20 MG TABS tablet TAKE 1 TABLET BY MOUTH DAILY WITH SUPPER. 90 tablet 3   No current facility-administered medications on file prior to visit.    Allergies  Allergen Reactions   Prednisone Other (See Comments)    No energy "stalls out".    Makes pt  Jittery.  **makes pt have no energy**   Tramadol Other (See Comments) and Nausea Only    Constipation,  Makes pt  Jittery.  constipation   Crestor  [Rosuvastatin  Calcium ]     Feels fatigued, RE  CHALLENGE - UNABLE TO USE 12/28/18   Hctz [Hydrochlorothiazide ]     Dizzy    Repatha  [Evolocumab ] Other (See Comments)    Per patient never took this medication   Zithromax [Azithromycin] Other (See Comments)    jittery       Physical Exam Vitals requested from patient and listed below if patient had equipment and was able to obtain at home for this virtual visit: There were no vitals filed for this visit. Estimated body mass index is 23.34 kg/m as calculated from the following:   Height as of 12/07/23: 5\' 4"  (1.626 m).   Weight as of 12/07/23: 136 lb (61.7 kg).  EKG (optional): deferred due to virtual visit  GENERAL: alert, oriented, no acute distress detected, full vision exam deferred due to pandemic and/or virtual encounter  HEENT: atraumatic, conjunttiva clear, no obvious abnormalities on inspection of external nose and ears  NECK:  normal movements of the head and neck  LUNGS: on inspection no signs of respiratory distress, breathing rate appears normal, no obvious gross SOB, gasping or wheezing  CV: no obvious cyanosis  MS: moves all visible extremities without noticeable abnormality  PSYCH/NEURO: pleasant and cooperative, no obvious depression or anxiety, speech and thought processing grossly intact, Cognitive function grossly intact  Flowsheet Row Office Visit [facility]/16/20[facility]  PHQ-9 Total Score 0           12/31/2023   10:15 AM 06/01/2023    5:47 PM 03/16/2023    3:48 PM 10/27/2022    9:43 AM 10/31/2021    8:33 AM  Depression screen PHQ 2/9  Decreased Interest 0 0 0 0 0  Down, Depressed, Hopeless 0 0 0 0 0  PHQ - 2 Score 0 0 0 0 0  Altered sleeping   0    Tired, decreased energy   0    Change in appetite   0    Feeling bad or failure about yourself    0    Trouble concentrating   0    Moving slowly or fidgety/restless   0    Suicidal thoughts   0    PHQ-9 Score   0         10/27/2022    9:46 AM 03/16/2023    3:47 PM 06/01/2023   12:24 PM 12/28/2023    6:30 PM 12/31/2023   10:15 AM  Fall Risk  Falls in the past year? 0 0 0 0 0  Was there an injury with Fall? 0 0  0 0  Fall Risk  Category Calculator 0 0  0  0  Patient at Risk for Falls Due to No Fall Risks No Fall Risks     Fall risk Follow up Falls prevention discussed Falls evaluation completed   Falls evaluation completed;Education provided     Patient-reported     SUMMARY AND PLAN:  Encounter for Medicare annual wellness exam  Tobacco use - Plan: Ambulatory Referral Lung Cancer Screening [facility]  Estrogen deficiency - Plan: DG Bone Density    Discussed applicable health maintenance/preventive health measures and advised and referred or ordered per patient preferences: -sent referral for lung cancer screening and bone density -discussed vaccines risks/recs, she is considering  several and plans to get at the pharmacy Health Maintenance  Topic Date Due   DTaP/Tdap/Td (1 - Tdap) Never done   Pneumonia Vaccine 32+ Years old (1 of 2 - PCV) Never done   Lung Cancer Screening  04/20/2013   DEXA SCAN  Never done   COVID-19 Vaccine (4 - 2024-25 season) 04/19/2023   Zoster Vaccines- Shingrix (1 of 2) 04/01/2024 (Originally 05/24/1975)   INFLUENZA VACCINE  03/18/2024   Medicare Annual Wellness (AWV)  12/30/2024   MAMMOGRAM  06/18/2025   Colonoscopy  09/30/2028   Hepatitis C Screening  Completed   HPV VACCINES  Aged Out   Meningococcal B Vaccine  Aged Out      Education and counseling on the following was provided based on the above review of health and a plan/checklist for the patient, along with additional information discussed, was provided for the patient in the patient instructions :  -Advised on importance of completing advanced directives, discussed options for completing and provided information in patient instructions as well -Advised and counseled on a healthy lifestyle - including the importance of a healthy diet, regular physical activity, social connections  -Reviewed patient's current diet. Advised and counseled on a whole foods based healthy diet. A summary of a healthy diet was provided in the Patient Instructions.  -reviewed patient's current physical activity level and discussed exercise guidelines for adults. Discussed community resources and ideas for safe exercise at home to assist in meeting exercise guideline recommendations in a safe and healthy way.  -Advise yearly dental visits at minimum and regular eye exams -Advised and counseled on alcohol safe limits, risks/ tobacco use, risks of smoking and offered counseling/help - she is in the pre-contemplation stage.   Follow up: see patient instructions     Patient Instructions  I really enjoyed getting to talk with you today! I am available on Tuesdays and Thursdays for virtual visits if you have  any questions or concerns, or if I can be of any further assistance.   CHECKLIST FROM ANNUAL WELLNESS VISIT:  -Follow up (please call to schedule if not scheduled after visit):   -yearly for annual wellness visit with primary care office  Here is a list of your preventive care/health maintenance measures and the plan for each if any are due:  PLAN For any measures below that may be due:  can get the vaccine at the pharmacy, please let us  know if you do so that we can update your record  2. I sent referral for the lung cancer screeing program and for the bone density test. Pleas let us  know if you hav enot been contacted in the next few weeks about theses.    Health Maintenance  Topic Date Due   DTaP/Tdap/Td (1 - Tdap) Never done   Pneumonia Vaccine 30+ Years old (1  of 2 - PCV) Never done   Lung Cancer Screening  04/20/2013   DEXA SCAN  Never done   COVID-19 Vaccine (4 - 2024-25 season) 04/19/2023   Zoster Vaccines- Shingrix (1 of 2) 04/01/2024 (Originally 05/24/1975)   INFLUENZA VACCINE  03/18/2024   Medicare Annual Wellness (AWV)  12/30/2024   MAMMOGRAM  06/18/2025   Colonoscopy  09/30/2028   Hepatitis C Screening  Completed   HPV VACCINES  Aged Out   Meningococcal B Vaccine  Aged Out    -See a dentist at least yearly  -Get your eyes checked and then per your eye specialist's recommendations  -Other issues addressed today:   -I have included below further information regarding a healthy whole foods based diet, physical activity guidelines for adults, stress management and opportunities for social connections. I hope you find this information useful.   -----------------------------------------------------------------------------------------------------------------------------------------------------------------------------------------------------------------------------------------------------------    NUTRITION: -eat real food: lots of colorful vegetables (half the  plate) and fruits -5-7 servings of vegetables and fruits per day (fresh or steamed is best), exp. 2 servings of vegetables with lunch and dinner and 2 servings of fruit per day. Berries and greens such as kale and collards are great choices.  -consume on a regular basis:  fresh fruits, fresh veggies, fish, nuts, seeds, healthy oils (such as olive oil, avocado oil), whole grains (make sure for bread/pasta/crackers/etc., that the first ingredient on label contains the word "whole"), legumes. -can eat small amounts of dairy and lean meat (no larger than the palm of your hand), but avoid processed meats such as ham, bacon, lunch meat, etc. -drink water -try to avoid fast food and pre-packaged foods, processed meat, ultra processed foods/beverages (donuts, candy, etc.) -most experts advise limiting sodium to < 2300mg  per day, should limit further is any chronic conditions such as high blood pressure, heart disease, diabetes, etc. The American Heart Association advised that < 1500mg  is is ideal -try to avoid foods/beverages that contain any ingredients with names you do not recognize  -try to avoid foods/beverages  with added sugar or sweeteners/sweets  -try to avoid sweet drinks (including diet drinks): soda, juice, Gatorade, sweet tea, power drinks, diet drinks -try to avoid white rice, white bread, pasta (unless whole grain)  EXERCISE GUIDELINES FOR ADULTS: -if you wish to increase your physical activity, do so gradually and with the approval of your doctor -STOP and seek medical care immediately if you have any chest pain, chest discomfort or trouble breathing when starting or increasing exercise  -move and stretch your body, legs, feet and arms when sitting for long periods -Physical activity guidelines for optimal health in adults: -get at least 150 minutes per week of moderate exercise (can talk, but not sing); this is about 20-30 minutes of sustained activity 5-7 days per week or two 10-15 minute  episodes of sustained activity 5-7 days per week -do some muscle building/resistance training/strength training at least 2 days per week  -balance exercises 3+ days per week:   Stand somewhere where you have something sturdy to hold onto if you lose balance    1) lift up on toes, then back down, start with 5x per day and work up to 20x   2) stand and lift one leg straight out to the side so that foot is a few inches of the floor, start with 5x each side and work up to 20x each side   3) stand on one foot, start with 5 seconds each side and work up to 20 seconds  on each side  If you need ideas or help with getting more active:  -Silver sneakers https://tools.silversneakers.com  -Walk with a Doc: http://www.duncan-williams.com/  -try to include resistance (weight lifting/strength building) and balance exercises twice per week: or the following link for ideas: http://castillo-powell.com/  BuyDucts.dk  STRESS MANAGEMENT: -can try meditating, or just sitting quietly with deep breathing while intentionally relaxing all parts of your body for 5 minutes daily -if you need further help with stress, anxiety or depression please follow up with your primary doctor or contact the wonderful folks at [facility]: [phone]  SOCIAL CONNECTIONS: -options in [place] if you wish to engage in more social and exercise related activities:  -Silver sneakers https://tools.silversneakers.com  -Walk with a Doc: http://www.duncan-williams.com/  -Check out the [facility] Active Adults 50+ section on the [place] of [employer] (hiking clubs, book clubs, cards and games, chess, exercise classes, aquatic classes and much more) - see the website for details: https://www.[place].gov/departments/parks-recreation/active-adults50  -YouTube has lots of exercise videos for different ages and abilities as  well  -[facility] (a variety of indoor and outdoor inperson activities for adults). [phone]. [address].  -Virtual Online Classes (a variety of topics): see seniorplanet.org or call 506-368-6247  -consider volunteering at a school, hospice center, church, senior center or elsewhere  ADVANCED HEALTHCARE DIRECTIVES:  [place] Advanced Directives assistance:   ExpressWeek.com.cy  Everyone should have advanced health care directives in place. This is so that you get the care you want, should you ever be in a situation where you are unable to make your own medical decisions.   From the [place] Advanced Directive Website: "Advance Health Care Directives are legal documents in which you give written instructions about your health care if, in the future, you cannot speak for yourself.   A health care power of attorney allows you to name a person you trust to make your health care decisions if you cannot make them yourself. A declaration of a desire for a natural death (or living will) is document, which states that you desire not to have your life prolonged by extraordinary measures if you have a terminal or incurable illness or if you are in a vegetative state. An advance instruction for mental health treatment makes a declaration of instructions, information and preferences regarding your mental health treatment. It also states that you are aware that the advance instruction authorizes a mental health treatment provider to act according to your wishes. It may also outline your consent or refusal of mental health treatment. A declaration of an anatomical gift allows anyone over the age of 65 to make a gift by will, organ donor card or other document."   Please see the following website or an elder law attorney for forms, FAQs and for completion of advanced directives: [place]  Print production planner Health Care  Directives Advance Health Care Directives (http://guzman.com/)  Or copy and paste the following to your web browser: [email or web address]            Maurie Southern, DO

## 2024-01-01 ENCOUNTER — Ambulatory Visit (HOSPITAL_COMMUNITY)
Admission: RE | Admit: 2024-01-01 | Discharge: 2024-01-01 | Disposition: A | Discharge status: Home / Self Care | Source: Ambulatory Visit | Attending: Cardiovascular Disease | Admitting: Cardiovascular Disease

## 2024-01-01 ENCOUNTER — Ambulatory Visit: Payer: Self-pay | Admitting: Cardiovascular Disease

## 2024-01-01 DIAGNOSIS — I739 Peripheral vascular disease, unspecified: Secondary | ICD-10-CM | POA: Diagnosis not present

## 2024-01-01 DIAGNOSIS — E785 Hyperlipidemia, unspecified: Secondary | ICD-10-CM | POA: Diagnosis not present

## 2024-01-02 ENCOUNTER — Ambulatory Visit: Payer: Self-pay | Admitting: Cardiovascular Disease

## 2024-01-02 LAB — LIPID PANEL
Chol/HDL Ratio: 4.4 ratio (ref 0.0–4.4)
Cholesterol, Total: 188 mg/dL (ref 100–199)
HDL: 43 mg/dL (ref >39)
LDL Chol Calc (NIH): 94 mg/dL (ref 0–99)
Triglycerides: 307 mg/dL — ABNORMAL HIGH (ref 0–149)
VLDL Cholesterol Cal: 51 mg/dL — ABNORMAL HIGH (ref 5–40)

## 2024-01-02 LAB — VAS US ABI WITH/WO TBI
Left ABI: 0.85
Right ABI: 0.44

## 2024-01-02 LAB — HEPATIC FUNCTION PANEL
ALT: 11 IU/L (ref 0–32)
AST: 17 IU/L (ref 0–40)
Albumin: 4.2 g/dL (ref 3.9–4.9)
Alkaline Phosphatase: 71 IU/L (ref 44–121)
Bilirubin Total: 0.3 mg/dL (ref 0.0–1.2)
Bilirubin, Direct: 0.13 mg/dL (ref 0.00–0.40)
Total Protein: 7.3 g/dL (ref 6.0–8.5)

## 2024-01-04 ENCOUNTER — Telehealth: Payer: Self-pay | Admitting: Cardiovascular Disease

## 2024-01-04 ENCOUNTER — Other Ambulatory Visit: Payer: Self-pay | Admitting: Cardiology

## 2024-01-04 NOTE — Telephone Encounter (Signed)
 Patient has concerns with ABI results and would like a call back to review + patient would like to be worked in for [facility] appointment with Dr. Katheryne Pane soon if at all possible. She says her leg has been in a lot of pain which causes some trouble sleeping.

## 2024-01-04 NOTE — Telephone Encounter (Signed)
 Spoke with pt and she stated that she had already spoke with someone from our office and has an appt with Dr. Katheryne Pane lined up. Pt denied any further questions.

## 2024-01-06 ENCOUNTER — Encounter: Payer: Self-pay | Admitting: Cardiovascular Disease

## 2024-01-06 ENCOUNTER — Ambulatory Visit: Attending: Cardiovascular Disease | Admitting: Cardiovascular Disease

## 2024-01-06 VITALS — BP 122/72 | HR 78 | Ht 64.5 in | Wt 135.0 lb

## 2024-01-06 DIAGNOSIS — Z01810 Encounter for preprocedural cardiovascular examination: Secondary | ICD-10-CM | POA: Diagnosis not present

## 2024-01-06 DIAGNOSIS — Z01812 Encounter for preprocedural laboratory examination: Secondary | ICD-10-CM

## 2024-01-06 DIAGNOSIS — E785 Hyperlipidemia, unspecified: Secondary | ICD-10-CM

## 2024-01-06 DIAGNOSIS — I739 Peripheral vascular disease, unspecified: Secondary | ICD-10-CM

## 2024-01-06 DIAGNOSIS — I10 Essential (primary) hypertension: Secondary | ICD-10-CM | POA: Diagnosis not present

## 2024-01-06 DIAGNOSIS — I2510 Atherosclerotic heart disease of native coronary artery without angina pectoris: Secondary | ICD-10-CM | POA: Diagnosis not present

## 2024-01-06 NOTE — Progress Notes (Signed)
 01/06/2024 [name] [name]   [birth year]  [id number]  Primary Physician Viola Greulich, MD Primary Cardiologist: Avanell Leigh MD [name], [place], [place]  HPI:  [name] [name] is a 68 y.o.   mild to moderately overweight married Caucasian female mother of 1, grandmother of 2 grandchildren referred by [name] Heath, PA-C for peripheral vascular valuation.  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  I last saw her in the office 09/21/2023.  Her risk factors include ongoing tobacco abuse 1/2 pack/day, 11/17/3.  She is accompanied by her husband [name] [name] today.  Hypertension and hyperlipidemia.  She does have CAD status post LAD and circumflex intervention back in January 2020.  She is never had a stroke.  She does have lupus anticoagulant with extensive lower extremity DVT and pulmonary emboli status post endovascular therapy with stenting of her lower extremity venous vasculature as well as placement of an IVC filter.  She was on Coumadin  for some time and transition to Xarelto .  When she was on triple therapy for her LAD and circumflex stent she had GI bleeding.  Aspirin  was discontinued.  She was not tolerant to Plavix .  She does complain of bilateral calf claudication over the last year with Dopplers performed 02/27/2022 revealing a right ABI of 0.57, left of 0.52.  She had high-grade lesions in her distal right SFA and popliteal artery and an occluded left SFA.   I performed peripheral angiography intervention 06/02/2022.  I performed [facility] 1 directional arthrectomy followed by [facility] of 8 short segment CTO in the mid right SFA.  She also had a mid left SFA CTO.  She was discharged home the following day and was readmitted that Friday with pain in her left groin, CT showed hematoma, Doppler showed pseudoaneurysm.  She underwent surgical evacuation and correction of the pseudoaneurysm by Dr. Christia Cowboy.  She just saw Dr. Christia Cowboy in the office last week who  noted that the incision was dry with some surrounding edema.  She does have some paresthesias down the medial aspect of her left thigh.  Her claudication has resolved and her right SFA remained widely patent by duplex ultrasound 06/12/2022.   When I saw her on July 5 of this year she was complaining of recurrent claudication over the last 3 to 4 weeks with Doppler study performed 02/16/2023 revealed a decline in her right ABI to 0.48 with an occlusion of her right popliteal artery.  Our plans are to proceed with angiography and reintervention.  The major issue is going to be Lovenox  bridging and careful attention to her left groin given her previous pseudoaneurysm formation.   I performed peripheral angiography on her 03/19/2023 revealing a distal right SFA CTO.  I performed PTA and stenting using a SUPERA self-expanding stent.  She was discharged home 2 days later.  Her groin is remained stable.  Follow-up Doppler studies performed 03/30/2023 revealed marked improvement in her ABI on the right up to 1.06 with a widely patent stent.  Her claudication has resolved.   Since I saw her 3 months ago she has had recurrent symptoms with tingling numbness and pain in her right calf.  She had Dopplers performed 01/01/2024 which revealed a decline in her right ABI from 1.06 down to 0.44.  Her right SFA stent has occluded.  She will need repeat angiography and potential intervention.  I have asked Dr. Daaiel Starlin Bristol to do this in my absence.   Current  Meds  Medication Sig   amLODipine  (NORVASC ) 2.5 MG tablet TAKE 1 TABLET BY MOUTH TWICE A DAY   bisacodyl  (DULCOLAX) 5 MG EC tablet Take 5 mg by mouth daily as needed for moderate constipation.   carvedilol  (COREG ) 6.25 MG tablet TAKE 0.5 TABLETS (3.125 MG TOTAL) BY MOUTH 2 (TWO) TIMES DAILY WITH A MEAL.   fenofibrate  (TRICOR ) 145 MG tablet TAKE 1 TABLET (145 MG TOTAL) BY MOUTH DAILY. TAKE WITH A MEAL.   lisinopril  (ZESTRIL ) 40 MG tablet TAKE 1 TABLET BY MOUTH EVERY DAY    nitroGLYCERIN  (NITROSTAT ) 0.4 MG SL tablet Place 1 tablet (0.4 mg total) under the tongue every 5 (five) minutes as needed for up to 3 doses for chest pain.   pravastatin  (PRAVACHOL ) 40 MG tablet Take 1 tablet (40 mg total) by mouth every evening.   XARELTO  20 MG TABS tablet TAKE 1 TABLET BY MOUTH DAILY WITH SUPPER.     Allergies  Allergen Reactions   Prednisone Other (See Comments)    No energy "stalls out".    Makes pt  Jittery.  **makes pt have no energy**   Tramadol Other (See Comments) and Nausea Only    Constipation,  Makes pt  Jittery.  constipation   Crestor  [Rosuvastatin  Calcium ]     Feels fatigued, RE  CHALLENGE - UNABLE TO USE 12/28/18   Hctz [Hydrochlorothiazide ]     Dizzy    Repatha  [Evolocumab ] Other (See Comments)    Per patient never took this medication   Zithromax [Azithromycin] Other (See Comments)    jittery    Social History   Socioeconomic History   Marital status: Married    Spouse name: Not on file   Number of children: 2   Years of education: Not on file   Highest education level: Bachelor's degree (e.g., BA, AB, BS)  Occupational History   Occupation: SERVICE MANAGER    Employer: [employer]  Tobacco Use   Smoking status: Every Day    Current packs/day: 0.50    Average packs/day: 0.5 packs/day for 50.4 years (25.2 ttl pk-yrs)    Types: Cigarettes    Start date: 1975   Smokeless tobacco: Never   Tobacco comments:    Cut back to quarter pack a day  Vaping Use   Vaping status: Never Used  Substance and Sexual Activity   Alcohol use: Yes    Comment: Occasional   Drug use: No   Sexual activity: Not on file  Other Topics Concern   Not on file  Social History Narrative   Really is a married mother of 2.  1 son died from complications of spina bifida after multiple surgeries.   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     She has a has a Scientist, water quality in education.     Drinks social alcohol & denies drug  use.     Pt endorses smoking maybe 2 cigarettes/day.  States she placed most of the cigarette, may have 2 puffs.   Social Drivers of Corporate investment banker Strain: Low Risk  (12/28/2023)   Overall Financial Resource Strain (CARDIA)    Difficulty of Paying Living Expenses: Not hard at all  Food Insecurity: No Food Insecurity (12/28/2023)   Hunger Vital Sign    Worried About Running Out of Food in the Last Year: Never true    Ran Out of Food in the Last Year: Never true  Transportation Needs: No Transportation Needs (12/28/2023)  PRAPARE - Administrator, Civil Service (Medical): No    Lack of Transportation (Non-Medical): No  Physical Activity: Insufficiently Active (12/28/2023)   Exercise Vital Sign    Days of Exercise per Week: 2 days    Minutes of Exercise per Session: 20 min  Stress: No Stress Concern Present (12/28/2023)   [employer] of Occupational Health - Occupational Stress Questionnaire    Feeling of Stress : Not at all  Social Connections: Socially Integrated (12/28/2023)   Social Connection and Isolation Panel [NHANES]    Frequency of Communication with Friends and Family: More than three times a week    Frequency of Social Gatherings with Friends and Family: Three times a week    Attends Religious Services: More than 4 times per year    Active Member of Clubs or Organizations: Yes    Attends Banker Meetings: 1 to 4 times per year    Marital Status: Married  Catering manager Violence: Not At Risk (10/27/2022)   Humiliation, Afraid, Rape, and Kick questionnaire    Fear of Current or Ex-Partner: No    Emotionally Abused: No    Physically Abused: No    Sexually Abused: No     Review of Systems: General: negative for chills, fever, night sweats or weight changes.  Cardiovascular: negative for chest pain, dyspnea on exertion, edema, orthopnea, palpitations, paroxysmal nocturnal dyspnea or shortness of breath Dermatological: negative for  rash Respiratory: negative for cough or wheezing Urologic: negative for hematuria Abdominal: negative for nausea, vomiting, diarrhea, bright red blood per rectum, melena, or hematemesis Neurologic: negative for visual changes, syncope, or dizziness All other systems reviewed and are otherwise negative except as noted above.    Blood pressure 122/72, pulse 78, height 5' 4.5" (1.638 m), weight 135 lb (61.2 kg), SpO2 100%.  General appearance: alert and no distress Neck: no adenopathy, no carotid bruit, no JVD, supple, symmetrical, trachea midline, and thyroid  not enlarged, symmetric, no tenderness/mass/nodules Lungs: clear to auscultation bilaterally Heart: regular rate and rhythm, S1, S2 normal, no murmur, click, rub or gallop Extremities: extremities normal, atraumatic, no cyanosis or edema Pulses: Absent right pedal pulse Skin: Skin color, texture, turgor normal. No rashes or lesions Neurologic: Grossly normal  EKG EKG Interpretation Date/Time:  Wednesday Jan 06 2024 11:09:44 EDT Ventricular Rate:  78 PR Interval:  166 QRS Duration:  80 QT Interval:  374 QTC Calculation: 426 R Axis:   45  Text Interpretation: Normal sinus rhythm Possible Left atrial enlargement When compared with ECG of 10-Apr-2023 14:21, Criteria for Septal infarct are no longer Present Confirmed by Lauro Portal [phone]) on 01/06/2024 11:36:26 AM    ASSESSMENT AND PLAN:   Claudication in peripheral vascular disease (HCC) Ms. Krogh returns today for follow-up.  I performed intervention on a right SFA CTO 03/19/2023 and placed a superior self-expanding stent.  Her postprocedure Dopplers were normal.  She had Dopplers performed 01/01/2024 which were revealed a decline in her right ABI from 1.06-0.044 with occlusion of her stent.  I am concerned that this is a thrombotic event.  I am going to arrange for her to undergo peripheral angiography by Dr. Shona Pardo Bristol.  She will need to stop her Xarelto  at least 2 to 3 days prior  to the procedure and have a Lovenox  bridge.     Avanell Leigh MD FACP,FACC,FAHA, [facility] 01/06/2024 11:49 AM

## 2024-01-06 NOTE — Patient Instructions (Addendum)
 Medication Instructions:  Your physician recommends that you continue on your current medications as directed. Please refer to the Current Medication list given to you today.    *If you need a refill on your cardiac medications before your next appointment, please call your pharmacy*   Lab Work: CBC BMET    If you have labs (blood work) drawn today and your tests are completely normal, you will receive your results only by: MyChart Message (if you have MyChart) OR A paper copy in the mail If you have any lab test that is abnormal or we need to change your treatment, we will call you to review the results.   Testing/Procedures: .  Follow-Up: At [facility], you and your health needs are our priority.  As part of our continuing mission to provide you with exceptional heart care, we have created designated Provider Care Teams.  These Care Teams include your primary Cardiologist (physician) and Advanced Practice Providers (APPs -  Physician Assistants and Nurse Practitioners) who all work together to provide you with the care you need, when you need it.  We recommend signing up for the patient portal called "MyChart".  Sign up information is provided on this After Visit Summary.  MyChart is used to connect with patients for Virtual Visits (Telemedicine).  Patients are able to view lab/test results, encounter notes, upcoming appointments, etc.  Non-urgent messages can be sent to your provider as well.   To learn more about what you can do with MyChart, go to [email or web address].    Your next appointment:   3 week(s)  The format for your next appointment:   In Person  Provider:   Randene Bustard, MD   Other Instructions

## 2024-01-06 NOTE — Assessment & Plan Note (Signed)
 [name] [name] returns today for follow-up.  I performed intervention on a right SFA CTO 03/19/2023 and placed a superior self-expanding stent.  Her postprocedure Dopplers were normal.  She had Dopplers performed 01/01/2024 which were revealed a decline in her right ABI from 1.06-0.044 with occlusion of her stent.  I am concerned that this is a thrombotic event.  I am going to arrange for her to undergo peripheral angiography by Dr. Arlyne Brandes Bristol.  She will need to stop her Xarelto  at least 2 to 3 days prior to the procedure and have a Lovenox  bridge.

## 2024-01-06 NOTE — H&P (View-Only) (Signed)
 01/06/2024 [name] [name]   [birth year]  [id number]  Primary Physician Viola Greulich, MD Primary Cardiologist: Avanell Leigh MD [name], [place], [place]  HPI:  [name] [name] is a 68 y.o.   mild to moderately overweight married Caucasian female mother of 1, grandmother of 2 grandchildren referred by Ervin Heath, PA-C for peripheral vascular valuation.  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  I last saw her in the office 09/21/2023.  Her risk factors include ongoing tobacco abuse 1/2 pack/day, 11/17/3.  She is accompanied by her husband [name] [name] today.  Hypertension and hyperlipidemia.  She does have CAD status post LAD and circumflex intervention back in January 2020.  She is never had a stroke.  She does have lupus anticoagulant with extensive lower extremity DVT and pulmonary emboli status post endovascular therapy with stenting of her lower extremity venous vasculature as well as placement of an IVC filter.  She was on Coumadin  for some time and transition to Xarelto .  When she was on triple therapy for her LAD and circumflex stent she had GI bleeding.  Aspirin  was discontinued.  She was not tolerant to Plavix .  She does complain of bilateral calf claudication over the last year with Dopplers performed 02/27/2022 revealing a right ABI of 0.57, left of 0.52.  She had high-grade lesions in her distal right SFA and popliteal artery and an occluded left SFA.   I performed peripheral angiography intervention 06/02/2022.  I performed [facility] 1 directional arthrectomy followed by [facility] of 8 short segment CTO in the mid right SFA.  She also had a mid left SFA CTO.  She was discharged home the following day and was readmitted that Friday with pain in her left groin, CT showed hematoma, Doppler showed pseudoaneurysm.  She underwent surgical evacuation and correction of the pseudoaneurysm by Dr. Christia Cowboy.  She just saw Dr. Christia Cowboy in the office last week who  noted that the incision was dry with some surrounding edema.  She does have some paresthesias down the medial aspect of her left thigh.  Her claudication has resolved and her right SFA remained widely patent by duplex ultrasound 06/12/2022.   When I saw her on July 5 of this year she was complaining of recurrent claudication over the last 3 to 4 weeks with Doppler study performed 02/16/2023 revealed a decline in her right ABI to 0.48 with an occlusion of her right popliteal artery.  Our plans are to proceed with angiography and reintervention.  The major issue is going to be Lovenox  bridging and careful attention to her left groin given her previous pseudoaneurysm formation.   I performed peripheral angiography on her 03/19/2023 revealing a distal right SFA CTO.  I performed PTA and stenting using a SUPERA self-expanding stent.  She was discharged home 2 days later.  Her groin is remained stable.  Follow-up Doppler studies performed 03/30/2023 revealed marked improvement in her ABI on the right up to 1.06 with a widely patent stent.  Her claudication has resolved.   Since I saw her 3 months ago she has had recurrent symptoms with tingling numbness and pain in her right calf.  She had Dopplers performed 01/01/2024 which revealed a decline in her right ABI from 1.06 down to 0.44.  Her right SFA stent has occluded.  She will need repeat angiography and potential intervention.  I have asked Dr. Daaiel Starlin [name] to do this in my absence.   Current  Meds  Medication Sig   amLODipine  (NORVASC ) 2.5 MG tablet TAKE 1 TABLET BY MOUTH TWICE A DAY   bisacodyl  (DULCOLAX) 5 MG EC tablet Take 5 mg by mouth daily as needed for moderate constipation.   carvedilol  (COREG ) 6.25 MG tablet TAKE 0.5 TABLETS (3.125 MG TOTAL) BY MOUTH 2 (TWO) TIMES DAILY WITH A MEAL.   fenofibrate  (TRICOR ) 145 MG tablet TAKE 1 TABLET (145 MG TOTAL) BY MOUTH DAILY. TAKE WITH A MEAL.   lisinopril  (ZESTRIL ) 40 MG tablet TAKE 1 TABLET BY MOUTH EVERY DAY    nitroGLYCERIN  (NITROSTAT ) 0.4 MG SL tablet Place 1 tablet (0.4 mg total) under the tongue every 5 (five) minutes as needed for up to 3 doses for chest pain.   pravastatin  (PRAVACHOL ) 40 MG tablet Take 1 tablet (40 mg total) by mouth every evening.   XARELTO  20 MG TABS tablet TAKE 1 TABLET BY MOUTH DAILY WITH SUPPER.     Allergies  Allergen Reactions   Prednisone Other (See Comments)    No energy "stalls out".    Makes pt  Jittery.  **makes pt have no energy**   Tramadol Other (See Comments) and Nausea Only    Constipation,  Makes pt  Jittery.  constipation   Crestor  [Rosuvastatin  Calcium ]     Feels fatigued, RE  CHALLENGE - UNABLE TO USE 12/28/18   Hctz [Hydrochlorothiazide ]     Dizzy    Repatha  [Evolocumab ] Other (See Comments)    Per patient never took this medication   Zithromax [Azithromycin] Other (See Comments)    jittery    Social History   Socioeconomic History   Marital status: Married    Spouse name: Not on file   Number of children: 2   Years of education: Not on file   Highest education level: Bachelor's degree (e.g., BA, AB, BS)  Occupational History   Occupation: SERVICE MANAGER    Employer: [employer]  Tobacco Use   Smoking status: Every Day    Current packs/day: 0.50    Average packs/day: 0.5 packs/day for 50.4 years (25.2 ttl pk-yrs)    Types: Cigarettes    Start date: 1975   Smokeless tobacco: Never   Tobacco comments:    Cut back to quarter pack a day  Vaping Use   Vaping status: Never Used  Substance and Sexual Activity   Alcohol use: Yes    Comment: Occasional   Drug use: No   Sexual activity: Not on file  Other Topics Concern   Not on file  Social History Narrative   Really is a married mother of 2.  1 son died from complications of spina bifida after multiple surgeries.   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     She has a has a Scientist, water quality in education.     Drinks social alcohol & denies drug  use.     Pt endorses smoking maybe 2 cigarettes/day.  States she placed most of the cigarette, may have 2 puffs.   Social Drivers of Corporate investment banker Strain: Low Risk  (12/28/2023)   Overall Financial Resource Strain (CARDIA)    Difficulty of Paying Living Expenses: Not hard at all  Food Insecurity: No Food Insecurity (12/28/2023)   Hunger Vital Sign    Worried About Running Out of Food in the Last Year: Never true    Ran Out of Food in the Last Year: Never true  Transportation Needs: No Transportation Needs (12/28/2023)  PRAPARE - Administrator, Civil Service (Medical): No    Lack of Transportation (Non-Medical): No  Physical Activity: Insufficiently Active (12/28/2023)   Exercise Vital Sign    Days of Exercise per Week: 2 days    Minutes of Exercise per Session: 20 min  Stress: No Stress Concern Present (12/28/2023)   [employer] of Occupational Health - Occupational Stress Questionnaire    Feeling of Stress : Not at all  Social Connections: Socially Integrated (12/28/2023)   Social Connection and Isolation Panel [NHANES]    Frequency of Communication with Friends and Family: More than three times a week    Frequency of Social Gatherings with Friends and Family: Three times a week    Attends Religious Services: More than 4 times per year    Active Member of Clubs or Organizations: Yes    Attends Banker Meetings: 1 to 4 times per year    Marital Status: Married  Catering manager Violence: Not At Risk (10/27/2022)   Humiliation, Afraid, Rape, and Kick questionnaire    Fear of Current or Ex-Partner: No    Emotionally Abused: No    Physically Abused: No    Sexually Abused: No     Review of Systems: General: negative for chills, fever, night sweats or weight changes.  Cardiovascular: negative for chest pain, dyspnea on exertion, edema, orthopnea, palpitations, paroxysmal nocturnal dyspnea or shortness of breath Dermatological: negative for  rash Respiratory: negative for cough or wheezing Urologic: negative for hematuria Abdominal: negative for nausea, vomiting, diarrhea, bright red blood per rectum, melena, or hematemesis Neurologic: negative for visual changes, syncope, or dizziness All other systems reviewed and are otherwise negative except as noted above.    Blood pressure 122/72, pulse 78, height 5' 4.5" (1.638 m), weight 135 lb (61.2 kg), SpO2 100%.  General appearance: alert and no distress Neck: no adenopathy, no carotid bruit, no JVD, supple, symmetrical, trachea midline, and thyroid  not enlarged, symmetric, no tenderness/mass/nodules Lungs: clear to auscultation bilaterally Heart: regular rate and rhythm, S1, S2 normal, no murmur, click, rub or gallop Extremities: extremities normal, atraumatic, no cyanosis or edema Pulses: Absent right pedal pulse Skin: Skin color, texture, turgor normal. No rashes or lesions Neurologic: Grossly normal  EKG EKG Interpretation Date/Time:  Wednesday Jan 06 2024 11:09:44 EDT Ventricular Rate:  78 PR Interval:  166 QRS Duration:  80 QT Interval:  374 QTC Calculation: 426 R Axis:   45  Text Interpretation: Normal sinus rhythm Possible Left atrial enlargement When compared with ECG of 10-Apr-2023 14:21, Criteria for Septal infarct are no longer Present Confirmed by Lauro Portal [phone]) on 01/06/2024 11:36:26 AM    ASSESSMENT AND PLAN:   Claudication in peripheral vascular disease (HCC) Ms. Krogh returns today for follow-up.  I performed intervention on a right SFA CTO 03/19/2023 and placed a superior self-expanding stent.  Her postprocedure Dopplers were normal.  She had Dopplers performed 01/01/2024 which were revealed a decline in her right ABI from 1.06-0.044 with occlusion of her stent.  I am concerned that this is a thrombotic event.  I am going to arrange for her to undergo peripheral angiography by Dr. Shona Pardo [name].  She will need to stop her Xarelto  at least 2 to 3 days prior  to the procedure and have a Lovenox  bridge.     Avanell Leigh MD FACP,FACC,FAHA, [facility] 01/06/2024 11:49 AM

## 2024-01-07 ENCOUNTER — Telehealth: Payer: Self-pay | Admitting: *Deleted

## 2024-01-07 ENCOUNTER — Other Ambulatory Visit (HOSPITAL_COMMUNITY): Payer: Self-pay

## 2024-01-07 ENCOUNTER — Telehealth: Payer: Self-pay | Admitting: Cardiovascular Disease

## 2024-01-07 ENCOUNTER — Other Ambulatory Visit: Payer: Self-pay | Admitting: Cardiovascular Disease

## 2024-01-07 DIAGNOSIS — Z7902 Long term (current) use of antithrombotics/antiplatelets: Secondary | ICD-10-CM

## 2024-01-07 DIAGNOSIS — I739 Peripheral vascular disease, unspecified: Secondary | ICD-10-CM

## 2024-01-07 DIAGNOSIS — Z86718 Personal history of other venous thrombosis and embolism: Secondary | ICD-10-CM

## 2024-01-07 DIAGNOSIS — D6859 Other primary thrombophilia: Secondary | ICD-10-CM

## 2024-01-07 LAB — CBC
Hematocrit: 37.5 % (ref 34.0–46.6)
Hemoglobin: 12.1 g/dL (ref 11.1–15.9)
MCH: 28.9 pg (ref 26.6–33.0)
MCHC: 32.3 g/dL (ref 31.5–35.7)
MCV: 90 fL (ref 79–97)
Platelets: 538 10*3/uL — ABNORMAL HIGH (ref 150–450)
RBC: 4.19 x10E6/uL (ref 3.77–5.28)
RDW: 13 % (ref 11.7–15.4)
WBC: 9.6 10*3/uL (ref 3.4–10.8)

## 2024-01-07 LAB — BASIC METABOLIC PANEL WITH GFR
BUN/Creatinine Ratio: 14 (ref 12–28)
BUN: 13 mg/dL (ref 8–27)
CO2: 21 mmol/L (ref 20–29)
Calcium: 10.4 mg/dL — ABNORMAL HIGH (ref 8.7–10.3)
Chloride: 100 mmol/L (ref 96–106)
Creatinine, Ser: 0.92 mg/dL (ref 0.57–1.00)
Glucose: 91 mg/dL (ref 70–99)
Potassium: 4.2 mmol/L (ref 3.5–5.2)
Sodium: 139 mmol/L (ref 134–144)
eGFR: 68 mL/min/{1.73_m2} (ref >59)

## 2024-01-07 MED ORDER — ENOXAPARIN SODIUM 100 MG/ML IJ SOSY
100.0000 mg | PREFILLED_SYRINGE | Freq: Every evening | INTRAMUSCULAR | 0 refills | Status: DC
  Filled 2024-01-07: qty 2, 2d supply, fill #0

## 2024-01-07 MED ORDER — ENOXAPARIN SODIUM 100 MG/ML IJ SOSY: 100.0000 mg | PREFILLED_SYRINGE | Freq: Every evening | INTRAMUSCULAR | 0 refills | Status: DC

## 2024-01-07 NOTE — Progress Notes (Signed)
 Erroneous encounter

## 2024-01-07 NOTE — Telephone Encounter (Signed)
 Lovenox  Bridge needed per Dr Katheryne Pane:  5/23: Last dose of Xarelto     5/24: Inject enoxaparin  100mg   in the fatty tissue in the evening; no Xarelto    5/25: Inject enoxaparin  100mg  in the fatty tissue in the evening; no Xarelto    5/26: No enoxaparin ,  no Xarelto    5/27: Procedure Day - No enoxaparin  - Resume Xarelto  in the evening or as directed by physician

## 2024-01-07 NOTE — Telephone Encounter (Signed)
 Spoke with pt regarding PV angiogram procedure. Pt is scheduled for procedure on Tuesday, 5/27 @ 12:30pm. Pt is aware of procedure time and date. Pt has labs drawn yesterday. Pt will also need a lovenox  bridge for lupus anticoagulant. Will schedule pt's follow up dopplers and office visit. Will post to mychart, pt will give us  a call if times and dates do not working with her schedule. Mychart message sent with procedure instructions. Pt has no further questions at this time.   Spoke with PharmD. They will give her a call later today to review lovenox  bridge instructions.

## 2024-01-07 NOTE — Telephone Encounter (Signed)
 LEA scheduled at [facility] for: Tuesday Jan 12, 2024 12:30 PM Arrival time [facility] Main Entrance A at: 10:30 AM  Nothing to eat after midnight prior to procedure, clear liquids until 5 AM day of procedure.  Medication instructions: -Hold:  Xarelto /Lovenox  bridge -last dose Xarelto  01/08/24 until post procedure-see other phone note today for details -Other usual morning medications can be taken with sips of water including aspirin  81 mg.  Plan to go home the same day, you will only stay overnight if medically necessary.  You must have responsible adult to drive you home.  Someone must be with you the first 24 hours after you arrive home.  Reviewed procedure instructions with patient.

## 2024-01-07 NOTE — Telephone Encounter (Signed)
 Patient called to follow-up on getting her procedure scheduled.

## 2024-01-07 NOTE — Addendum Note (Signed)
 Addended by: Sunny English on: 01/07/2024 04:03 PM   Modules accepted: Orders

## 2024-01-08 ENCOUNTER — Ambulatory Visit: Payer: Self-pay | Admitting: Cardiovascular Disease

## 2024-01-12 ENCOUNTER — Encounter (HOSPITAL_COMMUNITY)
Admission: RE | Disposition: A | Discharge status: Home / Self Care | Payer: Self-pay | Source: Ambulatory Visit | Attending: Cardiology

## 2024-01-12 ENCOUNTER — Other Ambulatory Visit: Payer: Self-pay

## 2024-01-12 ENCOUNTER — Ambulatory Visit (HOSPITAL_COMMUNITY)
Admission: RE | Admit: 2024-01-12 | Discharge: 2024-01-12 | Disposition: A | Discharge status: Home / Self Care | Source: Ambulatory Visit | Attending: Cardiology | Admitting: Cardiology

## 2024-01-12 DIAGNOSIS — I700 Atherosclerosis of aorta: Secondary | ICD-10-CM | POA: Diagnosis not present

## 2024-01-12 DIAGNOSIS — Z7901 Long term (current) use of anticoagulants: Secondary | ICD-10-CM | POA: Insufficient documentation

## 2024-01-12 DIAGNOSIS — I10 Essential (primary) hypertension: Secondary | ICD-10-CM | POA: Diagnosis not present

## 2024-01-12 DIAGNOSIS — Z95820 Peripheral vascular angioplasty status with implants and grafts: Secondary | ICD-10-CM | POA: Diagnosis not present

## 2024-01-12 DIAGNOSIS — Z79899 Other long term (current) drug therapy: Secondary | ICD-10-CM | POA: Diagnosis not present

## 2024-01-12 DIAGNOSIS — F17210 Nicotine dependence, cigarettes, uncomplicated: Secondary | ICD-10-CM | POA: Insufficient documentation

## 2024-01-12 DIAGNOSIS — I739 Peripheral vascular disease, unspecified: Secondary | ICD-10-CM

## 2024-01-12 DIAGNOSIS — I2510 Atherosclerotic heart disease of native coronary artery without angina pectoris: Secondary | ICD-10-CM | POA: Insufficient documentation

## 2024-01-12 DIAGNOSIS — Z955 Presence of coronary angioplasty implant and graft: Secondary | ICD-10-CM | POA: Insufficient documentation

## 2024-01-12 DIAGNOSIS — Z86718 Personal history of other venous thrombosis and embolism: Secondary | ICD-10-CM | POA: Insufficient documentation

## 2024-01-12 DIAGNOSIS — E7800 Pure hypercholesterolemia, unspecified: Secondary | ICD-10-CM | POA: Diagnosis not present

## 2024-01-12 DIAGNOSIS — I70213 Atherosclerosis of native arteries of extremities with intermittent claudication, bilateral legs: Secondary | ICD-10-CM | POA: Diagnosis not present

## 2024-01-12 DIAGNOSIS — I2699 Other pulmonary embolism without acute cor pulmonale: Secondary | ICD-10-CM

## 2024-01-12 HISTORY — PX: LOWER EXTREMITY ANGIOGRAPHY: CATH118251

## 2024-01-12 SURGERY — LOWER EXTREMITY ANGIOGRAPHY
Anesthesia: LOCAL

## 2024-01-12 MED ORDER — HEPARIN (PORCINE) IN NACL 1000-0.9 UT/500ML-% IV SOLN
INTRAVENOUS | Status: DC | PRN
  Administered 2024-01-12: 1000 mL

## 2024-01-12 MED ORDER — MIDAZOLAM HCL 2 MG/2ML IJ SOLN
INTRAMUSCULAR | Status: AC
  Filled 2024-01-12: qty 2

## 2024-01-12 MED ORDER — ASPIRIN 81 MG PO CHEW: 81.0000 mg | CHEWABLE_TABLET | ORAL | Status: DC

## 2024-01-12 MED ORDER — SODIUM CHLORIDE 0.9 % WEIGHT BASED INFUSION: 3.0000 mL/kg/h | INTRAVENOUS | Status: AC

## 2024-01-12 MED ORDER — ACETAMINOPHEN 325 MG PO TABS: 650.0000 mg | ORAL_TABLET | ORAL | Status: DC | PRN

## 2024-01-12 MED ORDER — LIDOCAINE HCL (PF) 1 % IJ SOLN
INTRAMUSCULAR | Status: DC | PRN
  Administered 2024-01-12: 5 mL

## 2024-01-12 MED ORDER — IODIXANOL 320 MG/ML IV SOLN
INTRAVENOUS | Status: DC | PRN
Start: 2024-01-12 — End: 2024-01-13
  Administered 2024-01-12: 70 mL

## 2024-01-12 MED ORDER — SODIUM CHLORIDE 0.9 % IV SOLN: 250.0000 mL | INTRAVENOUS | Status: DC | PRN

## 2024-01-12 MED ORDER — FENTANYL CITRATE (PF) 100 MCG/2ML IJ SOLN
INTRAMUSCULAR | Status: DC | PRN
  Administered 2024-01-12 (×2): 50 ug via INTRAVENOUS

## 2024-01-12 MED ORDER — SODIUM CHLORIDE 0.9% FLUSH: 3.0000 mL | INTRAVENOUS | Status: DC | PRN

## 2024-01-12 MED ORDER — ONDANSETRON HCL 4 MG/2ML IJ SOLN: 4.0000 mg | Freq: Four times a day (QID) | INTRAMUSCULAR | Status: DC | PRN

## 2024-01-12 MED ORDER — FENTANYL CITRATE (PF) 100 MCG/2ML IJ SOLN
INTRAMUSCULAR | Status: AC
  Filled 2024-01-12: qty 2

## 2024-01-12 MED ORDER — LIDOCAINE HCL (PF) 1 % IJ SOLN
INTRAMUSCULAR | Status: AC
  Filled 2024-01-12: qty 30

## 2024-01-12 MED ORDER — SODIUM CHLORIDE 0.9 % WEIGHT BASED INFUSION: 1.0000 mL/kg/h | INTRAVENOUS | Status: DC

## 2024-01-12 MED ORDER — MIDAZOLAM HCL 2 MG/2ML IJ SOLN
INTRAMUSCULAR | Status: DC | PRN
  Administered 2024-01-12: 2 mg via INTRAVENOUS

## 2024-01-12 SURGICAL SUPPLY — 9 items
CATH OMNI FLUSH 5F 65CM (CATHETERS) IMPLANT
CLOSURE MYNX CONTROL 5F (Vascular Products) IMPLANT
KIT MICROPUNCTURE NIT STIFF (SHEATH) IMPLANT
SET ATX-X65L (MISCELLANEOUS) IMPLANT
SHEATH PINNACLE 5F 10CM (SHEATH) IMPLANT
SHEATH PROBE COVER 6X72 (BAG) IMPLANT
TRAY PV CATH (CUSTOM PROCEDURE TRAY) ×1 IMPLANT
WIRE BENTSON .035X145CM (WIRE) IMPLANT
WIRE TORQFLEX AUST .018X40CM (WIRE) IMPLANT

## 2024-01-12 NOTE — Discharge Instructions (Signed)
 Femoral Site Care This sheet gives you information about how to care for yourself after your procedure. Your health care provider may also give you more specific instructions. If you have problems or questions, contact your health care provider. What can I expect after the procedure?  After the procedure, it is common to have: Bruising that usually fades within 1-2 weeks. Tenderness at the site. Follow these instructions at home: Wound care Follow instructions from your health care provider about how to take care of your insertion site. Make sure you: Wash your hands with soap and water before you change your bandage (dressing). If soap and water are not available, use hand sanitizer. Remove your dressing as told by your health care provider. In 24 hours Do not take baths, swim, or use a hot tub until your health care provider approves. You may shower 24-48 hours after the procedure or as told by your health care provider. Gently wash the site with plain soap and water. Pat the area dry with a clean towel. Do not rub the site. This may cause bleeding. Do not apply powder or lotion to the site. Keep the site clean and dry. Check your femoral site every day for signs of infection. Check for: Redness, swelling, or pain. Fluid or blood. Warmth. Pus or a bad smell. Activity For the first 2-3 days after your procedure, or as long as directed: Avoid climbing stairs as much as possible. Do not squat. Do not lift anything that is heavier than 10 lb (4.5 kg), or the limit that you are told, until your health care provider says that it is safe. For 5 days Rest as directed. Avoid sitting for a long time without moving. Get up to take short walks every 1-2 hours. Do not drive for 24 hours if you were given a medicine to help you relax (sedative). General instructions Take over-the-counter and prescription medicines only as told by your health care provider. Keep all follow-up visits as told by  your health care provider. This is important. Contact a health care provider if you have: A fever or chills. You have redness, swelling, or pain around your insertion site. Get help right away if: The catheter insertion area swells very fast. You pass out. You suddenly start to sweat or your skin gets clammy. The catheter insertion area is bleeding, and the bleeding does not stop when you hold steady pressure on the area. The area near or just beyond the catheter insertion site becomes pale, cool, tingly, or numb. These symptoms may represent a serious problem that is an emergency. Do not wait to see if the symptoms will go away. Get medical help right away. Call your local emergency services (911 in the [place] Do not drive yourself to the hospital. Summary After the procedure, it is common to have bruising that usually fades within 1-2 weeks. Check your femoral site every day for signs of infection. Do not lift anything that is heavier than 10 lb (4.5 kg), or the limit that you are told, until your health care provider says that it is safe. This information is not intended to replace advice given to you by your health care provider. Make sure you discuss any questions you have with your health care provider. Document Revised: 08/17/2017 Document Reviewed: 08/17/2017 Elsevier Patient Education  2020 [employer].

## 2024-01-12 NOTE — Interval H&P Note (Signed)
 History and Physical Interval Note:  01/12/2024 3:57 PM  [name] [name]  has presented today for surgery, with the diagnosis of peripheral artery disease.  The various methods of treatment have been discussed with the patient and family. After consideration of risks, benefits and other options for treatment, the patient has consented to  Procedure(s): Lower Extremity Angiography (N/A) and possible angioplasty as a surgical intervention.  The patient's history has been reviewed, patient examined, no change in status, stable for surgery.  I have reviewed the patient's chart and labs.  Questions were answered to the patient's satisfaction.   I have personally met the patient both in the outpatient basis and inpatient basis and have discussed the procedural issues with the patient in detail.  Patient is willing to proceed.  Knox Perl

## 2024-01-13 ENCOUNTER — Encounter (HOSPITAL_COMMUNITY): Payer: Self-pay | Admitting: Cardiology

## 2024-01-18 ENCOUNTER — Telehealth (HOSPITAL_COMMUNITY): Payer: Self-pay | Admitting: *Deleted

## 2024-01-18 ENCOUNTER — Other Ambulatory Visit: Payer: Self-pay

## 2024-01-18 MED ORDER — AMLODIPINE BESYLATE 2.5 MG PO TABS: 2.5000 mg | ORAL_TABLET | Freq: Two times a day (BID) | ORAL | 3 refills | Status: AC

## 2024-01-18 NOTE — Telephone Encounter (Signed)
 I called her this morning, extensive discussion with the patient regarding her peripheral arterial disease.  Fortunately she has not had any complications from the procedure and she is extremely pleased by this.  I discussed with her regarding options of peripheral revascularization.  I have discussed with Dr. Alvenia Aus we both feel that bypass surgery for claudication is probably not the best option in view of small vessels and long-term durability, would recommend exercise program for severe claudication.  If symptoms persist, then we will consider revascularization, probably laser atherectomy/laser revascularization will be appropriate in the setting.  I also reassured the patient that she is not at any risk for limb loss as she has extensive collaterals.  In view of extensive collaterals, patient having CTO, I also feel that there is no increased risk of complications from attempting percutaneous revascularization.  Patient is still aware that complications can still happen including embolic complications with the procedure.  After discussions, patient is willing to start cardiac rehab/exercise program and if symptoms persist, will discuss with Dr. Katheryne Pane regarding revascularization options.  Patient was very thankful.

## 2024-01-18 NOTE — Telephone Encounter (Signed)
 Received referral from Dr. Ganji for this pt to participate in Supervised exercise therapy/SET program with the diagnosis of PAD.  Noted that pt lives in [place].  Called to verify [place] is the preferred location.  Ellarie indicated that its about the same distance and [place] will be ok.  Reviewed with pt SET program goals and general information about the program.  Pt eager to participate.  Will forward referral to the SET program coordinator for follow up. Lettie Ray, BSN Cardiac and Emergency planning/management officer

## 2024-01-18 NOTE — Addendum Note (Signed)
 Addended by: Federico Hopkins on: 01/18/2024 08:35 AM   Modules accepted: Orders

## 2024-01-18 NOTE — Addendum Note (Signed)
 Addended by: Deforest Fast on: 01/18/2024 07:27 AM   Modules accepted: Orders

## 2024-01-19 ENCOUNTER — Other Ambulatory Visit (HOSPITAL_COMMUNITY): Payer: Self-pay

## 2024-01-19 DIAGNOSIS — I70211 Atherosclerosis of native arteries of extremities with intermittent claudication, right leg: Secondary | ICD-10-CM

## 2024-01-22 ENCOUNTER — Telehealth (HOSPITAL_COMMUNITY): Payer: Self-pay

## 2024-01-22 NOTE — Telephone Encounter (Signed)
 Received referral from Dr. Ganji for this pt to participate in the supervised exercise therapy program with the diagnosis of Atherosclerosis of the native right leg with intermittent claudication. Pt seen for LE angiography and SET was recommended for treatment. During this visit, the patient received information regarding cardiovascular disease and PAD risk factor reduction, which could include education, counseling, behavior interventions, and outcome assessments. Pt had ABI completed on 01/01/24 which showed 0.44 Right and 0.85 Left.   Pt stated she is interested in the program, will send information over to our support staff to verify insurance benefits.   Barkley Li MS, ACSM-CEP  01/22/2024 7:42 AM

## 2024-01-22 NOTE — Telephone Encounter (Signed)
 Pt insurance is active and benefits verified through [facility]. Co-pay $15.00, DED $0.00/$0.00 met, out of pocket $3,900.00/$555.31 met, co-insurance 0%. No pre-authorization required. Sammy/[employer] Medicare, 01/22/24 @ 9:21AM, OZH#[id number]

## 2024-01-22 NOTE — Telephone Encounter (Signed)
 Called patient to see if she was interested in participating in the SET Rehab Program. Patient will come in for orientation on 01/26/24 @ 10:30AM and will attend the 10:15AM exercise class.   Pensions consultant.

## 2024-01-26 ENCOUNTER — Encounter (HOSPITAL_COMMUNITY)
Admission: RE | Admit: 2024-01-26 | Discharge: 2024-01-26 | Disposition: A | Discharge status: Home / Self Care | Source: Ambulatory Visit | Attending: Cardiology | Admitting: Cardiology

## 2024-01-26 ENCOUNTER — Encounter (HOSPITAL_COMMUNITY): Payer: Self-pay

## 2024-01-26 ENCOUNTER — Telehealth (HOSPITAL_COMMUNITY): Payer: Self-pay

## 2024-01-26 VITALS — BP 138/67 | HR 85 | Ht 64.0 in | Wt 135.6 lb

## 2024-01-26 DIAGNOSIS — I70211 Atherosclerosis of native arteries of extremities with intermittent claudication, right leg: Secondary | ICD-10-CM | POA: Insufficient documentation

## 2024-01-26 NOTE — Telephone Encounter (Signed)
 Sent reminder about SET appt to [name] [name]'s Epic messages because I was unable to reach her by phone.

## 2024-01-26 NOTE — Progress Notes (Signed)
 SET Medication Review   Does the patient  feel that his/her medications are working for him/her?  Yes  Has the patient been experiencing any side effects to the medications prescribed?   No Does the patient measure his/her own blood pressure or blood glucose at home?   No  Does the patient have any problems obtaining medications due to transportation or finances?    No  Understanding of regimen: excellent Understanding of indications: excellent Potential of compliance: excellent    Comments: Meds reviewed with pt.     Barkley Li MS, ACSM-CEP 01/26/2024  11:56 AM

## 2024-01-26 NOTE — Progress Notes (Signed)
 PAT/SET Individual Treatment Plan  Patient Details  Name: [name] [name] MRN: [id number] Date of Birth: 07-05-1956 Referring Provider:   Gattis Kass Supervised Exercise Therapy from 01/26/2024 in [facility] for Heart, Vascular, & Lung Health  Referring Provider Dr. Noel Bathe       Initial Encounter Date:  Flowsheet Row Supervised Exercise Therapy from 01/26/2024 in [facility] for Heart, Vascular, & Lung Health  Date 01/26/24       Visit Diagnosis: Atherosclerosis of native arteries of extremities with intermittent claudication, right leg (HCC)  Patient's Home Medications on Admission:  Current Outpatient Medications:    acetaminophen  (TYLENOL ) 650 MG CR tablet, Take 650 mg by mouth every 8 (eight) hours as needed for pain., Disp: , Rfl:    amLODipine  (NORVASC ) 2.5 MG tablet, Take 1 tablet (2.5 mg total) by mouth 2 (two) times daily., Disp: 180 tablet, Rfl: 3   bisacodyl  (DULCOLAX) 5 MG EC tablet, Take 5 mg by mouth daily as needed for moderate constipation., Disp: , Rfl:    carvedilol  (COREG ) 6.25 MG tablet, TAKE 0.5 TABLETS (3.125 MG TOTAL) BY MOUTH 2 (TWO) TIMES DAILY WITH A MEAL., Disp: 90 tablet, Rfl: 2   cyanocobalamin  (VITAMIN B12) 1000 MCG tablet, Take 1,000 mcg by mouth daily., Disp: , Rfl:    fenofibrate  (TRICOR ) 145 MG tablet, TAKE 1 TABLET (145 MG TOTAL) BY MOUTH DAILY. TAKE WITH A MEAL., Disp: 90 tablet, Rfl: 3   lisinopril  (ZESTRIL ) 40 MG tablet, TAKE 1 TABLET BY MOUTH EVERY DAY, Disp: 90 tablet, Rfl: 2   nitroGLYCERIN  (NITROSTAT ) 0.4 MG SL tablet, Place 1 tablet (0.4 mg total) under the tongue every 5 (five) minutes as needed for up to 3 doses for chest pain., Disp: 25 tablet, Rfl: 3   pravastatin  (PRAVACHOL ) 40 MG tablet, Take 1 tablet (40 mg total) by mouth every evening., Disp: 90 tablet, Rfl: 3   XARELTO  20 MG TABS tablet, TAKE 1 TABLET BY MOUTH DAILY WITH SUPPER., Disp: 90 tablet, Rfl: 3  Past Medical  History: Past Medical History:  Diagnosis Date   Back pain    CAD S/P percutaneous coronary angioplasty 09/13/2018   09/13/2018 - Cath & Staged DES PCI on 09/14/2018: DES PCI: Cx99%&55% - Resolute Onyx DES 3.5 x 30 (3.6 mm), p-mLAD (prox of D1 almost to D2) - Resolute Onyx DES 3.0 x 22 (3.3 mm); DFR on pRCA 70% - 0.99, Not significant -> Rec Med Rx.   DVT (deep venous thrombosis) (HCC)    Heart attack (HCC) 09/09/2018   Hypertension    Lupus anticoagulant syndrome (HCC)    NSTEMI (non-ST elevated myocardial infarction) (HCC) 09/12/2018   Initial presentation with chest pain was on 09/09/2018, recurrent pain on 1/26 2020 with positive troponins.  Cath showed three-vessel CAD -> declined CABG, opted for two-vessel DES PCI (LAD and CX, negative DFR RCA)   Pulmonary embolism (HCC)     Tobacco Use: Social History   Tobacco Use  Smoking Status Every Day   Current packs/day: 0.50   Average packs/day: 0.5 packs/day for 50.4 years (25.2 ttl pk-yrs)   Types: Cigarettes   Start date: 1975  Smokeless Tobacco Never  Tobacco Comments   Cut back to quarter pack a day    Labs: Review Flowsheet  More data exists      Latest Ref Rng & Units 06/03/2022 06/06/2022 02/16/2023 03/20/2023 01/01/2024  Labs for ITP Cardiac and Pulmonary Rehab  Cholestrol 100 - 199 mg/dL 782  -  248  177  188   LDL (calc) 0 - 99 mg/dL 865  - 784  96  94   HDL-C >39 mg/dL 26  - 38  31  43   Trlycerides 0 - 149 mg/dL 696  - 295  284  132   PH, Arterial 7.35 - 7.45 - 7.337  - - -  PCO2 arterial 32 - 48 mmHg - 42.9  - - -  Bicarbonate 20.0 - 28.0 mmol/L - 23.0  - - -  TCO2 22 - 32 mmol/L - 24  - - -  Acid-base deficit 0.0 - 2.0 mmol/L - 3.0  - - -  O2 Saturation % - 100  - - -    Capillary Blood Glucose: Lab Results  Component Value Date   GLUCAP 103 (H) 04/20/2012     Exercise Target Goals: Exercise Program Goal: Individual exercise prescription set with THRR, safety & activity barriers. Participant demonstrates  ability to understand and report RPE using RPE 6-20 scale, to self-measure pulse accurately, and to acknowledge the importance of the exercise prescription.  Exercise Prescription Goal: Use initial exercise assessment to set exercise prescription to improve time and distance to onset of claudication pain. Provide education to aid in steps toward risk factor modification, improve quality of life with claudication, and utilize THRR and exercise prescription for best results for decreasing symptoms of PAD. Prevent injury to bone, joints, and skin integrity during the exercise through checks of each system during session check in. Following physician guidelines for any contraindications to specific exercises.  Activity Barriers:  Activity Barriers & Cardiac Risk Stratification - 01/26/24 1216       Activity Barriers & Cardiac Risk Stratification   Activity Barriers None;Other (comment)    Comments PAD    Cardiac Risk Stratification High             Gardner Treadmill Test Assessment:  Treadmill Test     Row Name 01/26/24 1206       Treadmill Test   Phase Pre    Speed if <2.0 mph 1.7 mph    Time to Claudication Onset 2.35 minutes    Total Time to 3-4/5 Claudication Pain 3.2 minutes      Heart Rate   Rest 85 bpm    0% Grade 99 bpm    2% Grade 106 bpm      Blood Pressure   Rest 138/68    2% Grade 142/66    2 Min Post 96/58      Claudication Score   Rest 1    0% 1    2% Grade 2      RPE   0% 7    2% Grade 11      Time in Stage   0% 2 minutes    2% Grade 1.2 minutes             Walking Impairment Questionnaire:  Walking Impairment Questionnaire - 01/26/24 1209       A. PAD Specific Questions   Leg Right    Pre Score 25    Pre % Score 625 %      B. Differential Diagnosis   Pre Score 16      Distance   Pre Distance Total Score 1.49    Pre Distance % Score 0.01 %      Speed   Pre Speed Total Score 43.48    Pre Speed % Score 94.52 %      Stairs  Pre Stairs Total Score 100    Pre Stairs % Score 34.72 %      Distance, Speed, and Stairs   Pre Distance, Speed, and Stairs Total Score 144.97    Pre Distance, Speed, and Stairs % Score Mean 43.08 %             Expectation is that scores will be within the SD range. Distance Pre % Score Mean: 38% (SD 12% - 0.64%)   Post % Score Mean: 55% (SD 26% - 84%)   Mean % Change: 18% (SD (-10%) - (46%))  Speed Pre % Score Mean: 41% (SD 19% - 63%)   Post % Score Mean: 52% (SD 30% - 74%)   Mean % Change: 11% (SD (-9%) - (31%))  Stairs Pre % Score Mean: 55% (SD 23% - 87%)   Post % Score Mean: 68% (SD 39% - 97%)   Mean % Change: 14% (SD (-15%) - (43%))  Total Score Pre % Score Mean: 45% (SD 23% - 67%   Post % Score Mean: 58% (SD 36% - 80%)   Mean % Change: 14% (SD (-5%) - (43%))     Initial Exercise Prescription:  Initial Exercise Prescription - 01/26/24 1200       Date of Initial Exercise RX and Referring Provider   Date 01/26/24    Referring Provider Dr. Noel Bathe    Expected Discharge Date 04/18/24      Treadmill   MPH 1.7    Grade 2    Minutes 25    METs 2.8      Prescription Details   Frequency (times per week) 3 days/week      Intensity   THRR 40-80% of Max Heartrate 61-122    Ratings of Perceived Exertion 11-13    Perceived Dyspnea 0-4      Progression   Progression Continue to follow PAD protocol      Resistance Training   Training Prescription Yes    Weight 3    Reps 10-15             Perform Capillary Blood Glucose checks as needed.  Exercise Prescription Changes:   Exercise Comments:   Exercise Comments     Row Name 01/26/24 1220           Exercise Comments Pt SET orenitation. pt tolerated TM test well w/o unusual signs and symptoms. Pt plans to return tomorrow for first day of exercise.                Exercise Goals and Review:   Exercise Goals     Row Name 01/26/24 1219             Exercise Goals   Increase Physical  Activity Yes       Intervention Provide advice, education, support and counseling about physical activity/exercise needs.;Develop an individualized exercise prescription for aerobic and resistive training based on initial evaluation findings, risk stratification, comorbidities and participant's personal goals.       Expected Outcomes Short Term: Attend rehab on a regular basis to increase amount of physical activity.;Long Term: Add in home exercise to make exercise part of routine and to increase amount of physical activity.;Long Term: Exercising regularly at least 3-5 days a week.       Increase Strength and Stamina Yes       Intervention Provide advice, education, support and counseling about physical activity/exercise needs.;Develop an individualized exercise prescription for aerobic and resistive training based on initial  evaluation findings, risk stratification, comorbidities and participant's personal goals.       Expected Outcomes Short Term: Increase workloads from initial exercise prescription for resistance, speed, and METs.;Long Term: Improve cardiorespiratory fitness, muscular endurance and strength as measured by increased METs and functional capacity ( );Short Term: Perform resistance training exercises routinely during rehab and add in resistance training at home       Able to understand and use rate of perceived exertion (RPE) scale Yes       Intervention Provide education and explanation on how to use RPE scale       Expected Outcomes Short Term: Able to use RPE daily in rehab to express subjective intensity level;Long Term:  Able to use RPE to guide intensity level when exercising independently       Knowledge and understanding of Target Heart Rate Range (THRR) Yes       Intervention Provide education and explanation of THRR including how the numbers were predicted and where they are located for reference       Expected Outcomes Short Term: Able to state/look up THRR;Long Term: Able  to use THRR to govern intensity when exercising independently;Short Term: Able to use daily as guideline for intensity in rehab       Understanding of Exercise Prescription Yes       Intervention Provide education, explanation, and written materials on patient's individual exercise prescription       Expected Outcomes Short Term: Able to explain program exercise prescription;Long Term: Able to explain home exercise prescription to exercise independently       Improve claudication pain toleration; Improve walking ability Yes       Intervention Participate in PAD/SET Rehab 2-3 days a week, walking at home as part of exercise prescription;Attend education sessions to aid in risk factor modification and understanding of disease process       Expected Outcomes Long Term: Improve walking ability and toleration to claudication;Long Term: Improve score of PAD questionnaires;Short Term: Improve walking distance/time to onset of claudication pain                Exercise Goals Re-Evaluation :    Discharge Exercise Prescription (Final Exercise Prescription Changes):   Nutrition:  Target Goals: Understanding of nutrition guidelines, daily intake of sodium 1500mg , cholesterol 200mg , calories 30% from fat and 7% or less from saturated fats, daily to have 5 or more servings of fruits and vegetables.  Biometrics:  Pre Biometrics - 01/26/24 1032       Pre Biometrics   Waist Circumference 37.5 inches    Hip Circumference 38.5 inches    Waist to Hip Ratio 0.97 %    Triceps Skinfold 19 mm    % Body Fat 35.7 %    Grip Strength 14 kg    Flexibility 9.5 in    Single Leg Stand 3.68 seconds               Psychosocial: Target Goals: Acknowledge presence or absence of significant depression and/or stress, maximize coping skills, provide positive support system. Participant is able to verbalize types and ability to use techniques and skills needed for reducing stress and depression.  Initial  Review & Psychosocial Screening:   Quality of Life Scores:  Quality of Life - 01/26/24 1204       Quality of Life   Select PAD/SET Quality of Life      PAD/SET Quality of Life Scores   Social Relationships and Interactions Pre 33  Self-Concepts and Feelings Pre 24    Symptoms and Limitations Pre 17    Fear and Uncertainty Pre 12    Positive Adaptation Pre 34    Job Pre 4    Sex Pre 6    Intimate Relationships Pre 6             Scores should be at or above lower SD. Change of 5 points or better indicates improvement in first 5 Factors. Maximum score for last three Factors is 6.  Score Interpretation Lower SD  Social Relationships and Interactions 23.99  Self-Concept and Feelings 17.79  Symptoms and Limitations 11.75  Fear and Uncertainty 8.96  Positive Adaptation 22.29  Job 2.08  Sexual Function 1.43  Intimate Mean 2.08      Education: Education Goals: Education classes will be provided on a variety of topics geared toward better understanding of heart and vascular health and risk factor modification. Participant will state understanding/return demonstration of topics presented as noted by education test scores.  Learning Barriers/Preferences:  Learning Barriers/Preferences - 01/26/24 1228       Learning Barriers/Preferences   Learning Barriers None    Learning Preferences Individual Instruction;Group Instruction;Skilled Demonstration             Education Topics: Count Your Pulse:  -Group instruction provided by verbal instruction, demonstration, patient participation and written materials to support subject.  Instructors address importance of being able to find your pulse and how to count your pulse when at home without a heart monitor.  Patients get hands on experience counting their pulse with staff help and individually.    Functional Fitness:  -Group instruction provided by verbal instruction, demonstration, patient participation, and written  materials to support subject.  Instructors address safety measures for doing things around the house.  Discuss how to get up and down off the floor, how to pick things up properly, how to safely get out of a chair without assistance, and balance training.   Meditation and Mindfulness:  -Group instruction provided by verbal instruction, patient participation, and written materials to support subject.  Instructor addresses importance of mindfulness and meditation practice to help reduce stress and improve awareness.  Instructor also leads participants through a meditation exercise.    Stretching for Flexibility and Mobility:  -Group instruction provided by verbal instruction, patient participation, and written materials to support subject.  Instructors lead participants through series of stretches that are designed to increase flexibility thus improving mobility.  These stretches are additional exercise for major muscle groups that are typically performed during regular warm up and cool down.     Hypertension: -Group verbal and written instruction that provides a basic overview of hypertension including the most recent diagnostic guidelines, risk factor reduction with self-care instructions and medication management.    Exercising on Your Own:  -Group instruction provided by verbal instruction, power point, and written materials to support subject.  Instructors discuss benefits of exercise, components of exercise, frequency and intensity of exercise, and end points for exercise.  Also discuss use of nitroglycerin  and activating EMS.  Review options of places to exercise outside of rehab.  Review guidelines for sex with heart disease.    Anatomy and Physiology of the Circulatory System:  Group verbal and written instruction and models provide basic cardiac anatomy and physiology, with the coronary electrical and arterial systems. Review of: AMI, Angina, Valve disease, Heart Failure, Peripheral  Artery Disease, Cardiac Arrhythmia, Pacemakers, and the ICD.   Other Education:  -Group or  individual verbal, written, or video instructions that support the educational goals of the cardiac rehab program.   Knowledge Questionnaire Score:   Core Components/Risk Factors/Patient Goals at Admission:  Personal Goals and Risk Factors at Admission - 01/26/24 1227       Core Components/Risk Factors/Patient Goals on Admission    Weight Management Weight Loss    Tobacco Cessation Yes    Intervention Assist the participant in steps to quit. Provide individualized education and counseling about committing to Tobacco Cessation, relapse prevention, and pharmacological support that can be provided by physician.;Education officer, environmental, assist with locating and accessing local/national Quit Smoking programs, and support quit date choice.    Expected Outcomes Short Term: Will demonstrate readiness to quit, by selecting a quit date.;Long Term: Complete abstinence from all tobacco products for at least 12 months from quit date.;Short Term: Will quit all tobacco product use, adhering to prevention of relapse plan.    Hypertension Yes    Intervention Provide education on lifestyle modifcations including regular physical activity/exercise, weight management, moderate sodium restriction and increased consumption of fresh fruit, vegetables, and low fat dairy, alcohol moderation, and smoking cessation.;Monitor prescription use compliance.    Expected Outcomes Short Term: Continued assessment and intervention until BP is < 140/72mm HG in hypertensive participants. < 130/9mm HG in hypertensive participants with diabetes, heart failure or chronic kidney disease.;Long Term: Maintenance of blood pressure at goal levels.    Lipids Yes    Intervention Provide education and support for participant on nutrition & aerobic/resistive exercise along with prescribed medications to achieve LDL 70mg , HDL >40mg .    Expected  Outcomes Short Term: Participant states understanding of desired cholesterol values and is compliant with medications prescribed. Participant is following exercise prescription and nutrition guidelines.;Long Term: Cholesterol controlled with medications as prescribed, with individualized exercise RX and with personalized nutrition plan. Value goals: LDL < 70mg , HDL > 40 mg.             Core Components/Risk Factors/Patient Goals Review:    Core Components/Risk Factors/Patient Goals at Discharge (Final Review):    ITP Comments:  ITP Comments     Row Name 01/26/24 1153           ITP Comments Dr. Berry Bristol Referring/Supervising SET physician.                Comments: Pt completed her SET orientation visit on 01/26/24. Pt exercised on the TM w/o unexpected symptoms. Pt did have mild claudication that resolved with rest.  Pt primary goal is to improve her claudication symptoms with exercise. Will see Ms Sporer again on Wednesday.   Barkley Li MS, ACSM-CEP 01/26/2024 12:38 PM  [id number]

## 2024-01-27 ENCOUNTER — Encounter (HOSPITAL_COMMUNITY)
Admission: RE | Admit: 2024-01-27 | Discharge: 2024-01-27 | Disposition: A | Discharge status: Home / Self Care | Source: Ambulatory Visit | Attending: Cardiology | Admitting: Cardiology

## 2024-01-27 DIAGNOSIS — I70211 Atherosclerosis of native arteries of extremities with intermittent claudication, right leg: Secondary | ICD-10-CM

## 2024-01-27 NOTE — Progress Notes (Signed)
 Daily Session Note  Patient Details  Name: [name] [name] MRN: [id number] Date of Birth: [birth year] Referring Provider:   Gattis Kass Supervised Exercise Therapy from 01/26/2024 in [facility] for Heart, Vascular, & Lung Health  Referring Provider Dr. Noel Bathe       Encounter Date: 01/27/2024  Check In:  Session Check In - 01/27/24 1035       Check-In   Supervising physician immediately available to respond to emergencies [facility] MD immediately available    Physician(s) Slater Duncan, NP    Location [facility]    Staff Present Rosezena Contes, MS, ACSM-CEP, CCRP, Exercise Physiologist;Kaylee Nolon Baxter, MS, ACSM-CEP, Exercise Physiologist;Maria Whitaker, RN, BSN;Jetta Walker BS, ACSM-CEP, Exercise Physiologist;Olinty Gaylene Kays, MS, ACSM-CEP, Exercise Physiologist;Bailey Martina Sledge, MS, Exercise Physiologist    Virtual Visit No    Medication changes reported     No    Fall or balance concerns reported    No    Tobacco Cessation No Change   Pts smokes two cigarettes/day   Warm-up and Cool-down Performed on first and last piece of equipment    Resistance Training Performed No    VAD Patient? No    PAD/SET Patient? Yes      PAD/SET Patient   Completed foot check today? Yes    Open wounds to report? Yes      Pain Assessment   Currently in Pain? No/denies    Pain Score 0-No pain    Multiple Pain Sites No             Capillary Blood Glucose: No results found for this or any previous visit (from the past 24 hours).   Exercise Prescription Changes - 01/27/24 1200       Response to Exercise   Blood Pressure (Admit) 140/62    Blood Pressure (Exercise) 130/62    Blood Pressure (Exit) 112/52    Heart Rate (Admit) 80 bpm    Heart Rate (Exercise) 103 bpm    Heart Rate (Exit) 85 bpm    Oxygen Saturation (Admit) 100 %    Oxygen Saturation (Exercise) 100 %    Oxygen Saturation (Exit) 100 %    Rating of Perceived Exertion (Exercise) 12     Perceived Dyspnea (Exercise) 0    Symptoms Moderate claudication pain on TM    Comments Pt first day in program    Duration Progress to 10 minutes continuous walking  at current work load and total walking time to 30-45 min    Intensity THRR unchanged      Progression   Progression Continue to follow PAD protocol    Average METs 2.8      Resistance Training   Training Prescription --   No weights on Wednesday     Treadmill   MPH 1.7    Grade 2    Minutes 22   22-23 minutes of walking time on TM. 8 minute warm-up on TM 1.3/1.0   METs 2.8             Social History   Tobacco Use  Smoking Status Every Day   Current packs/day: 0.50   Average packs/day: 0.5 packs/day for 50.4 years (25.2 ttl pk-yrs)   Types: Cigarettes   Start date: 1975  Smokeless Tobacco Never  Tobacco Comments   Cut back to quarter pack a day    Goals Met:  Exercise tolerated well No report of concerns or symptoms today  Goals Unmet:  N/A at this time  as this is pts first day.   Comments: Pt was cleared for supervised exercise therapy by Dr. Chuck Crater Due to patients hx of CV disease pt is being watched closely and monitored for any unusual signs/symptoms while exercising. Will continue to watch closely, monitor, and report any unusual or unexpected pain through the program.  Pt exercised on the Treadmill  for 22 minutes and experienced 4/5 claudication pain that resolved with sitting rest breaks. Pt tolerated exercise w/o unusual and unexpected symptoms. Will continue to monitor and progress through the program following PAD protocol.    Initial onset of claudication at: 3:50 Longest exercised time before break: 6:03 Total exercise time/rest time: 22/17  Service time is from 1000 to 1130.

## 2024-01-28 ENCOUNTER — Encounter (HOSPITAL_COMMUNITY)

## 2024-01-29 ENCOUNTER — Encounter (HOSPITAL_COMMUNITY)

## 2024-02-01 ENCOUNTER — Encounter (HOSPITAL_COMMUNITY)
Admission: RE | Admit: 2024-02-01 | Discharge: 2024-02-01 | Disposition: A | Discharge status: Home / Self Care | Source: Ambulatory Visit | Attending: Cardiology

## 2024-02-01 DIAGNOSIS — I70211 Atherosclerosis of native arteries of extremities with intermittent claudication, right leg: Secondary | ICD-10-CM | POA: Diagnosis not present

## 2024-02-01 NOTE — Progress Notes (Signed)
 Daily Session Note  Patient Details  Name: [name] [name] MRN: [id number] Date of Birth: 16-Jul-1956 Referring Provider:   Gattis Kass Supervised Exercise Therapy from 01/26/2024 in [facility]  Referring Provider Dr. Noel Bathe    Encounter Date: 02/01/2024  Check In:  Session Check In - 02/01/24 1131       Check-In   Supervising physician immediately available to respond to emergencies [facility] MD immediately available    Physician(s) Morey Ar, NP    Location [facility]    Staff Present Joann Mu, RN, BSN;Ellwood Steidle, MS, Exercise Physiologist    Virtual Visit No    Medication changes reported     No    Fall or balance concerns reported    No    Tobacco Cessation No Change   Pts smokes two cigarettes/day   Warm-up and Cool-down Performed on first and last piece of equipment    Resistance Training Performed No    VAD Patient? No    PAD/SET Patient? Yes      PAD/SET Patient   Completed foot check today? Yes    Open wounds to report? Yes      Pain Assessment   Currently in Pain? No/denies    Pain Score 0-No pain    Multiple Pain Sites No          Capillary Blood Glucose: No results found for this or any previous visit (from the past 24 hours).    Social History   Tobacco Use  Smoking Status Every Day   Current packs/day: 0.50   Average packs/day: 0.5 packs/day for 50.5 years (25.2 ttl pk-yrs)   Types: Cigarettes   Start date: 1975  Smokeless Tobacco Never  Tobacco Comments   Cut back to quarter pack a day    Goals Met:  Exercise tolerated well No report of concerns or symptoms today Strength training completed today  Goals Unmet:  Still progressing towards 8-10 minutes of continuous walking w/o stopping.   Comments: Pt was cleared for supervised exercise therapy by Dr. Chuck Crater Due to patients hx of CV disease pt is being watched closely and monitored for  any unusual signs/symptoms while exercising. Will continue to watch closely, monitor, and report any unusual or unexpected pain through the program.  Pt exercised on the Treadmill  for 21 minutes and experienced 4/5 claudication pain that resolved with sitting rest breaks. Pt tolerated exercise w/o unusual and unexpected symptoms. Will continue to monitor and progress through the program following PAD protocol.    Initial onset of claudication at: 3:30 Longest exercised time before break: 6:01 Total exercise time/rest time: 21/20  Service time is from 1030 to 1145.    Dr. Gaylyn Keas is Medical Director for Cardiac Rehab at [facility].

## 2024-02-02 ENCOUNTER — Ambulatory Visit: Attending: Cardiovascular Disease | Admitting: Cardiovascular Disease

## 2024-02-02 ENCOUNTER — Encounter: Payer: Self-pay | Admitting: Cardiovascular Disease

## 2024-02-02 VITALS — BP 148/76 | HR 84 | Ht 64.0 in | Wt 137.0 lb

## 2024-02-02 DIAGNOSIS — I739 Peripheral vascular disease, unspecified: Secondary | ICD-10-CM | POA: Diagnosis not present

## 2024-02-02 DIAGNOSIS — Z720 Tobacco use: Secondary | ICD-10-CM | POA: Diagnosis not present

## 2024-02-02 MED ORDER — CILOSTAZOL 50 MG PO TABS: 50.0000 mg | ORAL_TABLET | Freq: Two times a day (BID) | ORAL | 1 refills | Status: DC

## 2024-02-02 NOTE — Progress Notes (Signed)
 02/02/2024 [name] [name]   1956/08/15  [id number]  Primary Physician Viola Greulich, MD Primary Cardiologist: Avanell Leigh MD [name], [place], [place]  HPI:  [name] [name] is a 68 y.o.  mild to moderately overweight married Caucasian female mother of 1, grandmother of 2 grandchildren referred by Ervin Heath, PA-C for peripheral vascular valuation.  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  I last saw her in the office 01/06/2024.  Her risk factors include ongoing tobacco abuse 1/2 pack/day, 11/17/3.  She is accompanied by her husband [name] [name] today.  Hypertension and hyperlipidemia.  She does have CAD status post LAD and circumflex intervention back in January 2020.  She is never had a stroke.  She does have lupus anticoagulant with extensive lower extremity DVT and pulmonary emboli status post endovascular therapy with stenting of her lower extremity venous vasculature as well as placement of an IVC filter.  She was on Coumadin  for some time and transition to Xarelto .  When she was on triple therapy for her LAD and circumflex stent she had GI bleeding.  Aspirin  was discontinued.  She was not tolerant to Plavix .  She does complain of bilateral calf claudication over the last year with Dopplers performed 02/27/2022 revealing a right ABI of 0.57, left of 0.52.  She had high-grade lesions in her distal right SFA and popliteal artery and an occluded left SFA.   I performed peripheral angiography intervention 06/02/2022.  I performed [facility] 1 directional arthrectomy followed by [facility] of 8 short segment CTO in the mid right SFA.  She also had a mid left SFA CTO.  She was discharged home the following day and was readmitted that Friday with pain in her left groin, CT showed hematoma, Doppler showed pseudoaneurysm.  She underwent surgical evacuation and correction of the pseudoaneurysm by Dr. Christia Cowboy.  She just saw Dr. Christia Cowboy in the office last week who  noted that the incision was dry with some surrounding edema.  She does have some paresthesias down the medial aspect of her left thigh.  Her claudication has resolved and her right SFA remained widely patent by duplex ultrasound 06/12/2022.   When I saw her on July 5 of this year she was complaining of recurrent claudication over the last 3 to 4 weeks with Doppler study performed 02/16/2023 revealed a decline in her right ABI to 0.48 with an occlusion of her right popliteal artery.  Our plans are to proceed with angiography and reintervention.  The major issue is going to be Lovenox  bridging and careful attention to her left groin given her previous pseudoaneurysm formation.   I performed peripheral angiography on her 03/19/2023 revealing a distal right SFA CTO.  I performed PTA and stenting using a SUPERA self-expanding stent.  She was discharged home 2 days later.  Her groin is remained stable.  Follow-up Doppler studies performed 03/30/2023 revealed marked improvement in her ABI on the right up to 1.06 with a widely patent stent.  Her claudication has resolved.   Since I saw her 3 months ago she has had recurrent symptoms with tingling numbness and pain in her right calf.  She had Dopplers performed 01/01/2024 which revealed a decline in her right ABI from 1.06 down to 0.44.  Her right SFA stent has occluded.  She is clinically symptomatic.  I asked Dr. Gursimran Litaker Bristol to perform angiography which was performed 01/12/2024 revealing an occluded right SFA stent consistent with  her Doppler studies.  She is currently involved in vascular rehab and walks 3 days a week.  She does complain of lifestyle-limiting claudication.  I do not think she has good percutaneous options for revascularization.  If she does not improve with exercise therapy customize Pletal I am going to refer her back to Dr. Rosalva Comber who has seen her in the past for consideration of failed with any follow-up expected.  She did have a laparoscopic  cholecystectomy on 12/07/23.  Current Meds  Medication Sig   acetaminophen  (TYLENOL ) 650 MG CR tablet Take 650 mg by mouth every 8 (eight) hours as needed for pain.   amLODipine  (NORVASC ) 2.5 MG tablet Take 1 tablet (2.5 mg total) by mouth 2 (two) times daily.   bisacodyl  (DULCOLAX) 5 MG EC tablet Take 5 mg by mouth daily as needed for moderate constipation.   carvedilol  (COREG ) 6.25 MG tablet TAKE 0.5 TABLETS (3.125 MG TOTAL) BY MOUTH 2 (TWO) TIMES DAILY WITH A MEAL.   cilostazol (PLETAL) 50 MG tablet Take 1 tablet (50 mg total) by mouth 2 (two) times daily.   cyanocobalamin  (VITAMIN B12) 1000 MCG tablet Take 1,000 mcg by mouth daily.   fenofibrate  (TRICOR ) 145 MG tablet TAKE 1 TABLET (145 MG TOTAL) BY MOUTH DAILY. TAKE WITH A MEAL.   lisinopril  (ZESTRIL ) 40 MG tablet TAKE 1 TABLET BY MOUTH EVERY DAY   nitroGLYCERIN  (NITROSTAT ) 0.4 MG SL tablet Place 1 tablet (0.4 mg total) under the tongue every 5 (five) minutes as needed for up to 3 doses for chest pain.   pravastatin  (PRAVACHOL ) 40 MG tablet Take 1 tablet (40 mg total) by mouth every evening.   XARELTO  20 MG TABS tablet TAKE 1 TABLET BY MOUTH DAILY WITH SUPPER.     Allergies  Allergen Reactions   Prednisone Other (See Comments)    No energy stalls out.    Makes pt  Jittery.  **makes pt have no energy**   Tramadol Other (See Comments) and Nausea Only    Constipation,  Makes pt  Jittery.  constipation   Crestor  [Rosuvastatin  Calcium ]     Feels fatigued, RE  CHALLENGE - UNABLE TO USE 12/28/18   Hctz [Hydrochlorothiazide ]     Dizzy    Repatha  [Evolocumab ] Other (See Comments)    Per patient never took this medication   Zithromax [Azithromycin] Other (See Comments)    jittery    Social History   Socioeconomic History   Marital status: Married    Spouse name: Not on file   Number of children: 2   Years of education: Not on file   Highest education level: Bachelor's degree (e.g., BA, AB, BS)  Occupational History    Occupation: SERVICE MANAGER    Employer: [employer]  Tobacco Use   Smoking status: Every Day    Current packs/day: 0.50    Average packs/day: 0.5 packs/day for 50.5 years (25.2 ttl pk-yrs)    Types: Cigarettes    Start date: 1975   Smokeless tobacco: Never   Tobacco comments:    Cut back to quarter pack a day  Vaping Use   Vaping status: Never Used  Substance and Sexual Activity   Alcohol use: Yes    Comment: Occasional   Drug use: No   Sexual activity: Not on file  Other Topics Concern   Not on file  Social History Narrative   Really is a married mother of 2.  1 son died from complications of spina bifida after multiple surgeries.  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     She has a has a Scientist, water quality in education.     Drinks social alcohol & denies drug use.     Pt endorses smoking maybe 2 cigarettes/day.  States she placed most of the cigarette, may have 2 puffs.   Social Drivers of Corporate investment banker Strain: Low Risk  (12/28/2023)   Overall Financial Resource Strain (CARDIA)    Difficulty of Paying Living Expenses: Not hard at all  Food Insecurity: No Food Insecurity (12/28/2023)   Hunger Vital Sign    Worried About Running Out of Food in the Last Year: Never true    Ran Out of Food in the Last Year: Never true  Transportation Needs: No Transportation Needs (12/28/2023)   PRAPARE - Administrator, Civil Service (Medical): No    Lack of Transportation (Non-Medical): No  Physical Activity: Insufficiently Active (12/28/2023)   Exercise Vital Sign    Days of Exercise per Week: 2 days    Minutes of Exercise per Session: 20 min  Stress: No Stress Concern Present (12/28/2023)   [employer] of Occupational Health - Occupational Stress Questionnaire    Feeling of Stress : Not at all  Social Connections: Socially Integrated (12/28/2023)   Social Connection and Isolation Panel    Frequency of Communication with  Friends and Family: More than three times a week    Frequency of Social Gatherings with Friends and Family: Three times a week    Attends Religious Services: More than 4 times per year    Active Member of Clubs or Organizations: Yes    Attends Banker Meetings: 1 to 4 times per year    Marital Status: Married  Catering manager Violence: Not At Risk (10/27/2022)   Humiliation, Afraid, Rape, and Kick questionnaire    Fear of Current or Ex-Partner: No    Emotionally Abused: No    Physically Abused: No    Sexually Abused: No     Review of Systems: General: negative for chills, fever, night sweats or weight changes.  Cardiovascular: negative for chest pain, dyspnea on exertion, edema, orthopnea, palpitations, paroxysmal nocturnal dyspnea or shortness of breath Dermatological: negative for rash Respiratory: negative for cough or wheezing Urologic: negative for hematuria Abdominal: negative for nausea, vomiting, diarrhea, bright red blood per rectum, melena, or hematemesis Neurologic: negative for visual changes, syncope, or dizziness All other systems reviewed and are otherwise negative except as noted above.    Blood pressure (!) 148/76, pulse 84, height 5' 4 (1.626 m), weight 137 lb (62.1 kg), SpO2 98%.  General appearance: alert and no distress Neck: no adenopathy, no carotid bruit, no JVD, supple, symmetrical, trachea midline, and thyroid  not enlarged, symmetric, no tenderness/mass/nodules Lungs: clear to auscultation bilaterally Heart: regular rate and rhythm, S1, S2 normal, no murmur, click, rub or gallop Extremities: extremities normal, atraumatic, no cyanosis or edema Pulses: Absent pedal pulses Skin: Skin color, texture, turgor normal. No rashes or lesions Neurologic: Grossly normal  EKG not performed today      ASSESSMENT AND PLAN:   Tobacco use Ongoing tobacco use recalcitrant to risk factor modification.  PAD (peripheral artery disease) (HCC) History of  PAD status post peripheral angiography 06/02/2022 which noted an occluded SFAs bilaterally with two-vessel runoff on the right and 3 on the left.  She underwent directional atherectomy followed by [facility] with an excellent result.  She returned with recurrent symptoms  and had reangiography by myself 03/19/2023 revealing an occluded mid to distal right SFA.  She underwent intervention with ultimate placement of a superior 5 mm x 120 mm long self-expanding stent.  She did have three-vessel runoff.  Unfortunately, because of recurrent symptoms she had Doppler study performed 01/01/2024 that showed a decline in her right ABI from 1.06 down to 0.44.  She was symptomatic.  I asked Dr. Alzena Gerber Bristol to perform peripheral angiography which he did on 01/12/2024 that demonstrated an occluded distal right SFA stent reconstituting in the below the knee popliteal artery.  She is currently participating in vascular rehab.  It is unclear whether this is related to fibrointimal hyperplasia plus or minus a thrombotic component.  Dr. Selyna Klahn Bristol did not mention laser angioplasty which I do not think would be a long-term solution.  I am beginning her on Pletal 50 mg p.o. twice daily I will see her back in 3 months.  If she does not have a significant amount of clinical improvement I will send her back to Dr. Rosalva Comber who has seen her before (pain and vascular specialist) to discuss possible and below the knee prosthetic extremity.     Avanell Leigh MD FACP,FACC,FAHA, [facility] 02/02/2024 9:01 AM

## 2024-02-02 NOTE — Patient Instructions (Signed)
 Medication Instructions:  Your physician has recommended you make the following change in your medication:   -Start cilostazol (pletal) 50mg  twice daily.  *If you need a refill on your cardiac medications before your next appointment, please call your pharmacy*  Follow-Up: At [facility], you and your health needs are our priority.  As part of our continuing mission to provide you with exceptional heart care, our providers are all part of one team.  This team includes your primary Cardiologist (physician) and Advanced Practice Providers or APPs (Physician Assistants and Nurse Practitioners) who all work together to provide you with the care you need, when you need it.  Your next appointment:   3 month(s)  Provider:   Lauro Portal, MD    We recommend signing up for the patient portal called "MyChart".  Sign up information is provided on this After Visit Summary.  MyChart is used to connect with patients for Virtual Visits (Telemedicine).  Patients are able to view lab/test results, encounter notes, upcoming appointments, etc.  Non-urgent messages can be sent to your provider as well.   To learn more about what you can do with MyChart, go to [email or web address].

## 2024-02-02 NOTE — Assessment & Plan Note (Signed)
 History of PAD status post peripheral angiography 06/02/2022 which noted an occluded SFAs bilaterally with two-vessel runoff on the right and 3 on the left.  She underwent directional atherectomy followed by [facility] with an excellent result.  She returned with recurrent symptoms and had reangiography by myself 03/19/2023 revealing an occluded mid to distal right SFA.  She underwent intervention with ultimate placement of a superior 5 mm x 120 mm long self-expanding stent.  She did have three-vessel runoff.  Unfortunately, because of recurrent symptoms she had Doppler study performed 01/01/2024 that showed a decline in her right ABI from 1.06 down to 0.44.  She was symptomatic.  I asked Dr. Crista Nuon Bristol to perform peripheral angiography which he did on 01/12/2024 that demonstrated an occluded distal right SFA stent reconstituting in the below the knee popliteal artery.  She is currently participating in vascular rehab.  It is unclear whether this is related to fibrointimal hyperplasia plus or minus a thrombotic component.  Dr. Griffin Dewilde Bristol did not mention laser angioplasty which I do not think would be a long-term solution.  I am beginning her on Pletal 50 mg p.o. twice daily I will see her back in 3 months.  If she does not have a significant amount of clinical improvement I will send her back to Dr. Rosalva Comber who has seen her before (pain and vascular specialist) to discuss possible and below the knee prosthetic extremity.

## 2024-02-02 NOTE — Assessment & Plan Note (Signed)
 Ongoing tobacco use recalcitrant to risk factor modification.

## 2024-02-03 ENCOUNTER — Encounter (HOSPITAL_COMMUNITY)
Admission: RE | Admit: 2024-02-03 | Discharge: 2024-02-03 | Disposition: A | Discharge status: Home / Self Care | Source: Ambulatory Visit | Attending: Cardiology

## 2024-02-03 DIAGNOSIS — I70211 Atherosclerosis of native arteries of extremities with intermittent claudication, right leg: Secondary | ICD-10-CM | POA: Diagnosis not present

## 2024-02-03 NOTE — Progress Notes (Signed)
 Daily Session Note  Patient Details  Name: [name] [name] MRN: [id number] Date of Birth: 06/01/1956 Referring Provider:   Gattis Kass Supervised Exercise Therapy from 01/26/2024 in [facility]  Referring Provider Dr. Noel Bathe    Encounter Date: 02/03/2024  Check In:  Session Check In - 02/03/24 1043       Check-In   Supervising physician immediately available to respond to emergencies [facility] MD immediately available    Physician(s) Morey Ar, NP    Location [facility]    Staff Present Joann Mu, RN, BSN;Tenlee Wollin, MS, Exercise Physiologist    Virtual Visit No    Medication changes reported     No    Fall or balance concerns reported    No    Tobacco Cessation No Change   Pts smokes two cigarettes/day   Warm-up and Cool-down Performed on first and last piece of equipment    Resistance Training Performed No    VAD Patient? No    PAD/SET Patient? Yes      PAD/SET Patient   Completed foot check today? Yes    Open wounds to report? Yes      Pain Assessment   Currently in Pain? No/denies    Pain Score 0-No pain    Multiple Pain Sites No          Capillary Blood Glucose: No results found for this or any previous visit (from the past 24 hours).    Social History   Tobacco Use  Smoking Status Every Day   Current packs/day: 0.50   Average packs/day: 0.5 packs/day for 50.5 years (25.2 ttl pk-yrs)   Types: Cigarettes   Start date: 1975  Smokeless Tobacco Never  Tobacco Comments   Cut back to quarter pack a day    Goals Met:  Exercise tolerated well No report of concerns or symptoms today  Goals Unmet:  Pt unable to walk for 8 minutes at current ExRX w/o stopping.  Comments: Pt was cleared for supervised exercise therapy by Dr. Chuck Crater Due to patients hx of CV disease pt is being watched closely and monitored for any unusual signs/symptoms while exercising. Will  continue to watch closely, monitor, and report any unusual or unexpected pain through the program.  Pt exercised on the Treadmill  for 19 minutes and experienced 4/5 claudication pain that resolved with sitting rest breaks. Pt tolerated exercise w/o unusual and unexpected symptoms. Will continue to monitor and progress through the program following PAD protocol.    Initial onset of claudication at: 3:30 Longest exercised time before break:5:39 Total exercise time/rest time: 19/14  Service time is from 1030 to 1145.

## 2024-02-05 ENCOUNTER — Encounter (HOSPITAL_COMMUNITY)
Admission: RE | Admit: 2024-02-05 | Discharge: 2024-02-05 | Disposition: A | Discharge status: Home / Self Care | Source: Ambulatory Visit | Attending: Cardiology | Admitting: Cardiology

## 2024-02-05 DIAGNOSIS — I70211 Atherosclerosis of native arteries of extremities with intermittent claudication, right leg: Secondary | ICD-10-CM | POA: Diagnosis not present

## 2024-02-05 NOTE — Progress Notes (Signed)
 Daily Session Note  Patient Details  Name: [name] [name] MRN: [id number] Date of Birth: 1955-09-24 Referring Provider:   Gattis Kass Supervised Exercise Therapy from 01/26/2024 in [facility]  Referring Provider Dr. Noel Bathe    Encounter Date: 02/05/2024  Check In:  Session Check In - 02/05/24 1043       Check-In   Supervising physician immediately available to respond to emergencies [facility] MD immediately available    Physician(s) Lawana Pray, NP    Location [facility]    Staff Present Joann Mu, RN, BSN;Haset Oaxaca, MS, Exercise Physiologist    Virtual Visit No    Medication changes reported     No    Fall or balance concerns reported    No    Tobacco Cessation No Change   Pts smokes two cigarettes/day   Warm-up and Cool-down Performed on first and last piece of equipment    Resistance Training Performed Yes    VAD Patient? No    PAD/SET Patient? Yes      PAD/SET Patient   Completed foot check today? Yes    Open wounds to report? Yes      Pain Assessment   Currently in Pain? No/denies    Pain Score 0-No pain    Multiple Pain Sites No          Capillary Blood Glucose: No results found for this or any previous visit (from the past 24 hours).    Social History   Tobacco Use  Smoking Status Every Day   Current packs/day: 0.50   Average packs/day: 0.5 packs/day for 50.5 years (25.2 ttl pk-yrs)   Types: Cigarettes   Start date: 1975  Smokeless Tobacco Never  Tobacco Comments   Cut back to quarter pack a day    Goals Met:  Independence with exercise equipment Exercise tolerated well No report of concerns or symptoms today Strength training completed today  Goals Unmet:  Pt will continue progress in program.   Comments: Pt was cleared for supervised exercise therapy by Dr. Jagadeesh Gangi Due to patients hx of CV disease pt is being watched closely and monitored for any  unusual signs/symptoms while exercising. Will continue to watch closely, monitor, and report any unusual or unexpected pain through the program.  Pt exercised on the Treadmill  for 23 minutes and experienced 4/5 claudication pain that resolved with sitting rest breaks. Pt tolerated exercise w/o unusual and unexpected symptoms. Will continue to monitor and progress through the program following PAD protocol.    Initial onset of claudication at: 3:35 Longest exercised time before break:7:03 Total exercise time/rest time: 23/18  Service time is from 1030 to 1140.    Dr. Gaylyn Keas is Medical Director for Cardiac Rehab at [facility].

## 2024-02-08 ENCOUNTER — Encounter (HOSPITAL_COMMUNITY)
Admission: RE | Admit: 2024-02-08 | Discharge: 2024-02-08 | Disposition: A | Discharge status: Home / Self Care | Source: Ambulatory Visit | Attending: Cardiology

## 2024-02-08 DIAGNOSIS — I70211 Atherosclerosis of native arteries of extremities with intermittent claudication, right leg: Secondary | ICD-10-CM

## 2024-02-08 NOTE — Progress Notes (Signed)
 Daily Session Note  Patient Details  Name: [name] [name] MRN: [id number] Date of Birth: 1956/08/14 Referring Provider:   Conrad Ports Supervised Exercise Therapy from 01/26/2024 in [facility]  Referring Provider Dr. Gordy Schwalbe    Encounter Date: 02/08/2024  Check In:  Session Check In - 02/08/24 1253       Check-In   Supervising physician immediately available to respond to emergencies [facility] MD immediately available    Physician(s) Josefa Beauvais, NP    Location [facility]    Staff Present Hadassah Quan, RN, BSN;Jahred Tatar Fayette, MS, Exercise Physiologist;Casey Claudene Grayland Gal, MS, ACSM-CEP, Exercise Physiologist;David Makemson, MS, ACSM-CEP, CCRP, Exercise Physiologist    Virtual Visit No    Medication changes reported     No    Fall or balance concerns reported    No    Tobacco Cessation No Change   Pts smokes two cigarettes/day   Warm-up and Cool-down Performed on first and last piece of equipment    Resistance Training Performed Yes    VAD Patient? No    PAD/SET Patient? Yes      PAD/SET Patient   Completed foot check today? Yes    Open wounds to report? No      Pain Assessment   Currently in Pain? No/denies    Pain Score 0-No pain    Multiple Pain Sites No          Capillary Blood Glucose: No results found for this or any previous visit (from the past 24 hours).    Social History   Tobacco Use  Smoking Status Every Day   Current packs/day: 0.50   Average packs/day: 0.5 packs/day for 50.5 years (25.2 ttl pk-yrs)   Types: Cigarettes   Start date: 1975  Smokeless Tobacco Never  Tobacco Comments   Cut back to quarter pack a day    Goals Met:  Independence with exercise equipment Exercise tolerated well No report of concerns or symptoms today Strength training completed today  Goals Unmet:  Not Applicable  Comments: Pt was cleared for supervised exercise therapy by  Dr. Erick Schwalbe Due to patients hx of CV disease pt is being watched closely and monitored for any unusual signs/symptoms while exercising. Will continue to watch closely, monitor, and report any unusual or unexpected pain through the program.  Pt exercised on the Treadmill  for 27 minutes and experienced 3-4/5 claudication pain that resolved with sitting rest breaks. Pt tolerated exercise w/o unusual and unexpected symptoms. Will continue to monitor and progress through the program following PAD protocol.    Initial onset of claudication at: 3:50 Longest exercised time before break: 6:32 Total exercise time/rest time: 27/20  Service time is from 1030 to 1140.    Dr. Wilbert Bihari is Medical Director for Cardiac Rehab at [facility].

## 2024-02-10 ENCOUNTER — Encounter (HOSPITAL_COMMUNITY)
Admission: RE | Admit: 2024-02-10 | Discharge: 2024-02-10 | Disposition: A | Discharge status: Home / Self Care | Source: Ambulatory Visit | Attending: Cardiology | Admitting: Cardiology

## 2024-02-10 ENCOUNTER — Encounter: Payer: Self-pay | Admitting: Cardiovascular Disease

## 2024-02-10 VITALS — Wt 134.9 lb

## 2024-02-10 DIAGNOSIS — D509 Iron deficiency anemia, unspecified: Secondary | ICD-10-CM

## 2024-02-10 DIAGNOSIS — R195 Other fecal abnormalities: Secondary | ICD-10-CM

## 2024-02-10 DIAGNOSIS — Z7902 Long term (current) use of antithrombotics/antiplatelets: Secondary | ICD-10-CM

## 2024-02-10 DIAGNOSIS — I70211 Atherosclerosis of native arteries of extremities with intermittent claudication, right leg: Secondary | ICD-10-CM | POA: Diagnosis not present

## 2024-02-10 NOTE — Progress Notes (Signed)
 Daily Session Note  Patient Details  Name: [name] [name] MRN: [id number] Date of Birth: 1955-11-26 Referring Provider:   Conrad Ports Supervised Exercise Therapy from 01/26/2024 in [facility]  Referring Provider Dr. Gordy Schwalbe    Encounter Date: 02/10/2024  Check In:  Session Check In - 02/10/24 1006       Check-In   Supervising physician immediately available to respond to emergencies [facility] MD immediately available    Physician(s) Rosabel Mose, NP    Location [facility]    Staff Present Hadassah Quan, RN, BSN;Casey Smith, Grayland Gal, MS, ACSM-CEP, Exercise Physiologist;David Janann, MS, ACSM-CEP, CCRP, Exercise Physiologist;Franco Duley Nicholaus, MS, ACSM-CEP, Exercise Physiologist;Mary Harvy, RN, BSN    Virtual Visit No    Medication changes reported     No    Fall or balance concerns reported    No    Tobacco Cessation No Change   Pts smokes two cigarettes/day   Warm-up and Cool-down Performed on first and last piece of equipment    Resistance Training Performed Yes    VAD Patient? No    PAD/SET Patient? No      PAD/SET Patient   Completed foot check today? Yes    Open wounds to report? No      Pain Assessment   Currently in Pain? No/denies    Multiple Pain Sites No          Capillary Blood Glucose: No results found for this or any previous visit (from the past 24 hours).   Exercise Prescription Changes - 02/10/24 1100       Response to Exercise   Blood Pressure (Admit) 118/62    Blood Pressure (Exercise) 110/60    Blood Pressure (Exit) 92/52    Heart Rate (Admit) 90 bpm    Heart Rate (Exercise) 115 bpm    Heart Rate (Exit) 94 bpm    Oxygen Saturation (Admit) 99 %    Oxygen Saturation (Exercise) 98 %    Oxygen Saturation (Exit) 100 %    Rating of Perceived Exertion (Exercise) 14    Perceived Dyspnea (Exercise) 0    Symptoms Moderate claudication pain on TM    Duration Progress  to 10 minutes continuous walking  at current work load and total walking time to 30-45 min    Intensity THRR unchanged      Progression   Progression Continue to follow PAD protocol    Average METs 2.8      Resistance Training   Training Prescription --   No weights today     Treadmill   MPH 1.7    Grade 2    Minutes 27   5 min warmup included   METs 2.8          Social History   Tobacco Use  Smoking Status Every Day   Current packs/day: 0.50   Average packs/day: 0.5 packs/day for 50.5 years (25.2 ttl pk-yrs)   Types: Cigarettes   Start date: 1975  Smokeless Tobacco Never  Tobacco Comments   Cut back to quarter pack a day    Goals Met:  Independence with exercise equipment Exercise tolerated well No report of concerns or symptoms today Strength training completed today  Goals Unmet:  Not Applicable  Comments: Pt was cleared for supervised exercise therapy by Dr. Jagadeesh Gangi Due to patients hx of CV disease pt is being watched closely and monitored for any unusual signs/symptoms while exercising.  Will continue to watch closely, monitor, and report any unusual or unexpected pain through the program.   Pt exercised on the Treadmill  for 27 minutes and experienced 3-4/5 claudication pain that resolved with sitting rest breaks. Pt tolerated exercise w/o unusual and unexpected symptoms. Will continue to monitor and progress through the program following PAD protocol.    Initial onset of claudication at: 3:54 Longest exercised time before break: 6:00 Total exercise time/rest time: 27/25   Service time is from 1030 to 1140.  Dr. Wilbert Bihari is Medical Director for Cardiac Rehab at [facility].

## 2024-02-12 ENCOUNTER — Encounter (HOSPITAL_COMMUNITY)

## 2024-02-15 ENCOUNTER — Telehealth: Payer: Self-pay

## 2024-02-15 ENCOUNTER — Encounter (HOSPITAL_COMMUNITY)
Admission: RE | Admit: 2024-02-15 | Discharge: 2024-02-15 | Disposition: A | Discharge status: Home / Self Care | Source: Ambulatory Visit | Attending: Cardiology | Admitting: Cardiology

## 2024-02-15 DIAGNOSIS — I70211 Atherosclerosis of native arteries of extremities with intermittent claudication, right leg: Secondary | ICD-10-CM

## 2024-02-17 ENCOUNTER — Encounter (HOSPITAL_COMMUNITY)

## 2024-02-18 ENCOUNTER — Telehealth (HOSPITAL_COMMUNITY): Payer: Self-pay

## 2024-02-18 NOTE — Telephone Encounter (Signed)
 Pt has been out of rehab d/t dealing with R foot pain and R toenail pain. She has been unable to fit her foot into a shoe comfortably and she cannot walk in the program with open-toed shoes. Pt is still walking at home as able. Pt has an appt made with Dr. Ladona on the 16th to addressed these issues.  Garen FORBES Candy MS, ACSM-CEP 02/18/2024 12:02 PM

## 2024-02-18 NOTE — Progress Notes (Signed)
 PAD/SET Plan of Care Updates  Patient Details  Name: [name] [name] MRN: [id number] Date of Birth: [birth year]  Entry Date:  Flowsheet Row Supervised Exercise Therapy from 01/26/2024 in [facility]  Date 01/26/24   Expected Discharge Date:  Flowsheet Row Supervised Exercise Therapy from 01/26/2024 in [facility]  Expected Discharge Date 04/18/24    Medical Diagnosis: Atherosclerosis of native arteries of extremities with intermittent claudication, right leg [facility])  Encounter Date: 02/18/24  Exercise Target Goals: Exercise Program Goal: Individual exercise prescription set with THRR, safety & activity barriers. Participant demonstrates ability to understand and report RPE using RPE 6-20 scale, to self-measure pulse accurately, and to acknowledge the importance of the exercise prescription.  Exercise Prescription Goal: Use initial exercise assessment to set exercise prescription to improve time and distance to onset of claudication pain. Provide education to aid in steps toward risk factor modification, improve quality of life with claudication, and utilize THRR and exercise prescription for best results for decreasing symptoms of PAD. Prevent injury to bone, joints, and skin integrity during the exercise through checks of each system during session check in. Following physician guidelines for any contraindications to specific exercises.  Activity Barriers:  Activity Barriers & Cardiac Risk Stratification - 01/26/24 1216       Activity Barriers & Cardiac Risk Stratification   Activity Barriers None;Other (comment)    Comments PAD    Cardiac Risk Stratification High          Initial Exercise Prescription:  Initial Exercise Prescription - 01/26/24 1200       Date of Initial Exercise RX and Referring Provider   Date 01/26/24    Referring Provider Dr. Gordy Schwalbe     Expected Discharge Date 04/18/24      Treadmill   MPH 1.7    Grade 2    Minutes 25    METs 2.8      Prescription Details   Frequency (times per week) 3 days/week      Intensity   THRR 40-80% of Max Heartrate 61-122    Ratings of Perceived Exertion 11-13    Perceived Dyspnea 0-4      Progression   Progression Continue to follow PAD protocol      Resistance Training   Training Prescription Yes    Weight 3    Reps 10-15          Perform Capillary Blood Glucose checks as needed.  Current Exercise Prescription:   Exercise Prescription Changes     Row Name 01/27/24 1200 02/10/24 1100           Response to Exercise   Blood Pressure (Admit) 140/62 118/62      Blood Pressure (Exercise) 130/62 110/60      Blood Pressure (Exit) 112/52 92/52      Heart Rate (Admit) 80 bpm 90 bpm      Heart Rate (Exercise) 103 bpm 115 bpm      Heart Rate (Exit) 85 bpm 94 bpm      Oxygen Saturation (Admit) 100 % 99 %      Oxygen Saturation (Exercise) 100 % 98 %      Oxygen Saturation (Exit) 100 % 100 %      Rating of Perceived Exertion (Exercise) 12 14      Perceived Dyspnea (Exercise) 0 0      Symptoms Moderate claudication pain on TM Moderate  claudication pain on TM      Comments Pt first day in program --      Duration Progress to 10 minutes continuous walking  at current work load and total walking time to 30-45 min Progress to 10 minutes continuous walking  at current work load and total walking time to 30-45 min      Intensity THRR unchanged THRR unchanged        Progression   Progression Continue to follow PAD protocol Continue to follow PAD protocol      Average METs 2.8 2.8        Resistance Training   Training Prescription --  No weights on Wednesday --  No weights today        Treadmill   MPH 1.7 1.7      Grade 2 2      Minutes 22  22-23 minutes of walking time on TM. 8 minute warm-up on TM 1.3/1.0 27  5 min warmup included      METs 2.8 2.8         Exercise  Comments:   Exercise Comments     Row Name 01/26/24 1220 01/27/24 1214         Exercise Comments Pt SET orenitation. pt tolerated TM test well w/o unusual signs and symptoms. Pt plans to return tomorrow for first day of exercise. Pt first day in program, pt longest walk today was 6 minutes and 3 seconds. She walked 22 minutes and rested for 17 minutes. Will continue to monitor progress and adjust WL accordingly.         Program and Patient Goals Exercise Goals and Review:   Exercise Goals     Row Name 01/26/24 1219             Exercise Goals   Increase Physical Activity Yes       Intervention Provide advice, education, support and counseling about physical activity/exercise needs.;Develop an individualized exercise prescription for aerobic and resistive training based on initial evaluation findings, risk stratification, comorbidities and participant's personal goals.       Expected Outcomes Short Term: Attend rehab on a regular basis to increase amount of physical activity.;Long Term: Add in home exercise to make exercise part of routine and to increase amount of physical activity.;Long Term: Exercising regularly at least 3-5 days a week.       Increase Strength and Stamina Yes       Intervention Provide advice, education, support and counseling about physical activity/exercise needs.;Develop an individualized exercise prescription for aerobic and resistive training based on initial evaluation findings, risk stratification, comorbidities and participant's personal goals.       Expected Outcomes Short Term: Increase workloads from initial exercise prescription for resistance, speed, and METs.;Long Term: Improve cardiorespiratory fitness, muscular endurance and strength as measured by increased METs and functional capacity ( );Short Term: Perform resistance training exercises routinely during rehab and add in resistance training at home       Able to understand and use rate of  perceived exertion (RPE) scale Yes       Intervention Provide education and explanation on how to use RPE scale       Expected Outcomes Short Term: Able to use RPE daily in rehab to express subjective intensity level;Long Term:  Able to use RPE to guide intensity level when exercising independently       Knowledge and understanding of Target Heart Rate Range (THRR) Yes       Intervention  Provide education and explanation of THRR including how the numbers were predicted and where they are located for reference       Expected Outcomes Short Term: Able to state/look up THRR;Long Term: Able to use THRR to govern intensity when exercising independently;Short Term: Able to use daily as guideline for intensity in rehab       Understanding of Exercise Prescription Yes       Intervention Provide education, explanation, and written materials on patient's individual exercise prescription       Expected Outcomes Short Term: Able to explain program exercise prescription;Long Term: Able to explain home exercise prescription to exercise independently       Improve claudication pain toleration; Improve walking ability Yes       Intervention Participate in PAD/SET Rehab 2-3 days a week, walking at home as part of exercise prescription;Attend education sessions to aid in risk factor modification and understanding of disease process       Expected Outcomes Long Term: Improve walking ability and toleration to claudication;Long Term: Improve score of PAD questionnaires;Short Term: Improve walking distance/time to onset of claudication pain          Nutrition:  Target Goals: Understanding of nutrition guidelines, daily intake of sodium 1500mg , cholesterol 200mg , calories 30% from fat and 7% or less from saturated fats, daily to have 5 or more servings of fruits and vegetables.  Biometrics:  Pre Biometrics - 01/26/24 1032       Pre Biometrics   Waist Circumference 37.5 inches    Hip Circumference 38.5 inches     Waist to Hip Ratio 0.97 %    Triceps Skinfold 19 mm    % Body Fat 35.7 %    Grip Strength 14 kg    Flexibility 9.5 in    Single Leg Stand 3.68 seconds           Nutrition Therapy Plan and Nutrition Goals:   Core Components/Risk Factors/Patient Goals at Admission:  Personal Goals and Risk Factors at Admission - 01/26/24 1227       Core Components/Risk Factors/Patient Goals on Admission    Weight Management Weight Loss    Tobacco Cessation Yes    Intervention Assist the participant in steps to quit. Provide individualized education and counseling about committing to Tobacco Cessation, relapse prevention, and pharmacological support that can be provided by physician.;Education officer, environmental, assist with locating and accessing local/national Quit Smoking programs, and support quit date choice.    Expected Outcomes Short Term: Will demonstrate readiness to quit, by selecting a quit date.;Long Term: Complete abstinence from all tobacco products for at least 12 months from quit date.;Short Term: Will quit all tobacco product use, adhering to prevention of relapse plan.    Hypertension Yes    Intervention Provide education on lifestyle modifcations including regular physical activity/exercise, weight management, moderate sodium restriction and increased consumption of fresh fruit, vegetables, and low fat dairy, alcohol moderation, and smoking cessation.;Monitor prescription use compliance.    Expected Outcomes Short Term: Continued assessment and intervention until BP is < 140/39mm HG in hypertensive participants. < 130/62mm HG in hypertensive participants with diabetes, heart failure or chronic kidney disease.;Long Term: Maintenance of blood pressure at goal levels.    Lipids Yes    Intervention Provide education and support for participant on nutrition & aerobic/resistive exercise along with prescribed medications to achieve LDL 70mg , HDL >40mg .    Expected Outcomes Short Term:  Participant states understanding of desired cholesterol values and is compliant with  medications prescribed. Participant is following exercise prescription and nutrition guidelines.;Long Term: Cholesterol controlled with medications as prescribed, with individualized exercise RX and with personalized nutrition plan. Value goals: LDL < 70mg , HDL > 40 mg.          ITP Comments:  ITP Comments     Row Name 01/26/24 1153           ITP Comments Dr. Ladona Referring/Supervising SET physician.          Comments: 30-day update on patient. Patient is currently wlaking on the TM at 1.7 speed and 2.0% incline.   Prior to this week pt consistently attended rehab sessions, however she has been absent d/t R foot and R big toenail pain that is preventing her from wearing closed-toed shoes comfortably. Over the course of 30 days, Ruthellen has made small improvements in the amount of time she can walk without stopping (from 5 minutes to 7 minutes) and increased her total exercise time (from 22 to 27 minutes). Unfortunately Raisha is continuing to smoke and I have not has another opportunity to educate her on the importance of cessation since she's been out. Will continue to monitor her progress and educate pt on lifestyle changes to further improve walking capacity. I believe Nikol can continue benefit from another 30 days of SET.  Garen FORBES Candy MS, ACSM-CEP  02/18/2024 12:20 PM   Physician Documentation: Your signature/cosign is required to indicate approval of the Care Plan as stated above. By signing this report, you are approving the Plan of Care. Please sign and either send electronically or print and fax the signed copy to the number below. Please indicate any changes or limitations in the space provided.   ____________________________________________________________________________________________________________________________________________  Physician Signature:  _____________________________________________________ Print Physician Name: ____________________________________________________ Date: ___________________________ Time: _________________________________  [facility]  [place]. [facility] [place]. [facility] [address] [place], [place] [address] Phone: [phone] Fax: [phone]

## 2024-02-22 ENCOUNTER — Encounter (HOSPITAL_COMMUNITY)

## 2024-02-24 ENCOUNTER — Encounter (HOSPITAL_COMMUNITY)

## 2024-02-26 ENCOUNTER — Telehealth (HOSPITAL_COMMUNITY): Payer: Self-pay

## 2024-02-26 ENCOUNTER — Encounter (HOSPITAL_COMMUNITY)

## 2024-02-26 NOTE — Telephone Encounter (Addendum)
 Tried to call [name] [name] to understand plan. I am aware that she has had R foot pain and R toenail pain. She has been unable to well closed toed shoes, which is a requirement for SET program. Unable to reach [name] or leave a message. Will try to send MyChart message.

## 2024-02-29 ENCOUNTER — Encounter (HOSPITAL_COMMUNITY)

## 2024-02-29 DIAGNOSIS — R195 Other fecal abnormalities: Secondary | ICD-10-CM | POA: Diagnosis not present

## 2024-02-29 DIAGNOSIS — Z7902 Long term (current) use of antithrombotics/antiplatelets: Secondary | ICD-10-CM | POA: Diagnosis not present

## 2024-02-29 DIAGNOSIS — D509 Iron deficiency anemia, unspecified: Secondary | ICD-10-CM | POA: Diagnosis not present

## 2024-02-29 LAB — CBC
Hematocrit: 38 % (ref 34.0–46.6)
Hemoglobin: 11.7 g/dL (ref 11.1–15.9)
MCH: 29.2 pg (ref 26.6–33.0)
MCHC: 30.8 g/dL — ABNORMAL LOW (ref 31.5–35.7)
MCV: 95 fL (ref 79–97)
Platelets: 583 x10E3/uL — ABNORMAL HIGH (ref 150–450)
RBC: 4.01 x10E6/uL (ref 3.77–5.28)
RDW: 13.2 % (ref 11.7–15.4)
WBC: 8.6 x10E3/uL (ref 3.4–10.8)

## 2024-03-02 ENCOUNTER — Encounter: Payer: Self-pay | Admitting: Cardiology

## 2024-03-02 ENCOUNTER — Telehealth (HOSPITAL_COMMUNITY): Payer: Self-pay

## 2024-03-02 ENCOUNTER — Encounter (HOSPITAL_COMMUNITY): Admission: RE | Admit: 2024-03-02 | Source: Ambulatory Visit

## 2024-03-02 ENCOUNTER — Ambulatory Visit: Attending: Cardiology | Admitting: Cardiology

## 2024-03-02 VITALS — BP 145/68 | HR 80 | Resp 16 | Ht 64.0 in | Wt 133.8 lb

## 2024-03-02 DIAGNOSIS — F17200 Nicotine dependence, unspecified, uncomplicated: Secondary | ICD-10-CM | POA: Diagnosis not present

## 2024-03-02 DIAGNOSIS — D6859 Other primary thrombophilia: Secondary | ICD-10-CM | POA: Diagnosis not present

## 2024-03-02 DIAGNOSIS — E782 Mixed hyperlipidemia: Secondary | ICD-10-CM

## 2024-03-02 DIAGNOSIS — I739 Peripheral vascular disease, unspecified: Secondary | ICD-10-CM | POA: Diagnosis not present

## 2024-03-02 DIAGNOSIS — F17210 Nicotine dependence, cigarettes, uncomplicated: Secondary | ICD-10-CM | POA: Diagnosis not present

## 2024-03-02 NOTE — Telephone Encounter (Signed)
 Called to follow up after appt with Dr. Ladona. Pt stated she is wrapping toe and finding larger shoes to accommodate. Plan to return to SET program on 7/21.

## 2024-03-02 NOTE — Progress Notes (Signed)
 Cardiology Office Note:  .   Date:  03/02/2024  ID:  [name] [name], DOB 10-09-1955, MRN [id number] PCP: Mercer Clotilda SAUNDERS, MD  [facility] Providers Cardiologist:  Alm Clay, MD Cardiology APP:  Janene Boer, [place]   History of Present Illness: .   [name] [name] is a 68 y.o. Caucasian female patient with prior history of tobacco use smoking about half pack of cigarettes a day, primary hypertension, hypercholesterolemia, primary hypercoagulable state with positive lupus anticoagulant with extensive DVT and PE SP endovascular therapy and stenting of the lower extremity venous vasculature and IVC filter placement presently on long-term Xarelto , CAD with LAD and CX stenting in 2020, mildly reduced LVEF at around 45 to 50% on 09/13/2018, severe peripheral arterial disease with moderately reduced bilateral ABI, history of right distal SFA artery stenting on 06/02/2022, history of pseudoaneurysm repair at that time, repeat angiography revealing occluded right popliteal artery and underwent Supera stent placement on 04/07/2023.  In view of symptomatic claudication and severe decrease in her ABI, underwent repeat angiogram on 01/12/2024 by me revealing occluded right SFA stent and popliteal artery stent with one-vessel runoff.  I reviewed my angiograms with colleagues, felt that femoral to below-knee bypass surgery was not a good option in a young individual without CLI, could consider laser atherectomy.  In view of persistent symptoms of claudication she now presents to the office to discuss further options.  She needs Lovenox  bridging.  Discussed the use of AI scribe software for clinical note transcription with the patient, who gave verbal consent to proceed.  History of Present Illness [name] [name] is a 68 year old female with peripheral arteriosclerosis who presents with leg pain and discomfort. She is accompanied by her brother. She was referred by Dr. Court for evaluation of leg pain and  consideration of laser atherectomy or bypass surgery.  She experiences persistent leg pain and discomfort, significantly limiting her ability to perform daily activities. The pain worsens with walking, necessitating frequent rest periods. Despite following an exercise regimen of walking until pain occurs, resting, and then resuming, she remains symptomatic. Her condition led to her recent retirement two months ago due to leg issues.  She has undergone previous procedures, including stent placement, but her symptoms have persisted. She smokes and currently takes pravastatin  and fenofibrate  for cholesterol management but has experienced muscle aches with other statins.  Her family history includes heart trouble, with her grandfather being a smoker. She drinks alcohol but not excessively. She wants to return to work if her leg condition improves.  Labs   Lab Results  Component Value Date   CHOL 188 01/01/2024   HDL 43 01/01/2024   LDLCALC 94 01/01/2024   LDLDIRECT 141.0 11/25/2018   TRIG 307 (H) 01/01/2024   CHOLHDL 4.4 01/01/2024   Lipoprotein (a)  Date/Time Value Ref Range Status  07/19/2020 11:22 AM 22.2 <75.0 nmol/L Final    Comment:    Note:  Values greater than or equal to 75.0 nmol/L may        indicate an independent risk factor for CHD,        but must be evaluated with caution when applied        to non-Caucasian populations due to the        influence of genetic factors on Lp(a) across        ethnicities.     Lab Results  Component Value Date   NA 139 01/06/2024   K 4.2 01/06/2024  CO2 21 01/06/2024   GLUCOSE 91 01/06/2024   BUN 13 01/06/2024   CREATININE 0.92 01/06/2024   CALCIUM  10.4 (H) 01/06/2024   GFR 69.39 11/12/2023   EGFR 68 01/06/2024   GFRNONAA 54 (L) 12/07/2023      Latest Ref Rng & Units 01/06/2024   12:00 AM 12/07/2023   11:30 AM 11/12/2023    4:05 PM  BMP  Glucose 70 - 99 mg/dL 91  94  91   BUN 8 - 27 mg/dL 13  16  13    Creatinine 0.57 - 1.00  mg/dL 9.07  8.87  9.13   BUN/Creat Ratio 12 - 28 14     Sodium 134 - 144 mmol/L 139  135  137   Potassium 3.5 - 5.2 mmol/L 4.2  3.9  3.4   Chloride 96 - 106 mmol/L 100  106  102   CO2 20 - 29 mmol/L 21  17  26    Calcium  8.7 - 10.3 mg/dL 89.5  9.9  9.6       Latest Ref Rng & Units 02/29/2024   12:19 PM 01/06/2024   12:00 AM 12/07/2023   11:30 AM  CBC  WBC 3.4 - 10.8 x10E3/uL 8.6  9.6  10.1   Hemoglobin 11.1 - 15.9 g/dL 88.2  87.8  86.9   Hematocrit 34.0 - 46.6 % 38.0  37.5  41.4   Platelets 150 - 450 x10E3/uL 583  538  492    No results found for: HGBA1C  Lab Results  Component Value Date   TSH 1.050 02/27/2022     ROS  Review of Systems  Cardiovascular:  Positive for claudication (right calf). Negative for chest pain, dyspnea on exertion and leg swelling.   Physical Exam:   VS:  BP (!) 145/68 (BP Location: Left Arm, Patient Position: Sitting, Cuff Size: Normal)   Pulse 80   Resp 16   Ht 5' 4 (1.626 m)   Wt 133 lb 12.8 oz (60.7 kg)   SpO2 100%   BMI 22.97 kg/m    Wt Readings from Last 3 Encounters:  03/02/24 133 lb 12.8 oz (60.7 kg)  02/10/24 134 lb 14.7 oz (61.2 kg)  02/02/24 137 lb (62.1 kg)    Physical Exam Neck:     Vascular: No carotid bruit or JVD.  Cardiovascular:     Rate and Rhythm: Normal rate and regular rhythm.     Pulses: Intact distal pulses.          Femoral pulses are 2+ on the right side and 2+ on the left side.      Popliteal pulses are 0 on the right side and 0 on the left side.       Dorsalis pedis pulses are 0 on the right side and 0 on the left side.       Posterior tibial pulses are 0 on the right side and 0 on the left side.     Heart sounds: Normal heart sounds. No murmur heard.    No gallop.     Comments: CRT < 2 Sec Pulmonary:     Effort: Pulmonary effort is normal.     Breath sounds: Normal breath sounds.  Abdominal:     General: Bowel sounds are normal.     Palpations: Abdomen is soft.  Musculoskeletal:     Right lower leg: No  edema.     Left lower leg: No edema.    Studies Reviewed: SABRA    ABI 12/22/2023: +-------+-----------+-----------+------------+------------+  ABI/TBIToday's ABIToday's TBIPrevious ABIPrevious TBI  +-------+-----------+-----------+------------+------------+  Right 0.44       0.30       1.06        1.15          +-------+-----------+-----------+------------+------------+  Left  0.85       0.39       0.75        0.73          +-------+-----------+-----------+------------+------------+    Peripheral arteriogram 01/12/2024: Abdominal aortogram with limited bifemoral arteriogram:  Single renal arteries on each side with mild disease.  The abdominal aorta shows moderate atherosclerotic changes and a 30% stenosis in the distal abdominal aorta.  Aortoiliac bifurcation shows mild disease.  Bilateral CFA are widely patent. Right femoral arteriogram with distal runoff: The proximal SFA has mild disease.  Mid to distal SFA proximal to the superior stent is widely patent with minimal disease.  The distal SFA is occluded proximal to the superior stent and reconstitutes at the distal popliteal artery below the knee. There is three-vessel runoff after the reconstitution.  Extensive collaterals are noted.   Proximal stent occlusion                    Distal reconstitution  EKG:         Medications ordered    No orders of the defined types were placed in this encounter.    ASSESSMENT AND PLAN: .      ICD-10-CM   1. Claudication in peripheral vascular disease (HCC)  I73.9 Basic Metabolic Panel (BMET)    2. Hypercoagulation syndrome (HCC)  D68.59     3. Mixed hypercholesterolemia and hypertriglyceridemia  E78.2 Lipid Profile    AMB Referral to [facility] Pharm-D    4. Tobacco use disorder  F17.200      Assessment & Plan Peripheral Arterial Disease Chronic peripheral arterial disease with significant right calf pain and discomfort. Symptoms have improved with exercise but  remain limiting. Previous procedure performed without complications. Current plan is to perform laser atherectomy to open the occluded artery. Risks include bleeding complications (1-2% risk), pseudoaneurysm, and potential need for emergent bypass surgery. Smoking cessation is critical to reduce procedural risks and improve outcomes. Alternative of bypass surgery discussed, high risk and typically reserved for emergencies or limb salvage.  Patient continues to have severe lifestyle-limiting claudication however with exercise therapy, her walking distance is clearly improved.  Lower extremity bilaterally are warm, capillary refill time is <2 seconds.  However in view of symptoms, proceed with peripheral arteriogram. - Perform peripheral arteriogram and laser atherectomy - Emphasize smoking cessation to reduce procedural risks and improve outcomes - Continue exercise regimen as tolerated  Hypercoagulable state and thrombophilia Blood clotting disorder increases risk of thrombotic events, exacerbated by smoking. Smoking cessation is crucial to manage this condition and reduce risk of complications during procedures. - Emphasize importance of smoking cessation to manage blood clotting disorder - She will need Lovenox  bridging prior to the procedure.  Smoking Cessation Counseling Continued smoking poses significant risk to cardiovascular health and procedural outcomes. Smoking cessation is essential to prevent further vascular complications and improve overall health. Discussed the impact of smoking on blood clotting and procedural risks. - Strongly advise complete smoking cessation immediately - I spent additional 45 minutes discussing the importance of smoking cessation, choosing either smoking cigarettes versus choosing her legs.  Patient also has underlying coronary artery disease.  Hyperlipidemia Hyperlipidemia managed with pravastatin  and fenofibrate . Previous intolerance to statins like  Lipitor and  Crestor  due to muscle aches. Discussed potential use of Repatha , a PCSK9 inhibitor, as an alternative treatment option. - Refer to lipid clinic for evaluation and initiation of Repatha  - Goal LDL less than 55.  Fungal Nail Infection Chronic fungal nail infection with associated pain and difficulty wearing shoes. Treatment deferred until circulation is improved due to risk of poor healing. - Advise use of protective tape and larger shoes to accommodate the nail - Would not recommend any nail trimming close to her skin as she may develop nonhealing ulceration as her ABI is severely reduced on the right.  I will see her back in 2 months for follow-up.  Signed,  Gordy Bergamo, MD, [facility] 03/02/2024, 8:35 PM [facility] [address] [place], [place] [address] Phone: [phone]. Fax:  [phone]

## 2024-03-02 NOTE — H&P (View-Only) (Signed)
 Cardiology Office Note:  .   Date:  03/02/2024  ID:  [name] [name], DOB 10-09-1955, MRN [id number] PCP: Mercer Clotilda SAUNDERS, MD  [facility] Providers Cardiologist:  Alm Clay, MD Cardiology APP:  Janene Boer, [place]   History of Present Illness: .   [name] [name] is a 68 y.o. Caucasian female patient with prior history of tobacco use smoking about half pack of cigarettes a day, primary hypertension, hypercholesterolemia, primary hypercoagulable state with positive lupus anticoagulant with extensive DVT and PE SP endovascular therapy and stenting of the lower extremity venous vasculature and IVC filter placement presently on long-term Xarelto , CAD with LAD and CX stenting in 2020, mildly reduced LVEF at around 45 to 50% on 09/13/2018, severe peripheral arterial disease with moderately reduced bilateral ABI, history of right distal SFA artery stenting on 06/02/2022, history of pseudoaneurysm repair at that time, repeat angiography revealing occluded right popliteal artery and underwent Supera stent placement on 04/07/2023.  In view of symptomatic claudication and severe decrease in her ABI, underwent repeat angiogram on 01/12/2024 by me revealing occluded right SFA stent and popliteal artery stent with one-vessel runoff.  I reviewed my angiograms with colleagues, felt that femoral to below-knee bypass surgery was not a good option in a young individual without CLI, could consider laser atherectomy.  In view of persistent symptoms of claudication she now presents to the office to discuss further options.  She needs Lovenox  bridging.  Discussed the use of AI scribe software for clinical note transcription with the patient, who gave verbal consent to proceed.  History of Present Illness [name] [name] is a 68 year old female with peripheral arteriosclerosis who presents with leg pain and discomfort. She is accompanied by her brother. She was referred by Dr. Court for evaluation of leg pain and  consideration of laser atherectomy or bypass surgery.  She experiences persistent leg pain and discomfort, significantly limiting her ability to perform daily activities. The pain worsens with walking, necessitating frequent rest periods. Despite following an exercise regimen of walking until pain occurs, resting, and then resuming, she remains symptomatic. Her condition led to her recent retirement two months ago due to leg issues.  She has undergone previous procedures, including stent placement, but her symptoms have persisted. She smokes and currently takes pravastatin  and fenofibrate  for cholesterol management but has experienced muscle aches with other statins.  Her family history includes heart trouble, with her grandfather being a smoker. She drinks alcohol but not excessively. She wants to return to work if her leg condition improves.  Labs   Lab Results  Component Value Date   CHOL 188 01/01/2024   HDL 43 01/01/2024   LDLCALC 94 01/01/2024   LDLDIRECT 141.0 11/25/2018   TRIG 307 (H) 01/01/2024   CHOLHDL 4.4 01/01/2024   Lipoprotein (a)  Date/Time Value Ref Range Status  07/19/2020 11:22 AM 22.2 <75.0 nmol/L Final    Comment:    Note:  Values greater than or equal to 75.0 nmol/L may        indicate an independent risk factor for CHD,        but must be evaluated with caution when applied        to non-Caucasian populations due to the        influence of genetic factors on Lp(a) across        ethnicities.     Lab Results  Component Value Date   NA 139 01/06/2024   K 4.2 01/06/2024  CO2 21 01/06/2024   GLUCOSE 91 01/06/2024   BUN 13 01/06/2024   CREATININE 0.92 01/06/2024   CALCIUM  10.4 (H) 01/06/2024   GFR 69.39 11/12/2023   EGFR 68 01/06/2024   GFRNONAA 54 (L) 12/07/2023      Latest Ref Rng & Units 01/06/2024   12:00 AM 12/07/2023   11:30 AM 11/12/2023    4:05 PM  BMP  Glucose 70 - 99 mg/dL 91  94  91   BUN 8 - 27 mg/dL 13  16  13    Creatinine 0.57 - 1.00  mg/dL 9.07  8.87  9.13   BUN/Creat Ratio 12 - 28 14     Sodium 134 - 144 mmol/L 139  135  137   Potassium 3.5 - 5.2 mmol/L 4.2  3.9  3.4   Chloride 96 - 106 mmol/L 100  106  102   CO2 20 - 29 mmol/L 21  17  26    Calcium  8.7 - 10.3 mg/dL 89.5  9.9  9.6       Latest Ref Rng & Units 02/29/2024   12:19 PM 01/06/2024   12:00 AM 12/07/2023   11:30 AM  CBC  WBC 3.4 - 10.8 x10E3/uL 8.6  9.6  10.1   Hemoglobin 11.1 - 15.9 g/dL 88.2  87.8  86.9   Hematocrit 34.0 - 46.6 % 38.0  37.5  41.4   Platelets 150 - 450 x10E3/uL 583  538  492    No results found for: HGBA1C  Lab Results  Component Value Date   TSH 1.050 02/27/2022     ROS  Review of Systems  Cardiovascular:  Positive for claudication (right calf). Negative for chest pain, dyspnea on exertion and leg swelling.   Physical Exam:   VS:  BP (!) 145/68 (BP Location: Left Arm, Patient Position: Sitting, Cuff Size: Normal)   Pulse 80   Resp 16   Ht 5' 4 (1.626 m)   Wt 133 lb 12.8 oz (60.7 kg)   SpO2 100%   BMI 22.97 kg/m    Wt Readings from Last 3 Encounters:  03/02/24 133 lb 12.8 oz (60.7 kg)  02/10/24 134 lb 14.7 oz (61.2 kg)  02/02/24 137 lb (62.1 kg)    Physical Exam Neck:     Vascular: No carotid bruit or JVD.  Cardiovascular:     Rate and Rhythm: Normal rate and regular rhythm.     Pulses: Intact distal pulses.          Femoral pulses are 2+ on the right side and 2+ on the left side.      Popliteal pulses are 0 on the right side and 0 on the left side.       Dorsalis pedis pulses are 0 on the right side and 0 on the left side.       Posterior tibial pulses are 0 on the right side and 0 on the left side.     Heart sounds: Normal heart sounds. No murmur heard.    No gallop.     Comments: CRT < 2 Sec Pulmonary:     Effort: Pulmonary effort is normal.     Breath sounds: Normal breath sounds.  Abdominal:     General: Bowel sounds are normal.     Palpations: Abdomen is soft.  Musculoskeletal:     Right lower leg: No  edema.     Left lower leg: No edema.    Studies Reviewed: SABRA    ABI 12/22/2023: +-------+-----------+-----------+------------+------------+  ABI/TBIToday's ABIToday's TBIPrevious ABIPrevious TBI  +-------+-----------+-----------+------------+------------+  Right 0.44       0.30       1.06        1.15          +-------+-----------+-----------+------------+------------+  Left  0.85       0.39       0.75        0.73          +-------+-----------+-----------+------------+------------+    Peripheral arteriogram 01/12/2024: Abdominal aortogram with limited bifemoral arteriogram:  Single renal arteries on each side with mild disease.  The abdominal aorta shows moderate atherosclerotic changes and a 30% stenosis in the distal abdominal aorta.  Aortoiliac bifurcation shows mild disease.  Bilateral CFA are widely patent. Right femoral arteriogram with distal runoff: The proximal SFA has mild disease.  Mid to distal SFA proximal to the superior stent is widely patent with minimal disease.  The distal SFA is occluded proximal to the superior stent and reconstitutes at the distal popliteal artery below the knee. There is three-vessel runoff after the reconstitution.  Extensive collaterals are noted.   Proximal stent occlusion                    Distal reconstitution  EKG:         Medications ordered    No orders of the defined types were placed in this encounter.    ASSESSMENT AND PLAN: .      ICD-10-CM   1. Claudication in peripheral vascular disease (HCC)  I73.9 Basic Metabolic Panel (BMET)    2. Hypercoagulation syndrome (HCC)  D68.59     3. Mixed hypercholesterolemia and hypertriglyceridemia  E78.2 Lipid Profile    AMB Referral to [facility] Pharm-D    4. Tobacco use disorder  F17.200      Assessment & Plan Peripheral Arterial Disease Chronic peripheral arterial disease with significant right calf pain and discomfort. Symptoms have improved with exercise but  remain limiting. Previous procedure performed without complications. Current plan is to perform laser atherectomy to open the occluded artery. Risks include bleeding complications (1-2% risk), pseudoaneurysm, and potential need for emergent bypass surgery. Smoking cessation is critical to reduce procedural risks and improve outcomes. Alternative of bypass surgery discussed, high risk and typically reserved for emergencies or limb salvage.  Patient continues to have severe lifestyle-limiting claudication however with exercise therapy, her walking distance is clearly improved.  Lower extremity bilaterally are warm, capillary refill time is <2 seconds.  However in view of symptoms, proceed with peripheral arteriogram. - Perform peripheral arteriogram and laser atherectomy - Emphasize smoking cessation to reduce procedural risks and improve outcomes - Continue exercise regimen as tolerated  Hypercoagulable state and thrombophilia Blood clotting disorder increases risk of thrombotic events, exacerbated by smoking. Smoking cessation is crucial to manage this condition and reduce risk of complications during procedures. - Emphasize importance of smoking cessation to manage blood clotting disorder - She will need Lovenox  bridging prior to the procedure.  Smoking Cessation Counseling Continued smoking poses significant risk to cardiovascular health and procedural outcomes. Smoking cessation is essential to prevent further vascular complications and improve overall health. Discussed the impact of smoking on blood clotting and procedural risks. - Strongly advise complete smoking cessation immediately - I spent additional 45 minutes discussing the importance of smoking cessation, choosing either smoking cigarettes versus choosing her legs.  Patient also has underlying coronary artery disease.  Hyperlipidemia Hyperlipidemia managed with pravastatin  and fenofibrate . Previous intolerance to statins like  Lipitor and  Crestor  due to muscle aches. Discussed potential use of Repatha , a PCSK9 inhibitor, as an alternative treatment option. - Refer to lipid clinic for evaluation and initiation of Repatha  - Goal LDL less than 55.  Fungal Nail Infection Chronic fungal nail infection with associated pain and difficulty wearing shoes. Treatment deferred until circulation is improved due to risk of poor healing. - Advise use of protective tape and larger shoes to accommodate the nail - Would not recommend any nail trimming close to her skin as she may develop nonhealing ulceration as her ABI is severely reduced on the right.  I will see her back in 2 months for follow-up.  Signed,  Gordy Bergamo, MD, [facility] 03/02/2024, 8:35 PM [facility] [address] [place], [place] [address] Phone: [phone]. Fax:  [phone]

## 2024-03-02 NOTE — Patient Instructions (Addendum)
 Medication Instructions:  Your physician recommends that you continue on your current medications as directed. Please refer to the Current Medication list given to you today.  *If you need a refill on your cardiac medications before your next appointment, please call your pharmacy*  Lab Work: Have lab work done today in the lab on the first floor--BMP and lipids If you have labs (blood work) drawn today and your tests are completely normal, you will receive your results only by: MyChart Message (if you have MyChart) OR A paper copy in the mail If you have any lab test that is abnormal or we need to change your treatment, we will call you to review the results.  Testing/Procedures: Your physician has requested that you have a peripheral vascular angiogram. This exam is performed at the hospital. During this exam IV contrast is used to look at arterial blood flow. Please review the information sheet given for details. Scheduled for August 14  Follow-Up: At [facility], you and your health needs are our priority.  As part of our continuing mission to provide you with exceptional heart care, our providers are all part of one team.  This team includes your primary Cardiologist (physician) and Advanced Practice Providers or APPs (Physician Assistants and Nurse Practitioners) who all work together to provide you with the care you need, when you need it.  Your next appointment:  9/12 at 4 PM   Provider:   Dr Ladona   We recommend signing up for the patient portal called MyChart.  Sign up information is provided on this After Visit Summary.  MyChart is used to connect with patients for Virtual Visits (Telemedicine).  Patients are able to view lab/test results, encounter notes, upcoming appointments, etc.  Non-urgent messages can be sent to your provider as well.   To learn more about what you can do with MyChart, go to [email or web address].   Other Instructions Pharmacist will  contact you with instructions regarding Lovenox  bridging  You have been referred to the lipid clinic in our office.  Appointment is July 30 at 1:45     [facility] A DEPT OF [facility] [address] A DEPT OF THE [place] [address] [place] [address] Dept: [phone] Loc: [phone]  [name] [name]  03/02/2024  You are scheduled for a Peripheral Angiogram on Thursday, August 14 with Dr. Gordy Ladona.  1. Please arrive at the [facility] (Main Entrance A) at [facility]: [address] [place], [place] [address] at 11:00 AM (This time is 2 hour(s) before your procedure to ensure your preparation).   Free valet parking service is available. You will check in at ADMITTING. The support person will be asked to wait in the waiting room.  It is OK to have someone drop you off and come back when you are ready to be discharged.    Special note: Every effort is made to have your procedure done on time. Please understand that emergencies sometimes delay scheduled procedures.  2. Diet: Do not eat solid foods after midnight.  The patient may have clear liquids until 5am upon the day of the procedure.  3. Labs: done on July 16  4. Medication instructions in preparation for your procedure:  Pharmacist will contact you with instruction regarding Lovenox  bridging prior to procedure   Contrast Allergy: No   On the morning of your procedure, take your Aspirin  81 mg and any  morning medicines NOT listed above.  You may use sips of water.  5. Plan to go home the same day, you will only stay overnight if medically necessary. 6. Bring a current list of your medications and current insurance cards. 7. You MUST have a responsible person to drive you home. 8. Someone MUST be with you the first 24 hours after you arrive home or your discharge will be delayed. 9. Please wear clothes that are easy to get on and off and wear slip-on  shoes.  Thank you for allowing us  to care for you!   -- [facility]

## 2024-03-04 ENCOUNTER — Encounter (HOSPITAL_COMMUNITY)

## 2024-03-07 ENCOUNTER — Encounter (HOSPITAL_COMMUNITY)
Admission: RE | Admit: 2024-03-07 | Discharge: 2024-03-07 | Disposition: A | Discharge status: Home / Self Care | Source: Ambulatory Visit | Attending: Cardiology | Admitting: Cardiology

## 2024-03-07 DIAGNOSIS — I70211 Atherosclerosis of native arteries of extremities with intermittent claudication, right leg: Secondary | ICD-10-CM | POA: Insufficient documentation

## 2024-03-07 DIAGNOSIS — E782 Mixed hyperlipidemia: Secondary | ICD-10-CM | POA: Diagnosis not present

## 2024-03-07 DIAGNOSIS — I739 Peripheral vascular disease, unspecified: Secondary | ICD-10-CM | POA: Diagnosis not present

## 2024-03-07 NOTE — Progress Notes (Signed)
 Daily Session Note  Patient Details  Name: [name] [name] MRN: [id number] Date of Birth: [birth year] Referring Provider:   Conrad Ports Supervised Exercise Therapy from 01/26/2024 in [facility] for Heart, Vascular, & Lung Health  Referring Provider Dr. Gordy Schwalbe    Encounter Date: 03/07/2024  Check In:  Session Check In - 03/07/24 1059       Check-In   Supervising physician immediately available to respond to emergencies [facility] MD immediately available    Physician(s) Rosaline Bane, NP    Location [facility]    Staff Present Hadassah Quan, RN, Avonne Gal, MS, ACSM-CEP, Exercise Physiologist;David Janann, MS, ACSM-CEP, CCRP, Exercise Physiologist;Jetta Vannie BS, ACSM-CEP, Exercise Physiologist;Other;Bailey Elnor, MS, Exercise Physiologist;Io Dieujuste Nicholaus, MS, ACSM-CEP, Exercise Physiologist    Virtual Visit No    Medication changes reported     No    Fall or balance concerns reported    No    Tobacco Cessation No Change   Pts smokes two cigarettes/day   Warm-up and Cool-down Performed on first and last piece of equipment    Resistance Training Performed Yes    VAD Patient? No    PAD/SET Patient? No      PAD/SET Patient   Completed foot check today? Yes    Open wounds to report? Yes   Toe fungus, has been seen by MD     Pain Assessment   Currently in Pain? No/denies    Pain Score 0-No pain    Multiple Pain Sites No          Capillary Blood Glucose: No results found for this or any previous visit (from the past 24 hours).   Exercise Prescription Changes - 03/07/24 1100       Response to Exercise   Blood Pressure (Admit) 148/70    Blood Pressure (Exercise) 132/70    Blood Pressure (Exit) 106/60    Heart Rate (Admit) 92 bpm    Heart Rate (Exercise) 114 bpm    Heart Rate (Exit) 98 bpm    Oxygen Saturation (Admit) 100 %    Oxygen Saturation (Exercise) 98 %    Oxygen Saturation (Exit) 98 %    Rating of Perceived  Exertion (Exercise) 14    Perceived Dyspnea (Exercise) 0    Symptoms Moderate claudication pain on TM    Duration Progress to 10 minutes continuous walking  at current work load and total walking time to 30-45 min    Intensity THRR unchanged      Progression   Progression Continue to follow PAD protocol    Average METs 2.8      Resistance Training   Training Prescription Yes    Weight 3    Reps 10-15      Treadmill   MPH 1.7    Grade 2    Minutes 31   5 min warmup included   METs 2.8          Social History   Tobacco Use  Smoking Status Some Days   Current packs/day: 0.50   Average packs/day: 0.5 packs/day for 50.6 years (25.3 ttl pk-yrs)   Types: Cigarettes   Start date: 1975  Smokeless Tobacco Never  Tobacco Comments   Cut back to quarter pack a day    Goals Met:  Independence with exercise equipment Exercise tolerated well No report of concerns or symptoms today Strength training completed today  Goals Unmet:  Not Applicable  Comments:  Pt was cleared for supervised  exercise therapy by Dr. Jagadeesh Gangi Due to patients hx of CV disease pt is being watched closely and monitored for any unusual signs/symptoms while exercising. Will continue to watch closely, monitor, and report any unusual or unexpected pain through the program.   Pt exercised on the Treadmill  for 31 minutes and experienced 3-4/5 claudication pain that resolved with sitting rest breaks. Pt tolerated exercise w/o unusual and unexpected symptoms. Will continue to monitor and progress through the program following PAD protocol.    Initial onset of claudication at: 4:00 Longest exercised time before break: 7:35 Total exercise time/rest time: 31/22   Service time is from 1008 to 1145.   Dr. Wilbert Bihari is Medical Director for Cardiac Rehab at [facility].

## 2024-03-08 ENCOUNTER — Ambulatory Visit: Payer: Self-pay | Admitting: *Deleted

## 2024-03-08 LAB — LIPID PANEL
Chol/HDL Ratio: 3.7 ratio (ref 0.0–4.4)
Cholesterol, Total: 187 mg/dL (ref 100–199)
HDL: 51 mg/dL (ref >39)
LDL Chol Calc (NIH): 95 mg/dL (ref 0–99)
Triglycerides: 244 mg/dL — ABNORMAL HIGH (ref 0–149)
VLDL Cholesterol Cal: 41 mg/dL — ABNORMAL HIGH (ref 5–40)

## 2024-03-08 LAB — BASIC METABOLIC PANEL WITH GFR
BUN/Creatinine Ratio: 11 — ABNORMAL LOW (ref 12–28)
BUN: 10 mg/dL (ref 8–27)
CO2: 21 mmol/L (ref 20–29)
Calcium: 10.4 mg/dL — ABNORMAL HIGH (ref 8.7–10.3)
Chloride: 103 mmol/L (ref 96–106)
Creatinine, Ser: 0.88 mg/dL (ref 0.57–1.00)
Glucose: 89 mg/dL (ref 70–99)
Potassium: 4.4 mmol/L (ref 3.5–5.2)
Sodium: 140 mmol/L (ref 134–144)
eGFR: 72 mL/min/1.73 (ref >59)

## 2024-03-09 ENCOUNTER — Encounter (HOSPITAL_COMMUNITY)

## 2024-03-11 ENCOUNTER — Encounter (HOSPITAL_COMMUNITY)
Admission: RE | Admit: 2024-03-11 | Discharge: 2024-03-11 | Disposition: A | Discharge status: Home / Self Care | Source: Ambulatory Visit | Attending: Cardiology | Admitting: Cardiology

## 2024-03-11 DIAGNOSIS — I70211 Atherosclerosis of native arteries of extremities with intermittent claudication, right leg: Secondary | ICD-10-CM

## 2024-03-11 NOTE — Progress Notes (Signed)
 Daily Session Note  Patient Details  Name: [name] [name] MRN: [id number] Date of Birth: 11-28-1955 Referring Provider:   Conrad Ports Supervised Exercise Therapy from 01/26/2024 in [facility] for [facility], V[facility], & [facility]  Referring Provider Dr. Gordy Schwalbe    Encounter Date: 03/11/2024  Check In:  Session Check In - 03/11/24 1015       Check-In   Supervising physician immediately available to respond to emergencies [facility] MD immediately available    Physician(s) Orren Fabry PA    [facility]    Staff Present Hadassah Quan, RN, Marcine Pereyra, MS, Exercise Physiologist;David Janann, MS, ACSM-CEP, CCRP, Exercise Physiologist;Olinty Valere, MS, ACSM-CEP, Exercise Physiologist;Kioni Stahl Violetta, RN, MHA;Carlette Bernett, RN, BSN;Jetta Walker BS, ACSM-CEP, Exercise Physiologist;Casey Claudene, RT    Virtual Visit No    Medication changes reported     No    Fall or balance concerns reported    No    Tobacco Cessation No Change    Warm-up and Cool-down Performed on first and last piece of equipment    Resistance Training Performed No    VAD Patient? No    PAD/SET Patient? Yes      PAD/SET Patient   Completed foot check today? Yes    Open wounds to report? --   MD following     Pain Assessment   Currently in Pain? No/denies    Pain Score 0-No pain    Multiple Pain Sites No          Capillary Blood Glucose: No results found for this or any previous visit (from the past 24 hours).   Exercise Prescription Changes - 03/11/24 1200       Response to Exercise   Blood Pressure (Admit) 124/60    Blood Pressure (Exercise) 116/68    Blood Pressure (Exit) 114/64    Heart Rate (Admit) 88 bpm    Heart Rate (Exercise) 109 bpm    Heart Rate (Exit) 90 bpm    Oxygen Saturation (Admit) 100 %    Oxygen Saturation (Exercise) 100 %    Oxygen Saturation (Exit) 98 %    Rating of Perceived Exertion (Exercise) 11    Perceived Dyspnea  (Exercise) 0    Symptoms Moderate claudication pain on TM    Duration Progress to 10 minutes continuous walking  at current work load and total walking time to 30-45 min    Intensity THRR unchanged      Progression   Progression Continue to follow PAD protocol      Resistance Training   Training Prescription No      Treadmill   MPH 1.7    Grade 2    Minutes 25    METs 2.8          Social History   Tobacco Use  Smoking Status Some Days   Current packs/day: 0.50   Average packs/day: 0.5 packs/day for 50.6 years (25.3 ttl pk-yrs)   Types: Cigarettes   Start date: 1975  Smokeless Tobacco Never  Tobacco Comments   Cut back to quarter pack a day    Goals Met:  Independence with exercise equipment Exercise tolerated well No report of concerns or symptoms today  Goals Unmet:  Not Applicable  Comments: Pt was cleared for supervised exercise therapy by Dr. Jagadeesh Gangi due to patients hx of CV disease pt is being watched closely and monitored for any unusual signs/symptoms while exercising. Will continue to watch closely, monitor, and  report any unusual or unexpected pain through the program.   Pt exercised on the Treadmill  for a total of 25 minutes and experienced 3-4/5 claudication pain that resolved with sitting rest breaks. Pt tolerated exercise w/o unusual and unexpected symptoms. Will continue to monitor and progress through the program following PAD protocol.    Initial onset of claudication at: 4:00 Longest exercised time before break: 7:23 Total exercise time/rest time: 25/24   Service time is from 1000 to 1130.    Dr. Wilbert Bihari is Medical Director for Cardiac Rehab at [facility].

## 2024-03-14 ENCOUNTER — Encounter (HOSPITAL_COMMUNITY)
Admission: RE | Admit: 2024-03-14 | Discharge: 2024-03-14 | Disposition: A | Discharge status: Home / Self Care | Source: Ambulatory Visit | Attending: Cardiology | Admitting: Cardiology

## 2024-03-14 DIAGNOSIS — I70211 Atherosclerosis of native arteries of extremities with intermittent claudication, right leg: Secondary | ICD-10-CM | POA: Diagnosis not present

## 2024-03-14 NOTE — Progress Notes (Signed)
 Daily Session Note  Patient Details  Name: [name] [name] MRN: [id number] Date of Birth: April 07, 1956 Referring Provider:   Conrad Ports Supervised Exercise Therapy from 01/26/2024 in [facility] for Heart, Vascular, & Lung Health  Referring Provider Dr. Gordy Schwalbe    Encounter Date: 03/14/2024  Check In:  Session Check In - 03/14/24 1102       Check-In   Supervising physician immediately available to respond to emergencies [facility] MD immediately available    Physician(s) Barnie Hila, NP    [facility]    Staff Present Hadassah Quan, RN, Mallory Parkins, MS, ACSM-CEP, CCRP, Exercise Physiologist;Annedrea Violetta, RN, MHA;Jetta Walker BS, ACSM-CEP, Exercise Physiologist;Casey Claudene, RT;Other    Virtual Visit No    Medication changes reported     No    Fall or balance concerns reported    No    Tobacco Cessation No Change    Warm-up and Cool-down Performed on first and last piece of equipment    Resistance Training Performed No    VAD Patient? No    PAD/SET Patient? Yes      PAD/SET Patient   Completed foot check today? Yes    Open wounds to report? Yes   Followed by MD     Pain Assessment   Currently in Pain? No/denies    Pain Score 0-No pain    Multiple Pain Sites No          Capillary Blood Glucose: No results found for this or any previous visit (from the past 24 hours).    Social History   Tobacco Use  Smoking Status Some Days   Current packs/day: 0.50   Average packs/day: 0.5 packs/day for 50.6 years (25.3 ttl pk-yrs)   Types: Cigarettes   Start date: 1975  Smokeless Tobacco Never  Tobacco Comments   Cut back to quarter pack a day    Goals Met:  Independence with exercise equipment Exercise tolerated well No report of concerns or symptoms today Strength training completed today  Goals Unmet:  Not Applicable  Comments: Pt was cleared for supervised exercise therapy by Dr. Erick Schwalbe  Due to patients hx of CV disease pt is being watched closely and monitored for any unusual signs/symptoms while exercising. Will continue to watch closely, monitor, and report any unusual or unexpected pain through the program.  Pt exercised on the Treadmill  for 30 minutes and experienced 3-4/5 claudication pain that resolved with sitting rest breaks. Pt tolerated exercise w/o unusual and unexpected symptoms. Will continue to monitor and progress through the program following PAD protocol.    Initial onset of claudication at: 3:00 Longest exercised time before break: 7:32 Total exercise time/rest time: 30/21  Service time is from 1010 to 1140.   Dr. Wilbert Bihari is Medical Director for Cardiac Rehab at [facility].

## 2024-03-16 ENCOUNTER — Encounter (HOSPITAL_COMMUNITY)
Admission: RE | Admit: 2024-03-16 | Discharge: 2024-03-16 | Disposition: A | Discharge status: Home / Self Care | Source: Ambulatory Visit | Attending: Cardiology | Admitting: Cardiology

## 2024-03-16 ENCOUNTER — Ambulatory Visit: Attending: Cardiology | Admitting: Pharmacist

## 2024-03-16 DIAGNOSIS — I70211 Atherosclerosis of native arteries of extremities with intermittent claudication, right leg: Secondary | ICD-10-CM | POA: Diagnosis not present

## 2024-03-16 DIAGNOSIS — Z720 Tobacco use: Secondary | ICD-10-CM

## 2024-03-16 DIAGNOSIS — T466X5D Adverse effect of antihyperlipidemic and antiarteriosclerotic drugs, subsequent encounter: Secondary | ICD-10-CM | POA: Diagnosis not present

## 2024-03-16 DIAGNOSIS — E7849 Other hyperlipidemia: Secondary | ICD-10-CM

## 2024-03-16 DIAGNOSIS — G720 Drug-induced myopathy: Secondary | ICD-10-CM | POA: Diagnosis not present

## 2024-03-16 NOTE — Progress Notes (Signed)
 Daily Session Note  Patient Details  Name: [name] [name] MRN: [id number] Date of Birth: [birth year] Referring Provider:   Conrad Ports Supervised Exercise Therapy from 01/26/2024 in [facility] for Heart, Vascular, & Lung Health  Referring Provider Dr. Gordy Schwalbe    Encounter Date: 03/16/2024  Check In:  Session Check In - 03/16/24 1045       Check-In   Supervising physician immediately available to respond to emergencies [facility] MD immediately available    Physician(s) Barnie Hila, NP    [facility]    Staff Present Hadassah Quan, RN, Mallory Parkins, MS, ACSM-CEP, CCRP, Exercise Physiologist;Bailey Elnor, MS, Exercise Physiologist;Olinty Valere, MS, ACSM-CEP, Exercise Physiologist;Jilene Spohr Nicholaus, MS, ACSM-CEP, Exercise Physiologist    Virtual Visit No    Medication changes reported     No    Fall or balance concerns reported    No    Tobacco Cessation No Change    Warm-up and Cool-down Performed on first and last piece of equipment    Resistance Training Performed No    VAD Patient? No    PAD/SET Patient? No      PAD/SET Patient   Completed foot check today? Yes    Open wounds to report? Yes   followed by MD     Pain Assessment   Currently in Pain? No/denies    Pain Score 0-No pain    Multiple Pain Sites No          Capillary Blood Glucose: No results found for this or any previous visit (from the past 24 hours).    Social History   Tobacco Use  Smoking Status Some Days   Current packs/day: 0.50   Average packs/day: 0.5 packs/day for 50.6 years (25.3 ttl pk-yrs)   Types: Cigarettes   Start date: 1975  Smokeless Tobacco Never  Tobacco Comments   Cut back to quarter pack a day    Goals Met:  Proper associated with RPD/PD & O2 Sat Independence with exercise equipment Exercise tolerated well No report of concerns or symptoms today  Goals Unmet:  Not Applicable  Comments: Pt was cleared for  supervised exercise therapy by Dr. Jagadeesh Gangi Due to patients hx of CV disease pt is being watched closely and monitored for any unusual signs/symptoms while exercising. Will continue to watch closely, monitor, and report any unusual or unexpected pain through the program.  Pt exercised on the Treadmill  for 29 minutes and experienced 4/5 claudication pain that resolved with sitting rest breaks. Pt tolerated exercise w/o unusual and unexpected symptoms. Will continue to monitor and progress through the program following PAD protocol.    Initial onset of claudication at: 5:00 Longest exercised time before break: 4:20 Total exercise time/rest time: 29/14  Service time is from 1008 to 1114.    Dr. Slater Staff is Medical Director for Pulmonary Rehab at [facility].

## 2024-03-16 NOTE — Progress Notes (Signed)
Patient ID: [name] [name]                 DOB: April 18, 1956                    MRN: [id number]      HPI: [name] [name] is a 68 y.o. female patient referred to lipid clinic by Dr. Ladona. PMH is significant for prior history of tobacco use smoking about half pack of cigarettes a day, primary hypertension, hypercholesterolemia, primary hypercoagulable state with positive lupus anticoagulant with extensive DVT and PE SP endovascular therapy and stenting of the lower extremity venous vasculature and IVC filter placement presently on long-term Xarelto , CAD with LAD and CX stenting in 2020, mildly reduced LVEF at around 45 to 50% on 09/13/2018, severe peripheral arterial disease with moderately reduced bilateral ABI, history of right distal SFA artery stenting on 06/02/2022, history of pseudoaneurysm repair at that time, repeat angiography revealing occluded right popliteal artery and underwent Supera stent placement on 04/07/2023. In view of symptomatic claudication and severe decrease in her ABI, underwent repeat angiogram on 01/12/2024 revealing occluded right SFA stent and popliteal artery stent with one-vessel runoff.   Patient previously seen by Pharm.D. clinic.  She has intolerances to several medications.  Currently on pravastatin  40 mg daily with an LDL-C of 95 although surely this is falsely low due to triglycerides of 244.  Patient presents today to lipid clinic.  She had discussed with her neighbor Repatha  and is willing to try.  We reviewed side effects, cost, injection technique and efficacy.  Current Medications: pravastatin  40mg  daily, fenofibrate  145mg  daily Intolerances: Repatha ,  atorvastatin  40,  pitavastatin  4, rosuvastatin  10, 40 - myalgias bempedoic acid  180 - not sure if she took this evolocumab  140 - never took icoaspent ethyl 2 gm bid, omega 3 acid ethyl esters 2 gm bid, - caused fishy aftertaste Risk Factors: extensive CAD and PAD, HTN, age LDL-C goal: <55 ApoB goal:   Diet: cut  back shrimp intake Vegetables and pasta Pinto beans, PB Rice checks w/ 1 % milk  Exercise: PAD rehab  Family History:  Family History  Problem Relation Age of Onset   Leukemia Mother    Prostate cancer Father    Cancer Father    Healthy Brother    Stroke Maternal Grandmother    Lung cancer Maternal Grandfather    Leukemia Paternal Grandmother    Cancer Paternal Grandfather    Spina bifida Son        Multiple surgeries   Colon cancer Neg Hx    Esophageal cancer Neg Hx     Social History: rare ETOH, trying to quit down to 5 cig per day  Labs: Lipid Panel     Component Value Date/Time   CHOL 187 03/07/2024 1216   TRIG 244 (H) 03/07/2024 1216   TRIG 681 (HH) 10/27/2014 1155   HDL 51 03/07/2024 1216   HDL 29 (L) 10/27/2014 1155   CHOLHDL 3.7 03/07/2024 1216   CHOLHDL 5.7 03/20/2023 0057   VLDL 50 (H) 03/20/2023 0057   LDLCALC 95 03/07/2024 1216   LDLCALC NOTES: 08/25/2013 1612   LDLDIRECT 141.0 11/25/2018 1008   LABVLDL 41 (H) 03/07/2024 1216    Past Medical History:  Diagnosis Date   Back pain    CAD S/P percutaneous coronary angioplasty 09/13/2018   09/13/2018 - Cath & Staged DES PCI on 09/14/2018: DES PCI: Cx99%&55% - Resolute Onyx DES 3.5 x 30 (3.6 mm), p-mLAD (prox of D1  almost to D2) - Resolute Onyx DES 3.0 x 22 (3.3 mm); DFR on pRCA 70% - 0.99, Not significant -> Rec Med Rx.   DVT (deep venous thrombosis) (HCC)    Heart attack (HCC) 09/09/2018   Hypertension    Lupus anticoagulant syndrome (HCC)    NSTEMI (non-ST elevated myocardial infarction) (HCC) 09/12/2018   Initial presentation with chest pain was on 09/09/2018, recurrent pain on 1/26 2020 with positive troponins.  Cath showed three-vessel CAD -> declined CABG, opted for two-vessel DES PCI (LAD and CX, negative DFR RCA)   Pulmonary embolism (HCC)     Current Outpatient Medications on File Prior to Visit  Medication Sig Dispense Refill   acetaminophen  (TYLENOL ) 650 MG CR tablet Take 650 mg by mouth  every 8 (eight) hours as needed for pain.     amLODipine  (NORVASC ) 2.5 MG tablet Take 1 tablet (2.5 mg total) by mouth 2 (two) times daily. 180 tablet 3   bisacodyl  (DULCOLAX) 5 MG EC tablet Take 5 mg by mouth daily as needed for moderate constipation.     carvedilol  (COREG ) 6.25 MG tablet TAKE 0.5 TABLETS (3.125 MG TOTAL) BY MOUTH 2 (TWO) TIMES DAILY WITH A MEAL. 90 tablet 2   cyanocobalamin  (VITAMIN B12) 1000 MCG tablet Take 1,000 mcg by mouth daily.     fenofibrate  (TRICOR ) 145 MG tablet TAKE 1 TABLET (145 MG TOTAL) BY MOUTH DAILY. TAKE WITH A MEAL. 90 tablet 3   lisinopril  (ZESTRIL ) 40 MG tablet TAKE 1 TABLET BY MOUTH EVERY DAY 90 tablet 2   nitroGLYCERIN  (NITROSTAT ) 0.4 MG SL tablet Place 1 tablet (0.4 mg total) under the tongue every 5 (five) minutes as needed for up to 3 doses for chest pain. 25 tablet 3   pravastatin  (PRAVACHOL ) 40 MG tablet Take 1 tablet (40 mg total) by mouth every evening. 90 tablet 3   XARELTO  20 MG TABS tablet TAKE 1 TABLET BY MOUTH DAILY WITH SUPPER. 90 tablet 3   No current facility-administered medications on file prior to visit.    Allergies  Allergen Reactions   Lipitor [Atorvastatin ] Other (See Comments)    Myalgia   Prednisone Other (See Comments)    No energy stalls out.    Makes pt  Jittery.  **makes pt have no energy**   Tramadol Other (See Comments) and Nausea Only    Constipation,  Makes pt  Jittery.  constipation   Crestor  [Rosuvastatin  Calcium ]     Feels fatigued, RE  CHALLENGE - UNABLE TO USE 12/28/18   Hctz [Hydrochlorothiazide ]     Dizzy    Zithromax [Azithromycin] Other (See Comments)    jittery    Assessment/Plan:  1. Hyperlipidemia -  Familial hyperlipidemia Assessment: LDL-C is above goal of less than 55 on pravastatin  40 mg daily and fenofibrate  145 mg daily reviewed side effects, cost, injection technique and efficacy of Repatha  Patient very active and is currently participating in a PAD rehab program She will need  health will grant  Plan: Prior authorization for Repatha  Continue pravastatin  40 mg daily and fenofibrate  145 mg daily Labs in 3 months   Tobacco use Assessment: Patient knows she needs to quit smoking She is trying to quit her way Down to 5 cigarettes/day Not interested in the patch as she has heard bad things about it   Thank you,  Welborn Keena D Cortne Amara, Pharm.[name] [name], CPP [facility] [address] [place], [place] [address]  Phone: [phone]; Fax: [phone]  [phone]    

## 2024-03-16 NOTE — Assessment & Plan Note (Addendum)
 Assessment: Patient knows she needs to quit smoking She is trying to quit her way Down to 5 cigarettes/day Not interested in the patch as she has heard bad things about it

## 2024-03-16 NOTE — Patient Instructions (Signed)

## 2024-03-16 NOTE — Assessment & Plan Note (Signed)
 Assessment: LDL-C is above goal of less than 55 on pravastatin  40 mg daily and fenofibrate  145 mg daily reviewed side effects, cost, injection technique and efficacy of Repatha  Patient very active and is currently participating in a PAD rehab program She will need health will grant  Plan: Prior authorization for Repatha  Continue pravastatin  40 mg daily and fenofibrate  145 mg daily Labs in 3 months

## 2024-03-17 ENCOUNTER — Other Ambulatory Visit (HOSPITAL_BASED_OUTPATIENT_CLINIC_OR_DEPARTMENT_OTHER): Payer: Self-pay

## 2024-03-17 ENCOUNTER — Telehealth: Payer: Self-pay | Admitting: Pharmacy Technician

## 2024-03-17 ENCOUNTER — Other Ambulatory Visit (HOSPITAL_COMMUNITY): Payer: Self-pay

## 2024-03-17 ENCOUNTER — Telehealth: Payer: Self-pay

## 2024-03-17 DIAGNOSIS — I2510 Atherosclerotic heart disease of native coronary artery without angina pectoris: Secondary | ICD-10-CM

## 2024-03-17 DIAGNOSIS — E782 Mixed hyperlipidemia: Secondary | ICD-10-CM

## 2024-03-17 MED ORDER — REPATHA SURECLICK 140 MG/ML ~~LOC~~ SOAJ
1.0000 mL | SUBCUTANEOUS | 3 refills | Status: AC
  Filled 2024-03-17: qty 6, 84d supply, fill #0
  Filled 2024-06-08: qty 6, 84d supply, fill #1
  Filled 2024-08-31: qty 6, 84d supply, fill #2

## 2024-03-17 NOTE — Addendum Note (Signed)
 Addended by: Ellionna Buckbee D on: 03/17/2024 04:03 PM   Modules accepted: Orders

## 2024-03-17 NOTE — Telephone Encounter (Signed)
 Team, please get her a healthwell grant and add info to [facility] and mark for delivery. Thanks

## 2024-03-17 NOTE — Telephone Encounter (Signed)
 Pharmacy Patient Advocate Encounter  Received notification from [facility] that Prior Authorization for Repatha  has been APPROVED from 03/17/24 to 09/17/24. Ran test claim, Copay is $47.00- one month. This test claim was processed through [facility]- copay amounts may vary at other pharmacies due to pharmacy/plan contracts, or as the patient moves through the different stages of their insurance plan.   PA #/Case ID/Reference #: [id number]

## 2024-03-17 NOTE — Telephone Encounter (Signed)
 Patient Advocate Encounter   The patient was approved for a Healthwell grant that will help cover the cost of REPATHA  Total amount awarded, $2,500.  Effective: 02/16/24 - 02/14/25   APW:[id number] ERW:[id number] Hmnle:[id number] PI:[id number]   Pharmacy provided with approval and processing information.  Ileana Lehmann, CPhT  Pharmacy Patient Advocate Specialist  Direct Number: [phone] Fax: [phone]

## 2024-03-17 NOTE — Telephone Encounter (Signed)
 Patient enrolled in grant. Info has been added into [facility] and pt is flagged for delivery.

## 2024-03-17 NOTE — Telephone Encounter (Signed)
Rx sent  Mychart message sent

## 2024-03-17 NOTE — Telephone Encounter (Signed)
 Pharmacy Patient Advocate Encounter   Received notification from Physician's Office [facility]) that prior authorization for Repatha  is required/requested.   Insurance verification completed.   The patient is insured through [employer] .   Per test claim: PA required; PA submitted to above mentioned insurance via CoverMyMeds Key/confirmation #/EOC [id number] Status is pending

## 2024-03-18 ENCOUNTER — Encounter (HOSPITAL_COMMUNITY)
Admission: RE | Admit: 2024-03-18 | Discharge: 2024-03-18 | Disposition: A | Discharge status: Home / Self Care | Source: Ambulatory Visit | Attending: Cardiology | Admitting: Cardiology

## 2024-03-18 DIAGNOSIS — I70211 Atherosclerosis of native arteries of extremities with intermittent claudication, right leg: Secondary | ICD-10-CM | POA: Insufficient documentation

## 2024-03-18 NOTE — Progress Notes (Signed)
 Daily Session Note  Patient Details  Name: [name] [name] MRN: [id number] Date of Birth: 13-Oct-1955 Referring Provider:   Conrad Ports Supervised Exercise Therapy from 01/26/2024 in [facility] for Heart, Vascular, & Lung Health  Referring Provider Dr. Gordy Schwalbe    Encounter Date: 03/18/2024  Check In:  Session Check In - 03/18/24 1114       Check-In   Supervising physician immediately available to respond to emergencies [facility] MD immediately available    Physician(s) Josefa Beauvais, NP    Location [facility]    Staff Present Hadassah Quan, RN, Avonne Gal, MS, ACSM-CEP, Exercise Physiologist;Jakayla Schweppe Claudene, Nicolasa Parkins, MS, ACSM-CEP, CCRP, Exercise Physiologist;Jetta Walker BS, ACSM-CEP, Exercise Physiologist    Virtual Visit No    Medication changes reported     No    Fall or balance concerns reported    No    Tobacco Cessation No Change    Warm-up and Cool-down Performed on first and last piece of equipment    Resistance Training Performed No    VAD Patient? No    PAD/SET Patient? No      PAD/SET Patient   Completed foot check today? Yes    Open wounds to report? Yes      Pain Assessment   Currently in Pain? No/denies    Pain Score 0-No pain    Multiple Pain Sites No          Capillary Blood Glucose: No results found for this or any previous visit (from the past 24 hours).    Social History   Tobacco Use  Smoking Status Some Days   Current packs/day: 0.50   Average packs/day: 0.5 packs/day for 50.6 years (25.3 ttl pk-yrs)   Types: Cigarettes   Start date: 1975  Smokeless Tobacco Never  Tobacco Comments   Cut back to quarter pack a day    Goals Met:  Independence with exercise equipment Exercise tolerated well No report of concerns or symptoms today Strength training completed today  Goals Unmet:  Not Applicable  Comments: Pt was cleared for supervised exercise therapy by Dr. Erick Schwalbe  Due to patients hx of CV disease pt is being watched closely and monitored for any unusual signs/symptoms while exercising. Will continue to watch closely, monitor, and report any unusual or unexpected pain through the program.   Pt exercised on the Treadmill  for 30 minutes and experienced 3-4/5 claudication pain that resolved with sitting rest breaks. Pt tolerated exercise w/o unusual and unexpected symptoms. Will continue to monitor and progress through the program following PAD protocol.    Initial onset of claudication at: 3:00 Longest exercised time before break: 7:31 Total exercise time/rest time: 22/16   Service time is from 1010 to 1140.    Dr. Wilbert Bihari is Medical Director for Cardiac Rehab at [facility]

## 2024-03-19 DIAGNOSIS — H10023 Other mucopurulent conjunctivitis, bilateral: Secondary | ICD-10-CM | POA: Diagnosis not present

## 2024-03-21 ENCOUNTER — Telehealth (HOSPITAL_COMMUNITY): Payer: Self-pay

## 2024-03-21 ENCOUNTER — Encounter (HOSPITAL_COMMUNITY): Admission: RE | Admit: 2024-03-21 | Source: Ambulatory Visit

## 2024-03-21 ENCOUNTER — Encounter (HOSPITAL_COMMUNITY)
Admission: RE | Admit: 2024-03-21 | Discharge: 2024-03-21 | Disposition: A | Discharge status: Home / Self Care | Source: Ambulatory Visit | Attending: Cardiology | Admitting: Cardiology

## 2024-03-21 NOTE — Telephone Encounter (Signed)
 Patient c/o for 10:15 SET class, states she is having an issue with her well and is waiting for a plumber.

## 2024-03-23 ENCOUNTER — Encounter (HOSPITAL_COMMUNITY)
Admission: RE | Admit: 2024-03-23 | Discharge: 2024-03-23 | Disposition: A | Discharge status: Home / Self Care | Source: Ambulatory Visit | Attending: Cardiology | Admitting: Cardiology

## 2024-03-23 DIAGNOSIS — I70211 Atherosclerosis of native arteries of extremities with intermittent claudication, right leg: Secondary | ICD-10-CM | POA: Diagnosis not present

## 2024-03-23 MED ORDER — ENOXAPARIN SODIUM 100 MG/ML IJ SOSY: 100.0000 mg | PREFILLED_SYRINGE | INTRAMUSCULAR | 0 refills | Status: DC

## 2024-03-23 NOTE — Telephone Encounter (Signed)
-----   Message from Nurse Candance H sent at 03/23/2024 11:15 AM EDT ----- Regarding: FW: LEA 03/31/24 -lovenox  bridging  ----- Message ----- From: Lajoyce Arlean HERO, RN Sent: 03/23/2024  10:09 AM EDT To: Arlean HERO Lajoyce, RN; Avelina LITTIE Felix, RN;# Subject: LEA 03/31/24 -lovenox  bridging                  Hi Everyone,  Lower Extremity Angiogram 03/31/24  Pt takes Xarelto -per Dr Ladona Office Visit note 03/02/24 She will need Lovenox  bridging prior to the procedure.  I just wanted to make sure you were aware she need bridging prior to LEA 03/31/24.   Thanks, Arlean

## 2024-03-23 NOTE — Progress Notes (Signed)
 Daily Session Note  Patient Details  Name: [name] [name] MRN: [id number] Date of Birth: 1955-09-11 Referring Provider:   Conrad Ports Supervised Exercise Therapy from 01/26/2024 in [facility] for Heart, Vascular, & Lung Health  Referring Provider Dr. Gordy Schwalbe    Encounter Date: 03/23/2024  Check In:  Session Check In - 03/23/24 1122       Check-In   Supervising physician immediately available to respond to emergencies [facility] MD immediately available    Physician(s) Orren Fabry, PA    Location [facility]    Staff Present Johnnie Moats, MS, ACSM-CEP, Exercise Physiologist;Olinty Valere, MS, ACSM-CEP, Exercise Physiologist;Maria Whitaker, RN, BSN    Virtual Visit No    Medication changes reported     No    Fall or balance concerns reported    No    Tobacco Cessation No Change    Warm-up and Cool-down Performed on first and last piece of equipment    Resistance Training Performed No    VAD Patient? No    PAD/SET Patient? Yes      PAD/SET Patient   Completed foot check today? Yes    Open wounds to report? No      Pain Assessment   Currently in Pain? No/denies    Pain Score 0-No pain    Multiple Pain Sites No          Capillary Blood Glucose: No results found for this or any previous visit (from the past 24 hours).    Social History   Tobacco Use  Smoking Status Some Days   Current packs/day: 0.50   Average packs/day: 0.5 packs/day for 50.6 years (25.3 ttl pk-yrs)   Types: Cigarettes   Start date: 1975  Smokeless Tobacco Never  Tobacco Comments   Cut back to quarter pack a day    Goals Met:  Proper associated with RPD/PD & O2 Sat Independence with exercise equipment Exercise tolerated well No report of concerns or symptoms today  Goals Unmet:  Not Applicable  Comments: Pt was cleared for supervised exercise therapy by Dr. Jagadeesh Gangi Due to patients hx of CV disease pt is being watched closely and monitored  for any unusual signs/symptoms while exercising. Will continue to watch closely, monitor, and report any unusual or unexpected pain through the program.  Pt exercised on the Treadmill  for 27 minutes and experienced 4/5 claudication pain that resolved with sitting rest breaks. Pt tolerated exercise w/o unusual and unexpected symptoms. Will continue to monitor and progress through the program following PAD protocol.    Initial onset of claudication at: 4:30 Longest exercised time before break: 7:07 Total exercise time/rest time: 27/20  Service time is from 1000 to 1116.    Dr. Wilbert Bihari is Medical Director for Cardiac Rehab at [facility].

## 2024-03-23 NOTE — Telephone Encounter (Signed)
 Schedule for Xarelto  hold/Lovenox  bridge  8/10: Last dose of Xarelto     8/11: Inject enoxaparin  100mg   in the fatty tissue in the evening; no Xarelto    8/12: Inject enoxaparin  100mg  in the fatty tissue in the evening; no Xarelto    8/13: No enoxaparin ,  no Xarelto    8/14: Procedure Day - No enoxaparin  - Resume Xarelto  in the evening or as directed by physician  [facility] for patient to check her MyChart account for above bridge information.  She has done this in the past, most recently in May of this year.    Prescription for enoxaparin  sent to [facility].

## 2024-03-24 NOTE — Progress Notes (Signed)
 PAD/SET Plan of Care Updates  Patient Details  Name: [name] [name] MRN: [id number] Date of Birth: [birth year]  Entry Date:  Flowsheet Row Supervised Exercise Therapy from 01/26/2024 in [facility] for Heart, Vascular, & Lung Health  Date 01/26/24   Expected Discharge Date:  Flowsheet Row Supervised Exercise Therapy from 01/26/2024 in [facility] for Heart, Vascular, & Lung Health  Expected Discharge Date 04/18/24    Medical Diagnosis: Atherosclerosis of native arteries of extremities with intermittent claudication, right leg [facility])  Encounter Date: 03/24/24  Exercise Target Goals: Exercise Program Goal: Individual exercise prescription set with THRR, safety & activity barriers. Participant demonstrates ability to understand and report RPE using RPE 6-20 scale, to self-measure pulse accurately, and to acknowledge the importance of the exercise prescription.  Exercise Prescription Goal: Use initial exercise assessment to set exercise prescription to improve time and distance to onset of claudication pain. Provide education to aid in steps toward risk factor modification, improve quality of life with claudication, and utilize THRR and exercise prescription for best results for decreasing symptoms of PAD. Prevent injury to bone, joints, and skin integrity during the exercise through checks of each system during session check in. Following physician guidelines for any contraindications to specific exercises.  Activity Barriers:  Activity Barriers & Cardiac Risk Stratification - 01/26/24 1216       Activity Barriers & Cardiac Risk Stratification   Activity Barriers None;Other (comment)    Comments PAD    Cardiac Risk Stratification High          Initial Exercise Prescription:  Initial Exercise Prescription - 01/26/24 1200       Date of Initial Exercise RX and Referring Provider   Date 01/26/24    Referring Provider Dr. Gordy Schwalbe     Expected Discharge Date 04/18/24      Treadmill   MPH 1.7    Grade 2    Minutes 25    METs 2.8      Prescription Details   Frequency (times per week) 3 days/week      Intensity   THRR 40-80% of Max Heartrate 61-122    Ratings of Perceived Exertion 11-13    Perceived Dyspnea 0-4      Progression   Progression Continue to follow PAD protocol      Resistance Training   Training Prescription Yes    Weight 3    Reps 10-15          Perform Capillary Blood Glucose checks as needed.  Current Exercise Prescription:   Exercise Prescription Changes     Row Name 01/27/24 1200 02/10/24 1100 03/07/24 1100 03/11/24 1200 03/14/24 1100     Response to Exercise   Blood Pressure (Admit) 140/62 118/62 148/70 124/60 140/60   Blood Pressure (Exercise) 130/62 110/60 132/70 116/68 --   Blood Pressure (Exit) 112/52 92/52 106/60 114/64 100/50   Heart Rate (Admit) 80 bpm 90 bpm 92 bpm 88 bpm 85 bpm   Heart Rate (Exercise) 103 bpm 115 bpm 114 bpm 109 bpm 108 bpm   Heart Rate (Exit) 85 bpm 94 bpm 98 bpm 90 bpm 92 bpm   Oxygen Saturation (Admit) 100 % 99 % 100 % 100 % 100 %   Oxygen Saturation (Exercise) 100 % 98 % 98 % 100 % 99 %   Oxygen Saturation (Exit) 100 % 100 % 98 % 98 % 99 %   Rating of Perceived Exertion (Exercise) 12 14 14 11  14  Perceived Dyspnea (Exercise) 0 0 0 0 0   Symptoms Moderate claudication pain on TM Moderate claudication pain on TM Moderate claudication pain on TM Moderate claudication pain on TM Moderate claudication pain on TM   Comments Pt first day in program -- -- -- --   Duration Progress to 10 minutes continuous walking  at current work load and total walking time to 30-45 min Progress to 10 minutes continuous walking  at current work load and total walking time to 30-45 min Progress to 10 minutes continuous walking  at current work load and total walking time to 30-45 min Progress to 10 minutes continuous walking  at current work load and total walking time to  30-45 min Progress to 10 minutes continuous walking  at current work load and total walking time to 30-45 min   Intensity THRR unchanged THRR unchanged THRR unchanged THRR unchanged THRR unchanged     Progression   Progression Continue to follow PAD protocol Continue to follow PAD protocol Continue to follow PAD protocol Continue to follow PAD protocol Continue to follow PAD protocol   Average METs 2.8 2.8 2.8 -- 3     Resistance Training   Training Prescription --  No weights on Wednesday --  No weights today Yes No Yes   Weight -- -- 3 -- 3   Reps -- -- 10-15 -- 10-15   Time -- -- -- -- 10 Minutes     Treadmill   MPH 1.7 1.7 1.7 1.7 1.7   Grade 2 2 2 2  2.5   Minutes 22  22-23 minutes of walking time on TM. 8 minute warm-up on TM 1.3/1.0 27  5 min warmup included 31  5 min warmup included 25 31   METs 2.8 2.8 2.8 2.8 3      Exercise Comments:   Exercise Comments     Row Name 01/26/24 1220 01/27/24 1214         Exercise Comments Pt SET orenitation. pt tolerated TM test well w/o unusual signs and symptoms. Pt plans to return tomorrow for first day of exercise. Pt first day in program, pt longest walk today was 6 minutes and 3 seconds. She walked 22 minutes and rested for 17 minutes. Will continue to monitor progress and adjust WL accordingly.         Program and Patient Goals Exercise Goals and Review:   Exercise Goals     Row Name 01/26/24 1219             Exercise Goals   Increase Physical Activity Yes       Intervention Provide advice, education, support and counseling about physical activity/exercise needs.;Develop an individualized exercise prescription for aerobic and resistive training based on initial evaluation findings, risk stratification, comorbidities and participant's personal goals.       Expected Outcomes Short Term: Attend rehab on a regular basis to increase amount of physical activity.;Long Term: Add in home exercise to make exercise part of routine  and to increase amount of physical activity.;Long Term: Exercising regularly at least 3-5 days a week.       Increase Strength and Stamina Yes       Intervention Provide advice, education, support and counseling about physical activity/exercise needs.;Develop an individualized exercise prescription for aerobic and resistive training based on initial evaluation findings, risk stratification, comorbidities and participant's personal goals.       Expected Outcomes Short Term: Increase workloads from initial exercise prescription for resistance, speed, and METs.;Long  Term: Improve cardiorespiratory fitness, muscular endurance and strength as measured by increased METs and functional capacity ( );Short Term: Perform resistance training exercises routinely during rehab and add in resistance training at home       Able to understand and use rate of perceived exertion (RPE) scale Yes       Intervention Provide education and explanation on how to use RPE scale       Expected Outcomes Short Term: Able to use RPE daily in rehab to express subjective intensity level;Long Term:  Able to use RPE to guide intensity level when exercising independently       Knowledge and understanding of Target Heart Rate Range (THRR) Yes       Intervention Provide education and explanation of THRR including how the numbers were predicted and where they are located for reference       Expected Outcomes Short Term: Able to state/look up THRR;Long Term: Able to use THRR to govern intensity when exercising independently;Short Term: Able to use daily as guideline for intensity in rehab       Understanding of Exercise Prescription Yes       Intervention Provide education, explanation, and written materials on patient's individual exercise prescription       Expected Outcomes Short Term: Able to explain program exercise prescription;Long Term: Able to explain home exercise prescription to exercise independently       Improve claudication  pain toleration; Improve walking ability Yes       Intervention Participate in PAD/SET Rehab 2-3 days a week, walking at home as part of exercise prescription;Attend education sessions to aid in risk factor modification and understanding of disease process       Expected Outcomes Long Term: Improve walking ability and toleration to claudication;Long Term: Improve score of PAD questionnaires;Short Term: Improve walking distance/time to onset of claudication pain          Nutrition:  Target Goals: Understanding of nutrition guidelines, daily intake of sodium 1500mg , cholesterol 200mg , calories 30% from fat and 7% or less from saturated fats, daily to have 5 or more servings of fruits and vegetables.  Biometrics:  Pre Biometrics - 01/26/24 1032       Pre Biometrics   Waist Circumference 37.5 inches    Hip Circumference 38.5 inches    Waist to Hip Ratio 0.97 %    Triceps Skinfold 19 mm    % Body Fat 35.7 %    Grip Strength 14 kg    Flexibility 9.5 in    Single Leg Stand 3.68 seconds           Nutrition Therapy Plan and Nutrition Goals:   Core Components/Risk Factors/Patient Goals at Admission:  Personal Goals and Risk Factors at Admission - 01/26/24 1227       Core Components/Risk Factors/Patient Goals on Admission    Weight Management Weight Loss    Tobacco Cessation Yes    Intervention Assist the participant in steps to quit. Provide individualized education and counseling about committing to Tobacco Cessation, relapse prevention, and pharmacological support that can be provided by physician.;Education officer, environmental, assist with locating and accessing local/national Quit Smoking programs, and support quit date choice.    Expected Outcomes Short Term: Will demonstrate readiness to quit, by selecting a quit date.;Long Term: Complete abstinence from all tobacco products for at least 12 months from quit date.;Short Term: Will quit all tobacco product use, adhering to  prevention of relapse plan.    Hypertension Yes  Intervention Provide education on lifestyle modifcations including regular physical activity/exercise, weight management, moderate sodium restriction and increased consumption of fresh fruit, vegetables, and low fat dairy, alcohol moderation, and smoking cessation.;Monitor prescription use compliance.    Expected Outcomes Short Term: Continued assessment and intervention until BP is < 140/4mm HG in hypertensive participants. < 130/31mm HG in hypertensive participants with diabetes, heart failure or chronic kidney disease.;Long Term: Maintenance of blood pressure at goal levels.    Lipids Yes    Intervention Provide education and support for participant on nutrition & aerobic/resistive exercise along with prescribed medications to achieve LDL 70mg , HDL >40mg .    Expected Outcomes Short Term: Participant states understanding of desired cholesterol values and is compliant with medications prescribed. Participant is following exercise prescription and nutrition guidelines.;Long Term: Cholesterol controlled with medications as prescribed, with individualized exercise RX and with personalized nutrition plan. Value goals: LDL < 70mg , HDL > 40 mg.          ITP Comments:  ITP Comments     Row Name 01/26/24 1153           ITP Comments Dr. Ladona Referring/Supervising SET physician.          Comments: Over the last 30 days pt has tolerated an increase in her incline and speed on the treadmill. She is accumulating at least 30 minutes of walking on the TM every session and she is able to walk longer without stopping. Progress has been slow but her claudication is slowly improving. I believe she will benefit from another 30 days of exercise.   Physician Documentation: Your signature/cosign is required to indicate approval of the Care Plan as stated above. By signing this report, you are approving the Plan of Care. Please sign and either send  electronically or print and fax the signed copy to the number below. Please indicate any changes or limitations in the space provided.   ____________________________________________________________________________________________________________________________________________  Physician Signature: _____________________________________________________ Print Physician Name: ____________________________________________________ Date: ___________________________ Time: _________________________________  [facility]  [place]. [facility] [place] [facility]. [facility] for Heart, Vascular and Pulmonary Health [address] [place], [place] [address] Phone: [phone] Fax: [phone]

## 2024-03-25 ENCOUNTER — Encounter (HOSPITAL_COMMUNITY)
Admission: RE | Admit: 2024-03-25 | Discharge: 2024-03-25 | Disposition: A | Discharge status: Home / Self Care | Source: Ambulatory Visit | Attending: Cardiology | Admitting: Cardiology

## 2024-03-25 DIAGNOSIS — I70211 Atherosclerosis of native arteries of extremities with intermittent claudication, right leg: Secondary | ICD-10-CM | POA: Diagnosis not present

## 2024-03-25 NOTE — Progress Notes (Signed)
 Daily Session Note  Patient Details  Name: [name] [name] MRN: [id number] Date of Birth: 01-02-1956 Referring Provider:   Conrad Ports Supervised Exercise Therapy from 01/26/2024 in [facility] for Heart, Vascular, & Lung Health  Referring Provider Dr. Gordy Schwalbe    Encounter Date: 03/25/2024  Check In:  Session Check In - 03/25/24 1217       Check-In   Supervising physician immediately available to respond to emergencies [facility] MD immediately available    Physician(s) Orren Fabry, PA    Location [facility]    Staff Present Arnoldo Gal, MS, ACSM-CEP, Exercise Physiologist;Maria Whitaker, RN, BSN;Natsuko Kelsay Fayette, MS, Exercise Physiologist;Bailey Elnor, MS, Exercise Physiologist;Jetta Vannie BS, ACSM-CEP, Exercise Physiologist;Joseph Lennon, RN, Mallory Parkins, MS, ACSM-CEP, CCRP, Exercise Physiologist    Virtual Visit No    Medication changes reported     No    Fall or balance concerns reported    No    Tobacco Cessation No Change    Warm-up and Cool-down Performed on first and last piece of equipment    Resistance Training Performed Yes    VAD Patient? No    PAD/SET Patient? Yes      PAD/SET Patient   Completed foot check today? Yes    Open wounds to report? No      Pain Assessment   Currently in Pain? No/denies    Pain Score 0-No pain    Multiple Pain Sites No          Capillary Blood Glucose: No results found for this or any previous visit (from the past 24 hours).    Social History   Tobacco Use  Smoking Status Some Days   Current packs/day: 0.50   Average packs/day: 0.5 packs/day for 50.6 years (25.3 ttl pk-yrs)   Types: Cigarettes   Start date: 1975  Smokeless Tobacco Never  Tobacco Comments   Cut back to quarter pack a day    Goals Met:  Independence with exercise equipment Exercise tolerated well No report of concerns or symptoms today Strength training completed today  Goals Unmet:  Not  Applicable  Comments: Pt was cleared for supervised exercise therapy by Dr. Erick Schwalbe Due to patients hx of CV disease pt is being watched closely and monitored for any unusual signs/symptoms while exercising. Will continue to watch closely, monitor, and report any unusual or unexpected pain through the program.  Pt exercised on the Treadmill  for 26 minutes and experienced 3/5 claudication pain that resolved with sitting rest breaks. Pt tolerated exercise w/o unusual and unexpected symptoms. Will continue to monitor and progress through the program following PAD protocol.    Initial onset of claudication at: 4:44 Longest exercised time before break: 5:31 Total exercise time/rest time: 26/22  Service time is from 1015 to 1130.    Dr. Wilbert Bihari is Medical Director for Cardiac Rehab at [facility].

## 2024-03-28 ENCOUNTER — Encounter (HOSPITAL_COMMUNITY)
Admission: RE | Admit: 2024-03-28 | Discharge: 2024-03-28 | Disposition: A | Discharge status: Home / Self Care | Source: Ambulatory Visit | Attending: Cardiology | Admitting: Cardiology

## 2024-03-28 DIAGNOSIS — I70211 Atherosclerosis of native arteries of extremities with intermittent claudication, right leg: Secondary | ICD-10-CM

## 2024-03-28 NOTE — Progress Notes (Signed)
 Daily Session Note  Patient Details  Name: [name] [name] MRN: [id number] Date of Birth: 1955/12/09 Referring Provider:   Conrad Ports Supervised Exercise Therapy from 01/26/2024 in [facility]  Referring Provider Dr. Gordy Schwalbe    Encounter Date: 03/28/2024  Check In:  Session Check In - 03/28/24 1135       Check-In   Supervising physician immediately available to respond to emergencies [facility] MD immediately available    Physician(s) Katlyn West,NP    Location [facility]    Staff Present Arnoldo Gal, MS, ACSM-CEP, Exercise Physiologist;Maria Whitaker, RN, Mallory Parkins, MS, ACSM-CEP, CCRP, Exercise Physiologist;Kahleel Fadeley Claudene, RT    Virtual Visit No    Medication changes reported     No    Fall or balance concerns reported    No    Tobacco Cessation No Change    Warm-up and Cool-down Performed as group-led instruction    Resistance Training Performed Yes    VAD Patient? No    PAD/SET Patient? No      PAD/SET Patient   Completed foot check today? No    Open wounds to report? No      Pain Assessment   Currently in Pain? No/denies    Pain Score 0-No pain    Multiple Pain Sites No          Capillary Blood Glucose: No results found for this or any previous visit (from the past 24 hours).    Social History   Tobacco Use  Smoking Status Some Days   Current packs/day: 0.50   Average packs/day: 0.5 packs/day for 50.6 years (25.3 ttl pk-yrs)   Types: Cigarettes   Start date: 1975  Smokeless Tobacco Never  Tobacco Comments   Cut back to quarter pack a day    Goals Met:  Independence with exercise equipment Exercise tolerated well No report of concerns or symptoms today Strength training completed today  Goals Unmet:  Not Applicable  Comments: Pt was cleared for supervised exercise therapy by Dr. Erick Schwalbe Due to patients hx of CV disease pt is being watched closely and  monitored for any unusual signs/symptoms while exercising. Will continue to watch closely, monitor, and report any unusual or unexpected pain through the program.   Pt exercised on the Treadmill  for 26 minutes and experienced 3/5 claudication pain that resolved with sitting rest breaks. Pt tolerated exercise w/o unusual and unexpected symptoms. Will continue to monitor and progress through the program following PAD protocol.    Initial onset of claudication at: 4:00 Longest exercised time before break: 6:06 Total exercise time/rest time: 26/22   Service time is from 1015 to 1130.   Dr. Wilbert Bihari is Medical Director for Cardiac Rehab at [facility].

## 2024-03-29 ENCOUNTER — Telehealth: Payer: Self-pay | Admitting: *Deleted

## 2024-03-29 NOTE — Telephone Encounter (Addendum)
 LEA  scheduled at [facility] for: Thursday March 31, 2024 1 PM Arrival time [facility] Main Entrance A at: 11 AM  Diet: -May have light meal until 7 AM. (6 hours before procedure time) Approved light meal consists of plain toast, fruit, light soups, crackers.  Hydration: -May drink clear liquids until leaving for hospital. Approved liquids: Water, clear tea, black coffee, fruit juices-non-citric and without pulp,Gatorade, plain Jello/popsicles.  Drink 16 oz. bottle of water on the way to the hospital.   Medication instructions: -Hold:  Xarelto /Lovenox   bridge-see Pharmacist Note 03/23/24 with instructions  Patient tells me she is following instructions/no questions. -Other usual morning medications can be taken including aspirin  81 mg.  Plan to go home the same day, you will only stay overnight if medically necessary.  You must have responsible adult to drive you home.  Someone must be with you the first 24 hours after you arrive home.  Reviewed procedure instructions with patient.

## 2024-03-30 ENCOUNTER — Encounter (HOSPITAL_COMMUNITY)
Admission: RE | Admit: 2024-03-30 | Discharge: 2024-03-30 | Disposition: A | Discharge status: Home / Self Care | Source: Ambulatory Visit | Attending: Cardiology | Admitting: Cardiology

## 2024-03-30 DIAGNOSIS — I70211 Atherosclerosis of native arteries of extremities with intermittent claudication, right leg: Secondary | ICD-10-CM

## 2024-03-30 NOTE — Progress Notes (Signed)
 Daily Session Note  Patient Details  Name: [name] [name] MRN: [id number] Date of Birth: [birth year] Referring Provider:   Conrad Ports Supervised Exercise Therapy from 01/26/2024 in [facility]  Referring Provider Dr. Gordy Schwalbe    Encounter Date: 03/30/2024  Check In:  Session Check In - 03/30/24 1045       Check-In   Supervising physician immediately available to respond to emergencies [facility] MD immediately available    Physician(s) Katlyn West,NP    Location [facility]    Staff Present Hadassah Quan, RN, Maud Moats, MS, ACSM-CEP, Exercise Physiologist    Virtual Visit No    Medication changes reported     No    Fall or balance concerns reported    No    Tobacco Cessation No Change    Warm-up and Cool-down Performed on first and last piece of equipment    Resistance Training Performed No    VAD Patient? No    PAD/SET Patient? Yes      PAD/SET Patient   Completed foot check today? Yes    Open wounds to report? Yes      Pain Assessment   Currently in Pain? No/denies    Pain Score 0-No pain    Multiple Pain Sites No          Capillary Blood Glucose: No results found for this or any previous visit (from the past 24 hours).   Exercise Prescription Changes - 03/30/24 1100       Response to Exercise   Blood Pressure (Admit) 146/60    Blood Pressure (Exercise) 130/60    Blood Pressure (Exit) 120/60    Heart Rate (Admit) 85 bpm    Heart Rate (Exercise) 105 bpm    Heart Rate (Exit) 93 bpm    Oxygen Saturation (Admit) 99 %    Oxygen Saturation (Exercise) 100 %    Oxygen Saturation (Exit) 99 %    Rating of Perceived Exertion (Exercise) 13    Perceived Dyspnea (Exercise) 0    Symptoms Moderate claudication pain on TM    Duration Progress to 10 minutes continuous walking  at current work load and total walking time to 30-45 min    Intensity THRR unchanged      Progression   Progression  Continue to follow PAD protocol    Average METs 3      Resistance Training   Training Prescription No      Treadmill   MPH 1.8    Grade 2.5    Minutes 27    METs 3.1          Social History   Tobacco Use  Smoking Status Some Days   Current packs/day: 0.50   Average packs/day: 0.5 packs/day for 50.6 years (25.3 ttl pk-yrs)   Types: Cigarettes   Start date: 1975  Smokeless Tobacco Never  Tobacco Comments   Cut back to quarter pack a day    Goals Met:  Proper associated with RPD/PD & O2 Sat Independence with exercise equipment Exercise tolerated well No report of concerns or symptoms today Strength training completed today  Goals Unmet:  Not Applicable  Comments: Pt was cleared for supervised exercise therapy by Dr. Gordy Bergamo. Due to patients hx of CV disease pt is being watched closely and monitored for any unusual signs/symptoms while exercising. Will continue to watch closely, monitor, and report any unusual or unexpected pain through the program.  Pt exercised on the Treadmill  for 27 minutes and experienced 4/5 claudication pain that resolved with sitting rest breaks. Pt tolerated exercise w/o unusual and unexpected symptoms. Will continue to monitor and progress through the program following PAD protocol.    Initial onset of claudication at: 4:30 Longest exercised time before break: 6:00 Total exercise time/rest time: 27/16  Service time is from 1001 to 1120.   Dr. Wilbert Bihari is Medical Director for Cardiac Rehab at [facility].

## 2024-03-31 ENCOUNTER — Other Ambulatory Visit (HOSPITAL_COMMUNITY): Payer: Self-pay

## 2024-03-31 ENCOUNTER — Encounter (HOSPITAL_COMMUNITY)
Admission: RE | Disposition: A | Discharge status: Home / Self Care | Payer: Self-pay | Source: Home / Self Care | Attending: Cardiology

## 2024-03-31 ENCOUNTER — Other Ambulatory Visit: Payer: Self-pay

## 2024-03-31 ENCOUNTER — Observation Stay (HOSPITAL_COMMUNITY)
Admission: RE | Admit: 2024-03-31 | Discharge: 2024-04-01 | Disposition: A | Discharge status: Home / Self Care | Attending: Cardiology | Admitting: Cardiology

## 2024-03-31 DIAGNOSIS — Z86711 Personal history of pulmonary embolism: Secondary | ICD-10-CM | POA: Insufficient documentation

## 2024-03-31 DIAGNOSIS — F17210 Nicotine dependence, cigarettes, uncomplicated: Secondary | ICD-10-CM | POA: Diagnosis not present

## 2024-03-31 DIAGNOSIS — B351 Tinea unguium: Secondary | ICD-10-CM | POA: Diagnosis not present

## 2024-03-31 DIAGNOSIS — E782 Mixed hyperlipidemia: Secondary | ICD-10-CM | POA: Insufficient documentation

## 2024-03-31 DIAGNOSIS — D6859 Other primary thrombophilia: Secondary | ICD-10-CM | POA: Diagnosis not present

## 2024-03-31 DIAGNOSIS — Z8249 Family history of ischemic heart disease and other diseases of the circulatory system: Secondary | ICD-10-CM | POA: Insufficient documentation

## 2024-03-31 DIAGNOSIS — I739 Peripheral vascular disease, unspecified: Principal | ICD-10-CM | POA: Diagnosis present

## 2024-03-31 DIAGNOSIS — I70211 Atherosclerosis of native arteries of extremities with intermittent claudication, right leg: Secondary | ICD-10-CM

## 2024-03-31 DIAGNOSIS — Z79899 Other long term (current) drug therapy: Secondary | ICD-10-CM | POA: Insufficient documentation

## 2024-03-31 DIAGNOSIS — Z86718 Personal history of other venous thrombosis and embolism: Secondary | ICD-10-CM | POA: Diagnosis not present

## 2024-03-31 DIAGNOSIS — I10 Essential (primary) hypertension: Secondary | ICD-10-CM | POA: Diagnosis not present

## 2024-03-31 DIAGNOSIS — Z955 Presence of coronary angioplasty implant and graft: Secondary | ICD-10-CM | POA: Diagnosis not present

## 2024-03-31 DIAGNOSIS — Z95828 Presence of other vascular implants and grafts: Secondary | ICD-10-CM | POA: Diagnosis not present

## 2024-03-31 DIAGNOSIS — I2510 Atherosclerotic heart disease of native coronary artery without angina pectoris: Secondary | ICD-10-CM | POA: Diagnosis not present

## 2024-03-31 DIAGNOSIS — I70221 Atherosclerosis of native arteries of extremities with rest pain, right leg: Principal | ICD-10-CM | POA: Insufficient documentation

## 2024-03-31 DIAGNOSIS — Z7901 Long term (current) use of anticoagulants: Secondary | ICD-10-CM | POA: Insufficient documentation

## 2024-03-31 DIAGNOSIS — M79606 Pain in leg, unspecified: Secondary | ICD-10-CM | POA: Diagnosis present

## 2024-03-31 HISTORY — PX: LOWER EXTREMITY INTERVENTION: CATH118252

## 2024-03-31 HISTORY — PX: PERIPHERAL VASCULAR ATHERECTOMY: CATH118256

## 2024-03-31 HISTORY — PX: LOWER EXTREMITY ANGIOGRAPHY: CATH118251

## 2024-03-31 LAB — POCT ACTIVATED CLOTTING TIME
Activated Clotting Time: 153 s
Activated Clotting Time: 193 s
Activated Clotting Time: 273 s

## 2024-03-31 SURGERY — LOWER EXTREMITY ANGIOGRAPHY
Anesthesia: LOCAL

## 2024-03-31 MED ORDER — MIDAZOLAM HCL 2 MG/2ML IJ SOLN
INTRAMUSCULAR | Status: AC
  Filled 2024-03-31: qty 2

## 2024-03-31 MED ORDER — NITROGLYCERIN 1 MG/10 ML FOR IR/CATH LAB
INTRA_ARTERIAL | Status: DC | PRN
  Administered 2024-03-31 (×2): 300 ug via INTRA_ARTERIAL

## 2024-03-31 MED ORDER — ONDANSETRON HCL 4 MG/2ML IJ SOLN: 4.0000 mg | Freq: Four times a day (QID) | INTRAMUSCULAR | Status: DC | PRN

## 2024-03-31 MED ORDER — HEPARIN (PORCINE) IN NACL 1000-0.9 UT/500ML-% IV SOLN
INTRAVENOUS | Status: DC | PRN
  Administered 2024-03-31 (×2): 500 mL

## 2024-03-31 MED ORDER — LIDOCAINE HCL (PF) 1 % IJ SOLN
INTRAMUSCULAR | Status: AC
  Filled 2024-03-31: qty 30

## 2024-03-31 MED ORDER — CLOPIDOGREL BISULFATE 75 MG PO TABS
75.0000 mg | ORAL_TABLET | Freq: Every day | ORAL | 1 refills | Status: AC
  Filled 2024-03-31: qty 30, 30d supply, fill #0

## 2024-03-31 MED ORDER — HEPARIN SODIUM (PORCINE) 1000 UNIT/ML IJ SOLN
INTRAMUSCULAR | Status: DC | PRN
  Administered 2024-03-31: 3000 [IU] via INTRAVENOUS
  Administered 2024-03-31: 5000 [IU] via INTRAVENOUS
  Administered 2024-03-31: 6000 [IU] via INTRAVENOUS
  Administered 2024-03-31: 8000 [IU] via INTRAVENOUS

## 2024-03-31 MED ORDER — HEPARIN SODIUM (PORCINE) 1000 UNIT/ML IJ SOLN
INTRAMUSCULAR | Status: AC
Start: 2024-03-31 — End: 2024-03-31
  Filled 2024-03-31: qty 20

## 2024-03-31 MED ORDER — IODIXANOL 320 MG/ML IV SOLN
INTRAVENOUS | Status: DC | PRN
  Administered 2024-03-31: 85 mL via INTRA_ARTERIAL

## 2024-03-31 MED ORDER — SODIUM CHLORIDE 0.9% FLUSH: 3.0000 mL | INTRAVENOUS | Status: DC | PRN

## 2024-03-31 MED ORDER — NITROGLYCERIN 1 MG/10 ML FOR IR/CATH LAB
INTRA_ARTERIAL | Status: AC
Start: 2024-03-31 — End: 2024-04-01
  Filled 2024-03-31: qty 10

## 2024-03-31 MED ORDER — ACETAMINOPHEN 325 MG PO TABS
650.0000 mg | ORAL_TABLET | ORAL | Status: DC | PRN
  Administered 2024-04-01: 650 mg via ORAL
  Filled 2024-03-31: qty 2

## 2024-03-31 MED ORDER — RIVAROXABAN 20 MG PO TABS: 20.0000 mg | ORAL_TABLET | Freq: Every day | ORAL | Status: DC

## 2024-03-31 MED ORDER — SODIUM CHLORIDE 0.9% FLUSH: 3.0000 mL | Freq: Two times a day (BID) | INTRAVENOUS | Status: DC

## 2024-03-31 MED ORDER — FENTANYL CITRATE (PF) 100 MCG/2ML IJ SOLN
INTRAMUSCULAR | Status: AC
Start: 2024-03-31 — End: 2024-03-31
  Filled 2024-03-31: qty 2

## 2024-03-31 MED ORDER — FREE WATER: 500.0000 mL | Freq: Once | Status: DC

## 2024-03-31 MED ORDER — CLOPIDOGREL BISULFATE 300 MG PO TABS
ORAL_TABLET | ORAL | Status: DC | PRN
  Administered 2024-03-31: 300 mg via ORAL

## 2024-03-31 MED ORDER — SODIUM CHLORIDE 0.9 % IV SOLN: 250.0000 mL | INTRAVENOUS | Status: DC | PRN

## 2024-03-31 MED ORDER — MIDAZOLAM HCL 2 MG/2ML IJ SOLN
INTRAMUSCULAR | Status: DC | PRN
  Administered 2024-03-31 (×2): 2 mg via INTRAVENOUS

## 2024-03-31 MED ORDER — FENTANYL CITRATE (PF) 100 MCG/2ML IJ SOLN
INTRAMUSCULAR | Status: DC | PRN
  Administered 2024-03-31 (×2): 50 ug via INTRAVENOUS
  Administered 2024-03-31 (×2): 25 ug via INTRAVENOUS

## 2024-03-31 MED ORDER — LIDOCAINE HCL (PF) 1 % IJ SOLN
INTRAMUSCULAR | Status: DC | PRN
  Administered 2024-03-31: 10 mL via INTRADERMAL

## 2024-03-31 MED ORDER — CLOPIDOGREL BISULFATE 300 MG PO TABS
ORAL_TABLET | ORAL | Status: AC
  Filled 2024-03-31: qty 1

## 2024-03-31 MED ORDER — ASPIRIN 81 MG PO CHEW: 81.0000 mg | CHEWABLE_TABLET | ORAL | Status: DC

## 2024-03-31 SURGICAL SUPPLY — 20 items
BALLOON STERLING OTW 4X220X150 (BALLOONS) IMPLANT
BALLOON STERLING OTW 5X20X135 (BALLOONS) IMPLANT
BALLOON STERLING OTW 5X80X135 (BALLOONS) IMPLANT
CATH AURYON ATHREC 1.7 (CATHETERS) IMPLANT
CATH CXI 2.3F 135 ST (CATHETERS) IMPLANT
CATH OMNI FLUSH 5F 65CM (CATHETERS) IMPLANT
CLOSURE PERCLOSE PROSTYLE (VASCULAR PRODUCTS) IMPLANT
GUIDEWIRE ASTATO XS 20G 300CM (WIRE) IMPLANT
GUIDEWIRE NITREX 0.018X80X5 (WIRE) IMPLANT
KIT ENCORE 26 ADVANTAGE (KITS) IMPLANT
KIT MICROPUNCTURE NIT STIFF (SHEATH) IMPLANT
SET ATX-X65L (MISCELLANEOUS) IMPLANT
SHEATH CATAPULT 7FR 60 (SHEATH) IMPLANT
SHEATH PINNACLE 5F 10CM (SHEATH) IMPLANT
STENT ZILVER PTX 5X60 (Permanent Stent) IMPLANT
TAPE SHOOT N SEE (TAPE) IMPLANT
TRAY PV CATH (CUSTOM PROCEDURE TRAY) ×2 IMPLANT
WIRE BENTSON .035X145CM (WIRE) IMPLANT
WIRE COMMAND ST STR 014 300 (WIRE) IMPLANT
WIRE HITORQ VERSACORE ST 145CM (WIRE) IMPLANT

## 2024-03-31 NOTE — Progress Notes (Signed)
 Duirng assessment, it was noted that patient had new drainage to incision site. Pressure was applied, for 10 minutes, bleeding appeared to stop, new dressing was put in place. PT placed in flat position, and closely monitored. When patients HOB was elevated to about 20 degrees, dressing was reviewed again and drainage was noted to be present. Pressure was applied again, this time for 20 minutes and MD was made aware. Via MD a pressure dressing was applied pt ambulated in the hallway and pt started to bleed from incision site. MD was contacted again, and new orders to admit pt were placed. PT currently waiting for bed placement.

## 2024-03-31 NOTE — Discharge Instructions (Signed)
 Femoral Site Care This sheet gives you information about how to care for yourself after your procedure. Your health care provider may also give you more specific instructions. If you have problems or questions, contact your health care provider. What can I expect after the procedure?  After the procedure, it is common to have: Bruising that usually fades within 1-2 weeks. Tenderness at the site. Follow these instructions at home: Wound care Follow instructions from your health care provider about how to take care of your insertion site. Make sure you: Wash your hands with soap and water before you change your bandage (dressing). If soap and water are not available, use hand sanitizer. Remove your dressing as told by your health care provider. In 24 hours Do not take baths, swim, or use a hot tub until your health care provider approves. You may shower 24-48 hours after the procedure or as told by your health care provider. Gently wash the site with plain soap and water. Pat the area dry with a clean towel. Do not rub the site. This may cause bleeding. Do not apply powder or lotion to the site. Keep the site clean and dry. Check your femoral site every day for signs of infection. Check for: Redness, swelling, or pain. Fluid or blood. Warmth. Pus or a bad smell. Activity For the first 2-3 days after your procedure, or as long as directed: Avoid climbing stairs as much as possible. Do not squat. Do not lift anything that is heavier than 10 lb (4.5 kg), or the limit that you are told, until your health care provider says that it is safe. For 5 days Rest as directed. Avoid sitting for a long time without moving. Get up to take short walks every 1-2 hours. Do not drive for 24 hours if you were given a medicine to help you relax (sedative). General instructions Take over-the-counter and prescription medicines only as told by your health care provider. Keep all follow-up visits as told by  your health care provider. This is important. Contact a health care provider if you have: A fever or chills. You have redness, swelling, or pain around your insertion site. Get help right away if: The catheter insertion area swells very fast. You pass out. You suddenly start to sweat or your skin gets clammy. The catheter insertion area is bleeding, and the bleeding does not stop when you hold steady pressure on the area. The area near or just beyond the catheter insertion site becomes pale, cool, tingly, or numb. These symptoms may represent a serious problem that is an emergency. Do not wait to see if the symptoms will go away. Get medical help right away. Call your local emergency services (911 in the [place] Do not drive yourself to the hospital. Summary After the procedure, it is common to have bruising that usually fades within 1-2 weeks. Check your femoral site every day for signs of infection. Do not lift anything that is heavier than 10 lb (4.5 kg), or the limit that you are told, until your health care provider says that it is safe. This information is not intended to replace advice given to you by your health care provider. Make sure you discuss any questions you have with your health care provider. Document Revised: 08/17/2017 Document Reviewed: 08/17/2017 Elsevier Patient Education  2020 [employer].

## 2024-03-31 NOTE — Interval H&P Note (Signed)
 History and Physical Interval Note:  03/31/2024 1:08 PM  [name] [name]  has presented today for surgery, with the diagnosis of pad.  The various methods of treatment have been discussed with the patient and family. After consideration of risks, benefits and other options for treatment, the patient has consented to  Procedure(s): Lower Extremity Angiography (N/A) and possible angioplasty of the Right distal SFA and popliteal artery as a surgical intervention.  The patient's history has been reviewed, patient examined, no change in status, stable for surgery.  I have reviewed the patient's chart and labs.  Questions were answered to the patient's satisfaction.     Gordy Bergamo

## 2024-04-01 ENCOUNTER — Encounter (HOSPITAL_COMMUNITY)

## 2024-04-01 ENCOUNTER — Encounter (HOSPITAL_COMMUNITY): Payer: Self-pay | Admitting: Cardiology

## 2024-04-01 ENCOUNTER — Other Ambulatory Visit: Payer: Self-pay | Admitting: Physician Assistant

## 2024-04-01 ENCOUNTER — Other Ambulatory Visit (HOSPITAL_COMMUNITY): Payer: Self-pay

## 2024-04-01 DIAGNOSIS — F17210 Nicotine dependence, cigarettes, uncomplicated: Secondary | ICD-10-CM | POA: Diagnosis not present

## 2024-04-01 DIAGNOSIS — Z79899 Other long term (current) drug therapy: Secondary | ICD-10-CM | POA: Diagnosis not present

## 2024-04-01 DIAGNOSIS — Z86711 Personal history of pulmonary embolism: Secondary | ICD-10-CM | POA: Diagnosis not present

## 2024-04-01 DIAGNOSIS — I10 Essential (primary) hypertension: Secondary | ICD-10-CM | POA: Diagnosis not present

## 2024-04-01 DIAGNOSIS — I70221 Atherosclerosis of native arteries of extremities with rest pain, right leg: Secondary | ICD-10-CM | POA: Diagnosis not present

## 2024-04-01 DIAGNOSIS — Z7901 Long term (current) use of anticoagulants: Secondary | ICD-10-CM | POA: Diagnosis not present

## 2024-04-01 DIAGNOSIS — I2510 Atherosclerotic heart disease of native coronary artery without angina pectoris: Secondary | ICD-10-CM | POA: Diagnosis not present

## 2024-04-01 DIAGNOSIS — Z955 Presence of coronary angioplasty implant and graft: Secondary | ICD-10-CM | POA: Diagnosis not present

## 2024-04-01 DIAGNOSIS — D6859 Other primary thrombophilia: Secondary | ICD-10-CM | POA: Diagnosis not present

## 2024-04-01 DIAGNOSIS — E782 Mixed hyperlipidemia: Secondary | ICD-10-CM | POA: Diagnosis not present

## 2024-04-01 DIAGNOSIS — Z8249 Family history of ischemic heart disease and other diseases of the circulatory system: Secondary | ICD-10-CM | POA: Diagnosis not present

## 2024-04-01 DIAGNOSIS — B351 Tinea unguium: Secondary | ICD-10-CM | POA: Diagnosis not present

## 2024-04-01 DIAGNOSIS — Z95828 Presence of other vascular implants and grafts: Secondary | ICD-10-CM | POA: Diagnosis not present

## 2024-04-01 DIAGNOSIS — Z86718 Personal history of other venous thrombosis and embolism: Secondary | ICD-10-CM | POA: Diagnosis not present

## 2024-04-01 DIAGNOSIS — I739 Peripheral vascular disease, unspecified: Secondary | ICD-10-CM

## 2024-04-01 LAB — BASIC METABOLIC PANEL WITH GFR
Anion gap: 9 (ref 5–15)
BUN: 8 mg/dL (ref 8–23)
CO2: 21 mmol/L — ABNORMAL LOW (ref 22–32)
Calcium: 9 mg/dL (ref 8.9–10.3)
Chloride: 107 mmol/L (ref 98–111)
Creatinine, Ser: 0.88 mg/dL (ref 0.44–1.00)
GFR, Estimated: >60 mL/min (ref >60)
Glucose, Bld: 145 mg/dL — ABNORMAL HIGH (ref 70–99)
Potassium: 2.7 mmol/L — CL (ref 3.5–5.1)
Sodium: 137 mmol/L (ref 135–145)

## 2024-04-01 LAB — MAGNESIUM: Magnesium: 1.7 mg/dL (ref 1.7–2.4)

## 2024-04-01 MED ORDER — PANTOPRAZOLE SODIUM 40 MG PO TBEC
40.0000 mg | DELAYED_RELEASE_TABLET | Freq: Every day | ORAL | 0 refills | Status: DC
  Filled 2024-04-01: qty 60, 60d supply, fill #0

## 2024-04-01 MED ORDER — POTASSIUM CHLORIDE CRYS ER 20 MEQ PO TBCR
60.0000 meq | EXTENDED_RELEASE_TABLET | ORAL | Status: AC
  Administered 2024-04-01 (×2): 60 meq via ORAL
  Filled 2024-04-01 (×2): qty 3

## 2024-04-01 MED ORDER — MAGNESIUM SULFATE 2 GM/50ML IV SOLN
2.0000 g | Freq: Once | INTRAVENOUS | Status: AC
  Administered 2024-04-01: 2 g via INTRAVENOUS
  Filled 2024-04-01: qty 50

## 2024-04-01 NOTE — Discharge Summary (Signed)
 Discharge Summary   Patient ID: [name] [name] MRN: [id number]; DOB: 1955-09-07  Admit date: 03/31/2024 Discharge date: 04/01/2024  PCP:  Mercer Clotilda SAUNDERS, MD   [facility] Providers Cardiologist:  Alm Clay, MD  Cardiology APP:  Janene Boer, [place]   Discharge Diagnoses  Principal Problem:   Claudication in peripheral vascular disease [facility])   Diagnostic Studies/Procedures   Right lower extremity angioplasty (distal SFA and popliteal artery) 03/31/2024: Diagnostic peripheral arteriogram on 01/12/2024 [facility] with the following: Right femoral arteriogram with distal runoff: The proximal, mid and mid to distal SFA has mild disease. The distal SFA is occluded proximal to the Supera stent and reconstitutes at the distal popliteal artery below the knee. There is three-vessel runoff after the reconstitution.  Extensive collaterals are noted.   Intervention data: Successful revascularization of the right distal SFA and popliteal artery. Atherectomy using AURYON ATHREC 1.7 Catheter having made 2 passes at 30 and 20mJ/mm, followed by balloon angioplasty with a 4.0 x 220 mm Sterling balloon followed by stenting of the distal SFA with a 5.0 x 60 mm Zilver PTX self-expanding drug-coated stent overlapping the previously placed Supera stent at the distal SFA and Popliteal Artery.  Previously placed stent had focal underexpansion postdilated with 5.0 x 20 mm Sterling balloon at 16 atmospheric pressure in 2 locations, overall stenosis reduced from 100% to 0% with brisk flow established with three-vessel runoff below the knee.     Recommendation: Patient will continue with Xarelto  starting tomorrow as she has primary hypercoagulable state.  She has been loaded with Plavix  300 mg x 1 today, she will continue with 75 mg of Plavix  for 2 months, followed by continued Xarelto  alone.  She will need postprocedure ABI.  Expect significant improvement in symptoms of claudication     ________   History of  Present Illness   [name] [name] is a 68 y.o. female with PMH of tobacco use, HTN, HLD, primary hypercoagulable state with positive lupus anticoagulant with extensive DVT and PE SP endovascular therapy and stenting of the lower extremity venous vasculature and IVC filter placement presently on long-term Xarelto , CAD with LAD and CX stenting in 2020, mildly reduced LVEF at around 45 to 50% on 09/13/2018, severe peripheral arterial disease with moderately reduced bilateral ABI, history of right distal SFA artery stenting on 06/02/2022, history of pseudoaneurysm repair at that time, repeat angiography revealing occluded right popliteal artery and underwent Supera stent placement on 04/07/2023. In view of symptomatic claudication and severe decrease in her ABI, underwent repeat angiogram on 01/12/2024 by me revealing occluded right SFA stent and popliteal artery stent with one-vessel runoff. She was seen in the office on 7/16 with complaints of leg pain with lifestyle-limiting claudication however with exercise therapy. Given this she was set up for outpatient PV angiogram.    Hospital Course    Underwent successful revascularization of the right distal SFA and popliteal artery with atherectomy and balloon angioplasty and stenting overlapping previously placed stent. Previously placed stent with focal under expansion with overall stenosis reduced from 100 to 0%. Observed overnight as she had bleeding from left femoral cath site. No complaints noted. Plan for plavix  and Xarelto  for 2 months then Xarelto  alone.   General: Well developed, well nourished, female appearing in no acute distress. Head: Normocephalic, atraumatic.  Neck: Supple without bruits, JVD. Lungs:  Resp regular and unlabored, CTA. Heart: RRR, S1, S2, no S3, S4, or murmur; no rub. Abdomen: Soft, non-tender, non-distended with normoactive bowel sounds. No  hepatomegaly. No rebound/guarding. No obvious abdominal masses. Extremities: No clubbing,  cyanosis, edema. Distal pedal pulses are 2+ bilaterally. Left femoral cath site stable without bruising or hematoma, dry dressing.  Neuro: Alert and oriented X 3. Moves all extremities spontaneously. Psych: Normal affect. _____________  Discharge Vitals Blood pressure (!) 150/60, pulse 99, temperature 98.2 F (36.8 C), temperature source Oral, resp. rate 18, height 5' 4.5 (1.638 m), weight 59.4 kg, SpO2 98%.  Filed Weights   03/31/24 1117  Weight: 59.4 kg    Labs & Radiologic Studies  CBC No results for input(s): WBC, NEUTROABS, HGB, HCT, MCV, PLT in the last 72 hours. Basic Metabolic Panel No results for input(s): NA, K, CL, CO2, GLUCOSE, BUN, CREATININE, CALCIUM , MG, PHOS in the last 72 hours. Liver Function Tests No results for input(s): AST, ALT, ALKPHOS, BILITOT, PROT, ALBUMIN  in the last 72 hours. No results for input(s): LIPASE, AMYLASE in the last 72 hours. High Sensitivity Troponin:   No results for input(s): TROPONINIHS in the last 720 hours.  No results for input(s): TRNPT in the last 720 hours.  BNP Invalid input(s): POCBNP No results for input(s): PROBNP in the last 72 hours.  No results for input(s): BNP in the last 72 hours.  D-Dimer No results for input(s): DDIMER in the last 72 hours. Hemoglobin A1C No results for input(s): HGBA1C in the last 72 hours. Fasting Lipid Panel No results for input(s): CHOL, HDL, LDLCALC, TRIG, CHOLHDL, LDLDIRECT in the last 72 hours. Lipoprotein (a)  Date/Time Value Ref Range Status  07/19/2020 11:22 AM 22.2 <75.0 nmol/L Final    Comment:    Note:  Values greater than or equal to 75.0 nmol/L may        indicate an independent risk factor for CHD,        but must be evaluated with caution when applied        to non-Caucasian populations due to the        influence of genetic factors on Lp(a) across        ethnicities.     Thyroid  Function Tests No  results for input(s): TSH, T4TOTAL, T3FREE, THYROIDAB in the last 72 hours.  Invalid input(s): FREET3 _____________  PERIPHERAL VASCULAR CATHETERIZATION Result Date: 03/31/2024 Images from the original result were not included. Right lower extremity angioplasty (distal SFA and popliteal artery) 03/31/2024: Diagnostic peripheral arteriogram on 01/12/2024 [facility] with the following: Right femoral arteriogram with distal runoff: The proximal, mid and mid to distal SFA has mild disease. The distal SFA is occluded proximal to the Supera stent and reconstitutes at the distal popliteal artery below the knee. There is three-vessel runoff after the reconstitution.  Extensive collaterals are noted. Intervention data: Successful revascularization of the right distal SFA and popliteal artery. Atherectomy using AURYON ATHREC 1.7 Catheter having made 2 passes at 30 and 34mJ/mm, followed by balloon angioplasty with a 4.0 x 220 mm Sterling balloon followed by stenting of the distal SFA with a 5.0 x 60 mm Zilver PTX self-expanding drug-coated stent overlapping the previously placed Supera stent at the distal SFA and Popliteal Artery.  Previously placed stent had focal underexpansion postdilated with 5.0 x 20 mm Sterling balloon at 16 atmospheric pressure in 2 locations, overall stenosis reduced from 100% to 0% with brisk flow established with three-vessel runoff below the knee.    Recommendation: Patient will continue with Xarelto  starting tomorrow as she has primary hypercoagulable state.  She has been loaded with Plavix  300 mg x 1 today, she  will continue with 75 mg of Plavix  for 2 months, followed by continued Xarelto  alone.  She will need postprocedure ABI.  Expect significant improvement in symptoms of claudication    Disposition Pt is being discharged home today in good condition.  Follow-up Plans & Appointments  Discharge Instructions     Call MD for:  difficulty breathing, headache or visual  disturbances   Complete by: As directed    Call MD for:  redness, tenderness, or signs of infection (pain, swelling, redness, odor or green/yellow discharge around incision site)   Complete by: As directed    Diet - low sodium heart healthy   Complete by: As directed    Discharge instructions   Complete by: As directed    Groin Site Care Refer to this sheet in the next few weeks. These instructions provide you with information on caring for yourself after your procedure. Your caregiver may also give you more specific instructions. Your treatment has been planned according to current medical practices, but problems sometimes occur. Call your caregiver if you have any problems or questions after your procedure. HOME CARE INSTRUCTIONS You may shower 24 hours after the procedure. Remove the bandage (dressing) and gently wash the site with plain soap and water . Gently pat the site dry.  Do not apply powder or lotion to the site.  Do not sit in a bathtub, swimming pool, or whirlpool for 5 to 7 days.  No bending, squatting, or lifting anything over 10 pounds (4.5 kg) as directed by your caregiver.  Inspect the site at least twice daily.  Do not drive home if you are discharged the same day of the procedure. Have someone else drive you.  You may drive 24 hours after the procedure unless otherwise instructed by your caregiver.  What to expect: Any bruising will usually fade within 1 to 2 weeks.  Blood that collects in the tissue (hematoma) may be painful to the touch. It should usually decrease in size and tenderness within 1 to 2 weeks.  SEEK IMMEDIATE MEDICAL CARE IF: You have unusual pain at the groin site or down the affected leg.  You have redness, warmth, swelling, or pain at the groin site.  You have drainage (other than a small amount of blood on the dressing).  You have chills.  You have a fever or persistent symptoms for more than 72 hours.  You have a fever and your symptoms suddenly get  worse.  Your leg becomes pale, cool, tingly, or numb.  You have heavy bleeding from the site. Hold pressure on the site. [name]  PLEASE DO NOT MISS ANY DOSES OF YOUR PLAVIX !!!!! Also keep a log of you blood pressures and bring back to your follow up appt. [name] call the office with any questions.   Patients taking blood thinners should generally stay away from medicines like ibuprofen , Advil , Motrin , naproxen, and Aleve due to risk of stomach bleeding. You may take Tylenol  as directed or talk to your primary doctor about alternatives.   [name] ENSURE THAT YOU DO NOT RUN OUT OF YOUR PLAVIX . This medication is very important to remain on for at least 2 months. IF you have issues obtaining this medication due to cost [name] CALL the office 3-5 business days prior to running out in order to prevent missing doses of this medication.   Increase activity slowly   Complete by: As directed        Discharge Medications Allergies as of 04/01/2024  Reactions   Lipitor [atorvastatin ] Other (See Comments)   Myalgia   Prednisone Other (See Comments)   No energy stalls out.    Makes pt  Jittery. **makes pt have no energy**   Tramadol Other (See Comments), Nausea Only   Constipation,  Makes pt  Jittery. constipation   Crestor  [rosuvastatin  Calcium ]    Feels fatigued, RE  CHALLENGE - UNABLE TO USE 12/28/18   Hctz [hydrochlorothiazide ]    Dizzy   Zithromax [azithromycin] Other (See Comments)   jittery   Chlorhexidine  Rash        Medication List     STOP taking these medications    enoxaparin  100 MG/ML injection Commonly known as: LOVENOX        TAKE these medications    acetaminophen  650 MG CR tablet Commonly known as: TYLENOL  Take 650 mg by mouth every 8 (eight) hours as needed for pain.   amLODipine  2.5 MG tablet Commonly known as: NORVASC  Take 1 tablet (2.5 mg total) by mouth 2 (two) times daily.   bisacodyl  5 MG EC tablet Commonly known as: DULCOLAX Take 5 mg by mouth  daily as needed for moderate constipation.   carvedilol  6.25 MG tablet Commonly known as: COREG  TAKE 0.5 TABLETS (3.125 MG TOTAL) BY MOUTH 2 (TWO) TIMES DAILY WITH A MEAL.   clopidogrel  75 MG tablet Commonly known as: Plavix  Take 1 tablet (75 mg total) by mouth daily.   cyanocobalamin  1000 MCG tablet Commonly known as: VITAMIN B12 Take 1,000 mcg by mouth 3 (three) times a week.   fenofibrate  145 MG tablet Commonly known as: TRICOR  TAKE 1 TABLET (145 MG TOTAL) BY MOUTH DAILY. TAKE WITH A MEAL.   lisinopril  40 MG tablet Commonly known as: ZESTRIL  TAKE 1 TABLET BY MOUTH EVERY DAY   nitroGLYCERIN  0.4 MG SL tablet Commonly known as: NITROSTAT  Place 1 tablet (0.4 mg total) under the tongue every 5 (five) minutes as needed for up to 3 doses for chest pain.   pantoprazole  40 MG tablet Commonly known as: Protonix  Take 1 tablet (40 mg total) by mouth daily. Take while also on clopidogrel  and rivaroxaban    pravastatin  40 MG tablet Commonly known as: PRAVACHOL  Take 1 tablet (40 mg total) by mouth every evening.   Repatha  SureClick 140 MG/ML Soaj Generic drug: Evolocumab  Inject 140 mg into the skin every 14 (fourteen) days.   rivaroxaban  20 MG Tabs tablet Commonly known as: Xarelto  Take 1 tablet (20 mg total) by mouth daily with supper.         Outstanding Labs/Studies  Outpatient dopplers  Duration of Discharge Encounter: APP Time: 15 minutes   Signed, Manuelita Rummer, NP 04/01/2024, 10:35 AM

## 2024-04-01 NOTE — Care Management Obs Status (Signed)
 MEDICARE OBSERVATION STATUS NOTIFICATION   Patient Details  Name: [name] [name] MRN: [id number] Date of Birth: June 13, 1956   Medicare Observation Status Notification Given:  Yes    Vonzell Arrie Sharps 04/01/2024, 9:18 AM

## 2024-04-01 NOTE — Plan of Care (Signed)
 Problem: Education: Goal: Understanding of CV disease, CV risk reduction, and recovery process will improve 04/01/2024 1025 by Lorence Lacey HERO, RN Outcome: Adequate for Discharge 04/01/2024 1025 by Lorence Lacey HERO, RN Outcome: Adequate for Discharge Goal: Individualized Educational Video(s) 04/01/2024 1025 by Lorence Lacey HERO, RN Outcome: Adequate for Discharge 04/01/2024 1025 by Lorence Lacey HERO, RN Outcome: Adequate for Discharge   Problem: Activity: Goal: Ability to return to baseline activity level will improve 04/01/2024 1025 by Lorence Lacey HERO, RN Outcome: Adequate for Discharge 04/01/2024 1025 by Lorence Lacey HERO, RN Outcome: Adequate for Discharge   Problem: Cardiovascular: Goal: Ability to achieve and maintain adequate cardiovascular perfusion will improve 04/01/2024 1025 by Lorence Lacey HERO, RN Outcome: Adequate for Discharge 04/01/2024 1025 by Lorence Lacey HERO, RN Outcome: Adequate for Discharge Goal: Vascular access site(s) Level 0-1 will be maintained 04/01/2024 1025 by Lorence Lacey HERO, RN Outcome: Adequate for Discharge 04/01/2024 1025 by Lorence Lacey HERO, RN Outcome: Adequate for Discharge   Problem: Health Behavior/Discharge Planning: Goal: Ability to safely manage health-related needs after discharge will improve 04/01/2024 1025 by Lorence Lacey HERO, RN Outcome: Adequate for Discharge 04/01/2024 1025 by Lorence Lacey HERO, RN Outcome: Adequate for Discharge   Problem: Education: Goal: Understanding of cardiac disease, CV risk reduction, and recovery process will improve 04/01/2024 1025 by Lorence Lacey HERO, RN Outcome: Adequate for Discharge 04/01/2024 1025 by Lorence Lacey HERO, RN Outcome: Adequate for Discharge Goal: Individualized Educational Video(s) 04/01/2024 1025 by Lorence Lacey HERO, RN Outcome: Adequate for Discharge 04/01/2024 1025 by Lorence Lacey HERO, RN Outcome: Adequate for Discharge   Problem: Activity: Goal: Ability to tolerate increased activity will improve 04/01/2024 1025 by Lorence Lacey HERO, RN Outcome: Adequate for Discharge 04/01/2024 1025 by Lorence Lacey HERO, RN Outcome: Adequate for Discharge   Problem: Cardiac: Goal: Ability to achieve and maintain adequate cardiovascular perfusion will improve 04/01/2024 1025 by Lorence Lacey HERO, RN Outcome: Adequate for Discharge 04/01/2024 1025 by Lorence Lacey HERO, RN Outcome: Adequate for Discharge   Problem: Health Behavior/Discharge Planning: Goal: Ability to safely manage health-related needs after discharge will improve 04/01/2024 1025 by Lorence Lacey HERO, RN Outcome: Adequate for Discharge 04/01/2024 1025 by Lorence Lacey HERO, RN Outcome: Adequate for Discharge   Problem: Education: Goal: Knowledge of General Education information will improve Description: Including pain rating scale, medication(s)/side effects and non-pharmacologic comfort measures 04/01/2024 1025 by Lorence Lacey HERO, RN Outcome: Adequate for Discharge 04/01/2024 1025 by Lorence Lacey HERO, RN Outcome: Adequate for Discharge   Problem: Health Behavior/Discharge Planning: Goal: Ability to manage health-related needs will improve 04/01/2024 1025 by Lorence Lacey HERO, RN Outcome: Adequate for Discharge 04/01/2024 1025 by Lorence Lacey HERO, RN Outcome: Adequate for Discharge   Problem: Clinical Measurements: Goal: Ability to maintain clinical measurements within normal limits will improve 04/01/2024 1025 by Lorence Lacey HERO, RN Outcome: Adequate for Discharge 04/01/2024 1025 by Lorence Lacey HERO, RN Outcome: Adequate for Discharge Goal: Will remain free from infection 04/01/2024 1025 by Lorence Lacey HERO, RN Outcome: Adequate for Discharge 04/01/2024 1025 by Lorence Lacey HERO, RN Outcome: Adequate for Discharge Goal:  Diagnostic test results will improve 04/01/2024 1025 by Lorence Lacey HERO, RN Outcome: Adequate for Discharge 04/01/2024 1025 by Lorence Lacey HERO, RN Outcome: Adequate for Discharge Goal: Respiratory complications will improve 04/01/2024 1025 by Lorence Lacey HERO, RN Outcome: Adequate for Discharge 04/01/2024 1025 by Lorence Lacey HERO, RN Outcome: Adequate for Discharge Goal: Cardiovascular complication will be avoided 04/01/2024 1025 by Lorence Lacey HERO, RN Outcome: Adequate  for Discharge 04/01/2024 1025 by Lorence Lacey HERO, RN Outcome: Adequate for Discharge   Problem: Activity: Goal: Risk for activity intolerance will decrease 04/01/2024 1025 by Lorence Lacey HERO, RN Outcome: Adequate for Discharge 04/01/2024 1025 by Lorence Lacey HERO, RN Outcome: Adequate for Discharge   Problem: Nutrition: Goal: Adequate nutrition will be maintained 04/01/2024 1025 by Lorence Lacey HERO, RN Outcome: Adequate for Discharge 04/01/2024 1025 by Lorence Lacey HERO, RN Outcome: Adequate for Discharge   Problem: Coping: Goal: Level of anxiety will decrease 04/01/2024 1025 by Lorence Lacey HERO, RN Outcome: Adequate for Discharge 04/01/2024 1025 by Lorence Lacey HERO, RN Outcome: Adequate for Discharge   Problem: Elimination: Goal: Will not experience complications related to bowel motility 04/01/2024 1025 by Lorence Lacey HERO, RN Outcome: Adequate for Discharge 04/01/2024 1025 by Lorence Lacey HERO, RN Outcome: Adequate for Discharge Goal: Will not experience complications related to urinary retention 04/01/2024 1025 by Lorence Lacey HERO, RN Outcome: Adequate for Discharge 04/01/2024 1025 by Lorence Lacey HERO, RN Outcome: Adequate for Discharge   Problem: Pain Managment: Goal: General experience of comfort will improve and/or be controlled 04/01/2024 1025 by Lorence Lacey HERO, RN Outcome: Adequate for  Discharge 04/01/2024 1025 by Lorence Lacey HERO, RN Outcome: Adequate for Discharge   Problem: Safety: Goal: Ability to remain free from injury will improve 04/01/2024 1025 by Lorence Lacey HERO, RN Outcome: Adequate for Discharge 04/01/2024 1025 by Lorence Lacey HERO, RN Outcome: Adequate for Discharge   Problem: Skin Integrity: Goal: Risk for impaired skin integrity will decrease 04/01/2024 1025 by Lorence Lacey HERO, RN Outcome: Adequate for Discharge 04/01/2024 1025 by Lorence Lacey HERO, RN Outcome: Adequate for Discharge

## 2024-04-01 NOTE — Progress Notes (Signed)
 Potassium and magnesium  replacement completed. D/c education completed. Pt discharged home with husband.

## 2024-04-01 NOTE — Progress Notes (Signed)
 Pt brought from short stay, VSS, left groin dsg CDI, husband at the bedside. Call bell within reach questions answered

## 2024-04-01 NOTE — Progress Notes (Signed)
 Lab called with critical potassium of 2.7, Morna Rummer NP notified. Received new order for potassium replacement. Patient is upset about the delay. Plan of care continues.

## 2024-04-01 NOTE — Progress Notes (Addendum)
 Magnesium  was 1.7, patient is receiving IV mag replacement . Dose of another potassium is due this afternoon. OK to d/c patient after replacing potassium and magnesium  today per Morna Rummer NP. Plan of care continues.

## 2024-04-04 ENCOUNTER — Encounter (HOSPITAL_COMMUNITY)

## 2024-04-04 ENCOUNTER — Telehealth (HOSPITAL_COMMUNITY): Payer: Self-pay

## 2024-04-04 NOTE — Telephone Encounter (Signed)
 Patient called stating she had a procedure done last week and was told by Dr. Ladona that she needs to hold off on her SET sessions for one week. Patient will return to class on 8/25.

## 2024-04-06 ENCOUNTER — Encounter (HOSPITAL_COMMUNITY)

## 2024-04-08 ENCOUNTER — Encounter (HOSPITAL_COMMUNITY)

## 2024-04-11 ENCOUNTER — Encounter (HOSPITAL_COMMUNITY)

## 2024-04-13 ENCOUNTER — Encounter (HOSPITAL_COMMUNITY)
Admission: RE | Admit: 2024-04-13 | Discharge: 2024-04-13 | Disposition: A | Discharge status: Home / Self Care | Source: Ambulatory Visit | Attending: Cardiology | Admitting: Cardiology

## 2024-04-13 DIAGNOSIS — I70211 Atherosclerosis of native arteries of extremities with intermittent claudication, right leg: Secondary | ICD-10-CM

## 2024-04-13 NOTE — Progress Notes (Signed)
 Daily Session Note  Patient Details  Name: GENIYAH EISCHEID MRN: [id number] Date of Birth: 1956-08-17 Referring Provider:   Conrad Ports Supervised Exercise Therapy from 01/26/2024 in [facility]  Referring Provider Dr. Gordy Schwalbe    Encounter Date: 04/13/2024  Check In:  Session Check In - 04/13/24 1126       Check-In   Supervising physician immediately available to respond to emergencies [facility] MD immediately available    Physician(s) Jackee Alberts NP    Location [facility]    Staff Present Garen Candy, MS, Exercise Physiologist;Jetta Vannie BS, ACSM-CEP, Exercise Physiologist;Olinty Valere, MS, ACSM-CEP, Exercise Physiologist;Maria Whitaker, RN, Valere Music, RN, Marcine Pereyra, MS, Exercise Physiologist;David Makemson, MS, ACSM-CEP, CCRP, Exercise Physiologist    Virtual Visit No    Medication changes reported     Yes    Comments Plavix  added    Fall or balance concerns reported    No    Tobacco Cessation No Change    Warm-up and Cool-down Performed on first and last piece of equipment    Resistance Training Performed No    VAD Patient? No    PAD/SET Patient? Yes      PAD/SET Patient   Completed foot check today? Yes    Open wounds to report? No      Pain Assessment   Currently in Pain? No/denies    Pain Score 0-No pain          Capillary Blood Glucose: No results found for this or any previous visit (from the past 24 hours).   Exercise Prescription Changes - 04/13/24 1100       Response to Exercise   Blood Pressure (Admit) 124/68    Blood Pressure (Exit) 110/54    Heart Rate (Admit) 96 bpm    Heart Rate (Exercise) 101 bpm    Heart Rate (Exit) 96 bpm    Oxygen Saturation (Admit) 96 %    Oxygen Saturation (Exit) 100 %    Rating of Perceived Exertion (Exercise) 13    Perceived Dyspnea (Exercise) 0    Symptoms Moderate claudication    Comments Speed increased to 2.86mph and  incline increased to 3.5%    Duration Progress to 10 minutes continuous walking  at current work load and total walking time to 30-45 min    Intensity THRR unchanged      Progression   Progression Continue to follow PAD protocol    Average METs 3.5      Resistance Training   Training Prescription No      Treadmill   MPH 2    Grade 3.5    Minutes 30    METs 3.5          Social History   Tobacco Use  Smoking Status Some Days   Current packs/day: 0.50   Average packs/day: 0.5 packs/day for 50.7 years (25.3 ttl pk-yrs)   Types: Cigarettes   Start date: 1975  Smokeless Tobacco Never  Tobacco Comments   Cut back to quarter pack a day    Goals Met:  Independence with exercise equipment Exercise tolerated well No report of concerns or symptoms today  Goals Unmet:  Not Applicable  Comments: Pt was cleared for supervised exercise therapy by Dr. Jagadeesh Gangi Due to patients hx of CV disease pt is being watched closely and monitored for any unusual signs/symptoms while exercising. Will continue to watch closely, monitor, and report any unusual or  unexpected pain through the program.  Pt exercised on the Treadmill  for 30 minutes and experienced 2-3/5 claudication pain that resolved with sitting rest breaks. Pt tolerated exercise w/o unusual and unexpected symptoms. Will continue to monitor and progress through the program following PAD protocol.    Initial onset of claudication at: 6:00 Longest exercised time before break: 7:34 Total exercise time/rest time: 30/20  Service time is from 1000 to 1130.    Dr. Wilbert Bihari is Medical Director for Cardiac Rehab at [facility].

## 2024-04-15 ENCOUNTER — Telehealth (HOSPITAL_COMMUNITY): Payer: Self-pay | Admitting: *Deleted

## 2024-04-15 ENCOUNTER — Ambulatory Visit (HOSPITAL_COMMUNITY)

## 2024-04-15 NOTE — Telephone Encounter (Signed)
 [name] [name] left voice mail message informing us  she will not be able to make the exercise session today due to an emergency.

## 2024-04-16 NOTE — Progress Notes (Signed)
 Discharge Progress Report  Patient Details  Name: [name] [name] MRN: [id number] Date of Birth: June 06, 1956 Referring Provider:   Conrad Ports Supervised Exercise Therapy from 01/26/2024 in [facility]  Referring Provider Dr. Gordy Schwalbe     Number of Visits: 16  Reason for Discharge:  Pt has reached the 12-week limit of SET  Smoking History:  Social History   Tobacco Use  Smoking Status Some Days   Current packs/day: 0.50   Average packs/day: 0.5 packs/day for 50.7 years (25.3 ttl pk-yrs)   Types: Cigarettes   Start date: 1975  Smokeless Tobacco Never  Tobacco Comments   Cut back to quarter pack a day    Diagnosis:  Atherosclerosis of native arteries of extremities with intermittent claudication, right leg (HCC)  ADL UCSD:   Initial Exercise Prescription:  Initial Exercise Prescription - 01/26/24 1200       Date of Initial Exercise RX and Referring Provider   Date 01/26/24    Referring Provider Dr. Gordy Schwalbe    Expected Discharge Date 04/18/24      Treadmill   MPH 1.7    Grade 2    Minutes 25    METs 2.8      Prescription Details   Frequency (times per week) 3 days/week      Intensity   THRR 40-80% of Max Heartrate 61-122    Ratings of Perceived Exertion 11-13    Perceived Dyspnea 0-4      Progression   Progression Continue to follow PAD protocol      Resistance Training   Training Prescription Yes    Weight 3    Reps 10-15          Discharge Exercise Prescription (Final Exercise Prescription Changes):  Exercise Prescription Changes - 04/13/24 1100       Response to Exercise   Blood Pressure (Admit) 124/68    Blood Pressure (Exit) 110/54    Heart Rate (Admit) 96 bpm    Heart Rate (Exercise) 101 bpm    Heart Rate (Exit) 96 bpm    Oxygen Saturation (Admit) 96 %    Oxygen Saturation (Exit) 100 %    Rating of Perceived Exertion (Exercise) 13    Perceived Dyspnea (Exercise) 0     Symptoms Moderate claudication    Comments Speed increased to 2.11mph and incline increased to 3.5%    Duration Progress to 10 minutes continuous walking  at current work load and total walking time to 30-45 min    Intensity THRR unchanged      Progression   Progression Continue to follow PAD protocol    Average METs 3.5      Resistance Training   Training Prescription No      Treadmill   MPH 2    Grade 3.5    Minutes 30    METs 3.5          Functional Capacity:   Psychological, QOL, Others - Outcomes: PHQ 2/9:    12/31/2023   10:15 AM 06/01/2023    5:47 PM 03/16/2023    3:48 PM 10/27/2022    9:43 AM 10/31/2021    8:33 AM  Depression screen PHQ 2/9  Decreased Interest 0 0 0 0 0  Down, Depressed, Hopeless 0 0 0 0 0  PHQ - 2 Score 0 0 0 0 0  Altered sleeping   0    Tired, decreased energy   0  Change in appetite   0    Feeling bad or failure about yourself    0    Trouble concentrating   0    Moving slowly or fidgety/restless   0    Suicidal thoughts   0    PHQ-9 Score   0      Quality of Life:  Quality of Life - 01/26/24 1204       Quality of Life   Select PAD/SET Quality of Life      PAD/SET Quality of Life Scores   Social Relationships and Interactions Pre 33    Self-Concepts and Feelings Pre 24    Symptoms and Limitations Pre 17    Fear and Uncertainty Pre 12    Positive Adaptation Pre 34    Job Pre 4    Sex Pre 6    Intimate Relationships Pre 6          Personal Goals: Goals established at orientation with interventions provided to work toward goal.  Personal Goals and Risk Factors at Admission - 01/26/24 1227       Core Components/Risk Factors/Patient Goals on Admission    Weight Management Weight Loss    Tobacco Cessation Yes    Intervention Assist the participant in steps to quit. Provide individualized education and counseling about committing to Tobacco Cessation, relapse prevention, and pharmacological support that can be provided by  physician.;Education officer, environmental, assist with locating and accessing local/national Quit Smoking programs, and support quit date choice.    Expected Outcomes Short Term: Will demonstrate readiness to quit, by selecting a quit date.;Long Term: Complete abstinence from all tobacco products for at least 12 months from quit date.;Short Term: Will quit all tobacco product use, adhering to prevention of relapse plan.    Hypertension Yes    Intervention Provide education on lifestyle modifcations including regular physical activity/exercise, weight management, moderate sodium restriction and increased consumption of fresh fruit, vegetables, and low fat dairy, alcohol moderation, and smoking cessation.;Monitor prescription use compliance.    Expected Outcomes Short Term: Continued assessment and intervention until BP is < 140/78mm HG in hypertensive participants. < 130/42mm HG in hypertensive participants with diabetes, heart failure or chronic kidney disease.;Long Term: Maintenance of blood pressure at goal levels.    Lipids Yes    Intervention Provide education and support for participant on nutrition & aerobic/resistive exercise along with prescribed medications to achieve LDL 70mg , HDL >40mg .    Expected Outcomes Short Term: Participant states understanding of desired cholesterol values and is compliant with medications prescribed. Participant is following exercise prescription and nutrition guidelines.;Long Term: Cholesterol controlled with medications as prescribed, with individualized exercise RX and with personalized nutrition plan. Value goals: LDL < 70mg , HDL > 40 mg.           Personal Goals Discharge:   Exercise Goals and Review:  Exercise Goals     Row Name 01/26/24 1219             Exercise Goals   Increase Physical Activity Yes       Intervention Provide advice, education, support and counseling about physical activity/exercise needs.;Develop an individualized exercise  prescription for aerobic and resistive training based on initial evaluation findings, risk stratification, comorbidities and participant's personal goals.       Expected Outcomes Short Term: Attend rehab on a regular basis to increase amount of physical activity.;Long Term: Add in home exercise to make exercise part of routine and to increase amount of physical activity.;Long  Term: Exercising regularly at least 3-5 days a week.       Increase Strength and Stamina Yes       Intervention Provide advice, education, support and counseling about physical activity/exercise needs.;Develop an individualized exercise prescription for aerobic and resistive training based on initial evaluation findings, risk stratification, comorbidities and participant's personal goals.       Expected Outcomes Short Term: Increase workloads from initial exercise prescription for resistance, speed, and METs.;Long Term: Improve cardiorespiratory fitness, muscular endurance and strength as measured by increased METs and functional capacity ( );Short Term: Perform resistance training exercises routinely during rehab and add in resistance training at home       Able to understand and use rate of perceived exertion (RPE) scale Yes       Intervention Provide education and explanation on how to use RPE scale       Expected Outcomes Short Term: Able to use RPE daily in rehab to express subjective intensity level;Long Term:  Able to use RPE to guide intensity level when exercising independently       Knowledge and understanding of Target Heart Rate Range (THRR) Yes       Intervention Provide education and explanation of THRR including how the numbers were predicted and where they are located for reference       Expected Outcomes Short Term: Able to state/look up THRR;Long Term: Able to use THRR to govern intensity when exercising independently;Short Term: Able to use daily as guideline for intensity in rehab       Understanding of  Exercise Prescription Yes       Intervention Provide education, explanation, and written materials on patient's individual exercise prescription       Expected Outcomes Short Term: Able to explain program exercise prescription;Long Term: Able to explain home exercise prescription to exercise independently       Improve claudication pain toleration; Improve walking ability Yes       Intervention Participate in PAD/SET Rehab 2-3 days a week, walking at home as part of exercise prescription;Attend education sessions to aid in risk factor modification and understanding of disease process       Expected Outcomes Long Term: Improve walking ability and toleration to claudication;Long Term: Improve score of PAD questionnaires;Short Term: Improve walking distance/time to onset of claudication pain          Exercise Goals Re-Evaluation:  Exercise Goals Re-Evaluation     Row Name 02/11/24 0754             Exercise Goal Re-Evaluation   Exercise Goals Review --       Comments --       Expected Outcomes --          Nutrition & Weight - Outcomes:  Pre Biometrics - 01/26/24 1032       Pre Biometrics   Waist Circumference 37.5 inches    Hip Circumference 38.5 inches    Waist to Hip Ratio 0.97 %    Triceps Skinfold 19 mm    % Body Fat 35.7 %    Grip Strength 14 kg    Flexibility 9.5 in    Single Leg Stand 3.68 seconds          [name] [name] completed her last session on 04/13/24. Due to numerous factors I was unable to complete post rehab measurements including TM walk test, PAD questionnaire,  WIQ, and body measurements. [name] [name] continues to experience mobility limiting claudication in her L calf when walking per her  last exercise session. Due to her lifestyle limiting claudication and no plans for surgical intervention I do recommend another 36 sessions of SET especially in the context of her missing 20 session d/t R toe pain and surgical restrictions following PCI to R SFA. Pt is interested in doing  SET again however we will have ensure that it is medically necessary per her referring physician and she has insurance coverage. Will continue to follow.  Garen FORBES Candy MS, ACSM-CEP 04/16/2024 8:23 AM

## 2024-04-18 ENCOUNTER — Other Ambulatory Visit: Payer: Self-pay | Admitting: Cardiology

## 2024-04-19 ENCOUNTER — Ambulatory Visit (HOSPITAL_COMMUNITY)

## 2024-04-20 ENCOUNTER — Ambulatory Visit (HOSPITAL_BASED_OUTPATIENT_CLINIC_OR_DEPARTMENT_OTHER)
Admit: 2024-04-20 | Discharge: 2024-04-20 | Disposition: A | Discharge status: Home / Self Care | Attending: Physician Assistant | Admitting: Physician Assistant

## 2024-04-20 ENCOUNTER — Ambulatory Visit (HOSPITAL_COMMUNITY)
Admission: RE | Admit: 2024-04-20 | Discharge: 2024-04-20 | Disposition: A | Discharge status: Home / Self Care | Source: Ambulatory Visit | Attending: Physician Assistant | Admitting: Physician Assistant

## 2024-04-20 DIAGNOSIS — I739 Peripheral vascular disease, unspecified: Secondary | ICD-10-CM

## 2024-04-20 LAB — VAS US ABI WITH/WO TBI
Left ABI: 0.77
Right ABI: 1.24

## 2024-04-21 ENCOUNTER — Ambulatory Visit: Payer: Self-pay | Admitting: Physician Assistant

## 2024-04-26 ENCOUNTER — Telehealth: Payer: Self-pay | Admitting: Acute Care

## 2024-04-26 DIAGNOSIS — Z87891 Personal history of nicotine dependence: Secondary | ICD-10-CM

## 2024-04-26 DIAGNOSIS — F17210 Nicotine dependence, cigarettes, uncomplicated: Secondary | ICD-10-CM

## 2024-04-26 DIAGNOSIS — Z122 Encounter for screening for malignant neoplasm of respiratory organs: Secondary | ICD-10-CM

## 2024-04-26 NOTE — Telephone Encounter (Signed)
 Lung Cancer Screening Narrative/Criteria Questionnaire (Cigarette Smokers Only- No Cigars/Pipes/vapes)   [name]   SDMV:05/05/2024 8:30a [name] [name]         October 11, 1955   LDCT: 05/06/2024 11:30a [facility]    67 y.o.   Phone: [phone]  Lung Screening Narrative (confirm age 54-77 yrs Medicare / 50-80 yrs Private pay insurance)   Insurance information:[employer] mcr   Referring Provider:Dr. Luke   This screening involves an initial phone call with a team member from our program. It is called a shared decision making visit. The initial meeting is required by  insurance and Medicare to make sure you understand the program. This appointment takes about 15-20 minutes to complete. You will complete the screening scan at your scheduled date/time.  This scan takes about 5-10 minutes to complete. You can eat and drink normally before and after the scan.  Criteria questions for Lung Cancer Screening:   Are you a current or former smoker? Current Age began smoking: 68yo   If you are a former smoker, what year did you quit smoking? Quit smoking for a year then started back again(within 15 yrs)   To calculate your smoking history, I need an accurate estimate of how many packs of cigarettes you smoked per day and for how many years. (Not just the number of PPD you are now smoking)   Years smoking 47 x Packs per day 1 = Pack years 47   (at least 20 pack yrs)   (Make sure they understand that we need to know how much they have smoked in the past, not just the number of PPD they are smoking now)  Do you have a personal history of cancer?  No    Do you have a family history of cancer? No  Are you coughing up blood?  No  Have you had unexplained weight loss of 15 lbs or more in the last 6 months? No  It looks like you meet all criteria.  When would be a good time for us  to schedule you for this screening?   Additional information: N/A

## 2024-04-28 DIAGNOSIS — S29009A Unspecified injury of muscle and tendon of unspecified wall of thorax, initial encounter: Secondary | ICD-10-CM | POA: Diagnosis not present

## 2024-04-28 DIAGNOSIS — Z981 Arthrodesis status: Secondary | ICD-10-CM | POA: Diagnosis not present

## 2024-04-28 DIAGNOSIS — R0789 Other chest pain: Secondary | ICD-10-CM | POA: Diagnosis not present

## 2024-04-29 ENCOUNTER — Other Ambulatory Visit: Payer: Self-pay

## 2024-04-29 ENCOUNTER — Ambulatory Visit: Admitting: Cardiology

## 2024-04-29 MED ORDER — LISINOPRIL 40 MG PO TABS: 40.0000 mg | ORAL_TABLET | Freq: Every day | ORAL | 3 refills | Status: AC

## 2024-05-04 ENCOUNTER — Encounter: Payer: Self-pay | Admitting: Cardiovascular Disease

## 2024-05-04 ENCOUNTER — Ambulatory Visit: Attending: Cardiology | Admitting: Cardiovascular Disease

## 2024-05-04 VITALS — BP 142/58 | HR 74 | Ht 64.5 in | Wt 138.0 lb

## 2024-05-04 DIAGNOSIS — I739 Peripheral vascular disease, unspecified: Secondary | ICD-10-CM | POA: Diagnosis not present

## 2024-05-04 DIAGNOSIS — E785 Hyperlipidemia, unspecified: Secondary | ICD-10-CM | POA: Diagnosis not present

## 2024-05-04 DIAGNOSIS — E782 Mixed hyperlipidemia: Secondary | ICD-10-CM | POA: Diagnosis not present

## 2024-05-04 NOTE — Patient Instructions (Signed)
 Medication Instructions:  Your physician recommends that you continue on your current medications as directed. Please refer to the Current Medication list given to you today.  *If you need a refill on your cardiac medications before your next appointment, please call your pharmacy*  Lab Work: Your physician recommends that you return for lab work in: 3 months for FASTING lipid/liver panel (right before office visit with Melissa)  If you have labs (blood work) drawn today and your tests are completely normal, you will receive your results only by: MyChart Message (if you have MyChart) OR A paper copy in the mail If you have any lab test that is abnormal or we need to change your treatment, we will call you to review the results.   Follow-Up: At [facility], you and your health needs are our priority.  As part of our continuing mission to provide you with exceptional heart care, our providers are all part of one team.  This team includes your primary Cardiologist (physician) and Advanced Practice Providers or APPs (Physician Assistants and Nurse Practitioners) who all work together to provide you with the care you need, when you need it.  Your next appointment:   6 month(s)  Provider:   Dorn Lesches, MD   Other Instructions

## 2024-05-04 NOTE — Assessment & Plan Note (Signed)
 Ms. [name] [name] returns for postprocedure follow-up.  She has had right SFA vein intervention by myself twice in the past most recently 03/19/2023.  Because of recurrent symptoms and worsening Dopplers I restudied her 01/12/2024 revealing an occluded distal right SFA stent.  I asked Dr. Ladona to attempt reintervention which she did on 03/31/2024.  He crossed and performed laser atherectomy with a 1.7 mm catheter then performed DCB and Zilver self-expanding stenting.  He had an excellent result.  Her claudication resolved and her Dopplers performed 04/20/2024 normalized.  She is on Xarelto  for hypercoagulable state and clopidogrel  for a total of 3 months.

## 2024-05-04 NOTE — Progress Notes (Signed)
 05/04/2024 [name] [name]   [name] [name]  [id number]  Primary Physician [name] Clotilda SAUNDERS, MD Primary Cardiologist: [name] JINNY Lesches MD [place], [name] [name], [place]  HPI:  [name] is a 68 y.o.   mild to moderately overweight married Caucasian female mother of 1, grandmother of 2 grandchildren referred by [name] Ford, PA-C for peripheral vascular valuation.  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  I last saw her in the office 02/02/2024.  Her risk factors include ongoing tobacco abuse 1/2 pack/day, 11/17/3.  She is accompanied by her husband [name] [name] today.  Hypertension and hyperlipidemia.  She does have CAD status post LAD and circumflex intervention back in January 2020.  She is never had a stroke.  She does have lupus anticoagulant with extensive lower extremity DVT and pulmonary emboli status post endovascular therapy with stenting of her lower extremity venous vasculature as well as placement of an IVC filter.  She was on Coumadin  for some time and transition to Xarelto .  When she was on triple therapy for her LAD and circumflex stent she had GI bleeding.  Aspirin  was discontinued.  She was not tolerant to Plavix .  She does complain of bilateral calf claudication over the last year with Dopplers performed 02/27/2022 revealing a right ABI of 0.57, left of 0.52.  She had high-grade lesions in her distal right SFA and popliteal artery and an occluded left SFA.   I performed peripheral angiography intervention 06/02/2022.  I performed [facility] 1 directional arthrectomy followed by [facility] of 8 short segment CTO in the mid right SFA.  She also had a mid left SFA CTO.  She was discharged home the following day and was readmitted that Friday with pain in her left groin, CT showed hematoma, Doppler showed pseudoaneurysm.  She underwent surgical evacuation and correction of the pseudoaneurysm by [name] [name].  She just saw [name] [name] in the office last week who  noted that the incision was dry with some surrounding edema.  She does have some paresthesias down the medial aspect of her left thigh.  Her claudication has resolved and her right SFA remained widely patent by duplex ultrasound 06/12/2022.   When I saw her on July 5 of this year she was complaining of recurrent claudication over the last 3 to 4 weeks with Doppler study performed 02/16/2023 revealed a decline in her right ABI to 0.48 with an occlusion of her right popliteal artery.  Our plans are to proceed with angiography and reintervention.  The major issue is going to be Lovenox  bridging and careful attention to her left groin given her previous pseudoaneurysm formation.   I performed peripheral angiography on her 03/19/2023 revealing a distal right SFA CTO.  I performed PTA and stenting using a SUPERA self-expanding stent.  She was discharged home 2 days later.  Her groin is remained stable.  Follow-up Doppler studies performed 03/30/2023 revealed marked improvement in her ABI on the right up to 1.06 with a widely patent stent.  Her claudication has resolved.   Since I saw her 3 months ago she has had recurrent symptoms with tingling numbness and pain in her right calf.  She had Dopplers performed 01/01/2024 which revealed a decline in her right ABI from 1.06 down to 0.44.  Her right SFA stent has occluded.  She is clinically symptomatic.   I asked Dr. Ladona to perform angiography which was performed 01/12/2024 revealing an occluded right SFA stent  consistent with her Doppler studies.  She is currently involved in vascular rehab and walks 3 days a week.  She does complain of lifestyle-limiting claudication.  Dr. Ladona performed peripheral intervention on her 03/31/2024 using laser atherectomy, DCB and Zilver drug-eluting stent.  She was discharged home the following day.  Dopplers performed 04/20/2024 showed normal ABI with a widely patent SFA.  Her claudication has resolved.  She is on clopidogrel  for total of 3  months and Xarelto  indefinitely.  Current Meds  Medication Sig   acetaminophen  (TYLENOL ) 650 MG CR tablet Take 650 mg by mouth every 8 (eight) hours as needed for pain.   amLODipine  (NORVASC ) 2.5 MG tablet Take 1 tablet (2.5 mg total) by mouth 2 (two) times daily.   bisacodyl  (DULCOLAX) 5 MG EC tablet Take 5 mg by mouth daily as needed for moderate constipation.   carvedilol  (COREG ) 6.25 MG tablet TAKE 0.5 TABLETS (3.125 MG TOTAL) BY MOUTH 2 (TWO) TIMES DAILY WITH A MEAL.   clopidogrel  (PLAVIX ) 75 MG tablet Take 1 tablet (75 mg total) by mouth daily.   cyanocobalamin  (VITAMIN B12) 1000 MCG tablet Take 1,000 mcg by mouth 3 (three) times a week.   Evolocumab  (REPATHA  SURECLICK) 140 MG/ML SOAJ Inject 140 mg into the skin every 14 (fourteen) days.   fenofibrate  (TRICOR ) 145 MG tablet TAKE 1 TABLET (145 MG TOTAL) BY MOUTH DAILY. TAKE WITH A MEAL.   lisinopril  (ZESTRIL ) 40 MG tablet Take 1 tablet (40 mg total) by mouth daily.   nitroGLYCERIN  (NITROSTAT ) 0.4 MG SL tablet Place 1 tablet (0.4 mg total) under the tongue every 5 (five) minutes as needed for up to 3 doses for chest pain.   pravastatin  (PRAVACHOL ) 40 MG tablet TAKE 1 TABLET BY MOUTH EVERY DAY IN THE EVENING   rivaroxaban  (XARELTO ) 20 MG TABS tablet Take 1 tablet (20 mg total) by mouth daily with supper.     Allergies  Allergen Reactions   Lipitor [Atorvastatin ] Other (See Comments)    Myalgia   Prednisone Other (See Comments)    No energy stalls out.    Makes pt  Jittery.  **makes pt have no energy**   Tramadol Other (See Comments) and Nausea Only    Constipation,  Makes pt  Jittery.  constipation   Crestor  [Rosuvastatin  Calcium ]     Feels fatigued, RE  CHALLENGE - UNABLE TO USE 12/28/18   Hctz [Hydrochlorothiazide ]     Dizzy    Zithromax [Azithromycin] Other (See Comments)    jittery   Chlorhexidine  Rash    Social History   Socioeconomic History   Marital status: Married    Spouse name: Not on file   Number of  children: 2   Years of education: Not on file   Highest education level: Bachelor's degree (e.g., BA, AB, BS)  Occupational History   Occupation: SERVICE MANAGER    Employer: [employer]  Tobacco Use   Smoking status: Every Day    Current packs/day: 0.50    Average packs/day: 0.5 packs/day for 50.7 years (25.4 ttl pk-yrs)    Types: Cigarettes    Start date: 1975   Smokeless tobacco: Never   Tobacco comments:    Cut back to quarter pack a day  Vaping Use   Vaping status: Never Used  Substance and Sexual Activity   Alcohol use: Yes    Comment: Occasional   Drug use: No   Sexual activity: Not on file  Other Topics Concern   Not on file  Social  History Narrative   Really is a married mother of 2.  1 son died from complications of spina bifida after multiple surgeries.   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     She has a has a Scientist, water quality in education.     Drinks social alcohol & denies drug use.     Pt endorses smoking maybe 2 cigarettes/day.  States she placed most of the cigarette, may have 2 puffs.   Social Drivers of Corporate investment banker Strain: Low Risk  (12/28/2023)   Overall Financial Resource Strain (CARDIA)    Difficulty of Paying Living Expenses: Not hard at all  Food Insecurity: No Food Insecurity (04/28/2024)   Received from [facility]   Hunger Vital Sign    Within the past 12 months, you worried that your food would run out before you got the money to buy more.: Never true    Within the past 12 months, the food you bought just didn't last and you didn't have money to get more.: Never true  Transportation Needs: No Transportation Needs (04/28/2024)   [facility] from [facility]   PRAPARE - Transportation    Lack of Transportation (Medical): No    Lack of Transportation (Non-Medical): No  Physical Activity: Insufficiently Active (12/28/2023)   Exercise Vital Sign    Days of Exercise per Week: 2 days     Minutes of Exercise per Session: 20 min  Stress: No Stress Concern Present (12/28/2023)   [employer] of Occupational Health - Occupational Stress Questionnaire    Feeling of Stress : Not at all  Social Connections: Socially Integrated (04/01/2024)   Social Connection and Isolation Panel    Frequency of Communication with Friends and Family: More than three times a week    Frequency of Social Gatherings with Friends and Family: More than three times a week    Attends Religious Services: More than 4 times per year    Active Member of [employer] or Organizations: Yes    Attends Engineer, structural: More than 4 times per year    Marital Status: Married  Catering manager Violence: Not At Risk (04/01/2024)   Humiliation, Afraid, Rape, and Kick questionnaire    Fear of Current or Ex-Partner: No    Emotionally Abused: No    Physically Abused: No    Sexually Abused: No     Review of Systems: General: negative for chills, fever, night sweats or weight changes.  Cardiovascular: negative for chest pain, dyspnea on exertion, edema, orthopnea, palpitations, paroxysmal nocturnal dyspnea or shortness of breath Dermatological: negative for rash Respiratory: negative for cough or wheezing Urologic: negative for hematuria Abdominal: negative for nausea, vomiting, diarrhea, bright red blood per rectum, melena, or hematemesis Neurologic: negative for visual changes, syncope, or dizziness All other systems reviewed and are otherwise negative except as noted above.    Blood pressure (!) 142/58, pulse 74, height 5' 4.5 (1.638 m), weight 138 lb (62.6 kg), SpO2 90%.  General appearance: alert and no distress Neck: no adenopathy, no carotid bruit, no JVD, supple, symmetrical, trachea midline, and thyroid  not enlarged, symmetric, no tenderness/mass/nodules Lungs: clear to auscultation bilaterally Heart: regular rate and rhythm, S1, S2 normal, no murmur, click, rub or gallop Extremities:  extremities normal, atraumatic, no cyanosis or edema Pulses: 2+ and symmetric Skin: Skin color, texture, turgor normal. No rashes or lesions Neurologic: Grossly normal  EKG not performed today  ASSESSMENT AND PLAN:   Claudication in peripheral vascular disease (HCC) Ms. Taketa returns for postprocedure follow-up.  She has had right SFA vein intervention by myself twice in the past most recently 03/19/2023.  Because of recurrent symptoms and worsening Dopplers I restudied her 01/12/2024 revealing an occluded distal right SFA stent.  I asked Dr. Ladona to attempt reintervention which she did on 03/31/2024.  He crossed and performed laser atherectomy with a 1.7 mm catheter then performed DCB and Zilver self-expanding stenting.  He had an excellent result.  Her claudication resolved and her Dopplers performed 04/20/2024 normalized.  She is on Xarelto  for hypercoagulable state and clopidogrel  for a total of 3 months.     [name] DOROTHA Lesches MD FACP,FACC,FAHA, [facility] 05/04/2024 1:44 PM

## 2024-05-05 ENCOUNTER — Ambulatory Visit: Admitting: *Deleted

## 2024-05-05 ENCOUNTER — Encounter: Payer: Self-pay | Admitting: *Deleted

## 2024-05-05 DIAGNOSIS — F17210 Nicotine dependence, cigarettes, uncomplicated: Secondary | ICD-10-CM

## 2024-05-05 NOTE — Patient Instructions (Signed)

## 2024-05-05 NOTE — Progress Notes (Signed)
   Virtual Visit via Telephone Note  I connected with [name] [name] on 05/05/24 at  8:30 AM EDT by telephone and verified that I am speaking with the correct person using two identifiers.  Location: Patient: at home Provider: [address], [place], [place], [address]    I discussed the limitations, risks, security and privacy concerns of performing an evaluation and management service by telephone and the availability of in person appointments. I also discussed with the patient that there may be a patient responsible charge related to this service. The patient expressed understanding and agreed to proceed.   Shared Decision Making Visit Lung Cancer Screening Program [phone])   Eligibility: Age 68 y.o. Pack Years Smoking History Calculation 47 (# packs/per year x # years smoked) Recent History of coughing up blood  no Unexplained weight loss? no ( >Than 15 pounds within the last 6 months ) Prior History Lung / other cancer no (Diagnosis within the last 5 years already requiring surveillance chest CT Scans). Smoking Status Current Smoker Former Smokers: Years since quit: n/a  Quit Date: n/a  Visit Components: Discussion included one or more decision making aids. yes Discussion included risk/benefits of screening. yes Discussion included potential follow up diagnostic testing for abnormal scans. yes Discussion included meaning and risk of over diagnosis. yes Discussion included meaning and risk of False Positives. yes Discussion included meaning of total radiation exposure. yes  Counseling Included: Importance of adherence to annual lung cancer LDCT screening. yes Impact of comorbidities on ability to participate in the program. yes Ability and willingness to under diagnostic treatment. yes  Smoking Cessation Counseling: Current Smokers:  Discussed importance of smoking cessation. yes Information about tobacco cessation classes and interventions provided to patient.  yes Patient provided with ticket for LDCT Scan. no Symptomatic Patient. no  Counseling(Intermediate counseling: > three minutes) [id number] Diagnosis Code: Tobacco Use Z72.0 Asymptomatic Patient yes  Counseling   Counseled patient 4 minutes regarding tobacco use.   Former Smokers:  Discussed the importance of maintaining cigarette abstinence. yes Diagnosis Code: Personal History of Nicotine  Dependence. S12.108 Information about tobacco cessation classes and interventions provided to patient. Yes Patient provided with ticket for LDCT Scan. no Written Order for Lung Cancer Screening with LDCT placed in Epic. Yes (CT Chest Lung Cancer Screening Low Dose W/O CM) [id number] Z12.2-Screening of respiratory organs Z87.891-Personal history of nicotine  dependence   Laneta Speaks, RN

## 2024-05-06 ENCOUNTER — Ambulatory Visit (HOSPITAL_COMMUNITY)
Admission: RE | Admit: 2024-05-06 | Discharge: 2024-05-06 | Disposition: A | Discharge status: Home / Self Care | Source: Ambulatory Visit | Attending: Acute Care | Admitting: Acute Care

## 2024-05-06 DIAGNOSIS — Z122 Encounter for screening for malignant neoplasm of respiratory organs: Secondary | ICD-10-CM | POA: Diagnosis not present

## 2024-05-06 DIAGNOSIS — F17210 Nicotine dependence, cigarettes, uncomplicated: Secondary | ICD-10-CM | POA: Diagnosis not present

## 2024-05-06 DIAGNOSIS — Z87891 Personal history of nicotine dependence: Secondary | ICD-10-CM | POA: Diagnosis not present

## 2024-05-10 ENCOUNTER — Telehealth: Payer: Self-pay | Admitting: Cardiovascular Disease

## 2024-05-10 MED ORDER — CARVEDILOL 6.25 MG PO TABS: 3.1250 mg | ORAL_TABLET | Freq: Two times a day (BID) | ORAL | 2 refills | Status: DC

## 2024-05-10 NOTE — Telephone Encounter (Signed)
 Pt needing a refill on her carvedilol .  [facility] had informed her it was denied by this office.  Advised I will send refill as requested and she is to call the pharmacy when she is ready to have int filled.  She reports she has enough currently for 16 days.

## 2024-05-10 NOTE — Telephone Encounter (Signed)
 Pt c/o medication issue:  1. Name of Medication:   carvedilol  (COREG ) 6.25 MG tablet    2. How are you currently taking this medication (dosage and times per day)?  TAKE 0.5 TABLETS (3.125 MG TOTAL) BY MOUTH 2 (TWO) TIMES DAILY WITH A MEAL.       3. Are you having a reaction (difficulty breathing--STAT)? No  4. What is your medication issue? Pt is requesting a callback regarding the refill requesting [facility] sent [facility] for this medication being denied. Pt was seen in office recently and wasn't sure why it was being denied by MD. Please advise

## 2024-05-16 ENCOUNTER — Telehealth: Payer: Self-pay

## 2024-05-16 ENCOUNTER — Telehealth: Payer: Self-pay | Admitting: Acute Care

## 2024-05-16 NOTE — Telephone Encounter (Signed)
 3 month follow up on this is fine. Apparently there is a film that the radiologist looked at where this was 5.5 mm, now it is 11.1 x 9.1.  I have no idea how long ago the radiologist saw the previous scan. I cannot find it.   Left lower lobe pulmonary nodule with mean diameter of 10.5 mm, series 3, image 213, 11.1 x 9.1 mm in biaxial dimension. This retrospectively measured 5 x 5 mm on remote CT. Margins are microlobulated. 4.5 mm nodule in the posterior left upper lobe series 3, image 161. 4 mm left lower lobe nodule series 3, image 247. Pleural effusion.  Follow up due 07/2024. Please fax results to pcp and let them know plan for care. Please ask her if she has had any weight loss or blood in her sputum. If she has we will PET it now. Thanks so much.

## 2024-05-16 NOTE — Telephone Encounter (Signed)
 Call report from Tiffany  IMPRESSION: 1. Lung-RADS 4A, suspicious. Follow up low-dose chest CT without contrast in 3 months (please use the following order, CT CHEST LCS NODULE FOLLOW-UP W/O CM) is recommended. Alternatively, PET may be considered when there is a solid component 8mm or larger. 10.5 mm mean diameter left lower lobe nodule. 2. Coronary artery calcifications. Aortic Atherosclerosis (ICD10-I70.0) and Emphysema (ICD10-J43.9).

## 2024-05-17 ENCOUNTER — Other Ambulatory Visit: Payer: Self-pay

## 2024-05-17 DIAGNOSIS — F17210 Nicotine dependence, cigarettes, uncomplicated: Secondary | ICD-10-CM

## 2024-05-17 DIAGNOSIS — R911 Solitary pulmonary nodule: Secondary | ICD-10-CM

## 2024-05-17 DIAGNOSIS — Z87891 Personal history of nicotine dependence: Secondary | ICD-10-CM

## 2024-05-17 DIAGNOSIS — Z122 Encounter for screening for malignant neoplasm of respiratory organs: Secondary | ICD-10-CM

## 2024-05-17 NOTE — Telephone Encounter (Signed)
 See provider note 05/16/2024

## 2024-05-17 NOTE — Telephone Encounter (Addendum)
 Spoke with patient and review recent lung CT results. She is in agreement to complete a 3 month follow up scan, scheduled for 08/05/2024. Order placed. She denies recent unexplained weight loss or coughing up blood. Results and plan to PCP.

## 2024-05-22 ENCOUNTER — Other Ambulatory Visit: Payer: Self-pay | Admitting: Cardiovascular Disease

## 2024-05-26 ENCOUNTER — Encounter: Payer: Self-pay | Admitting: Family Medicine

## 2024-06-08 ENCOUNTER — Other Ambulatory Visit (HOSPITAL_COMMUNITY): Payer: Self-pay

## 2024-06-16 ENCOUNTER — Other Ambulatory Visit: Payer: Self-pay | Admitting: Cardiovascular Disease

## 2024-06-20 NOTE — Telephone Encounter (Signed)
 Spoke with patient regarding issue of scheduling. Patient is needing a late appointment due to her husbands work schedule and they would like to come together since they live far away. Explained to patient we are currently only scheduling flu shot appts on Wednesday afternoons until 2:50pm. Patient was understanding. Advised patient of the scheduling call center and if she has any problems in the future to please send a message through Mychart so that writer can take a look at the need. Patient voiced understanding and thanked clinical research associate for the call.

## 2024-06-22 ENCOUNTER — Ambulatory Visit

## 2024-06-27 ENCOUNTER — Encounter: Payer: Self-pay | Admitting: Pharmacist

## 2024-06-27 DIAGNOSIS — D6862 Lupus anticoagulant syndrome: Secondary | ICD-10-CM

## 2024-06-27 DIAGNOSIS — E7849 Other hyperlipidemia: Secondary | ICD-10-CM

## 2024-06-27 DIAGNOSIS — Z7902 Long term (current) use of antithrombotics/antiplatelets: Secondary | ICD-10-CM

## 2024-07-04 ENCOUNTER — Ambulatory Visit: Admitting: Pharmacist

## 2024-08-05 ENCOUNTER — Ambulatory Visit (HOSPITAL_COMMUNITY)
Admission: RE | Admit: 2024-08-05 | Discharge: 2024-08-05 | Disposition: A | Discharge status: Home / Self Care | Source: Ambulatory Visit | Attending: Acute Care | Admitting: Acute Care

## 2024-08-05 DIAGNOSIS — N200 Calculus of kidney: Secondary | ICD-10-CM | POA: Insufficient documentation

## 2024-08-05 DIAGNOSIS — J439 Emphysema, unspecified: Secondary | ICD-10-CM | POA: Diagnosis not present

## 2024-08-05 DIAGNOSIS — R911 Solitary pulmonary nodule: Secondary | ICD-10-CM | POA: Insufficient documentation

## 2024-08-05 DIAGNOSIS — F17210 Nicotine dependence, cigarettes, uncomplicated: Secondary | ICD-10-CM | POA: Insufficient documentation

## 2024-08-05 DIAGNOSIS — I700 Atherosclerosis of aorta: Secondary | ICD-10-CM | POA: Diagnosis not present

## 2024-08-05 DIAGNOSIS — Z122 Encounter for screening for malignant neoplasm of respiratory organs: Secondary | ICD-10-CM | POA: Diagnosis not present

## 2024-08-05 DIAGNOSIS — Z87891 Personal history of nicotine dependence: Secondary | ICD-10-CM

## 2024-08-05 DIAGNOSIS — I2510 Atherosclerotic heart disease of native coronary artery without angina pectoris: Secondary | ICD-10-CM | POA: Insufficient documentation

## 2024-08-05 LAB — LIPID PANEL

## 2024-08-06 LAB — COMPREHENSIVE METABOLIC PANEL WITH GFR
ALT: 11 IU/L (ref 0–32)
AST: 17 IU/L (ref 0–40)
Albumin: 4.4 g/dL (ref 3.9–4.9)
Alkaline Phosphatase: 60 IU/L (ref 49–135)
BUN/Creatinine Ratio: 11 — AB (ref 12–28)
BUN: 11 mg/dL (ref 8–27)
Bilirubin Total: 0.3 mg/dL (ref 0.0–1.2)
CO2: 22 mmol/L (ref 20–29)
Calcium: 10 mg/dL (ref 8.7–10.3)
Chloride: 100 mmol/L (ref 96–106)
Creatinine, Ser: 1.02 mg/dL — AB (ref 0.57–1.00)
Globulin, Total: 2.8 g/dL (ref 1.5–4.5)
Glucose: 85 mg/dL (ref 70–99)
Potassium: 4 mmol/L (ref 3.5–5.2)
Sodium: 137 mmol/L (ref 134–144)
Total Protein: 7.2 g/dL (ref 6.0–8.5)
eGFR: 60 mL/min/1.73

## 2024-08-06 LAB — CBC
Hematocrit: 38.2 % (ref 34.0–46.6)
Hemoglobin: 11.8 g/dL (ref 11.1–15.9)
MCH: 27.1 pg (ref 26.6–33.0)
MCHC: 30.9 g/dL — ABNORMAL LOW (ref 31.5–35.7)
MCV: 88 fL (ref 79–97)
Platelets: 555 x10E3/uL — ABNORMAL HIGH (ref 150–450)
RBC: 4.36 x10E6/uL (ref 3.77–5.28)
RDW: 13.3 % (ref 11.7–15.4)
WBC: 8.9 x10E3/uL (ref 3.4–10.8)

## 2024-08-06 LAB — LIPID PANEL
Cholesterol, Total: 141 mg/dL (ref 100–199)
HDL: 46 mg/dL
LDL CALC COMMENT:: 3.1 ratio (ref 0.0–4.4)
LDL Chol Calc (NIH): 68 mg/dL (ref 0–99)
Triglycerides: 159 mg/dL — AB (ref 0–149)
VLDL Cholesterol Cal: 27 mg/dL (ref 5–40)

## 2024-08-06 LAB — APOLIPOPROTEIN B: Apolipoprotein B: 70 mg/dL

## 2024-08-08 ENCOUNTER — Ambulatory Visit: Payer: Self-pay | Admitting: Pharmacist

## 2024-08-15 ENCOUNTER — Telehealth: Payer: Self-pay

## 2024-08-15 NOTE — Telephone Encounter (Signed)
 Called patient to review recent Lung CT results. No answer, LVM.   IMPRESSION: 1. 10.6 mm anterior left lower lobe nodule is unchanged. Lung-RADS 3, probably benign findings. Short-term follow-up in 6 months is recommended with repeat low-dose chest CT without contrast (please use the following order, CT CHEST LCS NODULE FOLLOW-UP W/O CM). 2. Small left renal stone. 3. Aortic atherosclerosis (ICD10-I70.0). Coronary artery calcification. 4.  Emphysema (ICD10-J43.9).

## 2024-08-16 ENCOUNTER — Other Ambulatory Visit: Payer: Self-pay

## 2024-08-16 DIAGNOSIS — F17210 Nicotine dependence, cigarettes, uncomplicated: Secondary | ICD-10-CM

## 2024-08-16 DIAGNOSIS — Z87891 Personal history of nicotine dependence: Secondary | ICD-10-CM

## 2024-08-16 DIAGNOSIS — R911 Solitary pulmonary nodule: Secondary | ICD-10-CM

## 2024-08-16 DIAGNOSIS — Z122 Encounter for screening for malignant neoplasm of respiratory organs: Secondary | ICD-10-CM

## 2024-08-16 NOTE — Telephone Encounter (Signed)
 Spoke with patient and reviewed recent Lung CT results. She is in agreement to a 6 month f/u scan to evaluate nodule stability of a 10.6 mm nodule. Order placed. Reminder set. Results and plan to PCP.

## 2024-08-31 ENCOUNTER — Other Ambulatory Visit: Payer: Self-pay

## 2024-09-15 ENCOUNTER — Other Ambulatory Visit: Payer: Self-pay
